# Patient Record
Sex: Female | Born: 1955
Health system: Southern US, Community
[De-identification: ages and names within clinical notes are randomized; demographics above are authoritative.]

## PROBLEM LIST (undated history)

## (undated) ENCOUNTER — Ambulatory Visit (HOSPITAL_COMMUNITY): Source: Home / Self Care

## (undated) DIAGNOSIS — R000 Tachycardia, unspecified: Secondary | ICD-10-CM

## (undated) DIAGNOSIS — Z9481 Bone marrow transplant status: Secondary | ICD-10-CM

## (undated) DIAGNOSIS — C801 Malignant (primary) neoplasm, unspecified: Secondary | ICD-10-CM

## (undated) DIAGNOSIS — E039 Hypothyroidism, unspecified: Secondary | ICD-10-CM

## (undated) DIAGNOSIS — I2510 Atherosclerotic heart disease of native coronary artery without angina pectoris: Secondary | ICD-10-CM

## (undated) DIAGNOSIS — E669 Obesity, unspecified: Secondary | ICD-10-CM

## (undated) DIAGNOSIS — Z8659 Personal history of other mental and behavioral disorders: Secondary | ICD-10-CM

## (undated) DIAGNOSIS — S83207A Unspecified tear of unspecified meniscus, current injury, left knee, initial encounter: Secondary | ICD-10-CM

## (undated) DIAGNOSIS — R0989 Other specified symptoms and signs involving the circulatory and respiratory systems: Secondary | ICD-10-CM

## (undated) DIAGNOSIS — N3010 Interstitial cystitis (chronic) without hematuria: Principal | ICD-10-CM

## (undated) DIAGNOSIS — M25561 Pain in right knee: Secondary | ICD-10-CM

## (undated) DIAGNOSIS — M899 Disorder of bone, unspecified: Secondary | ICD-10-CM

## (undated) DIAGNOSIS — M1990 Unspecified osteoarthritis, unspecified site: Secondary | ICD-10-CM

## (undated) DIAGNOSIS — R1310 Dysphagia, unspecified: Secondary | ICD-10-CM

## (undated) DIAGNOSIS — Z87828 Personal history of other (healed) physical injury and trauma: Secondary | ICD-10-CM

## (undated) DIAGNOSIS — Z853 Personal history of malignant neoplasm of breast: Secondary | ICD-10-CM

## (undated) DIAGNOSIS — T7840XA Allergy, unspecified, initial encounter: Secondary | ICD-10-CM

## (undated) DIAGNOSIS — Z9221 Personal history of antineoplastic chemotherapy: Secondary | ICD-10-CM

## (undated) DIAGNOSIS — Z923 Personal history of irradiation: Secondary | ICD-10-CM

## (undated) DIAGNOSIS — J189 Pneumonia, unspecified organism: Secondary | ICD-10-CM

## (undated) DIAGNOSIS — F444 Conversion disorder with motor symptom or deficit: Secondary | ICD-10-CM

## (undated) DIAGNOSIS — Z8719 Personal history of other diseases of the digestive system: Secondary | ICD-10-CM

## (undated) DIAGNOSIS — Z8739 Personal history of other diseases of the musculoskeletal system and connective tissue: Secondary | ICD-10-CM

## (undated) DIAGNOSIS — K047 Periapical abscess without sinus: Principal | ICD-10-CM

## (undated) DIAGNOSIS — Z860100 Personal history of colon polyps, unspecified: Secondary | ICD-10-CM

## (undated) DIAGNOSIS — M25562 Pain in left knee: Secondary | ICD-10-CM

## (undated) DIAGNOSIS — F418 Other specified anxiety disorders: Secondary | ICD-10-CM

## (undated) DIAGNOSIS — K589 Irritable bowel syndrome without diarrhea: Secondary | ICD-10-CM

## (undated) DIAGNOSIS — J45909 Unspecified asthma, uncomplicated: Secondary | ICD-10-CM

## (undated) DIAGNOSIS — Q269 Congenital malformation of great vein, unspecified: Secondary | ICD-10-CM

## (undated) DIAGNOSIS — I428 Other cardiomyopathies: Secondary | ICD-10-CM

## (undated) HISTORY — DX: Personal history of malignant neoplasm of breast: Z85.3

## (undated) HISTORY — DX: Hypothyroidism, unspecified: E03.9

## (undated) HISTORY — DX: Pain in left knee: M25.562

## (undated) HISTORY — DX: Interstitial cystitis (chronic) without hematuria: N30.10

## (undated) HISTORY — DX: Congenital malformation of great vein, unspecified: Q26.9

## (undated) HISTORY — DX: Tachycardia, unspecified: R00.0

## (undated) HISTORY — PX: BREAST EXCISIONAL BIOPSY: SUR124

## (undated) HISTORY — DX: Other specified symptoms and signs involving the circulatory and respiratory systems: R09.89

## (undated) HISTORY — DX: Other cardiomyopathies: I42.8

## (undated) HISTORY — PX: TRANSTHORACIC ECHOCARDIOGRAM: SHX275

## (undated) HISTORY — PX: ELECTROPHYSIOLOGY STUDY: SHX5802

## (undated) HISTORY — DX: Periapical abscess without sinus: K04.7

## (undated) HISTORY — DX: Obesity, unspecified: E66.9

## (undated) HISTORY — DX: Atherosclerotic heart disease of native coronary artery without angina pectoris: I25.10

## (undated) HISTORY — DX: Unspecified asthma, uncomplicated: J45.909

## (undated) HISTORY — DX: Disorder of bone, unspecified: M89.9

## (undated) HISTORY — DX: Pain in right knee: M25.561

## (undated) HISTORY — DX: Dysphagia, unspecified: R13.10

## (undated) HISTORY — DX: Other specified anxiety disorders: F41.8

## (undated) HISTORY — DX: Allergy, unspecified, initial encounter: T78.40XA

---

## 1987-06-29 HISTORY — PX: CERVICAL CONIZATION W/BX: SHX1330

## 1992-06-28 DIAGNOSIS — C50919 Malignant neoplasm of unspecified site of unspecified female breast: Secondary | ICD-10-CM

## 1992-06-28 HISTORY — PX: BREAST LUMPECTOMY: SHX2

## 1992-06-28 HISTORY — PX: KNEE ARTHROSCOPY: SUR90

## 1992-06-28 HISTORY — PX: OTHER SURGICAL HISTORY: SHX169

## 1992-06-28 HISTORY — DX: Malignant neoplasm of unspecified site of unspecified female breast: C50.919

## 1993-06-28 HISTORY — PX: BONE MARROW TRANSPLANT: SHX200

## 1997-08-21 ENCOUNTER — Ambulatory Visit (HOSPITAL_COMMUNITY): Admission: RE | Admit: 1997-08-21 | Discharge: 1997-08-21 | Payer: Self-pay | Admitting: Obstetrics and Gynecology

## 1998-01-07 ENCOUNTER — Ambulatory Visit (HOSPITAL_COMMUNITY): Admission: RE | Admit: 1998-01-07 | Discharge: 1998-01-07 | Payer: Self-pay | Admitting: Oncology

## 1998-02-10 ENCOUNTER — Ambulatory Visit (HOSPITAL_COMMUNITY): Admission: RE | Admit: 1998-02-10 | Discharge: 1998-02-10 | Payer: Self-pay | Admitting: Gastroenterology

## 1998-02-19 ENCOUNTER — Ambulatory Visit (HOSPITAL_COMMUNITY): Admission: RE | Admit: 1998-02-19 | Discharge: 1998-02-19 | Payer: Self-pay | Admitting: Obstetrics and Gynecology

## 1998-10-31 ENCOUNTER — Ambulatory Visit (HOSPITAL_COMMUNITY): Admission: RE | Admit: 1998-10-31 | Discharge: 1998-10-31 | Payer: Self-pay | Admitting: Psychology

## 1998-12-08 ENCOUNTER — Other Ambulatory Visit: Admission: RE | Admit: 1998-12-08 | Discharge: 1998-12-08 | Payer: Self-pay | Admitting: Obstetrics and Gynecology

## 1999-02-18 ENCOUNTER — Ambulatory Visit (HOSPITAL_BASED_OUTPATIENT_CLINIC_OR_DEPARTMENT_OTHER): Admission: RE | Admit: 1999-02-18 | Discharge: 1999-02-18 | Payer: Self-pay | Admitting: General Surgery

## 1999-02-18 ENCOUNTER — Encounter: Payer: Self-pay | Admitting: Gastroenterology

## 1999-08-27 ENCOUNTER — Other Ambulatory Visit: Admission: RE | Admit: 1999-08-27 | Discharge: 1999-08-27 | Payer: Self-pay | Admitting: Obstetrics and Gynecology

## 2000-01-11 ENCOUNTER — Other Ambulatory Visit: Admission: RE | Admit: 2000-01-11 | Discharge: 2000-01-11 | Payer: Self-pay | Admitting: Obstetrics and Gynecology

## 2000-09-14 ENCOUNTER — Encounter: Admission: RE | Admit: 2000-09-14 | Discharge: 2000-09-14 | Payer: Self-pay | Admitting: Internal Medicine

## 2000-09-14 ENCOUNTER — Encounter: Payer: Self-pay | Admitting: Internal Medicine

## 2000-11-16 ENCOUNTER — Ambulatory Visit (HOSPITAL_COMMUNITY): Admission: RE | Admit: 2000-11-16 | Discharge: 2000-11-16 | Payer: Self-pay | Admitting: Gastroenterology

## 2001-04-05 ENCOUNTER — Encounter: Payer: Self-pay | Admitting: Internal Medicine

## 2001-04-05 ENCOUNTER — Encounter: Admission: RE | Admit: 2001-04-05 | Discharge: 2001-04-05 | Payer: Self-pay | Admitting: Internal Medicine

## 2001-08-11 ENCOUNTER — Encounter: Admission: RE | Admit: 2001-08-11 | Discharge: 2001-08-11 | Payer: Self-pay | Admitting: Oncology

## 2001-08-11 ENCOUNTER — Encounter (HOSPITAL_COMMUNITY): Admission: RE | Admit: 2001-08-11 | Discharge: 2001-09-10 | Payer: Self-pay | Admitting: Oncology

## 2001-08-28 ENCOUNTER — Other Ambulatory Visit: Admission: RE | Admit: 2001-08-28 | Discharge: 2001-08-28 | Payer: Self-pay | Admitting: Obstetrics and Gynecology

## 2001-11-30 ENCOUNTER — Encounter: Payer: Self-pay | Admitting: Internal Medicine

## 2001-11-30 ENCOUNTER — Ambulatory Visit (HOSPITAL_COMMUNITY): Admission: RE | Admit: 2001-11-30 | Discharge: 2001-11-30 | Payer: Self-pay | Admitting: Internal Medicine

## 2002-03-06 ENCOUNTER — Emergency Department (HOSPITAL_COMMUNITY): Admission: EM | Admit: 2002-03-06 | Discharge: 2002-03-06 | Payer: Self-pay | Admitting: Emergency Medicine

## 2002-03-12 ENCOUNTER — Emergency Department (HOSPITAL_COMMUNITY): Admission: EM | Admit: 2002-03-12 | Discharge: 2002-03-12 | Payer: Self-pay | Admitting: Emergency Medicine

## 2002-04-05 ENCOUNTER — Ambulatory Visit (HOSPITAL_COMMUNITY): Admission: RE | Admit: 2002-04-05 | Discharge: 2002-04-05 | Payer: Self-pay | Admitting: Obstetrics and Gynecology

## 2002-04-05 ENCOUNTER — Encounter (INDEPENDENT_AMBULATORY_CARE_PROVIDER_SITE_OTHER): Payer: Self-pay | Admitting: Specialist

## 2002-04-05 HISTORY — PX: OTHER SURGICAL HISTORY: SHX169

## 2002-04-26 ENCOUNTER — Ambulatory Visit (HOSPITAL_COMMUNITY): Admission: RE | Admit: 2002-04-26 | Discharge: 2002-04-26 | Payer: Self-pay | Admitting: Internal Medicine

## 2002-05-07 ENCOUNTER — Ambulatory Visit (HOSPITAL_BASED_OUTPATIENT_CLINIC_OR_DEPARTMENT_OTHER): Admission: RE | Admit: 2002-05-07 | Discharge: 2002-05-07 | Payer: Self-pay | Admitting: General Surgery

## 2002-05-07 ENCOUNTER — Encounter (INDEPENDENT_AMBULATORY_CARE_PROVIDER_SITE_OTHER): Payer: Self-pay | Admitting: Specialist

## 2002-05-07 HISTORY — PX: BREAST BIOPSY: SHX20

## 2002-06-17 ENCOUNTER — Emergency Department (HOSPITAL_COMMUNITY): Admission: EM | Admit: 2002-06-17 | Discharge: 2002-06-17 | Payer: Self-pay | Admitting: Emergency Medicine

## 2002-06-17 ENCOUNTER — Encounter: Payer: Self-pay | Admitting: Emergency Medicine

## 2002-07-19 ENCOUNTER — Ambulatory Visit (HOSPITAL_COMMUNITY): Admission: RE | Admit: 2002-07-19 | Discharge: 2002-07-19 | Payer: Self-pay | Admitting: Obstetrics and Gynecology

## 2002-07-19 ENCOUNTER — Encounter: Payer: Self-pay | Admitting: Obstetrics and Gynecology

## 2002-08-20 ENCOUNTER — Encounter: Admission: RE | Admit: 2002-08-20 | Discharge: 2002-08-20 | Payer: Self-pay | Admitting: Oncology

## 2002-08-20 ENCOUNTER — Encounter (HOSPITAL_COMMUNITY): Admission: RE | Admit: 2002-08-20 | Discharge: 2002-09-19 | Payer: Self-pay | Admitting: Oncology

## 2003-04-24 ENCOUNTER — Encounter: Admission: RE | Admit: 2003-04-24 | Discharge: 2003-04-24 | Payer: Self-pay | Admitting: Oncology

## 2003-04-24 ENCOUNTER — Encounter (HOSPITAL_COMMUNITY): Admission: RE | Admit: 2003-04-24 | Discharge: 2003-05-24 | Payer: Self-pay | Admitting: Oncology

## 2003-06-20 ENCOUNTER — Ambulatory Visit (HOSPITAL_COMMUNITY): Admission: RE | Admit: 2003-06-20 | Discharge: 2003-06-20 | Payer: Self-pay | Admitting: Neurology

## 2003-06-30 ENCOUNTER — Emergency Department (HOSPITAL_COMMUNITY): Admission: EM | Admit: 2003-06-30 | Discharge: 2003-06-30 | Payer: Self-pay | Admitting: Emergency Medicine

## 2003-07-10 ENCOUNTER — Ambulatory Visit (HOSPITAL_COMMUNITY): Admission: RE | Admit: 2003-07-10 | Discharge: 2003-07-10 | Payer: Self-pay | Admitting: Oncology

## 2003-07-22 ENCOUNTER — Other Ambulatory Visit: Admission: RE | Admit: 2003-07-22 | Discharge: 2003-07-22 | Payer: Self-pay | Admitting: Obstetrics and Gynecology

## 2003-08-05 ENCOUNTER — Encounter (INDEPENDENT_AMBULATORY_CARE_PROVIDER_SITE_OTHER): Payer: Self-pay | Admitting: Specialist

## 2003-08-05 ENCOUNTER — Ambulatory Visit (HOSPITAL_COMMUNITY): Admission: RE | Admit: 2003-08-05 | Discharge: 2003-08-05 | Payer: Self-pay | Admitting: Gastroenterology

## 2003-08-13 ENCOUNTER — Other Ambulatory Visit: Admission: RE | Admit: 2003-08-13 | Discharge: 2003-08-13 | Payer: Self-pay | Admitting: *Deleted

## 2003-08-28 ENCOUNTER — Encounter (HOSPITAL_COMMUNITY): Admission: RE | Admit: 2003-08-28 | Discharge: 2003-09-27 | Payer: Self-pay | Admitting: Oncology

## 2003-08-28 ENCOUNTER — Encounter: Admission: RE | Admit: 2003-08-28 | Discharge: 2003-08-28 | Payer: Self-pay | Admitting: Oncology

## 2004-04-06 ENCOUNTER — Encounter: Admission: RE | Admit: 2004-04-06 | Discharge: 2004-04-06 | Payer: Self-pay | Admitting: Oncology

## 2004-04-06 ENCOUNTER — Encounter (HOSPITAL_COMMUNITY): Admission: RE | Admit: 2004-04-06 | Discharge: 2004-05-06 | Payer: Self-pay | Admitting: Oncology

## 2004-04-13 ENCOUNTER — Ambulatory Visit (HOSPITAL_COMMUNITY): Admission: RE | Admit: 2004-04-13 | Discharge: 2004-04-13 | Payer: Self-pay | Admitting: Oncology

## 2004-04-16 ENCOUNTER — Other Ambulatory Visit: Admission: RE | Admit: 2004-04-16 | Discharge: 2004-04-16 | Payer: Self-pay | Admitting: General Surgery

## 2004-11-10 ENCOUNTER — Encounter: Admission: RE | Admit: 2004-11-10 | Discharge: 2004-11-10 | Payer: Self-pay | Admitting: Family Medicine

## 2004-12-01 ENCOUNTER — Ambulatory Visit (HOSPITAL_COMMUNITY): Admission: RE | Admit: 2004-12-01 | Discharge: 2004-12-01 | Payer: Self-pay | Admitting: Neurology

## 2004-12-25 ENCOUNTER — Ambulatory Visit (HOSPITAL_COMMUNITY): Admission: RE | Admit: 2004-12-25 | Discharge: 2004-12-25 | Payer: Self-pay | Admitting: Neurology

## 2005-04-22 ENCOUNTER — Ambulatory Visit (HOSPITAL_COMMUNITY): Admission: RE | Admit: 2005-04-22 | Discharge: 2005-04-22 | Payer: Self-pay | Admitting: Neurology

## 2005-08-26 ENCOUNTER — Encounter: Admission: RE | Admit: 2005-08-26 | Discharge: 2005-08-26 | Payer: Self-pay | Admitting: Family Medicine

## 2005-09-28 ENCOUNTER — Ambulatory Visit (HOSPITAL_COMMUNITY): Payer: Self-pay | Admitting: Oncology

## 2005-09-28 ENCOUNTER — Encounter: Admission: RE | Admit: 2005-09-28 | Discharge: 2005-09-28 | Payer: Self-pay | Admitting: Oncology

## 2005-09-28 ENCOUNTER — Encounter (HOSPITAL_COMMUNITY): Admission: RE | Admit: 2005-09-28 | Discharge: 2005-10-28 | Payer: Self-pay | Admitting: Oncology

## 2005-10-26 ENCOUNTER — Emergency Department: Payer: Self-pay | Admitting: Emergency Medicine

## 2005-10-27 ENCOUNTER — Ambulatory Visit: Payer: Self-pay | Admitting: Oncology

## 2006-01-03 ENCOUNTER — Ambulatory Visit: Payer: Self-pay | Admitting: Hematology and Oncology

## 2006-01-28 ENCOUNTER — Ambulatory Visit: Payer: Self-pay | Admitting: Family Medicine

## 2006-07-12 ENCOUNTER — Ambulatory Visit: Payer: Self-pay | Admitting: Family Medicine

## 2006-07-13 ENCOUNTER — Encounter: Admission: RE | Admit: 2006-07-13 | Discharge: 2006-07-13 | Payer: Self-pay | Admitting: Family Medicine

## 2006-07-24 ENCOUNTER — Encounter: Admission: RE | Admit: 2006-07-24 | Discharge: 2006-07-24 | Payer: Self-pay | Admitting: Neurology

## 2006-10-04 ENCOUNTER — Ambulatory Visit (HOSPITAL_COMMUNITY): Payer: Self-pay | Admitting: Oncology

## 2007-04-06 ENCOUNTER — Ambulatory Visit: Payer: Self-pay | Admitting: Family Medicine

## 2007-04-10 ENCOUNTER — Emergency Department (HOSPITAL_COMMUNITY): Admission: EM | Admit: 2007-04-10 | Discharge: 2007-04-10 | Payer: Self-pay | Admitting: Emergency Medicine

## 2007-04-14 ENCOUNTER — Ambulatory Visit: Payer: Self-pay | Admitting: Family Medicine

## 2007-04-28 ENCOUNTER — Ambulatory Visit: Payer: Self-pay | Admitting: Family Medicine

## 2007-06-26 ENCOUNTER — Ambulatory Visit: Payer: Self-pay | Admitting: Family Medicine

## 2007-12-12 ENCOUNTER — Ambulatory Visit: Payer: Self-pay | Admitting: Family Medicine

## 2007-12-25 ENCOUNTER — Encounter (HOSPITAL_COMMUNITY): Admission: RE | Admit: 2007-12-25 | Discharge: 2008-01-24 | Payer: Self-pay | Admitting: Oncology

## 2007-12-25 ENCOUNTER — Ambulatory Visit (HOSPITAL_COMMUNITY): Payer: Self-pay | Admitting: Oncology

## 2008-01-01 ENCOUNTER — Encounter: Admission: RE | Admit: 2008-01-01 | Discharge: 2008-01-01 | Payer: Self-pay | Admitting: Gastroenterology

## 2008-01-02 ENCOUNTER — Other Ambulatory Visit: Admission: RE | Admit: 2008-01-02 | Discharge: 2008-01-02 | Payer: Self-pay | Admitting: Radiology

## 2008-01-03 ENCOUNTER — Encounter: Admission: RE | Admit: 2008-01-03 | Discharge: 2008-01-03 | Payer: Self-pay | Admitting: Obstetrics and Gynecology

## 2008-01-05 ENCOUNTER — Ambulatory Visit (HOSPITAL_COMMUNITY): Admission: RE | Admit: 2008-01-05 | Discharge: 2008-01-05 | Payer: Self-pay | Admitting: Family Medicine

## 2008-01-15 ENCOUNTER — Ambulatory Visit: Payer: Self-pay | Admitting: Family Medicine

## 2008-01-22 ENCOUNTER — Ambulatory Visit: Payer: Self-pay | Admitting: Family Medicine

## 2008-03-19 ENCOUNTER — Ambulatory Visit: Payer: Self-pay | Admitting: Family Medicine

## 2008-04-01 ENCOUNTER — Ambulatory Visit: Payer: Self-pay | Admitting: Family Medicine

## 2008-04-18 ENCOUNTER — Ambulatory Visit: Payer: Self-pay | Admitting: Family Medicine

## 2008-04-22 ENCOUNTER — Encounter: Payer: Self-pay | Admitting: Emergency Medicine

## 2008-04-22 ENCOUNTER — Ambulatory Visit (HOSPITAL_COMMUNITY): Admission: RE | Admit: 2008-04-22 | Discharge: 2008-04-22 | Payer: Self-pay | Admitting: Cardiology

## 2008-04-22 LAB — PULMONARY FUNCTION TEST

## 2008-05-31 ENCOUNTER — Ambulatory Visit: Payer: Self-pay | Admitting: Emergency Medicine

## 2008-05-31 DIAGNOSIS — J309 Allergic rhinitis, unspecified: Secondary | ICD-10-CM | POA: Insufficient documentation

## 2008-05-31 DIAGNOSIS — Z853 Personal history of malignant neoplasm of breast: Secondary | ICD-10-CM | POA: Insufficient documentation

## 2008-05-31 DIAGNOSIS — F411 Generalized anxiety disorder: Secondary | ICD-10-CM | POA: Insufficient documentation

## 2008-06-24 ENCOUNTER — Ambulatory Visit: Payer: Self-pay | Admitting: Emergency Medicine

## 2008-06-24 DIAGNOSIS — R0602 Shortness of breath: Secondary | ICD-10-CM | POA: Insufficient documentation

## 2008-10-07 ENCOUNTER — Ambulatory Visit: Payer: Self-pay | Admitting: Emergency Medicine

## 2008-11-04 ENCOUNTER — Encounter: Admission: RE | Admit: 2008-11-04 | Discharge: 2008-11-04 | Payer: Self-pay | Admitting: Neurology

## 2008-12-13 ENCOUNTER — Ambulatory Visit: Payer: Self-pay | Admitting: Family Medicine

## 2008-12-22 ENCOUNTER — Emergency Department (HOSPITAL_COMMUNITY): Admission: EM | Admit: 2008-12-22 | Discharge: 2008-12-22 | Payer: Self-pay | Admitting: Emergency Medicine

## 2009-01-02 ENCOUNTER — Encounter (HOSPITAL_COMMUNITY): Admission: RE | Admit: 2009-01-02 | Discharge: 2009-02-01 | Payer: Self-pay | Admitting: Oncology

## 2009-01-02 ENCOUNTER — Ambulatory Visit (HOSPITAL_COMMUNITY): Payer: Self-pay | Admitting: Oncology

## 2009-06-26 ENCOUNTER — Encounter: Admission: RE | Admit: 2009-06-26 | Discharge: 2009-06-26 | Payer: Self-pay | Admitting: Internal Medicine

## 2009-12-22 ENCOUNTER — Encounter: Admission: RE | Admit: 2009-12-22 | Discharge: 2009-12-22 | Payer: Self-pay | Admitting: Radiology

## 2010-01-28 ENCOUNTER — Encounter: Admission: RE | Admit: 2010-01-28 | Discharge: 2010-01-28 | Payer: Self-pay | Admitting: Obstetrics and Gynecology

## 2010-02-03 ENCOUNTER — Ambulatory Visit (HOSPITAL_COMMUNITY): Payer: Self-pay | Admitting: Oncology

## 2010-02-13 ENCOUNTER — Encounter: Admission: RE | Admit: 2010-02-13 | Discharge: 2010-02-13 | Payer: Self-pay | Admitting: Oncology

## 2010-02-27 ENCOUNTER — Encounter: Admission: RE | Admit: 2010-02-27 | Discharge: 2010-02-27 | Payer: Self-pay | Admitting: Oncology

## 2010-04-09 ENCOUNTER — Encounter: Admission: RE | Admit: 2010-04-09 | Discharge: 2010-04-09 | Payer: Self-pay | Admitting: General Surgery

## 2010-07-19 ENCOUNTER — Encounter: Payer: Self-pay | Admitting: Family Medicine

## 2010-07-20 ENCOUNTER — Encounter: Payer: Self-pay | Admitting: Gastroenterology

## 2010-07-20 ENCOUNTER — Encounter: Payer: Self-pay | Admitting: Obstetrics and Gynecology

## 2010-07-28 NOTE — Assessment & Plan Note (Signed)
Summary: dyspnea, abnormal DLCO   Visit Type:  Initial Consult Referred by:  Dr Roger Shelter PCP:  Ivery Quale  Chief Complaint:  Consult abnormal PFT's .  History of Present Illness: 55 yo woman, hx breast CA s/p auto-stem cell tx '95 without recurrance ([facility]). Course was c/b pulmonary toxicity - was told that her CT's and PFT's were abnormal. Has had some dyspnea for the last 15 yrs since the transplant. Began to see Dr Deborah Chalk about 5 yrs ago for tachycardia, referred back to him for CP that could happen at random. Some episodic nausea, emesis as well that didn't always correspond with CP. Underwent stress test that was reassuring. Also some exertional dyspnea. Over the last 6 months has noticed episodes of inability to do housework, difficulty with speaking. She does note that her exercise routine has ben interrupted, hasn't been able to work out for a few months. Now back to the gym. No cough, no wheeze.   PFT's done 04/22/08 as below.   Hilbert Bible     Prior Medications Reviewed Using: Patient Recall  Updated Prior Medication List: SYNTHROID 75 MCG TABS (LEVOTHYROXINE SODIUM) Take 1 tablet by mouth once a day CELEXA 20 MG TABS (CITALOPRAM HYDROBROMIDE) Take 1 1/2 tablets once daily TRAZODONE HCL 50 MG TABS (TRAZODONE HCL) Take 1 tab by mouth at bedtime ALPRAZOLAM 0.25 MG TABS (ALPRAZOLAM) as needed HYDROCODONE-ACETAMINOPHEN 7.5-500 MG TABS (HYDROCODONE-ACETAMINOPHEN) as needed  Current Allergies (reviewed today): No known allergies   Past Medical History:    Breast cancer, hx of    Allergic Rhinitis    Tremor, pseudo MS    Anxiety  Past Surgical History:    Mastectomy, partial 1994    Bone marrow transplant 01/95   Family History:    mother-COPD, heart diseasedeceased    mat grandmother-deceased TB, breast CA    father-allergies, heart disease, CVA    sister-allergies    sister-DVT  Social History:    Patient never smoked. Many years ago would have an  occasional ciagerette after dinner 1-2 times weekly.     Pt is married with children.    Pt is a homemaker.   Risk Factors:  Tobacco use:  never    Vital Signs:  Patient Profile:   55 Years Old Female Weight:      159.38 pounds O2 Sat:      95 % O2 treatment:    Room Air Temp:     98.1 degrees F oral Pulse rate:   67 / minute BP sitting:   110 / 74  (left arm) Cuff size:   regular  Vitals Entered By: Cloyde Reams RN (May 31, 2008 10:56 AM)             Comments Pt is here today for a new pt consult for abnormal PFT's.  Referred to [place] by Dr. Deborah Chalk.  Had PFT's done at [facility] 09/09. Increased SOB x 6 mos.  Pt had high does of chemo for breast CA 15 yrs ago, had pulmonary toxicity r/t. Medications reviewed Cloyde Reams RN  May 31, 2008 10:59 AM      Physical Exam  General:     well developed, well nourished, in no acute distress Head:     normocephalic and atraumatic Eyes:     conjunctiva and sclera clear Nose:     no deformity, discharge, inflammation, or lesions Mouth:     no deformity or lesions Neck:     no masses, thyromegaly, or abnormal  cervical nodes Lungs:     clear bilaterally to auscultation, no crackles Heart:     regular rate and rhythm, S1, S2 without murmurs, rubs, gallops, or clicks Abdomen:     not examined Msk:     no deformity or scoliosis noted with normal posture Extremities:     no clubbing, cyanosis, edema, or deformity noted Neurologic:     non-focal Skin:     intact without lesions or rashes Psych:     alert and cooperative; normal mood and affect; normal attention span and concentration    Pulmonary Function Test Date: 04/22/2008 Gender: Female  Pre-Spirometry FVC    Value: 3.12 L/min   Pred: 3.43 L/min     % Pred: 91 % FEV1    Value: 2.46 L     Pred: 2.60 L     % Pred: 95 % FEV1/FVC  Value: 79 %     Pred: 80 %     % Pred: - % FEF 25-75  Value: 2.27 L/min   Pred: 2.94 L/min     % Pred: 77 %  Lung Volumes RV     Value: 1.07 L   % Pred: 94 % DLCO    Value: 25.94 %   % Pred: 53 % DLCO/VA  Value: 3.38 %   % Pred: 68 %  Comments: Probable mixed disease vs mild AFL. Low DLCO that does not fully correct for alveolar volume. RSB    Impression & Recommendations:  Problem # 1:  DYSPNEA (ICD-786.05) Suspect component of deconditioning off her exercise regimen, but need to r/o any progression  - full PFT's with volumes - compare PFT's and radiology from [facility] - continue the exercise routine - f/u next available to review data Orders: Consultation Level IV (47829)   Problem # 2:  BREAST CANCER, HX OF (ICD-V10.3)  Medications Added to Medication List This Visit: 1)  Synthroid 75 Mcg Tabs (Levothyroxine sodium) .... Take 1 tablet by mouth once a day 2)  Celexa 20 Mg Tabs (Citalopram hydrobromide) .... Take 1 1/2 tablets once daily 3)  Trazodone Hcl 50 Mg Tabs (Trazodone hcl) .... Take 1 tab by mouth at bedtime 4)  Alprazolam 0.25 Mg Tabs (Alprazolam) .... As needed 5)  Hydrocodone-acetaminophen 7.5-500 Mg Tabs (Hydrocodone-acetaminophen) .... As needed   Patient Instructions: 1)  We will obtain copies of your PFT's and films from [facility] to compare with current testing.  2)  Return to see Dr Delton Coombes with Full PFT's at next available appointment.    ]

## 2010-07-28 NOTE — Assessment & Plan Note (Signed)
Summary: dyspnea   Visit Type:  Follow-up Copy to:  Dr Roger Shelter Primary Provider/Referring Provider:  Ivery Quale  CC:  fu visit....SOB has increased....yellow sputum taking z pak starting on Friday...Marland Kitchenheadaches.  History of Present Illness: 55 yo woman, hx breast CA s/p auto-stem cell tx '95 without recurrance ([facility]). Course was c/b pulmonary toxicity - abnormal  CT's and PFT's. Has had some dyspnea for the last 15 yrs since the transplant. Began to see Dr Deborah Chalk about 5 yrs ago for tachycardia, referred back to him for CP that could happen at random. Underwent cardiac stress test that was reassuring.   Has been dealing with some exertional dyspnea 12/09. Over the last 6 months has noticed episodes of inability to do housework, difficulty with speaking. She does note that her exercise routine had been interrupted, now back to the gym. No cough, no wheeze. PFT's with mild restriction, DLCO that corrects to normal for VA.   10/07/08 - went to see Dr Jarold Motto for allergies, yellow nasal discharge. Dry cough.Treated with azithro with improvement, no purulent sputum. CXR done 10/04/08 as below. Has also had more longstanding exertional SOB, also c/o muscle and joint aches - shoulders and R arm, not always with exertion.    Current Medications (verified): 1)  Synthroid 75 Mcg Tabs (Levothyroxine Sodium) .... Take 1 Tablet By Mouth Once A Day 2)  Wellbutrin Sr 100 Mg Xr12h-Tab (Bupropion Hcl) .... Once Daily 3)  Trazodone Hcl 50 Mg Tabs (Trazodone Hcl) .... Take 1 Tab By Mouth At Bedtime 4)  Alprazolam 0.25 Mg Tabs (Alprazolam) .... As Needed 5)  Hydrocodone-Acetaminophen 5-325 Mg Tabs (Hydrocodone-Acetaminophen) .... As Needed  Allergies (verified): No Known Drug Allergies  Vital Signs:  Patient profile:   55 year old female Height:      66 inches Weight:      160 pounds BMI:     25.92 O2 Sat:      96 % Temp:     98.4 degrees F oral Pulse rate:   90 / minute BP sitting:   90  / 60  (left arm) Cuff size:   regular  Vitals Entered By: Clarise Cruz Duncan Dull) (October 07, 2008 10:38 AM)  Physical Exam  General:  well developed, well nourished, in no acute distress Head:  normocephalic and atraumatic Eyes:  conjunctiva and sclera clear Nose:  no deformity, discharge, inflammation, or lesions Mouth:  no deformity or lesions Neck:  no masses, thyromegaly, or abnormal cervical nodes Lungs:  clear bilaterally to auscultation, no crackles Heart:  regular rate and rhythm, S1, S2 without murmurs, rubs, gallops, or clicks Abdomen:  not examined Msk:  no deformity or scoliosis noted with normal posture Extremities:  no clubbing, cyanosis, edema, or deformity noted Neurologic:  non-focal Skin:  intact without lesions or rashes Psych:  alert and cooperative; slightly depressed affect; normal attention span and concentration   Impression & Recommendations:  Problem # 1:  DYSPNEA (ICD-786.05)  Likely due to pulm toxicity from prior chemo + deconditioning. PFT's have shown mild restriction. No desat on walking in 12/09. CXR with stable RLL chronic changes compared with 08/2005.  - no new PFt at this time - follow symptoms and repeat CXR and PFT, walking oximetry if she changes. At this point, she shoulod be out of danger with regard to worsening interstitial disease from chemo done 10 yrs ago.  - f/u 1 yr or as needed.   Orders: Est. Patient Level III (16109)  Problem # 2:  ALLERGIC  RHINITIS (ICD-477.9)  Medications Added to Medication List This Visit: 1)  Wellbutrin Sr 100 Mg Xr12h-tab (Bupropion hcl) .... Once daily 2)  Hydrocodone-acetaminophen 5-325 Mg Tabs (Hydrocodone-acetaminophen) .... As needed  Other Orders: T-2 View CXR, Same Day (71020.5TC)  Patient Instructions: 1)  Your CXR is stable compared with 2007.  2)  We will not repeat PFT's at this time, but may consider this if your breathing changes.  3)  Follow up with Dr Delton Coombes in 1 year or as needed.

## 2010-07-28 NOTE — Miscellaneous (Signed)
Summary: Orders Update pft charges  Clinical Lists Changes  Orders: Added new Service order of Carbon Monoxide diffusing w/capacity (94720) - Signed Added new Service order of Lung Volumes (94240) - Signed Added new Service order of Spirometry (Pre & Post) (94060) - Signed 

## 2010-07-28 NOTE — Assessment & Plan Note (Signed)
Summary: dyspnea   Visit Type:  Follow-up Referred by:  Dr Roger Shelter PCP:  Ivery Quale  Chief Complaint:  Dyspnea.  History of Present Illness: 55 yo woman, hx breast CA s/p auto-stem cell tx '95 without recurrance ([facility]). Course was c/b pulmonary toxicity - was told that her CT's and PFT's were abnormal. Has had some dyspnea for the last 15 yrs since the transplant. Began to see Dr Deborah Chalk about 5 yrs ago for tachycardia, referred back to him for CP that could happen at random. Some episodic nausea, emesis as well that didn't always correspond with CP. Underwent cardiac stress test that was reassuring.   Has been dealing with some exertional dyspnea since last visit. Over the last 6 months has noticed episodes of inability to do housework, difficulty with speaking. She does note that her exercise routine had been interrupted, now back to the gym. No cough, no wheeze.  Full PFT's repeated since last visit as below      Prior Medications Reviewed Using: Patient Recall  Updated Prior Medication List: SYNTHROID 75 MCG TABS (LEVOTHYROXINE SODIUM) Take 1 tablet by mouth once a day CELEXA 20 MG TABS (CITALOPRAM HYDROBROMIDE) Take 1/2 tablet once daily TRAZODONE HCL 50 MG TABS (TRAZODONE HCL) Take 1 tab by mouth at bedtime ALPRAZOLAM 0.25 MG TABS (ALPRAZOLAM) as needed HYDROCODONE-ACETAMINOPHEN 7.5-500 MG TABS (HYDROCODONE-ACETAMINOPHEN) as needed  Current Allergies (reviewed today): No known allergies       Vital Signs:  Patient Profile:   55 Years Old Female Height:     66 inches Weight:      156 pounds O2 Sat:      96 % O2 treatment:    Room Air Temp:     98.0 degrees F oral Pulse rate:   80 / minute BP sitting:   102 / 60  (left arm) Cuff size:   regular  Vitals Entered By: Cloyde Reams RN (June 24, 2008 3:56 PM)             Comments Pt is here today for a follow-up visit with PFT's.  Pt states breathing is about the same. Pt can have no needle sticks  or BP's in R arm.  Medications reviewed Cloyde Reams RN  June 24, 2008 3:56 PM      Serial Vital Signs/Assessments:  Comments: 4:56 PM Ambulatory Pulse Oximetry  Resting; HR_82____    02 Sat__99%RA___  Lap1 (185 feet)   HR_89____   02 Sat__93%RA___ Lap2 (185 feet)   HR__94___   02 Sat__91%RA___    Lap3 (185 feet)   HR___110__   02 Sat__88%RA___  _X__Test Completed without Difficulty ___Test Stopped due to:  By: Cloyde Reams RN    Physical Exam  General:     well developed, well nourished, in no acute distress Head:     normocephalic and atraumatic Eyes:     conjunctiva and sclera clear Nose:     no deformity, discharge, inflammation, or lesions Mouth:     no deformity or lesions Neck:     no masses, thyromegaly, or abnormal cervical nodes Lungs:     clear bilaterally to auscultation, no crackles Heart:     regular rate and rhythm, S1, S2 without murmurs, rubs, gallops, or clicks Abdomen:     not examined Msk:     no deformity or scoliosis noted with normal posture Extremities:     no clubbing, cyanosis, edema, or deformity noted Neurologic:     non-focal Skin:     intact  without lesions or rashes Psych:     alert and cooperative; normal mood and affect; normal attention span and concentration    Pulmonary Function Test Date: 06/24/2008 Height (in.): 66 Gender: Female  Pre-Spirometry FVC    Value: 3.27 L/min   Pred: 3.51 L/min     % Pred: 93 % FEV1    Value: 2.54 L     Pred: 2.67 L     % Pred: 95 % FEV1/FVC  Value: 78 %     Pred: 75 %     % Pred: - % FEF 25-75  Value: 2.29 L/min   Pred: 2.98 L/min     % Pred: 77 %  Post-Spirometry FVC    Value: 3.21 L/min   Pred: 3.51 L/min     % Pred: 91 % FEV1    Value: 2.57 L     Pred: 2.67 L     % Pred: 96 % FEV1/FVC  Value: 80 %     Pred: 75 %     % Pred: - % FEF 25-75  Value: 2.49 L/min   Pred: 2.98 L/min     % Pred: 83 %  Lung Volumes TLC    Value: 4.53 L   % Pred: 84 % RV    Value: 1.26 L   %  Pred: 66 % DLCO    Value: 13.5 %   % Pred: 60 % DLCO/VA  Value: 3.20 %   % Pred: 81 %  Comments: Normal airflows, no response to BD. Low RV suggests mild restriction. Decreased DLCO that corrects to normal range when adjusted for alveolar volume. RSB    Impression & Recommendations:  Problem # 1:  DYSPNEA (ICD-786.05) Suspect some deconditioning superimposed on her pulmonary toxicity from prior chemotherapy. PFT from [facility] not yet available, but PFT's today show mild restriction, decreased DLCO that corrects to normal range. Desat to 88% with exertion today. - continue exercise regimen - repeat PFT and CXR if dyspnea worsens - will call her with PFT's from [facility] when they are available  Problem # 2:  BREAST CANCER, HX OF (ICD-V10.3)  Medications Added to Medication List This Visit: 1)  Celexa 20 Mg Tabs (Citalopram hydrobromide) .... Take 1/2 tablet once daily   Patient Instructions: 1)  Your pulmonary functions testing shows decreased diffusion capacity, that corrects into the normal range when adjusted for inhaled volume. 2)  Walking oximetry today showed that your oxygen level does not drop significantly with exertion. 3)  You may continue your regular exercise regimen. 4)  If you develop progression of shortness of breath I would like to repeat your CXR and PFT's 5)  We will obtain your records from [facility] to compare your PFT's to prior.    ]

## 2010-07-31 ENCOUNTER — Telehealth (INDEPENDENT_AMBULATORY_CARE_PROVIDER_SITE_OTHER): Payer: Self-pay | Admitting: *Deleted

## 2010-08-05 NOTE — Progress Notes (Signed)
  Phone Note Other Incoming   Request: Send information Summary of Call: Request for records received from [facility]. Request forwarded to [facility].  2003

## 2010-09-23 ENCOUNTER — Other Ambulatory Visit: Payer: Self-pay | Admitting: Obstetrics and Gynecology

## 2010-10-04 LAB — COMPREHENSIVE METABOLIC PANEL
ALT: 19 U/L (ref 0–35)
AST: 25 U/L (ref 0–37)
Albumin: 3.9 g/dL (ref 3.5–5.2)
Alkaline Phosphatase: 73 U/L (ref 39–117)
BUN: 19 mg/dL (ref 6–23)
CO2: 29 mEq/L (ref 19–32)
Calcium: 9.2 mg/dL (ref 8.4–10.5)
Chloride: 99 mEq/L (ref 96–112)
Creatinine, Ser: 0.8 mg/dL (ref 0.4–1.2)
GFR calc Af Amer: >60 mL/min (ref >60)
GFR calc non Af Amer: >60 mL/min (ref >60)
Glucose, Bld: 88 mg/dL (ref 70–99)
Potassium: 3.7 mEq/L (ref 3.5–5.1)
Sodium: 135 mEq/L (ref 135–145)
Total Bilirubin: 0.5 mg/dL (ref 0.3–1.2)
Total Protein: 6.5 g/dL (ref 6.0–8.3)

## 2010-10-04 LAB — CBC
HCT: 40.9 % (ref 36.0–46.0)
Hemoglobin: 14.1 g/dL (ref 12.0–15.0)
MCHC: 34.4 g/dL (ref 30.0–36.0)
MCV: 98.2 fL (ref 78.0–100.0)
Platelets: 218 10*3/uL (ref 150–400)
RBC: 4.16 MIL/uL (ref 3.87–5.11)
RDW: 13.1 % (ref 11.5–15.5)
WBC: 6.9 10*3/uL (ref 4.0–10.5)

## 2010-11-11 ENCOUNTER — Other Ambulatory Visit: Payer: Self-pay | Admitting: Obstetrics & Gynecology

## 2010-11-11 DIAGNOSIS — Z9889 Other specified postprocedural states: Secondary | ICD-10-CM

## 2010-11-13 NOTE — Op Note (Signed)
NAMECHRISTABELLE, [name]                         ACCOUNT NO.:  [id number]   MEDICAL RECORD NO.:  [id number]                   PATIENT TYPE:  OIB   LOCATION:  2899                                 FACILITY:  [facility]   PHYSICIAN:  Doylene Canning. Ladona Ridgel, M.D. [facility]           DATE OF BIRTH:  [birth year]   DATE OF PROCEDURE:  04/26/2002  DATE OF DISCHARGE:                                 OPERATIVE REPORT   PROCEDURE:  Invasive electrophysiologic testing.   INDICATIONS:  Recurrent drug-refractory tachycardia.   CLINICAL NOTE:  The patient is a very pleasant 55 year old woman with a  history of tachypalpitations and documented narrow QRS tachycardia with a  long RP interval.  While her rhythm strips look like a high right atrial  tachycardia or sinus tachycardia, her episodes start suddenly and stop  gradually.  She is now referred because of recurrent symptoms for additional  evaluation with electrophysiologic evaluation.   DESCRIPTION OF PROCEDURE:  After informed consent was obtained, the patient  was taken to the diagnostic EP lab in a fasted state.  After the usual  preparation and draping, intravenous fentanyl and midazolam were given for  sedation.  A 6 French hexapolar catheter was inserted percutaneously in the  right jugular vein and advanced to the coronary sinus.  A 5 French  quadripolar catheter was inserted percutaneously in the right femoral vein  and advanced to the RV apex.  A 5 French quadripolar catheter was inserted  percutaneously in the right femoral vein and advanced to the His bundle  region.  After measurement of the basic intervals, rapid ventricular pacing  was carried out from the RV apex at a pacing cycle length of 600 msec and  stepwise decreased down to 535 msec, where VA Wenckebach was observed.  During rapid ventricular pacing, the atrial activation sequence was midline  and decremental.  Next, programmed ventricular stimulation was carried out  from the RV apex at a  basic drive cycle length of 161 msec.  The S1-S2  interval was stepwise decreased from 540 msec down to 340 msec, where the  retrograde AV node ERP was observed.  During programmed ventricular  stimulation, the atrial activation sequence was again midline and  decremental.  Next programmed atrial stimulation was carried out from the  coronary sinus at a basic drive cycle length of 096 msec.  The S1-S2  interval was stepwise decreased from 540 msec down to 260 msec, where the AV  node ERP was observed.  During programmed atrial stimulation there were no  AH jumps and no echo beats and no SVT.  Next rapid atrial pacing was carried  out from the coronary sinus at a pacing cycle length of 590 msec and  stepwise decreased down to 310 msec, where AV Wenckebach was observed.  During rapid atrial pacing the PR interval remained less than the RR  interval, and there was no inducible SVT.  At  this point isoproterenol was  initiated at doses ranging from 0.8 to 2 mcg/min. (10-30 cc/hr.), and  additional rapid atrial and ventricular pacing was carried out.  In  addition, programmed atrial stimulation was carried out.  During rapid  atrial and ventricular pacing as well as programmed atrial stimulation,  there was no evidence of accessory pathway, no evidence of any dual AV nodal  physiology, and no inducible SVT.  The catheters were then removed,  hemostasis was assured, and the patient was returned to her room in  satisfactory condition.   COMPLICATIONS:  There were no immediate procedural complications.   RESULTS:  A.  BASELINE ELECTROCARDIOGRAM:  The baseline ECG demonstrates normal sinus  rhythm with normal axis and intervals.   B.  BASELINE INTERVALS:  The sinus node cycle length was 1040 msec.  The AH  interval was 54 msec.  The HV interval was 36 msec.  The QRS duration was  100 msec.   C.  RAPID VENTRICULAR PACING:  Rapid ventricular pacing from the RV apex  demonstrated a VA Wenckebach  cycle length of 530 msec.  During rapid  ventricular pacing the atrial activation sequence was midline and  decremental.  There was no inducible VT or SVT during rapid ventricular  pacing.   D.  PROGRAMMED VENTRICULAR STIMULATION:  Programmed ventricular stimulation  was carried out from the RV apex at a basic drive cycle length of 161 msec.  The S1-S2 interval was stepwise decreased from 540 msec down to 340 msec,  where the retrograde AV node ERP was observed.  During programmed  ventricular stimulation there was no inducible SVT, and the atrial  activation was midline and decremental.   E.  RAPID ATRIAL PACING:  Rapid atrial pacing was carried out from the  coronary sinus at a pacing cycle length of 590 msec and stepwise decreased  down to 310 msec, where the AV Wenckebach cycle length was observed.  During  rapid atrial pacing the PR interval remained less than the RR interval.  There was no inducible SVT.   F.  PROGRAMMED ATRIAL STIMULATION:  Programmed atrial stimulation was  carried out from the coronary sinus at a basic drive cycle length of 096  msec.  The S1-S2 interval was stepwise decreased down to 260 msec, where the  AV node ERP was observed.  During programmed atrial stimulation there were  no AH jumps and no echo beats and no inducible SVT.   G.  ISOPROTERENOL INFUSION:  Isoproterenol was infused at rates of 0.8 to 2  mcg/min.  During isoproterenol infusion there was no inducible SVT.   CONCLUSION:  This study failed to demonstrate any evidence of dual AV nodal  physiology, accessory pathway conduction, or atrial tachycardia despite an  aggressive programmed stimulation protocol and rapid atrial pacing.  There  were no inducible arrhythmias.                                               Doylene Canning. Ladona Ridgel, M.D. [facility]    GWT/MEDQ  D:  04/26/2002  T:  04/26/2002  Job:  [id number]   cc:   Colleen Can. Deborah Chalk, M.D.  [address], [address]  [place]  [place] [address] Fax:  [phone]   Ladona Horns. Neijstrom, M.D.  [address]  [place]  [place] [address]  Fax: [phone]  Kathrine Cords, [facility]

## 2010-11-13 NOTE — Op Note (Signed)
   [name], [name]                         ACCOUNT NO.:  [id number]   MEDICAL RECORD NO.:  [id number]                   PATIENT TYPE:  AMB   LOCATION:  DSC                                  FACILITY:  [facility]   PHYSICIAN:  Sharlet Salina T. Hoxworth, M.D.          DATE OF BIRTH:  1956/02/13   DATE OF PROCEDURE:  05/07/2002  DATE OF DISCHARGE:                                 OPERATIVE REPORT   PREOPERATIVE DIAGNOSIS:  Left breast mass.   POSTOPERATIVE DIAGNOSIS:  Left breast mass.   PROCEDURE:  Left breast biopsy.   SURGEON:  Lorne Skeens. Hoxworth, M.D.   ANESTHESIA:  Local with IV sedation.   BRIEF HISTORY:  The patient is a 55 year old white female with a personal  history of breast cancer, who recently noted a palpable abnormality in the  lateral aspect of her left breast.  Imaging studies were negative except for  a small cyst, although this is limited due to dense breast tissue.  Exam has  revealed a discrete area of nodularity at the 3 o'clock position in the left  breast 2 cm from the areolar edge.  After discussion of options, including  observation and excisional biopsy, we elected to proceed with excisional  biopsy under local anesthesia with sedation.  The nature of the procedure,  indications, and risks of bleeding and infection, were discussed and  understood.   DESCRIPTION OF PROCEDURE:  The patient was brought into the operating room  and placed in the supine position on the operating table, and IV sedation  was administered.  The left breast was sterilely prepped and draped.  Local  anesthesia was used to infiltrate the periareolar skin and underlying breast  tissue.  A curvilinear incision was made at the areolar border laterally in  the left breast and dissection carried down through the subcutaneous tissue.  A short skin and subcutaneous tissue flap was raised laterally over the area  of the palpable abnormality, which was then completely sharply excised.  Grossly there were several small blue-domed cyst and a lot of fibrotic-  appearing tissue.  Hemostasis was obtained with cautery.  The subcu was  reapproximated with interrupted 4-0 Monocryl and the skin with running  subcuticular 4-0 Monocryl and Steri-Strips.  Sponge, needle, and instrument  counts were correct.  A dry sterile dressing was applied and the patient  taken to recovery in good condition.                                               Lorne Skeens. Hoxworth, M.D.    [name]  D:  05/07/2002  T:  05/07/2002  Job:  [id number]

## 2010-11-13 NOTE — Op Note (Signed)
[name], [name]                         ACCOUNT NO.:  [id number]   MEDICAL RECORD NO.:  [id number]                   PATIENT TYPE:  AMB   LOCATION:  ENDO                                 FACILITY:  [facility]   PHYSICIAN:  Danise Edge, M.D.                DATE OF BIRTH:  05/16/1956   DATE OF PROCEDURE:  08/05/2003  DATE OF DISCHARGE:                                 OPERATIVE REPORT   PROCEDURE:  Colonoscopy with polypectomy.   INDICATIONS FOR PROCEDURE:  The patient is a 55 year old female born on  [birth year].  The patient has been treated for breast cancer.  She  has chronic constipation.  Diagnostic colonoscopy is scheduled.   ENDOSCOPIST:  Danise Edge, M.D.   PREMEDICATION:  Versed 7.5 mg and Demerol 50 mg.   DESCRIPTION OF PROCEDURE:  After obtaining informed consent the patient was  placed in the left lateral decubitus position.  I administered intravenous  Demerol and intravenous Versed to achieve conscious sedation for the  procedure.  The patient's blood pressure, oxygen saturation and cardiac  rhythm were monitored throughout the procedure and documented in the medical  record.   Anal inspection was normal.  Digital rectal examination was normal.  The  Olympus adjustable pediatric colonoscope was introduced into the rectum and  advanced to the cecum. Colonic preparation for the exam today was excellent.   Rectum:  A 2 mm sessile polyp and a 1 mm sessile polyp were removed from the  mid rectum with the electrocautery snare and submitted for pathological  interpretation.   Sigmoid colon and descending colon:  Normal.   Splenic flexure:  Normal.   Transverse colon:  Normal.   Hepatic flexure:  Normal.   Ascending colon:  Normal.   Cecum and ileocecal valve:  Normal.   ASSESSMENT:  Two small polyps were removed from the rectum; otherwise normal  diagnostic proctocolonoscopy to the cecum.   RECOMMENDATIONS:  Repeat colonoscopy in five years if  rectal polyps return  neoplastic.                                               Danise Edge, M.D.    MJ/MEDQ  D:  08/05/2003  T:  08/05/2003  Job:  [id number]   cc:   Ladona Horns. Neijstrom, MD  [address]  [place]  [place] [address]  Fax: [phone]   Georgann Housekeeper, M.D.  [address]  [place]  [place] [address]  Fax: [phone]   Eliberto Ivory. Rosalio Macadamia, M.D.  [address]  [place]  [place] [address]  Fax: [phone]

## 2010-11-13 NOTE — Op Note (Signed)
[place]  Patient:    [name], [name]                        MRN: [id number] Attending:  Verlin Grills, M.D.                           Operative Report  NO DICTATION. DD:  11/16/00 TD:  11/16/00 Job: [id number]

## 2010-11-13 NOTE — Procedures (Signed)
[facility]  Patient:    NATASJA, NIDAY                     MRN: [id number] Proc. Date: 11/16/00 Attending:  Verlin Grills, M.D. CC:         Tyson Dense, M.D.  Ladona Horns. Mariel Sleet, M.D.   Procedure Report  PROCEDURE:  Esophagogastroduodenoscopy.  REFERRING PHYSICIAN:  Tyson Dense, M.D., Ladona Horns. Mariel Sleet, M.D.  INDICATIONS:  Ms. Makiyla Linch. Roseanne Reno is a 55 year old female.  Ms. Roseanne Reno has received therapy for breast cancer in the past.  When she developed nausea and vomiting, she underwent an upper GI x-ray series which was normal except for spasm in the duodenum.  She is referred for diagnostic esophagogastroduodenoscopy.  She is taking Nexium.  ENDOSCOPIST:  Verlin Grills, M.D.  PREMEDICATIONS:  Demerol 50 mg, Versed 10 mg.  ENDOSCOPE:  Olympus gastroscope.  DESCRIPTION OF PROCEDURE:  After obtaining informed consent, the patient was placed in the left lateral decubitus position.  I administered intravenous Demerol and intravenous Versed to achieve conscious sedation for the procedure.  The patients blood pressure, oxygen saturation, and cardiac rhythm were monitored throughout the procedure and documented in the medical record.  The Olympus gastroscope was passed through the posterior hypopharynx into the proximal esophagus without difficulty.  The hypopharynx, larynx and vocal cords appeared normal.  Esophagoscopy:  The proximal, mid and lower segments of the esophagus appeared normal.  Endoscopically there was no evidence for the presence of Barretts esophagus, esophageal mucosal scarring, erosive esophagitis, or esophageal ulcers.  Gastroscopy:  Retroflex view of the gastric, cardia and fundus was normal. The gastric body appeared normal.  There are a few scattered erosions in the gastric antrum.  The pylorus appears normal.  Duodenoscopy:  The duodenal bulb and descending duodenum appeared  completely normal.  ASSESSMENT:  Except for a few scattered erosions in the gastric antrum, Ms. Stewarts esophagogastroduodenoscopy was completely normal.  RECOMMENDATIONS:  Nexium daily for 30 days and then discontinue the Nexium. DD:  11/16/00 TD:  11/16/00 Job: [id number]

## 2010-11-13 NOTE — Op Note (Signed)
NAMECHENEL, WERNLI               ACCOUNT NO.:  [id number]   MEDICAL RECORD NO.:  [id number]          PATIENT TYPE:  OUT   LOCATION:  MDC                          FACILITY:  [facility]   PHYSICIAN:  Genene Churn. Love, M.D.    DATE OF BIRTH:  1956-06-18   DATE OF PROCEDURE:  04/22/2005  DATE OF DISCHARGE:                                 OPERATIVE REPORT   CLINICAL INFORMATION:  This patient is being evaluated for fatigue.   PROCEDURE NOTE:  The patient was prepped and draped in the left lateral  decubitus position using Betadine and 1% Xylocaine.  The L4-L5 interspace  was entered without difficulty.  Opening pressure was 160 mmHg and clear,  colorless CSF was obtained and sent for protein, glucose, cell count, diff,  IGG and oligoclonal IGG in one tube to be held.  The patient tolerated the  procedure well.           ______________________________  Genene Churn. Sandria Manly, M.D.     JML/MEDQ  D:  04/22/2005  T:  04/22/2005  Job:  [id number]

## 2010-11-13 NOTE — Op Note (Signed)
[name], [name]                         ACCOUNT NO.:  [id number]   MEDICAL RECORD NO.:  [id number]                   PATIENT TYPE:  AMB   LOCATION:  SDC                                  FACILITY:  [facility]   PHYSICIAN:  Sherry A. Rosalio Macadamia, M.D.           DATE OF BIRTH:  1956/04/24   DATE OF PROCEDURE:  04/05/2002  DATE OF DISCHARGE:                                 OPERATIVE REPORT   PREOPERATIVE DIAGNOSES:  Desire for sterilization.   POSTOPERATIVE DIAGNOSES:  Desire for sterilization.   PROCEDURE:  Hysteroscopic Essure placement.   SURGEON:  Sherry A. Rosalio Macadamia, M.D.   ANESTHESIA:  MAC.   INDICATIONS:  This is a 55 year old G1, P1-0-0-1 woman who requests  permanent sterilization procedure.  She understands the risks involved as  well as the failure rate and the alternatives to this procedure.  The  patient has had a history of breast cancer having gone through significant  chemotherapy including five years of Tamoxifen therapy.  At this time she is  no longer on Tamoxifen.  The patient has had several D&C/hysteroscopies with  resectoscope for endometrial hyperplasia in the past.   FINDINGS:  Somewhat disordered endometrium, normal cornua, anteflexed uterus  normal size.  No adnexal mass.   PROCEDURE:  The patient was brought into the operating room, given adequate  IV sedation.  She was placed in the dorsal lithotomy position.  Her abdomen  and perineum were washed with Hibiclens.  The patient was draped in a  sterile fashion.  Pelvic examination was performed.  Surgeon's gowns and  gloves were changed.  Speculum was placed within the vagina.  Vagina was  washed with  Hibiclens.  Paracervical block was administered with 1%  Nesacaine.  Anterior lip of the cervix was grasped with a single tooth  tenaculum.  Dilatation was not necessary because of the small size of the  hysteroscope.  The hysteroscope was introduced into the endometrial cavity.  There was some irregular  tissue present but both cornus could be easily  identified.  Therefore, the procedure was attempted.  The right cornua was  identified and using the Essure it was placed in the standard fashion post  placement and removal of the introducing wire there were two to two and a  half coils present.  The left cornua was then identified.  There was some  irregular tissue then present over the cornua and made it a little bit  difficult to see the cornua.  One coil was placed; however, it curled back  on itself.  That one was removed and a second introduced coil was placed  into the cornua without difficulty in a standard fashion once the  introducing wire was removed and the coil was in place.  One coil could be  seen.  Adequate hemostasis was present.  All instruments removed from the  vagina.  The patient was taken out of the dorsal lithotomy  position.  She  was awakened.  She was moved from the operating table to a stretcher in  stable condition.   COMPLICATIONS:  None.    ESTIMATED BLOOD LOSS:  Less than 5 cc.   FLUIDS:  Saline differential was approximately 500 cc, but not measured  exactly.                                               Sherry A. Rosalio Macadamia, M.D.    SAD/MEDQ  D:  04/05/2002  T:  04/05/2002  Job:  [id number]

## 2010-11-18 ENCOUNTER — Emergency Department (HOSPITAL_COMMUNITY)
Admission: EM | Admit: 2010-11-18 | Discharge: 2010-11-18 | Disposition: A | Discharge status: Home / Self Care | Payer: Medicare Other | Attending: Emergency Medicine | Admitting: Emergency Medicine

## 2010-11-18 ENCOUNTER — Emergency Department (HOSPITAL_COMMUNITY): Payer: Medicare Other

## 2010-11-18 DIAGNOSIS — R5381 Other malaise: Secondary | ICD-10-CM | POA: Insufficient documentation

## 2010-11-18 DIAGNOSIS — M25519 Pain in unspecified shoulder: Secondary | ICD-10-CM | POA: Insufficient documentation

## 2010-11-18 DIAGNOSIS — E039 Hypothyroidism, unspecified: Secondary | ICD-10-CM | POA: Insufficient documentation

## 2010-11-18 DIAGNOSIS — F411 Generalized anxiety disorder: Secondary | ICD-10-CM | POA: Insufficient documentation

## 2010-11-18 DIAGNOSIS — Z853 Personal history of malignant neoplasm of breast: Secondary | ICD-10-CM | POA: Insufficient documentation

## 2010-11-18 DIAGNOSIS — R110 Nausea: Secondary | ICD-10-CM | POA: Insufficient documentation

## 2010-11-18 DIAGNOSIS — R5383 Other fatigue: Secondary | ICD-10-CM | POA: Insufficient documentation

## 2010-11-18 LAB — POCT CARDIAC MARKERS
CKMB, poc: <1 ng/mL — ABNORMAL LOW (ref 1.0–8.0)
Myoglobin, poc: 66 ng/mL (ref 12–200)
Troponin i, poc: <0.05 ng/mL (ref 0.00–0.09)

## 2010-11-18 LAB — URINALYSIS, ROUTINE W REFLEX MICROSCOPIC
Bilirubin Urine: NEGATIVE
Glucose, UA: NEGATIVE mg/dL
Hgb urine dipstick: NEGATIVE
Ketones, ur: NEGATIVE mg/dL
Nitrite: NEGATIVE
Protein, ur: NEGATIVE mg/dL
Specific Gravity, Urine: 1.007 (ref 1.005–1.030)
Urobilinogen, UA: 0.2 mg/dL (ref 0.0–1.0)
pH: 6.5 (ref 5.0–8.0)

## 2010-11-18 LAB — T4, FREE: Free T4: 1.44 ng/dL (ref 0.80–1.80)

## 2010-11-18 LAB — COMPREHENSIVE METABOLIC PANEL
ALT: 14 U/L (ref 0–35)
AST: 24 U/L (ref 0–37)
Albumin: 3.9 g/dL (ref 3.5–5.2)
Alkaline Phosphatase: 109 U/L (ref 39–117)
BUN: 19 mg/dL (ref 6–23)
CO2: 25 mEq/L (ref 19–32)
Calcium: 8.7 mg/dL (ref 8.4–10.5)
Chloride: 102 mEq/L (ref 96–112)
Creatinine, Ser: 0.89 mg/dL (ref 0.4–1.2)
GFR calc Af Amer: >60 mL/min (ref >60)
GFR calc non Af Amer: >60 mL/min (ref >60)
Glucose, Bld: 90 mg/dL (ref 70–99)
Potassium: 3.8 mEq/L (ref 3.5–5.1)
Sodium: 137 mEq/L (ref 135–145)
Total Bilirubin: 0.5 mg/dL (ref 0.3–1.2)
Total Protein: 6.5 g/dL (ref 6.0–8.3)

## 2010-11-18 LAB — DIFFERENTIAL
Basophils Absolute: 0 10*3/uL (ref 0.0–0.1)
Basophils Relative: 1 % (ref 0–1)
Eosinophils Absolute: 0.1 10*3/uL (ref 0.0–0.7)
Eosinophils Relative: 1 % (ref 0–5)
Lymphocytes Relative: 31 % (ref 12–46)
Lymphs Abs: 2.3 10*3/uL (ref 0.7–4.0)
Monocytes Absolute: 0.6 10*3/uL (ref 0.1–1.0)
Monocytes Relative: 7 % (ref 3–12)
Neutro Abs: 4.5 10*3/uL (ref 1.7–7.7)
Neutrophils Relative %: 60 % (ref 43–77)

## 2010-11-18 LAB — CBC
HCT: 41.3 % (ref 36.0–46.0)
Hemoglobin: 14 g/dL (ref 12.0–15.0)
MCH: 31.6 pg (ref 26.0–34.0)
MCHC: 33.9 g/dL (ref 30.0–36.0)
MCV: 93.2 fL (ref 78.0–100.0)
Platelets: 199 10*3/uL (ref 150–400)
RBC: 4.43 MIL/uL (ref 3.87–5.11)
RDW: 12.3 % (ref 11.5–15.5)
WBC: 7.4 10*3/uL (ref 4.0–10.5)

## 2010-11-18 LAB — TSH: TSH: 0.895 u[IU]/mL (ref 0.350–4.500)

## 2010-11-18 LAB — D-DIMER, QUANTITATIVE: D-Dimer, Quant: 0.29 ug/mL-FEU (ref 0.00–0.48)

## 2010-11-19 ENCOUNTER — Encounter: Payer: Self-pay | Admitting: Emergency Medicine

## 2010-11-30 ENCOUNTER — Encounter: Payer: Self-pay | Admitting: Cardiology

## 2010-11-30 ENCOUNTER — Other Ambulatory Visit: Payer: Self-pay | Admitting: *Deleted

## 2010-11-30 ENCOUNTER — Ambulatory Visit (INDEPENDENT_AMBULATORY_CARE_PROVIDER_SITE_OTHER): Payer: Medicare Other | Admitting: Cardiology

## 2010-11-30 DIAGNOSIS — R079 Chest pain, unspecified: Secondary | ICD-10-CM

## 2010-11-30 DIAGNOSIS — R5381 Other malaise: Secondary | ICD-10-CM

## 2010-11-30 DIAGNOSIS — R0602 Shortness of breath: Secondary | ICD-10-CM

## 2010-11-30 DIAGNOSIS — R42 Dizziness and giddiness: Secondary | ICD-10-CM

## 2010-11-30 DIAGNOSIS — R531 Weakness: Secondary | ICD-10-CM

## 2010-12-01 ENCOUNTER — Encounter: Payer: Self-pay | Admitting: Emergency Medicine

## 2010-12-01 ENCOUNTER — Ambulatory Visit (INDEPENDENT_AMBULATORY_CARE_PROVIDER_SITE_OTHER): Payer: Medicare Other | Admitting: Emergency Medicine

## 2010-12-01 VITALS — BP 110/64 | HR 82 | Temp 97.9°F | Ht 65.5 in | Wt 153.2 lb

## 2010-12-01 DIAGNOSIS — R0602 Shortness of breath: Secondary | ICD-10-CM

## 2010-12-01 LAB — BRAIN NATRIURETIC PEPTIDE: Pro B Natriuretic peptide (BNP): 41 pg/mL (ref 0.0–100.0)

## 2010-12-01 NOTE — Progress Notes (Signed)
  Subjective:    Patient ID: [name], female    DOB: 1956/05/08, 55 y.o.   MRN: [id number]  HPI 55 yo woman, hx breast CA s/p auto-stem cell tx '95 without recurrance [facility]). Course was c/b pulmonary toxicity - abnormal  CT's and PFT's. Has had some dyspnea for the last 15 yrs since the transplant. Began to see Dr Deborah Chalk about 5 yrs ago for tachycardia, referred back to him for CP that could happen at random. Underwent cardiac stress test that was reassuring.   Has been dealing with some exertional dyspnea 12/09. Over the last 6 months has noticed episodes of inability to do housework, difficulty with speaking. She does note that her exercise routine had been interrupted, now back to the gym. No cough, no wheeze. PFT's with mild restriction, DLCO that corrects to normal for [facility].   10/07/08 - went to see Dr Jarold Motto for allergies, yellow nasal discharge. Dry cough.Treated with azithro with improvement, no purulent sputum. CXR done 10/04/08 as below. Has also had more longstanding exertional SOB, also c/o muscle and joint aches - shoulders and R arm, not always with exertion.   ROV 12/01/10 -- 55 yo woman, hx restriction felt due to chemo, pulm toxicity. Over the last several weeks progressive weakness and SOB, some pain between shoulder blades over the last 3 months. Any exertion is taxing her. Her meds have been adjusted over the time period - added zoloft, stopped due to serotonin syndrome; started and stopped lamictal due to rash; wellbutrin started; TSH OK on synthroid. She has xanax and hydrocodone available but rarely takes. She is on Trazadone. Dr Deborah Chalk saw her and is concerned that this is related to CM from her chemo.    Review of Systems As per HPI    Objective:   Physical Exam Gen: Pleasant, well-nourished, in no distress,  normal affect  ENT: No lesions,  mouth clear,  oropharynx clear, no postnasal drip  Neck: No JVD, no TMG, no carotid bruits  Lungs: No use of accessory  muscles, no dullness to percussion, clear without rales or rhonchi  Cardiovascular: RRR, heart sounds normal, no murmur or gallops, no peripheral edema  Abdomen: soft and NT, no HSM,  BS normal  Musculoskeletal: No deformities, no cyanosis or clubbing  Neuro: alert, non focal  Skin: Warm, no lesions or rashes        Assessment & Plan:

## 2010-12-01 NOTE — Assessment & Plan Note (Signed)
Unexplained at this time. No evidence for new lung process at this time - CXR ok, lung exam ok.  - will consider repeat PFT, but I would prefer to wait until TTE results are back

## 2010-12-01 NOTE — Patient Instructions (Signed)
Please get your echocardiogram as planned by Dr Deborah Chalk We will hold off on PFT's for now, but may consider in the future if your other testing is unrevealing. Follow with Drs Jarold Motto and [facility] as planned Follow with Dr Delton Coombes next available after your echocardiogram.

## 2010-12-02 ENCOUNTER — Other Ambulatory Visit: Payer: Self-pay | Admitting: Neurology

## 2010-12-02 ENCOUNTER — Telehealth: Payer: Self-pay | Admitting: *Deleted

## 2010-12-02 ENCOUNTER — Telehealth: Payer: Self-pay | Admitting: Emergency Medicine

## 2010-12-02 ENCOUNTER — Encounter: Payer: Self-pay | Admitting: Cardiology

## 2010-12-02 DIAGNOSIS — R531 Weakness: Secondary | ICD-10-CM | POA: Insufficient documentation

## 2010-12-02 DIAGNOSIS — Z9481 Bone marrow transplant status: Secondary | ICD-10-CM

## 2010-12-02 DIAGNOSIS — G629 Polyneuropathy, unspecified: Secondary | ICD-10-CM

## 2010-12-02 DIAGNOSIS — R519 Headache, unspecified: Secondary | ICD-10-CM

## 2010-12-02 DIAGNOSIS — R5383 Other fatigue: Secondary | ICD-10-CM | POA: Insufficient documentation

## 2010-12-02 DIAGNOSIS — Z0000 Encounter for general adult medical examination without abnormal findings: Secondary | ICD-10-CM

## 2010-12-02 NOTE — Telephone Encounter (Signed)
Called, spoke with pt.  States when she was seen yesterday RB told her to call to schedule f/u with Dr. Jerelyn Scott asap and this could not wait until August.  Pt states she called and was told his first available is not until July 18.  However, the PA can see her this Friday.  Pt states if there is a chance she has a reaccurance of cancer she would rather see the actual Dr.  However, she would like to do what RB thinks would be best for her.  Pt also would like RB to refer her to a PCP who he feels would be good for her within the [facility].  Will forward message to RB -- pls address this today as pt would like a response today.  Thanks!  Note:  Pt would like a call back today.

## 2010-12-02 NOTE — Telephone Encounter (Signed)
It would probably be reasonable for her to be tested for mononucleosis if she was exposed. She is being referred to Dr Felicity Coyer for general health maintenance, hx bone marrow Tx, fatigue, peripheral neuropathy.

## 2010-12-02 NOTE — Telephone Encounter (Signed)
Called spoke with patient, advised her of RB's recs about being tested for mono.  Pt will request this be done when she sees the PA at her oncology appt on Friday.    Referral placed in epic for PCP.

## 2010-12-02 NOTE — Telephone Encounter (Signed)
Called spoke with patient, advised of RB's recs.  Pt verbalized her understanding and will keep the 6.8.12 appt with the PA w/ [facility].  Pt also prefers to be referred to Dr Felicity Coyer for convenience as PCP referral.  RB, what diagnosis would you like associated with this referral?  Dr Delton Coombes, pt did request that you be questioned about this: last month she spent a lot of time in [place] helping her sister and recently found out that her niece has "the contagious mono."  Pt states with her "myriad symptoms" should she be tested for mono?  Please advise, thanks.

## 2010-12-02 NOTE — Telephone Encounter (Signed)
Notified of lab results; to have Echo on 6/14; will call her results.
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
## 2010-12-02 NOTE — Assessment & Plan Note (Signed)
Her weakness is almost certainly multifactorial. We will repeatr 2-D echocardiogram to make sure we don't have any changes from a year and a half ago. We'll check a BNP to make sure we don't have any underlying congestive heart failure.Dr. Sandria Manly will see her for followup of her MRI of the head. Currently, I don't see any underlying cardiac issue at this time.

## 2010-12-02 NOTE — Telephone Encounter (Signed)
I think she should go ahead and see the NP so that they are aware of her symptoms and her case can be discussed with Dr Jerelyn Scott if necessary.   I would be happy to refer her to one of our PCP's - I would recommend either Dr Artist Pais in [facility] or Dr Felicity Coyer in [place].

## 2010-12-02 NOTE — Telephone Encounter (Signed)
Message copied by Lorayne Bender on Wed Dec 02, 2010 11:02 AM ------      Message from: Roger Shelter      Created: Wed Dec 02, 2010  8:36 AM       Normal result.

## 2010-12-03 ENCOUNTER — Ambulatory Visit
Admission: RE | Admit: 2010-12-03 | Discharge: 2010-12-03 | Disposition: A | Discharge status: Home / Self Care | Payer: Medicare Other | Source: Ambulatory Visit | Attending: Neurology | Admitting: Neurology

## 2010-12-03 DIAGNOSIS — R519 Headache, unspecified: Secondary | ICD-10-CM

## 2010-12-03 MED ORDER — GADOBENATE DIMEGLUMINE 529 MG/ML IV SOLN
15.0000 mL | Freq: Once | INTRAVENOUS | Status: AC | PRN
  Administered 2010-12-03: 15 mL via INTRAVENOUS

## 2010-12-04 ENCOUNTER — Other Ambulatory Visit (HOSPITAL_COMMUNITY): Payer: Self-pay | Admitting: Oncology

## 2010-12-04 ENCOUNTER — Encounter (HOSPITAL_COMMUNITY): Payer: Medicare Other | Attending: Oncology | Admitting: Oncology

## 2010-12-04 DIAGNOSIS — F329 Major depressive disorder, single episode, unspecified: Secondary | ICD-10-CM | POA: Insufficient documentation

## 2010-12-04 DIAGNOSIS — R6889 Other general symptoms and signs: Secondary | ICD-10-CM | POA: Insufficient documentation

## 2010-12-04 DIAGNOSIS — F3289 Other specified depressive episodes: Secondary | ICD-10-CM | POA: Insufficient documentation

## 2010-12-04 DIAGNOSIS — Z9889 Other specified postprocedural states: Secondary | ICD-10-CM | POA: Insufficient documentation

## 2010-12-04 DIAGNOSIS — E039 Hypothyroidism, unspecified: Secondary | ICD-10-CM | POA: Insufficient documentation

## 2010-12-04 DIAGNOSIS — R5381 Other malaise: Secondary | ICD-10-CM | POA: Insufficient documentation

## 2010-12-04 DIAGNOSIS — G609 Hereditary and idiopathic neuropathy, unspecified: Secondary | ICD-10-CM | POA: Insufficient documentation

## 2010-12-04 DIAGNOSIS — Z79899 Other long term (current) drug therapy: Secondary | ICD-10-CM | POA: Insufficient documentation

## 2010-12-04 DIAGNOSIS — Z853 Personal history of malignant neoplasm of breast: Secondary | ICD-10-CM | POA: Insufficient documentation

## 2010-12-04 DIAGNOSIS — R5383 Other fatigue: Secondary | ICD-10-CM | POA: Insufficient documentation

## 2010-12-04 DIAGNOSIS — R42 Dizziness and giddiness: Secondary | ICD-10-CM | POA: Insufficient documentation

## 2010-12-04 DIAGNOSIS — R51 Headache: Secondary | ICD-10-CM | POA: Insufficient documentation

## 2010-12-04 DIAGNOSIS — C50919 Malignant neoplasm of unspecified site of unspecified female breast: Secondary | ICD-10-CM

## 2010-12-04 LAB — COMPREHENSIVE METABOLIC PANEL
ALT: 11 U/L (ref 0–35)
AST: 21 U/L (ref 0–37)
Albumin: 4 g/dL (ref 3.5–5.2)
Alkaline Phosphatase: 113 U/L (ref 39–117)
BUN: 18 mg/dL (ref 6–23)
CO2: 29 mEq/L (ref 19–32)
Calcium: 9.7 mg/dL (ref 8.4–10.5)
Chloride: 100 mEq/L (ref 96–112)
Creatinine, Ser: 0.89 mg/dL (ref 0.4–1.2)
GFR calc Af Amer: >60 mL/min (ref >60)
GFR calc non Af Amer: >60 mL/min (ref >60)
Glucose, Bld: 86 mg/dL (ref 70–99)
Potassium: 3.7 mEq/L (ref 3.5–5.1)
Sodium: 137 mEq/L (ref 135–145)
Total Bilirubin: 0.5 mg/dL (ref 0.3–1.2)
Total Protein: 6.5 g/dL (ref 6.0–8.3)

## 2010-12-04 LAB — DIFFERENTIAL
Basophils Absolute: 0 10*3/uL (ref 0.0–0.1)
Basophils Relative: 1 % (ref 0–1)
Eosinophils Absolute: 0.1 10*3/uL (ref 0.0–0.7)
Eosinophils Relative: 2 % (ref 0–5)
Lymphocytes Relative: 39 % (ref 12–46)
Lymphs Abs: 2.4 10*3/uL (ref 0.7–4.0)
Monocytes Absolute: 0.4 10*3/uL (ref 0.1–1.0)
Monocytes Relative: 7 % (ref 3–12)
Neutro Abs: 3.2 10*3/uL (ref 1.7–7.7)
Neutrophils Relative %: 52 % (ref 43–77)

## 2010-12-04 LAB — VITAMIN B12: Vitamin B-12: 380 pg/mL (ref 211–911)

## 2010-12-04 LAB — CBC
HCT: 40.4 % (ref 36.0–46.0)
Hemoglobin: 13.7 g/dL (ref 12.0–15.0)
MCH: 32.2 pg (ref 26.0–34.0)
MCHC: 33.9 g/dL (ref 30.0–36.0)
MCV: 94.8 fL (ref 78.0–100.0)
Platelets: 188 10*3/uL (ref 150–400)
RBC: 4.26 MIL/uL (ref 3.87–5.11)
RDW: 12.4 % (ref 11.5–15.5)
WBC: 6.1 10*3/uL (ref 4.0–10.5)

## 2010-12-04 LAB — FERRITIN: Ferritin: 193 ng/mL (ref 10–291)

## 2010-12-04 LAB — MONONUCLEOSIS SCREEN: Mono Screen: NEGATIVE

## 2010-12-04 LAB — IRON AND TIBC
Iron: 99 ug/dL (ref 42–135)
Saturation Ratios: 35 % (ref 20–55)
TIBC: 284 ug/dL (ref 250–470)
UIBC: 185 ug/dL

## 2010-12-04 LAB — TSH: TSH: 1.108 u[IU]/mL (ref 0.350–4.500)

## 2010-12-04 LAB — FOLATE: Folate: 12.2 ng/mL

## 2010-12-04 LAB — CANCER ANTIGEN 27.29: CA 27.29: 21 U/mL (ref 0–39)

## 2010-12-04 LAB — RPR: RPR Ser Ql: NONREACTIVE

## 2010-12-10 ENCOUNTER — Ambulatory Visit (HOSPITAL_COMMUNITY): Payer: Medicare Other | Attending: Cardiology | Admitting: Radiology

## 2010-12-10 DIAGNOSIS — R5381 Other malaise: Secondary | ICD-10-CM

## 2010-12-10 DIAGNOSIS — I379 Nonrheumatic pulmonary valve disorder, unspecified: Secondary | ICD-10-CM | POA: Insufficient documentation

## 2010-12-10 DIAGNOSIS — R42 Dizziness and giddiness: Secondary | ICD-10-CM | POA: Insufficient documentation

## 2010-12-10 DIAGNOSIS — R0609 Other forms of dyspnea: Secondary | ICD-10-CM | POA: Insufficient documentation

## 2010-12-10 DIAGNOSIS — R0989 Other specified symptoms and signs involving the circulatory and respiratory systems: Secondary | ICD-10-CM

## 2010-12-10 DIAGNOSIS — R531 Weakness: Secondary | ICD-10-CM

## 2010-12-10 DIAGNOSIS — R5383 Other fatigue: Secondary | ICD-10-CM | POA: Insufficient documentation

## 2010-12-10 DIAGNOSIS — C50919 Malignant neoplasm of unspecified site of unspecified female breast: Secondary | ICD-10-CM | POA: Insufficient documentation

## 2010-12-10 DIAGNOSIS — I079 Rheumatic tricuspid valve disease, unspecified: Secondary | ICD-10-CM | POA: Insufficient documentation

## 2010-12-10 DIAGNOSIS — Z09 Encounter for follow-up examination after completed treatment for conditions other than malignant neoplasm: Secondary | ICD-10-CM

## 2010-12-11 ENCOUNTER — Telehealth: Payer: Self-pay | Admitting: *Deleted

## 2010-12-11 ENCOUNTER — Ambulatory Visit
Admission: RE | Admit: 2010-12-11 | Discharge: 2010-12-11 | Disposition: A | Discharge status: Home / Self Care | Payer: Medicare Other | Source: Ambulatory Visit | Attending: Obstetrics & Gynecology | Admitting: Obstetrics & Gynecology

## 2010-12-11 ENCOUNTER — Other Ambulatory Visit: Payer: Self-pay | Admitting: Obstetrics & Gynecology

## 2010-12-11 DIAGNOSIS — Z9889 Other specified postprocedural states: Secondary | ICD-10-CM

## 2010-12-11 NOTE — Telephone Encounter (Signed)
Message copied by Gwynneth Albright on Fri Dec 11, 2010  3:48 PM ------      Message from: Alfonso Ramus J      Created: Wed Dec 09, 2010  2:16 PM       Order placed for referral to Dr. Felicity Coyer. Dr Felicity Coyer is not accepting any new patients at this time. Dr. Debby Bud and Dr. Jonny Ruiz are the only two who are.      Please advise.       Thanks,      Bjorn Loser

## 2010-12-14 NOTE — Telephone Encounter (Signed)
Per Dr. Delton Coombes, okay to schedule with whomever pt prefers. Rhonda aware of this.

## 2010-12-15 ENCOUNTER — Telehealth: Payer: Self-pay | Admitting: *Deleted

## 2010-12-15 NOTE — Telephone Encounter (Signed)
Pt notified of echo results

## 2010-12-15 NOTE — Telephone Encounter (Signed)
Message copied by Adolphus Birchwood on Tue Dec 15, 2010 10:16 AM ------      Message from: Roger Shelter      Created: Fri Dec 11, 2010  1:00 PM       Mildly reduced EF.

## 2010-12-17 ENCOUNTER — Encounter: Payer: Self-pay | Admitting: Emergency Medicine

## 2010-12-17 ENCOUNTER — Ambulatory Visit (INDEPENDENT_AMBULATORY_CARE_PROVIDER_SITE_OTHER): Payer: Medicare Other | Admitting: Emergency Medicine

## 2010-12-17 VITALS — BP 90/58 | HR 96 | Temp 97.7°F | Ht 65.0 in | Wt 151.8 lb

## 2010-12-17 DIAGNOSIS — R0602 Shortness of breath: Secondary | ICD-10-CM

## 2010-12-17 NOTE — Progress Notes (Signed)
  Subjective:    Patient ID: [name], female    DOB: 03-07-1956, 55 y.o.   MRN: [id number]  HPI 55 yo woman, hx breast CA s/p auto-stem cell tx '95 without recurrance [facility]). Course was c/b pulmonary toxicity - abnormal  CT's and PFT's. Has had some dyspnea for the last 15 yrs since the transplant. Began to see Dr Deborah Chalk about 5 yrs ago for tachycardia, referred back to him for CP that could happen at random. Underwent cardiac stress test that was reassuring.   Has been dealing with some exertional dyspnea 12/09. Over the last 6 months has noticed episodes of inability to do housework, difficulty with speaking. She does note that her exercise routine had been interrupted, now back to the gym. No cough, no wheeze. PFT's with mild restriction, DLCO that corrects to normal for [facility].   10/07/08 - went to see Dr Jarold Motto for allergies, yellow nasal discharge. Dry cough.Treated with azithro with improvement, no purulent sputum. CXR done 10/04/08 as below. Has also had more longstanding exertional SOB, also c/o muscle and joint aches - shoulders and R arm, not always with exertion.   ROV 12/01/10 -- 55 yo woman, hx restriction felt due to chemo, pulm toxicity. Over the last several weeks progressive weakness and SOB, some pain between shoulder blades over the last 3 months. Any exertion is taxing her. Her meds have been adjusted over the time period - added zoloft, stopped due to serotonin syndrome; started and stopped lamictal due to rash; wellbutrin started; TSH OK on synthroid. She has xanax and hydrocodone available but rarely takes. She is on Trazadone. Dr Deborah Chalk saw her and is concerned that this is related to CM from her chemo.   ROV 12/17/10 -- returns for eval progressive dyspnea. TTE performed since last visit: LV fxn 45-50% predicted, diastolic dysfxn, down from her prior TTE, thought to be due to her prior chemotherapy. Remains SOB with exertion, having shoulder, back and neck pain. C/o HA and  blurred vision today as well.   Review of Systems As per HPI    Objective:   Physical Exam Gen: Pleasant, well-nourished, in no distress,  normal affect  ENT: No lesions,  mouth clear,  oropharynx clear, no postnasal drip  Neck: No JVD, no TMG, no carotid bruits  Lungs: No use of accessory muscles, no dullness to percussion, clear without rales or rhonchi  Cardiovascular: RRR, heart sounds normal, no murmur or gallops, no peripheral edema  Musculoskeletal: No deformities, no cyanosis or clubbing  Neuro: alert, non focal  Skin: Warm, no lesions or rashes   TTE: 12/10/10  Left ventricle: The cavity size was normal. Wall thickness was normal. Systolic function was mildly reduced. The estimated ejection fraction was in the range of 45% to 50%. Wall motion was normal; there were no regional wall motion abnormalities. Doppler parameters are consistent with abnormal left ventricular relaxation (grade 1 diastolic dysfunction).    Assessment & Plan:

## 2010-12-17 NOTE — Patient Instructions (Signed)
We will perform full PFT on the date of your next appointment We will confirm an initial visit with Dr Felicity Coyer Follow up with Dr Delton Coombes next available with full PFT.

## 2010-12-17 NOTE — Assessment & Plan Note (Addendum)
We will repeat PFT to compared with priors. No abnormal lung sounds or imaging. TTE shows LV fxn is a bit decreased compared w prior.

## 2010-12-28 ENCOUNTER — Encounter: Payer: Self-pay | Admitting: Internal Medicine

## 2010-12-28 ENCOUNTER — Ambulatory Visit (INDEPENDENT_AMBULATORY_CARE_PROVIDER_SITE_OTHER): Payer: Medicare Other | Admitting: Internal Medicine

## 2010-12-28 DIAGNOSIS — M6281 Muscle weakness (generalized): Secondary | ICD-10-CM

## 2010-12-28 DIAGNOSIS — F32A Depression, unspecified: Secondary | ICD-10-CM

## 2010-12-28 DIAGNOSIS — F329 Major depressive disorder, single episode, unspecified: Secondary | ICD-10-CM

## 2010-12-28 DIAGNOSIS — R29898 Other symptoms and signs involving the musculoskeletal system: Secondary | ICD-10-CM

## 2010-12-28 DIAGNOSIS — E039 Hypothyroidism, unspecified: Secondary | ICD-10-CM

## 2010-12-28 DIAGNOSIS — F418 Other specified anxiety disorders: Secondary | ICD-10-CM

## 2010-12-28 DIAGNOSIS — K5900 Constipation, unspecified: Secondary | ICD-10-CM

## 2010-12-28 DIAGNOSIS — R0789 Other chest pain: Secondary | ICD-10-CM | POA: Insufficient documentation

## 2010-12-28 HISTORY — DX: Other specified anxiety disorders: F41.8

## 2010-12-28 NOTE — Assessment & Plan Note (Signed)
Continue Colace. Attempt MiraLax.Followup if no improvement or worsening.

## 2010-12-28 NOTE — Assessment & Plan Note (Signed)
Proceed with cardiology evaluation currently scheduled in the very near future. Reviewed mildly depressed ejection fraction. No current clinical evidence of volume overload. Recent BNP 41.

## 2010-12-28 NOTE — Assessment & Plan Note (Signed)
Stable. TSH up-to-date June 2012

## 2010-12-28 NOTE — Assessment & Plan Note (Signed)
Stable. Followed by psychiatry and counselor

## 2010-12-28 NOTE — Progress Notes (Signed)
Subjective:    Patient ID: [name], female    DOB: 08-08-1955, 55 y.o.   MRN: [id number]  HPI and the patient presents to clinic to establish primary care and for followup of multiple medical problems. Chart use including specialist notes of pulmonary. Has no history of breast cancer status post bone marrow transplant previously at [facility] Currently followed by local oncology and irregular basis with close surveillance. Recently evaluated by pulmonary because of chronic shortness of breath with some thought that this may be attributed to previous chemotherapy. Scheduled for pulmonary function tests. Complains of shortness of breath with mild exertion. Also notes intermittent chest pain with exertion which is predominantly left-sided and may radiate slightly up the left side of the neck. Describes it as pressure with an overall duration less than one minute followed by spontaneous resolution. Is not positional or reproducible. He is scheduled to see cardiology in the very near future for evaluation. Recent echocardiogram demonstrated mild reduction of ejection fraction 45-50%. Does have history of depression and anxiety currently followed by psychiatry and counseling. Takes Xanax essentially each bedtime and takes low-dose Wellbutrin. Previously had difficulty with SSRIs including? Serotonin syndrome. Presented to the emergency department May 2012 with diffuse weakness scapular pain blurry vision dizziness as well right hand weakness. States her right hand weakness has been progressive over several months. Has not resolved. No exacerbating or alleviating factors. Did see her neurologist after the ED visit and underwent reportedly MRI and MRA of the head. Records not available however appears she was told there were no acute findings. Notes constipation over the past several days unresponsive to stool softener. Takes hydrocodone on a rare when necessary basis as well as Lasix when necessary and  calcium daily. Has mild nausea and decreased appetite but no emesis. Reviewed recent laboratory evaluation December 04, 2003 including CBC, B12, BNP, d-dimer, mononucleosis screen, RPR, iron, TSH, folate, Chem-12. No other complaints.  Review of past history, past surgical history, medications, allergies, social history and family history.     Review of Systems  Constitutional: Positive for appetite change and fatigue. Negative for fever and chills.  Eyes: Positive for visual disturbance.  Respiratory: Positive for chest tightness and shortness of breath. Negative for cough and wheezing.   Cardiovascular: Positive for chest pain. Negative for palpitations.  Gastrointestinal: Positive for nausea and constipation. Negative for vomiting and blood in stool.  Neurological: Positive for tremors, weakness, light-headedness and headaches. Negative for seizures and syncope.  Psychiatric/Behavioral: Positive for sleep disturbance and dysphoric mood. The patient is nervous/anxious.   All other systems reviewed and are negative.       Objective:   Physical Exam  Nursing note and vitals reviewed. Constitutional: She appears well-developed and well-nourished. No distress.  HENT:  Head: Normocephalic and atraumatic.  Right Ear: Tympanic membrane, external ear and ear canal normal.  Left Ear: Tympanic membrane, external ear and ear canal normal.  Nose: Nose normal.  Mouth/Throat: Oropharynx is clear and moist. No oropharyngeal exudate.  Eyes: Conjunctivae and EOM are normal. Pupils are equal, round, and reactive to light. No scleral icterus.  Neck: Neck supple. No JVD present.       No carotid bruits  Cardiovascular: Normal rate, regular rhythm and normal heart sounds.  Exam reveals no gallop and no friction rub.   No murmur heard. Pulmonary/Chest: Effort normal and breath sounds normal. No respiratory distress. She has no wheezes. She has no rales.  Lymphadenopathy:    She has no cervical  adenopathy.   Neurological: She is alert.       Upper extremity strength 5/5. Distal MCP and intertriginous muscle strength five -/ five right hand (nondominant). Left hand 5/5  Skin: Skin is warm and dry. She is not diaphoretic.  Psychiatric: She has a normal mood and affect.          Assessment & Plan:

## 2010-12-28 NOTE — Assessment & Plan Note (Signed)
Reportedly no acute finding cranial MRI and MRA recently. Recommend followup with neurology for further evaluation

## 2011-01-05 ENCOUNTER — Other Ambulatory Visit (HOSPITAL_COMMUNITY): Payer: Self-pay | Admitting: Oncology

## 2011-01-05 DIAGNOSIS — C50919 Malignant neoplasm of unspecified site of unspecified female breast: Secondary | ICD-10-CM

## 2011-01-08 ENCOUNTER — Ambulatory Visit (INDEPENDENT_AMBULATORY_CARE_PROVIDER_SITE_OTHER): Payer: Medicare Other | Admitting: Emergency Medicine

## 2011-01-08 ENCOUNTER — Encounter: Payer: Self-pay | Admitting: Cardiology

## 2011-01-08 ENCOUNTER — Telehealth (HOSPITAL_COMMUNITY): Payer: Self-pay | Admitting: Oncology

## 2011-01-08 ENCOUNTER — Ambulatory Visit (INDEPENDENT_AMBULATORY_CARE_PROVIDER_SITE_OTHER): Payer: Medicare Other | Admitting: Internal Medicine

## 2011-01-08 ENCOUNTER — Encounter: Payer: Self-pay | Admitting: Internal Medicine

## 2011-01-08 ENCOUNTER — Encounter: Payer: Self-pay | Admitting: Emergency Medicine

## 2011-01-08 VITALS — BP 119/71 | HR 101 | Ht 65.5 in | Wt 150.0 lb

## 2011-01-08 DIAGNOSIS — R0602 Shortness of breath: Secondary | ICD-10-CM

## 2011-01-08 LAB — PULMONARY FUNCTION TEST

## 2011-01-08 NOTE — Patient Instructions (Addendum)
Your pulmonary function testing is normal, stable compared with prior testing.  Follow with Dr Tenny Craw as planned  We could consider cardiopulmonary exercise testing in the future. I would defer at this time, pending Dr Charlott Rakes evaluation.

## 2011-01-08 NOTE — Assessment & Plan Note (Addendum)
PFT normal - no significant change based on predicted values. Only revealing data so far has been decrease in LV fxn - plan f/u w DR Tenny Craw after TTE interpretation, possible cardiac stress test. If cardiac testing not indicated, could perform cardiopulmopnary exercise testing - I would defer this fo rnow

## 2011-01-08 NOTE — Progress Notes (Signed)
Subjective:    Patient ID: [name], female    DOB: 03/30/1956, 55 y.o.   MRN: [id number]  HPI 55 yo woman, hx breast CA s/p auto-stem cell tx '95 without recurrance [facility]). Course was c/b pulmonary toxicity - abnormal  CT's and PFT's. Has had some dyspnea for the last 15 yrs since the transplant. Began to see Dr Deborah Chalk about 5 yrs ago for tachycardia, referred back to him for CP that could happen at random. Underwent cardiac stress test that was reassuring.   Has been dealing with some exertional dyspnea 12/09. Over the last 6 months has noticed episodes of inability to do housework, difficulty with speaking. She does note that her exercise routine had been interrupted, now back to the gym. No cough, no wheeze. PFT's with mild restriction, DLCO that corrects to normal for [facility].   10/07/08 - went to see Dr Jarold Motto for allergies, yellow nasal discharge. Dry cough.Treated with azithro with improvement, no purulent sputum. CXR done 10/04/08 as below. Has also had more longstanding exertional SOB, also c/o muscle and joint aches - shoulders and R arm, not always with exertion.   ROV 12/01/10 -- 55 yo woman, hx restriction felt due to chemo, pulm toxicity. Over the last several weeks progressive weakness and SOB, some pain between shoulder blades over the last 3 months. Any exertion is taxing her. Her meds have been adjusted over the time period - added zoloft, stopped due to serotonin syndrome; started and stopped lamictal due to rash; wellbutrin started; TSH OK on synthroid. She has xanax and hydrocodone available but rarely takes. She is on Trazadone. Dr Deborah Chalk saw her and is concerned that this is related to CM from her chemo.   ROV 12/17/10 -- returns for eval progressive dyspnea. TTE performed since last visit: LV fxn 45-50% predicted, diastolic dysfxn, down from her prior TTE, thought to be due to her prior chemotherapy. Remains SOB with exertion, having shoulder, back and neck pain. C/o HA and  blurred vision today as well.   ROV 01/08/11 -- hx chemo for breast CA, impaired LV fxn presumed due to that treatment. Here after PFT's to review. Feeling about the same. Planning to review TTE w Dr Tenny Craw and then check into possible cardiac stress test.    PULMONARY FUNCTON TEST 05/31/2008 06/24/2008 01/08/2011  FVC 3.12 3.27 2.93  FEV1 2.46 2.54 2.22  FEV1/FVC 78.8 77.7 75.8  FVC  % Predicted 91 93 84  FEV % Predicted 95 95 85  FeF 25-75 2.27 2.29 1.86  FeF 25-75 % Predicted 2.94 2.98 2.92    Review of Systems As per HPI    Objective:   Physical Exam Gen: Pleasant, well-nourished, in no distress,  normal affect  ENT: No lesions,  mouth clear,  oropharynx clear, no postnasal drip  Neck: No JVD, no TMG, no carotid bruits  Lungs: No use of accessory muscles, no dullness to percussion, clear without rales or rhonchi  Cardiovascular: RRR, heart sounds normal, no murmur or gallops, no peripheral edema  Musculoskeletal: No deformities, no cyanosis or clubbing  Neuro: alert, non focal  Skin: Warm, no lesions or rashes   TTE: 12/10/10  Left ventricle: The cavity size was normal. Wall thickness was normal. Systolic function was mildly reduced. The estimated ejection fraction was in the range of 45% to 50%. Wall motion was normal; there were no regional wall motion abnormalities. Doppler parameters are consistent with abnormal left ventricular relaxation (grade 1 diastolic dysfunction).    Assessment &  Plan:  DYSPNEA PFT normal - unchanged (actually better) compared w last time. Only revealing data so far has been decrease in LV fxn - plan f/u w DR Tenny Craw after TTE interpretation, possible cardiac stress test. If cardiac testing not indicated, could perform cardiopulmopnary exercise testing - I would defer this fo rnow

## 2011-01-08 NOTE — Progress Notes (Signed)
PFT done today. 

## 2011-01-10 NOTE — Progress Notes (Signed)
HPI  Patient is a 55 year old who was referred fro evaluation of CP and dsypnea with exertion. The patient has been seen by [name] in the past.  She is also followed by r. Byrum. She has undergon Rx for Breast CA with chemo and eventual Bonemarrow Tx. ON talking to the patient she has intermittennt Cp.  Dull  Can occur with and without activty.  Overeatting makes it worse.  Last  les than 1 minute.   Also notes SOB that has gotten worse over the past year. She is due to be seen in pulmonary and is also due for a PET scan.  Allergies  Allergen Reactions  . Codeine   . Cymbalta (Duloxetine Hcl)     Personality changes  . Lamictal (Lamotrigine)   . Morphine And Related   . Zoloft     Current Outpatient Prescriptions  Medication Sig Dispense Refill  . Ascorbic Acid (VITAMIN C PO) Take by mouth daily.        Marland Kitchen b complex vitamins tablet Take 1 tablet by mouth daily.        . calcium carbonate (OS-CAL) 600 MG TABS Take 600 mg by mouth 3 (three) times daily with meals.        . furosemide (LASIX) 40 MG tablet Take 40 mg by mouth daily. As needed       . HYDROcodone-acetaminophen (LORTAB) 7.5-500 MG per tablet Take 1 tablet by mouth every 6 (six) hours as needed.        Marland Kitchen levothyroxine (SYNTHROID, LEVOTHROID) 75 MCG tablet Take 75 mcg by mouth daily.        Marland Kitchen LORazepam (ATIVAN) 0.5 MG tablet Take by mouth. Take one half up to 2 tablets daily as needed.       . Multiple Vitamin (MULTIVITAMIN) tablet Take 1 tablet by mouth daily.        . vitamin E 400 UNIT capsule Take 400 Units by mouth daily.          Past Medical History  Diagnosis Date  . History of breast cancer   . Allergic rhinitis   . Tremor   . Anxiety   . Edema of lower extremity   . Neuropathy   . Dyspnea   . Hypothyroidism   . Anxiety   . Depression   . History of migraine headaches     Past Surgical History  Procedure Date  . Mastectomy, partial 1994  . Bone marrow transplant 01/95    Family History    Problem Relation Age of Onset  . COPD Mother   . Breast cancer Maternal Grandmother   . Tuberculosis Maternal Grandmother   . Stroke Maternal Grandmother   . Allergies Father   . Heart disease Father   . Stroke Father   . Heart attack Father   . Allergies Sister   . Deep vein thrombosis Sister     History   Social History  . Marital Status: Married    Spouse Name: N/A    Number of Children: N/A  . Years of Education: N/A   Occupational History  . homemaker    Social History Main Topics  . Smoking status: Never Smoker   . Smokeless tobacco: Never Used  . Alcohol Use: No  . Drug Use: No  . Sexually Active: Not on file   Other Topics Concern  . Not on file   Social History Narrative  . No narrative on file    Review of Systems:  All systems reviewed.  They are negative to the above problem except as previously stated.  Vital Signs: BP 119/71  Pulse 101  Ht 5' 5.5" (1.664 m)  Wt 150 lb (68.04 kg)  BMI 24.58 kg/m2  Physical Exam  Patient is in NAD  HEENT:  Normocephalic, atraumatic. EOMI, PERRLA.  Neck: JVP is normal. No thyromegaly. No bruits.  Lungs: clear to auscultation. No rales no wheezes.  Heart: Regular rate and rhythm. Normal S1, S2. No S3.   No significant murmurs. PMI not displaced.  Abdomen:  Supple, nontender. Normal bowel sounds. No masses. No hepatomegaly.  Extremities:   Good distal pulses throughout. No lower extremity edema.  Musculoskeletal :moving all extremities.  Neuro:   alert and oriented x3.  CN II-XII grossly intact.  EKG:  NSR.  83 bpm.  Nonspecif ST T wave changes.   Assessment and Plan:

## 2011-01-11 ENCOUNTER — Encounter (HOSPITAL_COMMUNITY): Payer: Self-pay

## 2011-01-11 ENCOUNTER — Encounter: Payer: Self-pay | Admitting: Cardiology

## 2011-01-11 ENCOUNTER — Encounter (HOSPITAL_COMMUNITY)
Admission: RE | Admit: 2011-01-11 | Discharge: 2011-01-11 | Disposition: A | Discharge status: Home / Self Care | Payer: Medicare Other | Source: Ambulatory Visit | Attending: Oncology | Admitting: Oncology

## 2011-01-11 DIAGNOSIS — Z79899 Other long term (current) drug therapy: Secondary | ICD-10-CM | POA: Insufficient documentation

## 2011-01-11 DIAGNOSIS — C50919 Malignant neoplasm of unspecified site of unspecified female breast: Secondary | ICD-10-CM | POA: Insufficient documentation

## 2011-01-11 HISTORY — DX: Malignant (primary) neoplasm, unspecified: C80.1

## 2011-01-11 LAB — GLUCOSE, CAPILLARY: Glucose-Capillary: 103 mg/dL — ABNORMAL HIGH (ref 70–99)

## 2011-01-11 MED ORDER — FLUDEOXYGLUCOSE F - 18 (FDG) INJECTION: 16.6000 | Freq: Once | INTRAVENOUS | Status: AC | PRN

## 2011-01-17 NOTE — Assessment & Plan Note (Addendum)
Patient has f/u testing in pulmonary.  I would like to review these results as well as review previous echo before scheduling further testing. Note that patient is also due to have PET scan to eval for recurrent Ca. Will consider myoview vs cath.   based on echo and other test findings.

## 2011-01-18 ENCOUNTER — Telehealth: Payer: Self-pay | Admitting: Internal Medicine

## 2011-01-18 DIAGNOSIS — Z01810 Encounter for preprocedural cardiovascular examination: Secondary | ICD-10-CM

## 2011-01-18 NOTE — Telephone Encounter (Signed)
Called patient. I have reviewed echoes for June and last year.  LVEF appears mildly down.  Different from previous    I would recomm R and L heart cath to define anatomy Patient is agreeable.  Will need precath labs Do in [facility] lab

## 2011-01-19 ENCOUNTER — Encounter: Payer: Self-pay | Admitting: *Deleted

## 2011-01-19 ENCOUNTER — Other Ambulatory Visit (INDEPENDENT_AMBULATORY_CARE_PROVIDER_SITE_OTHER): Payer: Medicare Other | Admitting: *Deleted

## 2011-01-19 DIAGNOSIS — Z01810 Encounter for preprocedural cardiovascular examination: Secondary | ICD-10-CM

## 2011-01-19 DIAGNOSIS — I428 Other cardiomyopathies: Secondary | ICD-10-CM

## 2011-01-19 DIAGNOSIS — R0602 Shortness of breath: Secondary | ICD-10-CM

## 2011-01-19 LAB — CBC WITH DIFFERENTIAL/PLATELET
Basophils Absolute: 0 10*3/uL (ref 0.0–0.1)
Basophils Relative: 0.8 % (ref 0.0–3.0)
Eosinophils Absolute: 0.1 10*3/uL (ref 0.0–0.7)
Eosinophils Relative: 1.3 % (ref 0.0–5.0)
HCT: 42.4 % (ref 36.0–46.0)
Hemoglobin: 14.3 g/dL (ref 12.0–15.0)
Lymphocytes Relative: 26.1 % (ref 12.0–46.0)
Lymphs Abs: 1.5 10*3/uL (ref 0.7–4.0)
MCHC: 33.7 g/dL (ref 30.0–36.0)
MCV: 96.9 fl (ref 78.0–100.0)
Monocytes Absolute: 0.4 10*3/uL (ref 0.1–1.0)
Monocytes Relative: 6.8 % (ref 3.0–12.0)
Neutro Abs: 3.7 10*3/uL (ref 1.4–7.7)
Neutrophils Relative %: 65 % (ref 43.0–77.0)
Platelets: 230 10*3/uL (ref 150.0–400.0)
RBC: 4.38 Mil/uL (ref 3.87–5.11)
RDW: 13.3 % (ref 11.5–14.6)
WBC: 5.7 10*3/uL (ref 4.5–10.5)

## 2011-01-19 LAB — APTT: aPTT: 26 s (ref 21.7–28.8)

## 2011-01-19 LAB — BASIC METABOLIC PANEL
BUN: 24 mg/dL — ABNORMAL HIGH (ref 6–23)
CO2: 26 mEq/L (ref 19–32)
Calcium: 9.1 mg/dL (ref 8.4–10.5)
Chloride: 107 mEq/L (ref 96–112)
Creatinine, Ser: 0.9 mg/dL (ref 0.4–1.2)
GFR: 74.13 mL/min (ref >60.00)
Glucose, Bld: 145 mg/dL — ABNORMAL HIGH (ref 70–99)
Potassium: 4.3 mEq/L (ref 3.5–5.1)
Sodium: 141 mEq/L (ref 135–145)

## 2011-01-19 LAB — PROTIME-INR
INR: 1 ratio (ref 0.8–1.0)
Prothrombin Time: 10.9 s (ref 10.2–12.4)

## 2011-01-19 NOTE — Telephone Encounter (Signed)
Pt agreed to have  cardiac cath scheduled for  Friday 01/23/11, but she wants to talk with Dr. Tenny Craw , she has many questions to ask prior cath.

## 2011-01-19 NOTE — Telephone Encounter (Signed)
Patient said she has a history of Hemorrhage. This is not in her history list.

## 2011-01-20 NOTE — Telephone Encounter (Signed)
[place] pt. 

## 2011-01-21 NOTE — Telephone Encounter (Signed)
Called patient and answered all questions about the heart cath that is scheduled for tomorrow. Also gave her results of labs from 7/24. She will advise the cath lab staff tomorrow that she has a history of bleeding post op from knee and gyn surgery.

## 2011-01-22 ENCOUNTER — Inpatient Hospital Stay (HOSPITAL_BASED_OUTPATIENT_CLINIC_OR_DEPARTMENT_OTHER)
Admission: RE | Admit: 2011-01-22 | Discharge: 2011-01-22 | Disposition: A | Discharge status: Home / Self Care | Payer: Medicare Other | Source: Ambulatory Visit | Attending: Cardiovascular Disease | Admitting: Cardiovascular Disease

## 2011-01-22 DIAGNOSIS — Z79899 Other long term (current) drug therapy: Secondary | ICD-10-CM | POA: Insufficient documentation

## 2011-01-22 DIAGNOSIS — I2510 Atherosclerotic heart disease of native coronary artery without angina pectoris: Secondary | ICD-10-CM

## 2011-01-22 DIAGNOSIS — I428 Other cardiomyopathies: Secondary | ICD-10-CM | POA: Insufficient documentation

## 2011-01-22 LAB — POCT I-STAT 3, VENOUS BLOOD GAS (G3P V)
Acid-base deficit: 1 mmol/L (ref 0.0–2.0)
Acid-base deficit: 1 mmol/L (ref 0.0–2.0)
Acid-base deficit: 1 mmol/L (ref 0.0–2.0)
Acid-base deficit: 1 mmol/L (ref 0.0–2.0)
Acid-base deficit: 2 mmol/L (ref 0.0–2.0)
Acid-base deficit: 2 mmol/L (ref 0.0–2.0)
Acid-base deficit: 3 mmol/L — ABNORMAL HIGH (ref 0.0–2.0)
Bicarbonate: 19.6 mEq/L — ABNORMAL LOW (ref 20.0–24.0)
Bicarbonate: 23.2 mEq/L (ref 20.0–24.0)
Bicarbonate: 23.7 mEq/L (ref 20.0–24.0)
Bicarbonate: 24 mEq/L (ref 20.0–24.0)
Bicarbonate: 24 mEq/L (ref 20.0–24.0)
Bicarbonate: 24.6 mEq/L — ABNORMAL HIGH (ref 20.0–24.0)
Bicarbonate: 25.2 mEq/L — ABNORMAL HIGH (ref 20.0–24.0)
O2 Saturation: 58 %
O2 Saturation: 66 %
O2 Saturation: 73 %
O2 Saturation: 74 %
O2 Saturation: 74 %
O2 Saturation: 81 %
O2 Saturation: 96 %
TCO2: 20 mmol/L (ref 0–100)
TCO2: 24 mmol/L (ref 0–100)
TCO2: 25 mmol/L (ref 0–100)
TCO2: 25 mmol/L (ref 0–100)
TCO2: 26 mmol/L (ref 0–100)
TCO2: 26 mmol/L (ref 0–100)
TCO2: 27 mmol/L (ref 0–100)
pCO2, Ven: 23.5 mmHg — ABNORMAL LOW (ref 45.0–50.0)
pCO2, Ven: 41.4 mmHg — ABNORMAL LOW (ref 45.0–50.0)
pCO2, Ven: 41.4 mmHg — ABNORMAL LOW (ref 45.0–50.0)
pCO2, Ven: 41.5 mmHg — ABNORMAL LOW (ref 45.0–50.0)
pCO2, Ven: 43.5 mmHg — ABNORMAL LOW (ref 45.0–50.0)
pCO2, Ven: 45.4 mmHg (ref 45.0–50.0)
pCO2, Ven: 50.6 mmHg — ABNORMAL HIGH (ref 45.0–50.0)
pH, Ven: 7.285 (ref 7.250–7.300)
pH, Ven: 7.353 — ABNORMAL HIGH (ref 7.250–7.300)
pH, Ven: 7.356 — ABNORMAL HIGH (ref 7.250–7.300)
pH, Ven: 7.361 — ABNORMAL HIGH (ref 7.250–7.300)
pH, Ven: 7.364 — ABNORMAL HIGH (ref 7.250–7.300)
pH, Ven: 7.372 — ABNORMAL HIGH (ref 7.250–7.300)
pH, Ven: 7.53 — ABNORMAL HIGH (ref 7.250–7.300)
pO2, Ven: 34 mmHg (ref 30.0–45.0)
pO2, Ven: 36 mmHg (ref 30.0–45.0)
pO2, Ven: 40 mmHg (ref 30.0–45.0)
pO2, Ven: 41 mmHg (ref 30.0–45.0)
pO2, Ven: 41 mmHg (ref 30.0–45.0)
pO2, Ven: 47 mmHg — ABNORMAL HIGH (ref 30.0–45.0)
pO2, Ven: 73 mmHg — ABNORMAL HIGH (ref 30.0–45.0)

## 2011-01-27 ENCOUNTER — Encounter: Payer: Self-pay | Admitting: *Deleted

## 2011-01-29 ENCOUNTER — Ambulatory Visit (HOSPITAL_COMMUNITY): Payer: Self-pay

## 2011-02-08 ENCOUNTER — Encounter (HOSPITAL_COMMUNITY): Payer: Self-pay | Admitting: Oncology

## 2011-02-08 ENCOUNTER — Encounter: Payer: Self-pay | Admitting: Internal Medicine

## 2011-02-08 ENCOUNTER — Ambulatory Visit (HOSPITAL_COMMUNITY): Payer: Self-pay

## 2011-02-08 ENCOUNTER — Encounter (HOSPITAL_COMMUNITY): Payer: Medicare Other | Attending: Oncology | Admitting: Oncology

## 2011-02-08 ENCOUNTER — Encounter: Payer: Medicare Other | Admitting: Physician Assistant

## 2011-02-08 VITALS — BP 111/70 | HR 78 | Temp 98.4°F | Wt 154.8 lb

## 2011-02-08 DIAGNOSIS — R6889 Other general symptoms and signs: Secondary | ICD-10-CM | POA: Insufficient documentation

## 2011-02-08 DIAGNOSIS — Z9889 Other specified postprocedural states: Secondary | ICD-10-CM | POA: Insufficient documentation

## 2011-02-08 DIAGNOSIS — R51 Headache: Secondary | ICD-10-CM | POA: Insufficient documentation

## 2011-02-08 DIAGNOSIS — E039 Hypothyroidism, unspecified: Secondary | ICD-10-CM | POA: Insufficient documentation

## 2011-02-08 DIAGNOSIS — F329 Major depressive disorder, single episode, unspecified: Secondary | ICD-10-CM | POA: Insufficient documentation

## 2011-02-08 DIAGNOSIS — R42 Dizziness and giddiness: Secondary | ICD-10-CM | POA: Insufficient documentation

## 2011-02-08 DIAGNOSIS — F3289 Other specified depressive episodes: Secondary | ICD-10-CM | POA: Insufficient documentation

## 2011-02-08 DIAGNOSIS — G609 Hereditary and idiopathic neuropathy, unspecified: Secondary | ICD-10-CM | POA: Insufficient documentation

## 2011-02-08 DIAGNOSIS — R5383 Other fatigue: Secondary | ICD-10-CM | POA: Insufficient documentation

## 2011-02-08 DIAGNOSIS — Z853 Personal history of malignant neoplasm of breast: Secondary | ICD-10-CM

## 2011-02-08 DIAGNOSIS — R5381 Other malaise: Secondary | ICD-10-CM | POA: Insufficient documentation

## 2011-02-08 DIAGNOSIS — Z79899 Other long term (current) drug therapy: Secondary | ICD-10-CM | POA: Insufficient documentation

## 2011-02-08 NOTE — Progress Notes (Signed)
Christina Cotta, MD [address] [place] [address]  1. BREAST CANCER, HX OF     CURRENT THERAPY:S?P bone marrow transplant on CALGB 9082 [facility] protocol.  S/P 4 cycles of CAF followed by high dose Cisplatin, Cytoxan, BCNU.  S/P right breast radiation in September 1995.    INTERVAL HISTORY: [name] 55 y.o. female returns for  regular  visit for followup of history of stage II adenocarcinoma of right breast presenting with multiple lymph nodes but no obvious primary in the right breast.  11 positive nodes.  ER positive, PR negative.  On our last visit, the patient had a number of neurological complaints.  She had an extensive work-up.  She recently had a PET scan to complete her work-up and it was negative.    The patient reports that she feels much improved.  She has stopped her Wellbutrin and Trazodone on her own and since then she has improved.  Her tremors have resolved, her speech has improved, etc.   The patient reports that she is working out at [employer]  She is happy to be off of these medications and the patient is clearly in a good state right now.  She reports that she is newly married and wants to feel "all of the feelings" associated with that and not be "drugged."   Past Medical History  Diagnosis Date  . History of breast cancer   . Allergic rhinitis   . Tremor   . Anxiety   . Edema of lower extremity   . Neuropathy   . Dyspnea   . Hypothyroidism   . Anxiety   . Depression   . History of migraine headaches   . Cancer     has ANXIETY; ALLERGIC RHINITIS; DYSPNEA; BREAST CANCER, HX OF; Weakness; Healthcare maintenance; History of bone marrow transplant; Fatigue; Peripheral neuropathy; Depression; Constipation; Right hand weakness; Chest discomfort; and Hypothyroidism on her problem list.     is allergic to codeine; cymbalta; lamictal; morphine and related; and zoloft.  [name] does not currently have medications on file.  Past Surgical  History  Procedure Date  . Mastectomy, partial 1994  . Bone marrow transplant 01/95  . Cervical conization w/bx   . Arthoscopy     rt. knee  . Axillary node dissection   . Bone marrow harvest   . Port-a-cath removed   . Hickman placed   . Bone marrow transplant   . Biopsy breast     L & R  . Combined hysteroscopy diagnostic / d&c   . Colonoscopy   . Cardiac catheterization     Denies any headaches, dizziness, double vision, fevers, chills, night sweats, nausea, vomiting, diarrhea, constipation, chest pain, heart palpitations, shortness of breath, blood in stool, black tarry stool, urinary pain, urinary burning, urinary frequency, hematuria.   PHYSICAL EXAMINATION  ECOG PERFORMANCE STATUS: 0 - Asymptomatic  Filed Vitals:   02/08/11 1440  BP: 111/70  Pulse: 78  Temp: 98.4 F (36.9 C)    GENERAL:healthy, well nourished, well developed, comfortable, cooperative and smiling SKIN: skin color, texture, turgor are normal, no rashes or significant lesions HEAD: Normocephalic, No masses, lesions, tenderness or abnormalities EYES: normal, PERRLA, EOMI EARS: External ears normal OROPHARYNX:mucous membranes are moist  NECK: supple, trachea midline LYMPH:  no palpable lymphadenopathy BREAST:not examined LUNGS: clear to auscultation and percussion HEART: regular rate & rhythm, no murmurs, no gallops, S1 normal and S2 normal ABDOMEN:abdomen soft, non-tender and normal bowel sounds  BACK: Back symmetric, no curvature., No CVA tenderness EXTREMITIES:less then 2 second capillary refill, no joint deformities, effusion, or inflammation, no edema, no skin discoloration, no clubbing, no cyanosis  NEURO: alert & oriented x 3 with fluent speech, no focal motor/sensory deficits, gait normal    LABORATORY DATA: CBC    Component Value Date/Time   WBC 5.7 01/19/2011 1123   RBC 4.38 01/19/2011 1123   HGB 14.3 01/19/2011 1123   HCT 42.4 01/19/2011 1123   PLT 230.0 01/19/2011 1123   MCV 96.9  01/19/2011 1123   MCH 32.2 12/04/2010 1241   MCHC 33.7 01/19/2011 1123   RDW 13.3 01/19/2011 1123   LYMPHSABS 1.5 01/19/2011 1123   MONOABS 0.4 01/19/2011 1123   EOSABS 0.1 01/19/2011 1123   BASOSABS 0.0 01/19/2011 1123      Chemistry      Component Value Date/Time   NA 141 01/19/2011 1123   K 4.3 01/19/2011 1123   CL 107 01/19/2011 1123   CO2 26 01/19/2011 1123   BUN 24* 01/19/2011 1123   CREATININE 0.9 01/19/2011 1123      Component Value Date/Time   CALCIUM 9.1 01/19/2011 1123   ALKPHOS 113 12/04/2010 1241   AST 21 12/04/2010 1241   ALT 11 12/04/2010 1241   BILITOT 0.5 12/04/2010 1241      RADIOGRAPHIC STUDIES:  01/11/2011  *RADIOLOGY REPORT*  Clinical Data: Initial treatment strategy for breast cancer.  NUCLEAR MEDICINE PET CT INITIAL (PI) SKULL BASE TO THIGH  Technique: 16.6 mCi F-18 FDG was injected intravenously via the  left antecubital fossa. Full-ring PET imaging was performed from  the skull base through the mid-thighs 65 minutes after injection.  CT data was obtained and used for attenuation correction and  anatomic localization only. (This was not acquired as a diagnostic  CT examination.)  Fasting Blood Glucose: 103  Patient Weight: 159 pounds.  Comparison: PET CT dated 04/13/2004  Findings: No hypermetabolic foci in the neck. No suspicious  cervical lymphadenopathy.  No hypermetabolic foci in the chest. No suspicious pulmonary  nodules. No pleural effusion or pneumothorax. The heart is top  normal in size. No pericardial effusion. Postsurgical changes in  the right breast with right axillary lymph node dissection.  Surgical clip in the medial left breast.  No hypermetabolic foci in the abdomen/pelvis. Liver, spleen,  pancreas, adrenal glands, and gallbladder are unremarkable.  Kidneys are unremarkable without hydronephrosis.  No evidence of bowel obstruction. Normal appendix. No  abdominopelvic ascites. No suspicious abdominopelvic  lymphadenopathy. Uterus is notable  for postprocedural changes. No  adnexal masses. Bladder is underdistended.  Degenerative changes of the visualized thoracolumbar spine. No  focal osseous lesions.  IMPRESSION:  No evidence of recurrent or metastatic breast cancer in the neck,  chest, abdomen, or pelvis.  Original Report Authenticated By: Charline Bills, M.D.  12/11/2010  *RADIOLOGY REPORT*  Clinical Data: Palpable lumps at left breast 12 o'clock and 6  o'clock  DIGITAL DIAGNOSTIC BILATERAL MAMMOGRAM WITH CAD AND LEFT BREAST  ULTRASOUND:  Comparison: December 09, 2009, January 08, 2009, January 02, 2008  Findings: CC and MLO views of bilateral breasts are submitted.  The stable asymmetries identified within the left breast unchanged.  The right breast is stable without suspicious findings.  Mammographic images were processed with CAD.  Ultrasound is performed, showing a 2 mm simple cyst at the left  breast 12 o'clock position 4 cm from nipple. No focal discrete  cystic or solid lesion is identified at the left breast  six o'clock  position.  IMPRESSION:  Benign findings, recommend routine screening mammogram in 1 year  BI-RADS CATEGORY 2: Benign finding(s).  Original Report Authenticated By: Sherian Rein, M.D.     ASSESSMENT:  1. Right sided breast cancer.  Stage II adenocarcinoma of right breast presenting with multiple lymph nodes but no obvious primary in the right breast.  11 positive nodes.  ER positive, PR negative. 2. Left breast fibrocystic/glandular nodule 3. Anxiety and Depression, off of Wellbutrin.  Improved 4. Hypothyroidism    PLAN:  1. Return in one year for follow-up   All questions were answered. The patient knows to call the clinic with any problems, questions or concerns. We can certainly see the patient much sooner if necessary.  The patient and plan discussed with Arlan Organ, MD and he is in agreement with the aforementioned.  I spent 25 minutes counseling the patient face to face. The  total time spent in the appointment was 40 minutes.  [name]

## 2011-02-09 ENCOUNTER — Encounter: Payer: Self-pay | Admitting: Physician Assistant

## 2011-02-09 ENCOUNTER — Ambulatory Visit (INDEPENDENT_AMBULATORY_CARE_PROVIDER_SITE_OTHER): Payer: Medicare Other | Admitting: Physician Assistant

## 2011-02-09 VITALS — BP 110/70 | HR 81 | Ht 66.0 in | Wt 154.0 lb

## 2011-02-09 DIAGNOSIS — I427 Cardiomyopathy due to drug and external agent: Secondary | ICD-10-CM | POA: Insufficient documentation

## 2011-02-09 DIAGNOSIS — Q249 Congenital malformation of heart, unspecified: Secondary | ICD-10-CM

## 2011-02-09 DIAGNOSIS — I2510 Atherosclerotic heart disease of native coronary artery without angina pectoris: Secondary | ICD-10-CM

## 2011-02-09 DIAGNOSIS — I428 Other cardiomyopathies: Secondary | ICD-10-CM

## 2011-02-09 DIAGNOSIS — E785 Hyperlipidemia, unspecified: Secondary | ICD-10-CM

## 2011-02-09 LAB — LIPID PANEL
Cholesterol: 223 mg/dL — ABNORMAL HIGH (ref 0–200)
HDL: 60.3 mg/dL (ref >39.00)
Total CHOL/HDL Ratio: 4
Triglycerides: 72 mg/dL (ref 0.0–149.0)
VLDL: 14.4 mg/dL (ref 0.0–40.0)

## 2011-02-09 LAB — LDL CHOLESTEROL, DIRECT: Direct LDL: 156.7 mg/dL

## 2011-02-09 LAB — HEPATIC FUNCTION PANEL
ALT: 24 U/L (ref 0–35)
AST: 31 U/L (ref 0–37)
Albumin: 4.4 g/dL (ref 3.5–5.2)
Alkaline Phosphatase: 112 U/L (ref 39–117)
Bilirubin, Direct: 0.1 mg/dL (ref 0.0–0.3)
Total Bilirubin: 0.8 mg/dL (ref 0.3–1.2)
Total Protein: 7.2 g/dL (ref 6.0–8.3)

## 2011-02-09 MED ORDER — BISOPROLOL FUMARATE 5 MG PO TABS: ORAL_TABLET | ORAL | Status: DC

## 2011-02-09 MED ORDER — PRAVASTATIN SODIUM 20 MG PO TABS: 20.0000 mg | ORAL_TABLET | Freq: Every evening | ORAL | Status: DC

## 2011-02-09 NOTE — Assessment & Plan Note (Signed)
Stable.  She is not having angina.  I asked her to start taking aspirin 81 mg a day.  I also think that she would benefit from statin therapy.  I will place her on pravastatin 20 mg q.h.s.  She will get baseline lipids and LFTs today.  She will need follow up lipids and LFTs in 2 months.

## 2011-02-09 NOTE — Patient Instructions (Signed)
Your physician recommends that you schedule a follow-up appointment in: 3-4 WEEKS WITH DR. ROSS ONLY AS PER SCOTT WEAVER, PA-C  Your physician recommends that you return for lab work in: TODAY FASTING LIVER/LIPID PANEL 272.4  Your physician recommends that you return for lab work in: REPEAT FASTING LIVER/LIPID PANEL IN 2 MONTHS  Your physician has recommended you make the following change in your medication: START BISOPROLOL 5 MG TABLET TAKE ONLY 1/2 (HALF) TABLET DAILY; START PRAVASTATIN 20 MG EVERY NIGHT; START ASPIRIN 81 MG DAILY  A COPY OF YOUR CATH AND ECHO REPORT HAVE BEEN GIVEN TO YOU TODAY AS PER YOUR REQUEST.

## 2011-02-09 NOTE — Assessment & Plan Note (Addendum)
As noted we had a long discussion regarding this diagnosis.  I discussed with her the importance of beta blockers and ACE inhibitors.  I believe that she would do okay with a selective beta blocker like bisoprolol given her lung problems.  Therefore, I will place on a very low dose of bisoprolol 2.5 mg a day.  Hopefully, she will be able to tolerate this.  If she can tolerate this and her blood pressure will allow, we can certainly try to get her on an ACE inhibitor in the near future.  I will bring her back and close followup with Dr. Tenny Craw in the next several weeks.

## 2011-02-09 NOTE — Progress Notes (Signed)
History of Present Illness: Primary Cardiologist:  Dr. Dietrich Pates  [name] is a 55 y.o. female who presents for post hospital follow up.  She was evaluated by Dr. Tenny Craw on 7/23 for chest pain and dyspnea.  She has a history of breast cancer status post chemotherapy and eventual bone marrow transplant at [facility].  She had PFTs in 7/12 that demonstrated small airways obstructive defect and mod decreased DLCO.  An echocardiogram in 1/11 was normal.  An echocardiogram done 6/12 demonstrated reduced LV function with an EF of 45-50% and grade 1 diastolic dysfunction.  Dr. Tenny Craw decided that the patient should undergo right and left heart catheterization.    R heart cath done 7/16.  This demonstrated a 50% proximal LAD lesion.  Otherwise, her coronary arteries were normal.  Her EF was 40-45%.  Her right heart pressures were normal with no evidence of pulmonary hypertension and no elevation in the wedge pressure.  She did appear to have a congenital anomaly with at least a left sided SVC going into the coronary sinus.  Her right-sided saturations were somewhat elevated, although they seem to be elevated uniformly from the PA, RA and RV.  Dr. Eden Emms who performed a cardiac catheterization suggests possibly proceeding with a bubble study from the left arm possibly a cardiac MRI or CT.   I discussed the patient's case with Dr. Tenny Craw yesterday.  She would like to review the studies first.  At this point in time, we will not arrange a bubble study or MRI or CT.  The patient does need advancement of treatment for her nonischemic cardiomyopathy.  I had a long discussion with the patient today regarding her cardiac catheterization.  Dr. Eden Emms spoke with her after her cath and mentioned that she might need a bubble study or MRI or CT.  I tried to answer all of her questions today regarding this.  Unfortunately, I could not answer her questions regarding her anomalous SVC.  I will try to get Dr. Tenny Craw to get in touch with  the patient to discuss this with her further.  I will also get her in for earlier followup.  We had a long discussion regarding treatment for her cardiomyopathy.  I discussed with her the importance of beta blockers and ACE inhibitors.  I also discussed with her the need for cholesterol therapy given her coronary plaquing.  We also discussed the need for aspirin.  She denies chest pain.  She notes dyspnea with exertion it is fairly stable.  She exercises on a daily basis.  She really describes him I took 2 symptoms.  She denies orthopnea or PND.  She denies syncope.  Past Medical History  Diagnosis Date  . Allergic rhinitis   . Tremor   . Edema of lower extremity   . Neuropathy   . Dyspnea   . Hypothyroidism   . Anxiety   . Depression   . History of migraine headaches   . Breast cancer   . Nonischemic cardiomyopathy     echocardiogram done 6/12 demonstrated reduced LV function with an EF of 45-50% and grade 1 diastolic dysfunction  . CAD (coronary artery disease)     Nonobstructive by cath 7/12:  50% proximal LAD  . Congenital anomaly of superior vena cava     R Ht cath 7/12:  NL pressures, no pulmonary hypertension and normal wedge pressure; probable congenital anomaly with at least a left sided SVC going into the coronary sinus.  Right-sided sats were  somewhat elevated, although they seem to be elevated uniformly from the PA, RA and RV.  consider bubble study from left arm or cardiac MRI or CT    Current Outpatient Prescriptions  Medication Sig Dispense Refill  . Ascorbic Acid (VITAMIN C PO) Take by mouth daily.        Marland Kitchen b complex vitamins tablet Take 1 tablet by mouth daily.        . calcium carbonate (OS-CAL) 600 MG TABS Take 600 mg by mouth 3 (three) times daily with meals.        . furosemide (LASIX) 40 MG tablet Take 40 mg by mouth daily. As needed       . HYDROcodone-acetaminophen (LORTAB) 7.5-500 MG per tablet Take 1 tablet by mouth every 6 (six) hours as needed.        Marland Kitchen  levothyroxine (SYNTHROID, LEVOTHROID) 75 MCG tablet Take 75 mcg by mouth daily.        . Multiple Vitamin (MULTIVITAMIN) tablet Take 1 tablet by mouth daily.        . vitamin E 400 UNIT capsule Take 400 Units by mouth daily.        Marland Kitchen aspirin EC 81 MG tablet Take 1 tablet (81 mg total) by mouth daily.      . bisoprolol (ZEBETA) 5 MG tablet TAKE 1/2 TABLET DAILY  30 tablet  11  . pravastatin (PRAVACHOL) 20 MG tablet Take 1 tablet (20 mg total) by mouth every evening.  30 tablet  11    Allergies: Allergies  Allergen Reactions  . Codeine   . Cymbalta (Duloxetine Hcl)     Personality changes  . Lamictal (Lamotrigine)   . Morphine And Related   . Zoloft     Vital Signs: BP 110/70  Pulse 81  Ht 5\' 6"  (1.676 m)  Wt 154 lb (69.854 kg)  BMI 24.86 kg/m2  PHYSICAL EXAM: Well nourished, well developed, in no acute distress HEENT: normal Neck: no JVD Cardiac:  normal S1, S2; RRR; no murmur Lungs:  clear to auscultation bilaterally, no wheezing, rhonchi or rales Abd: soft, nontender, no hepatomegaly Ext: no edema; RFA site without hematoma or bruit Skin: warm and dry Neuro:  CNs 2-12 intact, no focal abnormalities noted  ASSESSMENT AND PLAN:

## 2011-02-09 NOTE — Assessment & Plan Note (Signed)
I tried to answer as many of her questions today regarding this.  She was under the impression she should get set up for a CT or MRI based upon her discussion with Dr. Eden Emms.  I explained to her that I discussed this with Dr. Tenny Craw.  We do not plan to order any further testing at this time.  As noted, I will try to get Dr. Tenny Craw to get in touch with the patient to answer more questions.  As noted, I will bring the patient back in close followup with Dr. Tenny Craw in the next several weeks.

## 2011-02-10 ENCOUNTER — Telehealth: Payer: Self-pay | Admitting: Internal Medicine

## 2011-02-10 ENCOUNTER — Encounter: Payer: Self-pay | Admitting: *Deleted

## 2011-02-10 ENCOUNTER — Telehealth: Payer: Self-pay | Admitting: *Deleted

## 2011-02-10 DIAGNOSIS — E785 Hyperlipidemia, unspecified: Secondary | ICD-10-CM

## 2011-02-10 NOTE — Telephone Encounter (Signed)
LEFT MESSAGE ON AM FOR PT TO HAVE REPEAT LIPID PANEL IN 2-3 MONTHS .[name]

## 2011-02-10 NOTE — Telephone Encounter (Signed)
lmom for ptcb for lab results. Carol Fiato  

## 2011-02-10 NOTE — Telephone Encounter (Addendum)
PT CALLED RE RECENT LABS  REFUSES TO START CHOLESTEROL MED AT THIS TIME  WILL TRY DIET AND EXERCISE  PT  STATED  ALREADY INFORMED SCOTT WEAVER PAC OF THIS AT OFFICE  VISIT PT HAS HISTORY OF BREAST CA AND WAS   TAKING EXPERIMENTAL DRUG WHICH  DID NO DAMAGE  TO LIVER OR KIDNEYS AT THAT TIME AND THEREFORE DOES NOT WANT TO RISK CAUSING ANY TYPE OF LIVER DAMAGE .CY

## 2011-02-10 NOTE — Telephone Encounter (Signed)
Taking a statin would benefit the patient given coronary plaque noted on cardiac catheterization.  If she does not want to take pravastatin, she does not need followup LFTs in 2 months.  If she would like to try diet and exercise, she can have a followup lipid panel in 2-3 months.

## 2011-02-10 NOTE — Telephone Encounter (Signed)
Test results

## 2011-02-12 ENCOUNTER — Telehealth: Payer: Self-pay | Admitting: *Deleted

## 2011-02-12 NOTE — Telephone Encounter (Signed)
Called patient and offered her an earlier appointment with Dr.Ross but she declined. She states that she is exercising and has refused to take any medications that Tereso Newcomer gave her. Dr.Ross is aware of this.

## 2011-02-15 ENCOUNTER — Telehealth: Payer: Self-pay | Admitting: Internal Medicine

## 2011-02-15 NOTE — Telephone Encounter (Signed)
Called patient. She received letter dated 8/15 with her lab results and the total and LDL cholesterol was missing. Advised that I would send her an actual copy of the entire Lipid panel so that she would have it for her records.

## 2011-02-15 NOTE — Telephone Encounter (Signed)
Pt calling re letter with the  results of lab work, the numbers don't match what she was told

## 2011-02-16 NOTE — Cardiovascular Report (Signed)
NAMERICHIE, VADALA                 ACCOUNT NO.:  [id number]  MEDICAL RECORD NO.:  [id number]  LOCATION:  XRAY                         FACILITY:  [facility]  PHYSICIAN:  Noralyn Pick. Eden Emms, MD, FACCDATE OF BIRTH:  August 03, 1955  DATE OF PROCEDURE: DATE OF DISCHARGE:  01/11/2011                           CARDIAC CATHETERIZATION   INDICATIONS:  Right and left heart cath done for cardiomyopathy.  The patient has had previous chemotherapy.  EF by echo estimated to 45% having shortness of breath.  Cine catheterization was done with 5-French catheter from the right femoral artery with a 4-French catheter from the right femoral artery and the 7-French catheter from right femoral vein.  Left main coronary artery was normal.  The left anterior descending artery was somewhat diminutive.  The proximal portion appeared calcified.  There is a 50% tubular lesion in the proximal vessel.  I did not think this area was flow-limiting as the entire LAD was somewhat diminutive.  There was a single septal perforators seen.  The patient had very diminutive diagonal branches two proximally and one distally which were less than 1-mm vessels.  The patient was left coronary artery dominant.  The circumflex coronary artery was large, it was normal.  There was a very large branching obtuse marginal branch which supplied most of the lateral and anterolateral wall of the heart and actually reached the apex.  It was normal.  The right coronary artery was normal and nondominant.  RAO ventriculography showed mild-to-moderate LV cavity dilatation with diffuse hypokinesis.  EF was in the 40-45% range.  The patient's right heart catheterization was somewhat interesting.  The patient appeared to have a left-sided SVC that connected to the coronary sinus sats from this left-sided venous vessel were quite low at 58%.  The patient's normal right heart pressures included the following; mean right atrial pressure was 5,  RV pressure was 25/7, PA pressure was 20/9 with a mean of 13, mean pulmonary capillary wedge pressure was 7.  LV pressure is 133/12 with a post A-wave EDP of 25, aortic pressure was 135/68 with a mean of 94.  Cardiac output was 4.7 L/min with a cardiac index of 2.7 L/min per meter squared.  The sat run included the following; aortic saturation was 98%, pulmonary capillary wedge sat was 96%, left-sided SVC was 58%, right-sided SVC was 73%, right atrial sat was 74%, RV sat was 66%, PA sat was 74% and IVC was 81%.  IMPRESSION:  The patient has moderate calcific disease in the diminutive proximal LAD which I do not think is flow-limiting.  She has diffuse hypokinesis which is likely from previous chemotherapy, cardiomyopathy and continued therapy for cardiomyopathy is in order.  Her right heart pressures were normal with no evidence of pulmonary hypertension and no elevation in the wedge pressure.  Interestingly, she appears to have a congenital anomaly with at least a left-sided SVC going into the coronary sinus.  Her right-sided saturations are somewhat elevated, although they seemed to be elevated uniformly from the PA, RA and RV.  I will talk to Dr. Tenny Craw about this.  It maybe worthwhile to further investigate her large vessel anatomy by doing a bubble study from  the left arm and also possibly a cardiac MRI or CT.  Her sat run is not totally consistent with an atrial septal defect as her IVC sats were also quite high.  Overall, she tolerated the procedure well.     Noralyn Pick. Eden Emms, MD, [facility]     PCN/MEDQ  D:  01/22/2011  T:  01/22/2011  Job:  [id number]  cc:   Pricilla Riffle, MD, [facility]  Electronically Signed by Charlton Haws MD [facility] on 02/16/2011 09:20:20 AM

## 2011-03-12 ENCOUNTER — Encounter: Payer: Self-pay | Admitting: Internal Medicine

## 2011-03-12 ENCOUNTER — Telehealth: Payer: Self-pay | Admitting: Internal Medicine

## 2011-03-12 ENCOUNTER — Ambulatory Visit (INDEPENDENT_AMBULATORY_CARE_PROVIDER_SITE_OTHER): Payer: Medicare Other | Admitting: Internal Medicine

## 2011-03-12 VITALS — BP 90/60 | HR 79 | Temp 98.7°F | Resp 16 | Ht 65.5 in | Wt 159.0 lb

## 2011-03-12 DIAGNOSIS — E785 Hyperlipidemia, unspecified: Secondary | ICD-10-CM

## 2011-03-12 DIAGNOSIS — G4700 Insomnia, unspecified: Secondary | ICD-10-CM

## 2011-03-12 DIAGNOSIS — Z23 Encounter for immunization: Secondary | ICD-10-CM

## 2011-03-12 DIAGNOSIS — I2510 Atherosclerotic heart disease of native coronary artery without angina pectoris: Secondary | ICD-10-CM

## 2011-03-12 MED ORDER — PRAVASTATIN SODIUM 20 MG PO TABS: 20.0000 mg | ORAL_TABLET | Freq: Every evening | ORAL | Status: DC

## 2011-03-12 NOTE — Assessment & Plan Note (Signed)
Will begin pravastatin 20mg  po qd. Schedule f/u lipid/lft.

## 2011-03-12 NOTE — Progress Notes (Signed)
Subjective:    Patient ID: [name], female    DOB: 1956/05/09, 55 y.o.   MRN: [id number]  HPI Pt presents to clinic for followup of multiple medical problems. Reviewed cardiac cath and dx of nonobstructive CAD. Was recommended to take asa daily, zebeta and pravastatin but has not begun. Has insomnia and wonders if related to menopausal sx's. Previously intolerant of ambien. Successfully weaned off anti-depressant and trazodone without difficulty and feels better. No active complaints.  Past Medical History  Diagnosis Date  . Allergic rhinitis   . Tremor   . Edema of lower extremity   . Neuropathy   . Dyspnea   . Hypothyroidism   . Anxiety   . Depression   . History of migraine headaches   . Breast cancer   . Nonischemic cardiomyopathy     echocardiogram done 6/12 demonstrated reduced LV function with an EF of 45-50% and grade 1 diastolic dysfunction  . CAD (coronary artery disease)     Nonobstructive by cath 7/12:  50% proximal LAD  . Congenital anomaly of superior vena cava     R Ht cath 7/12:  NL pressures, no pulmonary hypertension and normal wedge pressure; probable congenital anomaly with at least a left sided SVC going into the coronary sinus.  Right-sided sats were somewhat elevated, although they seem to be elevated uniformly from the PA, RA and RV.  consider bubble study from left arm or cardiac MRI or CT   Past Surgical History  Procedure Date  . Mastectomy, partial 1994  . Bone marrow transplant 01/95  . Cervical conization w/bx   . Arthoscopy     rt. knee  . Axillary node dissection   . Bone marrow harvest   . Port-a-cath removed   . Hickman placed   . Bone marrow transplant   . Biopsy breast     L & R  . Combined hysteroscopy diagnostic / d&c   . Colonoscopy   . Cardiac catheterization     reports that she has never smoked. She has never used smokeless tobacco. She reports that she does not drink alcohol or use illicit drugs. family history includes  Allergies in her father and sister; Breast cancer in her maternal grandmother; COPD in her mother; Deep vein thrombosis in her sister; Heart attack in her father; Heart disease in her father; Stroke in her father and maternal grandmother; and Tuberculosis in her maternal grandmother. Allergies  Allergen Reactions  . Codeine   . Cymbalta (Duloxetine Hcl)     Personality changes  . Lamictal (Lamotrigine)   . Morphine And Related   . Zoloft      Review of Systems see hpi     Objective:   Physical Exam  Physical Exam  Nursing note and vitals reviewed. Constitutional: Appears well-developed and well-nourished. No distress.  HENT:  Head: Normocephalic and atraumatic.  Right Ear: External ear normal.  Left Ear: External ear normal.  Eyes: Conjunctivae are normal. No scleral icterus.  Neck: Neck supple. Carotid bruit is not present.  Cardiovascular: Normal rate, regular rhythm and normal heart sounds.  Exam reveals no gallop and no friction rub.   No murmur heard. Pulmonary/Chest: Effort normal and breath sounds normal. No respiratory distress. He has no wheezes. no rales.  Lymphadenopathy:    He has no cervical adenopathy.  Neurological:Alert.  Skin: Skin is warm and dry. Not diaphoretic.  Psychiatric: Has a normal mood and affect.        Assessment & Plan:

## 2011-03-12 NOTE — Assessment & Plan Note (Signed)
Asx. Agrees to begin daily coated asa.

## 2011-03-12 NOTE — Telephone Encounter (Signed)
Patient needs labs 2 weeks prior to 05/21/11 appt. Patient will be going to the [place] lab.   Lipid/lft- no code was given to me.

## 2011-03-12 NOTE — Telephone Encounter (Signed)
Lab order in place already for [facility].

## 2011-03-12 NOTE — Assessment & Plan Note (Signed)
Given h/o CAD and breast cancer do not recommend HRT for menopausal sx's. Intolerant of ambien. Attempt melatonin 1-3mg  po qhs prn.

## 2011-03-12 NOTE — Patient Instructions (Signed)
Please schedule lipid/lft in approximately 8wks (272.4)

## 2011-03-15 ENCOUNTER — Telehealth: Payer: Self-pay | Admitting: *Deleted

## 2011-03-15 NOTE — Telephone Encounter (Signed)
Call returned to patient at [phone], no answer. A detailed voice message was left informing patient per Dr Rodena Medin instructions.

## 2011-03-15 NOTE — Telephone Encounter (Signed)
Patient states she received a flu shot last week.She stated Friday afternoon, she developed a really bad headache, and just feels achy all over. She stated she has reviewed the VIS for the Flu, and feels that she may be having a reaction the the flu shot. She would like to know if she should treat her symptoms,and if she should be worried about how she is feeling.

## 2011-03-15 NOTE — Telephone Encounter (Signed)
There is the possibility of low grade temp, myalgias, and flu like sx's for some people generally the first 48 hours or so after receiving the vaccination. Is usually the immune system responding to the vaccine. Usually tx the sx's and resolves on its on relatively quickly. Re-eval if no resolution within a few days- there are multiple other infxns in the community currently

## 2011-03-20 ENCOUNTER — Inpatient Hospital Stay (INDEPENDENT_AMBULATORY_CARE_PROVIDER_SITE_OTHER)
Admission: RE | Admit: 2011-03-20 | Discharge: 2011-03-20 | Disposition: A | Discharge status: Home / Self Care | Payer: Medicare Other | Source: Ambulatory Visit | Attending: Family Medicine | Admitting: Family Medicine

## 2011-03-20 DIAGNOSIS — J40 Bronchitis, not specified as acute or chronic: Secondary | ICD-10-CM

## 2011-03-20 DIAGNOSIS — J019 Acute sinusitis, unspecified: Secondary | ICD-10-CM

## 2011-03-20 DIAGNOSIS — B370 Candidal stomatitis: Secondary | ICD-10-CM

## 2011-03-25 ENCOUNTER — Telehealth: Payer: Self-pay | Admitting: Internal Medicine

## 2011-03-25 LAB — DIFFERENTIAL
Basophils Absolute: 0
Basophils Relative: 1
Eosinophils Absolute: 0
Eosinophils Relative: 1
Lymphocytes Relative: 26
Lymphs Abs: 1.7
Monocytes Absolute: 0.5
Monocytes Relative: 8
Neutro Abs: 4.2
Neutrophils Relative %: 66

## 2011-03-25 LAB — COMPREHENSIVE METABOLIC PANEL
ALT: 58 — ABNORMAL HIGH
AST: 57 — ABNORMAL HIGH
Albumin: 4.1
Alkaline Phosphatase: 92
BUN: 19
CO2: 30
Calcium: 8.9
Chloride: 100
Creatinine, Ser: 0.77
GFR calc Af Amer: >60
GFR calc non Af Amer: >60
Glucose, Bld: 92
Potassium: 3.6
Sodium: 137
Total Bilirubin: 0.7
Total Protein: 7.1

## 2011-03-25 LAB — CBC
HCT: 43
Hemoglobin: 14.8
MCHC: 34.3
MCV: 99.8
Platelets: 220
RBC: 4.31
RDW: 13.4
WBC: 6.4

## 2011-03-25 NOTE — Telephone Encounter (Signed)
Pt is sick and is taking antibiotics She wants to know if she needs to come in tomorrow please call

## 2011-03-25 NOTE — Telephone Encounter (Signed)
Would expect at least some improvement. May need cxr and appt

## 2011-03-25 NOTE — Telephone Encounter (Signed)
Call placed to patient at [phone], she was informed per Dr Rodena Medin instructions. She has scheduled for Friday 9/28 @ 2:30p with Dr Rodena Medin.

## 2011-03-25 NOTE — Telephone Encounter (Signed)
Called patient back. She had a flu shot and had a reaction with a severe headache and bronchitis. She is taking an antibiotic and cough medication. Cancelled her appointment with Dr.Ross for tomorrow. She will call back to schedule to see Dr.Ross when she feels better. Will let Dr.Ross know that she called.

## 2011-03-25 NOTE — Telephone Encounter (Signed)
She went to [place] urgent care on Saturday   They said she has bronchitis sinusitis and thrush they gave her 875 mg 2 times a day and tussalon pearls for her cough.  She still feels crummy.  It hurts to take a deep breath.  Should she be feeling better by now or should she be seen again

## 2011-03-26 ENCOUNTER — Ambulatory Visit: Payer: Medicare Other | Admitting: Internal Medicine

## 2011-04-08 ENCOUNTER — Other Ambulatory Visit: Payer: Medicare Other | Admitting: *Deleted

## 2011-05-21 ENCOUNTER — Ambulatory Visit: Payer: Medicare Other | Admitting: Internal Medicine

## 2011-06-07 ENCOUNTER — Ambulatory Visit (HOSPITAL_BASED_OUTPATIENT_CLINIC_OR_DEPARTMENT_OTHER)
Admission: RE | Admit: 2011-06-07 | Discharge: 2011-06-07 | Disposition: A | Discharge status: Home / Self Care | Payer: Medicare Other | Source: Ambulatory Visit | Attending: Internal Medicine | Admitting: Internal Medicine

## 2011-06-07 ENCOUNTER — Encounter: Payer: Self-pay | Admitting: Internal Medicine

## 2011-06-07 ENCOUNTER — Ambulatory Visit (INDEPENDENT_AMBULATORY_CARE_PROVIDER_SITE_OTHER): Payer: Medicare Other | Admitting: Internal Medicine

## 2011-06-07 VITALS — BP 100/60 | HR 85 | Temp 98.5°F | Resp 20 | Wt 163.0 lb

## 2011-06-07 DIAGNOSIS — F32A Depression, unspecified: Secondary | ICD-10-CM

## 2011-06-07 DIAGNOSIS — R109 Unspecified abdominal pain: Secondary | ICD-10-CM

## 2011-06-07 DIAGNOSIS — B373 Candidiasis of vulva and vagina: Secondary | ICD-10-CM

## 2011-06-07 DIAGNOSIS — E039 Hypothyroidism, unspecified: Secondary | ICD-10-CM

## 2011-06-07 DIAGNOSIS — R110 Nausea: Secondary | ICD-10-CM

## 2011-06-07 DIAGNOSIS — F329 Major depressive disorder, single episode, unspecified: Secondary | ICD-10-CM

## 2011-06-07 LAB — CBC WITH DIFFERENTIAL/PLATELET
Basophils Absolute: 0.1 10*3/uL (ref 0.0–0.1)
Basophils Relative: 1 % (ref 0–1)
Eosinophils Absolute: 0.1 10*3/uL (ref 0.0–0.7)
Eosinophils Relative: 1 % (ref 0–5)
HCT: 43.2 % (ref 36.0–46.0)
Hemoglobin: 14.3 g/dL (ref 12.0–15.0)
Lymphocytes Relative: 33 % (ref 12–46)
Lymphs Abs: 2.3 10*3/uL (ref 0.7–4.0)
MCH: 31.7 pg (ref 26.0–34.0)
MCHC: 33.1 g/dL (ref 30.0–36.0)
MCV: 95.8 fL (ref 78.0–100.0)
Monocytes Absolute: 0.5 10*3/uL (ref 0.1–1.0)
Monocytes Relative: 8 % (ref 3–12)
Neutro Abs: 3.9 10*3/uL (ref 1.7–7.7)
Neutrophils Relative %: 57 % (ref 43–77)
Platelets: 240 10*3/uL (ref 150–400)
RBC: 4.51 MIL/uL (ref 3.87–5.11)
RDW: 12.9 % (ref 11.5–15.5)
WBC: 6.9 10*3/uL (ref 4.0–10.5)

## 2011-06-07 LAB — BASIC METABOLIC PANEL
BUN: 21 mg/dL (ref 6–23)
CO2: 28 mEq/L (ref 19–32)
Calcium: 9.1 mg/dL (ref 8.4–10.5)
Chloride: 105 mEq/L (ref 96–112)
Creat: 0.84 mg/dL (ref 0.50–1.10)
Glucose, Bld: 79 mg/dL (ref 70–99)
Potassium: 4.1 mEq/L (ref 3.5–5.3)
Sodium: 141 mEq/L (ref 135–145)

## 2011-06-07 LAB — AMYLASE: Amylase: 57 U/L (ref 0–105)

## 2011-06-07 LAB — HEPATIC FUNCTION PANEL
ALT: 19 U/L (ref 0–35)
AST: 25 U/L (ref 0–37)
Albumin: 4.1 g/dL (ref 3.5–5.2)
Alkaline Phosphatase: 105 U/L (ref 39–117)
Bilirubin, Direct: 0.1 mg/dL (ref 0.0–0.3)
Indirect Bilirubin: 0.3 mg/dL (ref 0.0–0.9)
Total Bilirubin: 0.4 mg/dL (ref 0.3–1.2)
Total Protein: 6.4 g/dL (ref 6.0–8.3)

## 2011-06-07 LAB — TSH: TSH: 0.636 u[IU]/mL (ref 0.350–4.500)

## 2011-06-07 LAB — LIPASE: Lipase: 18 U/L (ref 0–75)

## 2011-06-07 MED ORDER — DEXLANSOPRAZOLE 60 MG PO CPDR: 60.0000 mg | DELAYED_RELEASE_CAPSULE | Freq: Every day | ORAL | Status: AC

## 2011-06-07 MED ORDER — FLUCONAZOLE 150 MG PO TABS: 150.0000 mg | ORAL_TABLET | ORAL | Status: AC

## 2011-06-07 MED ORDER — BUPROPION HCL ER (XL) 150 MG PO TB24: 150.0000 mg | ORAL_TABLET | Freq: Every day | ORAL | Status: DC

## 2011-06-07 NOTE — Progress Notes (Signed)
Subjective:    Patient ID: [name], female    DOB: Mar 25, 1956, 55 y.o.   MRN: [id number]  HPI Pt presents to clinic for evaluation of abdominal pain. Notes 4-6wk h/o abdominal cramping and bloating without radiation. +nausea without emesis fro 4 days.. No diarrhea but has had mild constipation. Location is bilateral lower quandrant. No urinary sx's. Attempted maalox and peptobismol. Notes previous h/o h. Pylori s/p tx. Also notes h/o depressive sx's off wellbutrin. Previously tolerated well and believes may need to resume. Notes recent sx's c/w with yeast vaginitis. Recently tx'ed by gyn with single dose diflucan. No other complaints.  Past Medical History  Diagnosis Date  . Allergic rhinitis   . Tremor   . Edema of lower extremity   . Neuropathy   . Dyspnea   . Hypothyroidism   . Anxiety   . Depression   . History of migraine headaches   . Breast cancer   . Nonischemic cardiomyopathy     echocardiogram done 6/12 demonstrated reduced LV function with an EF of 45-50% and grade 1 diastolic dysfunction  . CAD (coronary artery disease)     Nonobstructive by cath 7/12:  50% proximal LAD  . Congenital anomaly of superior vena cava     R Ht cath 7/12:  NL pressures, no pulmonary hypertension and normal wedge pressure; probable congenital anomaly with at least a left sided SVC going into the coronary sinus.  Right-sided sats were somewhat elevated, although they seem to be elevated uniformly from the PA, RA and RV.  consider bubble study from left arm or cardiac MRI or CT   Past Surgical History  Procedure Date  . Mastectomy, partial 1994  . Bone marrow transplant 01/95  . Cervical conization w/bx   . Arthoscopy     rt. knee  . Axillary node dissection   . Bone marrow harvest   . Port-a-cath removed   . Hickman placed   . Bone marrow transplant   . Biopsy breast     L & R  . Combined hysteroscopy diagnostic / d&c   . Colonoscopy   . Cardiac catheterization     reports that  she has never smoked. She has never used smokeless tobacco. She reports that she does not drink alcohol or use illicit drugs. family history includes Allergies in her father and sister; Breast cancer in her maternal grandmother; COPD in her mother; Deep vein thrombosis in her sister; Heart attack in her father; Heart disease in her father; Stroke in her father and maternal grandmother; and Tuberculosis in her maternal grandmother. Allergies  Allergen Reactions  . Codeine   . Cymbalta (Duloxetine Hcl)     Personality changes  . Lamictal (Lamotrigine)   . Morphine And Related   . Zoloft      Review of Systems see hpi     Objective:   Physical Exam  Nursing note and vitals reviewed. Constitutional: She appears well-developed and well-nourished. No distress.  HENT:  Head: Normocephalic and atraumatic.  Right Ear: External ear normal.  Left Ear: External ear normal.  Eyes: Conjunctivae are normal. No scleral icterus.  Abdominal: Soft. Bowel sounds are normal. She exhibits no distension and no mass. There is no tenderness. There is no rebound and no guarding.  Neurological: She is alert.  Skin: Skin is warm and dry. She is not diaphoretic.  Psychiatric: She has a normal mood and affect. Her behavior is normal. Thought content normal.  Assessment & Plan:

## 2011-06-08 ENCOUNTER — Telehealth: Payer: Self-pay | Admitting: *Deleted

## 2011-06-08 LAB — URINALYSIS, ROUTINE W REFLEX MICROSCOPIC
Bilirubin Urine: NEGATIVE
Glucose, UA: NEGATIVE mg/dL
Hgb urine dipstick: NEGATIVE
Ketones, ur: NEGATIVE mg/dL
Nitrite: NEGATIVE
Protein, ur: NEGATIVE mg/dL
Specific Gravity, Urine: 1.027 (ref 1.005–1.030)
Urobilinogen, UA: 0.2 mg/dL (ref 0.0–1.0)
pH: 5.5 (ref 5.0–8.0)

## 2011-06-08 LAB — URINALYSIS, MICROSCOPIC ONLY
Bacteria, UA: NONE SEEN
Casts: NONE SEEN

## 2011-06-08 MED ORDER — CIPROFLOXACIN HCL 500 MG PO TABS: 500.0000 mg | ORAL_TABLET | Freq: Two times a day (BID) | ORAL | Status: DC

## 2011-06-08 NOTE — Telephone Encounter (Signed)
See lab note 06/07/2011. Rx for Cipro sent to pharmacy.

## 2011-06-12 DIAGNOSIS — B373 Candidiasis of vulva and vagina: Secondary | ICD-10-CM | POA: Insufficient documentation

## 2011-06-12 DIAGNOSIS — R109 Unspecified abdominal pain: Secondary | ICD-10-CM | POA: Insufficient documentation

## 2011-06-12 NOTE — Assessment & Plan Note (Signed)
Attempt ppi qd. Obtain plain xray of abd. Obtain cbc, chem7, amylase, lipase, lft and ua. Followup if no improvement or worsening.

## 2011-06-12 NOTE — Assessment & Plan Note (Signed)
reattempt diflucan. Followup if no improvement or worsening.

## 2011-06-12 NOTE — Assessment & Plan Note (Signed)
Obtain tsh  

## 2011-06-12 NOTE — Assessment & Plan Note (Signed)
Resume wellbutrin 

## 2011-06-14 ENCOUNTER — Ambulatory Visit (INDEPENDENT_AMBULATORY_CARE_PROVIDER_SITE_OTHER): Payer: Medicare Other | Admitting: Internal Medicine

## 2011-06-14 ENCOUNTER — Encounter: Payer: Self-pay | Admitting: Internal Medicine

## 2011-06-14 ENCOUNTER — Ambulatory Visit: Payer: Medicare Other | Admitting: Internal Medicine

## 2011-06-14 VITALS — BP 106/80 | HR 91 | Temp 97.9°F | Resp 18 | Wt 165.0 lb

## 2011-06-14 DIAGNOSIS — N390 Urinary tract infection, site not specified: Secondary | ICD-10-CM

## 2011-06-14 DIAGNOSIS — R109 Unspecified abdominal pain: Secondary | ICD-10-CM

## 2011-06-14 MED ORDER — LEVOTHYROXINE SODIUM 75 MCG PO TABS: 75.0000 ug | ORAL_TABLET | Freq: Every day | ORAL | Status: DC

## 2011-06-14 MED ORDER — TRAZODONE HCL 50 MG PO TABS: 50.0000 mg | ORAL_TABLET | Freq: Every day | ORAL | Status: DC

## 2011-06-14 MED ORDER — NITROFURANTOIN MONOHYD MACRO 100 MG PO CAPS: 100.0000 mg | ORAL_CAPSULE | Freq: Two times a day (BID) | ORAL | Status: AC

## 2011-06-16 ENCOUNTER — Telehealth: Payer: Self-pay | Admitting: *Deleted

## 2011-06-16 NOTE — Telephone Encounter (Signed)
Patient called and left voice message requesting a copy of her lab results from 06/07/2011 be mailed to her. Copy printed and mailed to address on file.

## 2011-06-17 ENCOUNTER — Telehealth: Payer: Self-pay | Admitting: *Deleted

## 2011-06-17 NOTE — Telephone Encounter (Signed)
Patient called with c/o bad tremors in both hands, mild discomfort in upper back, weak and off balance.  Having hard time not falling. Onset 2 days ago.Headaches 5/10, aching in neck on right side, with shooting pain. She seems to think that her symptoms are getting worse. She would like to know what Dr Rodena Medin advises.

## 2011-06-17 NOTE — Telephone Encounter (Signed)
Call placed to patient at [phone], she was informed per Dr Rodena Medin instructions. Patient was offered appointment, which she declines. She states she has too much leg weakness and does not think she would be able to drive herself to office. Patient was advised if she is having extreme weakness to contact EMS for transportation to ER for evaluation. Patient has verbalized understanding and agrees.

## 2011-06-17 NOTE — Telephone Encounter (Signed)
These are not the sx's i saw her for the last two appts. Needs to be seen or ED if develops syncope or stroke like sx's (arm/leg weakness, numbness, difficulty with speech)

## 2011-06-20 DIAGNOSIS — N390 Urinary tract infection, site not specified: Secondary | ICD-10-CM | POA: Insufficient documentation

## 2011-06-20 NOTE — Progress Notes (Signed)
  Subjective:    Patient ID: [name], female    DOB: [birth year], 55 y.o.   MRN: [id number]  HPI Pt presents to clinic for follow up of abdominal pain. UA was + with associated urinary sx's. Finishing cipro with resolution of urinary sx's but wonders if cipro causing a very mild rash.  Continues with abdominal pain previously bilateral lower quandrants now noted to be epigastric. Only minimal improvement with dexilant qd. Now also notes fatigue and light headedness without presyncope or syncope. Not worsened by head position change. Interested in decreasing trazadone dosing.  Past Medical History  Diagnosis Date  . Allergic rhinitis   . Tremor   . Edema of lower extremity   . Neuropathy   . Dyspnea   . Hypothyroidism   . Anxiety   . Depression   . History of migraine headaches   . Breast cancer   . Nonischemic cardiomyopathy     echocardiogram done 6/12 demonstrated reduced LV function with an EF of 45-50% and grade 1 diastolic dysfunction  . CAD (coronary artery disease)     Nonobstructive by cath 7/12:  50% proximal LAD  . Congenital anomaly of superior vena cava     R Ht cath 7/12:  NL pressures, no pulmonary hypertension and normal wedge pressure; probable congenital anomaly with at least a left sided SVC going into the coronary sinus.  Right-sided sats were somewhat elevated, although they seem to be elevated uniformly from the PA, RA and RV.  consider bubble study from left arm or cardiac MRI or CT   Past Surgical History  Procedure Date  . Mastectomy, partial 1994  . Bone marrow transplant 01/95  . Cervical conization w/bx   . Arthoscopy     rt. knee  . Axillary node dissection   . Bone marrow harvest   . Port-a-cath removed   . Hickman placed   . Bone marrow transplant   . Biopsy breast     L & R  . Combined hysteroscopy diagnostic / d&c   . Colonoscopy   . Cardiac catheterization     reports that she has never smoked. She has never used smokeless tobacco. She  reports that she does not drink alcohol or use illicit drugs. family history includes Allergies in her father and sister; Breast cancer in her maternal grandmother; COPD in her mother; Deep vein thrombosis in her sister; Heart attack in her father; Heart disease in her father; Stroke in her father and maternal grandmother; and Tuberculosis in her maternal grandmother. Allergies  Allergen Reactions  . Codeine   . Cymbalta (Duloxetine Hcl)     Personality changes  . Lamictal (Lamotrigine)   . Morphine And Related   . Zoloft      Review of Systems see hpi     Objective:   Physical Exam  Nursing note and vitals reviewed. Constitutional: She appears well-developed and well-nourished. No distress.  HENT:  Head: Normocephalic and atraumatic.  Eyes: Conjunctivae are normal. No scleral icterus.  Abdominal: Soft. Bowel sounds are normal. She exhibits no distension and no mass. There is tenderness in the epigastric area. There is no rebound and no guarding.  Neurological: She is alert.  Skin: Skin is warm and dry. She is not diaphoretic.  Psychiatric: She has a normal mood and affect.          Assessment & Plan:

## 2011-06-20 NOTE — Assessment & Plan Note (Signed)
abd pain persistent x 6+wks. Labs unrevealing. Inadequate improvement with ppi. GI consult

## 2011-06-20 NOTE — Assessment & Plan Note (Signed)
Improving. ?sl rash with cipro. Change to macrobid. Followup if no improvement or worsening.

## 2011-06-30 ENCOUNTER — Other Ambulatory Visit (HOSPITAL_COMMUNITY): Payer: Self-pay | Admitting: Gastroenterology

## 2011-06-30 DIAGNOSIS — R4702 Dysphasia: Secondary | ICD-10-CM

## 2011-06-30 DIAGNOSIS — R102 Pelvic and perineal pain: Secondary | ICD-10-CM

## 2011-07-05 ENCOUNTER — Encounter: Payer: Self-pay | Admitting: Internal Medicine

## 2011-07-05 ENCOUNTER — Ambulatory Visit (INDEPENDENT_AMBULATORY_CARE_PROVIDER_SITE_OTHER): Payer: Medicare Other | Admitting: Internal Medicine

## 2011-07-05 ENCOUNTER — Encounter: Payer: Self-pay | Admitting: Neurology

## 2011-07-05 DIAGNOSIS — N390 Urinary tract infection, site not specified: Secondary | ICD-10-CM

## 2011-07-05 DIAGNOSIS — R5381 Other malaise: Secondary | ICD-10-CM

## 2011-07-05 DIAGNOSIS — G629 Polyneuropathy, unspecified: Secondary | ICD-10-CM

## 2011-07-05 DIAGNOSIS — R531 Weakness: Secondary | ICD-10-CM

## 2011-07-05 DIAGNOSIS — G589 Mononeuropathy, unspecified: Secondary | ICD-10-CM

## 2011-07-05 DIAGNOSIS — R5383 Other fatigue: Secondary | ICD-10-CM

## 2011-07-06 ENCOUNTER — Ambulatory Visit (HOSPITAL_COMMUNITY)
Admission: RE | Admit: 2011-07-06 | Discharge: 2011-07-06 | Disposition: A | Discharge status: Home / Self Care | Payer: Medicare Other | Source: Ambulatory Visit | Attending: Gastroenterology | Admitting: Gastroenterology

## 2011-07-06 DIAGNOSIS — R109 Unspecified abdominal pain: Secondary | ICD-10-CM | POA: Insufficient documentation

## 2011-07-06 DIAGNOSIS — Z853 Personal history of malignant neoplasm of breast: Secondary | ICD-10-CM | POA: Insufficient documentation

## 2011-07-06 DIAGNOSIS — R4702 Dysphasia: Secondary | ICD-10-CM

## 2011-07-06 DIAGNOSIS — R102 Pelvic and perineal pain: Secondary | ICD-10-CM

## 2011-07-06 DIAGNOSIS — N949 Unspecified condition associated with female genital organs and menstrual cycle: Secondary | ICD-10-CM | POA: Insufficient documentation

## 2011-07-06 LAB — URINALYSIS, ROUTINE W REFLEX MICROSCOPIC
Bilirubin Urine: NEGATIVE
Glucose, UA: NEGATIVE mg/dL
Hgb urine dipstick: NEGATIVE
Ketones, ur: NEGATIVE mg/dL
Leukocytes, UA: NEGATIVE
Nitrite: NEGATIVE
Protein, ur: NEGATIVE mg/dL
Specific Gravity, Urine: 1.024 (ref 1.005–1.030)
Urobilinogen, UA: 0.2 mg/dL (ref 0.0–1.0)
pH: 5.5 (ref 5.0–8.0)

## 2011-07-06 MED ORDER — IOHEXOL 300 MG/ML  SOLN
100.0000 mL | Freq: Once | INTRAMUSCULAR | Status: AC | PRN
  Administered 2011-07-06: 100 mL via INTRAVENOUS

## 2011-07-07 ENCOUNTER — Ambulatory Visit: Payer: Medicare Other | Admitting: Gastroenterology

## 2011-07-07 LAB — URINE CULTURE: Colony Count: 3000

## 2011-07-10 NOTE — Assessment & Plan Note (Signed)
Neurology consult

## 2011-07-10 NOTE — Assessment & Plan Note (Signed)
Obtain ua and cx. 

## 2011-07-10 NOTE — Progress Notes (Signed)
  Subjective:    Patient ID: [name], female    DOB: 08-Mar-1956, 56 y.o.   MRN: [id number]  HPI Pt presents to clinic for followup of multiple medical problems. S/p abx course for UTI. Improved. Feels like she has minimal sx's persisting. No f/c. Seeing GI for persistent abdominal pain. Relates longstanding h/o neuropathy dating back to breast cancer treatment. However also notes increasing unsteadiness recently and intermittent episodes of leg dragging. Previously followed by outside neurology but no recent evaluation and finds it difficult to get appt.   Past Medical History  Diagnosis Date  . Allergic rhinitis   . Tremor   . Edema of lower extremity   . Neuropathy   . Dyspnea   . Hypothyroidism   . Anxiety   . Depression   . History of migraine headaches   . Breast cancer   . Nonischemic cardiomyopathy     echocardiogram done 6/12 demonstrated reduced LV function with an EF of 45-50% and grade 1 diastolic dysfunction  . CAD (coronary artery disease)     Nonobstructive by cath 7/12:  50% proximal LAD  . Congenital anomaly of superior vena cava     R Ht cath 7/12:  NL pressures, no pulmonary hypertension and normal wedge pressure; probable congenital anomaly with at least a left sided SVC going into the coronary sinus.  Right-sided sats were somewhat elevated, although they seem to be elevated uniformly from the PA, RA and RV.  consider bubble study from left arm or cardiac MRI or CT   Past Surgical History  Procedure Date  . Mastectomy, partial 1994  . Bone marrow transplant 01/95  . Cervical conization w/bx   . Arthoscopy     rt. knee  . Axillary node dissection   . Bone marrow harvest   . Port-a-cath removed   . Hickman placed   . Bone marrow transplant   . Biopsy breast     L & R  . Combined hysteroscopy diagnostic / d&c   . Colonoscopy   . Cardiac catheterization     reports that she has never smoked. She has never used smokeless tobacco. She reports that she does  not drink alcohol or use illicit drugs. family history includes Allergies in her father and sister; Breast cancer in her maternal grandmother; COPD in her mother; Deep vein thrombosis in her sister; Heart attack in her father; Heart disease in her father; Stroke in her father and maternal grandmother; and Tuberculosis in her maternal grandmother. Allergies  Allergen Reactions  . Codeine   . Cymbalta (Duloxetine Hcl)     Personality changes  . Lamictal (Lamotrigine)   . Morphine And Related   . Zoloft       Review of Systems see hpi     Objective:   Physical Exam  Nursing note and vitals reviewed. Constitutional: She appears well-developed and well-nourished. No distress.  HENT:  Head: Normocephalic and atraumatic.  Right Ear: External ear normal.  Left Ear: External ear normal.  Eyes: Conjunctivae are normal. No scleral icterus.  Abdominal: Soft. Bowel sounds are normal. She exhibits no distension. There is tenderness.    Neurological: She is alert.  Skin: Skin is warm and dry. She is not diaphoretic.  Psychiatric: She has a normal mood and affect.          Assessment & Plan:

## 2011-07-15 ENCOUNTER — Other Ambulatory Visit: Payer: Self-pay | Admitting: Gastroenterology

## 2011-08-12 ENCOUNTER — Ambulatory Visit: Payer: Medicare Other | Admitting: Neurology

## 2011-09-14 ENCOUNTER — Telehealth: Payer: Self-pay | Admitting: *Deleted

## 2011-09-14 ENCOUNTER — Encounter: Payer: Self-pay | Admitting: Internal Medicine

## 2011-09-14 ENCOUNTER — Other Ambulatory Visit: Payer: Self-pay | Admitting: Internal Medicine

## 2011-09-14 DIAGNOSIS — I2510 Atherosclerotic heart disease of native coronary artery without angina pectoris: Secondary | ICD-10-CM

## 2011-09-14 NOTE — Telephone Encounter (Signed)
Try 1/2 of 40mg  qd. Office visit if dyspnea with the swelling. ?cardiologist is she requesting. Has seen [place]

## 2011-09-14 NOTE — Telephone Encounter (Signed)
Error

## 2011-09-14 NOTE — Telephone Encounter (Signed)
Call placed to patient at [phone], she was informed per Dr Rodena Medin instructions,and has verbalized understanding. She states that she had and episode last week of shortness of breath with swelling, but it is not present now. She stated she is seeing Dr Tenny Craw with [facility], and wanted to see Dr Clarise Cruz, but he is not accepting new patients.

## 2011-09-14 NOTE — Telephone Encounter (Signed)
Patient call requesting a referral to another cardiologist for a second opinion on her heart condition. She is also complaining that she is having lower extremity swelling, which she feels is related to her heart conditions. She states she has an old Rx for Lasix 40mg , but that is causing her sever fatigue. She would like to know if she could cut back on dose or try something else.

## 2011-09-27 ENCOUNTER — Encounter: Payer: Self-pay | Admitting: Internal Medicine

## 2011-09-27 ENCOUNTER — Ambulatory Visit (INDEPENDENT_AMBULATORY_CARE_PROVIDER_SITE_OTHER): Payer: Medicare Other | Admitting: Internal Medicine

## 2011-09-27 VITALS — BP 100/72 | HR 90 | Temp 97.7°F | Resp 18 | Wt 160.0 lb

## 2011-09-27 DIAGNOSIS — J329 Chronic sinusitis, unspecified: Secondary | ICD-10-CM

## 2011-09-27 DIAGNOSIS — R5383 Other fatigue: Secondary | ICD-10-CM

## 2011-09-27 DIAGNOSIS — E785 Hyperlipidemia, unspecified: Secondary | ICD-10-CM

## 2011-09-27 DIAGNOSIS — R0989 Other specified symptoms and signs involving the circulatory and respiratory systems: Secondary | ICD-10-CM

## 2011-09-27 DIAGNOSIS — R0600 Dyspnea, unspecified: Secondary | ICD-10-CM

## 2011-09-27 DIAGNOSIS — R5381 Other malaise: Secondary | ICD-10-CM

## 2011-09-27 DIAGNOSIS — R0609 Other forms of dyspnea: Secondary | ICD-10-CM

## 2011-09-27 DIAGNOSIS — R0602 Shortness of breath: Secondary | ICD-10-CM

## 2011-09-27 LAB — CBC WITH DIFFERENTIAL/PLATELET
Basophils Absolute: 0.1 10*3/uL (ref 0.0–0.1)
Basophils Relative: 1 % (ref 0–1)
Eosinophils Absolute: 0.1 10*3/uL (ref 0.0–0.7)
Eosinophils Relative: 2 % (ref 0–5)
HCT: 44.6 % (ref 36.0–46.0)
Hemoglobin: 14.5 g/dL (ref 12.0–15.0)
Lymphocytes Relative: 30 % (ref 12–46)
Lymphs Abs: 1.7 10*3/uL (ref 0.7–4.0)
MCH: 31.9 pg (ref 26.0–34.0)
MCHC: 32.5 g/dL (ref 30.0–36.0)
MCV: 98 fL (ref 78.0–100.0)
Monocytes Absolute: 0.5 10*3/uL (ref 0.1–1.0)
Monocytes Relative: 9 % (ref 3–12)
Neutro Abs: 3.3 10*3/uL (ref 1.7–7.7)
Neutrophils Relative %: 58 % (ref 43–77)
Platelets: 235 10*3/uL (ref 150–400)
RBC: 4.55 MIL/uL (ref 3.87–5.11)
RDW: 13 % (ref 11.5–15.5)
WBC: 5.7 10*3/uL (ref 4.0–10.5)

## 2011-09-27 LAB — LIPID PANEL
Cholesterol: 209 mg/dL — ABNORMAL HIGH (ref 0–200)
HDL: 50 mg/dL (ref >39)
LDL Cholesterol: 143 mg/dL — ABNORMAL HIGH (ref 0–99)
Total CHOL/HDL Ratio: 4.2 Ratio
Triglycerides: 79 mg/dL (ref <150)
VLDL: 16 mg/dL (ref 0–40)

## 2011-09-27 LAB — TSH: TSH: 0.951 u[IU]/mL (ref 0.350–4.500)

## 2011-09-27 LAB — T4, FREE: Free T4: 1.5 ng/dL (ref 0.80–1.80)

## 2011-09-27 MED ORDER — LEVOTHYROXINE SODIUM 75 MCG PO TABS: 75.0000 ug | ORAL_TABLET | Freq: Every day | ORAL | Status: DC

## 2011-09-27 MED ORDER — FLUTICASONE PROPIONATE 50 MCG/ACT NA SUSP: 2.0000 | Freq: Every day | NASAL | Status: DC

## 2011-09-27 MED ORDER — BUPROPION HCL ER (XL) 300 MG PO TB24: 300.0000 mg | ORAL_TABLET | Freq: Every day | ORAL | Status: DC

## 2011-09-27 MED ORDER — ALPRAZOLAM 0.25 MG PO TBDP: 0.2500 mg | ORAL_TABLET | Freq: Three times a day (TID) | ORAL | Status: DC | PRN

## 2011-09-27 MED ORDER — FUROSEMIDE 40 MG PO TABS: 20.0000 mg | ORAL_TABLET | Freq: Every day | ORAL | Status: DC

## 2011-09-27 MED ORDER — LEVOFLOXACIN 500 MG PO TABS: 500.0000 mg | ORAL_TABLET | Freq: Every day | ORAL | Status: AC

## 2011-09-28 LAB — BRAIN NATRIURETIC PEPTIDE: Brain Natriuretic Peptide: 32 pg/mL (ref 0.0–100.0)

## 2011-09-30 ENCOUNTER — Ambulatory Visit (INDEPENDENT_AMBULATORY_CARE_PROVIDER_SITE_OTHER): Payer: Medicare Other | Admitting: Neurology

## 2011-09-30 ENCOUNTER — Encounter: Payer: Self-pay | Admitting: Neurology

## 2011-09-30 VITALS — BP 116/78 | HR 84 | Ht 65.5 in | Wt 161.0 lb

## 2011-09-30 DIAGNOSIS — F329 Major depressive disorder, single episode, unspecified: Secondary | ICD-10-CM

## 2011-09-30 DIAGNOSIS — F32A Depression, unspecified: Secondary | ICD-10-CM

## 2011-09-30 DIAGNOSIS — G609 Hereditary and idiopathic neuropathy, unspecified: Secondary | ICD-10-CM

## 2011-09-30 DIAGNOSIS — F3289 Other specified depressive episodes: Secondary | ICD-10-CM

## 2011-09-30 DIAGNOSIS — R52 Pain, unspecified: Secondary | ICD-10-CM
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
## 2011-09-30 NOTE — Patient Instructions (Signed)
Your appointment for the nerve conduction studies is scheduled for Monday, 05-17-2024at 1:15pm.   [facility] [address] [place], [place] [phone].  Dr. Dawayne Cirri office will call you to schedule that appointment.  We will call you with the appointment to [facility] as soon as we have it.

## 2011-10-03 DIAGNOSIS — J329 Chronic sinusitis, unspecified: Secondary | ICD-10-CM | POA: Insufficient documentation

## 2011-10-03 DIAGNOSIS — R0600 Dyspnea, unspecified: Secondary | ICD-10-CM | POA: Insufficient documentation

## 2011-10-03 NOTE — Assessment & Plan Note (Signed)
Obtain bnp 

## 2011-10-03 NOTE — Assessment & Plan Note (Signed)
Obtain cbc, tsh and ft4.

## 2011-10-03 NOTE — Assessment & Plan Note (Signed)
Attempt levaquin and flonase. Followup if no improvement or worsening.

## 2011-10-03 NOTE — Progress Notes (Signed)
  Subjective:    Patient ID: [name], female    DOB: April 02, 1956, 56 y.o.   MRN: [id number]  HPI Pt presents to clinic for followup of multiple medical problems. Notes improvement of le swelling with lasix 20mg . C/o chronic fatigue and feels like wellbutrin dose may not be effective. Previously took zoloft and is avoiding ssri's. Notes bilateral maxillary pressure, ha's and teeth pain. Notes intermittent dyspnea with her edema. No cp  Past Medical History  Diagnosis Date  . Allergic rhinitis   . Tremor   . Edema of lower extremity   . Neuropathy   . Dyspnea   . Hypothyroidism   . Anxiety   . Depression   . History of migraine headaches   . Breast cancer   . Nonischemic cardiomyopathy     echocardiogram done 6/12 demonstrated reduced LV function with an EF of 45-50% and grade 1 diastolic dysfunction  . CAD (coronary artery disease)     Nonobstructive by cath 7/12:  50% proximal LAD  . Congenital anomaly of superior vena cava     R Ht cath 7/12:  NL pressures, no pulmonary hypertension and normal wedge pressure; probable congenital anomaly with at least a left sided SVC going into the coronary sinus.  Right-sided sats were somewhat elevated, although they seem to be elevated uniformly from the PA, RA and RV.  consider bubble study from left arm or cardiac MRI or CT   Past Surgical History  Procedure Date  . Mastectomy, partial 1994  . Bone marrow transplant 01/95  . Cervical conization w/bx   . Arthoscopy     rt. knee  . Axillary node dissection   . Bone marrow harvest   . Port-a-cath removed   . Hickman placed   . Bone marrow transplant   . Biopsy breast     L & R  . Combined hysteroscopy diagnostic / d&c   . Colonoscopy   . Cardiac catheterization     reports that she has never smoked. She has never used smokeless tobacco. She reports that she does not drink alcohol or use illicit drugs. family history includes Allergies in her father and sister; Breast cancer in her  maternal grandmother; COPD in her mother; Deep vein thrombosis in her sister; Heart attack in her father; Heart disease in her father; Stroke in her father and maternal grandmother; and Tuberculosis in her maternal grandmother. Allergies  Allergen Reactions  . Codeine   . Cymbalta (Duloxetine Hcl)     Personality changes  . Lamictal (Lamotrigine)   . Morphine And Related   . Zoloft       Review of Systems see hpi     Objective:   Physical Exam  Physical Exam  Nursing note and vitals reviewed. Constitutional: Appears well-developed and well-nourished. No distress.  HENT:  Head: Normocephalic and atraumatic.  Right Ear: External ear normal.  Left Ear: External ear normal.  Eyes: Conjunctivae are normal. No scleral icterus.  Neck: Neck supple. Carotid bruit is not present.  Cardiovascular: Normal rate, regular rhythm and normal heart sounds.  Exam reveals no gallop and no friction rub.   No murmur heard. Pulmonary/Chest: Effort normal and breath sounds normal. No respiratory distress. He has no wheezes. no rales.  Lymphadenopathy:    He has no cervical adenopathy.  Neurological:Alert.  Skin: Skin is warm and dry. Not diaphoretic.  Psychiatric: Has a normal mood and affect.        Assessment & Plan:

## 2011-10-03 NOTE — Assessment & Plan Note (Signed)
Obtain lipid 

## 2011-10-05 ENCOUNTER — Other Ambulatory Visit: Payer: Self-pay | Admitting: *Deleted

## 2011-10-05 MED ORDER — TRAZODONE HCL 50 MG PO TABS: 50.0000 mg | ORAL_TABLET | Freq: Every day | ORAL | Status: DC

## 2011-10-05 NOTE — Telephone Encounter (Signed)
Patient called and left voice message requesting a refill on Trazodone.

## 2011-10-05 NOTE — Telephone Encounter (Signed)
[id number]

## 2011-10-05 NOTE — Telephone Encounter (Signed)
Call placed to patient at [phone], she was informed Rx sent to pharmacy.

## 2011-10-25 ENCOUNTER — Ambulatory Visit (INDEPENDENT_AMBULATORY_CARE_PROVIDER_SITE_OTHER): Payer: Medicare Other | Admitting: Cardiology

## 2011-10-25 ENCOUNTER — Encounter: Payer: Self-pay | Admitting: Cardiology

## 2011-10-25 VITALS — BP 125/85 | HR 85 | Ht 65.0 in | Wt 156.8 lb

## 2011-10-25 DIAGNOSIS — R0789 Other chest pain: Secondary | ICD-10-CM

## 2011-10-25 DIAGNOSIS — E785 Hyperlipidemia, unspecified: Secondary | ICD-10-CM

## 2011-10-25 DIAGNOSIS — R5383 Other fatigue: Secondary | ICD-10-CM

## 2011-10-25 DIAGNOSIS — R5381 Other malaise: Secondary | ICD-10-CM

## 2011-10-25 DIAGNOSIS — I428 Other cardiomyopathies: Secondary | ICD-10-CM

## 2011-10-25 DIAGNOSIS — I2510 Atherosclerotic heart disease of native coronary artery without angina pectoris: Secondary | ICD-10-CM

## 2011-10-25 DIAGNOSIS — R531 Weakness: Secondary | ICD-10-CM

## 2011-10-25 NOTE — Assessment & Plan Note (Signed)
She can continue with risk reduction. No further change in therapy is indicated.

## 2011-10-25 NOTE — Assessment & Plan Note (Signed)
Her lipids are not at target. However, she is very sensitive to medications and she and I discussed this and we will not start a statin.

## 2011-10-25 NOTE — Progress Notes (Signed)
HPI The patient presents for her initial relation with me. She has a history of a nonischemic cardiomyopathy. This is thought to be related to chemotherapy she had in the past for breast cancer. She has had a heart catheterization last year. This demonstrated 50% LAD stenosis. Her EF was about 40-45% with some cavity dilatation. 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  It was suggested that she might need an MRI or echo with bubbles to further evaluate. However, this was never arranged. She was seen back last summer in this office. She was to be taking a beta blocker but refused because her blood pressure runs low at times. She presented to me for further evaluation.  She has chronic dyspnea and weakness. These have been slowly progressive. She'll get tired making a bed. She'll get short of breath with activity such as walking a moderate distance on level ground though she is not describing PND or orthopnea. She does get chest discomfort with activity. This is somewhat sharp. It has been relatively unchanged pattern over time. She does not have any resting complaints. She doesn't describe palpitations, presyncope or syncope. She does have some mild ankle and feet swelling. I do note her recent BNP which was normal.  Allergies  Allergen Reactions  . Codeine   . Cymbalta (Duloxetine Hcl)     Personality changes  . Lamictal (Lamotrigine)   . Morphine And Related   . Sertraline Hcl     Current Outpatient Prescriptions  Medication Sig Dispense Refill  . ALPRAZolam (NIRAVAM) 0.25 MG dissolvable tablet Take 1 tablet (0.25 mg total) by mouth 3 (three) times daily as needed.  30 tablet  3  . Ascorbic Acid (VITAMIN C PO) Take by mouth daily.        Marland Kitchen b complex vitamins tablet Take 1 tablet by mouth daily.        Marland Kitchen buPROPion (WELLBUTRIN XL) 300 MG 24 hr tablet Take 1 tablet (300 mg total) by mouth daily.  30 tablet  6  . calcium carbonate (OS-CAL) 600 MG  TABS Take 600 mg by mouth 3 (three) times daily with meals.        . fluticasone (FLONASE) 50 MCG/ACT nasal spray Place 2 sprays into the nose daily.  16 g  6  . furosemide (LASIX) 40 MG tablet Take 0.5 tablets (20 mg total) by mouth daily. As needed  30 tablet  3  . HYDROcodone-acetaminophen (NORCO) 10-325 MG per tablet Take 1 tablet by mouth every 6 (six) hours as needed.      Marland Kitchen levothyroxine (SYNTHROID, LEVOTHROID) 75 MCG tablet Take 1 tablet (75 mcg total) by mouth daily.  90 tablet  1  . magnesium oxide (MAG-OX) 400 MG tablet Take 400 mg by mouth daily.        . methocarbamol (ROBAXIN) 500 MG tablet Take 500 mg by mouth as needed.      . Multiple Vitamin (MULTIVITAMIN) tablet Take 1 tablet by mouth daily.        . traZODone (DESYREL) 50 MG tablet Take 1 tablet (50 mg total) by mouth at bedtime.  90 tablet  4  . vitamin E 400 UNIT capsule Take 400 Units by mouth daily.          Past Medical History  Diagnosis Date  . Allergic rhinitis   . Tremor   . Edema of lower extremity   . Neuropathy   . Dyspnea   . Hypothyroidism   .  Anxiety   . Depression   . History of migraine headaches   . Breast cancer   . Nonischemic cardiomyopathy     echocardiogram done 6/12 demonstrated reduced LV function with an EF of 45-50% and grade 1 diastolic dysfunction  . CAD (coronary artery disease)     Nonobstructive by cath 7/12:  50% proximal LAD  . Congenital anomaly of superior vena cava     R Ht cath 7/12:  NL pressures, no pulmonary hypertension and normal wedge pressure; probable congenital anomaly with at least a left sided SVC going into the coronary sinus.  Right-sided sats were somewhat elevated, although they seem to be elevated uniformly from the PA, RA and RV.  consider bubble study from left arm or cardiac MRI or CT    Past Surgical History  Procedure Date  . Mastectomy, partial 1994  . Bone marrow transplant 01/95  . Cervical conization w/bx   . Arthoscopy     rt. knee  . Axillary  node dissection   . Bone marrow harvest   . Port-a-cath removed   . Hickman placed   . Bone marrow transplant   . Biopsy breast     L & R  . Combined hysteroscopy diagnostic / d&c   . Colonoscopy   . Cardiac catheterization     ROS:  As stated in the HPI and negative for all other systems.  PHYSICAL EXAM BP 125/85  Pulse 85  Ht 5\' 5"  (1.651 m)  Wt 156 lb 12.8 oz (71.124 kg)  BMI 26.09 kg/m2 GENERAL:  Well appearing HEENT:  Pupils equal round and reactive, fundi not visualized, oral mucosa unremarkable NECK:  No jugular venous distention, waveform within normal limits, carotid upstroke brisk and symmetric, no bruits, no thyromegaly LYMPHATICS:  No cervical, inguinal adenopathy LUNGS:  Clear to auscultation bilaterally BACK:  No CVA tenderness CHEST:  Unremarkable HEART:  PMI not displaced or sustained,S1 and S2 within normal limits, no S3, no S4, no clicks, no rubs, no murmurs ABD:  Flat, positive bowel sounds normal in frequency in pitch, no bruits, no rebound, no guarding, no midline pulsatile mass, no hepatomegaly, no splenomegaly EXT:  2 plus pulses throughout, no edema, no cyanosis no clubbing SKIN:  No rashes no nodules NEURO:  Cranial nerves II through XII grossly intact, motor grossly intact throughout PSYCH:  Cognitively intact, oriented to person place and time, flat affect  EKG:  Sinus rhythm, rate 85, axis within normal limits, intervals within normal limits, no acute ST-T wave changes.  ASSESSMENT AND PLAN

## 2011-10-25 NOTE — Assessment & Plan Note (Signed)
This is her predominant complaint as above. She is to see a psychologist as depression is likely playing a role.  I will defer further work up to her primary provider.

## 2011-10-25 NOTE — Patient Instructions (Signed)
The current medical regimen is effective;  continue present plan and medications.  You will be called once the cardiac cath from 12/2010 has been reviewed.

## 2011-10-25 NOTE — Assessment & Plan Note (Signed)
I will repeat an echocardiogram with bubbles to evaluate her apparent congenital anomaly. Further evaluation will be based on this. At this point however I think it's unlikely that her predominant complaint of fatigue is related to her cardiac issues.

## 2011-10-26 ENCOUNTER — Telehealth: Payer: Self-pay | Admitting: *Deleted

## 2011-10-26 DIAGNOSIS — I428 Other cardiomyopathies: Secondary | ICD-10-CM

## 2011-10-26 NOTE — Telephone Encounter (Signed)
Per Dr Antoine Poche after reviewing cardiac cath from 01/11/2011 he would like for the patient to have a Bubble Study. Pt is aware and is agreeable.  She will be called with the appointment.

## 2011-10-29 ENCOUNTER — Other Ambulatory Visit: Payer: Self-pay | Admitting: Otolaryngology

## 2011-10-29 DIAGNOSIS — R51 Headache: Secondary | ICD-10-CM

## 2011-11-01 ENCOUNTER — Ambulatory Visit
Admission: RE | Admit: 2011-11-01 | Discharge: 2011-11-01 | Disposition: A | Discharge status: Home / Self Care | Payer: Medicare Other | Source: Ambulatory Visit | Attending: Otolaryngology | Admitting: Otolaryngology

## 2011-11-01 DIAGNOSIS — R51 Headache: Secondary | ICD-10-CM

## 2011-11-04 ENCOUNTER — Other Ambulatory Visit: Payer: Self-pay

## 2011-11-04 ENCOUNTER — Ambulatory Visit (HOSPITAL_COMMUNITY): Payer: Medicare Other | Attending: Cardiology

## 2011-11-04 ENCOUNTER — Encounter (HOSPITAL_COMMUNITY): Payer: Self-pay | Admitting: *Deleted

## 2011-11-04 DIAGNOSIS — R079 Chest pain, unspecified: Secondary | ICD-10-CM | POA: Insufficient documentation

## 2011-11-04 DIAGNOSIS — C50919 Malignant neoplasm of unspecified site of unspecified female breast: Secondary | ICD-10-CM | POA: Insufficient documentation

## 2011-11-04 DIAGNOSIS — R072 Precordial pain: Secondary | ICD-10-CM

## 2011-11-04 DIAGNOSIS — Q269 Congenital malformation of great vein, unspecified: Secondary | ICD-10-CM | POA: Insufficient documentation

## 2011-11-04 DIAGNOSIS — E785 Hyperlipidemia, unspecified: Secondary | ICD-10-CM | POA: Insufficient documentation

## 2011-11-04 DIAGNOSIS — I2510 Atherosclerotic heart disease of native coronary artery without angina pectoris: Secondary | ICD-10-CM | POA: Insufficient documentation

## 2011-11-04 DIAGNOSIS — I428 Other cardiomyopathies: Secondary | ICD-10-CM

## 2011-11-04 NOTE — Progress Notes (Signed)
Patient ID: DAMARI HILTZ, female   DOB: 18-Jun-1956, 56 y.o.   MRN: [id number] 22 G Angiocath started in Left hand x 1 for bubble study.. Site unremarkable.  Bonnita Levan, RN

## 2011-11-08 ENCOUNTER — Ambulatory Visit (INDEPENDENT_AMBULATORY_CARE_PROVIDER_SITE_OTHER): Payer: Medicare Other | Admitting: Internal Medicine

## 2011-11-08 ENCOUNTER — Encounter: Payer: Self-pay | Admitting: Internal Medicine

## 2011-11-08 ENCOUNTER — Telehealth: Payer: Self-pay | Admitting: Cardiology

## 2011-11-08 VITALS — BP 118/80 | HR 77 | Temp 98.4°F | Resp 18 | Ht 65.5 in | Wt 158.0 lb

## 2011-11-08 DIAGNOSIS — F411 Generalized anxiety disorder: Secondary | ICD-10-CM

## 2011-11-08 DIAGNOSIS — L719 Rosacea, unspecified: Secondary | ICD-10-CM

## 2011-11-08 MED ORDER — ALPRAZOLAM 0.25 MG PO TBDP: 0.2500 mg | ORAL_TABLET | Freq: Three times a day (TID) | ORAL | Status: DC | PRN

## 2011-11-08 MED ORDER — METRONIDAZOLE 1 % EX GEL: Freq: Every day | CUTANEOUS | Status: DC

## 2011-11-08 NOTE — Progress Notes (Signed)
  Subjective:    Patient ID: [name], female    DOB: Aug 12, 1955, 56 y.o.   MRN: [id number]  HPI Pt presents to clinic for followup of multiple medical problems. Notes increased neck pain after helping lift her sister. Seeing orthopedics s/p mri and now referred to pain management. Attempting to avoid surgery and scheduled for epidural injection. States increase in welbutrin last visit has improved mood. Anxiety recently worsened and using xanax without evidence of addiction and/or tolerance. Has bilateral red facial papules and telangiectasias. No known triggers.  Past Medical History  Diagnosis Date  . Allergic rhinitis   . Tremor   . Edema of lower extremity   . Neuropathy   . Dyspnea   . Hypothyroidism   . Anxiety   . Depression   . History of migraine headaches   . Breast cancer   . Nonischemic cardiomyopathy     echocardiogram done 6/12 demonstrated reduced LV function with an EF of 45-50% and grade 1 diastolic dysfunction  . CAD (coronary artery disease)     Nonobstructive by cath 7/12:  50% proximal LAD  . Congenital anomaly of superior vena cava     R Ht cath 7/12:  NL pressures, no pulmonary hypertension and normal wedge pressure; probable congenital anomaly with at least a left sided SVC going into the coronary sinus.  Right-sided sats were somewhat elevated, although they seem to be elevated uniformly from the PA, RA and RV.  consider bubble study from left arm or cardiac MRI or CT   Past Surgical History  Procedure Date  . Mastectomy, partial 1994  . Bone marrow transplant 01/95  . Cervical conization w/bx   . Arthoscopy     rt. knee  . Axillary node dissection   . Bone marrow harvest   . Port-a-cath removed   . Hickman placed   . Bone marrow transplant   . Biopsy breast     L & R  . Combined hysteroscopy diagnostic / d&c   . Colonoscopy   . Cardiac catheterization     reports that she has never smoked. She has never used smokeless tobacco. She reports that  she does not drink alcohol or use illicit drugs. family history includes Allergies in her father and sister; Breast cancer in her maternal grandmother; COPD in her mother; Deep vein thrombosis in her sister; Heart attack in her father; Heart disease in her father; Stroke in her father and maternal grandmother; and Tuberculosis in her maternal grandmother. Allergies  Allergen Reactions  . Codeine   . Cymbalta (Duloxetine Hcl)     Personality changes  . Lamictal (Lamotrigine)   . Morphine And Related   . Sertraline Hcl       Review of Systems see hpi     Objective:   Physical Exam  Nursing note and vitals reviewed. Constitutional: She appears well-developed and well-nourished.  HENT:  Head: Normocephalic and atraumatic.  Neurological: She is alert.  Psychiatric: She has a normal mood and affect.          Assessment & Plan:

## 2011-11-08 NOTE — Telephone Encounter (Signed)
Left message for pt that Dr Antoine Poche has not reviewed echo as of yet however once he does I will call her back with results

## 2011-11-08 NOTE — Telephone Encounter (Signed)
Please return call to patient at [phone] regarding results of 5/9 echo.

## 2011-11-09 ENCOUNTER — Telehealth: Payer: Self-pay | Admitting: *Deleted

## 2011-11-09 MED ORDER — METRONIDAZOLE 0.75 % EX GEL: Freq: Two times a day (BID) | CUTANEOUS | Status: DC

## 2011-11-09 NOTE — Telephone Encounter (Signed)
Patient called and left voice message stating the Rx she received for Metrogel cost $95. She stated the pharmacist informed her if she has the Rx rewritten for .75% the she would be able to get a generic.   Per Dr Rodena Medin okay, Rx for Metrogel changed to .75 %  Call placed to [phone], no answer. A voice message was left informing patient Rx sent to pharmacy.

## 2011-11-10 ENCOUNTER — Telehealth: Payer: Self-pay | Admitting: Cardiology

## 2011-11-10 NOTE — Telephone Encounter (Signed)
Patient request return call regarding 5/9 test, she can be reached at [phone] home or [phone] Cell.

## 2011-11-11 NOTE — Telephone Encounter (Signed)
Per Dr Antoine Poche - he will call pt to discuss results.

## 2011-11-12 ENCOUNTER — Telehealth: Payer: Self-pay | Admitting: Cardiology

## 2011-11-12 NOTE — Telephone Encounter (Signed)
Patient called, stated she wanted results of echo done 11/04/11.Patient stated she was concerned.States tech did 3 bubble studies.Also she is scheduled to have neck injections next Friday 11/19/11 with Dr.Ramos,wants to make sure that is ok.Appointment scheduled with Dr.Hochrein Monday 11/15/11.

## 2011-11-12 NOTE — Telephone Encounter (Signed)
New msg Pt wants to know test results She hasnt heard anything from these since last week. She is upset.

## 2011-11-13 DIAGNOSIS — L719 Rosacea, unspecified: Secondary | ICD-10-CM | POA: Insufficient documentation

## 2011-11-13 NOTE — Assessment & Plan Note (Signed)
rf xanax. Aware of potential for addiction and/or tolerance.

## 2011-11-13 NOTE — Assessment & Plan Note (Signed)
Attempt metrogel. Followup if no improvement or worsening.

## 2011-11-15 ENCOUNTER — Ambulatory Visit (INDEPENDENT_AMBULATORY_CARE_PROVIDER_SITE_OTHER): Payer: Medicare Other | Admitting: Cardiology

## 2011-11-15 ENCOUNTER — Encounter: Payer: Self-pay | Admitting: Cardiology

## 2011-11-15 VITALS — BP 106/69 | HR 91 | Ht 65.0 in | Wt 156.1 lb

## 2011-11-15 DIAGNOSIS — E785 Hyperlipidemia, unspecified: Secondary | ICD-10-CM

## 2011-11-15 DIAGNOSIS — Q249 Congenital malformation of heart, unspecified: Secondary | ICD-10-CM

## 2011-11-15 DIAGNOSIS — I2510 Atherosclerotic heart disease of native coronary artery without angina pectoris: Secondary | ICD-10-CM

## 2011-11-15 DIAGNOSIS — R0789 Other chest pain: Secondary | ICD-10-CM

## 2011-11-15 NOTE — Assessment & Plan Note (Signed)
Her chest discomfort does not appear to have a cardiac etiology. No further evaluation is indicated.

## 2011-11-15 NOTE — Assessment & Plan Note (Signed)
We discussed risk reduction. I will probably perform an exercise treadmill test for followup next year.

## 2011-11-15 NOTE — Assessment & Plan Note (Addendum)
We had a long discussion (greater than 30 minutes with more than 50% time face to face).  At this point this is noncontributing the symptoms. There is no evidence of an ASD. I will follow with an echo in one year.

## 2011-11-15 NOTE — Assessment & Plan Note (Signed)
We discussed her lipid profile with an LDL of 143. She wants to try diet and exercise before adding medications.

## 2011-11-15 NOTE — Patient Instructions (Signed)
The current medical regimen is effective;  continue present plan and medications.  Follow up in 1 year with Dr Hochrein.  You will receive a letter in the mail 2 months before you are due.  Please call us when you receive this letter to schedule your follow up appointment.  

## 2011-11-15 NOTE — Progress Notes (Signed)
HPI The patient presents for her initial relation with me. She has a history of a nonischemic cardiomyopathy. This is thought to be related to chemotherapy she had in the past for breast cancer. She has had a heart catheterization last year. This demonstrated 50% LAD stenosis. Her EF was about 40-45% with some cavity dilatation. 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 After the last appt I ordered a by echocardiogram. This did seem to confirm a left-sided persistent SVC draining into the coronary sinus. However, there did not appear to be an ASD associated with this. She has normal right heart function and pressures.   She continued  chronic dyspnea and weakness.  She does get chest discomfort with activity. This is somewhat sharp. It has been relatively unchanged pattern over time. She does not have any resting complaints. She doesn't describe palpitations, presyncope or syncope. She does have some mild ankle and feet swelling.   Allergies  Allergen Reactions  . Codeine   . Cymbalta (Duloxetine Hcl)     Personality changes  . Lamictal (Lamotrigine)   . Morphine And Related   . Sertraline Hcl     Current Outpatient Prescriptions  Medication Sig Dispense Refill  . ALPRAZolam (NIRAVAM) 0.25 MG dissolvable tablet Take 1 tablet (0.25 mg total) by mouth 3 (three) times daily as needed.  90 tablet  3  . Ascorbic Acid (VITAMIN C PO) Take by mouth daily.        Marland Kitchen b complex vitamins tablet Take 1 tablet by mouth daily.        Marland Kitchen buPROPion (WELLBUTRIN XL) 300 MG 24 hr tablet Take 1 tablet (300 mg total) by mouth daily.  30 tablet  6  . calcium carbonate (OS-CAL) 600 MG TABS Take 600 mg by mouth 3 (three) times daily with meals.        . furosemide (LASIX) 40 MG tablet Take 0.5 tablets (20 mg total) by mouth daily. As needed  30 tablet  3  . levothyroxine (SYNTHROID, LEVOTHROID) 75 MCG tablet Take 1 tablet (75 mcg total) by mouth daily.  90 tablet  1  .  magnesium oxide (MAG-OX) 400 MG tablet Take 400 mg by mouth daily.        . methocarbamol (ROBAXIN) 500 MG tablet Take 500 mg by mouth as needed.      . metroNIDAZOLE (METROGEL) 0.75 % gel Apply topically 2 (two) times daily.  45 g  0  . Multiple Vitamin (MULTIVITAMIN) tablet Take 1 tablet by mouth daily.        Marland Kitchen oxyCODONE-acetaminophen (PERCOCET) 10-325 MG per tablet Take 1 tablet by mouth every 4 (four) hours as needed.      . traZODone (DESYREL) 50 MG tablet Take 1 tablet (50 mg total) by mouth at bedtime.  90 tablet  4  . vitamin E 400 UNIT capsule Take 400 Units by mouth daily.          Past Medical History  Diagnosis Date  . Allergic rhinitis   . Tremor   . Edema of lower extremity   . Neuropathy   . Dyspnea   . Hypothyroidism   . Anxiety   . Depression   . History of migraine headaches   . Breast cancer   . Nonischemic cardiomyopathy     echocardiogram done 6/12 demonstrated reduced LV function with an EF of 45-50% and grade 1 diastolic dysfunction  . CAD (coronary artery disease)  Nonobstructive by cath 7/12:  50% proximal LAD  . Congenital anomaly of superior vena cava     R Ht cath 7/12:  NL pressures, no pulmonary hypertension and normal wedge pressure; probable congenital anomaly with at least a left sided SVC going into the coronary sinus.  Right-sided sats were somewhat elevated, although they seem to be elevated uniformly from the PA, RA and RV.  consider bubble study from left arm or cardiac MRI or CT    Past Surgical History  Procedure Date  . Mastectomy, partial 1994  . Bone marrow transplant 01/95  . Cervical conization w/bx   . Arthoscopy     rt. knee  . Axillary node dissection   . Bone marrow harvest   . Port-a-cath removed   . Hickman placed   . Bone marrow transplant   . Biopsy breast     L & R  . Combined hysteroscopy diagnostic / d&c   . Colonoscopy   . Cardiac catheterization     ROS:  As stated in the HPI and negative for all other  systems.  PHYSICAL EXAM BP 106/69  Pulse 91  Ht 5\' 5"  (1.651 m)  Wt 156 lb 1.9 oz (70.816 kg)  BMI 25.98 kg/m2 GENERAL:  Well appearing HEENT:  Pupils equal round and reactive, fundi not visualized, oral mucosa unremarkable NECK:  No jugular venous distention, waveform within normal limits, carotid upstroke brisk and symmetric, no bruits, no thyromegaly LYMPHATICS:  No cervical, inguinal adenopathy LUNGS:  Clear to auscultation bilaterally BACK:  No CVA tenderness CHEST:  Unremarkable HEART:  PMI not displaced or sustained,S1 and S2 within normal limits, no S3, no S4, no clicks, no rubs, no murmurs ABD:  Flat, positive bowel sounds normal in frequency in pitch, no bruits, no rebound, no guarding, no midline pulsatile mass, no hepatomegaly, no splenomegaly EXT:  2 plus pulses throughout, no edema, no cyanosis no clubbing   ASSESSMENT AND PLAN

## 2011-11-17 ENCOUNTER — Other Ambulatory Visit (HOSPITAL_COMMUNITY): Payer: Self-pay | Admitting: Oncology

## 2011-11-17 DIAGNOSIS — Z1231 Encounter for screening mammogram for malignant neoplasm of breast: Secondary | ICD-10-CM

## 2011-11-25 NOTE — Progress Notes (Signed)
NCS/EMG was normal.  No PN or right sided LS radic.

## 2011-11-29 ENCOUNTER — Telehealth: Payer: Self-pay | Admitting: Cardiology

## 2011-12-01 ENCOUNTER — Ambulatory Visit: Payer: 59 | Admitting: Psychology

## 2011-12-08 ENCOUNTER — Encounter: Payer: Self-pay | Admitting: Internal Medicine

## 2011-12-08 ENCOUNTER — Ambulatory Visit (INDEPENDENT_AMBULATORY_CARE_PROVIDER_SITE_OTHER): Payer: 59 | Admitting: Internal Medicine

## 2011-12-08 VITALS — BP 110/80 | HR 81 | Temp 98.1°F | Resp 18 | Ht 65.5 in | Wt 151.0 lb

## 2011-12-08 DIAGNOSIS — N390 Urinary tract infection, site not specified: Secondary | ICD-10-CM

## 2011-12-08 MED ORDER — CIPROFLOXACIN HCL 500 MG PO TABS: 500.0000 mg | ORAL_TABLET | Freq: Two times a day (BID) | ORAL | Status: DC

## 2011-12-09 ENCOUNTER — Ambulatory Visit (INDEPENDENT_AMBULATORY_CARE_PROVIDER_SITE_OTHER): Payer: Medicare Other | Admitting: Neurology

## 2011-12-09 ENCOUNTER — Encounter: Payer: Self-pay | Admitting: Neurology

## 2011-12-09 VITALS — BP 112/72 | HR 76 | Wt 153.0 lb

## 2011-12-09 DIAGNOSIS — R413 Other amnesia: Secondary | ICD-10-CM
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
## 2011-12-12 NOTE — Assessment & Plan Note (Signed)
UA unrevealing however sx's suggestive of uti. Begin abx tx. Followup if no improvement or worsening.

## 2011-12-12 NOTE — Progress Notes (Signed)
  Subjective:    Patient ID: [name], female    DOB: 22-Sep-1955, 56 y.o.   MRN: [id number]  HPI Pt presents to clinic for evaluation of possible uti. Notes several day h/o pain with urination, urinary frequency and urgency. No fever, chills, n/v, back pain or hematuria. No alleviating or exacerbating factors.   Past Medical History  Diagnosis Date  . Allergic rhinitis   . Tremor   . Edema of lower extremity   . Neuropathy   . Dyspnea   . Hypothyroidism   . Anxiety   . Depression   . History of migraine headaches   . Breast cancer   . Nonischemic cardiomyopathy     echocardiogram done 6/12 demonstrated reduced LV function with an EF of 45-50% and grade 1 diastolic dysfunction  . CAD (coronary artery disease)     Nonobstructive by cath 7/12:  50% proximal LAD  . Congenital anomaly of superior vena cava     R Ht cath 7/12:  NL pressures, no pulmonary hypertension and normal wedge pressure; probable congenital anomaly with at least a left sided SVC going into the coronary sinus.  Right-sided sats were somewhat elevated, although they seem to be elevated uniformly from the PA, RA and RV.  consider bubble study from left arm or cardiac MRI or CT   Past Surgical History  Procedure Date  . Mastectomy, partial 1994  . Bone marrow transplant 01/95  . Cervical conization w/bx   . Arthoscopy     rt. knee  . Axillary node dissection   . Bone marrow harvest   . Port-a-cath removed   . Hickman placed   . Bone marrow transplant   . Biopsy breast     L & R  . Combined hysteroscopy diagnostic / d&c   . Colonoscopy   . Cardiac catheterization     reports that she has never smoked. She has never used smokeless tobacco. She reports that she does not drink alcohol or use illicit drugs. family history includes Allergies in her father and sister; Breast cancer in her maternal grandmother; COPD in her mother; Deep vein thrombosis in her sister; Heart attack in her father; Heart disease in her  father; Stroke in her father and maternal grandmother; and Tuberculosis in her maternal grandmother. Allergies  Allergen Reactions  . Codeine   . Cymbalta (Duloxetine Hcl)     Personality changes  . Lamictal (Lamotrigine)   . Morphine And Related   . Sertraline Hcl      Review of Systems see hpi     Objective:   Physical Exam  Nursing note and vitals reviewed. Constitutional: She appears well-developed and well-nourished. No distress.  HENT:  Head: Normocephalic and atraumatic.  Abdominal: Soft. There is no tenderness.  Neurological: She is alert.  Skin: She is not diaphoretic.  Psychiatric: She has a normal mood and affect.          Assessment & Plan:

## 2011-12-13 ENCOUNTER — Telehealth: Payer: Self-pay | Admitting: Internal Medicine

## 2011-12-13 MED ORDER — SULFAMETHOXAZOLE-TRIMETHOPRIM 800-160 MG PO TABS: 1.0000 | ORAL_TABLET | Freq: Two times a day (BID) | ORAL | Status: AC

## 2011-12-13 NOTE — Telephone Encounter (Signed)
If some improvement then another 5 days of cipro. If no improvement change to septra ds bid x 5 days.

## 2011-12-13 NOTE — Telephone Encounter (Signed)
Patient called and left voice message stating she was seen last week for a UTI. Her message stated she has completed the 5 days of Cipro yesterday, however her symptoms are not resolved.She stated that she is still having back pain, urinary frequency , urgency and pain with urination. She would like to know what Dr Rodena Medin advises she should do.

## 2011-12-13 NOTE — Telephone Encounter (Signed)
Call placed to patient at [phone], she was asked if there has been any improvement, she stated there has not been enough improvement to notice a change. She was informed a Rx for Septra would be sent to pharmacy. She was advised to call back if no relief. Patient verbalized understanding and agrees as instructed.

## 2011-12-16 ENCOUNTER — Ambulatory Visit
Admission: RE | Admit: 2011-12-16 | Discharge: 2011-12-16 | Disposition: A | Discharge status: Home / Self Care | Payer: Medicare Other | Source: Ambulatory Visit | Attending: Oncology | Admitting: Oncology

## 2011-12-16 ENCOUNTER — Other Ambulatory Visit (HOSPITAL_COMMUNITY): Payer: Self-pay | Admitting: Oncology

## 2011-12-16 DIAGNOSIS — N63 Unspecified lump in unspecified breast: Secondary | ICD-10-CM

## 2011-12-16 DIAGNOSIS — Z1231 Encounter for screening mammogram for malignant neoplasm of breast: Secondary | ICD-10-CM

## 2011-12-23 DIAGNOSIS — R413 Other amnesia: Secondary | ICD-10-CM

## 2011-12-24 ENCOUNTER — Ambulatory Visit (INDEPENDENT_AMBULATORY_CARE_PROVIDER_SITE_OTHER): Payer: 59 | Admitting: Psychology

## 2011-12-24 DIAGNOSIS — F331 Major depressive disorder, recurrent, moderate: Secondary | ICD-10-CM

## 2011-12-27 ENCOUNTER — Ambulatory Visit
Admission: RE | Admit: 2011-12-27 | Discharge: 2011-12-27 | Disposition: A | Discharge status: Home / Self Care | Payer: Medicare Other | Source: Ambulatory Visit | Attending: Oncology | Admitting: Oncology

## 2011-12-27 DIAGNOSIS — N63 Unspecified lump in unspecified breast: Secondary | ICD-10-CM

## 2012-01-07 ENCOUNTER — Ambulatory Visit: Payer: 59 | Admitting: Psychology

## 2012-01-10 ENCOUNTER — Ambulatory Visit: Payer: 59 | Admitting: Psychology

## 2012-01-20 ENCOUNTER — Ambulatory Visit: Payer: 59 | Admitting: Emergency Medicine

## 2012-01-24 DIAGNOSIS — R413 Other amnesia: Secondary | ICD-10-CM

## 2012-02-07 ENCOUNTER — Ambulatory Visit (INDEPENDENT_AMBULATORY_CARE_PROVIDER_SITE_OTHER): Payer: Medicare Other | Admitting: Internal Medicine

## 2012-02-07 ENCOUNTER — Telehealth: Payer: Self-pay | Admitting: Internal Medicine

## 2012-02-07 ENCOUNTER — Encounter: Payer: Self-pay | Admitting: Internal Medicine

## 2012-02-07 VITALS — BP 98/62 | HR 81 | Wt 150.0 lb

## 2012-02-07 DIAGNOSIS — M542 Cervicalgia: Secondary | ICD-10-CM

## 2012-02-07 DIAGNOSIS — F329 Major depressive disorder, single episode, unspecified: Secondary | ICD-10-CM

## 2012-02-07 DIAGNOSIS — F3289 Other specified depressive episodes: Secondary | ICD-10-CM

## 2012-02-07 DIAGNOSIS — E785 Hyperlipidemia, unspecified: Secondary | ICD-10-CM

## 2012-02-07 DIAGNOSIS — E039 Hypothyroidism, unspecified: Secondary | ICD-10-CM

## 2012-02-07 DIAGNOSIS — F32A Depression, unspecified: Secondary | ICD-10-CM

## 2012-02-07 NOTE — Progress Notes (Signed)
  Subjective:    Patient ID: [name], female    DOB: [birth year], 56 y.o.   MRN: [id number]  HPI Pt presents to clinic for followup of multiple medical problems. Feels over improved. Has less neck pain and feels like she is decluttering her life. Is using PT and traction for her neck currently. Started pravastatin one week ago and no current side effects. Weight stable. No other complaints.   Past Medical History  Diagnosis Date  . Allergic rhinitis   . Tremor   . Edema of lower extremity   . Neuropathy   . Dyspnea   . Hypothyroidism   . Anxiety   . Depression   . History of migraine headaches   . Breast cancer   . Nonischemic cardiomyopathy     echocardiogram done 6/12 demonstrated reduced LV function with an EF of 45-50% and grade 1 diastolic dysfunction  . CAD (coronary artery disease)     Nonobstructive by cath 7/12:  50% proximal LAD  . Congenital anomaly of superior vena cava     R Ht cath 7/12:  NL pressures, no pulmonary hypertension and normal wedge pressure; probable congenital anomaly with at least a left sided SVC going into the coronary sinus.  Right-sided sats were somewhat elevated, although they seem to be elevated uniformly from the PA, RA and RV.  consider bubble study from left arm or cardiac MRI or CT   Past Surgical History  Procedure Date  . Mastectomy, partial 1994  . Bone marrow transplant 01/95  . Cervical conization w/bx   . Arthoscopy     rt. knee  . Axillary node dissection   . Bone marrow harvest   . Port-a-cath removed   . Hickman placed   . Bone marrow transplant   . Biopsy breast     L & R  . Combined hysteroscopy diagnostic / d&c   . Colonoscopy   . Cardiac catheterization     reports that she has never smoked. She has never used smokeless tobacco. She reports that she does not drink alcohol or use illicit drugs. family history includes Allergies in her father and sister; Breast cancer in her maternal grandmother; COPD in her mother;  Deep vein thrombosis in her sister; Heart attack in her father; Heart disease in her father; Stroke in her father and maternal grandmother; and Tuberculosis in her maternal grandmother. Allergies  Allergen Reactions  . Codeine   . Cymbalta (Duloxetine Hcl)     Personality changes  . Lamictal (Lamotrigine)   . Morphine And Related   . Sertraline Hcl       Review of Systems see hpi     Objective:   Physical Exam  Physical Exam  Nursing note and vitals reviewed. Constitutional: Appears well-developed and well-nourished. No distress.  HENT:  Head: Normocephalic and atraumatic.  Right Ear: External ear normal.  Left Ear: External ear normal.  Eyes: Conjunctivae are normal. No scleral icterus.  Neck: Neck supple. Carotid bruit is not present.  Cardiovascular: Normal rate, regular rhythm and normal heart sounds.  Exam reveals no gallop and no friction rub.   No murmur heard. Pulmonary/Chest: Effort normal and breath sounds normal. No respiratory distress. He has no wheezes. no rales.  Lymphadenopathy:    He has no cervical adenopathy.  Neurological:Alert.  Skin: Skin is warm and dry. Not diaphoretic.  Psychiatric: Has a normal mood and affect.        Assessment & Plan:

## 2012-02-07 NOTE — Telephone Encounter (Signed)
Lab order for week of 9-23 chem 7 lipid lft 272/4 tsh/free t4 hypothyrodism  Lab order week of 07-31-12 tsh lipid lft chem 7

## 2012-02-07 NOTE — Assessment & Plan Note (Signed)
Obtain lipid/lft in ~6wks.

## 2012-02-07 NOTE — Assessment & Plan Note (Signed)
S/p MRI and pain management. Improved control. Continue conservative measures.

## 2012-02-07 NOTE — Telephone Encounter (Signed)
Labs ordered.

## 2012-02-07 NOTE — Assessment & Plan Note (Signed)
Improved mood. Continue wellbutrin.

## 2012-02-07 NOTE — Patient Instructions (Signed)
Please schedule fasting labs in 6 weeks- chem7-v58.69, lipid/lft-272.4 and tsh/free t4-hypothyroidism Also schedule fasting labs prior to next visit- tsh-hypothyroidism, lipid/lft-272.4 and chem7-v58.69

## 2012-02-08 ENCOUNTER — Encounter (HOSPITAL_COMMUNITY): Payer: Self-pay | Admitting: Oncology

## 2012-02-08 ENCOUNTER — Encounter (HOSPITAL_COMMUNITY): Payer: Medicare Other | Attending: Oncology | Admitting: Oncology

## 2012-02-08 VITALS — BP 116/71 | HR 97 | Temp 97.4°F | Resp 16 | Wt 152.3 lb

## 2012-02-08 DIAGNOSIS — Z9481 Bone marrow transplant status: Secondary | ICD-10-CM

## 2012-02-08 DIAGNOSIS — Z853 Personal history of malignant neoplasm of breast: Secondary | ICD-10-CM

## 2012-02-08 NOTE — Patient Instructions (Addendum)
[name]  [id number] 07/17/1955 Dr. Glenford Peers  [facility]  Discharge Instructions  RECOMMENDATIONS MADE BY THE CONSULTANT AND ANY TEST RESULTS WILL BE SENT TO YOUR REFERRING DOCTOR.   EXAM FINDINGS BY MD TODAY AND SIGNS AND SYMPTOMS TO REPORT TO CLINIC OR PRIMARY MD: Exam and discussion per PA.  You are doing well, no evidence of recurrence by exam.  Report any new lumps, bone pain or shortness of breath.  MEDICATIONS PRESCRIBED: none      SPECIAL INSTRUCTIONS/FOLLOW-UP: Return to Clinic in 1 year to see Dr. Mariel Gordon.   I acknowledge that I have been informed and understand all the instructions given to me and received a copy. I do not have any more questions at this time, but understand that I may call the Specialty Clinic at [facility] at [phone] during business hours should I have any further questions or need assistance in obtaining follow-up care.    __________________________________________  _____________  __________ Signature of Patient or Authorized Representative            Date                   Time    __________________________________________ Nurse's Signature
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
## 2012-02-11 ENCOUNTER — Ambulatory Visit (INDEPENDENT_AMBULATORY_CARE_PROVIDER_SITE_OTHER): Payer: Medicare Other | Admitting: Neurology

## 2012-02-11 ENCOUNTER — Encounter: Payer: Self-pay | Admitting: Neurology

## 2012-02-11 VITALS — BP 122/74 | HR 96 | Wt 151.0 lb

## 2012-02-11 DIAGNOSIS — R413 Other amnesia: Secondary | ICD-10-CM

## 2012-02-11 NOTE — Progress Notes (Signed)
Dear Dr. Rodena Medin,  I saw  [name] back in [facility] for her problem with memory.  As you may recall, she is a 56 y.o. year old female who originally saw me for pain on the right side of her neck, accompanied by headaches as well as diffuse leg and body pains.  This resolved with an ESI performed by Dr. Ethelene Hal for a severe right C5-C6 foraminal stenosis.  EMG/NCS was negative for PN or lumbar radiculopathy.  I also sent her to Dr. Caralyn Guile for anxiety and depression as I thought perhaps this might be playing a role in her symptoms.    At her last visit, she complained of memory problems.  Dr. Leonides Cave did an NP evaluation and felt that she had some problems with attention and executive function that could be due to a combination of "chemo brain", chronic pain and perhaps affective issues.  Interestingly MMPI indicated a tendency towards somatic complaints.  Dr. Leonides Cave thought consideration of a medication to help her attention might be warranted.  She returns today feeling about the same.  She still have the diffuse body pains, but the neck pain and headache have continued to be much better.  Memory is about the same.  She does not feel like is depressed as she has had these issues before and does not feel like they are much of an issue now.    Medical history, social history, and family history were reviewed and have not changed since the last clinic visit.  Current Outpatient Prescriptions on File Prior to Visit  Medication Sig Dispense Refill  . ALPRAZolam (NIRAVAM) 0.25 MG dissolvable tablet Take 1 tablet (0.25 mg total) by mouth 3 (three) times daily as needed.  90 tablet  3  . Ascorbic Acid (VITAMIN C PO) Take by mouth daily.        Marland Kitchen b complex vitamins tablet Take 1 tablet by mouth daily.        Marland Kitchen buPROPion (WELLBUTRIN XL) 300 MG 24 hr tablet Take 1 tablet (300 mg total) by mouth daily.  30 tablet  6  . calcium carbonate (OS-CAL) 600 MG TABS Take 600 mg by mouth 3  (three) times daily with meals.        . furosemide (LASIX) 40 MG tablet Take 0.5 tablets (20 mg total) by mouth daily. As needed  30 tablet  3  . levothyroxine (SYNTHROID, LEVOTHROID) 75 MCG tablet Take 1 tablet (75 mcg total) by mouth daily.  90 tablet  1  . magnesium oxide (MAG-OX) 400 MG tablet Take 400 mg by mouth daily.        . methocarbamol (ROBAXIN) 500 MG tablet Take 500 mg by mouth as needed.      . metroNIDAZOLE (METROGEL) 0.75 % gel Apply topically 2 (two) times daily.  45 g  0  . Multiple Vitamin (MULTIVITAMIN) tablet Take 1 tablet by mouth daily.        Marland Kitchen oxyCODONE-acetaminophen (PERCOCET) 10-325 MG per tablet Take 1 tablet by mouth every 4 (four) hours as needed.      . pravastatin (PRAVACHOL) 20 MG tablet Take 20 mg by mouth daily.      . traZODone (DESYREL) 50 MG tablet Take 1 tablet (50 mg total) by mouth at bedtime.  90 tablet  4  . vitamin E 400 UNIT capsule Take 400 Units by mouth daily.          Allergies  Allergen Reactions  . Codeine   .  Cymbalta (Duloxetine Hcl)     Personality changes  . Lamictal (Lamotrigine)   . Morphine And Related   . Sertraline Hcl       Exam: . Filed Vitals:   02/11/12 1139  BP: 122/74  Pulse: 96  Weight: 151 lb (68.493 kg)   Impression/Recommendations:  1.  Memory disorder - We spent a long time talking about her memory problems vis a vis her somatic complaints and her possible issues with depression and anxiety.  While I agree that she does not declare affective symptoms I still think at some level these may play a role in both her diffuse pain as well as memory dysfunction.  In any case, I agree that perhaps attacking her problem by perhaps trying to optimize her attention may be worthwhile.  I suggested she talk to Dr. Dellia Cloud about a referral to a psychiatrist to see if they think she has attentional problems sufficient enough to prescribe a stimulant. 2.  Neck pain and headache - cervicogenic; improved.  The patient will  follow up with yourself.  Lupita Raider Modesto Charon, MD [facility], [place]

## 2012-02-14 ENCOUNTER — Telehealth: Payer: Self-pay | Admitting: *Deleted

## 2012-02-14 NOTE — Telephone Encounter (Signed)
Pt D/C Pravastatin Fri, 08.16.13, d/t muscle pain; request alternative Rx to be sent to [facility]/SLS

## 2012-02-18 NOTE — Telephone Encounter (Signed)
Patient called again requesting an alternative Rx.

## 2012-02-20 NOTE — Telephone Encounter (Signed)
lipitor 20mg  qd

## 2012-02-21 MED ORDER — ATORVASTATIN CALCIUM 20 MG PO TABS: 20.0000 mg | ORAL_TABLET | Freq: Every day | ORAL | Status: DC

## 2012-02-21 NOTE — Telephone Encounter (Signed)
New Rx to pharmacy, [facility] with contact name & number for pt call back to discuss/SLS

## 2012-02-21 NOTE — Telephone Encounter (Signed)
Return call to pt on mobile [requested], patient informed/SLS

## 2012-02-25 ENCOUNTER — Other Ambulatory Visit: Payer: Self-pay | Admitting: Obstetrics & Gynecology

## 2012-02-29 ENCOUNTER — Other Ambulatory Visit: Payer: Self-pay | Admitting: Obstetrics & Gynecology

## 2012-02-29 DIAGNOSIS — M899 Disorder of bone, unspecified: Secondary | ICD-10-CM

## 2012-02-29 DIAGNOSIS — M949 Disorder of cartilage, unspecified: Secondary | ICD-10-CM

## 2012-03-03 ENCOUNTER — Ambulatory Visit (INDEPENDENT_AMBULATORY_CARE_PROVIDER_SITE_OTHER): Payer: Medicare Other | Admitting: Family

## 2012-03-03 ENCOUNTER — Encounter: Payer: Self-pay | Admitting: Family

## 2012-03-03 VITALS — BP 102/70 | HR 94 | Temp 98.7°F | Resp 16 | Wt 150.0 lb

## 2012-03-03 DIAGNOSIS — J0100 Acute maxillary sinusitis, unspecified: Secondary | ICD-10-CM

## 2012-03-03 MED ORDER — AMOXICILLIN-POT CLAVULANATE 875-125 MG PO TABS: 1.0000 | ORAL_TABLET | Freq: Two times a day (BID) | ORAL | Status: DC

## 2012-03-03 NOTE — Progress Notes (Signed)
Subjective:    Patient ID: [name], female    DOB: [birth year], 56 y.o.   MRN: [id number]  HPI  Ms.  [name] is a 56 yr old female who presents today with chief complaint of cough.  She reports associated head congestion with green nasal discharge and left facial pain  and some tightness in her chest with breathing.  Denies associated fever, but has been run down caring for a sick friend.  She has not been using any medication for her sinuses.  Feels that her symptoms are worsening.   Review of Systems See HPI  Past Medical History  Diagnosis Date  . Allergic rhinitis   . Tremor   . Edema of lower extremity   . Neuropathy   . Dyspnea   . Hypothyroidism   . Anxiety   . Depression   . History of migraine headaches   . Breast cancer   . Nonischemic cardiomyopathy     echocardiogram done 6/12 demonstrated reduced LV function with an EF of 45-50% and grade 1 diastolic dysfunction  . CAD (coronary artery disease)     Nonobstructive by cath 7/12:  50% proximal LAD  . Congenital anomaly of superior vena cava     R Ht cath 7/12:  NL pressures, no pulmonary hypertension and normal wedge pressure; probable congenital anomaly with at least a left sided SVC going into the coronary sinus.  Right-sided sats were somewhat elevated, although they seem to be elevated uniformly from the PA, RA and RV.  consider bubble study from left arm or cardiac MRI or CT  . CHF (congestive heart failure)     History   Social History  . Marital Status: Married    Spouse Name: N/A    Number of Children: N/A  . Years of Education: N/A   Occupational History  . homemaker    Social History Main Topics  . Smoking status: Never Smoker   . Smokeless tobacco: Never Used  . Alcohol Use: No  . Drug Use: No  . Sexually Active: Not on file   Other Topics Concern  . Not on file   Social History Narrative  . No narrative on file    Past Surgical History  Procedure Date  . Mastectomy, partial 1994    . Bone marrow transplant 01/95  . Cervical conization w/bx   . Arthoscopy     rt. knee  . Axillary node dissection   . Bone marrow harvest   . Port-a-cath removed   . Hickman placed   . Bone marrow transplant   . Biopsy breast     L & R  . Combined hysteroscopy diagnostic / d&c   . Colonoscopy   . Cardiac catheterization     Family History  Problem Relation Age of Onset  . COPD Mother   . Breast cancer Maternal Grandmother   . Tuberculosis Maternal Grandmother   . Stroke Maternal Grandmother   . Allergies Father   . Heart disease Father   . Stroke Father   . Heart attack Father   . Allergies Sister   . Deep vein thrombosis Sister     Allergies  Allergen Reactions  . Codeine   . Cymbalta (Duloxetine Hcl)     Personality changes  . Lamictal (Lamotrigine)   . Morphine And Related   . Sertraline Hcl     Current Outpatient Prescriptions on File Prior to Visit  Medication Sig Dispense Refill  . ALPRAZolam (NIRAVAM) 0.25 MG dissolvable  tablet Take 1 tablet (0.25 mg total) by mouth 3 (three) times daily as needed.  90 tablet  3  . Ascorbic Acid (VITAMIN C PO) Take by mouth daily.        Marland Kitchen atorvastatin (LIPITOR) 20 MG tablet Take 1 tablet (20 mg total) by mouth daily.  30 tablet  3  . b complex vitamins tablet Take 1 tablet by mouth daily.        Marland Kitchen buPROPion (WELLBUTRIN XL) 300 MG 24 hr tablet Take 1 tablet (300 mg total) by mouth daily.  30 tablet  6  . calcium carbonate (OS-CAL) 600 MG TABS Take 600 mg by mouth 3 (three) times daily with meals.        . furosemide (LASIX) 40 MG tablet Take 0.5 tablets (20 mg total) by mouth daily. As needed  30 tablet  3  . levothyroxine (SYNTHROID, LEVOTHROID) 75 MCG tablet Take 1 tablet (75 mcg total) by mouth daily.  90 tablet  1  . magnesium oxide (MAG-OX) 400 MG tablet Take 400 mg by mouth daily.        . methocarbamol (ROBAXIN) 500 MG tablet Take 500 mg by mouth as needed.      . metroNIDAZOLE (METROGEL) 0.75 % gel Apply topically  2 (two) times daily.  45 g  0  . Multiple Vitamin (MULTIVITAMIN) tablet Take 1 tablet by mouth daily.        . traZODone (DESYREL) 50 MG tablet Take 1 tablet (50 mg total) by mouth at bedtime.  90 tablet  4  . vitamin E 400 UNIT capsule Take 400 Units by mouth daily.        Marland Kitchen oxyCODONE-acetaminophen (PERCOCET) 10-325 MG per tablet Take 1 tablet by mouth every 4 (four) hours as needed.        BP 102/70  Pulse 94  Temp 98.7 F (37.1 C) (Oral)  Resp 16  Wt 150 lb 0.6 oz (68.058 kg)  SpO2 97%       Objective:   Physical Exam  Constitutional: She appears well-developed.       Tired appearing white female.   HENT:  Right Ear: Tympanic membrane and ear canal normal.  Left Ear: Tympanic membrane and ear canal normal.  Mouth/Throat: No oropharyngeal exudate or posterior oropharyngeal edema.       + frontal sinus tenderness to palpation L>R.  ?mild swelling of left cheek.    Cardiovascular: Normal rate and regular rhythm.   Pulmonary/Chest: Effort normal and breath sounds normal.  Lymphadenopathy:    She has no cervical adenopathy.          Assessment & Plan:

## 2012-03-03 NOTE — Patient Instructions (Addendum)

## 2012-03-03 NOTE — Assessment & Plan Note (Signed)
Will rx with augmentin.  Recommended Noro or tylenol prn pain.  We did discuss that if patient develops increased redness/pain swelling or problems with her vision then she is to go directly to the ED.  Patient verbalizes understanding.

## 2012-03-06 ENCOUNTER — Telehealth: Payer: Self-pay | Admitting: Family

## 2012-03-06 ENCOUNTER — Telehealth: Payer: Self-pay | Admitting: Physician Assistant

## 2012-03-06 ENCOUNTER — Telehealth: Payer: Self-pay | Admitting: *Deleted

## 2012-03-06 ENCOUNTER — Emergency Department (HOSPITAL_BASED_OUTPATIENT_CLINIC_OR_DEPARTMENT_OTHER): Payer: Medicare Other

## 2012-03-06 ENCOUNTER — Encounter (HOSPITAL_BASED_OUTPATIENT_CLINIC_OR_DEPARTMENT_OTHER): Payer: Self-pay | Admitting: *Deleted

## 2012-03-06 ENCOUNTER — Ambulatory Visit (INDEPENDENT_AMBULATORY_CARE_PROVIDER_SITE_OTHER): Payer: Medicare Other | Admitting: Family

## 2012-03-06 ENCOUNTER — Observation Stay (HOSPITAL_BASED_OUTPATIENT_CLINIC_OR_DEPARTMENT_OTHER)
Admission: EM | Admit: 2012-03-06 | Discharge: 2012-03-07 | Disposition: A | Discharge status: Home / Self Care | Payer: Medicare Other | Attending: Internal Medicine | Admitting: Internal Medicine

## 2012-03-06 ENCOUNTER — Encounter: Payer: Self-pay | Admitting: Family

## 2012-03-06 VITALS — BP 98/60 | HR 124 | Temp 99.2°F | Resp 16 | Wt 147.0 lb

## 2012-03-06 DIAGNOSIS — F3289 Other specified depressive episodes: Secondary | ICD-10-CM | POA: Insufficient documentation

## 2012-03-06 DIAGNOSIS — I428 Other cardiomyopathies: Secondary | ICD-10-CM | POA: Insufficient documentation

## 2012-03-06 DIAGNOSIS — I2510 Atherosclerotic heart disease of native coronary artery without angina pectoris: Secondary | ICD-10-CM

## 2012-03-06 DIAGNOSIS — I427 Cardiomyopathy due to drug and external agent: Secondary | ICD-10-CM | POA: Diagnosis present

## 2012-03-06 DIAGNOSIS — E039 Hypothyroidism, unspecified: Secondary | ICD-10-CM | POA: Insufficient documentation

## 2012-03-06 DIAGNOSIS — J111 Influenza due to unidentified influenza virus with other respiratory manifestations: Secondary | ICD-10-CM

## 2012-03-06 DIAGNOSIS — J029 Acute pharyngitis, unspecified: Secondary | ICD-10-CM

## 2012-03-06 DIAGNOSIS — R5383 Other fatigue: Secondary | ICD-10-CM | POA: Insufficient documentation

## 2012-03-06 DIAGNOSIS — F329 Major depressive disorder, single episode, unspecified: Secondary | ICD-10-CM | POA: Insufficient documentation

## 2012-03-06 DIAGNOSIS — F411 Generalized anxiety disorder: Secondary | ICD-10-CM

## 2012-03-06 DIAGNOSIS — R0609 Other forms of dyspnea: Secondary | ICD-10-CM | POA: Insufficient documentation

## 2012-03-06 DIAGNOSIS — E869 Volume depletion, unspecified: Secondary | ICD-10-CM

## 2012-03-06 DIAGNOSIS — Z9481 Bone marrow transplant status: Secondary | ICD-10-CM | POA: Insufficient documentation

## 2012-03-06 DIAGNOSIS — J3489 Other specified disorders of nose and nasal sinuses: Secondary | ICD-10-CM | POA: Insufficient documentation

## 2012-03-06 DIAGNOSIS — J309 Allergic rhinitis, unspecified: Secondary | ICD-10-CM

## 2012-03-06 DIAGNOSIS — R0602 Shortness of breath: Secondary | ICD-10-CM | POA: Diagnosis present

## 2012-03-06 DIAGNOSIS — J1100 Influenza due to unidentified influenza virus with unspecified type of pneumonia: Secondary | ICD-10-CM

## 2012-03-06 DIAGNOSIS — F418 Other specified anxiety disorders: Secondary | ICD-10-CM | POA: Diagnosis present

## 2012-03-06 DIAGNOSIS — Q249 Congenital malformation of heart, unspecified: Secondary | ICD-10-CM

## 2012-03-06 DIAGNOSIS — E860 Dehydration: Secondary | ICD-10-CM

## 2012-03-06 DIAGNOSIS — R5381 Other malaise: Secondary | ICD-10-CM | POA: Insufficient documentation

## 2012-03-06 DIAGNOSIS — Z853 Personal history of malignant neoplasm of breast: Secondary | ICD-10-CM | POA: Insufficient documentation

## 2012-03-06 DIAGNOSIS — R509 Fever, unspecified: Principal | ICD-10-CM | POA: Insufficient documentation

## 2012-03-06 DIAGNOSIS — R0989 Other specified symptoms and signs involving the circulatory and respiratory systems: Secondary | ICD-10-CM | POA: Insufficient documentation

## 2012-03-06 DIAGNOSIS — T451X5A Adverse effect of antineoplastic and immunosuppressive drugs, initial encounter: Secondary | ICD-10-CM | POA: Diagnosis present

## 2012-03-06 DIAGNOSIS — G4700 Insomnia, unspecified: Secondary | ICD-10-CM | POA: Diagnosis present

## 2012-03-06 HISTORY — DX: Personal history of other diseases of the digestive system: Z87.19

## 2012-03-06 HISTORY — DX: Pneumonia, unspecified organism: J18.9

## 2012-03-06 HISTORY — DX: Unspecified osteoarthritis, unspecified site: M19.90

## 2012-03-06 LAB — COMPREHENSIVE METABOLIC PANEL
ALT: 37 U/L — ABNORMAL HIGH (ref 0–35)
AST: 43 U/L — ABNORMAL HIGH (ref 0–37)
Albumin: 4 g/dL (ref 3.5–5.2)
Alkaline Phosphatase: 101 U/L (ref 39–117)
BUN: 20 mg/dL (ref 6–23)
CO2: 28 mEq/L (ref 19–32)
Calcium: 9.4 mg/dL (ref 8.4–10.5)
Chloride: 101 mEq/L (ref 96–112)
Creatinine, Ser: 0.9 mg/dL (ref 0.50–1.10)
GFR calc Af Amer: 83 mL/min — ABNORMAL LOW (ref >90)
GFR calc non Af Amer: 71 mL/min — ABNORMAL LOW (ref >90)
Glucose, Bld: 92 mg/dL (ref 70–99)
Potassium: 4.2 mEq/L (ref 3.5–5.1)
Sodium: 139 mEq/L (ref 135–145)
Total Bilirubin: 0.3 mg/dL (ref 0.3–1.2)
Total Protein: 7.1 g/dL (ref 6.0–8.3)

## 2012-03-06 LAB — CBC WITH DIFFERENTIAL/PLATELET
Basophils Absolute: 0 10*3/uL (ref 0.0–0.1)
Basophils Relative: 0 % (ref 0–1)
Eosinophils Absolute: 0 10*3/uL (ref 0.0–0.7)
Eosinophils Relative: 0 % (ref 0–5)
HCT: 43.7 % (ref 36.0–46.0)
Hemoglobin: 14.8 g/dL (ref 12.0–15.0)
Lymphocytes Relative: 23 % (ref 12–46)
Lymphs Abs: 1.4 10*3/uL (ref 0.7–4.0)
MCH: 32.4 pg (ref 26.0–34.0)
MCHC: 33.9 g/dL (ref 30.0–36.0)
MCV: 95.6 fL (ref 78.0–100.0)
Monocytes Absolute: 0.8 10*3/uL (ref 0.1–1.0)
Monocytes Relative: 12 % (ref 3–12)
Neutro Abs: 3.9 10*3/uL (ref 1.7–7.7)
Neutrophils Relative %: 64 % (ref 43–77)
Platelets: 191 10*3/uL (ref 150–400)
RBC: 4.57 MIL/uL (ref 3.87–5.11)
RDW: 12 % (ref 11.5–15.5)
WBC: 6.2 10*3/uL (ref 4.0–10.5)

## 2012-03-06 LAB — URINALYSIS, ROUTINE W REFLEX MICROSCOPIC
Glucose, UA: NEGATIVE mg/dL
Hgb urine dipstick: NEGATIVE
Ketones, ur: 40 mg/dL — AB
Leukocytes, UA: NEGATIVE
Nitrite: NEGATIVE
Protein, ur: NEGATIVE mg/dL
Specific Gravity, Urine: 1.028 (ref 1.005–1.030)
Urobilinogen, UA: 1 mg/dL (ref 0.0–1.0)
pH: 5.5 (ref 5.0–8.0)

## 2012-03-06 LAB — POCT RAPID STREP A (OFFICE): Rapid Strep A Screen: NEGATIVE

## 2012-03-06 MED ORDER — SODIUM CHLORIDE 0.9 % IV SOLN
INTRAVENOUS | Status: DC
  Administered 2012-03-06: 16:00:00 via INTRAVENOUS

## 2012-03-06 MED ORDER — SODIUM CHLORIDE 0.9 % IV BOLUS (SEPSIS)
500.0000 mL | Freq: Once | INTRAVENOUS | Status: AC
  Administered 2012-03-06: 500 mL via INTRAVENOUS

## 2012-03-06 MED ORDER — TRAZODONE HCL 50 MG PO TABS
50.0000 mg | ORAL_TABLET | Freq: Every day | ORAL | Status: DC
  Filled 2012-03-06 (×2): qty 1

## 2012-03-06 MED ORDER — TRAZODONE HCL 50 MG PO TABS
50.0000 mg | ORAL_TABLET | Freq: Every day | ORAL | Status: DC
Start: 2012-03-06 — End: 2012-03-07
  Administered 2012-03-06: 50 mg via ORAL
  Filled 2012-03-06 (×2): qty 1

## 2012-03-06 MED ORDER — LEVOTHYROXINE SODIUM 75 MCG PO TABS
75.0000 ug | ORAL_TABLET | Freq: Every day | ORAL | Status: DC
  Administered 2012-03-07: 75 ug via ORAL
  Filled 2012-03-06: qty 1

## 2012-03-06 MED ORDER — ALPRAZOLAM 0.25 MG PO TABS
0.5000 mg | ORAL_TABLET | Freq: Three times a day (TID) | ORAL | Status: DC | PRN
  Administered 2012-03-06: 0.5 mg via ORAL
  Filled 2012-03-06: qty 2

## 2012-03-06 MED ORDER — PROMETHAZINE HCL 25 MG PO TABS: 12.5000 mg | ORAL_TABLET | Freq: Four times a day (QID) | ORAL | Status: DC | PRN

## 2012-03-06 MED ORDER — SODIUM CHLORIDE 0.9 % IV BOLUS (SEPSIS)
1000.0000 mL | Freq: Once | INTRAVENOUS | Status: AC
  Administered 2012-03-06: 1000 mL via INTRAVENOUS

## 2012-03-06 MED ORDER — ALBUTEROL SULFATE (5 MG/ML) 0.5% IN NEBU
5.0000 mg | INHALATION_SOLUTION | Freq: Once | RESPIRATORY_TRACT | Status: AC
  Administered 2012-03-06: 5 mg via RESPIRATORY_TRACT
  Filled 2012-03-06: qty 1

## 2012-03-06 MED ORDER — ACETAMINOPHEN 325 MG PO TABS
650.0000 mg | ORAL_TABLET | Freq: Once | ORAL | Status: AC
  Administered 2012-03-06: 650 mg via ORAL
  Filled 2012-03-06: qty 2

## 2012-03-06 MED ORDER — BUPROPION HCL ER (XL) 300 MG PO TB24
300.0000 mg | ORAL_TABLET | Freq: Every day | ORAL | Status: DC
  Administered 2012-03-07: 300 mg via ORAL
  Filled 2012-03-06 (×2): qty 1

## 2012-03-06 MED ORDER — HYDROCODONE-ACETAMINOPHEN 10-325 MG PO TABS
1.0000 | ORAL_TABLET | Freq: Two times a day (BID) | ORAL | Status: DC | PRN
  Administered 2012-03-07: 1 via ORAL
  Filled 2012-03-06 (×2): qty 1

## 2012-03-06 MED ORDER — SODIUM CHLORIDE 0.9 % IV SOLN
INTRAVENOUS | Status: DC
  Administered 2012-03-06: 22:00:00 via INTRAVENOUS

## 2012-03-06 MED ORDER — ATORVASTATIN CALCIUM 20 MG PO TABS
20.0000 mg | ORAL_TABLET | Freq: Every day | ORAL | Status: DC
Start: 2012-03-06 — End: 2012-03-07
  Filled 2012-03-06 (×2): qty 1

## 2012-03-06 MED ORDER — BUPROPION HCL ER (XL) 300 MG PO TB24
300.0000 mg | ORAL_TABLET | Freq: Every day | ORAL | Status: DC
  Filled 2012-03-06: qty 1

## 2012-03-06 MED ORDER — ATORVASTATIN CALCIUM 20 MG PO TABS
20.0000 mg | ORAL_TABLET | Freq: Every day | ORAL | Status: DC
  Filled 2012-03-06: qty 1

## 2012-03-06 MED ORDER — AMOXICILLIN-POT CLAVULANATE 875-125 MG PO TABS
1.0000 | ORAL_TABLET | Freq: Two times a day (BID) | ORAL | Status: DC
  Administered 2012-03-06 – 2012-03-07 (×2): 1 via ORAL
  Filled 2012-03-06 (×3): qty 1

## 2012-03-06 MED ORDER — LEVOTHYROXINE SODIUM 75 MCG PO TABS
75.0000 ug | ORAL_TABLET | Freq: Every day | ORAL | Status: DC
  Administered 2012-03-07: 75 ug via ORAL
  Filled 2012-03-06 (×2): qty 1

## 2012-03-06 NOTE — Assessment & Plan Note (Signed)
Acutely ill appearing female presents today with hypotension, tachycardia, sore throat and poor po intake.  She is also wheezing with a low grade temperature.  I feel at this point she should be evaluated in ED for some stat labs, CXR and IV hydration.  I suspect she may have developed pneumonia.   Pt is agreeable to proceed to ED.   Report given to Vickie- charge nurse in ED.  Pt to be brought downstairs by wheelchair.

## 2012-03-06 NOTE — Telephone Encounter (Signed)
[facility] call with Dr. Rosalia Hammers of [facility].  [name], is a 56 y.o. female,   MRN: [id number]  -  DOB - 29-Oct-1955  Outpatient Primary MD for the patient is Letitia Libra, Ala Dach, MD   @V @  Active Problems:  * No active hospital problems. *   56 yo female with history of BRCA and bone marrow transplant which somehow caused a lower EF and diastolic dysfunction. - presents to the [facility] ED with fever of 100.8 and tachycardia  She's had URI symptoms for several days and has been on Augmentin since Friday for possible sinusitis.  She is orthostatic to pulse.  EDP would like her observed primarily for gradual hydration given her decreased EF.  I've requested observation admission to a med surge bed for influenza like illness with fever and vomiting.   Algis Downs, PA-C [facility] Pager: [phone]

## 2012-03-06 NOTE — ED Notes (Signed)
Patient states she has had sinus congestion since last Tuesday 7 days ago.  States on Fri she developed a fever and was seen by her PCP and started on augmentatin.  States she has continued to run a fever, now has productive cough with thick green yellow secretions, sore throat and generalized body aches.

## 2012-03-06 NOTE — ED Notes (Signed)
Pt. Has no lymph nodes on right side due to partial mastectomy and I.V. In left antecubital. Blood pressure taken via left wrist for orthostatic vitals.

## 2012-03-06 NOTE — Patient Instructions (Addendum)
Please go to the ED downstairs for further evaluation. Arrange follow up with Dr. Rodena Medin after you are discharged from the emergency department.

## 2012-03-06 NOTE — Progress Notes (Signed)
Subjective:    Patient ID: [name], female    DOB: 07/24/1955, 56 y.o.   MRN: [id number]  HPI  [name] is a 56 yr old female who presents today for follow up. She was seen on Friday for sinusitis and was started on Augmentin.  Since that time, she reports her sinus symptoms have improved, but she now has a severe sore throat. She reports associated anorexia and admits to probably not drinking enough fluids. She also reports associated fever.    Review of Systems See HPI  Past Medical History  Diagnosis Date  . Allergic rhinitis   . Tremor   . Edema of lower extremity   . Neuropathy   . Dyspnea   . Hypothyroidism   . Anxiety   . Depression   . History of migraine headaches   . Breast cancer   . Nonischemic cardiomyopathy     echocardiogram done 6/12 demonstrated reduced LV function with an EF of 45-50% and grade 1 diastolic dysfunction  . CAD (coronary artery disease)     Nonobstructive by cath 7/12:  50% proximal LAD  . Congenital anomaly of superior vena cava     R Ht cath 7/12:  NL pressures, no pulmonary hypertension and normal wedge pressure; probable congenital anomaly with at least a left sided SVC going into the coronary sinus.  Right-sided sats were somewhat elevated, although they seem to be elevated uniformly from the PA, RA and RV.  consider bubble study from left arm or cardiac MRI or CT  . CHF (congestive heart failure)     History   Social History  . Marital Status: Married    Spouse Name: N/A    Number of Children: N/A  . Years of Education: N/A   Occupational History  . homemaker    Social History Main Topics  . Smoking status: Never Smoker   . Smokeless tobacco: Never Used  . Alcohol Use: No  . Drug Use: No  . Sexually Active: Not on file   Other Topics Concern  . Not on file   Social History Narrative  . No narrative on file    Past Surgical History  Procedure Date  . Mastectomy, partial 1994  . Bone marrow transplant 01/95  .  Cervical conization w/bx   . Arthoscopy     rt. knee  . Axillary node dissection   . Bone marrow harvest   . Port-a-cath removed   . Hickman placed   . Bone marrow transplant   . Biopsy breast     L & R  . Combined hysteroscopy diagnostic / d&c   . Colonoscopy   . Cardiac catheterization     Family History  Problem Relation Age of Onset  . COPD Mother   . Breast cancer Maternal Grandmother   . Tuberculosis Maternal Grandmother   . Stroke Maternal Grandmother   . Allergies Father   . Heart disease Father   . Stroke Father   . Heart attack Father   . Allergies Sister   . Deep vein thrombosis Sister     Allergies  Allergen Reactions  . Codeine   . Cymbalta (Duloxetine Hcl)     Personality changes  . Lamictal (Lamotrigine)   . Morphine And Related   . Sertraline Hcl     Current Outpatient Prescriptions on File Prior to Visit  Medication Sig Dispense Refill  . ALPRAZolam (NIRAVAM) 0.25 MG dissolvable tablet Take 1 tablet (0.25 mg total) by mouth  3 (three) times daily as needed.  90 tablet  3  . amoxicillin-clavulanate (AUGMENTIN) 875-125 MG per tablet Take 1 tablet by mouth 2 (two) times daily.  20 tablet  0  . Ascorbic Acid (VITAMIN C PO) Take by mouth daily.        Marland Kitchen atorvastatin (LIPITOR) 20 MG tablet Take 1 tablet (20 mg total) by mouth daily.  30 tablet  3  . b complex vitamins tablet Take 1 tablet by mouth daily.        Marland Kitchen buPROPion (WELLBUTRIN XL) 300 MG 24 hr tablet Take 1 tablet (300 mg total) by mouth daily.  30 tablet  6  . calcium carbonate (OS-CAL) 600 MG TABS Take 600 mg by mouth 3 (three) times daily with meals.        . furosemide (LASIX) 40 MG tablet Take 0.5 tablets (20 mg total) by mouth daily. As needed  30 tablet  3  . HYDROcodone-acetaminophen (NORCO) 10-325 MG per tablet Take 1 tablet by mouth as needed.      Marland Kitchen levothyroxine (SYNTHROID, LEVOTHROID) 75 MCG tablet Take 1 tablet (75 mcg total) by mouth daily.  90 tablet  1  . magnesium oxide (MAG-OX)  400 MG tablet Take 400 mg by mouth daily.        . methocarbamol (ROBAXIN) 500 MG tablet Take 500 mg by mouth as needed.      . metroNIDAZOLE (METROGEL) 0.75 % gel Apply topically 2 (two) times daily.  45 g  0  . Multiple Vitamin (MULTIVITAMIN) tablet Take 1 tablet by mouth daily.        . traZODone (DESYREL) 50 MG tablet Take 1 tablet (50 mg total) by mouth at bedtime.  90 tablet  4  . vitamin E 400 UNIT capsule Take 400 Units by mouth daily.          BP 98/60  Pulse 124  Temp 99.2 F (37.3 C) (Oral)  Resp 16  Wt 147 lb (66.679 kg)  SpO2 99%       Objective:   Physical Exam  Constitutional: She is oriented to person, place, and time. She appears well-developed and well-nourished. No distress.       Pale, weak and ill appearing white female. NAD.  HENT:  Right Ear: Tympanic membrane and ear canal normal.  Left Ear: Tympanic membrane and ear canal normal.  Mouth/Throat: Posterior oropharyngeal erythema present. No oropharyngeal exudate, posterior oropharyngeal edema or tonsillar abscesses.  Cardiovascular: Regular rhythm.  Tachycardia present.   No murmur heard. Pulmonary/Chest: Effort normal.       R sided expiratory wheeze.  Musculoskeletal: She exhibits no edema.  Lymphadenopathy:    She has cervical adenopathy.  Neurological: She is alert and oriented to person, place, and time.  Skin: Skin is warm and dry.  Psychiatric: She has a normal mood and affect. Her behavior is normal. Judgment and thought content normal.          Assessment & Plan:

## 2012-03-06 NOTE — Telephone Encounter (Signed)
[name] called this AM to report that her sinus symptoms are improved, but she is now bringing up "green stuff" and feels like her throat is swollen.  Wonders if she may also be having some anxiety. She denies shortness of breath.  Has had fever (tmax 100.8). Advised her to come to the office to be seen this AM.  Pt will get dressed and come now.

## 2012-03-06 NOTE — H&P (Signed)
[facility] History and Physical  [name] NWG:[id number] DOB: 1956-01-15 DOA: 03/06/2012  Referring physician: Dr. Rosalia Hammers PCP: Letitia Libra, Ala Dach, MD  Oncologist =[place] Pulmonologist =[place] Cardiologist =[place]  Chief Complaint: Fever  HPI: [name] is a 56 y.o. female   Who was transferred over from that center [facility]-states that this all started after she spent a week with friedn at the [facility] friend is from [place] and was admitted to the Hospital on the 4th floor-She did not have any infection.  Wed of last week started feeling like she was gettig a cold-no sick contact but was in the hopsital most of the time and stayed overnight one-two nights.  Last Friday went into see Daryel Gerald and was placed on Augmentin.  She had a fever over the weekend and it went does.  Awoke this am and had a hard time breathing and had a temp of 100.8,  Had some dizzyness and felt like she had been wheezing and had some chest tightness and some mylagias, Blood pressures is usually 90/70 and has some spotting in her Urine and was seen by Ms Lendell Caprice and was transferred here.  Has a h/o sinusitis and was swollen in the face on Friday and took the Augmentin-has pain in her teeth in both upper and loweer teeth.  The pain is a 5-6/10.  Took tylenol @ [facility] and this seemed to help  Review of Systems: some HA, Mild blurred vision, the painn in the face is better, the Right side of face is better, +sore throat, no neck stiffness, No CP right now, +Cough started maybe yesterday-with no productive sputum, malaise and mylagias,  No stomach ache or diarrhea-some Nausea, Appetite decreaserd.  Odynophagia and not drinking much, no stroke like symptoms but has balance issues.  Has a tremor on L hand-  Occasional swelling of Lower extremities-she takes Lasix by mouth and q. every other day    Chart review   EGD/with scattered erosions and gastric antrum by Dr. Danise Edge  11/16/2000  History of hysteroscopic essure placement 04/05/2002  History of now QRS tachycardia with long RP interval seen by Dr. Sharrell Ku 04/26/2002-noted no dual AV nodal physiology accessory pathway or other issues and there were no inducible arrhythmias at that time-thought to be cardiomyopathy related from chemotherapy for breast cancer-  history of left breast biopsy 05/07/2002   colonoscopy with polypectomy 08/05/2003 = 2 small polyps in the rectum otherwise normal proctocolonoscopy of the cecum  hx breast CA s/p auto-stem cell tx '95 without recurrance [facility]). Course was c/b pulmonary toxicity - was told that her CT's and PFT's were abnormal-Office visit 02/08/2011 note for oncology followup "Stage II adenocarcinoma of right breast presenting with multiple lymph nodes but no obvious primary in the right breast. 11 positive nodes. ER positive, PR negative 2. Left breast fibrocystic/glandular nodule-Sees Dr Mariel Sleet in [facility] currently  History cardiac cath 01/01/2011 = moderate calcific -diffuse hypokinesis likely from previous chemotherapy, guarding or he and continued therapy for cardiomyopathy was thought to be in order at that time-EF at that Was 40-45% disease in the diminutive proximal LAD which was not flow-limiting  Seen by Neurologist Dr. Modesto Charon for memory problems and thought to be probably related to depression and anxiety-has cervicogenic neck and headache pain that is followed by him as well   Seen 03/03/2012 with congestion when she went to see her primary care physician-given a course of Augmentin at that time  Had Urogenic testing at [facility]  Dr. Juliene Pina at [facility] about 2 weeks ago  Dr. Sandria Manly thought she might have some Psuedo MS-not diag.   Past Medical History  Diagnosis Date  . Allergic rhinitis   . Tremor   . Edema of lower extremity   . Neuropathy   . Dyspnea   . Hypothyroidism   . Anxiety   . Depression   . History of migraine headaches   . Breast  cancer   . Nonischemic cardiomyopathy     echocardiogram done 6/12 demonstrated reduced LV function with an EF of 45-50% and grade 1 diastolic dysfunction  . CAD (coronary artery disease)     Nonobstructive by cath 7/12:  50% proximal LAD  . Congenital anomaly of superior vena cava     R Ht cath 7/12:  NL pressures, no pulmonary hypertension and normal wedge pressure; probable congenital anomaly with at least a left sided SVC going into the coronary sinus.  Right-sided sats were somewhat elevated, although they seem to be elevated uniformly from the PA, RA and RV.  consider bubble study from left arm or cardiac MRI or CT  . CHF (congestive heart failure)    Past Surgical History  Procedure Date  . Mastectomy, partial 1994  . Bone marrow transplant 01/95  . Cervical conization w/bx   . Arthoscopy     rt. knee  . Axillary node dissection   . Bone marrow harvest   . Port-a-cath removed   . Hickman placed   . Bone marrow transplant   . Biopsy breast     L & R  . Combined hysteroscopy diagnostic / d&c   . Colonoscopy   . Cardiac catheterization    Social History:  reports that she has never smoked. She has never used smokeless tobacco. She reports that she does not drink alcohol or use illicit drugs.  History   Social History  . Marital Status: Married    Spouse Name: N/A    Number of Children: N/A  . Years of Education: N/A   Occupational History  . homemaker    Social History Main Topics  . Smoking status: Former Smoker    Types: Cigarettes  . Smokeless tobacco: Never Used  . Alcohol Use: Yes  . Drug Use: No  . Sexually Active: None   Other Topics Concern  . None   Social History Narrative   L[place] in [place] been married for 4 yerars.  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 Codeine   . Cymbalta (Duloxetine Hcl)     Personality changes  . Lamictal (Lamotrigine)   . Morphine And  Related   . Sertraline Hcl     Family History  Problem Relation Age of Onset  . COPD Mother   . Breast cancer Maternal Grandmother   . Tuberculosis Maternal Grandmother   . Stroke Maternal Grandmother   . Allergies Father   . Heart disease Father   . Stroke Father   . Heart attack Father   . Allergies Sister   . Deep vein thrombosis Sister      Prior to Admission medications   Medication Sig Start Date End Date Taking? Authorizing Provider  acetaminophen (TYLENOL) 500 MG tablet Take 1,000 mg by mouth every 6 (six) hours as needed. For fever.   Yes Historical Provider, MD  ALPRAZolam (NIRAVAM) 0.25 MG dissolvable tablet Take 0.25 mg by mouth 3 (three) times daily as  needed. For anxiety 11/08/11  Yes Edwyna Perfect, MD  amoxicillin-clavulanate (AUGMENTIN) 875-125 MG per tablet Take 1 tablet by mouth 2 (two) times daily. 03/03/12 03/13/12 Yes Sandford Craze, NP  buPROPion (WELLBUTRIN XL) 300 MG 24 hr tablet Take 300 mg by mouth daily. 09/27/11  Yes Edwyna Perfect, MD  HYDROcodone-acetaminophen (NORCO) 10-325 MG per tablet Take 1 tablet by mouth as needed. For pain.   Yes Historical Provider, MD  levothyroxine (SYNTHROID, LEVOTHROID) 75 MCG tablet Take 75 mcg by mouth daily. 09/27/11  Yes Edwyna Perfect, MD  traZODone (DESYREL) 50 MG tablet Take 50 mg by mouth at bedtime. For anxiety 10/05/11  Yes Edwyna Perfect, MD  atorvastatin (LIPITOR) 20 MG tablet Take 20 mg by mouth daily. 02/21/12 02/20/13  Edwyna Perfect, MD   Physical Exam: Filed Vitals:   03/06/12 1321 03/06/12 1517 03/06/12 1604 03/06/12 1853  BP: 150/93 153/80 122/61 124/69  Pulse: 103 78  90  Temp:  98.4 F (36.9 C)  98.4 F (36.9 C)  TempSrc:  Oral  Oral  Resp:  18  18  SpO2:  96%  98%     General:  Alert but tired-appearing Caucasian female with flat affect in no apparent distress  Eyes: No pallor no icterus, funduscopy done but not able to see the back of the fundus,  ENT: Both both tympanic membranes appear to  have fluid behind them I do not appreciate any air fluid levels. Her maxillary sinus on the right is tender as is her frontal sinus and ethmoid sinuses  Neck: Soft nontender nondistended no JVD no bruit no thyromegaly noted  Cardiovascular: S1-S2 no murmur rub or gallop regular rate rhythm  Respiratory: Clinically clear no added sound no tactile vocal resonance or fremitus  Abdomen: Slightly tender in the right lower quadrant, no rebound no guarding bowel sounds heard  Skin: No Largent edema  Musculoskeletal: Good range of motion to both lower extremities and all 4 limbs with no swelling of any joints  Psychiatric: Flat affect-anxious  Neurologic: Grossly intact motor-sensory grossly intact  Labs on Admission:  Basic Metabolic Panel:  Lab 03/06/12 1610  NA 139  K 4.2  CL 101  CO2 28  GLUCOSE 92  BUN 20  CREATININE 0.90  CALCIUM 9.4  MG --  PHOS --   Liver Function Tests:  Lab 03/06/12 1130  AST 43*  ALT 37*  ALKPHOS 101  BILITOT 0.3  PROT 7.1  ALBUMIN 4.0   No results found for this basename: LIPASE:5,AMYLASE:5 in the last 168 hours No results found for this basename: AMMONIA:5 in the last 168 hours CBC:  Lab 03/06/12 1130  WBC 6.2  NEUTROABS 3.9  HGB 14.8  HCT 43.7  MCV 95.6  PLT 191   Cardiac Enzymes: No results found for this basename: CKTOTAL:5,CKMB:5,CKMBINDEX:5,TROPONINI:5 in the last 168 hours  BNP (last 3 results) No results found for this basename: PROBNP:3 in the last 8760 hours CBG: No results found for this basename: GLUCAP:5 in the last 168 hours  Radiological Exams on Admission: Dg Chest 2 View  03/06/2012  *RADIOLOGY REPORT*  Clinical Data: Cough.  Congestion.  Fever.  Mastectomy. Cardiomyopathy.  CHEST - 2 VIEW  Comparison: 11/18/2010  Findings: There is pectus deformity of the chest.  There is been previous partial mastectomy on the right with multiple clips in the axilla.  There is chronic indistinctness of the right heart border  presumed to relate to scarring and/or of the chest deformity.  No sign of acute infiltrate, mass, collapse or effusion.  IMPRESSION: Pectus deformity at chest.  Chest wall surgery on the right.  No active process evident.   Original Report Authenticated By: Thomasenia Sales, M.D.    Ct Maxillofacial Wo Cm  03/06/2012  *RADIOLOGY REPORT*  Clinical Data:  Sinus congestion and drainage.  History of breast cancer.  CT LIMITED SINUSES WITHOUT CONTRAST  Technique:  Multidetector CT images of the paranasal sinuses were obtained in a single plane without contrast.  Comparison:  11/01/2011 CT maxillofacial.  Findings:  The sphenoid sinus is clear.  The maxillary sinuses show only minimal dependent mucosal thickening.  There is slight mucosal thickening in the left ethmoid air cells.  The frontal sinuses are clear.  There is slight nasal septal deviation of 2 mm right-to-left.  The visualized intracranial compartment is unremarkable.  There is no osseous destructive or sclerotic lesion.  A similar appearance was seen on prior examination.  IMPRESSION: No evidence for acute sinusitis.  Minimal chronic mucosal thickening.   Original Report Authenticated By: Elsie Stain, M.D.     EKG: Independently reviewed. Sinus rhythm, PR interval 0.08 QRS axis 0, no ST-T wave elevations across the precordium, precordial transition occurs at the 4, normal EKG  Assessment/Plan Principal Problem:  *Possible Influenza and pneumonia-more likely simple Sinusitis  Active Problems:  ANXIETY  DYSPNEA  BREAST CANCER, HX OF  Depression  Hypothyroidism  Nonischemic cardiomyopathy  Anomalous SVC  Insomnia   1. Likely simple sinusitis given the fact that her chest x-ray is negative and a CT of the sinuses are negative. She will get still a Legionella culture, influenza virus antigen antibody, respiratory culture done given her relative immunocompromised state and history of breast cancer. We will continue her Augmentin and keep her  on IV fluids at a low rate of 50 cc per hour. We'll get a CBC in the morning as well as a repeat chest x-ray as I suspect although her initial chest x-ray was negative, she may benefit from having this done to rule out early pneumonia. I will not escalate antibiotics at present but if she does spike a fever about 101 we will then determine the best course and place her on broad-spectrum antibiotics such as vancomycin and Zosyn-she's very stable clinically and has no evidences of overt sepsis-please followup blood cultures that were done as well as urinary cultures that were done 2.  History of breast cancer as per above-patient will need followup with Dr. Mariel Sleet. She states she had experimental therapy and indeed on review of the chart she has cardiomyopathy as per above as well as Pulm toxicity from the chemotherapy that was performed. Potentially we will need to get the notes from Dr. Lesle Chris office. 3. History of cardiomyopathy with 50% LAD stenosis and EF 40-45%-careful with IV fluids. She takes when necessary Lasix at home which will be held for now. She says that her blood pressure is always low normal therefore she potentially can be discontinued off of IV fluid in the morning 03/07/2012 4. Euvolemic CHF EF 45-50% with grade 1 diastolic dysfunction per echocardiogram and diffuse hypokinesis-stable at present-reconcile meds in the morning please and when she is discharged ensure that she has followup. Get daily weights 5. History of anomalous SVC-this is followed by cardiology and there is no evidence of ASD. She is supposed to get an echocardiogram in one year per 5/20201note by Dr. Antoine Poche 6. History of hypothyroidism continue Synthroid 75 mcg per day 7. Vague  abdominal pain-she has some mild tenderness to the right lower quadrant without diarrhea. I have informed her that the pain increases, we may need to work this up-it is very unlikely that she has an appendicitis or colitis with a normal  white count and minimal fever however and I have held off on ordering this at this time 8. Minimally elevated LFTs-follows as an outpatient 9. History of significant bipolar-patient on the Bupropion 300 XL, alprazolam 0.25 mg 3 times a day which had increased to 0.5 mg 3 times a day, trazodone 50 mg each bedtime which will all be continued-please do not give her Ambien as this makes her hallucinate and sleep walk  Code Status: Full Family Communication: Spoke with husband in room Disposition Plan: MedSurg OBS   Time spent: One hour  Rhetta Mura [facility] Pager [phone]  If 7PM-7AM, please contact night-coverage [email or web address] Password [email or web address] 03/06/2012, 7:19 PM

## 2012-03-06 NOTE — ED Notes (Signed)
[facility] has been notified of room 

## 2012-03-06 NOTE — ED Provider Notes (Signed)
History     CSN: [id number]  Arrival date & time 03/06/12  0937 In until is a  [name] is in andt MD Initiated Contact with Patient 03/06/12 1047      Chief Complaint  Patient presents with  . Fever    (Consider location/radiation/quality/duration/timing/severity/associated sxs/prior treatment) HPI Patient began having fatigue, uri, nasal congestion began last week Wednesday or Tuesday.  Seen by Sandford Craze , NP last Friday and augmentin started for sinusitis.  Patient continued with fever and productive cough with andyellow secretions and temp today 100.8.  No vomiting, taking augmentin today because throat feels swollen-now sore an and and swollen but not previously  Patient with history of breast cancer and had bone marrow tx in 1995.   Past Medical History  Diagnosis Date  . Allergic rhinitis   . Tremor   . Edema of lower extremity   . Neuropathy   . Dyspnea   . Hypothyroidism   . Anxiety   . Depression   . History of migraine headaches   . Breast cancer   . Nonischemic cardiomyopathy     echocardiogram done 6/12 demonstrated reduced LV function with an EF of 45-50% and grade 1 diastolic dysfunction  . CAD (coronary artery disease)     Nonobstructive by cath 7/12:  50% proximal LAD  . Congenital anomaly of superior vena cava     R Ht cath 7/12:  NL pressures, no pulmonary hypertension and normal wedge pressure; probable congenital anomaly with at least a left sided SVC going into the coronary sinus.  Right-sided sats were somewhat elevated, although they seem to be elevated uniformly from the PA, RA and RV.  consider bubble study from left arm or cardiac MRI or CT  . CHF (congestive heart failure)     Past Surgical History  Procedure Date  . Mastectomy, partial 1994  . Bone marrow transplant 01/95  . Cervical conization w/bx   . Arthoscopy     rt. knee  . Axillary node dissection   . Bone marrow harvest   . Port-a-cath removed   . Hickman placed   . Bone marrow  transplant   . Biopsy breast     L & R  . Combined hysteroscopy diagnostic / d&c   . Colonoscopy   . Cardiac catheterization     Family History  Problem Relation Age of Onset  . COPD Mother   . Breast cancer Maternal Grandmother   . Tuberculosis Maternal Grandmother   . Stroke Maternal Grandmother   . Allergies Father   . Heart disease Father   . Stroke Father   . Heart attack Father   . Allergies Sister   . Deep vein thrombosis Sister     History  Substance Use Topics  . Smoking status: Never Smoker   . Smokeless tobacco: Never Used  . Alcohol Use: No    OB History    Grav Para Term Preterm Abortions TAB SAB Ect Mult Living                  Review of Systems  Constitutional: Negative for fever, chills, activity change, appetite change and unexpected weight change.  HENT: Negative for sore throat, rhinorrhea, neck pain, neck stiffness and sinus pressure.   Eyes: Negative for visual disturbance.  Respiratory: Negative for cough and shortness of breath.   Cardiovascular: Negative for chest pain and leg swelling.  Gastrointestinal: Negative for vomiting, abdominal pain, diarrhea and blood in stool.  Genitourinary: Negative for  dysuria, urgency, frequency, vaginal discharge and difficulty urinating.  Musculoskeletal: Negative for myalgias, arthralgias and gait problem.  Skin: Negative for color change and rash.  Neurological: Negative for weakness, light-headedness and headaches.  Hematological: Does not bruise/bleed easily.  Psychiatric/Behavioral: Negative for dysphoric mood.    Allergies  Codeine; Cymbalta; Lamictal; Morphine and related; and Sertraline hcl  Home Medications   Current Outpatient Rx  Name Route Sig Dispense Refill  . ALPRAZOLAM 0.25 MG PO TBDP Oral Take 1 tablet (0.25 mg total) by mouth 3 (three) times daily as needed. 90 tablet 3  . AMOXICILLIN-POT CLAVULANATE 875-125 MG PO TABS Oral Take 1 tablet by mouth 2 (two) times daily. 20 tablet 0  .  VITAMIN C PO Oral Take by mouth daily.      . ATORVASTATIN CALCIUM 20 MG PO TABS Oral Take 1 tablet (20 mg total) by mouth daily. 30 tablet 3  . B COMPLEX PO TABS Oral Take 1 tablet by mouth daily.      . BUPROPION HCL ER (XL) 300 MG PO TB24 Oral Take 1 tablet (300 mg total) by mouth daily. 30 tablet 6  . CALCIUM CARBONATE 600 MG PO TABS Oral Take 600 mg by mouth 3 (three) times daily with meals.      . FUROSEMIDE 40 MG PO TABS Oral Take 0.5 tablets (20 mg total) by mouth daily. As needed 30 tablet 3  . HYDROCODONE-ACETAMINOPHEN 10-325 MG PO TABS Oral Take 1 tablet by mouth as needed.    Marland Kitchen LEVOTHYROXINE SODIUM 75 MCG PO TABS Oral Take 1 tablet (75 mcg total) by mouth daily. 90 tablet 1  . MAGNESIUM OXIDE 400 MG PO TABS Oral Take 400 mg by mouth daily.      Marland Kitchen METHOCARBAMOL 500 MG PO TABS Oral Take 500 mg by mouth as needed.    Marland Kitchen METRONIDAZOLE 0.75 % EX GEL Topical Apply topically 2 (two) times daily. 45 g 0  . ONE-DAILY MULTI VITAMINS PO TABS Oral Take 1 tablet by mouth daily.      . TRAZODONE HCL 50 MG PO TABS Oral Take 1 tablet (50 mg total) by mouth at bedtime. 90 tablet 4  . VITAMIN E 400 UNITS PO CAPS Oral Take 400 Units by mouth daily.        BP 120/69  Pulse 100  Temp 99.1 F (37.3 C) (Oral)  Resp 16  SpO2 99%  Physical Exam  Nursing note and vitals reviewed. Constitutional: She appears well-developed and well-nourished.  HENT:  Head: Normocephalic and atraumatic.  Eyes: Conjunctivae and EOM are normal. Pupils are equal, round, and reactive to light.  Neck: Normal range of motion. Neck supple.  Cardiovascular: Regular rhythm, normal heart sounds and intact distal pulses.  Tachycardia present.   Pulmonary/Chest: Effort normal. She has wheezes.  Abdominal: Soft. Bowel sounds are normal.  Musculoskeletal: Normal range of motion.  Neurological: She is alert.  Skin: Skin is warm and dry.  Psychiatric: She has a normal mood and affect. Thought content normal.    ED Course    Procedures (including critical care time)  Labs Reviewed - No data to display No results found.   No diagnosis found.   Date: 03/06/2012  Rate: 80  Rhythm: normal sinus rhythm  QRS Axis: normal  Intervals: normal  ST/T Wave abnormalities: normal  Conduction Disutrbances: none  Narrative Interpretation: unremarkable      MDM  Patient given 1500 cc ns and continues orthostatic.   Patient with some decreased left ventricular  dysfunction with last ef 45%.  Patient with low uop here but plan gentle rehydration to avoid fluid overload. Patient has been being treated with Augmentin. She continues to have nasal congestion, sore throat, and cough. She does not have any focal infection noted on workup. Symptoms most consistent with influenza-like illness. Blood cultures were obtained. Would hold antibiotics at this time    Plan transfer to hospitalists for iv fluids and observation.   Hilario Quarry, MD 03/06/12 [phone]

## 2012-03-06 NOTE — Telephone Encounter (Signed)
Pt has scheduled follow up in the office today.   [facility] Triage Call Report Triage Record Num: [id number] Operator: April Finney Patient Name: [name] Call Date & Time: 03/04/2012 7:20:31PM Patient Phone: [phone] PCP: Marguarite Arbour Patient Gender: Female PCP Fax : [phone] Patient DOB: 1955/11/10 Practice Name: [facility] [place] Reason for Call: Caller: Amil Amen; PCP: Marguarite Arbour (Adults only); CB#: [phone]; Call regarding Was diagnosis with severe sinus infection, running fever of 100.8. ; Started on Augmentin 03/03/12. Temp 100.8 oral. Headache and neck pain. Tylenol taken 2.5 hrs ago and no relief. She feels much weaker today and has been unable to sleep. She has CHF and CAD. Husband states the doctor said if she became worse this weekend to take her to the ED due to her having a weak heart and infection could spread to her heart. See in ED care advice given. Protocol(s) Used: Upper Respiratory Infection (URI) Recommended Outcome per Protocol: See Provider within 24 hours Reason for Outcome: Temperature of 101.5 F (38.6 C) or greater that has not responded to 24 hours of home care measures Care Advice: ~ Go to the ED if has new onset of stiff neck, vomiting, drowsiness or confusion. ~ SYMPTOM / CONDITION MANAGEMENT ~ CAUTIONS ~ List, or take, all current prescription(s), nonprescription or alternative medication(s) to provider for evaluation. Most adults need to drink 6-10 eight-ounce glasses (1.2-2.0 liters) of fluids per day unless previously told to limit fluid intake for other medical reasons. Limit fluids that contain caffeine, sugar or alcohol. Urine will be a very light yellow color when you drink enough fluids. ~ Analgesic/Antipyretic Advice - Acetaminophen: Consider acetaminophen as directed on label or by pharmacist/provider for pain or fever. PRECAUTIONS: - Use only if there is no history of liver disease, alcoholism, or intake of three  or more alcohol drinks per day. - If approved by provider when breastfeeding. - Do not exceed recommended dose or frequency.

## 2012-03-06 NOTE — ED Notes (Signed)
Sandford Craze sts pt had negative strep test.

## 2012-03-06 NOTE — ED Notes (Signed)
Attempted to call report

## 2012-03-07 ENCOUNTER — Observation Stay (HOSPITAL_COMMUNITY): Payer: Medicare Other

## 2012-03-07 ENCOUNTER — Encounter (HOSPITAL_COMMUNITY): Payer: Self-pay | Admitting: *Deleted

## 2012-03-07 DIAGNOSIS — F411 Generalized anxiety disorder: Secondary | ICD-10-CM

## 2012-03-07 DIAGNOSIS — J309 Allergic rhinitis, unspecified: Secondary | ICD-10-CM

## 2012-03-07 DIAGNOSIS — I2510 Atherosclerotic heart disease of native coronary artery without angina pectoris: Secondary | ICD-10-CM

## 2012-03-07 LAB — CBC
HCT: 38.6 % (ref 36.0–46.0)
Hemoglobin: 13 g/dL (ref 12.0–15.0)
MCH: 32.3 pg (ref 26.0–34.0)
MCHC: 33.7 g/dL (ref 30.0–36.0)
MCV: 96 fL (ref 78.0–100.0)
Platelets: 168 10*3/uL (ref 150–400)
RBC: 4.02 MIL/uL (ref 3.87–5.11)
RDW: 12.3 % (ref 11.5–15.5)
WBC: 5 10*3/uL (ref 4.0–10.5)

## 2012-03-07 LAB — INFLUENZA PANEL BY PCR (TYPE A & B)
H1N1 flu by pcr: NOT DETECTED
Influenza A By PCR: NEGATIVE
Influenza B By PCR: NEGATIVE

## 2012-03-07 MED ORDER — GUAIFENESIN-DM 100-10 MG/5ML PO SYRP
5.0000 mL | ORAL_SOLUTION | ORAL | Status: DC | PRN
  Filled 2012-03-07: qty 5

## 2012-03-07 MED ORDER — GUAIFENESIN-DM 100-10 MG/5ML PO SYRP: 5.0000 mL | ORAL_SOLUTION | ORAL | Status: AC | PRN

## 2012-03-07 MED ORDER — HYDROCODONE-ACETAMINOPHEN 10-325 MG PO TABS: 2.0000 | ORAL_TABLET | ORAL | Status: AC | PRN

## 2012-03-07 MED ORDER — ACETAMINOPHEN 325 MG PO TABS
650.0000 mg | ORAL_TABLET | Freq: Four times a day (QID) | ORAL | Status: DC | PRN
  Administered 2012-03-07 (×2): 650 mg via ORAL
  Filled 2012-03-07 (×2): qty 2

## 2012-03-07 MED ORDER — BENZONATATE 100 MG PO CAPS: 100.0000 mg | ORAL_CAPSULE | Freq: Three times a day (TID) | ORAL | Status: AC | PRN

## 2012-03-07 MED ORDER — LEVOFLOXACIN 500 MG PO TABS: 500.0000 mg | ORAL_TABLET | Freq: Every day | ORAL | Status: AC

## 2012-03-07 MED ORDER — MENTHOL 3 MG MT LOZG
1.0000 | LOZENGE | OROMUCOSAL | Status: DC | PRN
  Administered 2012-03-07: 3 mg via ORAL
  Filled 2012-03-07: qty 9

## 2012-03-07 MED ORDER — HYDROCODONE-ACETAMINOPHEN 10-325 MG PO TABS
2.0000 | ORAL_TABLET | ORAL | Status: DC | PRN
  Administered 2012-03-07: 1 via ORAL
  Filled 2012-03-07: qty 2

## 2012-03-07 MED ORDER — BENZONATATE 100 MG PO CAPS
100.0000 mg | ORAL_CAPSULE | Freq: Three times a day (TID) | ORAL | Status: DC | PRN
  Filled 2012-03-07: qty 1

## 2012-03-07 NOTE — Progress Notes (Signed)
Utilization review complete 

## 2012-03-07 NOTE — Progress Notes (Signed)
INITIAL ADULT NUTRITION ASSESSMENT Date: 03/07/2012   Time: 12:19 PM Reason for Assessment: MST  INTERVENTION: 1.  Meal/snacks; RD assisted pt in ordering meals.  Will continue to monitor for need of supplements.  Supplements not indicated at this time.   DOCUMENTATION CODES Per approved criteria  -Not Applicable     ASSESSMENT: Female 56 y.o.  Dx: Influenza and pneumonia  Hx:  Past Medical History  Diagnosis Date  . Allergic rhinitis   . Tremor   . Edema of lower extremity   . Neuropathy   . Dyspnea   . Hypothyroidism   . Anxiety   . Depression   . History of migraine headaches     only one in the past  . Breast cancer   . Nonischemic cardiomyopathy     echocardiogram done 6/12 demonstrated reduced LV function with an EF of 45-50% and grade 1 diastolic dysfunction  . CAD (coronary artery disease)     Nonobstructive by cath 7/12:  50% proximal LAD  . Congenital anomaly of superior vena cava     R Ht cath 7/12:  NL pressures, no pulmonary hypertension and normal wedge pressure; probable congenital anomaly with at least a left sided SVC going into the coronary sinus.  Right-sided sats were somewhat elevated, although they seem to be elevated uniformly from the PA, RA and RV.  consider bubble study from left arm or cardiac MRI or CT  . CHF (congestive heart failure)   . Dysrhythmia   . Pneumonia unknown  . H/O hiatal hernia     found during endoscopy in 2012  . Headache   . Arthritis     hip, knees, feet, ankles    Related Meds:  Scheduled Meds:   . amoxicillin-clavulanate  1 tablet Oral BID  . atorvastatin  20 mg Oral Daily  . atorvastatin  20 mg Oral QPC supper  . buPROPion  300 mg Oral Daily  . buPROPion  300 mg Oral Daily  . levothyroxine  75 mcg Oral Daily  . levothyroxine  75 mcg Oral Daily  . sodium chloride  500 mL Intravenous Once  . traZODone  50 mg Oral QHS  . traZODone  50 mg Oral QHS  . DISCONTD: sodium chloride   Intravenous STAT   Continuous  Infusions:   . sodium chloride 50 mL/hr at 03/06/12 2203   PRN Meds:.acetaminophen, ALPRAZolam, HYDROcodone-acetaminophen, menthol-cetylpyridinium, promethazine   Ht: 5\' 5"  (165.1 cm)  Wt: 147 lb (66.679 kg)  Ideal Wt: 56.8 kg % Ideal Wt: 117%  Usual Wt: pt unable to state Wt Readings from Last 10 Encounters:  03/07/12 147 lb (66.679 kg)  03/06/12 147 lb (66.679 kg)  03/03/12 150 lb 0.6 oz (68.058 kg)  02/11/12 151 lb (68.493 kg)  02/08/12 152 lb 4.8 oz (69.083 kg)  02/07/12 150 lb (68.04 kg)  12/09/11 153 lb (69.4 kg)  12/08/11 151 lb (68.493 kg)  11/15/11 156 lb 1.9 oz (70.816 kg)  11/08/11 158 lb (71.668 kg)   Body mass index is 24.46 kg/(m^2).  Food/Nutrition Related Hx: poor appeite/intake PTA due to acute illness  Labs:  CMP     Component Value Date/Time   NA 139 03/06/2012 1130   K 4.2 03/06/2012 1130   CL 101 03/06/2012 1130   CO2 28 03/06/2012 1130   GLUCOSE 92 03/06/2012 1130   BUN 20 03/06/2012 1130   CREATININE 0.90 03/06/2012 1130   CREATININE 0.84 06/07/2011 1614   CALCIUM 9.4 03/06/2012 1130   PROT  7.1 03/06/2012 1130   ALBUMIN 4.0 03/06/2012 1130   AST 43* 03/06/2012 1130   ALT 37* 03/06/2012 1130   ALKPHOS 101 03/06/2012 1130   BILITOT 0.3 03/06/2012 1130   GFRNONAA 71* 03/06/2012 1130   GFRAA 83* 03/06/2012 1130   Intake: 25% per pt Output:   Intake/Output Summary (Last 24 hours) at 03/07/12 1224 Last data filed at 03/07/12 0559  Gross per 24 hour  Intake    500 ml  Output   1200 ml  Net   -700 ml   No stool output since admission.  Diet Order: Cardiac  Supplements/Tube Feeding: none at this time  IVF:    sodium chloride Last Rate: 50 mL/hr at 03/06/12 2203    Estimated Nutritional Needs:   Kcal: 1870-2000 Protein: 66-80g Fluid: ~2.0 L/day  Pt reports her appetite has been poor for ~1 week since she started to feel sick.  Pt reports that she was intentionally losing wt prior to acute onset of illness.  She achieved wt loss by "watching what she was  eating and walking."   Pt has lost approximately 3 lb since last Friday, representing 2% wt change in one week.  This wt change likely includes a component of dehydration.  RD is able to assist pt in ordering a meal she feels she can tolerate.  NUTRITION DIAGNOSIS: -Inadequate oral intake (NI-2.1).  Status: Ongoing  RELATED TO: poor appetite  AS EVIDENCE BY: pt report, 2% wt change in <1 week.  MONITORING/EVALUATION(Goals): 1.  Food/Beverage; pt to consume >50% of meals. 2.  Wt/wt change; monitor trends.  EDUCATION NEEDS: -Education needs addressed r/t hydration needs   Dietitian #: [phone]  Loyce Dys Sue-Ellen 03/07/2012, 12:19 PM

## 2012-03-07 NOTE — Progress Notes (Signed)
Pt discharged to home with husband. Discharge instructions explain pt verbalized understanding. IV'd/c

## 2012-03-07 NOTE — Discharge Summary (Signed)
Physician Discharge Summary  [name]:[id number] DOB: 1956/06/07 DOA: 03/06/2012  PCP: Estill Cotta, MD  Admit date: 03/06/2012 Discharge date: 03/07/2012  Recommendations for Outpatient Follow-up:  Follow up with PCP in one week. Discharge Diagnoses:  Principal Problem:  *Possible Influenza and pneumonia-more likely simple Sinusitis  Active Problems:  ANXIETY  DYSPNEA  BREAST CANCER, HX OF  Depression  Hypothyroidism  Nonischemic cardiomyopathy  Anomalous SVC  Insomnia   Discharge Condition: stable    Filed Weights   03/07/12 0604  Weight: 66.679 kg (147 lb)    History of present illness:   [name] is a 56 y.o. female transferred over from that center [facility]-states that this all started after she spent a week with friedn at the [facility] friend is from [place] and was admitted to the Hospital on the 4th floor-She did not have any infection. Wed of last week started feeling like she was gettig a cold-no sick contact but was in the hopsital most of the time and stayed overnight one-two nights. Last Friday went into see Daryel Gerald and was placed on Augmentin. She had a fever over the weekend and it went does.  Awoke this am and had a hard time breathing and had a temp of 100.8, Had some dizzyness and felt like she had been wheezing and had some chest tightness and some mylagias, Blood pressures is usually 90/70 and has some spotting in her Urine and was seen by Ms Lendell Caprice and was transferred here.  Has a h/o sinusitis and was swollen in the face on Friday and took the Augmentin-has pain in her teeth in both upper and loweer teeth.   Hospital Course:  1. Acute viral Illness/ Viral bronchitis/ Atypical Bronchitis: all the work for pneumonia, UTI, influenza and strepto was negative. She was prescribed augmentin with relief of sinusitis, but she has low grade temperatures and feel week. Her vitals remain stable. She reported that she would like to go home  and get rest . Recommended a course of levaquin to cover atypical upper respiratory tract infections and possibly early pneumonia.   Resume home medications on discharge.      Procedures: none  Consultations:  none  Discharge Exam: Filed Vitals:   03/06/12 2114 03/07/12 0600 03/07/12 0604 03/07/12 1350  BP: 137/67 105/61  117/54  Pulse: 76 75    Temp: 98.1 F (36.7 C) 98.4 F (36.9 C)  98.8 F (37.1 C)  TempSrc: Oral Oral  Oral  Resp: 20 18  18   Height:   5\' 5"  (1.651 m)   Weight:   66.679 kg (147 lb)   SpO2: 99% 96%  96%    General: alert afebrile comfortable Cardiovascular: s1s2 Respiratory: CTAB  Discharge Instructions  Discharge Orders    Future Appointments: Provider: Department: Dept Phone: Center:   03/08/2012 2:30 PM [facility] 1 [facility] [phone] [facility]   08/07/2012 11:00 AM Edwyna Perfect, MD [place] [phone] [facility]   02/07/2013 1:30 PM Randall An, MD [facility] [phone] None     Future Orders Please Complete By Expires   Discharge instructions      Comments:   Follow up with PCP ino ne week.   Activity as tolerated - No restrictions        Medication List  As of 03/07/2012  5:08 PM   STOP taking these medications         amoxicillin-clavulanate 875-125 MG per tablet  TAKE these medications         acetaminophen 500 MG tablet   Commonly known as: TYLENOL   Take 1,000 mg by mouth every 6 (six) hours as needed. For fever.      ALPRAZolam 0.25 MG dissolvable tablet   Commonly known as: NIRAVAM   Take 0.25 mg by mouth 3 (three) times daily as needed. For anxiety      atorvastatin 20 MG tablet   Commonly known as: LIPITOR   Take 20 mg by mouth daily.      benzonatate 100 MG capsule   Commonly known as: TESSALON   Take 1 capsule (100 mg total) by mouth 3 (three) times daily as needed for cough.      buPROPion 300 MG 24 hr tablet   Commonly known as: WELLBUTRIN XL   Take 300 mg  by mouth daily.      guaiFENesin-dextromethorphan 100-10 MG/5ML syrup   Commonly known as: ROBITUSSIN DM   Take 5 mLs by mouth every 4 (four) hours as needed for cough.      HYDROcodone-acetaminophen 10-325 MG per tablet   Commonly known as: NORCO   Take 2 tablets by mouth every 4 (four) hours as needed.      HYDROcodone-acetaminophen 10-325 MG per tablet   Commonly known as: NORCO   Take 1 tablet by mouth as needed. For pain.      levofloxacin 500 MG tablet   Commonly known as: LEVAQUIN   Take 1 tablet (500 mg total) by mouth daily.      levothyroxine 75 MCG tablet   Commonly known as: SYNTHROID, LEVOTHROID   Take 75 mcg by mouth daily.      traZODone 50 MG tablet   Commonly known as: DESYREL   Take 50 mg by mouth at bedtime. For anxiety              The results of significant diagnostics from this hospitalization (including imaging, microbiology, ancillary and laboratory) are listed below for reference.    Significant Diagnostic Studies: X-ray Chest Pa And Lateral   03/07/2012  *RADIOLOGY REPORT*  Clinical Data: Cough, congestion, shortness of breath  CHEST - 2 VIEW  Comparison: 03/06/2012  Findings: Vascular clips in the right axilla.  Pectus deformity. Lungs clear.  Heart size normal.  No effusion.  IMPRESSION:  No acute disease   Original Report Authenticated By: Osa Craver, M.D.    Dg Chest 2 View  03/06/2012  *RADIOLOGY REPORT*  Clinical Data: Cough.  Congestion.  Fever.  Mastectomy. Cardiomyopathy.  CHEST - 2 VIEW  Comparison: 11/18/2010  Findings: There is pectus deformity of the chest.  There is been previous partial mastectomy on the right with multiple clips in the axilla.  There is chronic indistinctness of the right heart border presumed to relate to scarring and/or of the chest deformity.  No sign of acute infiltrate, mass, collapse or effusion.  IMPRESSION: Pectus deformity at chest.  Chest wall surgery on the right.  No active process evident.    Original Report Authenticated By: Thomasenia Sales, M.D.    Ct Maxillofacial Wo Cm  03/06/2012  *RADIOLOGY REPORT*  Clinical Data:  Sinus congestion and drainage.  History of breast cancer.  CT LIMITED SINUSES WITHOUT CONTRAST  Technique:  Multidetector CT images of the paranasal sinuses were obtained in a single plane without contrast.  Comparison:  11/01/2011 CT maxillofacial.  Findings:  The sphenoid sinus is clear.  The maxillary sinuses show only minimal  dependent mucosal thickening.  There is slight mucosal thickening in the left ethmoid air cells.  The frontal sinuses are clear.  There is slight nasal septal deviation of 2 mm right-to-left.  The visualized intracranial compartment is unremarkable.  There is no osseous destructive or sclerotic lesion.  A similar appearance was seen on prior examination.  IMPRESSION: No evidence for acute sinusitis.  Minimal chronic mucosal thickening.   Original Report Authenticated By: Elsie Stain, M.D.     Microbiology: Recent Results (from the past 240 hour(s))  CULTURE, BLOOD (ROUTINE X 2)     Status: Normal (Preliminary result)   Collection Time   03/06/12 11:30 AM      Component Value Range Status Comment   Specimen Description BLOOD LEFT ARM   Final    Special Requests NONE BOTTLES DRAWN AEROBIC AND ANAEROBIC 10CC   Final    Culture  Setup Time 03/06/2012 18:43   Final    Culture     Final    Value:        BLOOD CULTURE RECEIVED NO GROWTH TO DATE CULTURE WILL BE HELD FOR 5 DAYS BEFORE ISSUING A FINAL NEGATIVE REPORT   Report Status PENDING   Incomplete   CULTURE, BLOOD (ROUTINE X 2)     Status: Normal (Preliminary result)   Collection Time   03/06/12 12:10 PM      Component Value Range Status Comment   Specimen Description BLOOD LEFT HAND   Final    Special Requests NONE BOTTLES DRAWN AEROBIC AND ANAEROBIC 5CC   Final    Culture  Setup Time 03/06/2012 18:43   Final    Culture     Final    Value:        BLOOD CULTURE RECEIVED NO GROWTH TO DATE  CULTURE WILL BE HELD FOR 5 DAYS BEFORE ISSUING A FINAL NEGATIVE REPORT   Report Status PENDING   Incomplete      Labs: Basic Metabolic Panel:  Lab 03/06/12 1610  NA 139  K 4.2  CL 101  CO2 28  GLUCOSE 92  BUN 20  CREATININE 0.90  CALCIUM 9.4  MG --  PHOS --   Liver Function Tests:  Lab 03/06/12 1130  AST 43*  ALT 37*  ALKPHOS 101  BILITOT 0.3  PROT 7.1  ALBUMIN 4.0   No results found for this basename: LIPASE:5,AMYLASE:5 in the last 168 hours No results found for this basename: AMMONIA:5 in the last 168 hours CBC:  Lab 03/07/12 0530 03/06/12 1130  WBC 5.0 6.2  NEUTROABS -- 3.9  HGB 13.0 14.8  HCT 38.6 43.7  MCV 96.0 95.6  PLT 168 191   Cardiac Enzymes: No results found for this basename: CKTOTAL:5,CKMB:5,CKMBINDEX:5,TROPONINI:5 in the last 168 hours BNP: BNP (last 3 results) No results found for this basename: PROBNP:3 in the last 8760 hours CBG: No results found for this basename: GLUCAP:5 in the last 168 hours  Time coordinating discharge: 30 minutes  Signed:  Tiffane Sheldon  [facility] 03/07/2012, 5:08 PM

## 2012-03-08 ENCOUNTER — Other Ambulatory Visit: Payer: Medicare Other

## 2012-03-08 ENCOUNTER — Telehealth: Payer: Self-pay | Admitting: *Deleted

## 2012-03-08 NOTE — Telephone Encounter (Signed)
Pt reports that she was admitted to [facility] on Mon, 09.09.13 & d/c today 09.11.13 with information that does not correlate from hospital encounter/paperwork as to what she was told; such as, Dx: Influenza & Pneumonia on paperwork, told that she did not have either, only an "Atypical Viral Illness" when d/c today. Patient instructions: Resume normal activities, no restrictions; pt is very upset as she is still running fever [100-101, informed was not urgent symptom alone] and seems to be somewhat dehydrated, as well; states that she has filed "formal complaint" against [facility] hospital today. Discussed dehydration symptoms and how to avoid [pt asymptomatic of urgent sxs], states she is keeping fluids in system [mostly Ginger Ale, discussed no sugar; dairy; fatty fluids to avoid diarrhea that can accelerate dehydration]. Discussed eating bland food items [white rice, dry toast & clear broths, as tolerated to control nausea w/ABX]. This is 2nd round of ABX Hildred Alamin, NP on 09.09.13]: Dehydration - O'SULLIVAN,MELISSA S., NP 03/06/2012 9:29 AM Signed Acutely ill appearing female presents today with hypotension, tachycardia, sore throat and poor po intake. She is also wheezing with a low grade temperature. I feel at this point she should be evaluated in ED for some stat labs, CXR and IV hydration. I suspect she may have developed pneumonia. Pt is agreeable to proceed to ED. Report given to Vickie- charge nurse in ED. Pt to be brought downstairs by wheelchair.   Patient is to call office tomorrow 09.12.13 with status update, so that we can make A&E for F/U OV and/or the need to be seen earlier [facility] standard 7-10 day] in office or ED/UC facility/SLS

## 2012-03-09 LAB — URINE CULTURE: Colony Count: 35000

## 2012-03-10 NOTE — Telephone Encounter (Signed)
Attempted to reach patient for follow-up call; [facility] with contact name & number for return call RE: pt medical condition post hospital and to discuss follow-up OV/SLS

## 2012-03-12 LAB — CULTURE, BLOOD (ROUTINE X 2)
Culture: NO GROWTH
Culture: NO GROWTH

## 2012-03-13 NOTE — Telephone Encounter (Signed)
LMOM [2nd] with contact name & number for patient call back RE: pt medical condition post hospital and to discuss f/u OV/SLS

## 2012-03-15 ENCOUNTER — Encounter: Payer: Self-pay | Admitting: Family

## 2012-03-15 ENCOUNTER — Ambulatory Visit (INDEPENDENT_AMBULATORY_CARE_PROVIDER_SITE_OTHER): Payer: Medicare Other | Admitting: Family

## 2012-03-15 VITALS — BP 100/76 | HR 109 | Temp 97.6°F | Resp 18 | Wt 146.0 lb

## 2012-03-15 DIAGNOSIS — J0100 Acute maxillary sinusitis, unspecified: Secondary | ICD-10-CM

## 2012-03-15 MED ORDER — CEFDINIR 300 MG PO CAPS: 300.0000 mg | ORAL_CAPSULE | Freq: Two times a day (BID) | ORAL | Status: DC

## 2012-03-15 MED ORDER — ALBUTEROL SULFATE HFA 108 (90 BASE) MCG/ACT IN AERS: 2.0000 | INHALATION_SPRAY | Freq: Four times a day (QID) | RESPIRATORY_TRACT | Status: DC | PRN

## 2012-03-15 NOTE — Patient Instructions (Addendum)
Please call us on Friday morning to give [place] an update on how you are feeling. Call sooner if symptoms worsen.  Follow up in 1 week.

## 2012-03-15 NOTE — Progress Notes (Signed)
Subjective:    Patient ID: [name], female    DOB: [birth year], 56 y.o.   MRN: [id number]  HPI  Ms.  Grout is a 56 yr old female who presents today for hospital follow up.  She was seen in the office on 9/9 with severe sore throat, weakness and dehydration.  She was sent to the ED and transferred to [facility] for admission.  She was admitted 9/9-9/10.  She underwent extensive work up which has been reviewed.  Flu test ng, urine culture grew 35 K e coli sensitive to levaquin, blood culture neg, mild elevation of AST/ALT, neg rapid strep and an unremarkable CBC.  CXR was negative for pneumonia but she was covered empirically with levaquin.    She reports low grade fever 100.2- last was 2 days ago.  Still blowing green out of her nose.  + HA, some right neck stiffness.  Some wheezing.  Energy is not great.  Feels like it is hart to get a deep breath.  Left cheek bone sore.    Review of Systems See HPI  Past Medical History  Diagnosis Date  . Allergic rhinitis   . Tremor   . Edema of lower extremity   . Neuropathy   . Dyspnea   . Hypothyroidism   . Anxiety   . Depression   . History of migraine headaches     only one in the past  . Breast cancer   . Nonischemic cardiomyopathy     echocardiogram done 6/12 demonstrated reduced LV function with an EF of 45-50% and grade 1 diastolic dysfunction  . CAD (coronary artery disease)     Nonobstructive by cath 7/12:  50% proximal LAD  . Congenital anomaly of superior vena cava     R Ht cath 7/12:  NL pressures, no pulmonary hypertension and normal wedge pressure; probable congenital anomaly with at least a left sided SVC going into the coronary sinus.  Right-sided sats were somewhat elevated, although they seem to be elevated uniformly from the PA, RA and RV.  consider bubble study from left arm or cardiac MRI or CT  . CHF (congestive heart failure)   . Dysrhythmia   . Pneumonia unknown  . H/O hiatal hernia     found during endoscopy in 2012  .  Headache   . Arthritis     hip, knees, feet, ankles    History   Social History  . Marital Status: Married    Spouse Name: N/A    Number of Children: N/A  . Years of Education: N/A   Occupational History  . homemaker    Social History Main Topics  . Smoking status: Never Smoker   . Smokeless tobacco: Never Used  . Alcohol Use: 0.6 oz/week    1 Glasses of wine per week     occasionally   . Drug Use: No  . Sexually Active: Yes    Birth Control/ Protection: Post-menopausal   Other Topics Concern  . Not on file   Social History Narrative   Lives in [place] been married for 4 yerars.  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 Bone marrow transplant 01/95  . Cervical conization w/bx   . Arthoscopy     rt. knee  . Axillary node dissection   . Bone marrow harvest   . Port-a-cath removed   . Hickman placed   . Bone marrow  transplant   . Biopsy breast     L & R  . Combined hysteroscopy diagnostic / d&c   . Colonoscopy   . Cardiac catheterization   . Mastectomy, partial 1994    right    Family History  Problem Relation Age of Onset  . COPD Mother   . Breast cancer Maternal Grandmother   . Tuberculosis Maternal Grandmother   . Stroke Maternal Grandmother   . Allergies Father   . Heart disease Father   . Stroke Father   . Heart attack Father   . Allergies Sister   . Deep vein thrombosis Sister     Allergies  Allergen Reactions  . Codeine   . Cymbalta (Duloxetine Hcl)     Personality changes  . Lamictal (Lamotrigine)   . Morphine And Related   . Sertraline Hcl     Current Outpatient Prescriptions on File Prior to Visit  Medication Sig Dispense Refill  . acetaminophen (TYLENOL) 500 MG tablet Take 1,000 mg by mouth every 6 (six) hours as needed. For fever.      . ALPRAZolam (NIRAVAM) 0.25 MG dissolvable tablet Take 0.25 mg by mouth 3 (three) times daily as needed. For  anxiety      . buPROPion (WELLBUTRIN XL) 300 MG 24 hr tablet Take 300 mg by mouth daily.      Marland Kitchen HYDROcodone-acetaminophen (NORCO) 10-325 MG per tablet Take 1 tablet by mouth as needed. For pain.      Marland Kitchen levothyroxine (SYNTHROID, LEVOTHROID) 75 MCG tablet Take 75 mcg by mouth daily.      . traZODone (DESYREL) 50 MG tablet Take 50 mg by mouth at bedtime. For anxiety      . albuterol (PROVENTIL HFA;VENTOLIN HFA) 108 (90 BASE) MCG/ACT inhaler Inhale 2 puffs into the lungs every 6 (six) hours as needed for wheezing.  1 Inhaler  0  . atorvastatin (LIPITOR) 20 MG tablet Take 20 mg by mouth daily.        BP 100/76  Pulse 109  Temp 97.6 F (36.4 C) (Oral)  Resp 18  Wt 146 lb 0.6 oz (66.243 kg)  SpO2 99%       Objective:   Physical Exam  Constitutional: She appears well-developed and well-nourished. No distress.  HENT:  Head: Normocephalic and atraumatic.       Maxillary sinus tenderness to palpation bilaterally L>R  Eyes: Conjunctivae normal are normal.  Cardiovascular: Normal rate and regular rhythm.   No murmur heard. Pulmonary/Chest: Effort normal and breath sounds normal. No respiratory distress. She has no wheezes. She has no rales. She exhibits no tenderness.  Abdominal: Soft.  Musculoskeletal: She exhibits no edema.  Lymphadenopathy:    She has no cervical adenopathy.  Psychiatric: She has a normal mood and affect. Her behavior is normal. Judgment and thought content normal.          Assessment & Plan:

## 2012-03-17 ENCOUNTER — Telehealth: Payer: Self-pay | Admitting: *Deleted

## 2012-03-17 NOTE — Telephone Encounter (Signed)
Received message from pt that she has not run a fever since her last office visit. Reports reduction of wheezing and feels some improved. Just wanted to let us know.

## 2012-03-20 ENCOUNTER — Telehealth: Payer: Self-pay | Admitting: *Deleted

## 2012-03-20 NOTE — Telephone Encounter (Signed)
If she feels up to it and is AF then ok

## 2012-03-20 NOTE — Telephone Encounter (Signed)
Pt left message on voicemail that she is feeling much better but still has some light symptoms. She will take her last antibiotic today. Wants to know when she should be able to get her flu vaccine?  Please advise.

## 2012-03-20 NOTE — Assessment & Plan Note (Signed)
Will rx with cefdinir.  Pt to call and let us know how she is doing on Friday.

## 2012-03-21 ENCOUNTER — Other Ambulatory Visit: Payer: Self-pay | Admitting: Internal Medicine

## 2012-03-21 NOTE — Telephone Encounter (Signed)
Done/SLS 

## 2012-03-21 NOTE — Telephone Encounter (Signed)
Left message on home # to return my call. 

## 2012-03-22 NOTE — Telephone Encounter (Signed)
[name], [facility] 03/21/2012 4:38 PM Signed  Left message on home # to return my call. Estill Cotta, MD 03/20/2012 3:47 PM Signed  If she feels up to it and is AF then ok [name], [facility] 03/20/2012 3:41 PM Signed  Pt left message on voicemail that she is feeling much better but still has some light symptoms. She will take her last antibiotic today. Wants to know when she should be able to get her flu vaccine? Please advise.

## 2012-03-27 ENCOUNTER — Encounter: Payer: Self-pay | Admitting: Internal Medicine

## 2012-03-27 ENCOUNTER — Ambulatory Visit (INDEPENDENT_AMBULATORY_CARE_PROVIDER_SITE_OTHER): Payer: Medicare Other | Admitting: Internal Medicine

## 2012-03-27 VITALS — BP 116/72 | HR 86 | Temp 97.8°F | Resp 14 | Wt 150.5 lb

## 2012-03-27 DIAGNOSIS — J329 Chronic sinusitis, unspecified: Secondary | ICD-10-CM

## 2012-03-27 MED ORDER — FLUTICASONE PROPIONATE 50 MCG/ACT NA SUSP: 2.0000 | Freq: Every day | NASAL | Status: DC

## 2012-03-27 NOTE — Progress Notes (Signed)
Subjective:    Patient ID: [name], female    DOB: 1956/01/05, 56 y.o.   MRN: [id number]  HPI Pt presents to clinic for follow up of possible sinusitis. Has persistent symptoms of left greater than right maxillary sinus pain and tenderness. His nasal discharge described as green. Has associated dizziness and malaise. Now status post at least two courses of antibiotics including hospitalization. Sinus CT without findings suggestive of acute sinusitis.  Past Medical History  Diagnosis Date  . Allergic rhinitis   . Tremor   . Edema of lower extremity   . Neuropathy   . Dyspnea   . Hypothyroidism   . Anxiety   . Depression   . History of migraine headaches     only one in the past  . Breast cancer   . Nonischemic cardiomyopathy     echocardiogram done 6/12 demonstrated reduced LV function with an EF of 45-50% and grade 1 diastolic dysfunction  . CAD (coronary artery disease)     Nonobstructive by cath 7/12:  50% proximal LAD  . Congenital anomaly of superior vena cava     R Ht cath 7/12:  NL pressures, no pulmonary hypertension and normal wedge pressure; probable congenital anomaly with at least a left sided SVC going into the coronary sinus.  Right-sided sats were somewhat elevated, although they seem to be elevated uniformly from the PA, RA and RV.  consider bubble study from left arm or cardiac MRI or CT  . CHF (congestive heart failure)   . Dysrhythmia   . Pneumonia unknown  . H/O hiatal hernia     found during endoscopy in 2012  . Headache   . Arthritis     hip, knees, feet, ankles   Past Surgical History  Procedure Date  . Bone marrow transplant 01/95  . Cervical conization w/bx   . Arthoscopy     rt. knee  . Axillary node dissection   . Bone marrow harvest   . Port-a-cath removed   . Hickman placed   . Bone marrow transplant   . Biopsy breast     L & R  . Combined hysteroscopy diagnostic / d&c   . Colonoscopy   . Cardiac catheterization   . Mastectomy,  partial 1994    right    reports that she has never smoked. She has never used smokeless tobacco. She reports that she drinks about .6 ounces of alcohol per week. She reports that she does not use illicit drugs. family history includes Allergies in her father and sister; Breast cancer in her maternal grandmother; COPD in her mother; Deep vein thrombosis in her sister; Heart attack in her father; Heart disease in her father; Stroke in her father and maternal grandmother; and Tuberculosis in her maternal grandmother. Allergies  Allergen Reactions  . Codeine   . Cymbalta (Duloxetine Hcl)     Personality changes  . Lamictal (Lamotrigine)   . Morphine And Related   . Sertraline Hcl      Review of Systems see hpi     Objective:   Physical Exam  Nursing note and vitals reviewed. Constitutional: She appears well-developed and well-nourished. No distress.  HENT:  Head: Normocephalic and atraumatic.  Right Ear: External ear normal.  Left Ear: External ear normal.  Nose: Right sinus exhibits maxillary sinus tenderness. Right sinus exhibits no frontal sinus tenderness. Left sinus exhibits maxillary sinus tenderness. Left sinus exhibits no frontal sinus tenderness.  Mouth/Throat: Oropharynx is clear and moist. No oropharyngeal  exudate.  Eyes: Conjunctivae normal and EOM are normal. No scleral icterus.  Neck: Neck supple.  Neurological: She is alert.  Skin: She is not diaphoretic.  Psychiatric: She has a normal mood and affect.          Assessment & Plan:

## 2012-03-27 NOTE — Assessment & Plan Note (Signed)
Clinical presentation consistent with sinusitis despite sinus CT and failed multiple courses of antibiotics. Proceed with ENT consult. Attempt Flonase.

## 2012-03-30 NOTE — Telephone Encounter (Signed)
Left message on cell# to return my call. 

## 2012-03-30 NOTE — Telephone Encounter (Signed)
Spoke with pt, she discussed this at last office visit and is aware.

## 2012-04-20 ENCOUNTER — Ambulatory Visit
Admission: RE | Admit: 2012-04-20 | Discharge: 2012-04-20 | Disposition: A | Discharge status: Home / Self Care | Payer: Medicare Other | Source: Ambulatory Visit | Attending: Obstetrics & Gynecology | Admitting: Obstetrics & Gynecology

## 2012-04-20 DIAGNOSIS — M949 Disorder of cartilage, unspecified: Secondary | ICD-10-CM

## 2012-04-20 DIAGNOSIS — M899 Disorder of bone, unspecified: Secondary | ICD-10-CM

## 2012-05-03 ENCOUNTER — Encounter: Payer: Self-pay | Admitting: Internal Medicine

## 2012-05-03 ENCOUNTER — Ambulatory Visit (INDEPENDENT_AMBULATORY_CARE_PROVIDER_SITE_OTHER): Payer: Medicare Other | Admitting: Internal Medicine

## 2012-05-03 VITALS — BP 116/68 | HR 68 | Temp 98.5°F | Resp 14 | Wt 151.0 lb

## 2012-05-03 DIAGNOSIS — R221 Localized swelling, mass and lump, neck: Secondary | ICD-10-CM | POA: Insufficient documentation

## 2012-05-03 DIAGNOSIS — Z23 Encounter for immunization: Secondary | ICD-10-CM

## 2012-05-03 DIAGNOSIS — R220 Localized swelling, mass and lump, head: Secondary | ICD-10-CM

## 2012-05-03 NOTE — Progress Notes (Signed)
Subjective:    Patient ID: [name], female    DOB: 10/13/1955, 56 y.o.   MRN: [id number]  HPI Pt presents to clinic for evaluation of knot at jawline. There is enlargement of possible gland just below the jaw line for the past several months. Believes it may have increased in size. No pain sweats or weight loss. Does have history of breast cancer and was last evaluated by her oncologist in August. Has completed multiple rounds of antibiotics recent sinusitis with no appreciable affect on this area. Saw ENT because of recurrent sinusitis. Records unavailable however patient states encouraged her to followup with her oncologist. Was not informed of any specific abnormality.  Past Medical History  Diagnosis Date  . Allergic rhinitis   . Tremor   . Edema of lower extremity   . Neuropathy   . Dyspnea   . Hypothyroidism   . Anxiety   . Depression   . History of migraine headaches     only one in the past  . Breast cancer   . Nonischemic cardiomyopathy     echocardiogram done 6/12 demonstrated reduced LV function with an EF of 45-50% and grade 1 diastolic dysfunction  . CAD (coronary artery disease)     Nonobstructive by cath 7/12:  50% proximal LAD  . Congenital anomaly of superior vena cava     R Ht cath 7/12:  NL pressures, no pulmonary hypertension and normal wedge pressure; probable congenital anomaly with at least a left sided SVC going into the coronary sinus.  Right-sided sats were somewhat elevated, although they seem to be elevated uniformly from the PA, RA and RV.  consider bubble study from left arm or cardiac MRI or CT  . CHF (congestive heart failure)   . Dysrhythmia   . Pneumonia unknown  . H/O hiatal hernia     found during endoscopy in 2012  . Headache   . Arthritis     hip, knees, feet, ankles   Past Surgical History  Procedure Date  . Bone marrow transplant 01/95  . Cervical conization w/bx   . Arthoscopy     rt. knee  . Axillary node dissection   . Bone  marrow harvest   . Port-a-cath removed   . Hickman placed   . Bone marrow transplant   . Biopsy breast     L & R  . Combined hysteroscopy diagnostic / d&c   . Colonoscopy   . Cardiac catheterization   . Mastectomy, partial 1994    right    reports that she has never smoked. She has never used smokeless tobacco. She reports that she drinks about .6 ounces of alcohol per week. She reports that she does not use illicit drugs. family history includes Allergies in her father and sister; Breast cancer in her maternal grandmother; COPD in her mother; Deep vein thrombosis in her sister; Heart attack in her father; Heart disease in her father; Stroke in her father and maternal grandmother; and Tuberculosis in her maternal grandmother. Allergies  Allergen Reactions  . Codeine   . Cymbalta (Duloxetine Hcl)     Personality changes  . Lamictal (Lamotrigine)   . Morphine And Related   . Sertraline Hcl      Review of Systems see hpi     Objective:   Physical Exam  Nursing note and vitals reviewed. Constitutional: She appears well-developed and well-nourished. No distress.  HENT:  Head: Normocephalic and atraumatic.  Right Ear: External ear normal.  Left  Ear: External ear normal.  Eyes: Conjunctivae normal are normal. No scleral icterus.  Neck: Neck supple.       Small approximately 1 cm soft tissue mass located below the jawline. Smooth and well-circumscribed.  Lymphadenopathy:    She has no cervical adenopathy.  Neurological: She is alert.  Skin: She is not diaphoretic.  Psychiatric: She has a normal mood and affect.          Assessment & Plan:

## 2012-05-03 NOTE — Assessment & Plan Note (Signed)
May represent prominent submandibular gland. Cannot exclude discrete mass. Status post multiple courses of antibiotics already. Discussed possible CT imaging of the neck. Patient wonders about followup with oncology and agree that this would be appropriate. She states she will call and make the appointment.

## 2012-05-08 ENCOUNTER — Encounter (HOSPITAL_COMMUNITY): Payer: Medicare Other | Attending: Oncology | Admitting: Oncology

## 2012-05-08 ENCOUNTER — Encounter (HOSPITAL_COMMUNITY): Payer: Self-pay | Admitting: Oncology

## 2012-05-08 VITALS — BP 104/67 | HR 75 | Temp 98.1°F | Resp 16 | Wt 150.6 lb

## 2012-05-08 DIAGNOSIS — R599 Enlarged lymph nodes, unspecified: Secondary | ICD-10-CM

## 2012-05-08 DIAGNOSIS — Z9481 Bone marrow transplant status: Secondary | ICD-10-CM

## 2012-05-08 DIAGNOSIS — Z853 Personal history of malignant neoplasm of breast: Secondary | ICD-10-CM

## 2012-05-08 NOTE — Addendum Note (Signed)
Addended by: Ellouise Newer on: 05/08/2012 05:50 PM   Modules accepted: Level of Service

## 2012-05-08 NOTE — Patient Instructions (Signed)
[facility]  Discharge Instructions  RECOMMENDATIONS MADE BY THE CONSULTANT AND ANY TEST RESULTS WILL BE SENT TO YOUR REFERRING DOCTOR.   EXAM FINDINGS BY MD TODAY AND SIGNS AND SYMPTOMS TO REPORT TO CLINIC OR PRIMARY MD: Exam findings as discussed by T. Kefalas, PA-C.  SPECIAL INSTRUCTIONS/FOLLOW-UP: 1.  We did lab work on you today, please feel free to contact us if you haven't heard back from the labs by this Wednesday. 2.  Please keep your appointment 6 weeks from now to see Dr. Mariel Sleet.  Contact us sooner if you have concerns we can assist you with.  I acknowledge that I have been informed and understand all the instructions given to me and received a copy. I do not have any more questions at this time, but understand that I may call the Specialty Clinic at [facility] at [phone] during business hours should I have any further questions or need assistance in obtaining follow-up care.    __________________________________________  _____________  __________ Signature of Patient or Authorized Representative            Date                   Time    __________________________________________ Nurse's Signature

## 2012-05-08 NOTE — Progress Notes (Signed)
Christina Cotta, MD [address] [place] [address]  1. BREAST CANCER, HX OF   2. History of bone marrow transplant     CURRENT THERAPY: Yearly observation  INTERVAL HISTORY: [name] 56 y.o. female returns for evaluation of a left submandibular node noted on physical examination after being admitted for acute sinusitis.     [name] was admitted for a left-sided acute sinusitis in September 2013.  She followed up with PCP after discharge and it was appreciated that she had small submandibular lymph node on examination.  Therefore, the patient made an appointment at the [facility] for follow-up on "lymphadenopathy."  [name] is seen and she is doing well since her admission to the hospital for sinusitis.  She reports that her sinusitis was left-sided.  She admits to fevers during her acute infection.  Today she denies any complaints.  She does admit to night sweats that are not drenching.  Her appetite is stable and she is not losing weight .  No B symptoms.   Past Medical History  Diagnosis Date  . Allergic rhinitis   . Tremor   . Edema of lower extremity   . Neuropathy   . Dyspnea   . Hypothyroidism   . Anxiety   . Depression   . History of migraine headaches     only one in the past  . Breast cancer   . Nonischemic cardiomyopathy     echocardiogram done 6/12 demonstrated reduced LV function with an EF of 45-50% and grade 1 diastolic dysfunction  . CAD (coronary artery disease)     Nonobstructive by cath 7/12:  50% proximal LAD  . Congenital anomaly of superior vena cava     R Ht cath 7/12:  NL pressures, no pulmonary hypertension and normal wedge pressure; probable congenital anomaly with at least a left sided SVC going into the coronary sinus.  Right-sided sats were somewhat elevated, although they seem to be elevated uniformly from the PA, RA and RV.  consider bubble study from left arm or cardiac MRI or CT  . CHF (congestive heart failure)   .  Dysrhythmia   . Pneumonia unknown  . H/O hiatal hernia     found during endoscopy in 2012  . Headache   . Arthritis     hip, knees, feet, ankles    has ANXIETY; ALLERGIC RHINITIS; DYSPNEA; BREAST CANCER, HX OF; Healthcare maintenance; History of bone marrow transplant; Peripheral neuropathy; Depression; Hypothyroidism; Nonischemic cardiomyopathy; Nonobstructive CAD; Anomalous SVC; Insomnia; Hyperlipidemia; Rosacea; Neck pain; Sinusitis; and Mass of left side of neck on her problem list.     is allergic to codeine; cymbalta; lamictal; morphine and related; and sertraline hcl.  [name] does not currently have medications on file.  Past Surgical History  Procedure Date  . Bone marrow transplant 01/95  . Cervical conization w/bx   . Arthoscopy     rt. knee  . Axillary node dissection   . Bone marrow harvest   . Port-a-cath removed   . Hickman placed   . Bone marrow transplant   . Biopsy breast     L & R  . Combined hysteroscopy diagnostic / d&c   . Colonoscopy   . Cardiac catheterization   . Mastectomy, partial 1994    right    Denies any headaches, dizziness, double vision, fevers, chills, night sweats, nausea, vomiting, diarrhea, constipation, chest pain, heart palpitations, shortness of breath, blood in stool, black tarry stool, urinary  pain, urinary burning, urinary frequency, hematuria.   PHYSICAL EXAMINATION  ECOG PERFORMANCE STATUS: 0 - Asymptomatic  Filed Vitals:   05/08/12 1439  BP: 104/67  Pulse: 75  Temp: 98.1 F (36.7 C)  Resp: 16    GENERAL:alert, no distress, well nourished, well developed, comfortable, cooperative and smiling SKIN: skin color, texture, turgor are normal, no rashes or significant lesions HEAD: Normocephalic, No masses, lesions, tenderness or abnormalities EYES: normal EARS: External ears normal OROPHARYNX:mucous membranes are moist  NECK: supple, trachea midline, left, angle of the jaw, sub- centimeter thickening. LUNGS: clear to  auscultation and percussion HEART: regular rate & rhythm, no murmurs, no gallops, S1 normal and S2 normal LYMPH: No axillary lymphadenopathy noted.  No supra-infraclavicular lymph nodes noted.  NEURO: alert & oriented x 3 with fluent speech, no focal motor/sensory deficits, gait normal     LABORATORY DATA: CBC    Component Value Date/Time   WBC 5.0 03/07/2012 0530   RBC 4.02 03/07/2012 0530   HGB 13.0 03/07/2012 0530   HCT 38.6 03/07/2012 0530   PLT 168 03/07/2012 0530   MCV 96.0 03/07/2012 0530   MCH 32.3 03/07/2012 0530   MCHC 33.7 03/07/2012 0530   RDW 12.3 03/07/2012 0530   LYMPHSABS 1.4 03/06/2012 1130   MONOABS 0.8 03/06/2012 1130   EOSABS 0.0 03/06/2012 1130   BASOSABS 0.0 03/06/2012 1130      Chemistry      Component Value Date/Time   NA 139 03/06/2012 1130   K 4.2 03/06/2012 1130   CL 101 03/06/2012 1130   CO2 28 03/06/2012 1130   BUN 20 03/06/2012 1130   CREATININE 0.90 03/06/2012 1130   CREATININE 0.84 06/07/2011 1614      Component Value Date/Time   CALCIUM 9.4 03/06/2012 1130   ALKPHOS 101 03/06/2012 1130   AST 43* 03/06/2012 1130   ALT 37* 03/06/2012 1130   BILITOT 0.3 03/06/2012 1130          ASSESSMENT:  1. Right sided breast cancer. Stage II adenocarcinoma of right breast presenting with multiple lymph nodes but no obvious primary in the right breast. 11 positive nodes. ER positive, PR negative. S/P bone marrow transplant on CALGB 9082 [facility] protocol. S/P 4 cycles of CAF followed by high dose Cisplatin, Cytoxan, BCNU. S/P right breast radiation in September 1995.  2. Left breast fibrocystic/glandular nodule  3. Hypothyroidism  4. Neurological issues, followed by neurology 5. Left, angle of the jaw thickening   PLAN:  With the patient's recent left sided sinusitis, and unimpressive left angle of the jaw thickening, we will perform a CA 27.29 today and she will return in 6 weeks for follow-up.  At this time, it is unimpressive and not worrisome or suspicious.   All questions were  answered. The patient knows to call the clinic with any problems, questions or concerns. We can certainly see the patient much sooner if necessary.  The patient and plan discussed with Glenford Peers, MD and he is in agreement with the aforementioned.  Benzion Mesta

## 2012-05-09 LAB — CANCER ANTIGEN 27.29: CA 27.29: 28 U/mL (ref 0–39)

## 2012-05-11 ENCOUNTER — Other Ambulatory Visit: Payer: Self-pay | Admitting: Internal Medicine

## 2012-05-12 ENCOUNTER — Other Ambulatory Visit: Payer: Self-pay | Admitting: Internal Medicine

## 2012-05-12 ENCOUNTER — Telehealth: Payer: Self-pay | Admitting: Internal Medicine

## 2012-05-12 NOTE — Telephone Encounter (Signed)
Rx to pharmacy/[facility] 

## 2012-05-12 NOTE — Telephone Encounter (Signed)
#  [id number] 

## 2012-05-12 NOTE — Telephone Encounter (Signed)
Refill- alprazolam 0.25mg  tablet. Take one tablet by mouth three times a day if needed. Last fill 10.8.13

## 2012-05-15 NOTE — Telephone Encounter (Signed)
Ok to rf same # [phone]

## 2012-05-15 NOTE — Telephone Encounter (Signed)
Rx phoned to pharmacy 11.15.13 #30x2; received & filled per Almira Coaster at pharmacy/[facility]

## 2012-05-16 MED ORDER — ALPRAZOLAM 0.25 MG PO TABS: 0.2500 mg | ORAL_TABLET | Freq: Three times a day (TID) | ORAL | Status: DC | PRN

## 2012-05-16 NOTE — Addendum Note (Signed)
Addended by: Regis Bill on: 05/16/2012 06:21 PM   Modules accepted: Orders

## 2012-05-16 NOTE — Telephone Encounter (Signed)
Per VO [facility], Ok #90; phoned in/[facility]

## 2012-06-20 ENCOUNTER — Ambulatory Visit (HOSPITAL_COMMUNITY): Payer: Medicare Other | Admitting: Oncology

## 2012-06-30 ENCOUNTER — Other Ambulatory Visit: Payer: Self-pay | Admitting: Internal Medicine

## 2012-06-30 NOTE — Telephone Encounter (Signed)
Alprazolam request [Last Rx 11.19.13 #90x0]/[facility] Please advise.

## 2012-06-30 NOTE — Telephone Encounter (Signed)
Rx phoned to pharmacy/[facility] 

## 2012-06-30 NOTE — Telephone Encounter (Signed)
ok 

## 2012-07-03 ENCOUNTER — Encounter (HOSPITAL_COMMUNITY): Payer: Medicare Other | Attending: Oncology | Admitting: Oncology

## 2012-07-03 ENCOUNTER — Encounter (HOSPITAL_COMMUNITY): Payer: Self-pay | Admitting: Oncology

## 2012-07-03 VITALS — BP 103/64 | HR 84 | Temp 97.5°F | Resp 16 | Wt 150.9 lb

## 2012-07-03 DIAGNOSIS — L723 Sebaceous cyst: Secondary | ICD-10-CM

## 2012-07-03 DIAGNOSIS — Z853 Personal history of malignant neoplasm of breast: Secondary | ICD-10-CM

## 2012-07-03 NOTE — Patient Instructions (Addendum)
.  [facility] Discharge Instructions  RECOMMENDATIONS MADE BY THE CONSULTANT AND ANY TEST RESULTS WILL BE SENT TO YOUR REFERRING PHYSICIAN.  EXAM FINDINGS BY THE PHYSICIAN TODAY AND SIGNS OR SYMPTOMS TO REPORT TO CLINIC OR PRIMARY PHYSICIAN: the area on your left neck is probably a cyst. No need to do any thing to it unless you want to show it to your dermatologist.   SPECIAL INSTRUCTIONS/FOLLOW-UP: Return in one year  Thank you for choosing [facility] to provide your oncology and hematology care.  To afford each patient quality time with our providers, please arrive at least 15 minutes before your scheduled appointment time.  With your help, our goal is to use those 15 minutes to complete the necessary work-up to ensure our physicians have the information they need to help with your evaluation and healthcare recommendations.    Effective January 1st, 2014, we ask that you re-schedule your appointment with our physicians should you arrive 10 or more minutes late for your appointment.  We strive to give you quality time with our providers, and arriving late affects you and other patients whose appointments are after yours.    Again, thank you for choosing [facility].  Our hope is that these requests will decrease the amount of time that you wait before being seen by our physicians.       _____________________________________________________________  I acknowledge that I have been informed and understand all the instructions given to me and received a copy. I do not have anymore questions at this time but understand that I may call the Cancer Center at [facility] at [phone] during business hours should I have any further questions or need assistance in obtaining follow-up care.

## 2012-07-03 NOTE — Progress Notes (Signed)
Problem number 1 history of breast cancer 1995 status post chemotherapy including bone marrow transplantation at [facility]. She had multiple positive nodes in the axilla but no primary was found in the right breast. She is here today for followup of a left sided submandibular nodule. She is doing well and is asymptomatic. She can feel this nodule she states just below the jaw line on the left.  She has no B. symptoms. She has no signs of infection anywhere.  Vital signs are stable. She has no lymphadenopathy that is pathological. There is a question of a small angle of the jaw node on either side that is 2 or 3 mm. She has clear lung fields. Both breasts are negative for any masses. Pectus excavatum is still obvious. Heart shows a regular rhythm and rate without murmur rub or gallop. Abdomen is soft and nontender without organomegaly. She has no leg edema no arm edema. What she does have however is a early sebaceous cyst the left high neck near the angle of the jaw which has now become very obvious as to what it is. She can have it seen by a dermatologist if she wants it removed or a surgeon but I do not think she needs to have anything done if she so desires. This cyst is approximately 2-3 mm across and about 2 mm deep. We have reassured her that it is absolutely benign. We will see her in one year.

## 2012-07-27 ENCOUNTER — Other Ambulatory Visit: Payer: Self-pay

## 2012-08-07 ENCOUNTER — Encounter: Payer: Self-pay | Admitting: Family Medicine

## 2012-08-07 ENCOUNTER — Ambulatory Visit (INDEPENDENT_AMBULATORY_CARE_PROVIDER_SITE_OTHER): Payer: Medicare Other | Admitting: Family Medicine

## 2012-08-07 VITALS — BP 120/80 | HR 103 | Temp 98.4°F | Ht 65.0 in | Wt 153.1 lb

## 2012-08-07 DIAGNOSIS — E039 Hypothyroidism, unspecified: Secondary | ICD-10-CM

## 2012-08-07 DIAGNOSIS — E785 Hyperlipidemia, unspecified: Secondary | ICD-10-CM

## 2012-08-07 DIAGNOSIS — F411 Generalized anxiety disorder: Secondary | ICD-10-CM

## 2012-08-07 DIAGNOSIS — F32A Depression, unspecified: Secondary | ICD-10-CM

## 2012-08-07 DIAGNOSIS — R5381 Other malaise: Secondary | ICD-10-CM

## 2012-08-07 DIAGNOSIS — F329 Major depressive disorder, single episode, unspecified: Secondary | ICD-10-CM

## 2012-08-07 DIAGNOSIS — K5900 Constipation, unspecified: Secondary | ICD-10-CM

## 2012-08-07 LAB — HEPATIC FUNCTION PANEL
ALT: 21 U/L (ref 0–35)
AST: 26 U/L (ref 0–37)
Albumin: 4.2 g/dL (ref 3.5–5.2)
Alkaline Phosphatase: 89 U/L (ref 39–117)
Bilirubin, Direct: 0.1 mg/dL (ref 0.0–0.3)
Indirect Bilirubin: 0.6 mg/dL (ref 0.0–0.9)
Total Bilirubin: 0.7 mg/dL (ref 0.3–1.2)
Total Protein: 6.6 g/dL (ref 6.0–8.3)

## 2012-08-07 LAB — T3, FREE: T3, Free: 2.8 pg/mL (ref 2.3–4.2)

## 2012-08-07 LAB — T4, FREE: Free T4: 1.33 ng/dL (ref 0.80–1.80)

## 2012-08-07 LAB — TSH: TSH: 1.649 u[IU]/mL (ref 0.350–4.500)

## 2012-08-07 MED ORDER — ESCITALOPRAM OXALATE 10 MG PO TABS: 10.0000 mg | ORAL_TABLET | Freq: Every day | ORAL | Status: DC

## 2012-08-07 MED ORDER — ALPRAZOLAM 0.25 MG PO TABS: 0.2500 mg | ORAL_TABLET | Freq: Three times a day (TID) | ORAL | Status: DC | PRN

## 2012-08-07 MED ORDER — LEVOTHYROXINE SODIUM 75 MCG PO TABS: 75.0000 ug | ORAL_TABLET | Freq: Every day | ORAL | Status: DC

## 2012-08-07 MED ORDER — BUPROPION HCL ER (XL) 150 MG PO TB24: 150.0000 mg | ORAL_TABLET | Freq: Every day | ORAL | Status: DC

## 2012-08-07 NOTE — Assessment & Plan Note (Addendum)
Will try decreasing Wellbutrin to 150 mg daily and start Lexapro 10 mg daily, given refill on her Alprazolam to use prn. Doing well but notes worsening startle reflex, nightmares, hypervigilance  Denies ever taking Zoloft and she has never had an anaphylactic reaction to anything

## 2012-08-07 NOTE — Assessment & Plan Note (Signed)
Mild, continue probiotics and fiber, consider adding Pericolace 1-2 caps daily

## 2012-08-07 NOTE — Assessment & Plan Note (Signed)
Mild, could consider low dose Crestor and CoQ10 will repeat testing before proceeding

## 2012-08-07 NOTE — Assessment & Plan Note (Signed)
Worsening fatigue and cold intolerance, will repeat thyroid studies to determine if she needs meds adjusted

## 2012-08-07 NOTE — Patient Instructions (Addendum)
Pericolace (Docusate and Senna) 1 or 2 caps daily Consider a switch from fish to [employer] caps (MegaRed) by Schiff  Especially if we restart a statin, such as Crestor will start Co Q10 daily   Post-Traumatic Stress Disorder If you have been diagnosed with post-traumatic stress disorder (PTSD), you have probably experienced a traumatic event in your life. These events are usually outside of the range of normal human experience and would negatively impact any normal person.  CAUSES  A person can get PTSD after living through or seeing a dangerous event such as:  An automobile accident.  War.  Natural disaster.  Rape.  Domestic violence.  Any event where there has been a threat to life. PTSD is a real illness. PTSD Can happen to anyone at any age. Children get PTSD too. A doctor, or mental health professional with experience in treating PTSD can help you. SYMPTOMS  Not all symptoms may be present in any one person.  Distressing dreams.  Flashback: feeling the frightening event is happening again.  Avoiding activities, places, and people that remind you of the event.  Avoiding thoughts and feelings associated with the event.  Having frightening thoughts you cannot control.  Feeling on the edge with increased alertness and vigilance.  Trouble sleeping.  Feeling alone, detached from others.  Angry outbursts.  Feeling worried, guilty, or sad.  Having thoughts of hurting yourself or others. PTSD may start soon after a frightening event or months or years later. Many war veterans have PTSD. Drinking alcohol or using drugs will not help PTSD and may even make it worse.  TREATMENT  PTSD can be treated. Treatment may include "talk" therapy, medicine, or both. Either a doctor or a mental health professional who is experienced in treating PTSD can help you. Early diagnosis and treatment is best and can show more rapid improvement. Get help if you or a loved one are thinking of  hurting yourself. Call your local emergency medical services if you need help immediately. Document Released: 03/09/2001 Document Revised: 09/06/2011 Document Reviewed: 02/21/2008 [facility] Patient Information 2013 [place], [place].

## 2012-08-07 NOTE — Progress Notes (Signed)
Patient ID: [name], female   DOB: 12-03-1955, 57 y.o.   MRN: [id number] [name] [id number] January 18, 1956 08/07/2012      Progress Note-Follow Up  Subjective  Chief Complaint  Chief Complaint  Patient presents with  . Follow-up    6 month    HPI  Patient is a 57 year old Caucasian female who is in today for routine followup. Unfortunately she has a past medical history including stage III breast cancer many years ago. At present she is struggling with postmenopausal bleeding as well as a lesion on her vulva. Her gynecologist is started her on antibiotic hoping  To lesion on her vulva will improved and she is scheduled for an ultrasound next week. She struggles with constipation moving her bowels with some difficulty or if you have days. This is not notably worse but has continued to bother her. No bloody stools noted. She recently had struggle with a lot of her chest and in with her new health stressors is finding herself more easily. Restart reflux an .d bad dreams are worsening as well. Throughout the years she feels she's handling her various diagnoses well technologist she feels overwhelmed at times. No other acute illness. No complaints of palpitations or shortness of breath. No headaches or fevers noted. Does struggle with chronic and daily neck and back pain he uses very infrequent doses of hydrocodone with good results when she needs them  Past Medical History  Diagnosis Date  . Allergic rhinitis   . Tremor   . Edema of lower extremity   . Neuropathy   . Dyspnea   . Hypothyroidism   . Anxiety   . Depression   . History of migraine headaches     only one in the past  . Breast cancer   . Nonischemic cardiomyopathy     echocardiogram done 6/12 demonstrated reduced LV function with an EF of 45-50% and grade 1 diastolic dysfunction  . CAD (coronary artery disease)     Nonobstructive by cath 7/12:  50% proximal LAD  . Congenital anomaly of superior vena cava     R Ht cath  7/12:  NL pressures, no pulmonary hypertension and normal wedge pressure; probable congenital anomaly with at least a left sided SVC going into the coronary sinus.  Right-sided sats were somewhat elevated, although they seem to be elevated uniformly from the PA, RA and RV.  consider bubble study from left arm or cardiac MRI or CT  . CHF (congestive heart failure)   . Dysrhythmia   . Pneumonia unknown  . H/O hiatal hernia     found during endoscopy in 2012  . Headache   . Arthritis     hip, knees, feet, ankles  . Depression with anxiety 12/28/2010    Past Surgical History  Procedure Laterality Date  . Bone marrow transplant  01/95  . Cervical conization w/bx    . Arthoscopy      rt. knee  . Axillary node dissection    . Bone marrow harvest    . Port-a-cath removed    . Hickman placed    . Bone marrow transplant    . Biopsy breast      L & R  . Combined hysteroscopy diagnostic / d&c    . Colonoscopy    . Cardiac catheterization    . Mastectomy, partial  1994    right    Family History  Problem Relation Age of Onset  . COPD Mother   .  Breast cancer Maternal Grandmother   . Tuberculosis Maternal Grandmother   . Stroke Maternal Grandmother   . Allergies Father   . Heart disease Father   . Stroke Father   . Heart attack Father   . Allergies Sister   . Deep vein thrombosis Sister     History   Social History  . Marital Status: Married    Spouse Name: N/A    Number of Children: N/A  . Years of Education: N/A   Occupational History  . homemaker    Social History Main Topics  . Smoking status: Never Smoker   . Smokeless tobacco: Never Used  . Alcohol Use: 0.6 oz/week    1 Glasses of wine per week     Comment: occasionally   . Drug Use: No  . Sexually Active: Yes    Birth Control/ Protection: Post-menopausal   Other Topics Concern  . Not on file   Social History Narrative   Lives in [place]   Has been married for 4 yerars.  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 furosemide (LASIX) 40 MG tablet Take 40 mg by mouth as needed.      Marland Kitchen HYDROcodone-acetaminophen (NORCO) 10-325 MG per tablet Take 1 tablet by mouth as needed. For pain.      . Nutritional Supplements (JUICE PLUS FIBRE PO) Take 2 each by mouth daily.      . traZODone (DESYREL) 50 MG tablet Take 50 mg by mouth at bedtime. Take 1/2 to at bedtime       No current facility-administered medications on file prior to visit.    Allergies  Allergen Reactions  . Codeine   . Cymbalta (Duloxetine Hcl)     Personality changes  . Lamictal (Lamotrigine)   . Morphine And Related   . Sertraline Hcl     Review of Systems  Review of Systems  Constitutional: Positive for malaise/fatigue. Negative for fever.  HENT: Positive for neck pain. Negative for congestion.   Eyes: Negative for discharge.  Respiratory: Negative for shortness of breath.   Cardiovascular: Negative for chest pain, palpitations and leg swelling.  Gastrointestinal: Positive for constipation. Negative for nausea, abdominal pain and diarrhea.  Genitourinary: Negative for dysuria.  Musculoskeletal: Positive for back pain. Negative for falls.  Skin: Negative for rash.  Neurological: Negative for loss of consciousness and headaches.  Endo/Heme/Allergies: Negative for polydipsia.  Psychiatric/Behavioral: Positive for depression. Negative for suicidal ideas. The patient is nervous/anxious. The patient does not have insomnia.     Objective  BP 120/80  Pulse 103  Temp(Src) 98.4 F (36.9 C) (Oral)  Ht 5\' 5"  (1.651 m)  Wt 153 lb 1.3 oz (69.437 kg)  BMI 25.47 kg/m2  SpO2 97%  Physical Exam  Physical Exam  Constitutional: She is oriented to person, place, and time and well-developed, well-nourished, and in no distress. No distress.  HENT:  Head: Normocephalic and atraumatic.  Eyes:  Conjunctivae are normal.  Neck: Neck supple. No thyromegaly present.  Cardiovascular: Normal rate, regular rhythm and normal heart sounds.   No murmur heard. Pulmonary/Chest: Effort normal and breath sounds normal. She has no wheezes.  Abdominal: She exhibits no distension and no mass.  Musculoskeletal: She exhibits no edema.  Lymphadenopathy:    She has no cervical adenopathy.  Neurological: She is alert and oriented to person, place, and  time.  Skin: Skin is warm and dry. No rash noted. She is not diaphoretic.  Psychiatric: Memory, affect and judgment normal.    Lab Results  Component Value Date   TSH 0.951 09/27/2011   Lab Results  Component Value Date   WBC 5.0 03/07/2012   HGB 13.0 03/07/2012   HCT 38.6 03/07/2012   MCV 96.0 03/07/2012   PLT 168 03/07/2012   Lab Results  Component Value Date   CREATININE 0.90 03/06/2012   BUN 20 03/06/2012   NA 139 03/06/2012   K 4.2 03/06/2012   CL 101 03/06/2012   CO2 28 03/06/2012   Lab Results  Component Value Date   ALT 37* 03/06/2012   AST 43* 03/06/2012   ALKPHOS 101 03/06/2012   BILITOT 0.3 03/06/2012   Lab Results  Component Value Date   CHOL 209* 09/27/2011   Lab Results  Component Value Date   HDL 50 09/27/2011   Lab Results  Component Value Date   LDLCALC 143* 09/27/2011   Lab Results  Component Value Date   TRIG 79 09/27/2011   Lab Results  Component Value Date   CHOLHDL 4.2 09/27/2011     Assessment & Plan  Depression with anxiety Will try decreasing Wellbutrin to 150 mg daily and start Lexapro 10 mg daily, given refill on her Alprazolam to use prn. Doing well but notes worsening startle reflex, nightmares, hypervigilance  Denies ever taking Zoloft and she has never had an anaphylactic reaction to anything  Hypothyroidism Worsening fatigue and cold intolerance, will repeat thyroid studies to determine if she needs meds adjusted  Hyperlipidemia Mild, could consider low dose Crestor and CoQ10 will repeat testing before  proceeding  Constipation Mild, continue probiotics and fiber, consider adding Pericolace 1-2 caps daily

## 2012-08-08 NOTE — Progress Notes (Signed)
Quick Note:  Patient Informed and voiced understanding ______ 

## 2012-08-10 ENCOUNTER — Other Ambulatory Visit: Payer: Self-pay | Admitting: Internal Medicine

## 2012-08-14 NOTE — Telephone Encounter (Signed)
Left detailed message on pharmacy voicemail to use alprazolam Rx on file from 08/07/12, #90 x 2 refills.

## 2012-08-17 ENCOUNTER — Encounter (HOSPITAL_COMMUNITY): Payer: Self-pay | Admitting: Pharmacist

## 2012-08-18 ENCOUNTER — Other Ambulatory Visit: Payer: Self-pay | Admitting: Obstetrics & Gynecology

## 2012-08-18 MED ORDER — HYDROMORPHONE HCL PF 1 MG/ML IJ SOLN
INTRAMUSCULAR | Status: AC
  Filled 2012-08-18: qty 1

## 2012-08-22 ENCOUNTER — Encounter (HOSPITAL_COMMUNITY): Payer: Self-pay

## 2012-08-22 ENCOUNTER — Encounter (HOSPITAL_COMMUNITY)
Admission: RE | Admit: 2012-08-22 | Discharge: 2012-08-22 | Disposition: A | Discharge status: Home / Self Care | Payer: Medicare Other | Source: Ambulatory Visit | Attending: Obstetrics & Gynecology | Admitting: Obstetrics & Gynecology

## 2012-08-22 LAB — BASIC METABOLIC PANEL
BUN: 26 mg/dL — ABNORMAL HIGH (ref 6–23)
CO2: 27 mEq/L (ref 19–32)
Calcium: 9.4 mg/dL (ref 8.4–10.5)
Chloride: 103 mEq/L (ref 96–112)
Creatinine, Ser: 0.84 mg/dL (ref 0.50–1.10)
GFR calc Af Amer: 89 mL/min — ABNORMAL LOW (ref >90)
GFR calc non Af Amer: 77 mL/min — ABNORMAL LOW (ref >90)
Glucose, Bld: 109 mg/dL — ABNORMAL HIGH (ref 70–99)
Potassium: 4.3 mEq/L (ref 3.5–5.1)
Sodium: 141 mEq/L (ref 135–145)

## 2012-08-22 LAB — CBC
HCT: 43.5 % (ref 36.0–46.0)
Hemoglobin: 14.6 g/dL (ref 12.0–15.0)
MCH: 32.2 pg (ref 26.0–34.0)
MCHC: 33.6 g/dL (ref 30.0–36.0)
MCV: 96 fL (ref 78.0–100.0)
Platelets: 225 10*3/uL (ref 150–400)
RBC: 4.53 MIL/uL (ref 3.87–5.11)
RDW: 12.7 % (ref 11.5–15.5)
WBC: 7.7 10*3/uL (ref 4.0–10.5)

## 2012-08-22 MED ORDER — CEFAZOLIN SODIUM-DEXTROSE 2-3 GM-% IV SOLR
2.0000 g | INTRAVENOUS | Status: AC
  Administered 2012-08-23: 2 g via INTRAVENOUS

## 2012-08-22 NOTE — H&P (Signed)
[name] is an 57 y.o. female here for hysteroscopy and endometrial curetting for postmenopausal bleeding after office endometrial biopsy failed due to cervical stenosis.   Patient has had an episode of postmenopausal bleeding last year as well, but her endometrial stripe then was thin. Now it measures at 6-7 mm on office sonogram and needs to r/o endometrial hyperplasia or cancer.  She was noted to have endometrial hyperplasia and polyp in 1999 on biopsy, that had resolved.   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  Several other medical problems as listed   No LMP recorded. Patient is postmenopausal.    Past Medical History  Diagnosis Date  . Allergic rhinitis   . Tremor   . Edema of lower extremity   . Neuropathy   . Hypothyroidism   . Anxiety   . Depression   . History of migraine headaches     only one in the past  . Breast cancer   . Nonischemic cardiomyopathy     echocardiogram done 6/12 demonstrated reduced LV function with an EF of 45-50% and grade 1 diastolic dysfunction  . Congenital anomaly of superior vena cava     R Ht cath 7/12:  NL pressures, no pulmonary hypertension and normal wedge pressure; probable congenital anomaly with at least a left sided SVC going into the coronary sinus.  Right-sided sats were somewhat elevated, although they seem to be elevated uniformly from the PA, RA and RV.  consider bubble study from left arm or cardiac MRI or CT  . H/O hiatal hernia     found during endoscopy in 2012  . Arthritis     hip, knees, feet, ankles  . Depression with anxiety 12/28/2010  . CAD (coronary artery disease)     Nonobstructive by cath 7/12:  50% proximal LAD  . Dyspnea     when over exerted  . Pneumonia unknown    Past Surgical History  Procedure Laterality Date  . Bone marrow transplant  01/95  . Cervical conization w/bx    . Arthoscopy      rt. knee  .  Axillary node dissection    . Bone marrow harvest    . Port-a-cath removed    . Hickman placed    . Bone marrow transplant    . Biopsy breast      L & R  . Combined hysteroscopy diagnostic / d&c    . Colonoscopy    . Cardiac catheterization    . Mastectomy, partial  1994    right    Family History  Problem Relation Age of Onset  . COPD Mother   . Breast cancer Maternal Grandmother   . Tuberculosis Maternal Grandmother   . Stroke Maternal Grandmother   . Allergies Father   . Heart disease Father   . Stroke Father   . Heart attack Father   . Allergies Sister   . Deep vein thrombosis Sister     Social History:  reports that she has never smoked. She has never used smokeless tobacco. She reports that she drinks about 0.6 ounces of alcohol per week. She reports that she does not use illicit drugs.  Allergies:  Allergies  Allergen Reactions  . Codeine   . Cymbalta (Duloxetine Hcl)     Personality changes  . Lamictal (Lamotrigine)   . Morphine And Related   . Sertraline Hcl  No prescriptions prior to admission    ROS ---> neg  Physical Exam There were no vitals taken for this visit.  A&O x 3, no acute distress. Pleasant HEENT neg, no thyromegaly Lungs CTA bilat CV RRR, S1S2 normal Abdo soft, non tender, non acute Extr no edema/ tenderness Pelvic -- cervical stenosis, normal uterus, no adnexal masses  Results for orders placed during the hospital encounter of 08/22/12 (from the past 24 hour(s))  CBC     Status: None   Collection Time    08/22/12  9:20 AM      Result Value Range   WBC 7.7  4.0 - 10.5 K/uL   RBC 4.53  3.87 - 5.11 MIL/uL   Hemoglobin 14.6  12.0 - 15.0 g/dL   HCT 16.1  09.6 - 04.5 %   MCV 96.0  78.0 - 100.0 fL   MCH 32.2  26.0 - 34.0 pg   MCHC 33.6  30.0 - 36.0 g/dL   RDW 40.9  81.1 - 91.4 %   Platelets 225  150 - 400 K/uL  BASIC METABOLIC PANEL     Status: Abnormal   Collection Time    08/22/12  9:20 AM      Result Value Range    Sodium 141  135 - 145 mEq/L   Potassium 4.3  3.5 - 5.1 mEq/L   Chloride 103  96 - 112 mEq/L   CO2 27  19 - 32 mEq/L   Glucose, Bld 109 (*) 70 - 99 mg/dL   BUN 26 (*) 6 - 23 mg/dL   Creatinine, Ser 7.82  0.50 - 1.10 mg/dL   Calcium 9.4  8.4 - 95.6 mg/dL   GFR calc non Af Amer 77 (*) >90 mL/min   GFR calc Af Amer 89 (*) >90 mL/min    No results found.  Assessment/Plan: Postmenopausal bleeding with thick endometrial stripe, needs to assess for endometrial cancer or hyperplasia. Here for hysteroscopic evaluation, possible biopsy and curettage.  Risks/complications of surgery reviewed incl infection, bleeding, damage to internal organs including uterus, cervix, internal organs, other risks from anesthesia, VTE and delayed complications of any surgery, complications in future surgery reviewed.   Delson Dulworth R 08/22/2012, 7:59 PM

## 2012-08-22 NOTE — Patient Instructions (Addendum)
Your procedure is scheduled on:08/23/12  Enter through the Main Entrance at :1030 am Pick up desk phone and dial [phone] and inform us of your arrival.  Please call [phone] if you have any problems the morning of surgery.  Remember: Do not eat after midnight:tonight Clear liquids ok until 8am Wed  Take these meds the morning of surgery with a sip of water: usual am meds  DO NOT wear jewelry, eye make-up, lipstick,body lotion, or dark fingernail polish. Do not shave for 48 hours prior to surgery.  Patients discharged on the day of surgery will not be allowed to drive home.

## 2012-08-23 ENCOUNTER — Encounter (HOSPITAL_COMMUNITY)
Admission: RE | Disposition: A | Discharge status: Home / Self Care | Payer: Self-pay | Source: Ambulatory Visit | Attending: Obstetrics & Gynecology

## 2012-08-23 ENCOUNTER — Ambulatory Visit (HOSPITAL_COMMUNITY)
Admission: RE | Admit: 2012-08-23 | Discharge: 2012-08-23 | Disposition: A | Discharge status: Home / Self Care | Payer: Medicare Other | Source: Ambulatory Visit | Attending: Obstetrics & Gynecology | Admitting: Obstetrics & Gynecology

## 2012-08-23 ENCOUNTER — Encounter (HOSPITAL_COMMUNITY): Payer: Self-pay | Admitting: Obstetrics & Gynecology

## 2012-08-23 ENCOUNTER — Ambulatory Visit (HOSPITAL_COMMUNITY): Payer: Medicare Other | Admitting: Anesthesiology

## 2012-08-23 ENCOUNTER — Encounter (HOSPITAL_COMMUNITY): Payer: Self-pay | Admitting: Anesthesiology

## 2012-08-23 DIAGNOSIS — G4700 Insomnia, unspecified: Secondary | ICD-10-CM

## 2012-08-23 DIAGNOSIS — R221 Localized swelling, mass and lump, neck: Secondary | ICD-10-CM

## 2012-08-23 DIAGNOSIS — N950 Postmenopausal bleeding: Secondary | ICD-10-CM | POA: Diagnosis present

## 2012-08-23 DIAGNOSIS — I2510 Atherosclerotic heart disease of native coronary artery without angina pectoris: Secondary | ICD-10-CM

## 2012-08-23 DIAGNOSIS — Z0000 Encounter for general adult medical examination without abnormal findings: Secondary | ICD-10-CM

## 2012-08-23 DIAGNOSIS — E785 Hyperlipidemia, unspecified: Secondary | ICD-10-CM

## 2012-08-23 DIAGNOSIS — Q249 Congenital malformation of heart, unspecified: Secondary | ICD-10-CM

## 2012-08-23 DIAGNOSIS — Z3049 Encounter for surveillance of other contraceptives: Secondary | ICD-10-CM | POA: Insufficient documentation

## 2012-08-23 DIAGNOSIS — L719 Rosacea, unspecified: Secondary | ICD-10-CM

## 2012-08-23 DIAGNOSIS — Z853 Personal history of malignant neoplasm of breast: Secondary | ICD-10-CM

## 2012-08-23 DIAGNOSIS — E039 Hypothyroidism, unspecified: Secondary | ICD-10-CM

## 2012-08-23 DIAGNOSIS — F418 Other specified anxiety disorders: Secondary | ICD-10-CM

## 2012-08-23 DIAGNOSIS — J309 Allergic rhinitis, unspecified: Secondary | ICD-10-CM

## 2012-08-23 DIAGNOSIS — R0602 Shortness of breath: Secondary | ICD-10-CM

## 2012-08-23 DIAGNOSIS — Z9481 Bone marrow transplant status: Secondary | ICD-10-CM

## 2012-08-23 DIAGNOSIS — I428 Other cardiomyopathies: Secondary | ICD-10-CM

## 2012-08-23 DIAGNOSIS — G629 Polyneuropathy, unspecified: Secondary | ICD-10-CM

## 2012-08-23 DIAGNOSIS — F411 Generalized anxiety disorder: Secondary | ICD-10-CM

## 2012-08-23 DIAGNOSIS — M542 Cervicalgia: Secondary | ICD-10-CM

## 2012-08-23 DIAGNOSIS — J329 Chronic sinusitis, unspecified: Secondary | ICD-10-CM

## 2012-08-23 DIAGNOSIS — R9389 Abnormal findings on diagnostic imaging of other specified body structures: Secondary | ICD-10-CM | POA: Insufficient documentation

## 2012-08-23 HISTORY — PX: HYSTEROSCOPY WITH D & C: SHX1775

## 2012-08-23 SURGERY — DILATATION AND CURETTAGE /HYSTEROSCOPY
Anesthesia: Monitor Anesthesia Care | Site: Vagina | Wound class: Clean Contaminated

## 2012-08-23 MED ORDER — CEFAZOLIN SODIUM-DEXTROSE 2-3 GM-% IV SOLR
INTRAVENOUS | Status: AC
  Filled 2012-08-23: qty 50

## 2012-08-23 MED ORDER — MEPERIDINE HCL 25 MG/ML IJ SOLN: 6.2500 mg | INTRAMUSCULAR | Status: DC | PRN

## 2012-08-23 MED ORDER — MIDAZOLAM HCL 5 MG/5ML IJ SOLN
INTRAMUSCULAR | Status: DC | PRN
  Administered 2012-08-23 (×2): 1 mg via INTRAVENOUS

## 2012-08-23 MED ORDER — FENTANYL CITRATE 0.05 MG/ML IJ SOLN
INTRAMUSCULAR | Status: AC
  Administered 2012-08-23: 50 ug via INTRAVENOUS
  Filled 2012-08-23: qty 2

## 2012-08-23 MED ORDER — MIDAZOLAM HCL 2 MG/2ML IJ SOLN
INTRAMUSCULAR | Status: AC
  Filled 2012-08-23: qty 2

## 2012-08-23 MED ORDER — FENTANYL CITRATE 0.05 MG/ML IJ SOLN: 25.0000 ug | INTRAMUSCULAR | Status: DC | PRN

## 2012-08-23 MED ORDER — IBUPROFEN 200 MG PO TABS: 600.0000 mg | ORAL_TABLET | Freq: Three times a day (TID) | ORAL | Status: DC | PRN

## 2012-08-23 MED ORDER — DEXAMETHASONE SODIUM PHOSPHATE 4 MG/ML IJ SOLN: INTRAMUSCULAR | Status: DC | PRN

## 2012-08-23 MED ORDER — MISOPROSTOL 200 MCG PO TABS: 400.0000 ug | ORAL_TABLET | Freq: Once | ORAL | Status: AC

## 2012-08-23 MED ORDER — LIDOCAINE HCL (CARDIAC) 20 MG/ML IV SOLN
INTRAVENOUS | Status: AC
  Filled 2012-08-23: qty 5

## 2012-08-23 MED ORDER — ONDANSETRON HCL 4 MG/2ML IJ SOLN
INTRAMUSCULAR | Status: AC
  Filled 2012-08-23: qty 2

## 2012-08-23 MED ORDER — KETOROLAC TROMETHAMINE 30 MG/ML IJ SOLN
INTRAMUSCULAR | Status: AC
  Filled 2012-08-23: qty 1

## 2012-08-23 MED ORDER — MISOPROSTOL 200 MCG PO TABS
ORAL_TABLET | ORAL | Status: AC
  Administered 2012-08-23: 400 ug via ORAL
  Filled 2012-08-23: qty 2

## 2012-08-23 MED ORDER — FENTANYL CITRATE 0.05 MG/ML IJ SOLN
INTRAMUSCULAR | Status: AC
  Filled 2012-08-23: qty 5

## 2012-08-23 MED ORDER — FENTANYL CITRATE 0.05 MG/ML IJ SOLN
INTRAMUSCULAR | Status: DC | PRN
Start: 2012-08-23 — End: 2012-08-23
  Administered 2012-08-23 (×4): 50 ug via INTRAVENOUS

## 2012-08-23 MED ORDER — ONDANSETRON HCL 4 MG/2ML IJ SOLN
INTRAMUSCULAR | Status: DC | PRN
  Administered 2012-08-23: 4 mg via INTRAVENOUS

## 2012-08-23 MED ORDER — PROPOFOL 10 MG/ML IV EMUL
INTRAVENOUS | Status: AC
  Filled 2012-08-23: qty 20

## 2012-08-23 MED ORDER — PROPOFOL 10 MG/ML IV EMUL
INTRAVENOUS | Status: DC | PRN
  Administered 2012-08-23: 20 mg via INTRAVENOUS
  Administered 2012-08-23 (×2): 30 mg via INTRAVENOUS
  Administered 2012-08-23: 20 mg via INTRAVENOUS
  Administered 2012-08-23: 30 mg via INTRAVENOUS
  Administered 2012-08-23: 50 mg via INTRAVENOUS
  Administered 2012-08-23: 40 mg via INTRAVENOUS

## 2012-08-23 MED ORDER — LIDOCAINE HCL (CARDIAC) 20 MG/ML IV SOLN
INTRAVENOUS | Status: DC | PRN
  Administered 2012-08-23: 40 mg via INTRAVENOUS

## 2012-08-23 MED ORDER — SODIUM CHLORIDE 0.9 % IR SOLN
Status: DC | PRN
  Administered 2012-08-23: 3000 mL

## 2012-08-23 MED ORDER — LACTATED RINGERS IV SOLN
INTRAVENOUS | Status: DC
  Administered 2012-08-23 (×2): via INTRAVENOUS

## 2012-08-23 MED ORDER — LIDOCAINE HCL 1 % IJ SOLN
INTRAMUSCULAR | Status: DC | PRN
  Administered 2012-08-23: 20 mL

## 2012-08-23 MED ORDER — KETOROLAC TROMETHAMINE 30 MG/ML IJ SOLN
INTRAMUSCULAR | Status: DC | PRN
  Administered 2012-08-23: 30 mg via INTRAVENOUS

## 2012-08-23 MED ORDER — ONDANSETRON HCL 4 MG/2ML IJ SOLN: 4.0000 mg | Freq: Once | INTRAMUSCULAR | Status: DC | PRN

## 2012-08-23 MED ORDER — KETOROLAC TROMETHAMINE 30 MG/ML IJ SOLN: 15.0000 mg | Freq: Once | INTRAMUSCULAR | Status: DC | PRN

## 2012-08-23 SURGICAL SUPPLY — 18 items
CANISTER SUCTION 2500CC (MISCELLANEOUS) ×4 IMPLANT
CATH ROBINSON RED A/P 16FR (CATHETERS) ×4 IMPLANT
CLOTH BEACON ORANGE TIMEOUT ST (SAFETY) ×4 IMPLANT
CONTAINER PREFILL 10% NBF 60ML (FORM) ×8 IMPLANT
DRESSING TELFA 8X3 (GAUZE/BANDAGES/DRESSINGS) ×4 IMPLANT
ELECT REM PT RETURN 9FT ADLT (ELECTROSURGICAL) ×4
ELECTRODE REM PT RTRN 9FT ADLT (ELECTROSURGICAL) ×3 IMPLANT
ELECTRODE ROLLER VERSAPOINT (ELECTRODE) IMPLANT
ELECTRODE RT ANGLE VERSAPOINT (CUTTING LOOP) IMPLANT
GLOVE BIO SURGEON STRL SZ7 (GLOVE) ×4 IMPLANT
GLOVE BIOGEL PI IND STRL 7.0 (GLOVE) ×3 IMPLANT
GLOVE BIOGEL PI INDICATOR 7.0 (GLOVE) ×1
GOWN STRL REIN XL XLG (GOWN DISPOSABLE) ×8 IMPLANT
LOOP ANGLED CUTTING 22FR (CUTTING LOOP) IMPLANT
PACK HYSTEROSCOPY LF (CUSTOM PROCEDURE TRAY) ×4 IMPLANT
PAD OB MATERNITY 4.3X12.25 (PERSONAL CARE ITEMS) ×4 IMPLANT
TOWEL OR 17X24 6PK STRL BLUE (TOWEL DISPOSABLE) ×8 IMPLANT
WATER STERILE IRR 1000ML POUR (IV SOLUTION) ×4 IMPLANT

## 2012-08-23 NOTE — Op Note (Signed)
Preoperative diagnosis: Postmenopausal bleeding, thick endometrial stripe  Postop diagnosis: Same and Expelled Essure coil in endometrial cavity Procedure: Diagnostic Hysteroscopy, removal of essure coil and D&C Anesthesia: General, paracervical block Surgeon: Shea Evans, MD  Assistant: none IV fluids: 1400 cc LR Estimated blood loss: minimal  Urine output: straight catheter preop 30 cc Complications: none  Condition: stable  Disposition: PACU  Specimen: Endometrial curettings (minimal tissue)  Procedure  Indication: 57 yo, with postmenopausal bleeding, thick endometrial stripe at 6-7 mm and not on HRT. Remote breast cancer history and Tamoxifen use. Cervical stenosis and failed endometrial biopsy attempt in office.   Patient was counseled on risks/ complications including infection, bleeding, damage to internal organs, she understood and agrees, gave informed written consent. Patient was given pre-op Cytotec to aid cervical dilatation.  Patient was brought to the operating room with IV running. Time out was carried out. She received preop 1 gm Ancef. She underwent general anesthesia without complications. She was given dorsolithotomy position. Parts were prepped and draped in standard fashion. Bladder drained. Bimanual exam noted anteverted small uterus. Speculum was placed and cervix was grasped with single-tooth tenaculum. Cervical block with 13 cc 1% plain Xylocaine given. The uterus was sounded to 7 cm. Cervical os was dilated to 15 [place] dilator. Diagnostic hysteroscope was introduced in the uterine cavity under vision, using saline for irrigation.  Findings: Metal coils noted in the cavity with reactive endometrial tissue. Note patient with Essure sterilization in past.   Hysteroscopic Essure coil removed by grasping with hysteroscopic polyp grasper. Endometrial curettage performed but minimal tissue obtained and sent to path. Hysteroscope reintroduced, no other foreign material noted  and cavity appeared normal. Instruments removed.  Fluid deficit 115 cc.  All counts are correct x2. No complications. Patient brought to the recovery room in stable condition.  Discharged home today. Follow up in 2 weeks in office. Warning signs of infection and excessive bleeding reviewed.   V.Doreena Maulden, MD.

## 2012-08-23 NOTE — Anesthesia Postprocedure Evaluation (Signed)
Anesthesia Post Note  Patient: [name]  Procedure(s) Performed: Procedure(s) (LRB): DILATATION AND CURETTAGE /HYSTEROSCOPY (N/A)  Anesthesia type: MAC  Patient location: PACU  Post pain: Pain level controlled  Post assessment: Post-op Vital signs reviewed  Last Vitals:  Filed Vitals:   08/23/12 1020  BP: 116/74  Pulse: 88  Temp: 37.1 C  Resp: 18    Post vital signs: Reviewed  Level of consciousness: sedated  Complications: No apparent anesthesia complications

## 2012-08-23 NOTE — OR Nursing (Signed)
Dilation and curettage/hysteroscopy with removal of expelled essure coil performed.

## 2012-08-23 NOTE — Discharge Instructions (Signed)

## 2012-08-23 NOTE — Transfer of Care (Signed)
Immediate Anesthesia Transfer of Care Note  Patient: [name]  Procedure(s) Performed: Procedure(s) with comments: DILATATION AND CURETTAGE /HYSTEROSCOPY (N/A) - Removal of expelled essure coil  Patient Location: PACU  Anesthesia Type:MAC  Level of Consciousness: awake, alert , oriented and patient cooperative  Airway & Oxygen Therapy: Patient Spontanous Breathing  Post-op Assessment: Report given to PACU RN and Post -op Vital signs reviewed and stable  Post vital signs: Reviewed and stable  Complications: No apparent anesthesia complications

## 2012-08-23 NOTE — OR Nursing (Signed)
Pt had no urge to void, instructed to call md if unable to void when at home

## 2012-08-23 NOTE — Anesthesia Preprocedure Evaluation (Signed)
Anesthesia Evaluation  Patient identified by MRN, date of birth, ID band Patient awake    Reviewed: Allergy & Precautions, H&P , NPO status , Patient's Chart, lab work & pertinent test results  Airway Mallampati: I TM Distance: >3 FB Neck ROM: full    Dental no notable dental hx. (+) Teeth Intact   Pulmonary    Pulmonary exam normal       Cardiovascular + CAD  Non obstructive. Pt requires no medication. Heart issue due to chemo for breast Ca.   Neuro/Psych    GI/Hepatic Neg liver ROS,   Endo/Other  Hypothyroidism   Renal/GU negative Renal ROS  negative genitourinary   Musculoskeletal negative musculoskeletal ROS (+)   Abdominal Normal abdominal exam  (+)   Peds negative pediatric ROS (+)  Hematology negative hematology ROS (+)   Anesthesia Other Findings   Reproductive/Obstetrics negative OB ROS                           Anesthesia Physical Anesthesia Plan  ASA: II  Anesthesia Plan: MAC   Post-op Pain Management:    Induction: Intravenous  Airway Management Planned:   Additional Equipment:   Intra-op Plan:   Post-operative Plan:   Informed Consent: I have reviewed the patients History and Physical, chart, labs and discussed the procedure including the risks, benefits and alternatives for the proposed anesthesia with the patient or authorized representative who has indicated his/her understanding and acceptance.     Plan Discussed with: CRNA and Surgeon  Anesthesia Plan Comments: (1. Pt is accepting of LMA if necessary.)        Anesthesia Quick Evaluation

## 2012-08-24 ENCOUNTER — Encounter (HOSPITAL_COMMUNITY): Payer: Self-pay | Admitting: Obstetrics & Gynecology

## 2012-09-05 ENCOUNTER — Telehealth: Payer: Self-pay

## 2012-09-05 NOTE — Telephone Encounter (Signed)
Pt informed. Pt is not at home so she will call back to schedule appt

## 2012-09-05 NOTE — Telephone Encounter (Signed)
Patient left a message stating that Lexapro is causing Nausea, constipation, trouble sleeping, tired, increase in BP and pulse, tremors in hands increased, involuntary movement in fingers? Please advise?

## 2012-09-05 NOTE — Telephone Encounter (Signed)
Should go ahead and stop and come in so we can check vitals and pick a new medication in next week

## 2012-09-11 ENCOUNTER — Ambulatory Visit: Payer: Medicare Other | Admitting: Family Medicine

## 2012-10-02 ENCOUNTER — Other Ambulatory Visit: Payer: Self-pay | Admitting: Family Medicine

## 2012-10-02 ENCOUNTER — Ambulatory Visit (INDEPENDENT_AMBULATORY_CARE_PROVIDER_SITE_OTHER): Payer: Medicare Other | Admitting: Family Medicine

## 2012-10-02 ENCOUNTER — Encounter: Payer: Self-pay | Admitting: Family Medicine

## 2012-10-02 VITALS — BP 118/86 | HR 89 | Temp 98.3°F | Ht 65.0 in | Wt 160.0 lb

## 2012-10-02 DIAGNOSIS — E039 Hypothyroidism, unspecified: Secondary | ICD-10-CM

## 2012-10-02 DIAGNOSIS — N950 Postmenopausal bleeding: Secondary | ICD-10-CM

## 2012-10-02 DIAGNOSIS — F341 Dysthymic disorder: Secondary | ICD-10-CM

## 2012-10-02 DIAGNOSIS — F418 Other specified anxiety disorders: Secondary | ICD-10-CM

## 2012-10-02 DIAGNOSIS — K219 Gastro-esophageal reflux disease without esophagitis: Secondary | ICD-10-CM

## 2012-10-02 DIAGNOSIS — R109 Unspecified abdominal pain: Secondary | ICD-10-CM

## 2012-10-02 MED ORDER — ONDANSETRON 8 MG PO TBDP: 8.0000 mg | ORAL_TABLET | Freq: Three times a day (TID) | ORAL | Status: DC | PRN

## 2012-10-02 MED ORDER — RANITIDINE HCL 300 MG PO TABS: 300.0000 mg | ORAL_TABLET | Freq: Every day | ORAL | Status: DC

## 2012-10-02 MED ORDER — ALPRAZOLAM 0.5 MG PO TABS: 0.5000 mg | ORAL_TABLET | Freq: Three times a day (TID) | ORAL | Status: DC | PRN

## 2012-10-02 MED ORDER — BUPROPION HCL ER (XL) 300 MG PO TB24: 300.0000 mg | ORAL_TABLET | Freq: Every day | ORAL | Status: DC

## 2012-10-02 NOTE — Patient Instructions (Addendum)
Grief Reaction  Grief is a normal response to the death of someone close to you. Feelings of fear, anger, and guilt can affect almost everyone who loses someone they love. Symptoms of depression are also common. These include problems with sleep, loss of appetite, and lack of energy. These grief reaction symptoms often last for weeks to months after a loss. They may also return during special times that remind you of the person you lost, such as an anniversary or birthday.  Anxiety, insomnia, irritability, and deep depression may last beyond the period of normal grief. If you experience these feelings for 6 months or longer, you may have clinical depression. Clinical depression requires further medical attention. If you think that you have clinical depression, you should contact your caregiver. If you have a history of depression and or a family history of depression, you are at greater risk of clinical depression. You are also at greater risk of developing clinical depression if the loss was traumatic or the loss was of someone with whom you had unresolved issues.   A grief reaction can become complicated by being blocked. This means being unable to cry or express extreme emotions. This may prolong the grieving period and worsen the emotional effects of the loss. Mourning is a natural event in human life. A healthy grief reaction is one that is not blocked . It requires a time of sadness and readjustment.It is very important to share your sorrow and fear with others, especially close friends and family. Professional counselors and clergy can also help you process your grief.  Document Released: 06/14/2005 Document Revised: 09/06/2011 Document Reviewed: 02/22/2006  [employer] Patient Information 2013 [employer]

## 2012-10-03 LAB — URINALYSIS
Bilirubin Urine: NEGATIVE
Glucose, UA: NEGATIVE mg/dL
Hgb urine dipstick: NEGATIVE
Ketones, ur: NEGATIVE mg/dL
Leukocytes, UA: NEGATIVE
Nitrite: NEGATIVE
Protein, ur: NEGATIVE mg/dL
Specific Gravity, Urine: 1.01 (ref 1.005–1.030)
Urobilinogen, UA: 0.2 mg/dL (ref 0.0–1.0)
pH: 5.5 (ref 5.0–8.0)

## 2012-10-03 LAB — HEPATIC FUNCTION PANEL
ALT: 21 U/L (ref 0–35)
AST: 26 U/L (ref 0–37)
Albumin: 4.1 g/dL (ref 3.5–5.2)
Alkaline Phosphatase: 75 U/L (ref 39–117)
Bilirubin, Direct: 0.1 mg/dL (ref 0.0–0.3)
Indirect Bilirubin: 0.3 mg/dL (ref 0.0–0.9)
Total Bilirubin: 0.4 mg/dL (ref 0.3–1.2)
Total Protein: 6.3 g/dL (ref 6.0–8.3)

## 2012-10-03 LAB — CBC
HCT: 41.8 % (ref 36.0–46.0)
HCT: 41.8 % (ref 36.0–46.0)
Hemoglobin: 14.2 g/dL (ref 12.0–15.0)
Hemoglobin: 14.2 g/dL (ref 12.0–15.0)
MCH: 32.3 pg (ref 26.0–34.0)
MCH: 32.3 pg (ref 26.0–34.0)
MCHC: 34 g/dL (ref 30.0–36.0)
MCHC: 34 g/dL (ref 30.0–36.0)
MCV: 95 fL (ref 78.0–100.0)
MCV: 95.2 fL (ref 78.0–100.0)
Platelets: 235 10*3/uL (ref 150–400)
Platelets: 251 10*3/uL (ref 150–400)
RBC: 4.39 MIL/uL (ref 3.87–5.11)
RBC: 4.4 MIL/uL (ref 3.87–5.11)
RDW: 13.9 % (ref 11.5–15.5)
RDW: 14.1 % (ref 11.5–15.5)
WBC: 7 10*3/uL (ref 4.0–10.5)
WBC: 7.1 10*3/uL (ref 4.0–10.5)

## 2012-10-03 LAB — BASIC METABOLIC PANEL
BUN: 17 mg/dL (ref 6–23)
CO2: 26 mEq/L (ref 19–32)
Calcium: 9 mg/dL (ref 8.4–10.5)
Chloride: 106 mEq/L (ref 96–112)
Creat: 0.86 mg/dL (ref 0.50–1.10)
Glucose, Bld: 92 mg/dL (ref 70–99)
Potassium: 4.1 mEq/L (ref 3.5–5.3)
Sodium: 138 mEq/L (ref 135–145)

## 2012-10-03 LAB — SEDIMENTATION RATE: Sed Rate: 4 mm/hr (ref 0–22)

## 2012-10-03 LAB — PHOSPHORUS: Phosphorus: 2.8 mg/dL (ref 2.3–4.6)

## 2012-10-04 ENCOUNTER — Encounter: Payer: Self-pay | Admitting: Family Medicine

## 2012-10-04 DIAGNOSIS — K219 Gastro-esophageal reflux disease without esophagitis: Secondary | ICD-10-CM | POA: Insufficient documentation

## 2012-10-04 NOTE — Assessment & Plan Note (Signed)
Has just undergone hysteroscopy with d & c, following with GYN

## 2012-10-04 NOTE — Assessment & Plan Note (Signed)
Well treated with Levothyroxine

## 2012-10-04 NOTE — Assessment & Plan Note (Addendum)
Did not tolerate Lexapro. Will increase the Wellbutrin XL 300 mg daily. May use Alprazolam prn

## 2012-10-04 NOTE — Progress Notes (Signed)
Patient ID: [name], female   DOB: [birth year], 57 y.o.   MRN: [id number] [name] [id number] 1956/04/28 10/04/2012      Progress Note-Follow Up  Subjective  Chief Complaint  Chief Complaint  Patient presents with  . Follow-up    8 week    HPI  This is a 57 year old female who is in today for followup. She's recently undergone hysteroscopy with D&C for postmenopausal bleeding soon with GYN. He did find that her coils from her previous tubal had come loose and embedded in her uterus. She continues to have some mild brownish discharge but her pleasure concern is soft his persistent nausea without vomiting and some low back pain. She also has heartburn and poor appetite. No fevers but some mild constipation as noted some epigastric discomfort is also noted. No dysuria or hematuria. She has a persistent pelvic pain which is unchanged with urination or with position changes. She stopped Lexapro as it did not help. No chest pain, palpitations, shortness of, GI or GU complaints.  Past Medical History  Diagnosis Date  . Allergic rhinitis   . Tremor   . Edema of lower extremity   . Neuropathy   . Hypothyroidism   . Anxiety   . Depression   . History of migraine headaches     only one in the past  . Breast cancer   . Nonischemic cardiomyopathy     echocardiogram done 6/12 demonstrated reduced LV function with an EF of 45-50% and grade 1 diastolic dysfunction  . Congenital anomaly of superior vena cava     R Ht cath 7/12:  NL pressures, no pulmonary hypertension and normal wedge pressure; probable congenital anomaly with at least a left sided SVC going into the coronary sinus.  Right-sided sats were somewhat elevated, although they seem to be elevated uniformly from the PA, RA and RV.  consider bubble study from left arm or cardiac MRI or CT  . H/O hiatal hernia     found during endoscopy in 2012  . Arthritis     hip, knees, feet, ankles  . Depression with anxiety 12/28/2010  . CAD  (coronary artery disease)     Nonobstructive by cath 7/12:  50% proximal LAD  . Dyspnea     when over exerted  . Pneumonia unknown  . Postmenopausal bleeding 08/23/2012  . Esophageal reflux 10/04/2012    Past Surgical History  Procedure Laterality Date  . Bone marrow transplant  01/95  . Cervical conization w/bx    . Arthoscopy      rt. knee  . Axillary node dissection    . Bone marrow harvest    . Port-a-cath removed    . Hickman placed    . Bone marrow transplant    . Biopsy breast      L & R  . Combined hysteroscopy diagnostic / d&c    . Colonoscopy    . Cardiac catheterization    . Mastectomy, partial  1994    right  . Hysteroscopy w/d&c N/A 08/23/2012    Procedure: DILATATION AND CURETTAGE /HYSTEROSCOPY;  Surgeon: Robley Fries, MD;  Location: [facility];  Service: Gynecology;  Laterality: N/A;  Removal of expelled essure coil    Family History  Problem Relation Age of Onset  . COPD Mother   . Breast cancer Maternal Grandmother   . Tuberculosis Maternal Grandmother   . Stroke Maternal Grandmother   . Allergies Father   . Heart disease Father   .  Stroke Father   . Heart attack Father   . Allergies Sister   . Deep vein thrombosis Sister     History   Social History  . Marital Status: Married    Spouse Name: N/A    Number of Children: N/A  . Years of Education: N/A   Occupational History  . homemaker    Social History Main Topics  . Smoking status: Never Smoker   . Smokeless tobacco: Never Used  . Alcohol Use: 0.6 oz/week    1 Glasses of wine per week     Comment: occasionally   . Drug Use: No  . Sexually Active: Yes    Birth Control/ Protection: Post-menopausal   Other Topics Concern  . Not on file   Social History Narrative   Lives in [place]   Has been married for 4 yerars.  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 furosemide (LASIX) 40 MG tablet Take 40 mg by mouth daily as needed (fluid retention).       Marland Kitchen HYDROcodone-acetaminophen (NORCO) 10-325 MG per tablet Take 1 tablet by mouth every 6 (six) hours as needed for pain. For pain.      Marland Kitchen ibuprofen (MOTRIN IB) 200 MG tablet Take 3 tablets (600 mg total) by mouth every 8 (eight) hours as needed for pain.  20 tablet  0  . levothyroxine (SYNTHROID, LEVOTHROID) 75 MCG tablet Take 1 tablet (75 mcg total) by mouth daily.  30 tablet  3  . traZODone (DESYREL) 50 MG tablet Take 50 mg by mouth at bedtime.        No current facility-administered medications on file prior to visit.    Allergies  Allergen Reactions  . Codeine   . Cymbalta (Duloxetine Hcl)     Personality changes  . Lamictal (Lamotrigine)   . Morphine And Related   . Sertraline Hcl     Review of Systems  Review of Systems  Constitutional: Negative for fever and malaise/fatigue.  HENT: Negative for congestion.   Eyes: Negative for discharge.  Respiratory: Negative for shortness of breath.   Cardiovascular: Negative for chest pain, palpitations and leg swelling.  Gastrointestinal: Positive for heartburn, nausea, abdominal pain and constipation. Negative for diarrhea, blood in stool and melena.  Genitourinary: Negative for dysuria.  Musculoskeletal: Negative for falls.  Skin: Negative for rash.  Neurological: Negative for loss of consciousness and headaches.  Endo/Heme/Allergies: Negative for polydipsia.  Psychiatric/Behavioral: Negative for depression and suicidal ideas. The patient is not nervous/anxious and does not have insomnia.     Objective  BP 118/86  Pulse 89  Temp(Src) 98.3 F (36.8 C) (Oral)  Ht 5\' 5"  (1.651 m)  Wt 160 lb 0.6 oz (72.594 kg)  BMI 26.63 kg/m2  SpO2 95%  Physical Exam  Physical Exam  Constitutional: She is oriented to person, place, and time and well-developed, well-nourished, and in no distress. No distress.  HENT:  Head: Normocephalic  and atraumatic.  Eyes: Conjunctivae are normal.  Neck: Neck supple. No thyromegaly present.  Cardiovascular: Normal rate, regular rhythm and normal heart sounds.   No murmur heard. Pulmonary/Chest: Effort normal and breath sounds normal. She has no wheezes.  Abdominal: She exhibits no distension and no mass.  Musculoskeletal: She exhibits no edema.  Lymphadenopathy:    She has no cervical adenopathy.  Neurological: She is alert and  oriented to person, place, and time.  Skin: Skin is warm and dry. No rash noted. She is not diaphoretic.  Psychiatric: Memory, affect and judgment normal.    Lab Results  Component Value Date   TSH 1.649 08/07/2012   Lab Results  Component Value Date   WBC 7.0 10/02/2012   HGB 14.2 10/02/2012   HCT 41.8 10/02/2012   MCV 95.0 10/02/2012   PLT 235 10/02/2012   Lab Results  Component Value Date   CREATININE 0.86 10/02/2012   BUN 17 10/02/2012   NA 138 10/02/2012   K 4.1 10/02/2012   CL 106 10/02/2012   CO2 26 10/02/2012   Lab Results  Component Value Date   ALT 21 10/02/2012   AST 26 10/02/2012   ALKPHOS 75 10/02/2012   BILITOT 0.4 10/02/2012   Lab Results  Component Value Date   CHOL 209* 09/27/2011   Lab Results  Component Value Date   HDL 50 09/27/2011   Lab Results  Component Value Date   LDLCALC 143* 09/27/2011   Lab Results  Component Value Date   TRIG 79 09/27/2011   Lab Results  Component Value Date   CHOLHDL 4.2 09/27/2011     Assessment & Plan  Depression with anxiety Did not tolerate Lexapro. Will increase the Wellbutrin XL 300 mg daily. May use Alprazolam prn  Postmenopausal bleeding Has just undergone hysteroscopy with d & c, following with GYN  Esophageal reflux Start Ranitidine 300 mg daily and avoid offending foods, start a probiotic and given zofran to use prn for nausea  Hypothyroidism Well treated with Levothyroxine

## 2012-10-04 NOTE — Assessment & Plan Note (Signed)
Start Ranitidine 300 mg daily and avoid offending foods, start a probiotic and given zofran to use prn for nausea

## 2012-10-05 ENCOUNTER — Telehealth: Payer: Self-pay | Admitting: *Deleted

## 2012-10-05 LAB — URINE CULTURE: Colony Count: 70000

## 2012-10-05 MED ORDER — CIPROFLOXACIN HCL 250 MG PO TABS: 250.0000 mg | ORAL_TABLET | Freq: Two times a day (BID) | ORAL | Status: DC

## 2012-10-05 NOTE — Telephone Encounter (Signed)
Message copied by Regis Bill on Thu Oct 05, 2012  4:40 PM ------      Message from: Danise Edge A      Created: Tue Oct 03, 2012  9:06 AM       Notify labs normal, urine culture still pending ------

## 2012-10-05 NOTE — Telephone Encounter (Signed)
Notes Recorded by Regis Bill, CMA on 10/05/2012 at 12:39 PM [facility] with contact name and number for return call RE: results and further provider instructions/SLS  Notes Recorded by Bradd Canary, MD on 10/05/2012 at 8:08 AM Notify positive uti, needs Ciprofloxacin 250 mg po bid x 5 days  Patient informed, understood & agreed; new Rx to pharmacy. Pt states that she was put on Uribel by GYN and discussed at OV if no change in abdominal & pelvic pain and/or change in bowels [still having 4-8 BM daily] that CT Abdominal would be considered. Pt reports last day of Uribel today w/o relief of symptoms/SLS Please advise.

## 2012-10-05 NOTE — Telephone Encounter (Signed)
Addendum: please return pt call on Mobile number. Thanks/SLS

## 2012-10-06 ENCOUNTER — Telehealth: Payer: Self-pay

## 2012-10-06 NOTE — Telephone Encounter (Signed)
Please advise Sharon's note

## 2012-10-06 NOTE — Telephone Encounter (Signed)
Pt informed and states she is still having loose stools. Per md if no better come in next week to get stool samples. Pt stated understanding

## 2012-10-06 NOTE — Progress Notes (Signed)
Pt was informed by sharon yesterday

## 2012-10-06 NOTE — Telephone Encounter (Signed)
Message copied by Court Joy on Fri Oct 06, 2012  2:46 PM ------      Message from: Luisa Dago      Created: Fri Oct 06, 2012  1:22 PM       I have attempted to contact this patient by phone with the following results: left message to return my call on answering machine (home).  Advised to call [facility] office and ask for [employer]. ------

## 2012-10-06 NOTE — Telephone Encounter (Signed)
I am a little confused, does she have the cipro yet? Also with the loose stool she is going to need to do stool cultures. Needs stool for CDiff, O & P, for WBC and giardia. Then if abdominal pain gets worse may need to be seen in ER for pain especially if fevers develop. If pain persists after UTI treated and if stool cultures are neg then will need CT scan

## 2012-10-17 ENCOUNTER — Encounter: Payer: Self-pay | Admitting: Cardiology

## 2012-10-19 ENCOUNTER — Encounter: Payer: Self-pay | Admitting: Family Medicine

## 2012-10-19 ENCOUNTER — Ambulatory Visit (INDEPENDENT_AMBULATORY_CARE_PROVIDER_SITE_OTHER): Payer: Medicare Other | Admitting: Family Medicine

## 2012-10-19 VITALS — BP 130/80 | HR 83 | Temp 98.5°F | Ht 65.0 in | Wt 156.1 lb

## 2012-10-19 DIAGNOSIS — R109 Unspecified abdominal pain: Secondary | ICD-10-CM

## 2012-10-19 DIAGNOSIS — N760 Acute vaginitis: Secondary | ICD-10-CM

## 2012-10-19 DIAGNOSIS — B002 Herpesviral gingivostomatitis and pharyngotonsillitis: Secondary | ICD-10-CM

## 2012-10-19 DIAGNOSIS — J329 Chronic sinusitis, unspecified: Secondary | ICD-10-CM

## 2012-10-19 DIAGNOSIS — B009 Herpesviral infection, unspecified: Secondary | ICD-10-CM

## 2012-10-19 DIAGNOSIS — R300 Dysuria: Secondary | ICD-10-CM

## 2012-10-19 DIAGNOSIS — B0089 Other herpesviral infection: Secondary | ICD-10-CM

## 2012-10-19 DIAGNOSIS — N390 Urinary tract infection, site not specified: Secondary | ICD-10-CM

## 2012-10-19 MED ORDER — METRONIDAZOLE 500 MG PO TABS: 500.0000 mg | ORAL_TABLET | Freq: Three times a day (TID) | ORAL | Status: DC

## 2012-10-19 MED ORDER — AMOXICILLIN-POT CLAVULANATE 875-125 MG PO TABS: 1.0000 | ORAL_TABLET | Freq: Two times a day (BID) | ORAL | Status: DC

## 2012-10-19 MED ORDER — HYOSCYAMINE SULFATE 0.125 MG SL SUBL: 0.1250 mg | SUBLINGUAL_TABLET | SUBLINGUAL | Status: DC | PRN

## 2012-10-19 MED ORDER — ACYCLOVIR 400 MG PO TABS: 400.0000 mg | ORAL_TABLET | Freq: Three times a day (TID) | ORAL | Status: DC

## 2012-10-19 NOTE — Patient Instructions (Addendum)
Vaginitis  Vaginitis is an infection. It causes soreness, swelling, and redness (inflammation) of the vagina. Many of these infections are sexually transmitted diseases (STDs). Having unprotected sex can cause further problems and complications such as:   Chronic pelvic pain.   Infertility.   Unwanted pregnancy.   Abortion.   Tubal pregnancy.   Infection passed on to the newborn.   Cancer.  CAUSES    Monilia. This is a yeast or fungus infection, not an STD.   Bacterial vaginosis. The normal balance of bacteria in the vagina is disrupted and is replaced by an overgrowth of certain bacteria.   Gonorrhea, chlamydia. These are bacterial infections that are STDs.   Vaginal sponges, diaphragms, and intrauterine devices.   Trichomoniasis. This is a STD infection caused by a parasite.   Viruses like herpes and human papillomavirus. Both are STDs.   Pregnancy.   Immunosuppression. This occurs with certain conditions such as HIV infection or cancer.   Using bubble bath.   Taking certain antibiotic medicines.   Sporadic recurrence can occur if you become sick.   Diabetes.   Steroids.   Allergic reaction. If you have an allergy to:   Douches.   Soaps.   Spermicides.   Condoms.   Scented tampons or vaginal sprays.  SYMPTOMS    Abnormal vaginal discharge.   Itching of the vagina.   Pain in the vagina.   Swelling of the vagina.  In some cases, there are no symptoms.  TREATMENT   Treatment will vary depending on the type of infection.   Bacteria or trichomonas are usually treated with oral antibiotics and sometimes vaginal cream or suppositories.   Monilia vaginitis is usually treated with vaginal creams, suppositories, or oral antifungal pills.   Viral vaginitis has no cure. However, the symptoms of herpes (a viral vaginitis) can be treated to relieve the discomfort. Human papillomavirus has no symptoms. However, there are treatments for the diseases caused by human papillomavirus.   With allergic  vaginitis, you need to stop using the product that is causing the problem. Vaginal creams can be used to treat the symptoms.   When treating an STD, the sex partner should also be treated.  HOME CARE INSTRUCTIONS    Take all the medicines as directed by your caregiver.   Do not use scented tampons, soaps, or vaginal sprays.   Do not douche.   Tell your sex partner if you have a vaginal infection or an STD.   Do not have sexual intercourse until you have treated the vaginitis.   Practice safe sex by using condoms.  SEEK MEDICAL CARE IF:    You have abdominal pain.   Your symptoms get worse during treatment.  Document Released: 04/11/2007 Document Revised: 09/06/2011 Document Reviewed: 12/05/2008  Exit[employer] Patient Information 2013 [employer].

## 2012-10-20 LAB — URINALYSIS
Glucose, UA: NEGATIVE mg/dL
Hgb urine dipstick: NEGATIVE
Leukocytes, UA: NEGATIVE
Nitrite: NEGATIVE
Protein, ur: NEGATIVE mg/dL
Specific Gravity, Urine: 1.028 (ref 1.005–1.030)
Urobilinogen, UA: 0.2 mg/dL (ref 0.0–1.0)
pH: 5 (ref 5.0–8.0)

## 2012-10-21 ENCOUNTER — Encounter: Payer: Self-pay | Admitting: Family Medicine

## 2012-10-21 DIAGNOSIS — N760 Acute vaginitis: Secondary | ICD-10-CM | POA: Insufficient documentation

## 2012-10-21 DIAGNOSIS — N390 Urinary tract infection, site not specified: Secondary | ICD-10-CM | POA: Insufficient documentation

## 2012-10-21 DIAGNOSIS — B002 Herpesviral gingivostomatitis and pharyngotonsillitis: Secondary | ICD-10-CM | POA: Insufficient documentation

## 2012-10-21 LAB — URINE CULTURE
Colony Count: NO GROWTH
Organism ID, Bacteria: NO GROWTH

## 2012-10-21 NOTE — Assessment & Plan Note (Signed)
Recent episode well treated, urine culture negative

## 2012-10-21 NOTE — Assessment & Plan Note (Signed)
Flagyl 500 mg po bid, start a probiotic and return for further exam if symptoms do not resolve

## 2012-10-21 NOTE — Assessment & Plan Note (Signed)
Augmentin, mucinex probiotics

## 2012-10-21 NOTE — Assessment & Plan Note (Signed)
Acyclovir rx given today

## 2012-10-21 NOTE — Progress Notes (Signed)
Patient ID: [name], female   DOB: [birth year], 57 y.o.   MRN: [id number] ICEL CASTLES [id number] 07/06/1955 10/21/2012      Progress Note-Follow Up  Subjective  Chief Complaint  Chief Complaint  Patient presents with  . Follow-up    2 week    HPI  Patient is a 57 year old female who is in today in followup. She continues to be very sad and stressed the death of her pressure as well as multiple other recent losses. She's had trouble with recurrent mouth blisters which are so painful she does not want eat. She's also complaining of, malaise, myalgias and a mild increase in back in abdominal pain. She's recently been treated for a urinary tract infection with urine they'll and Cipro. Does still continue to have some mild dysuria, frequency, urgency and does notice some will reschedule discharge which is dark as well. Has occasional green sputum produced but no high-grade fevers. There's some throat irritation with postnasal to consider using noted as well.  Past Medical History  Diagnosis Date  . Allergic rhinitis   . Tremor   . Edema of lower extremity   . Neuropathy   . Hypothyroidism   . Anxiety   . Depression   . History of migraine headaches     only one in the past  . Breast cancer   . Nonischemic cardiomyopathy     echocardiogram done 6/12 demonstrated reduced LV function with an EF of 45-50% and grade 1 diastolic dysfunction  . Congenital anomaly of superior vena cava     R Ht cath 7/12:  NL pressures, no pulmonary hypertension and normal wedge pressure; probable congenital anomaly with at least a left sided SVC going into the coronary sinus.  Right-sided sats were somewhat elevated, although they seem to be elevated uniformly from the PA, RA and RV.  consider bubble study from left arm or cardiac MRI or CT  . H/O hiatal hernia     found during endoscopy in 2012  . Arthritis     hip, knees, feet, ankles  . Depression with anxiety 12/28/2010  . CAD (coronary artery  disease)     Nonobstructive by cath 7/12:  50% proximal LAD  . Dyspnea     when over exerted  . Pneumonia unknown  . Postmenopausal bleeding 08/23/2012  . Esophageal reflux 10/04/2012  . Vaginitis and vulvovaginitis 10/21/2012  . Recurrent oral herpes simplex 10/21/2012    Past Surgical History  Procedure Laterality Date  . Bone marrow transplant  01/95  . Cervical conization w/bx    . Arthoscopy      rt. knee  . Axillary node dissection    . Bone marrow harvest    . Port-a-cath removed    . Hickman placed    . Bone marrow transplant    . Biopsy breast      L & R  . Combined hysteroscopy diagnostic / d&c    . Colonoscopy    . Cardiac catheterization    . Mastectomy, partial  1994    right  . Hysteroscopy w/d&c N/A 08/23/2012    Procedure: DILATATION AND CURETTAGE /HYSTEROSCOPY;  Surgeon: Robley Fries, MD;  Location: [facility];  Service: Gynecology;  Laterality: N/A;  Removal of expelled essure coil    Family History  Problem Relation Age of Onset  . COPD Mother   . Breast cancer Maternal Grandmother   . Tuberculosis Maternal Grandmother   . Stroke Maternal Grandmother   . Allergies  Father   . Heart disease Father   . Stroke Father   . Heart attack Father   . Allergies Sister   . Deep vein thrombosis Sister     History   Social History  . Marital Status: Married    Spouse Name: N/A    Number of Children: N/A  . Years of Education: N/A   Occupational History  . homemaker    Social History Main Topics  . Smoking status: Never Smoker   . Smokeless tobacco: Never Used  . Alcohol Use: 0.6 oz/week    1 Glasses of wine per week     Comment: occasionally   . Drug Use: No  . Sexually Active: Yes    Birth Control/ Protection: Post-menopausal   Other Topics Concern  . Not on file   Social History Narrative   Lives in [place]   Has been married for 4 yerars.  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 ALPRAZolam (XANAX) 0.5 MG tablet Take 1 tablet (0.5 mg total) by mouth 3 (three) times daily as needed for sleep.  90 tablet  1  . buPROPion (WELLBUTRIN XL) 300 MG 24 hr tablet Take 1 tablet (300 mg total) by mouth daily.  30 tablet  3  . furosemide (LASIX) 40 MG tablet Take 40 mg by mouth daily as needed (fluid retention).       Marland Kitchen HYDROcodone-acetaminophen (NORCO) 10-325 MG per tablet Take 1 tablet by mouth every 6 (six) hours as needed for pain. For pain.      Marland Kitchen ibuprofen (MOTRIN IB) 200 MG tablet Take 3 tablets (600 mg total) by mouth every 8 (eight) hours as needed for pain.  20 tablet  0  . levothyroxine (SYNTHROID, LEVOTHROID) 75 MCG tablet Take 1 tablet (75 mcg total) by mouth daily.  30 tablet  3  . ondansetron (ZOFRAN-ODT) 8 MG disintegrating tablet Take 1 tablet (8 mg total) by mouth every 8 (eight) hours as needed for nausea.  20 tablet  0  . ranitidine (ZANTAC) 300 MG tablet Take 1 tablet (300 mg total) by mouth at bedtime.  30 tablet  2  . traZODone (DESYREL) 50 MG tablet Take 50 mg by mouth at bedtime.        No current facility-administered medications on file prior to visit.    Allergies  Allergen Reactions  . Codeine   . Cymbalta (Duloxetine Hcl)     Personality changes  . Lamictal (Lamotrigine)   . Morphine And Related   . Sertraline Hcl     Review of Systems  Review of Systems  Constitutional: Negative for fever and malaise/fatigue.  HENT: Positive for congestion and sore throat.   Eyes: Negative for discharge.  Respiratory: Positive for cough, sputum production and shortness of breath.   Cardiovascular: Negative for chest pain, palpitations and leg swelling.  Gastrointestinal: Positive for nausea and abdominal pain. Negative for diarrhea.  Genitourinary: Positive for urgency and frequency. Negative for dysuria.  Musculoskeletal: Positive for back pain. Negative for falls.  Skin:  Negative for rash.  Neurological: Negative for loss of consciousness and headaches.  Endo/Heme/Allergies: Negative for polydipsia.  Psychiatric/Behavioral: Negative for depression and suicidal ideas. The patient is not nervous/anxious and does not have insomnia.     Objective  BP 130/80  Pulse 83  Temp(Src) 98.5 F (36.9 C) (Oral)  Ht 5\' 5"  (1.651 m)  Wt 156 lb 1.9 oz (70.816 kg)  BMI 25.98 kg/m2  SpO2 95%  Physical Exam  Physical Exam  Constitutional: She is oriented to person, place, and time and well-developed, well-nourished, and in no distress. No distress.  HENT:  Head: Normocephalic and atraumatic.  Eyes: Conjunctivae are normal.  Neck: Neck supple. No thyromegaly present.  Cardiovascular: Normal rate, regular rhythm and normal heart sounds.   No murmur heard. Pulmonary/Chest: Effort normal and breath sounds normal. She has no wheezes.  Abdominal: She exhibits no distension and no mass.  Musculoskeletal: She exhibits no edema.  Lymphadenopathy:    She has no cervical adenopathy.  Neurological: She is alert and oriented to person, place, and time.  Skin: Skin is warm and dry. No rash noted. She is not diaphoretic.  Psychiatric: Memory, affect and judgment normal.    Lab Results  Component Value Date   TSH 1.649 08/07/2012   Lab Results  Component Value Date   WBC 7.0 10/02/2012   HGB 14.2 10/02/2012   HCT 41.8 10/02/2012   MCV 95.0 10/02/2012   PLT 235 10/02/2012   Lab Results  Component Value Date   CREATININE 0.86 10/02/2012   BUN 17 10/02/2012   NA 138 10/02/2012   K 4.1 10/02/2012   CL 106 10/02/2012   CO2 26 10/02/2012   Lab Results  Component Value Date   ALT 21 10/02/2012   AST 26 10/02/2012   ALKPHOS 75 10/02/2012   BILITOT 0.4 10/02/2012   Lab Results  Component Value Date   CHOL 209* 09/27/2011   Lab Results  Component Value Date   HDL 50 09/27/2011   Lab Results  Component Value Date   LDLCALC 143* 09/27/2011   Lab Results  Component Value Date   TRIG 79  09/27/2011   Lab Results  Component Value Date   CHOLHDL 4.2 09/27/2011     Assessment & Plan  Vaginitis and vulvovaginitis Flagyl 500 mg po bid, start a probiotic and return for further exam if symptoms do not resolve  Sinusitis Augmentin, mucinex probiotics  UTI (urinary tract infection) Recent episode well treated, urine culture negative  Recurrent oral herpes simplex Acyclovir rx given today

## 2012-11-02 ENCOUNTER — Other Ambulatory Visit: Payer: Self-pay | Admitting: Gastroenterology

## 2012-11-02 DIAGNOSIS — R1032 Left lower quadrant pain: Secondary | ICD-10-CM

## 2012-11-03 ENCOUNTER — Ambulatory Visit: Payer: Medicare Other | Admitting: Family Medicine

## 2012-11-06 ENCOUNTER — Other Ambulatory Visit: Payer: Medicare Other

## 2012-11-08 ENCOUNTER — Ambulatory Visit: Payer: Medicare Other | Admitting: Family Medicine

## 2012-11-08 ENCOUNTER — Ambulatory Visit
Admission: RE | Admit: 2012-11-08 | Discharge: 2012-11-08 | Disposition: A | Discharge status: Home / Self Care | Payer: Medicare Other | Source: Ambulatory Visit | Attending: Gastroenterology | Admitting: Gastroenterology

## 2012-11-08 DIAGNOSIS — R1032 Left lower quadrant pain: Secondary | ICD-10-CM

## 2012-11-08 MED ORDER — IOHEXOL 300 MG/ML  SOLN
100.0000 mL | Freq: Once | INTRAMUSCULAR | Status: AC | PRN
  Administered 2012-11-08: 100 mL via INTRAVENOUS

## 2012-11-09 ENCOUNTER — Ambulatory Visit (INDEPENDENT_AMBULATORY_CARE_PROVIDER_SITE_OTHER): Payer: Medicare Other | Admitting: Family Medicine

## 2012-11-09 ENCOUNTER — Encounter: Payer: Self-pay | Admitting: Family Medicine

## 2012-11-09 VITALS — BP 112/80 | HR 89 | Temp 97.8°F | Ht 65.0 in | Wt 152.1 lb

## 2012-11-09 DIAGNOSIS — J329 Chronic sinusitis, unspecified: Secondary | ICD-10-CM

## 2012-11-09 DIAGNOSIS — J309 Allergic rhinitis, unspecified: Secondary | ICD-10-CM

## 2012-11-09 DIAGNOSIS — K5900 Constipation, unspecified: Secondary | ICD-10-CM

## 2012-11-09 DIAGNOSIS — E039 Hypothyroidism, unspecified: Secondary | ICD-10-CM

## 2012-11-09 MED ORDER — METHYLPREDNISOLONE 4 MG PO KIT: PACK | ORAL | Status: DC

## 2012-11-09 MED ORDER — CEFDINIR 300 MG PO CAPS: 300.0000 mg | ORAL_CAPSULE | Freq: Two times a day (BID) | ORAL | Status: AC

## 2012-11-09 MED ORDER — MONTELUKAST SODIUM 10 MG PO TABS: 10.0000 mg | ORAL_TABLET | Freq: Every evening | ORAL | Status: DC | PRN

## 2012-11-09 NOTE — Progress Notes (Signed)
Patient ID: [name], female   DOB: 04-20-1956, 57 y.o.   MRN: [id number] [name] [id number] 12-Mar-1956 11/09/2012      Progress Note-Follow Up  Subjective  Chief Complaint  Chief Complaint  Patient presents with  . Follow-up    2 week    HPI  Patient is a 57 year old Caucasian female with multiple concerns. She's having a great deal of nasal congestion persistently. Has facial headaches, rhinorrhea that is sometimes green. No fevers or chills she does have itchy watery eyes and nose. Did recently have a CT of the sinuses which was negative. He is struggling with some new onset constipation. Struggling to move her bowels mostly daily but having to strain. No bloody or tarry stool. Is following with GYN and so far no cause of her abdominal pain has been found. No chest pain, palpitations, shortness of breath is noted  Past Medical History  Diagnosis Date  . Allergic rhinitis   . Tremor   . Edema of lower extremity   . Neuropathy   . Hypothyroidism   . Anxiety   . Depression   . History of migraine headaches     only one in the past  . Breast cancer   . Nonischemic cardiomyopathy     echocardiogram done 6/12 demonstrated reduced LV function with an EF of 45-50% and grade 1 diastolic dysfunction  . Congenital anomaly of superior vena cava     R Ht cath 7/12:  NL pressures, no pulmonary hypertension and normal wedge pressure; probable congenital anomaly with at least a left sided SVC going into the coronary sinus.  Right-sided sats were somewhat elevated, although they seem to be elevated uniformly from the PA, RA and RV.  consider bubble study from left arm or cardiac MRI or CT  . H/O hiatal hernia     found during endoscopy in 2012  . Arthritis     hip, knees, feet, ankles  . Depression with anxiety 12/28/2010  . CAD (coronary artery disease)     Nonobstructive by cath 7/12:  50% proximal LAD  . Dyspnea     when over exerted  . Pneumonia unknown  . Postmenopausal  bleeding 08/23/2012  . Esophageal reflux 10/04/2012  . Vaginitis and vulvovaginitis 10/21/2012  . Recurrent oral herpes simplex 10/21/2012    Past Surgical History  Procedure Laterality Date  . Bone marrow transplant  01/95  . Cervical conization w/bx    . Arthoscopy      rt. knee  . Axillary node dissection    . Bone marrow harvest    . Port-a-cath removed    . Hickman placed    . Bone marrow transplant    . Biopsy breast      L & R  . Combined hysteroscopy diagnostic / d&c    . Colonoscopy    . Cardiac catheterization    . Mastectomy, partial  1994    right  . Hysteroscopy w/d&c N/A 08/23/2012    Procedure: DILATATION AND CURETTAGE /HYSTEROSCOPY;  Surgeon: Robley Fries, MD;  Location: [facility];  Service: Gynecology;  Laterality: N/A;  Removal of expelled essure coil    Family History  Problem Relation Age of Onset  . COPD Mother   . Breast cancer Maternal Grandmother   . Tuberculosis Maternal Grandmother   . Stroke Maternal Grandmother   . Allergies Father   . Heart disease Father   . Stroke Father   . Heart attack Father   .  Allergies Sister   . Deep vein thrombosis Sister     History   Social History  . Marital Status: Married    Spouse Name: N/A    Number of Children: N/A  . Years of Education: N/A   Occupational History  . homemaker    Social History Main Topics  . Smoking status: Never Smoker   . Smokeless tobacco: Never Used  . Alcohol Use: 0.6 oz/week    1 Glasses of wine per week     Comment: occasionally   . Drug Use: No  . Sexually Active: Yes    Birth Control/ Protection: Post-menopausal   Other Topics Concern  . Not on file   Social History Narrative   Lives in [place]   Has been married for 4 yerars.  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 ALPRAZolam (XANAX) 0.5 MG tablet Take 1 tablet (0.5 mg  total) by mouth 3 (three) times daily as needed for sleep.  90 tablet  1  . buPROPion (WELLBUTRIN XL) 300 MG 24 hr tablet Take 1 tablet (300 mg total) by mouth daily.  30 tablet  3  . furosemide (LASIX) 40 MG tablet Take 40 mg by mouth daily as needed (fluid retention).       Marland Kitchen HYDROcodone-acetaminophen (NORCO) 10-325 MG per tablet Take 1 tablet by mouth every 6 (six) hours as needed for pain. For pain.      . hyoscyamine (LEVSIN SL) 0.125 MG SL tablet Place 1 tablet (0.125 mg total) under the tongue every 4 (four) hours as needed for cramping.  30 tablet  0  . ibuprofen (MOTRIN IB) 200 MG tablet Take 3 tablets (600 mg total) by mouth every 8 (eight) hours as needed for pain.  20 tablet  0  . levothyroxine (SYNTHROID, LEVOTHROID) 75 MCG tablet Take 1 tablet (75 mcg total) by mouth daily.  30 tablet  3  . ondansetron (ZOFRAN-ODT) 8 MG disintegrating tablet Take 1 tablet (8 mg total) by mouth every 8 (eight) hours as needed for nausea.  20 tablet  0  . ranitidine (ZANTAC) 300 MG tablet Take 1 tablet (300 mg total) by mouth at bedtime.  30 tablet  2  . traZODone (DESYREL) 50 MG tablet Take 50 mg by mouth at bedtime.        No current facility-administered medications on file prior to visit.    Allergies  Allergen Reactions  . Codeine   . Cymbalta (Duloxetine Hcl)     Personality changes  . Lamictal (Lamotrigine)   . Morphine And Related   . Sertraline Hcl     Review of Systems  Review of Systems  Constitutional: Positive for malaise/fatigue. Negative for fever.  HENT: Positive for congestion and sore throat.   Eyes: Negative for discharge.  Respiratory: Positive for cough and sputum production. Negative for shortness of breath.   Cardiovascular: Negative for chest pain, palpitations and leg swelling.  Gastrointestinal: Positive for abdominal pain, diarrhea and constipation. Negative for nausea.  Genitourinary: Negative for dysuria.  Musculoskeletal: Negative for falls.  Skin: Negative  for rash.  Neurological: Positive for headaches. Negative for loss of consciousness.  Endo/Heme/Allergies: Negative for polydipsia.  Psychiatric/Behavioral: Positive for depression. Negative for suicidal ideas. The patient is nervous/anxious and has insomnia.     Objective  BP 112/80  Pulse 89  Temp(Src) 97.8  F (36.6 C) (Oral)  Ht 5\' 5"  (1.651 m)  Wt 152 lb 1.3 oz (68.983 kg)  BMI 25.31 kg/m2  SpO2 97%  Physical Exam  Physical Exam  Constitutional: She is oriented to person, place, and time and well-developed, well-nourished, and in no distress. No distress.  HENT:  Head: Normocephalic and atraumatic.  Eyes: Conjunctivae are normal.  Neck: Neck supple. No thyromegaly present.  Cardiovascular: Normal rate, regular rhythm and normal heart sounds.   No murmur heard. Pulmonary/Chest: Effort normal and breath sounds normal. She has no wheezes.  Abdominal: Soft. Bowel sounds are normal. She exhibits no distension and no mass. There is tenderness. There is no rebound and no guarding.  Musculoskeletal: She exhibits no edema.  Lymphadenopathy:    She has no cervical adenopathy.  Neurological: She is alert and oriented to person, place, and time.  Skin: Skin is warm and dry. No rash noted. She is not diaphoretic.  Psychiatric: Memory, affect and judgment normal.    Lab Results  Component Value Date   TSH 1.649 08/07/2012   Lab Results  Component Value Date   WBC 7.0 10/02/2012   HGB 14.2 10/02/2012   HCT 41.8 10/02/2012   MCV 95.0 10/02/2012   PLT 235 10/02/2012   Lab Results  Component Value Date   CREATININE 0.86 10/02/2012   BUN 17 10/02/2012   NA 138 10/02/2012   K 4.1 10/02/2012   CL 106 10/02/2012   CO2 26 10/02/2012   Lab Results  Component Value Date   ALT 21 10/02/2012   AST 26 10/02/2012   ALKPHOS 75 10/02/2012   BILITOT 0.4 10/02/2012   Lab Results  Component Value Date   CHOL 209* 09/27/2011   Lab Results  Component Value Date   HDL 50 09/27/2011   Lab Results  Component  Value Date   LDLCALC 143* 09/27/2011   Lab Results  Component Value Date   TRIG 79 09/27/2011   Lab Results  Component Value Date   CHOLHDL 4.2 09/27/2011     Assessment & Plan  Hypothyroidism Well treated no changes   Unspecified constipation Encouraged probiotics, fiber, increased fluids and consider Miralax daily as needed.  ALLERGIC RHINITIS Struggling is following with ENT, will try adding Singulair

## 2012-11-09 NOTE — Patient Instructions (Addendum)
Zyrtec/Cetirizine or Claritin/Loratadine 10 mg twice a day Nasal saline flushes twice a day Continue probiotic, increase fiber to twice a day  2 tbls of Milk of Magnesium in 4 oz of warm prune juice and a Dulcolax suppository per rectum repeat in 4-6 hours if no result Miralax 1/2 dose daily to start after that  Add Melatonin 3-5 mg 1 hour prior to bedtime

## 2012-11-12 ENCOUNTER — Encounter: Payer: Self-pay | Admitting: Family Medicine

## 2012-11-12 DIAGNOSIS — K5900 Constipation, unspecified: Secondary | ICD-10-CM | POA: Insufficient documentation

## 2012-11-12 NOTE — Assessment & Plan Note (Signed)
Encouraged probiotics, fiber, increased fluids and consider Miralax daily as needed.

## 2012-11-12 NOTE — Assessment & Plan Note (Signed)
Well treated no changes 

## 2012-11-12 NOTE — Assessment & Plan Note (Signed)
Struggling is following with ENT, will try adding Singulair

## 2012-11-17 ENCOUNTER — Other Ambulatory Visit: Payer: Self-pay | Admitting: Internal Medicine

## 2012-11-17 NOTE — Telephone Encounter (Signed)
Refill request for trazodone Last seen-11/09/12 F/U- none

## 2012-11-21 ENCOUNTER — Telehealth: Payer: Self-pay | Admitting: Family Medicine

## 2012-11-21 MED ORDER — TRAZODONE HCL 50 MG PO TABS: 50.0000 mg | ORAL_TABLET | Freq: Every day | ORAL | Status: DC

## 2012-11-21 NOTE — Telephone Encounter (Signed)
Please advise Trazodone refill?  If ok fax to [phone]

## 2012-11-21 NOTE — Telephone Encounter (Signed)
TRAZODONE 50 MG TABLET TAKE 1 TABLET BY MOUTH AT BEDTIME QTY 90

## 2012-11-21 NOTE — Telephone Encounter (Signed)
OK to refill Trazodone, same strength, same # 90, 1 rf

## 2012-12-12 ENCOUNTER — Other Ambulatory Visit: Payer: Self-pay

## 2012-12-12 DIAGNOSIS — Z1231 Encounter for screening mammogram for malignant neoplasm of breast: Secondary | ICD-10-CM

## 2013-01-11 ENCOUNTER — Other Ambulatory Visit: Payer: Self-pay

## 2013-01-11 ENCOUNTER — Ambulatory Visit
Admission: RE | Admit: 2013-01-11 | Discharge: 2013-01-11 | Disposition: A | Discharge status: Home / Self Care | Payer: Medicare Other | Source: Ambulatory Visit

## 2013-01-11 DIAGNOSIS — Z1231 Encounter for screening mammogram for malignant neoplasm of breast: Secondary | ICD-10-CM

## 2013-01-25 ENCOUNTER — Encounter (INDEPENDENT_AMBULATORY_CARE_PROVIDER_SITE_OTHER): Payer: Self-pay

## 2013-01-25 ENCOUNTER — Ambulatory Visit (INDEPENDENT_AMBULATORY_CARE_PROVIDER_SITE_OTHER): Payer: Medicare Other | Admitting: General Surgery

## 2013-01-25 ENCOUNTER — Encounter (INDEPENDENT_AMBULATORY_CARE_PROVIDER_SITE_OTHER): Payer: Self-pay | Admitting: General Surgery

## 2013-01-25 VITALS — BP 114/70 | HR 88 | Resp 16 | Ht 65.5 in | Wt 152.6 lb

## 2013-01-25 DIAGNOSIS — N6452 Nipple discharge: Secondary | ICD-10-CM

## 2013-01-25 DIAGNOSIS — N6459 Other signs and symptoms in breast: Secondary | ICD-10-CM

## 2013-01-25 DIAGNOSIS — Z853 Personal history of malignant neoplasm of breast: Secondary | ICD-10-CM

## 2013-01-25 NOTE — Patient Instructions (Addendum)
We will wait for cardiac clearance from her cardiologist at [facility] and then schedule left breast duct excision.

## 2013-01-25 NOTE — Progress Notes (Signed)
Chief complaint:  Left nipple discharge and personal history of breast cancer  History: Patient returns to the office. She is well known to me from a previous history of stage III cancer of the right breast with no primary identified, status post right axillary dissection and radiation to the right breast and subsequent high dose chemotherapy and bone marrow transplant at [facility]. This was 1995 and she has had no evidence of recurrent cancer on long-term followup. She also has chronic fibrocystic breast disease and has had bilateral benign breast biopsies with findings of fibrocystic disease. She comes to the office today due to persistent left nipple discharge. On review of her records she has actually had intermittent greenish discharge noted from the left breast for a number of years. More recently she feels this has been increased. She has noted what appears to be bloody spots in her bra intermittently. She recently presented for routine imaging at the breast center. I've reviewed her images. Mammogram was unremarkable. Ultrasound showed some dilated ducts immediately behind the left nipple without mass. There was a small cluster of microcysts away from the nipple. She has not noted any lumps. She has occasional breast pain which is chronic.  Past Medical History  Diagnosis Date  . Allergic rhinitis   . Tremor   . Edema of lower extremity   . Neuropathy   . Hypothyroidism   . Anxiety   . Depression   . History of migraine headaches     only one in the past  . Breast cancer   . Nonischemic cardiomyopathy     echocardiogram done 6/12 demonstrated reduced LV function with an EF of 45-50% and grade 1 diastolic dysfunction  . Congenital anomaly of superior vena cava     R Ht cath 7/12:  NL pressures, no pulmonary hypertension and normal wedge pressure; probable congenital anomaly with at least a left sided SVC going into the coronary sinus.  Right-sided sats were somewhat elevated, although they seem  to be elevated uniformly from the PA, RA and RV.  consider bubble study from left arm or cardiac MRI or CT  . H/O hiatal hernia     found during endoscopy in 2012  . Arthritis     hip, knees, feet, ankles  . Depression with anxiety 12/28/2010  . CAD (coronary artery disease)     Nonobstructive by cath 7/12:  50% proximal LAD  . Dyspnea     when over exerted  . Pneumonia unknown  . Postmenopausal bleeding 08/23/2012  . Esophageal reflux 10/04/2012  . Vaginitis and vulvovaginitis 10/21/2012  . Recurrent oral herpes simplex 10/21/2012  . Unspecified constipation 11/12/2012   Past Surgical History  Procedure Laterality Date  . Bone marrow transplant  01/95  . Cervical conization w/bx    . Arthoscopy      rt. knee  . Axillary node dissection    . Bone marrow harvest    . Port-a-cath removed    . Hickman placed    . Bone marrow transplant    . Biopsy breast      L & R  . Combined hysteroscopy diagnostic / d&c    . Colonoscopy    . Cardiac catheterization    . Mastectomy, partial  1994    right  . Hysteroscopy w/d&c N/A 08/23/2012    Procedure: DILATATION AND CURETTAGE /HYSTEROSCOPY;  Surgeon: Vaishali R Mody, MD;  Location: [facility];  Service: Gynecology;  Laterality: N/A;  Removal of expelled essure coil     Current Outpatient Prescriptions  Medication Sig Dispense Refill  . ALPRAZolam (XANAX) 0.5 MG tablet Take 1 tablet (0.5 mg total) by mouth 3 (three) times daily as needed for sleep.  90 tablet  1  . buPROPion (WELLBUTRIN XL) 300 MG 24 hr tablet Take 1 tablet (300 mg total) by mouth daily.  30 tablet  3  . furosemide (LASIX) 40 MG tablet Take 40 mg by mouth daily as needed (fluid retention).       . HYDROcodone-acetaminophen (NORCO) 10-325 MG per tablet Take 1 tablet by mouth every 6 (six) hours as needed for pain. For pain.      . ibuprofen (MOTRIN IB) 200 MG tablet Take 3 tablets (600 mg total) by mouth every 8 (eight) hours as needed for pain.  20 tablet  0  . levothyroxine  (SYNTHROID, LEVOTHROID) 75 MCG tablet Take 1 tablet (75 mcg total) by mouth daily.  30 tablet  3  . traZODone (DESYREL) 50 MG tablet Take 1 tablet (50 mg total) by mouth at bedtime.  90 tablet  1  . hyoscyamine (LEVSIN SL) 0.125 MG SL tablet Place 1 tablet (0.125 mg total) under the tongue every 4 (four) hours as needed for cramping.  30 tablet  0  . methylPREDNISolone (MEDROL, PAK,) 4 MG tablet follow package directions  21 tablet  0  . montelukast (SINGULAIR) 10 MG tablet Take 1 tablet (10 mg total) by mouth at bedtime as needed.  30 tablet  3  . ondansetron (ZOFRAN-ODT) 8 MG disintegrating tablet Take 1 tablet (8 mg total) by mouth every 8 (eight) hours as needed for nausea.  20 tablet  0  . ranitidine (ZANTAC) 300 MG tablet Take 1 tablet (300 mg total) by mouth at bedtime.  30 tablet  2   No current facility-administered medications for this visit.   Allergies  Allergen Reactions  . Codeine   . Cymbalta (Duloxetine Hcl)     Personality changes  . Lamictal (Lamotrigine)   . Morphine And Related   . Sertraline Hcl    Exam: BP 114/70  Pulse 88  Resp 16  Ht 5' 5.5" (1.664 m)  Wt 152 lb 9.6 oz (69.219 kg)  BMI 25 kg/m2 General: Well-appearing in no distress Skin: No rash or infection HEENT: No palpable mass or thyromegaly. Lymph nodes: No cervical, supraclavicular or axillary nodes palpable Lungs: Clear equal breath sounds. Pectus excavatum. Breasts: Some mild tourniquet of the right breast post radiation. Well-healed incisions. No axillary masses on the right. No palpable masses in either breast. I am unable to express any nipple discharge today.  Assessment and plan: Personal history of breast cancer and fibrocystic disease as above. She appears to have somewhat worsening left nipple discharge which is subjectively bloody and ultrasound showing some dilated ducts although no mass. I discussed with the patient and her husband today that this very likely represents benign fibrocystic  disease. I think our options would include continued followup with repeat ultrasound in 6 months versus proceeding with a central duct excision. Understandably the patient is very concerned due to her personal history and she feels that she would like the confirmation of tissue examination. This would also alleviate her symptoms. Therefore after discussion including risks of anesthetic complications, bleeding, infection, we will plan to proceed with left breast central duct excision. She has had some cardiac issues recently with coronary artery disease and some anatomic abnormalities evaluated here and at [facility]. She will be seeing her cardiologist in 2 weeks and   will obtain clearance prior to proceeding with surgery. 

## 2013-01-28 ENCOUNTER — Other Ambulatory Visit: Payer: Self-pay | Admitting: Family Medicine

## 2013-01-29 NOTE — Telephone Encounter (Signed)
Rx request to pharmacy/[facility]  

## 2013-02-07 ENCOUNTER — Other Ambulatory Visit: Payer: Self-pay | Admitting: Family Medicine

## 2013-02-07 ENCOUNTER — Ambulatory Visit (HOSPITAL_COMMUNITY): Payer: Medicare Other | Admitting: Oncology

## 2013-02-07 LAB — PULMONARY FUNCTION TEST

## 2013-02-08 ENCOUNTER — Ambulatory Visit (INDEPENDENT_AMBULATORY_CARE_PROVIDER_SITE_OTHER): Payer: Medicare Other | Admitting: Family Medicine

## 2013-02-08 ENCOUNTER — Encounter (INDEPENDENT_AMBULATORY_CARE_PROVIDER_SITE_OTHER): Payer: Self-pay | Admitting: General Surgery

## 2013-02-08 ENCOUNTER — Encounter: Payer: Self-pay | Admitting: Family Medicine

## 2013-02-08 VITALS — BP 110/80 | HR 64 | Temp 98.1°F | Ht 65.0 in | Wt 150.1 lb

## 2013-02-08 DIAGNOSIS — J329 Chronic sinusitis, unspecified: Secondary | ICD-10-CM

## 2013-02-08 DIAGNOSIS — Z853 Personal history of malignant neoplasm of breast: Secondary | ICD-10-CM

## 2013-02-08 DIAGNOSIS — M25569 Pain in unspecified knee: Secondary | ICD-10-CM

## 2013-02-08 DIAGNOSIS — F411 Generalized anxiety disorder: Secondary | ICD-10-CM

## 2013-02-08 DIAGNOSIS — M25562 Pain in left knee: Secondary | ICD-10-CM

## 2013-02-08 DIAGNOSIS — R609 Edema, unspecified: Secondary | ICD-10-CM

## 2013-02-08 DIAGNOSIS — E039 Hypothyroidism, unspecified: Secondary | ICD-10-CM

## 2013-02-08 MED ORDER — FUROSEMIDE 40 MG PO TABS: 40.0000 mg | ORAL_TABLET | Freq: Every day | ORAL | Status: DC | PRN

## 2013-02-08 NOTE — Patient Instructions (Addendum)

## 2013-02-09 ENCOUNTER — Ambulatory Visit (HOSPITAL_BASED_OUTPATIENT_CLINIC_OR_DEPARTMENT_OTHER): Payer: Medicare Other

## 2013-02-09 ENCOUNTER — Ambulatory Visit (HOSPITAL_BASED_OUTPATIENT_CLINIC_OR_DEPARTMENT_OTHER)
Admission: RE | Admit: 2013-02-09 | Discharge: 2013-02-09 | Disposition: A | Discharge status: Home / Self Care | Payer: Medicare Other | Source: Ambulatory Visit | Attending: Family Medicine | Admitting: Family Medicine

## 2013-02-09 ENCOUNTER — Encounter (INDEPENDENT_AMBULATORY_CARE_PROVIDER_SITE_OTHER): Payer: Self-pay

## 2013-02-09 DIAGNOSIS — R51 Headache: Secondary | ICD-10-CM | POA: Insufficient documentation

## 2013-02-09 DIAGNOSIS — J329 Chronic sinusitis, unspecified: Secondary | ICD-10-CM

## 2013-02-09 DIAGNOSIS — R220 Localized swelling, mass and lump, head: Secondary | ICD-10-CM | POA: Insufficient documentation

## 2013-02-09 DIAGNOSIS — J342 Deviated nasal septum: Secondary | ICD-10-CM | POA: Insufficient documentation

## 2013-02-09 MED ORDER — IOHEXOL 350 MG/ML SOLN
100.0000 mL | Freq: Once | INTRAVENOUS | Status: AC | PRN
  Administered 2013-02-09: 100 mL via INTRAVENOUS

## 2013-02-09 NOTE — Progress Notes (Signed)
Quick Note:  Patient Informed and voiced understanding.   Pt would like to know why she is in so much pain then and what does she do from here? Pt informed that MD is already gone for the day ______

## 2013-02-09 NOTE — Progress Notes (Signed)
Cardiac clearance rec'd from [facility].  Patient okay to proceed with breast surgery with low risk from a cardiovascular standpoint per Dr. Merryl Hacker.  Cardiac Clearance given to medical records to scan in patients chart.  Written surgical orders placed in INBOX for OR schedulers to call patient and proceed with scheduling patient for Left Breast Central Duct Excision per last office note on 01/25/2013 with Dr. Johna Sheriff.

## 2013-02-11 ENCOUNTER — Encounter: Payer: Self-pay | Admitting: Family Medicine

## 2013-02-11 DIAGNOSIS — M25561 Pain in right knee: Secondary | ICD-10-CM

## 2013-02-11 DIAGNOSIS — R609 Edema, unspecified: Secondary | ICD-10-CM | POA: Insufficient documentation

## 2013-02-11 HISTORY — DX: Pain in right knee: M25.561

## 2013-02-11 NOTE — Assessment & Plan Note (Signed)
Given refill on Lasix which she has used in past. Minimize sodium and elevate feet.

## 2013-02-11 NOTE — Assessment & Plan Note (Signed)
Will allow an increase in Alprazolam to help her handle the stress of her uncertain diagnosis

## 2013-02-11 NOTE — Assessment & Plan Note (Addendum)
Pain is tolerable but is considering knee replacement

## 2013-02-11 NOTE — Progress Notes (Signed)
Patient ID: [name], female   DOB: 18-Sep-1955, 57 y.o.   MRN: [id number] [name] [id number] [birth year] 02/11/2013      Progress Note-Follow Up  Subjective  Chief Complaint  Chief Complaint  Patient presents with  . Follow-up    HPI  Patient is a 57 year old Caucasian female in today for followup. She was struggling with recent abnormality in one of her breasts. Is nearly 20 years cancer free after diagnosis of breast cancer. Is awaiting cardiac clearance from her cardiologist at [facility] before she proceeds with surgical excision of the lesion. Dr. Dillard Essex will perform the surgery. She has had some recent edema. Needs a refill on Lasix which is within the past. Chest pain or palpitations. No shortness or breath GI or GU complaints. Continues to struggle with left knee pain is following with orthopedics. Taking medications as prescribed. Acknowledges she is feeling more anxious as she awaits her diagnosis and surgery to  Past Medical History  Diagnosis Date  . Allergic rhinitis   . Tremor   . Edema of lower extremity   . Neuropathy   . Hypothyroidism   . Anxiety   . Depression   . History of migraine headaches     only one in the past  . Breast cancer   . Nonischemic cardiomyopathy     echocardiogram done 6/12 demonstrated reduced LV function with an EF of 45-50% and grade 1 diastolic dysfunction  . Congenital anomaly of superior vena cava     R Ht cath 7/12:  NL pressures, no pulmonary hypertension and normal wedge pressure; probable congenital anomaly with at least a left sided SVC going into the coronary sinus.  Right-sided sats were somewhat elevated, although they seem to be elevated uniformly from the PA, RA and RV.  consider bubble study from left arm or cardiac MRI or CT  . H/O hiatal hernia     found during endoscopy in 2012  . Arthritis     hip, knees, feet, ankles  . Depression with anxiety 12/28/2010  . CAD (coronary artery disease)     Nonobstructive by cath  7/12:  50% proximal LAD  . Dyspnea     when over exerted  . Pneumonia unknown  . Postmenopausal bleeding 08/23/2012  . Esophageal reflux 10/04/2012  . Vaginitis and vulvovaginitis 10/21/2012  . Recurrent oral herpes simplex 10/21/2012  . Unspecified constipation 11/12/2012  . Edema 02/11/2013  . Left knee pain 02/11/2013    Past Surgical History  Procedure Laterality Date  . Bone marrow transplant  01/95  . Cervical conization w/bx    . Arthoscopy      rt. knee  . Axillary node dissection    . Bone marrow harvest    . Port-a-cath removed    . Hickman placed    . Bone marrow transplant    . Biopsy breast      L & R  . Combined hysteroscopy diagnostic / d&c    . Colonoscopy    . Cardiac catheterization    . Mastectomy, partial  1994    right  . Hysteroscopy w/d&c N/A 08/23/2012    Procedure: DILATATION AND CURETTAGE /HYSTEROSCOPY;  Surgeon: Robley Fries, MD;  Location: [facility];  Service: Gynecology;  Laterality: N/A;  Removal of expelled essure coil    Family History  Problem Relation Age of Onset  . COPD Mother   . Breast cancer Maternal Grandmother   . Tuberculosis Maternal Grandmother   . Stroke Maternal Grandmother   .  Allergies Father   . Heart disease Father   . Stroke Father   . Heart attack Father   . Allergies Sister   . Deep vein thrombosis Sister     History   Social History  . Marital Status: Married    Spouse Name: N/A    Number of Children: N/A  . Years of Education: N/A   Occupational History  . homemaker    Social History Main Topics  . Smoking status: Never Smoker   . Smokeless tobacco: Never Used  . Alcohol Use: 0.6 oz/week    1 Glasses of wine per week     Comment: occasionally   . Drug Use: No  . Sexual Activity: Yes    Birth Control/ Protection: Post-menopausal   Other Topics Concern  . Not on file   Social History Narrative   Lives in [place]   Has been married for 4 yerars.  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 ALPRAZolam (XANAX) 0.5 MG tablet Take 1 tablet (0.5 mg total) by mouth 3 (three) times daily as needed for sleep.  90 tablet  1  . buPROPion (WELLBUTRIN XL) 300 MG 24 hr tablet take 1 tablet by mouth once daily  30 tablet  3  . HYDROcodone-acetaminophen (NORCO) 10-325 MG per tablet Take 1 tablet by mouth every 6 (six) hours as needed for pain. For pain.      . hyoscyamine (LEVSIN SL) 0.125 MG SL tablet Place 1 tablet (0.125 mg total) under the tongue every 4 (four) hours as needed for cramping.  30 tablet  0  . ibuprofen (MOTRIN IB) 200 MG tablet Take 3 tablets (600 mg total) by mouth every 8 (eight) hours as needed for pain.  20 tablet  0  . levothyroxine (SYNTHROID, LEVOTHROID) 75 MCG tablet take 1 tablet by mouth once daily  30 tablet  3  . ondansetron (ZOFRAN-ODT) 8 MG disintegrating tablet Take 1 tablet (8 mg total) by mouth every 8 (eight) hours as needed for nausea.  20 tablet  0  . traZODone (DESYREL) 50 MG tablet Take 1 tablet (50 mg total) by mouth at bedtime.  90 tablet  1   No current facility-administered medications on file prior to visit.    Allergies  Allergen Reactions  . Codeine   . Cymbalta [Duloxetine Hcl]     Personality changes  . Lamictal [Lamotrigine]   . Morphine And Related   . Sertraline Hcl     Review of Systems  Review of Systems  Constitutional: Negative for fever and malaise/fatigue.  HENT: Negative for congestion.   Eyes: Negative for discharge.  Respiratory: Negative for shortness of breath.   Cardiovascular: Negative for chest pain, palpitations and leg swelling.  Gastrointestinal: Negative for nausea, abdominal pain and diarrhea.  Genitourinary: Negative for dysuria.  Musculoskeletal: Positive for joint pain. Negative for falls.  Skin: Negative for rash.  Neurological: Negative for loss of consciousness and headaches.   Endo/Heme/Allergies: Negative for polydipsia.  Psychiatric/Behavioral: Negative for depression and suicidal ideas. The patient is not nervous/anxious and does not have insomnia.     Objective  BP 110/80  Pulse 64  Temp(Src) 98.1 F (36.7 C) (Oral)  Ht 5\' 5"  (1.651 m)  Wt 150 lb 1.3 oz (68.076 kg)  BMI 24.97 kg/m2  SpO2 95%  Physical Exam  Physical Exam  Constitutional: She is oriented to person, place, and time and well-developed, well-nourished, and in no distress. No distress.  HENT:  Head: Normocephalic and atraumatic.  Eyes: Conjunctivae are normal.  Neck: Neck supple. No thyromegaly present.  Cardiovascular: Normal rate, regular rhythm and normal heart sounds.   No murmur heard. Pulmonary/Chest: Effort normal and breath sounds normal. She has no wheezes.  Abdominal: She exhibits no distension and no mass.  Musculoskeletal: She exhibits no edema.  Lymphadenopathy:    She has no cervical adenopathy.  Neurological: She is alert and oriented to person, place, and time.  Skin: Skin is warm and dry. No rash noted. She is not diaphoretic.  Psychiatric: Memory, affect and judgment normal.    Lab Results  Component Value Date   TSH 1.649 08/07/2012   Lab Results  Component Value Date   WBC 7.0 10/02/2012   HGB 14.2 10/02/2012   HCT 41.8 10/02/2012   MCV 95.0 10/02/2012   PLT 235 10/02/2012   Lab Results  Component Value Date   CREATININE 0.86 10/02/2012   BUN 17 10/02/2012   NA 138 10/02/2012   K 4.1 10/02/2012   CL 106 10/02/2012   CO2 26 10/02/2012   Lab Results  Component Value Date   ALT 21 10/02/2012   AST 26 10/02/2012   ALKPHOS 75 10/02/2012   BILITOT 0.4 10/02/2012   Lab Results  Component Value Date   CHOL 209* 09/27/2011   Lab Results  Component Value Date   HDL 50 09/27/2011   Lab Results  Component Value Date   LDLCALC 143* 09/27/2011   Lab Results  Component Value Date   TRIG 79 09/27/2011   Lab Results  Component Value Date   CHOLHDL 4.2 09/27/2011      Assessment & Plan  BREAST CANCER, HX OF Was recently given abnormal results on imaging, they are awaiting preop clearance so they can proceed with excision of lesion  ANXIETY Will allow an increase in Alprazolam to help her handle the stress of her uncertain diagnosis  Edema Given refill on Lasix which she has used in past. Minimize sodium and elevate feet.  Hypothyroidism Well treated.  Left knee pain Pain is tolerable but is considering knee replacement

## 2013-02-11 NOTE — Assessment & Plan Note (Signed)
Was recently given abnormal results on imaging, they are awaiting preop clearance so they can proceed with excision of lesion

## 2013-02-11 NOTE — Assessment & Plan Note (Signed)
Well treated 

## 2013-02-13 ENCOUNTER — Telehealth (INDEPENDENT_AMBULATORY_CARE_PROVIDER_SITE_OTHER): Payer: Self-pay

## 2013-02-13 NOTE — Telephone Encounter (Signed)
Message copied by Maryan Puls on Tue Feb 13, 2013  2:39 PM ------      Message from: Glenna Fellows T      Created: Tue Feb 13, 2013 10:13 AM      Regarding: RE: Bayard Hugger, thanks      ----- Message -----         From: Maryan Puls, CMA         Sent: 02/12/2013   5:04 PM           To: Mariella Saa, MD      Subject: FYI                                                      Patient cardiac clearance came in while you were out of the office.  I went ahead and filled out a posting sheet and gave to the OR schedulers.  I have a copy for you to review when you're in clinic and placed a copy in medical records to be scanned into patient's chart.  They have scheduled patient for surgery on 02/20/13.            [name]       ------

## 2013-02-14 ENCOUNTER — Encounter (HOSPITAL_COMMUNITY): Payer: Self-pay | Admitting: Pharmacy Technician

## 2013-02-16 ENCOUNTER — Other Ambulatory Visit (INDEPENDENT_AMBULATORY_CARE_PROVIDER_SITE_OTHER): Payer: Self-pay | Admitting: General Surgery

## 2013-02-16 ENCOUNTER — Ambulatory Visit (HOSPITAL_COMMUNITY)
Admission: RE | Admit: 2013-02-16 | Discharge: 2013-02-16 | Disposition: A | Discharge status: Home / Self Care | Payer: Medicare Other | Source: Ambulatory Visit | Attending: Anesthesiology | Admitting: Anesthesiology

## 2013-02-16 ENCOUNTER — Encounter (HOSPITAL_COMMUNITY)
Admission: RE | Admit: 2013-02-16 | Discharge: 2013-02-16 | Disposition: A | Discharge status: Home / Self Care | Payer: Medicare Other | Source: Ambulatory Visit | Attending: General Surgery | Admitting: General Surgery

## 2013-02-16 ENCOUNTER — Encounter (HOSPITAL_COMMUNITY): Payer: Self-pay

## 2013-02-16 DIAGNOSIS — Z01812 Encounter for preprocedural laboratory examination: Secondary | ICD-10-CM | POA: Insufficient documentation

## 2013-02-16 DIAGNOSIS — Z01818 Encounter for other preprocedural examination: Secondary | ICD-10-CM | POA: Insufficient documentation

## 2013-02-16 LAB — CBC
HCT: 41.1 % (ref 36.0–46.0)
Hemoglobin: 14.5 g/dL (ref 12.0–15.0)
MCH: 33.2 pg (ref 26.0–34.0)
MCHC: 35.3 g/dL (ref 30.0–36.0)
MCV: 94.1 fL (ref 78.0–100.0)
Platelets: 202 10*3/uL (ref 150–400)
RBC: 4.37 MIL/uL (ref 3.87–5.11)
RDW: 12.1 % (ref 11.5–15.5)
WBC: 5.8 10*3/uL (ref 4.0–10.5)

## 2013-02-16 LAB — BASIC METABOLIC PANEL
BUN: 20 mg/dL (ref 6–23)
CO2: 27 mEq/L (ref 19–32)
Calcium: 9 mg/dL (ref 8.4–10.5)
Chloride: 106 mEq/L (ref 96–112)
Creatinine, Ser: 0.87 mg/dL (ref 0.50–1.10)
GFR calc Af Amer: 85 mL/min — ABNORMAL LOW (ref >90)
GFR calc non Af Amer: 74 mL/min — ABNORMAL LOW (ref >90)
Glucose, Bld: 93 mg/dL (ref 70–99)
Potassium: 4.7 mEq/L (ref 3.5–5.1)
Sodium: 140 mEq/L (ref 135–145)

## 2013-02-16 NOTE — Pre-Procedure Instructions (Addendum)
[name]  02/16/2013   Your procedure is scheduled on:  02/20/13  Report to [facility] a 530AM.  Call this number if you have problems the morning of surgery: [phone]   Remember:   Do not eat food or drink liquids after midnight.   Take these medicines the morning of surgery with A SIP OF WATER:xanax, wellbutrin, pain med, levothyroxine               STOP ibuprofen now   Do not wear jewelry, make-up or nail polish.  Do not wear lotions, powders, or perfumes. You may wear deodorant.  Do not shave 48 hours prior to surgery. Men may shave face and neck.  Do not bring valuables to the hospital.  [facility] is not responsible                   for any belongings or valuables.  Contacts, dentures or bridgework may not be worn into surgery.  Leave suitcase in the car. After surgery it may be brought to your room.  For patients admitted to the hospital, checkout time is 11:00 AM the day of  discharge.   Patients discharged the day of surgery will not be allowed to drive  home.  Name and phone number of your driver:  Special Instructions: Shower using CHG 2 nights before surgery and the night before surgery.  If you shower the day of surgery use CHG.  Use special wash - you have one bottle of CHG for all showers.  You should use approximately 1/3 of the bottle for each shower.   Please read over the following fact sheets that you were given: Pain Booklet, Coughing and Deep Breathing and Surgical Site Infection Prevention

## 2013-02-16 NOTE — Progress Notes (Addendum)
No orders , reqd 02/15/13,  Echo 6/12, stress 4/14, cath 7/12, card clearance note 02/09/13 epic in notes

## 2013-02-19 MED ORDER — CEFAZOLIN SODIUM-DEXTROSE 2-3 GM-% IV SOLR
2.0000 g | INTRAVENOUS | Status: AC
  Administered 2013-02-20: 2 g via INTRAVENOUS
  Filled 2013-02-19: qty 50

## 2013-02-20 ENCOUNTER — Ambulatory Visit (HOSPITAL_COMMUNITY): Payer: Medicare Other | Admitting: Certified Registered"

## 2013-02-20 ENCOUNTER — Encounter (HOSPITAL_COMMUNITY): Payer: Self-pay | Admitting: Certified Registered"

## 2013-02-20 ENCOUNTER — Ambulatory Visit (HOSPITAL_COMMUNITY)
Admission: RE | Admit: 2013-02-20 | Discharge: 2013-02-20 | Disposition: A | Discharge status: Home / Self Care | Payer: Medicare Other | Source: Ambulatory Visit | Attending: General Surgery | Admitting: General Surgery

## 2013-02-20 ENCOUNTER — Encounter (HOSPITAL_COMMUNITY)
Admission: RE | Disposition: A | Discharge status: Home / Self Care | Payer: Self-pay | Source: Ambulatory Visit | Attending: General Surgery

## 2013-02-20 ENCOUNTER — Encounter (HOSPITAL_COMMUNITY): Payer: Self-pay | Admitting: *Deleted

## 2013-02-20 DIAGNOSIS — N6452 Nipple discharge: Secondary | ICD-10-CM

## 2013-02-20 DIAGNOSIS — N6459 Other signs and symptoms in breast: Secondary | ICD-10-CM

## 2013-02-20 DIAGNOSIS — N6019 Diffuse cystic mastopathy of unspecified breast: Secondary | ICD-10-CM

## 2013-02-20 DIAGNOSIS — Z853 Personal history of malignant neoplasm of breast: Secondary | ICD-10-CM

## 2013-02-20 DIAGNOSIS — Z79899 Other long term (current) drug therapy: Secondary | ICD-10-CM | POA: Insufficient documentation

## 2013-02-20 HISTORY — PX: BREAST BIOPSY: SHX20

## 2013-02-20 SURGERY — BREAST BIOPSY
Anesthesia: General | Site: Breast | Laterality: Left | Wound class: Clean

## 2013-02-20 MED ORDER — CHLORHEXIDINE GLUCONATE 4 % EX LIQD: 1.0000 "application " | Freq: Once | CUTANEOUS | Status: DC

## 2013-02-20 MED ORDER — ONDANSETRON HCL 4 MG/2ML IJ SOLN
INTRAMUSCULAR | Status: DC | PRN
  Administered 2013-02-20: 4 mg via INTRAVENOUS

## 2013-02-20 MED ORDER — MIDAZOLAM HCL 5 MG/5ML IJ SOLN
INTRAMUSCULAR | Status: DC | PRN
  Administered 2013-02-20: 2 mg via INTRAVENOUS

## 2013-02-20 MED ORDER — BUPIVACAINE HCL (PF) 0.25 % IJ SOLN
INTRAMUSCULAR | Status: AC
  Filled 2013-02-20: qty 30

## 2013-02-20 MED ORDER — BUPIVACAINE-EPINEPHRINE PF 0.25-1:200000 % IJ SOLN
INTRAMUSCULAR | Status: AC
  Filled 2013-02-20: qty 30

## 2013-02-20 MED ORDER — LACTATED RINGERS IV SOLN
INTRAVENOUS | Status: DC | PRN
  Administered 2013-02-20: 07:00:00 via INTRAVENOUS

## 2013-02-20 MED ORDER — HYDROMORPHONE HCL PF 1 MG/ML IJ SOLN
INTRAMUSCULAR | Status: AC
  Filled 2013-02-20: qty 1

## 2013-02-20 MED ORDER — HYDROCODONE-ACETAMINOPHEN 5-325 MG PO TABS
ORAL_TABLET | ORAL | Status: AC
  Administered 2013-02-20: 1
  Filled 2013-02-20: qty 1

## 2013-02-20 MED ORDER — ARTIFICIAL TEARS OP OINT
TOPICAL_OINTMENT | OPHTHALMIC | Status: DC | PRN
  Administered 2013-02-20: 1 via OPHTHALMIC

## 2013-02-20 MED ORDER — 0.9 % SODIUM CHLORIDE (POUR BTL) OPTIME
TOPICAL | Status: DC | PRN
  Administered 2013-02-20: 1000 mL

## 2013-02-20 MED ORDER — FENTANYL CITRATE 0.05 MG/ML IJ SOLN
INTRAMUSCULAR | Status: DC | PRN
  Administered 2013-02-20 (×2): 100 ug via INTRAVENOUS
  Administered 2013-02-20: 50 ug via INTRAVENOUS

## 2013-02-20 MED ORDER — PROPOFOL 10 MG/ML IV BOLUS
INTRAVENOUS | Status: DC | PRN
  Administered 2013-02-20: 170 mg via INTRAVENOUS

## 2013-02-20 MED ORDER — PROMETHAZINE HCL 25 MG/ML IJ SOLN: 6.2500 mg | INTRAMUSCULAR | Status: DC | PRN

## 2013-02-20 MED ORDER — LIDOCAINE HCL (PF) 1 % IJ SOLN
INTRAMUSCULAR | Status: AC
Start: 2013-02-20 — End: 2013-02-20
  Filled 2013-02-20: qty 30

## 2013-02-20 MED ORDER — SODIUM BICARBONATE 4 % IV SOLN
INTRAVENOUS | Status: AC
  Filled 2013-02-20: qty 5

## 2013-02-20 MED ORDER — LIDOCAINE HCL (CARDIAC) 20 MG/ML IV SOLN
INTRAVENOUS | Status: DC | PRN
  Administered 2013-02-20: 100 mg via INTRAVENOUS

## 2013-02-20 MED ORDER — BUPIVACAINE HCL (PF) 0.25 % IJ SOLN
INTRAMUSCULAR | Status: DC | PRN
  Administered 2013-02-20: 30 mL

## 2013-02-20 MED ORDER — HYDROCODONE-ACETAMINOPHEN 5-325 MG PO TABS: 1.0000 | ORAL_TABLET | ORAL | Status: DC | PRN

## 2013-02-20 MED ORDER — HYDROCODONE-ACETAMINOPHEN 5-325 MG PO TABS: 1.0000 | ORAL_TABLET | Freq: Four times a day (QID) | ORAL | Status: DC | PRN

## 2013-02-20 MED ORDER — HYDROMORPHONE HCL PF 1 MG/ML IJ SOLN
0.2500 mg | INTRAMUSCULAR | Status: DC | PRN
  Administered 2013-02-20 (×2): 0.5 mg via INTRAVENOUS

## 2013-02-20 SURGICAL SUPPLY — 54 items
ADH SKN CLS APL DERMABOND .7 (GAUZE/BANDAGES/DRESSINGS) ×1
APL SKNCLS STERI-STRIP NONHPOA (GAUZE/BANDAGES/DRESSINGS)
BENZOIN TINCTURE PRP APPL 2/3 (GAUZE/BANDAGES/DRESSINGS) IMPLANT
BINDER BREAST LRG (GAUZE/BANDAGES/DRESSINGS) ×2 IMPLANT
BINDER BREAST XLRG (GAUZE/BANDAGES/DRESSINGS) IMPLANT
BLADE SURG 15 STRL LF DISP TIS (BLADE) ×1 IMPLANT
BLADE SURG 15 STRL SS (BLADE) ×2
CANISTER SUCTION 2500CC (MISCELLANEOUS) IMPLANT
CHLORAPREP W/TINT 26ML (MISCELLANEOUS) ×2 IMPLANT
CLOTH BEACON ORANGE TIMEOUT ST (SAFETY) ×2 IMPLANT
CONT SPEC 4OZ CLIKSEAL STRL BL (MISCELLANEOUS) ×2 IMPLANT
COVER SURGICAL LIGHT HANDLE (MISCELLANEOUS) ×2 IMPLANT
DECANTER SPIKE VIAL GLASS SM (MISCELLANEOUS) ×2 IMPLANT
DERMABOND ADVANCED (GAUZE/BANDAGES/DRESSINGS) ×1
DERMABOND ADVANCED .7 DNX12 (GAUZE/BANDAGES/DRESSINGS) ×1 IMPLANT
DEVICE DUBIN SPECIMEN MAMMOGRA (MISCELLANEOUS) IMPLANT
DRAPE PED LAPAROTOMY (DRAPES) ×2 IMPLANT
DRAPE UTILITY 15X26 W/TAPE STR (DRAPE) ×4 IMPLANT
ELECT COATED BLADE 2.86 ST (ELECTRODE) ×2 IMPLANT
ELECT REM PT RETURN 9FT ADLT (ELECTROSURGICAL) ×2
ELECTRODE REM PT RTRN 9FT ADLT (ELECTROSURGICAL) ×1 IMPLANT
GAUZE SPONGE 4X4 16PLY XRAY LF (GAUZE/BANDAGES/DRESSINGS) ×2 IMPLANT
GLOVE BIO SURGEON STRL SZ8 (GLOVE) ×2 IMPLANT
GLOVE BIOGEL PI IND STRL 8 (GLOVE) ×2 IMPLANT
GLOVE BIOGEL PI INDICATOR 8 (GLOVE) ×2
GLOVE SS BIOGEL STRL SZ 7.5 (GLOVE) ×1 IMPLANT
GLOVE SUPERSENSE BIOGEL SZ 7.5 (GLOVE) ×1
GOWN STRL NON-REIN LRG LVL3 (GOWN DISPOSABLE) ×2 IMPLANT
GOWN STRL REIN XL XLG (GOWN DISPOSABLE) ×2 IMPLANT
KIT BASIN OR (CUSTOM PROCEDURE TRAY) ×2 IMPLANT
KIT MARKER MARGIN INK (KITS) IMPLANT
KIT ROOM TURNOVER OR (KITS) ×2 IMPLANT
NDL HYPO 25GX1X1/2 BEV (NEEDLE) ×1 IMPLANT
NEEDLE HYPO 25GX1X1/2 BEV (NEEDLE) ×2 IMPLANT
NS IRRIG 1000ML POUR BTL (IV SOLUTION) ×2 IMPLANT
PACK SURGICAL SETUP 50X90 (CUSTOM PROCEDURE TRAY) ×2 IMPLANT
PAD ARMBOARD 7.5X6 YLW CONV (MISCELLANEOUS) ×4 IMPLANT
PENCIL BUTTON HOLSTER BLD 10FT (ELECTRODE) ×2 IMPLANT
SPONGE GAUZE 4X4 12PLY (GAUZE/BANDAGES/DRESSINGS) IMPLANT
STRIP CLOSURE SKIN 1/2X4 (GAUZE/BANDAGES/DRESSINGS) IMPLANT
SUT MON AB 4-0 PC3 18 (SUTURE) ×2 IMPLANT
SUT SILK 2 0 (SUTURE) ×2
SUT SILK 2 0 SH (SUTURE) ×2 IMPLANT
SUT SILK 2-0 18XBRD TIE 12 (SUTURE) ×1 IMPLANT
SUT VIC AB 3-0 SH 27 (SUTURE)
SUT VIC AB 3-0 SH 27XBRD (SUTURE) IMPLANT
SUT VIC AB 3-0 SH 8-18 (SUTURE) ×2 IMPLANT
SYR BULB 3OZ (MISCELLANEOUS) ×2 IMPLANT
SYR CONTROL 10ML LL (SYRINGE) ×2 IMPLANT
TOWEL OR 17X24 6PK STRL BLUE (TOWEL DISPOSABLE) ×2 IMPLANT
TOWEL OR 17X26 10 PK STRL BLUE (TOWEL DISPOSABLE) ×2 IMPLANT
TUBE CONNECTING 12X1/4 (SUCTIONS) IMPLANT
WATER STERILE IRR 1000ML POUR (IV SOLUTION) IMPLANT
YANKAUER SUCT BULB TIP NO VENT (SUCTIONS) IMPLANT

## 2013-02-20 NOTE — Anesthesia Postprocedure Evaluation (Signed)
Anesthesia Post Note  Patient: [name]  Procedure(s) Performed: Procedure(s) (LRB): LEFT BREAST CENTRAL DUCT EXCISION (Left)  Anesthesia type: general  Patient location: PACU  Post pain: Pain level controlled  Post assessment: Patient's Cardiovascular Status Stable  Post vital signs: Reviewed and stable  Level of consciousness: sedated  Complications: No apparent anesthesia complications

## 2013-02-20 NOTE — Preoperative (Signed)
Beta Blockers   Reason not to administer Beta Blockers:Not Applicable 

## 2013-02-20 NOTE — Anesthesia Preprocedure Evaluation (Addendum)
Anesthesia Evaluation  Patient identified by MRN, date of birth, ID band Patient awake    Reviewed: Allergy & Precautions, H&P , NPO status , Patient's Chart, lab work & pertinent test results  Airway Mallampati: II TM Distance: >3 FB     Dental  (+) Teeth Intact and Dental Advisory Given   Pulmonary neg pulmonary ROS,    Pulmonary exam normal       Cardiovascular + CAD Rhythm:Regular Rate:Normal  Chemo induced cardiomyopathy ef 45%   Neuro/Psych  Headaches, Anxiety Depression negative neurological ROS     GI/Hepatic Neg liver ROS, hiatal hernia, GERD-  Medicated,  Endo/Other  Hypothyroidism   Renal/GU negative Renal ROS     Musculoskeletal   Abdominal   Peds  Hematology negative hematology ROS (+)   Anesthesia Other Findings   Reproductive/Obstetrics                          Anesthesia Physical Anesthesia Plan  ASA: III  Anesthesia Plan: General   Post-op Pain Management:    Induction: Intravenous  Airway Management Planned: LMA  Additional Equipment:   Intra-op Plan:   Post-operative Plan: Extubation in OR  Informed Consent: I have reviewed the patients History and Physical, chart, labs and discussed the procedure including the risks, benefits and alternatives for the proposed anesthesia with the patient or authorized representative who has indicated his/her understanding and acceptance.   Dental advisory given  Plan Discussed with: CRNA, Anesthesiologist and Surgeon  Anesthesia Plan Comments:         Anesthesia Quick Evaluation

## 2013-02-20 NOTE — Op Note (Signed)
Preoperative Diagnosis: left bloody nipple discharge   Postoprative Diagnosis: left bloody nipple discharge   Procedure: Procedure(s): LEFT BREAST CENTRAL DUCT EXCISION   Surgeon: Glenna Fellows T   Assistants: none  Anesthesia:  General LMA anesthesia  Indications: patient is a 57 year old female with a personal history of breast cancer who presents with persistent bloody spontaneous left nipple discharge. Imaging has revealed dilated ducts immediately behind the nipple. After discussion of options and nature and risks are detailed extensively elsewhere we elect to proceed with central duct excision of the left breast.    Procedure Detail:  Patient was brought to the operating room, placed in the supine position on the operating table, and laryngeal mask general anesthesia induced. She received preoperative IV antibiotics. PAS ring placed. The left breast was widely sterilely prepped and draped. Patient time out was performed and correct procedure verified. The patient had a previous lateral circumareolar incision from a previous breast biopsy and I used this incision extending it slightly in either direction. Dissection was carried down to the subcutaneous tissue. The nipple/areolar skin was elevated with skin hooks and a carefully bluntly dissected around the central ducts directly behind the nipple and encircled these with a silk tie. The central ducts were then sharply dissected off of the back of the nipple skin. Following this the central ducts were excised down into the breast for about 2-1/2 cm with cautery and removed. There were noted to be several very dilated ducts with cysts and apparent fibrocystic disease. The soft tissue was infiltrated with Marcaine. The wound is irrigated and complete hemostasis obtained. The breast the subcutaneous tissue closed with interrupted 3-0 Vicryl and the skin closed with subcuticular 4-0 Monocryl and Dermabond. Sponge needle and instrument counts  were correct.    Findings: As above  Estimated Blood Loss:  Minimal         Drains: none  Blood Given: none          Specimens: left breast the central ducts        Complications:  * No complications entered in OR log *         Disposition: PACU - hemodynamically stable.         Condition: stable

## 2013-02-20 NOTE — Interval H&P Note (Signed)
History and Physical Interval Note:  02/20/2013 7:31 AM  [name]  has presented today for surgery, with the diagnosis of left bloody nipple discharge   The various methods of treatment have been discussed with the patient and family. After consideration of risks, benefits and other options for treatment, the patient has consented to  Procedure(s): LEFT BREAST CENTRAL DUCT EXCISION (Left) as a surgical intervention .  The patient's history has been reviewed, patient examined, no change in status, stable for surgery.  I have reviewed the patient's chart and labs.  Questions were answered to the patient's satisfaction.     Kelly Eisler T

## 2013-02-20 NOTE — H&P (View-Only) (Signed)
Chief complaint:  Left nipple discharge and personal history of breast cancer  History: Patient returns to the office. She is well known to me from a previous history of stage III cancer of the right breast with no primary identified, status post right axillary dissection and radiation to the right breast and subsequent high dose chemotherapy and bone marrow transplant at [facility]. This was 67 and she has had no evidence of recurrent cancer on long-term followup. She also has chronic fibrocystic breast disease and has had bilateral benign breast biopsies with findings of fibrocystic disease. She comes to the office today due to persistent left nipple discharge. On review of her records she has actually had intermittent greenish discharge noted from the left breast for a number of years. More recently she feels this has been increased. She has noted what appears to be bloody spots in her bra intermittently. She recently presented for routine imaging at the breast center. I've reviewed her images. Mammogram was unremarkable. Ultrasound showed some dilated ducts immediately behind the left nipple without mass. There was a small cluster of microcysts away from the nipple. She has not noted any lumps. She has occasional breast pain which is chronic.  Past Medical History  Diagnosis Date  . Allergic rhinitis   . Tremor   . Edema of lower extremity   . Neuropathy   . Hypothyroidism   . Anxiety   . Depression   . History of migraine headaches     only one in the past  . Breast cancer   . Nonischemic cardiomyopathy     echocardiogram done 6/12 demonstrated reduced LV function with an EF of 45-50% and grade 1 diastolic dysfunction  . Congenital anomaly of superior vena cava     R Ht cath 7/12:  NL pressures, no pulmonary hypertension and normal wedge pressure; probable congenital anomaly with at least a left sided SVC going into the coronary sinus.  Right-sided sats were somewhat elevated, although they seem  to be elevated uniformly from the PA, RA and RV.  consider bubble study from left arm or cardiac MRI or CT  . H/O hiatal hernia     found during endoscopy in 2012  . Arthritis     hip, knees, feet, ankles  . Depression with anxiety 12/28/2010  . CAD (coronary artery disease)     Nonobstructive by cath 7/12:  50% proximal LAD  . Dyspnea     when over exerted  . Pneumonia unknown  . Postmenopausal bleeding 08/23/2012  . Esophageal reflux 10/04/2012  . Vaginitis and vulvovaginitis 10/21/2012  . Recurrent oral herpes simplex 10/21/2012  . Unspecified constipation 11/12/2012   Past Surgical History  Procedure Laterality Date  . Bone marrow transplant  01/95  . Cervical conization w/bx    . Arthoscopy      rt. knee  . Axillary node dissection    . Bone marrow harvest    . Port-a-cath removed    . Hickman placed    . Bone marrow transplant    . Biopsy breast      L & R  . Combined hysteroscopy diagnostic / d&c    . Colonoscopy    . Cardiac catheterization    . Mastectomy, partial  1994    right  . Hysteroscopy w/d&c N/A 08/23/2012    Procedure: DILATATION AND CURETTAGE /HYSTEROSCOPY;  Surgeon: Robley Fries, MD;  Location: [facility];  Service: Gynecology;  Laterality: N/A;  Removal of expelled essure coil  Current Outpatient Prescriptions  Medication Sig Dispense Refill  . ALPRAZolam (XANAX) 0.5 MG tablet Take 1 tablet (0.5 mg total) by mouth 3 (three) times daily as needed for sleep.  90 tablet  1  . buPROPion (WELLBUTRIN XL) 300 MG 24 hr tablet Take 1 tablet (300 mg total) by mouth daily.  30 tablet  3  . furosemide (LASIX) 40 MG tablet Take 40 mg by mouth daily as needed (fluid retention).       Marland Kitchen HYDROcodone-acetaminophen (NORCO) 10-325 MG per tablet Take 1 tablet by mouth every 6 (six) hours as needed for pain. For pain.      Marland Kitchen ibuprofen (MOTRIN IB) 200 MG tablet Take 3 tablets (600 mg total) by mouth every 8 (eight) hours as needed for pain.  20 tablet  0  . levothyroxine  (SYNTHROID, LEVOTHROID) 75 MCG tablet Take 1 tablet (75 mcg total) by mouth daily.  30 tablet  3  . traZODone (DESYREL) 50 MG tablet Take 1 tablet (50 mg total) by mouth at bedtime.  90 tablet  1  . hyoscyamine (LEVSIN SL) 0.125 MG SL tablet Place 1 tablet (0.125 mg total) under the tongue every 4 (four) hours as needed for cramping.  30 tablet  0  . methylPREDNISolone (MEDROL, PAK,) 4 MG tablet follow package directions  21 tablet  0  . montelukast (SINGULAIR) 10 MG tablet Take 1 tablet (10 mg total) by mouth at bedtime as needed.  30 tablet  3  . ondansetron (ZOFRAN-ODT) 8 MG disintegrating tablet Take 1 tablet (8 mg total) by mouth every 8 (eight) hours as needed for nausea.  20 tablet  0  . ranitidine (ZANTAC) 300 MG tablet Take 1 tablet (300 mg total) by mouth at bedtime.  30 tablet  2   No current facility-administered medications for this visit.   Allergies  Allergen Reactions  . Codeine   . Cymbalta (Duloxetine Hcl)     Personality changes  . Lamictal (Lamotrigine)   . Morphine And Related   . Sertraline Hcl    Exam: BP 114/70  Pulse 88  Resp 16  Ht 5' 5.5" (1.664 m)  Wt 152 lb 9.6 oz (69.219 kg)  BMI 25 kg/m2 General: Well-appearing in no distress Skin: No rash or infection HEENT: No palpable mass or thyromegaly. Lymph nodes: No cervical, supraclavicular or axillary nodes palpable Lungs: Clear equal breath sounds. Pectus excavatum. Breasts: Some mild tourniquet of the right breast post radiation. Well-healed incisions. No axillary masses on the right. No palpable masses in either breast. I am unable to express any nipple discharge today.  Assessment and plan: Personal history of breast cancer and fibrocystic disease as above. She appears to have somewhat worsening left nipple discharge which is subjectively bloody and ultrasound showing some dilated ducts although no mass. I discussed with the patient and her husband today that this very likely represents benign fibrocystic  disease. I think our options would include continued followup with repeat ultrasound in 6 months versus proceeding with a central duct excision. Understandably the patient is very concerned due to her personal history and she feels that she would like the confirmation of tissue examination. This would also alleviate her symptoms. Therefore after discussion including risks of anesthetic complications, bleeding, infection, we will plan to proceed with left breast central duct excision. She has had some cardiac issues recently with coronary artery disease and some anatomic abnormalities evaluated here and at [facility]. She will be seeing her cardiologist in 2 weeks and  will obtain clearance prior to proceeding with surgery.

## 2013-02-20 NOTE — Anesthesia Procedure Notes (Signed)
Procedure Name: LMA Insertion Date/Time: 02/20/2013 7:36 AM Performed by: Jefm Miles E Pre-anesthesia Checklist: Patient identified, Timeout performed, Emergency Drugs available, Suction available and Patient being monitored Patient Re-evaluated:Patient Re-evaluated prior to inductionOxygen Delivery Method: Circle system utilized Preoxygenation: Pre-oxygenation with 100% oxygen Intubation Type: IV induction Ventilation: Mask ventilation without difficulty LMA: LMA inserted LMA Size: 4.0 Number of attempts: 1 Placement Confirmation: positive ETCO2 and breath sounds checked- equal and bilateral Tube secured with: Tape Dental Injury: Teeth and Oropharynx as per pre-operative assessment

## 2013-02-20 NOTE — Transfer of Care (Signed)
Immediate Anesthesia Transfer of Care Note  Patient: [name]  Procedure(s) Performed: Procedure(s): LEFT BREAST CENTRAL DUCT EXCISION (Left)  Patient Location: PACU  Anesthesia Type:General  Level of Consciousness: awake, alert  and oriented  Airway & Oxygen Therapy: Patient Spontanous Breathing and Patient connected to nasal cannula oxygen  Post-op Assessment: Report given to PACU RN  Post vital signs: Reviewed and stable  Complications: No apparent anesthesia complications

## 2013-02-21 ENCOUNTER — Encounter (HOSPITAL_COMMUNITY): Payer: Self-pay | Admitting: General Surgery

## 2013-02-21 ENCOUNTER — Telehealth (INDEPENDENT_AMBULATORY_CARE_PROVIDER_SITE_OTHER): Payer: Self-pay | Admitting: General Surgery

## 2013-02-21 NOTE — Telephone Encounter (Signed)
Called the patient and discussed path report  

## 2013-02-27 ENCOUNTER — Telehealth (INDEPENDENT_AMBULATORY_CARE_PROVIDER_SITE_OTHER): Payer: Self-pay

## 2013-02-27 ENCOUNTER — Ambulatory Visit: Payer: Medicare Other | Admitting: Cardiology

## 2013-02-27 NOTE — Telephone Encounter (Signed)
Patient is concerned due to the glue coming off  Breast incision site, Expressed this was normal to keep area clean / dry to call if any temp 100.3 , serous drainage,redness,odor at site. She states she does not have Post op appt I did not see a opening next week please advise

## 2013-02-27 NOTE — Telephone Encounter (Signed)
Called and spoke to patient regarding her follow up appointment scheduled for 9/147/14 @ 4:45 pm w/Dr. Johna Sheriff.  Patient reports that she's feeling weak and fatigued.  Patient offered appointment to come in this week for further assessment but, patient declined and will keep appointment on 03/14/13.  Patient advised to call our office if she develops any redness, swelling, drainage or fevers.  Patient verbalized understanding.

## 2013-03-01 ENCOUNTER — Ambulatory Visit (INDEPENDENT_AMBULATORY_CARE_PROVIDER_SITE_OTHER): Payer: Medicare Other | Admitting: General Surgery

## 2013-03-01 ENCOUNTER — Other Ambulatory Visit (INDEPENDENT_AMBULATORY_CARE_PROVIDER_SITE_OTHER): Payer: Self-pay | Admitting: General Surgery

## 2013-03-01 DIAGNOSIS — Z09 Encounter for follow-up examination after completed treatment for conditions other than malignant neoplasm: Secondary | ICD-10-CM

## 2013-03-01 DIAGNOSIS — R5381 Other malaise: Secondary | ICD-10-CM

## 2013-03-01 LAB — CBC
HCT: 39 % (ref 36.0–46.0)
Hemoglobin: 13.5 g/dL (ref 12.0–15.0)
MCH: 31.2 pg (ref 26.0–34.0)
MCHC: 34.6 g/dL (ref 30.0–36.0)
MCV: 90.1 fL (ref 78.0–100.0)
Platelets: 226 10*3/uL (ref 150–400)
RBC: 4.33 MIL/uL (ref 3.87–5.11)
RDW: 13.1 % (ref 11.5–15.5)
WBC: 5.1 10*3/uL (ref 4.0–10.5)

## 2013-03-01 NOTE — Progress Notes (Signed)
History: Patient returns following left breast central duct excision for persistent nipple discharge or personal history of breast cancer. She comes total early for her appointment due to complaint of severe fatigue since surgery. She states she's had extremely Little energy and has had trouble even walking through her house. She had part of the glue dressing come off near the nipple some bloody discharge a few days ago. She is concerned about her general fatigue.  Exam: Vital signs all within normal limits General: She does appear somewhat fatigued but no acute distress Incision: Left breast incision healing nicely without hematoma or infection or other complications   Pathology: We have previously discussed revealed only fibrocystic changes with dilated ducts  Assessment and plan: Status post central duct excision on the left. Healing well without complication. I told her that some fatigue was typical following surgery and general anesthesia but her as well as somewhat out of the norm. She thinks she is feeling a little better. I told her if this persists or she feels any worse she need to contact her primary physician her cardiologist immediately. She is comfortable with this. She is doing well enough that I did not give her a routine follow up appointment. We will check a CBC and I will call her with the results.

## 2013-03-06 ENCOUNTER — Telehealth (INDEPENDENT_AMBULATORY_CARE_PROVIDER_SITE_OTHER): Payer: Self-pay

## 2013-03-06 ENCOUNTER — Encounter (INDEPENDENT_AMBULATORY_CARE_PROVIDER_SITE_OTHER): Payer: Self-pay

## 2013-03-06 NOTE — Telephone Encounter (Signed)
Patient calling into office today to report that she's still having fatigue.  Patient would like to try to move forward with daily exercises such as recumbent bike but wasn't sure if she was released from recent surgery.  Patient states she's planning to have arthroscopy on her knee in October and wasn't sure if this was too soon for anesthesia.  Patient aware that I will review with Dr. Johna Sheriff and call her back.  Patient having trouble accessing her MyChart for pathology and lab results and requested them to be mailed.  Patient was sent a Test message through MyChart to see if she's able to view the message.

## 2013-03-07 ENCOUNTER — Telehealth (INDEPENDENT_AMBULATORY_CARE_PROVIDER_SITE_OTHER): Payer: Self-pay

## 2013-03-07 NOTE — Telephone Encounter (Signed)
Message copied by Maryan Puls on Wed Mar 07, 2013  5:06 PM ------      Message from: Glenna Fellows T      Created: Tue Mar 06, 2013 10:31 AM       No activity limitations. No contraindications to her knee surgery. ------

## 2013-03-07 NOTE — Telephone Encounter (Signed)
Called patient and made her aware that she can exercise and there's no activity limitations.  Patient was also inquiring about knee surgery and per Dr. Johna Sheriff he felt that she may proceed to surgery and did not see any contraindications to her knee surgery.  Patient also reports that she rec'd my TEST message via MyChart and is now able to use MyChart.  Patient verbalized understanding and agrees with above.

## 2013-03-07 NOTE — Telephone Encounter (Signed)
Per patient request a copy of pathology report, lab results and operative note have been mailed to patient home address listed in EPIC.

## 2013-03-14 ENCOUNTER — Encounter (INDEPENDENT_AMBULATORY_CARE_PROVIDER_SITE_OTHER): Payer: Medicare Other | Admitting: General Surgery

## 2013-03-16 ENCOUNTER — Other Ambulatory Visit: Payer: Self-pay | Admitting: Family Medicine

## 2013-03-19 NOTE — Telephone Encounter (Signed)
RX faxed

## 2013-03-19 NOTE — Telephone Encounter (Signed)
Please advise refill?   If ok fax to [phone]

## 2013-03-20 ENCOUNTER — Encounter: Payer: Self-pay | Admitting: Family Medicine

## 2013-03-20 ENCOUNTER — Ambulatory Visit (HOSPITAL_BASED_OUTPATIENT_CLINIC_OR_DEPARTMENT_OTHER)
Admission: RE | Admit: 2013-03-20 | Discharge: 2013-03-20 | Disposition: A | Discharge status: Home / Self Care | Payer: Medicare Other | Source: Ambulatory Visit | Attending: Family Medicine | Admitting: Family Medicine

## 2013-03-20 ENCOUNTER — Ambulatory Visit (INDEPENDENT_AMBULATORY_CARE_PROVIDER_SITE_OTHER): Payer: Medicare Other | Admitting: Family Medicine

## 2013-03-20 VITALS — BP 104/64 | HR 85 | Temp 98.5°F | Ht 65.0 in | Wt 149.1 lb

## 2013-03-20 DIAGNOSIS — E039 Hypothyroidism, unspecified: Secondary | ICD-10-CM

## 2013-03-20 DIAGNOSIS — E0590 Thyrotoxicosis, unspecified without thyrotoxic crisis or storm: Secondary | ICD-10-CM | POA: Insufficient documentation

## 2013-03-20 DIAGNOSIS — Z853 Personal history of malignant neoplasm of breast: Secondary | ICD-10-CM

## 2013-03-20 DIAGNOSIS — Z23 Encounter for immunization: Secondary | ICD-10-CM

## 2013-03-20 DIAGNOSIS — S0300XA Dislocation of jaw, unspecified side, initial encounter: Secondary | ICD-10-CM | POA: Insufficient documentation

## 2013-03-20 DIAGNOSIS — E010 Iodine-deficiency related diffuse (endemic) goiter: Secondary | ICD-10-CM

## 2013-03-20 DIAGNOSIS — E049 Nontoxic goiter, unspecified: Secondary | ICD-10-CM

## 2013-03-20 DIAGNOSIS — M25551 Pain in right hip: Secondary | ICD-10-CM

## 2013-03-20 DIAGNOSIS — M25559 Pain in unspecified hip: Secondary | ICD-10-CM

## 2013-03-20 DIAGNOSIS — F341 Dysthymic disorder: Secondary | ICD-10-CM

## 2013-03-20 DIAGNOSIS — F418 Other specified anxiety disorders: Secondary | ICD-10-CM

## 2013-03-20 LAB — T4, FREE: Free T4: 1.42 ng/dL (ref 0.80–1.80)

## 2013-03-20 LAB — TSH: TSH: 1.213 u[IU]/mL (ref 0.350–4.500)

## 2013-03-20 MED ORDER — ALPRAZOLAM 0.5 MG PO TABS: 0.5000 mg | ORAL_TABLET | Freq: Three times a day (TID) | ORAL | Status: DC | PRN

## 2013-03-20 MED ORDER — ESCITALOPRAM OXALATE 10 MG PO TABS: 10.0000 mg | ORAL_TABLET | Freq: Every day | ORAL | Status: DC

## 2013-03-20 MED ORDER — BUPROPION HCL ER (XL) 150 MG PO TB24: 150.0000 mg | ORAL_TABLET | Freq: Every day | ORAL | Status: DC

## 2013-03-20 MED ORDER — BUPROPION HCL ER (XL) 300 MG PO TB24: 300.0000 mg | ORAL_TABLET | Freq: Every day | ORAL | Status: DC

## 2013-03-20 NOTE — Progress Notes (Signed)
Patient ID: [name], female   DOB: [birth year], 56 y.o.   MRN: [id number] SIRA ADSIT [id number] 06/23/1956 03/20/2013      Progress Note-Follow Up  Subjective  Chief Complaint  No chief complaint on file.   HPI  Patient is a 57 year old Caucasian female in today to discuss numerous concerns. Should her recent scare with breast cancer again. They found a tumor but slightly and turned out to be benign. Technologist is increased her stress and she's been struggling with worsening anxiety and depression as a result. She's also concerned that her thyroid feels slightly swollen and she has been having excessive fatigue and anxiety. She denies chest pain or palpitations. No shortness of breath or dysphasia. No fevers, GI or GU concerns.  Past Medical History  Diagnosis Date  . Allergic rhinitis   . Tremor   . Edema of lower extremity   . Neuropathy   . Hypothyroidism   . Anxiety   . Depression   . History of migraine headaches     only one in the past  . Breast cancer   . Nonischemic cardiomyopathy     echocardiogram done 6/12 demonstrated reduced LV function with an EF of 45-50% and grade 1 diastolic dysfunction  . Congenital anomaly of superior vena cava     R Ht cath 7/12:  NL pressures, no pulmonary hypertension and normal wedge pressure; probable congenital anomaly with at least a left sided SVC going into the coronary sinus.  Right-sided sats were somewhat elevated, although they seem to be elevated uniformly from the PA, RA and RV.  consider bubble study from left arm or cardiac MRI or CT  . H/O hiatal hernia     found during endoscopy in 2012  . Arthritis     hip, knees, feet, ankles  . Depression with anxiety 12/28/2010  . CAD (coronary artery disease)     Nonobstructive by cath 7/12:  50% proximal LAD  . Dyspnea     when over exerted  . Postmenopausal bleeding 08/23/2012  . Esophageal reflux 10/04/2012  . Vaginitis and vulvovaginitis 10/21/2012  . Recurrent oral herpes  simplex 10/21/2012  . Unspecified constipation 11/12/2012  . Edema 02/11/2013  . Left knee pain 02/11/2013  . Pneumonia unknown    hx  . NWGNFAOZ(308.6)     Past Surgical History  Procedure Laterality Date  . Bone marrow transplant  01/95  . Cervical conization w/bx    . Arthoscopy      rt. knee  . Axillary node dissection Right 94  . Bone marrow harvest    . Port-a-cath removed    . Hickman placed    . Bone marrow transplant    . Biopsy breast      L & R  . Combined hysteroscopy diagnostic / d&c    . Colonoscopy    . Cardiac catheterization    . Hysteroscopy w/d&c N/A 08/23/2012    Procedure: DILATATION AND CURETTAGE /HYSTEROSCOPY;  Surgeon: Robley Fries, MD;  Location: [facility];  Service: Gynecology;  Laterality: N/A;  Removal of expelled essure coil  . Mastectomy, partial  1994    right  . Breast biopsy Left 02/20/2013    Procedure: LEFT BREAST CENTRAL DUCT EXCISION;  Surgeon: Mariella Saa, MD;  Location: [facility];  Service: General;  Laterality: Left;    Family History  Problem Relation Age of Onset  . COPD Mother   . Breast cancer Maternal Grandmother   . Tuberculosis Maternal Grandmother   .  Stroke Maternal Grandmother   . Allergies Father   . Heart disease Father   . Stroke Father   . Heart attack Father   . Allergies Sister   . Deep vein thrombosis Sister     History   Social History  . Marital Status: Married    Spouse Name: N/A    Number of Children: N/A  . Years of Education: N/A   Occupational History  . homemaker    Social History Main Topics  . Smoking status: Never Smoker   . Smokeless tobacco: Never Used  . Alcohol Use: 0.6 oz/week    1 Glasses of wine per week     Comment: occasionally   . Drug Use: No  . Sexual Activity: Yes    Birth Control/ Protection: Post-menopausal   Other Topics Concern  . Not on file   Social History Narrative   Lives in [place]   Has been married for 4 yerars.  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 ALPRAZolam (XANAX) 0.5 MG tablet take 1 tablet by mouth three times a day if needed  90 tablet  1  . buPROPion (WELLBUTRIN XL) 300 MG 24 hr tablet Take 300 mg by mouth daily.      . furosemide (LASIX) 40 MG tablet Take 40 mg by mouth daily as needed for fluid or edema.      Marland Kitchen HYDROcodone-acetaminophen (NORCO) 10-325 MG per tablet Take 1 tablet by mouth every 6 (six) hours as needed for pain.       Marland Kitchen HYDROcodone-acetaminophen (NORCO/VICODIN) 5-325 MG per tablet Take 1-2 tablets by mouth every 4 (four) hours as needed for pain.  30 tablet  1  . hyoscyamine (LEVSIN, ANASPAZ) 0.125 MG tablet Take 0.125 mg by mouth every 4 (four) hours as needed for cramping.      Marland Kitchen ibuprofen (ADVIL,MOTRIN) 200 MG tablet Take 600 mg by mouth every 6 (six) hours as needed for pain.      Marland Kitchen levothyroxine (SYNTHROID, LEVOTHROID) 75 MCG tablet Take 75 mcg by mouth daily before breakfast.      . traZODone (DESYREL) 50 MG tablet Take 1 tablet (50 mg total) by mouth at bedtime.  90 tablet  1   No current facility-administered medications on file prior to visit.    Allergies  Allergen Reactions  . Codeine   . Cymbalta [Duloxetine Hcl]     Personality changes  . Lamictal [Lamotrigine]   . Morphine And Related   . Sertraline Hcl     Review of Systems  Review of Systems  Constitutional: Negative for fever and malaise/fatigue.  HENT: Negative for congestion.   Eyes: Negative for discharge.  Respiratory: Negative for shortness of breath.   Cardiovascular: Negative for chest pain, palpitations and leg swelling.  Gastrointestinal: Negative for nausea, abdominal pain and diarrhea.  Genitourinary: Negative for dysuria.  Musculoskeletal: Negative for falls.  Skin: Negative for rash.  Neurological: Negative for loss of consciousness and headaches.  Endo/Heme/Allergies: Negative  for polydipsia.  Psychiatric/Behavioral: Negative for depression and suicidal ideas. The patient is not nervous/anxious and does not have insomnia.     Objective  BP 104/64  Pulse 85  Temp(Src) 98.5 F (36.9 C) (Oral)  Ht 5\' 5"  (1.651 m)  Wt 149 lb 1.9 oz (67.64 kg)  BMI 24.81 kg/m2  SpO2 98%  Physical Exam  Physical Exam  Constitutional: She is oriented to person, place, and time and well-developed, well-nourished, and in no distress. No distress.  HENT:  Head: Normocephalic and atraumatic.  Eyes: Conjunctivae are normal.  Neck: Neck supple. Thyromegaly present.  Cardiovascular: Normal rate, regular rhythm and normal heart sounds.   No murmur heard. Pulmonary/Chest: Effort normal and breath sounds normal. She has no wheezes.  Abdominal: She exhibits no distension and no mass.  Musculoskeletal: She exhibits no edema.  Lymphadenopathy:    She has no cervical adenopathy.  Neurological: She is alert and oriented to person, place, and time.  Skin: Skin is warm and dry. No rash noted. She is not diaphoretic.  Psychiatric: Memory, affect and judgment normal.    Lab Results  Component Value Date   TSH 1.649 08/07/2012   Lab Results  Component Value Date   WBC 5.1 03/01/2013   HGB 13.5 03/01/2013   HCT 39.0 03/01/2013   MCV 90.1 03/01/2013   PLT 226 03/01/2013   Lab Results  Component Value Date   CREATININE 0.87 02/16/2013   BUN 20 02/16/2013   NA 140 02/16/2013   K 4.7 02/16/2013   CL 106 02/16/2013   CO2 27 02/16/2013   Lab Results  Component Value Date   ALT 21 10/02/2012   AST 26 10/02/2012   ALKPHOS 75 10/02/2012   BILITOT 0.4 10/02/2012   Lab Results  Component Value Date   CHOL 209* 09/27/2011   Lab Results  Component Value Date   HDL 50 09/27/2011   Lab Results  Component Value Date   LDLCALC 143* 09/27/2011   Lab Results  Component Value Date   TRIG 79 09/27/2011   Lab Results  Component Value Date   CHOLHDL 4.2 09/27/2011     Assessment & Plan  Depression with  anxiety Struggling still will try dropping Wellbutrin XL to 150 mg daily. We will add Lexapro 10 mg daily. Is going to start counseling as well.  Right hip pain folllows closely with pain management. Is using hydrocodone and muscle relaxers sparingly.   BREAST CANCER, HX OF Had a recent scare with a lesion they were afraid was cancer but fortunately it was benign, did significantly increase her stress.   Thyromegaly htyroid ultrasound negative for significant enlargement,

## 2013-03-20 NOTE — Patient Instructions (Addendum)

## 2013-03-21 ENCOUNTER — Encounter: Payer: Self-pay | Admitting: Family Medicine

## 2013-03-21 DIAGNOSIS — E010 Iodine-deficiency related diffuse (endemic) goiter: Secondary | ICD-10-CM | POA: Insufficient documentation

## 2013-03-21 DIAGNOSIS — M25551 Pain in right hip: Secondary | ICD-10-CM | POA: Insufficient documentation

## 2013-03-21 NOTE — Assessment & Plan Note (Signed)
folllows closely with pain management. Is using hydrocodone and muscle relaxers sparingly.

## 2013-03-21 NOTE — Assessment & Plan Note (Signed)
Struggling still will try dropping Wellbutrin XL to 150 mg daily. We will add Lexapro 10 mg daily. Is going to start counseling as well.

## 2013-03-21 NOTE — Assessment & Plan Note (Signed)
Had a recent scare with a lesion they were afraid was cancer but fortunately it was benign, did significantly increase her stress.

## 2013-03-21 NOTE — Assessment & Plan Note (Signed)
htyroid ultrasound negative for significant enlargement,

## 2013-03-21 NOTE — Progress Notes (Signed)
Quick Note:  Patient Informed and voiced understanding ______ 

## 2013-04-09 ENCOUNTER — Ambulatory Visit (INDEPENDENT_AMBULATORY_CARE_PROVIDER_SITE_OTHER): Payer: Medicare Other | Admitting: Cardiology

## 2013-04-09 ENCOUNTER — Encounter: Payer: Self-pay | Admitting: Cardiology

## 2013-04-09 VITALS — BP 130/60 | HR 70 | Ht 65.0 in | Wt 151.0 lb

## 2013-04-09 DIAGNOSIS — I428 Other cardiomyopathies: Secondary | ICD-10-CM

## 2013-04-09 DIAGNOSIS — I2510 Atherosclerotic heart disease of native coronary artery without angina pectoris: Secondary | ICD-10-CM

## 2013-04-09 DIAGNOSIS — Q249 Congenital malformation of heart, unspecified: Secondary | ICD-10-CM

## 2013-04-09 NOTE — Patient Instructions (Signed)
The current medical regimen is effective;  continue present plan and medications.  Follow up in 1 year with Dr Hochrein.  You will receive a letter in the mail 2 months before you are due.  Please call us when you receive this letter to schedule your follow up appointment.  

## 2013-04-09 NOTE — Progress Notes (Signed)
HPI The patient presents for evaluaiton of nonischemic cardiomyopathy. This is thought to be related to chemotherapy inthe past for breast cancer. She has had a heart catheterization in 2012a. This demonstrated 50% LAD stenosis. Her EF was about 40-45% with some cavity dilatation. 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 This was confiremd by  Echo  There did not appear to be an ASD associated with this. She has normal right heart function and pressures.     Since I last saw her she was seen preoperatively for cardiology clearance prior to breast surgery. She could not get an appointment here. I was able to review an echocardiogram which actually suggested the EF was slightly better at about 50%.  She is somewhat limited by joint pains but she can still do water aerobics. The patient denies any new symptoms such as chest discomfort, neck or arm discomfort. There has been no new shortness of breath, PND or orthopnea. There have been no reported palpitations, presyncope or syncope.   She does get some dyspnea when she first starts exercising.   Allergies  Allergen Reactions  . Codeine   . Cymbalta [Duloxetine Hcl]     Personality changes  . Lamictal [Lamotrigine]   . Morphine And Related   . Sertraline Hcl     Current Outpatient Prescriptions  Medication Sig Dispense Refill  . ALPRAZolam (XANAX) 0.5 MG tablet Take 1 tablet (0.5 mg total) by mouth 3 (three) times daily as needed for sleep.  90 tablet  1  . buPROPion (WELLBUTRIN XL) 300 MG 24 hr tablet Take 1 tablet (300 mg total) by mouth daily. X 7 more days then stop when Lexapro goes to 10 mg daily      . furosemide (LASIX) 40 MG tablet Take 40 mg by mouth daily as needed for fluid or edema.      Marland Kitchen HYDROcodone-acetaminophen (NORCO) 10-325 MG per tablet Take 1 tablet by mouth every 6 (six) hours as needed for pain.       . hyoscyamine (LEVSIN, ANASPAZ) 0.125 MG tablet Take 0.125 mg by mouth every  4 (four) hours as needed for cramping.      Marland Kitchen ibuprofen (ADVIL,MOTRIN) 200 MG tablet Take 600 mg by mouth every 6 (six) hours as needed for pain.      Marland Kitchen levothyroxine (SYNTHROID, LEVOTHROID) 75 MCG tablet Take 75 mcg by mouth daily before breakfast.      . traZODone (DESYREL) 50 MG tablet Take 1 tablet (50 mg total) by mouth at bedtime.  90 tablet  1   No current facility-administered medications for this visit.    Past Medical History  Diagnosis Date  . Allergic rhinitis   . Tremor   . Neuropathy   . Hypothyroidism   . History of migraine headaches     only one in the past  . Breast cancer   . Nonischemic cardiomyopathy     EF 50% 2014 at [facility]  . Congenital anomaly of superior vena cava     R Ht cath 7/12:  NL pressures, no pulmonary hypertension and normal wedge pressure; probable congenital anomaly with at least a left sided SVC going into the coronary sinus.  Right-sided sats were somewhat elevated, although they seem to be elevated uniformly from the PA, RA and RV.  consider bubble study from left arm or cardiac MRI or CT  . H/O hiatal hernia     found during endoscopy in 2012  .  Arthritis     hip, knees, feet, ankles  . Depression with anxiety 12/28/2010  . CAD (coronary artery disease)     Nonobstructive by cath 7/12:  50% proximal LAD  . Postmenopausal bleeding 08/23/2012  . Vaginitis and vulvovaginitis 10/21/2012  . Left knee pain 02/11/2013  . Pneumonia unknown    hx  . TMJ (dislocation of temporomandibular joint) 03/20/2013  . Thyromegaly 03/21/2013    Past Surgical History  Procedure Laterality Date  . Bone marrow transplant  01/95  . Cervical conization w/bx    . Arthoscopy      rt. knee  . Axillary node dissection Right 94  . Port-a-cath removed    . Hickman placed    . Biopsy breast      L & R  . Combined hysteroscopy diagnostic / d&c    . Hysteroscopy w/d&c N/A 08/23/2012    Procedure: DILATATION AND CURETTAGE /HYSTEROSCOPY;  Surgeon: Robley Fries, MD;   Location: [facility];  Service: Gynecology;  Laterality: N/A;  Removal of expelled essure coil  . Mastectomy, partial  1994    right  . Breast biopsy Left 02/20/2013    Procedure: LEFT BREAST CENTRAL DUCT EXCISION;  Surgeon: Mariella Saa, MD;  Location: [facility];  Service: General;  Laterality: Left;    ROS:  As stated in the HPI and negative for all other systems.  PHYSICAL EXAM BP 130/60  Pulse 70  Ht 5\' 5"  (1.651 m)  Wt 151 lb (68.493 kg)  BMI 25.13 kg/m2 GENERAL:  Well appearing HEENT:  Pupils equal round and reactive, fundi not visualized, oral mucosa unremarkable NECK:  No jugular venous distention, waveform within normal limits, carotid upstroke brisk and symmetric, no bruits, no thyromegaly LYMPHATICS:  No cervical, inguinal adenopathy LUNGS:  Clear to auscultation bilaterally BACK:  No CVA tenderness CHEST:  Unremarkable HEART:  PMI not displaced or sustained,S1 and S2 within normal limits, no S3, no S4, no clicks, no rubs, no murmurs ABD:  Flat, positive bowel sounds normal in frequency in pitch, no bruits, no rebound, no guarding, no midline pulsatile mass, no hepatomegaly, no splenomegaly EXT:  2 plus pulses throughout, no edema, no cyanosis no clubbing   EKG:  Sinus rhythm, rate 70, axis within normal limits, intervals within normal limits, no acute ST-T wave changes.   ASSESSMENT AND PLAN  NONISCHEMIC CARDIOMYOPATHY:  Her EF by echo earlier this year was actually even better than before. No change in therapy is indicated.  ANOMALOUS SVC:  She has no symptoms related to this. No change in therapy is indicated.  CAD: this has been nonobstructive and there are no new symptoms. She will continue with primary risk reduction.

## 2013-04-10 ENCOUNTER — Ambulatory Visit: Payer: Medicare Other | Admitting: Family Medicine

## 2013-04-16 ENCOUNTER — Ambulatory Visit: Payer: Medicare Other | Admitting: Family Medicine

## 2013-05-20 ENCOUNTER — Other Ambulatory Visit: Payer: Self-pay | Admitting: Family Medicine

## 2013-05-21 NOTE — Telephone Encounter (Signed)
Please advise refill? Last RX was wrote on 11-21-12 quantity 90 with 1 refill

## 2013-06-01 ENCOUNTER — Other Ambulatory Visit: Payer: Self-pay | Admitting: Family Medicine

## 2013-07-02 ENCOUNTER — Encounter: Payer: Self-pay | Admitting: *Deleted

## 2013-07-02 NOTE — Progress Notes (Signed)
Received a request from [place] that this pt is wanting to see Dr. [name] since Dr. Tressie Stalker is no longer at [facility].  Took paperwork to Dr. [name] for an appt date and time. Spoke with pt to make her aware.

## 2013-07-03 ENCOUNTER — Other Ambulatory Visit: Payer: Self-pay | Admitting: Oncology

## 2013-07-05 ENCOUNTER — Telehealth: Payer: Self-pay | Admitting: *Deleted

## 2013-07-05 NOTE — Telephone Encounter (Signed)
Left message for pt to return my call so I can get her scheduled w/ Dr. [name].

## 2013-07-06 ENCOUNTER — Telehealth: Payer: Self-pay | Admitting: *Deleted

## 2013-07-06 ENCOUNTER — Other Ambulatory Visit: Payer: Self-pay | Admitting: Family Medicine

## 2013-07-06 NOTE — Telephone Encounter (Signed)
Pt returned my call and I confirmed 01/28/14 appt w/ pt.  Unable to schedule the lab appt - emailed Audie Clear to enter it for me.  Mailed before appt letter, welcome packet & intake form to pt.  Took paperwork to [facility] for chart.

## 2013-07-09 ENCOUNTER — Ambulatory Visit (HOSPITAL_COMMUNITY): Payer: Medicare Other

## 2013-07-10 ENCOUNTER — Telehealth: Payer: Self-pay | Admitting: Family Medicine

## 2013-07-10 NOTE — Telephone Encounter (Signed)
Needs a refill of her wellbutrin  She is out

## 2013-07-10 NOTE — Telephone Encounter (Signed)
She is still on Wellbutrin XL 150 mg po daily, disp #30 with 5 rf

## 2013-07-10 NOTE — Progress Notes (Signed)
This encounter was created in error - please disregard.

## 2013-07-10 NOTE — Telephone Encounter (Signed)
Please advise? It looks like pt only took this until she went up to 10mg  of Lexapro?

## 2013-07-11 MED ORDER — BUPROPION HCL ER (XL) 150 MG PO TB24: 150.0000 mg | ORAL_TABLET | Freq: Every day | ORAL | Status: DC

## 2013-07-11 NOTE — Telephone Encounter (Signed)
Rx sent. Pt aware.  

## 2013-07-11 NOTE — Telephone Encounter (Signed)
Patient called back regarding this. [facility] # [phone] and [facility] at [facility] [address].

## 2013-07-13 ENCOUNTER — Encounter: Payer: Self-pay | Admitting: *Deleted

## 2013-07-13 ENCOUNTER — Other Ambulatory Visit: Payer: Self-pay | Admitting: *Deleted

## 2013-07-13 DIAGNOSIS — Z853 Personal history of malignant neoplasm of breast: Secondary | ICD-10-CM | POA: Insufficient documentation

## 2013-07-13 NOTE — Progress Notes (Signed)
Received chart back from Dawn and placed in Dr. Magrinat's box.  

## 2013-07-13 NOTE — Progress Notes (Signed)
Completed chart and gave to Keisha to enter labs and return to me. 

## 2013-07-16 ENCOUNTER — Encounter: Payer: Self-pay | Admitting: Physician Assistant

## 2013-07-16 ENCOUNTER — Telehealth: Payer: Self-pay | Admitting: Family Medicine

## 2013-07-16 ENCOUNTER — Ambulatory Visit (INDEPENDENT_AMBULATORY_CARE_PROVIDER_SITE_OTHER): Payer: Medicare Other | Admitting: Physician Assistant

## 2013-07-16 VITALS — BP 114/78 | HR 95 | Temp 98.2°F | Resp 16 | Ht 65.0 in | Wt 151.0 lb

## 2013-07-16 DIAGNOSIS — R197 Diarrhea, unspecified: Secondary | ICD-10-CM

## 2013-07-16 LAB — CBC WITH DIFFERENTIAL/PLATELET
Basophils Absolute: 0.1 10*3/uL (ref 0.0–0.1)
Basophils Relative: 1 % (ref 0–1)
Eosinophils Absolute: 0.1 10*3/uL (ref 0.0–0.7)
Eosinophils Relative: 1 % (ref 0–5)
HCT: 42 % (ref 36.0–46.0)
Hemoglobin: 14.1 g/dL (ref 12.0–15.0)
Lymphocytes Relative: 32 % (ref 12–46)
Lymphs Abs: 2.1 10*3/uL (ref 0.7–4.0)
MCH: 31.4 pg (ref 26.0–34.0)
MCHC: 33.6 g/dL (ref 30.0–36.0)
MCV: 93.5 fL (ref 78.0–100.0)
Monocytes Absolute: 0.7 10*3/uL (ref 0.1–1.0)
Monocytes Relative: 11 % (ref 3–12)
Neutro Abs: 3.6 10*3/uL (ref 1.7–7.7)
Neutrophils Relative %: 55 % (ref 43–77)
Platelets: 252 10*3/uL (ref 150–400)
RBC: 4.49 MIL/uL (ref 3.87–5.11)
RDW: 13.3 % (ref 11.5–15.5)
WBC: 6.5 10*3/uL (ref 4.0–10.5)

## 2013-07-16 MED ORDER — ONDANSETRON HCL 4 MG PO TABS: 4.0000 mg | ORAL_TABLET | Freq: Three times a day (TID) | ORAL | Status: DC | PRN

## 2013-07-16 NOTE — Telephone Encounter (Signed)
Patient states that her dog has giardia and she has been exposed to this. Patient would like to know if Dr. Charlett Blake recommends she come here to her primary or call her GI doctor to be seen? [phone] if before 8:45 or [phone] after 8:45

## 2013-07-16 NOTE — Telephone Encounter (Signed)
This was an old message from last week per the pharmacy. The pt already picked up the 150 mg

## 2013-07-16 NOTE — Telephone Encounter (Signed)
Pt is scheduled to see Einar Pheasant

## 2013-07-16 NOTE — Telephone Encounter (Signed)
Clarification  Message from pharmacy:  Pt was taking the 300mg , not 150mg . Please send rx for 300mg .

## 2013-07-16 NOTE — Patient Instructions (Signed)
Please take 300 mg of the Wellbutrin for now.  Increase fluid intake.  Continue a fiber supplement.  Ginger capsules for nausea.  Can take zofran for severe nausea.  Make sure you are staying hydrated.  If you cannot tolerate liquids by mouth, you need to go to the ER for IV fluids.  I will call you with your results as soon as I have them.  We will start an antibiotic if needed.

## 2013-07-16 NOTE — Progress Notes (Signed)
Pre visit review using our clinic review tool, if applicable. No additional management support is needed unless otherwise documented below in the visit note/SLS  

## 2013-07-17 ENCOUNTER — Other Ambulatory Visit: Payer: Self-pay | Admitting: Physician Assistant

## 2013-07-17 LAB — COMPREHENSIVE METABOLIC PANEL
ALT: 20 U/L (ref 0–35)
AST: 28 U/L (ref 0–37)
Albumin: 4.4 g/dL (ref 3.5–5.2)
Alkaline Phosphatase: 89 U/L (ref 39–117)
BUN: 19 mg/dL (ref 6–23)
CO2: 28 mEq/L (ref 19–32)
Calcium: 8.9 mg/dL (ref 8.4–10.5)
Chloride: 102 mEq/L (ref 96–112)
Creat: 0.8 mg/dL (ref 0.50–1.10)
Glucose, Bld: 76 mg/dL (ref 70–99)
Potassium: 3.6 mEq/L (ref 3.5–5.3)
Sodium: 139 mEq/L (ref 135–145)
Total Bilirubin: 0.6 mg/dL (ref 0.3–1.2)
Total Protein: 6.8 g/dL (ref 6.0–8.3)

## 2013-07-18 LAB — FECAL LACTOFERRIN, QUANT: Lactoferrin: NEGATIVE

## 2013-07-18 LAB — OVA AND PARASITE SCREEN: OP: NONE SEEN

## 2013-07-19 DIAGNOSIS — R197 Diarrhea, unspecified: Secondary | ICD-10-CM | POA: Insufficient documentation

## 2013-07-19 NOTE — Assessment & Plan Note (Signed)
Physical exam not impressive. [name] is more than likely a result of her IBS, that is worse due to her anxiety over feeling she may be exposed to Giardia.  Will obtain CBC, BMP, stool culture, ova and parasite, and fecal lactoferrin. No recent antibiotic usage. Do not feel that his C. difficile assay is necessary at present time. Encouraged patient to make sure to stay well-hydrated. Increase fiber intake. Ginger or ginger capsules for nausea.

## 2013-07-19 NOTE — Progress Notes (Signed)
Patient presents to clinic today c/o a couple of days of nausea, loose stool, bloating and fatigue. Patient is concerned about Giardia infection, because her dog was recently treated for Giardia.  Patient denies recent travel or sick contact. Denies fever, chills, sweats. Patient has been tolerating by mouth fluids well. Denies abdominal pain, but does endorse occasional "gas" discomfort. Patient denies foreign water source. Patient states she does pick up after her dog and "could possibly touch her lips and contaminated herself with Giardia."  It should be noted that the patient suffers from irritable bowel syndrome as well as significant anxiety. Patient does state that this could be possibly due to a flareup of her IBS. Patient denies hematochezia, melena or tenesmus. Denies emesis.  Past Medical History  Diagnosis Date  . Allergic rhinitis   . Tremor   . Neuropathy   . Hypothyroidism   . History of migraine headaches     only one in the past  . Breast cancer   . Nonischemic cardiomyopathy     EF 50% 2014 at [facility]  . Congenital anomaly of superior vena cava     R Ht cath 7/12:  NL pressures, no pulmonary hypertension and normal wedge pressure; probable congenital anomaly with at least a left sided SVC going into the coronary sinus.  Right-sided sats were somewhat elevated, although they seem to be elevated uniformly from the PA, RA and RV.  consider bubble study from left arm or cardiac MRI or CT  . H/O hiatal hernia     found during endoscopy in 2012  . Arthritis     hip, knees, feet, ankles  . Depression with anxiety 12/28/2010  . CAD (coronary artery disease)     Nonobstructive by cath 7/12:  50% proximal LAD  . Postmenopausal bleeding 08/23/2012  . Vaginitis and vulvovaginitis 10/21/2012  . Left knee pain 02/11/2013  . Pneumonia unknown    hx  . TMJ (dislocation of temporomandibular joint) 03/20/2013  . Thyromegaly 03/21/2013    Current Outpatient Prescriptions on File Prior to Visit   Medication Sig Dispense Refill  . ALPRAZolam (XANAX) 0.5 MG tablet Take 1 tablet (0.5 mg total) by mouth 3 (three) times daily as needed for sleep.  90 tablet  1  . HYDROcodone-acetaminophen (NORCO) 10-325 MG per tablet Take 1 tablet by mouth every 6 (six) hours as needed for pain.       . hyoscyamine (LEVSIN, ANASPAZ) 0.125 MG tablet Take 0.125 mg by mouth every 4 (four) hours as needed for cramping.      Marland Kitchen ibuprofen (ADVIL,MOTRIN) 200 MG tablet Take 600 mg by mouth every 6 (six) hours as needed for pain.      Marland Kitchen levothyroxine (SYNTHROID, LEVOTHROID) 75 MCG tablet take 1 tablet by mouth once daily  30 tablet  3  . traZODone (DESYREL) 50 MG tablet take 1 tablet by mouth at bedtime  90 tablet  1  . buPROPion (WELLBUTRIN XL) 150 MG 24 hr tablet Take 1 tablet (150 mg total) by mouth daily.  30 tablet  5  . furosemide (LASIX) 40 MG tablet Take 40 mg by mouth daily as needed for fluid or edema.       No current facility-administered medications on file prior to visit.    Allergies  Allergen Reactions  . Codeine   . Cymbalta [Duloxetine Hcl]     Personality changes  . Lamictal [Lamotrigine]   . Morphine And Related   . Sertraline Hcl     Family  History  Problem Relation Age of Onset  . COPD Mother   . Breast cancer Maternal Grandmother   . Tuberculosis Maternal Grandmother   . Stroke Maternal Grandmother   . Allergies Father   . Heart disease Father   . Stroke Father   . Heart attack Father   . Allergies Sister   . Deep vein thrombosis Sister     History   Social History  . Marital Status: Married    Spouse Name: N/A    Number of Children: N/A  . Years of Education: N/A   Occupational History  . homemaker    Social History Main Topics  . Smoking status: Never Smoker   . Smokeless tobacco: Never Used  . Alcohol Use: 0.6 oz/week    1 Glasses of wine per week     Comment: occasionally   . Drug Use: No  . Sexual Activity: Yes    Birth Control/ Protection:  Post-menopausal   Other Topics Concern  . None   Social History Narrative   Lives in [place]   Has been married for 4 yerars.  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  All other ROS are negative.  Filed Vitals:   07/16/13 1602  BP: 114/78  Pulse: 95  Temp: 98.2 F (36.8 C)  Resp: 16   Physical Exam  Constitutional: She is well-developed, well-nourished, and in no distress.  HENT:  Head: Normocephalic and atraumatic.  Eyes: Conjunctivae are normal. Pupils are equal, round, and reactive to light.  Neck: Neck supple.  Cardiovascular: Normal rate, regular rhythm, normal heart sounds and intact distal pulses.   Pulmonary/Chest: Effort normal and breath sounds normal.  Abdominal: Soft. Bowel sounds are normal. She exhibits no distension and no mass. There is no tenderness. There is no rebound and no guarding.  Lymphadenopathy:    She has no cervical adenopathy.  Skin: Skin is warm and dry. No rash noted.  Psychiatric:  Anxious    Recent Results (from the past 2160 hour(s))  STOOL CULTURE     Status: None   Collection Time    07/16/13  2:46 PM      Result Value Range   Preliminary Report No Suspicious Colonies, Continuing to Hold    OVA AND PARASITE SCREEN     Status: None   Collection Time    07/16/13  2:49 PM      Result Value Range   OP No Ova or Parasites Seen     FECAL LACTOFERRIN     Status: None   Collection Time    07/16/13  4:09 PM      Result Value Range   Lactoferrin NEGATIVE    CBC WITH DIFFERENTIAL     Status: None   Collection Time    07/16/13  4:48 PM      Result Value Range   WBC 6.5  4.0 - 10.5 K/uL   RBC 4.49  3.87 - 5.11 MIL/uL   Hemoglobin 14.1  12.0 - 15.0 g/dL   HCT 42.0  36.0 - 46.0 %   MCV 93.5  78.0 - 100.0 fL   MCH 31.4  26.0 - 34.0 pg   MCHC 33.6  30.0 - 36.0 g/dL   RDW 13.3  11.5 - 15.5 %   Platelets 252  150 - 400 K/uL   Neutrophils Relative % 55  43 - 77 %  Neutro Abs 3.6  1.7 - 7.7 K/uL   Lymphocytes Relative 32  12 - 46 %   Lymphs Abs 2.1  0.7 - 4.0 K/uL   Monocytes Relative 11  3 - 12 %   Monocytes Absolute 0.7  0.1 - 1.0 K/uL   Eosinophils Relative 1  0 - 5 %   Eosinophils Absolute 0.1  0.0 - 0.7 K/uL   Basophils Relative 1  0 - 1 %   Basophils Absolute 0.1  0.0 - 0.1 K/uL   Smear Review Criteria for review not met    COMPREHENSIVE METABOLIC PANEL     Status: None   Collection Time    07/16/13  4:48 PM      Result Value Range   Sodium 139  135 - 145 mEq/L   Potassium 3.6  3.5 - 5.3 mEq/L   Chloride 102  96 - 112 mEq/L   CO2 28  19 - 32 mEq/L   Glucose, Bld 76  70 - 99 mg/dL   BUN 19  6 - 23 mg/dL   Creat 0.80  0.50 - 1.10 mg/dL   Total Bilirubin 0.6  0.3 - 1.2 mg/dL   Alkaline Phosphatase 89  39 - 117 U/L   AST 28  0 - 37 U/L   ALT 20  0 - 35 U/L   Total Protein 6.8  6.0 - 8.3 g/dL   Albumin 4.4  3.5 - 5.2 g/dL   Calcium 8.9  8.4 - 10.5 mg/dL    Assessment/Plan: No problem-specific assessment & plan notes found for this encounter.

## 2013-07-21 LAB — STOOL CULTURE

## 2013-07-23 ENCOUNTER — Telehealth: Payer: Self-pay | Admitting: Family Medicine

## 2013-07-23 NOTE — Telephone Encounter (Signed)
Wants a antibiotic called in for a sinus infection. Request to speak with nurse regarding buPROPion (WELLBUTRIN XL) 150 MG 24 hr tablet and ondansetron (ZOFRAN) 4 MG tablet refills, stating wrong dosage was called in.

## 2013-07-23 NOTE — Telephone Encounter (Signed)
Please call pt? Pt possibly needs an appt?

## 2013-07-23 NOTE — Telephone Encounter (Signed)
Wellbutrin dosage needs to be handled by Dr. Charlett Blake, the patient's PCP. There seems to be confusion on what she is supposed to be prescribed. Last note states that she should be on 150 of Wellbutrin and titrating up her other antidepressant.  Patient states she could not tolerate other medications and has been taking her original dose of 300 mg Wellbutrin daily. I will defer that to Dr. Charlett Blake.  I will not call in antibiotic for sinus infection.  Patient was seen last week for diarrhea.  No complaints of sinuses at that time.

## 2013-07-24 NOTE — Telephone Encounter (Signed)
So I am OK to let her keep taking the Wellbutrin XL 300 mg daily if that is what she went back to, can send in 30 d supply with 5  rf or 90 d with 1. As for the abx generally we do not prescribe them over the phone, the vast majority of respiratory infections this time of year are viral and they do not help and with the diarrhea we will just make that worse. Start Mucinex 600 mg bid x 10 days, zinc (coldeeze or 50 mg cap) daily and probiotics to help fight infection, increase rest and hydration and then schedule her an appt for some time in next week so if she is not improving we can treat

## 2013-07-24 NOTE — Telephone Encounter (Signed)
Please advise 

## 2013-07-27 ENCOUNTER — Telehealth: Payer: Self-pay | Admitting: Family Medicine

## 2013-07-27 MED ORDER — BUPROPION HCL ER (XL) 300 MG PO TB24: 300.0000 mg | ORAL_TABLET | Freq: Every day | ORAL | Status: DC

## 2013-07-27 NOTE — Telephone Encounter (Signed)
See phone note from Dr B on 07-23-2013  Stating she can have a refill of Wellbutrin hcl xl  300 mg.  She has been taking this dosage for 4 months.  Please call in 30 day supply with 5 refills as Dr B stated in her note.  Also she will be out in 2 days so please call in today to [facility]

## 2013-07-27 NOTE — Telephone Encounter (Signed)
Done look at other note

## 2013-08-27 ENCOUNTER — Other Ambulatory Visit: Payer: Self-pay | Admitting: Oncology

## 2013-08-31 ENCOUNTER — Telehealth: Payer: Self-pay | Admitting: Family Medicine

## 2013-08-31 MED ORDER — TRAZODONE 25 MG HALF TABLET: 25.0000 mg | ORAL_TABLET | Freq: Every day | ORAL | Status: DC

## 2013-08-31 NOTE — Telephone Encounter (Signed)
Send her in a Trazodone 25 mg 1 tab po qhs #30 with 1 rf, have her let us know how that works

## 2013-08-31 NOTE — Telephone Encounter (Signed)
She is on 50mg  of trazadone and needs to know how to taper down to 25mg .  She requests you leave a message on her home phone

## 2013-09-03 ENCOUNTER — Telehealth: Payer: Self-pay

## 2013-09-03 MED ORDER — TRAZODONE HCL 50 MG PO TABS: 25.0000 mg | ORAL_TABLET | Freq: Every day | ORAL | Status: DC

## 2013-09-03 NOTE — Telephone Encounter (Signed)
Yes switch to Trazodone 50 mg 1/2 tab po qhs, disp 30 day supply with 5 rf or 90 day supply with 1

## 2013-09-03 NOTE — Telephone Encounter (Signed)
[facility] called stating that there is no 25 mg Trazodone? They would like to know if they can switch this to 50 mg 1/2 tab daily?  Please advise?

## 2013-09-04 ENCOUNTER — Telehealth: Payer: Self-pay | Admitting: Family Medicine

## 2013-09-04 NOTE — Telephone Encounter (Signed)
Pt informed and voiced understanding

## 2013-09-04 NOTE — Telephone Encounter (Signed)
She can stop in about a month

## 2013-09-04 NOTE — Telephone Encounter (Signed)
Please advise 

## 2013-09-04 NOTE — Telephone Encounter (Signed)
SHE HAS SWITCHED TO THE 25mg  TRAZADONE.  hOW LONG DOES SHE NEED TO TAKE THAT BEFORE SHE CAN JUST STOP

## 2013-09-11 ENCOUNTER — Encounter: Payer: Self-pay | Admitting: Family

## 2013-09-11 ENCOUNTER — Ambulatory Visit (INDEPENDENT_AMBULATORY_CARE_PROVIDER_SITE_OTHER): Payer: Medicare Other | Admitting: Family

## 2013-09-11 VITALS — BP 100/80 | HR 94 | Temp 98.2°F | Wt 145.1 lb

## 2013-09-11 DIAGNOSIS — J329 Chronic sinusitis, unspecified: Secondary | ICD-10-CM

## 2013-09-11 MED ORDER — AMOXICILLIN 500 MG PO CAPS: 500.0000 mg | ORAL_CAPSULE | Freq: Three times a day (TID) | ORAL | Status: DC

## 2013-09-11 NOTE — Patient Instructions (Signed)
Start claritin 10 mg once daily. Start flonase 2 sprays to each nostril once daily. Start amoxicillin for sinus infection. Call if symptoms worsen or if not improved in 2-3 days.

## 2013-09-11 NOTE — Progress Notes (Signed)
Subjective:    Patient ID: [name], female    DOB: 1955/08/02, 58 y.o.   MRN: [id number]  HPI  [name] is a 58 yr old female who presents today with chief complaint of sinus congestion.  She reports green drainage, headache, chills, sore throat.  Reports sinus infection x 2 months.  + frontal and maxillay tenderness with associated tenderness. Has tried advil with some improvement.  Review of Systems See HPI  Past Medical History  Diagnosis Date  . Allergic rhinitis   . Tremor   . Neuropathy   . Hypothyroidism   . History of migraine headaches     only one in the past  . Breast cancer   . Nonischemic cardiomyopathy     EF 50% 2014 at [facility]  . Congenital anomaly of superior vena cava     R Ht cath 7/12:  NL pressures, no pulmonary hypertension and normal wedge pressure; probable congenital anomaly with at least a left sided SVC going into the coronary sinus.  Right-sided sats were somewhat elevated, although they seem to be elevated uniformly from the PA, RA and RV.  consider bubble study from left arm or cardiac MRI or CT  . H/O hiatal hernia     found during endoscopy in 2012  . Arthritis     hip, knees, feet, ankles  . Depression with anxiety 12/28/2010  . CAD (coronary artery disease)     Nonobstructive by cath 7/12:  50% proximal LAD  . Postmenopausal bleeding 08/23/2012  . Vaginitis and vulvovaginitis 10/21/2012  . Left knee pain 02/11/2013  . Pneumonia unknown    hx  . TMJ (dislocation of temporomandibular joint) 03/20/2013  . Thyromegaly 03/21/2013    History   Social History  . Marital Status: Married    Spouse Name: N/A    Number of Children: N/A  . Years of Education: N/A   Occupational History  . homemaker    Social History Main Topics  . Smoking status: Never Smoker   . Smokeless tobacco: Never Used  . Alcohol Use: 0.6 oz/week    1 Glasses of wine per week     Comment: occasionally   . Drug Use: No  . Sexual Activity: Yes    Birth Control/  Protection: Post-menopausal   Other Topics Concern  . Not on file   Social History Narrative   Lives in [place]   Has been married for 4 yerars.  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 Bone marrow transplant  01/95  . Cervical conization w/bx    . Arthoscopy      rt. knee  . Axillary node dissection Right 94  . Port-a-cath removed    . Hickman placed    . Biopsy breast      L & R  . Combined hysteroscopy diagnostic / d&c    . Hysteroscopy w/d&c N/A 08/23/2012    Procedure: DILATATION AND CURETTAGE /HYSTEROSCOPY;  Surgeon: [name], MD;  Location: [facility];  Service: Gynecology;  Laterality: N/A;  Removal of expelled essure coil  . Mastectomy, partial  1994    right  . Breast biopsy Left 02/20/2013    Procedure: LEFT BREAST CENTRAL DUCT EXCISION;  Surgeon: [name], MD;  Location: [facility];  Service: General;  Laterality: Left;    Family History  Problem Relation Age of Onset  .  COPD Mother   . Breast cancer Maternal Grandmother   . Tuberculosis Maternal Grandmother   . Stroke Maternal Grandmother   . Allergies Father   . Heart disease Father   . Stroke Father   . Heart attack Father   . Allergies Sister   . Deep vein thrombosis Sister     Allergies  Allergen Reactions  . Codeine   . Cymbalta [Duloxetine Hcl]     Personality changes  . Lamictal [Lamotrigine]   . Morphine And Related   . Sertraline Hcl     Current Outpatient Prescriptions on File Prior to Visit  Medication Sig Dispense Refill  . ALPRAZolam (XANAX) 0.5 MG tablet Take 1 tablet (0.5 mg total) by mouth 3 (three) times daily as needed for sleep.  90 tablet  1  . buPROPion (WELLBUTRIN XL) 300 MG 24 hr tablet Take 1 tablet (300 mg total) by mouth daily.  30 tablet  5  . furosemide (LASIX) 40 MG tablet Take 40 mg by mouth daily as needed for fluid or edema.      Marland Kitchen HYDROcodone-acetaminophen  (NORCO) 10-325 MG per tablet Take 1 tablet by mouth every 6 (six) hours as needed for pain.       . hyoscyamine (LEVSIN, ANASPAZ) 0.125 MG tablet Take 0.125 mg by mouth every 4 (four) hours as needed for cramping.      Marland Kitchen ibuprofen (ADVIL,MOTRIN) 200 MG tablet Take 600 mg by mouth every 6 (six) hours as needed for pain.      Marland Kitchen levothyroxine (SYNTHROID, LEVOTHROID) 75 MCG tablet take 1 tablet by mouth once daily  30 tablet  3  . ondansetron (ZOFRAN) 4 MG tablet Take 1 tablet (4 mg total) by mouth every 8 (eight) hours as needed for nausea or vomiting.  20 tablet  0  . traZODone (DESYREL) 50 MG tablet Take 0.5 tablets (25 mg total) by mouth at bedtime.  30 tablet  5   No current facility-administered medications on file prior to visit.    BP 100/80  Pulse 94  Temp(Src) 98.2 F (36.8 C) (Oral)  Wt 145 lb 1.3 oz (65.808 kg)  SpO2 99%       Objective:   Physical Exam  Constitutional: She is oriented to person, place, and time. She appears well-developed and well-nourished. No distress.  HENT:  Head: Normocephalic and atraumatic.  + frontal and maxillary sinus tenderness to palpation  Cardiovascular: Normal rate and regular rhythm.   No murmur heard. Pulmonary/Chest: Effort normal and breath sounds normal. No respiratory distress. She has no wheezes. She has no rales. She exhibits no tenderness.  Neurological: She is alert and oriented to person, place, and time.  Psychiatric: She has a normal mood and affect. Her behavior is normal. Judgment and thought content normal.          Assessment & Plan:

## 2013-09-11 NOTE — Assessment & Plan Note (Signed)
Will rx with amoxicillin. Add claritin and flonase for congestion/allergic rhinitis.Follow up if symptoms worsen or if symptoms are not improved in 2-3 days.

## 2013-09-13 ENCOUNTER — Telehealth: Payer: Self-pay | Admitting: Family Medicine

## 2013-09-13 NOTE — Telephone Encounter (Signed)
Patient states that she is not feeling any better since last visit. She says that she feels like something is "settling" in her chest. She says that she was given antibiotics last Tuesday and wanted to know if she should wait another day before coming back in?

## 2013-09-13 NOTE — Telephone Encounter (Signed)
Would have her wait til next week to come back for reeval unless she gets significantly worse. Remind her probiotics, zinc and plenty of fluids

## 2013-09-14 NOTE — Telephone Encounter (Signed)
Informed patient, she is worried it is settling in her lungs and is very worried.

## 2013-09-14 NOTE — Telephone Encounter (Signed)
fyi

## 2013-09-14 NOTE — Telephone Encounter (Signed)
If she can come in and do an xray today I would be willing to put in an order so we can be reassured that she is not going to get in trouble over the weekend. I will send her a my chart message over weekend if she is set up for that if it is late when she does it.

## 2013-09-14 NOTE — Telephone Encounter (Signed)
FYI:  Pt informed and states she will wait until Monday and just go get some zinc

## 2013-09-18 ENCOUNTER — Ambulatory Visit: Payer: Medicare Other | Admitting: Family

## 2013-09-26 ENCOUNTER — Ambulatory Visit (INDEPENDENT_AMBULATORY_CARE_PROVIDER_SITE_OTHER): Payer: Medicare Other | Admitting: Physician Assistant

## 2013-09-26 ENCOUNTER — Encounter: Payer: Self-pay | Admitting: Physician Assistant

## 2013-09-26 ENCOUNTER — Ambulatory Visit (HOSPITAL_BASED_OUTPATIENT_CLINIC_OR_DEPARTMENT_OTHER)
Admission: RE | Admit: 2013-09-26 | Discharge: 2013-09-26 | Disposition: A | Discharge status: Home / Self Care | Payer: Medicare Other | Source: Ambulatory Visit | Attending: Physician Assistant | Admitting: Physician Assistant

## 2013-09-26 VITALS — BP 98/72 | HR 101 | Temp 98.1°F | Resp 16 | Ht 65.0 in | Wt 149.5 lb

## 2013-09-26 DIAGNOSIS — R0789 Other chest pain: Secondary | ICD-10-CM | POA: Insufficient documentation

## 2013-09-26 DIAGNOSIS — R071 Chest pain on breathing: Secondary | ICD-10-CM

## 2013-09-26 DIAGNOSIS — J329 Chronic sinusitis, unspecified: Secondary | ICD-10-CM

## 2013-09-26 DIAGNOSIS — R0781 Pleurodynia: Secondary | ICD-10-CM

## 2013-09-26 MED ORDER — METHYLPREDNISOLONE ACETATE 40 MG/ML IJ SUSP
40.0000 mg | Freq: Once | INTRAMUSCULAR | Status: AC
  Administered 2013-09-26: 40 mg via INTRAMUSCULAR

## 2013-09-26 MED ORDER — DOXYCYCLINE HYCLATE 100 MG PO TABS: 100.0000 mg | ORAL_TABLET | Freq: Two times a day (BID) | ORAL | Status: DC

## 2013-09-26 MED ORDER — LEVOFLOXACIN 750 MG PO TABS: 750.0000 mg | ORAL_TABLET | Freq: Every day | ORAL | Status: DC

## 2013-09-26 MED ORDER — BENZONATATE 100 MG PO CAPS: 100.0000 mg | ORAL_CAPSULE | Freq: Two times a day (BID) | ORAL | Status: DC | PRN

## 2013-09-26 MED ORDER — DOXYCYCLINE HYCLATE 100 MG PO TABS: 100.0000 mg | ORAL_TABLET | Freq: Two times a day (BID) | ORAL | Status: AC

## 2013-09-26 NOTE — Progress Notes (Signed)
Patient presents to clinic today c/o continued sinus pressure, sinus pain, and cough.  Cough has worsened.  Patient now endorses chest congestion and pain with inspiration.  Denies wheezing.  Endorses mild shortness of breath secondary to cough.  Patient now with ~ 3 months of symptoms. Was seen a few weeks ago and given course of Amoxicillin without improvement.  Patient denies recent travel or sick contact.  Past Medical History  Diagnosis Date  . Allergic rhinitis   . Tremor   . Neuropathy   . Hypothyroidism   . History of migraine headaches     only one in the past  . Breast cancer   . Nonischemic cardiomyopathy     EF 50% 2014 at [facility]  . Congenital anomaly of superior vena cava     R Ht cath 7/12:  NL pressures, no pulmonary hypertension and normal wedge pressure; probable congenital anomaly with at least a left sided SVC going into the coronary sinus.  Right-sided sats were somewhat elevated, although they seem to be elevated uniformly from the PA, RA and RV.  consider bubble study from left arm or cardiac MRI or CT  . H/O hiatal hernia     found during endoscopy in 2012  . Arthritis     hip, knees, feet, ankles  . Depression with anxiety 12/28/2010  . CAD (coronary artery disease)     Nonobstructive by cath 7/12:  50% proximal LAD  . Postmenopausal bleeding 08/23/2012  . Vaginitis and vulvovaginitis 10/21/2012  . Left knee pain 02/11/2013  . Pneumonia unknown    hx  . TMJ (dislocation of temporomandibular joint) 03/20/2013  . Thyromegaly 03/21/2013    Current Outpatient Prescriptions on File Prior to Visit  Medication Sig Dispense Refill  . ALPRAZolam (XANAX) 0.5 MG tablet Take 1 tablet (0.5 mg total) by mouth 3 (three) times daily as needed for sleep.  90 tablet  1  . buPROPion (WELLBUTRIN XL) 300 MG 24 hr tablet Take 1 tablet (300 mg total) by mouth daily.  30 tablet  5  . fluticasone (FLONASE) 50 MCG/ACT nasal spray Place 2 sprays into both nostrils daily.      . furosemide  (LASIX) 40 MG tablet Take 40 mg by mouth daily as needed for fluid or edema.      Marland Kitchen HYDROcodone-acetaminophen (NORCO) 10-325 MG per tablet Take 1 tablet by mouth every 6 (six) hours as needed for pain.       . hyoscyamine (LEVSIN, ANASPAZ) 0.125 MG tablet Take 0.125 mg by mouth every 4 (four) hours as needed for cramping.      Marland Kitchen ibuprofen (ADVIL,MOTRIN) 200 MG tablet Take 600 mg by mouth every 6 (six) hours as needed for pain.      Marland Kitchen levothyroxine (SYNTHROID, LEVOTHROID) 75 MCG tablet take 1 tablet by mouth once daily  30 tablet  3  . ondansetron (ZOFRAN) 4 MG tablet Take 1 tablet (4 mg total) by mouth every 8 (eight) hours as needed for nausea or vomiting.  20 tablet  0  . traZODone (DESYREL) 50 MG tablet Take 0.5 tablets (25 mg total) by mouth at bedtime.  30 tablet  5   No current facility-administered medications on file prior to visit.    Allergies  Allergen Reactions  . Codeine   . Cymbalta [Duloxetine Hcl]     Personality changes  . Lamictal [Lamotrigine]   . Morphine And Related   . Sertraline Hcl     Family History  Problem Relation Age  of Onset  . COPD Mother   . Breast cancer Maternal Grandmother   . Tuberculosis Maternal Grandmother   . Stroke Maternal Grandmother   . Allergies Father   . Heart disease Father   . Stroke Father   . Heart attack Father   . Allergies Sister   . Deep vein thrombosis Sister     History   Social History  . Marital Status: Married    Spouse Name: N/A    Number of Children: N/A  . Years of Education: N/A   Occupational History  . homemaker    Social History Main Topics  . Smoking status: Never Smoker   . Smokeless tobacco: Never Used  . Alcohol Use: 0.6 oz/week    1 Glasses of wine per week     Comment: occasionally   . Drug Use: No  . Sexual Activity: Yes    Birth Control/ Protection: Post-menopausal   Other Topics Concern  . None   Social History Narrative   Lives in [place]   Has been married for 4 yerars.  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  All other ROS are negative.  BP 98/72  Pulse 101  Temp(Src) 98.1 F (36.7 C) (Oral)  Resp 16  Ht 5' 5" (1.651 m)  Wt 149 lb 8 oz (67.813 kg)  BMI 24.88 kg/m2  SpO2 99%  Physical Exam  Vitals reviewed. Constitutional: She is oriented to person, place, and time and well-developed, well-nourished, and in no distress.  HENT:  Head: Normocephalic and atraumatic.  Right Ear: External ear normal.  Left Ear: External ear normal.  Nose: Nose normal.  Mouth/Throat: Oropharynx is clear and moist. No oropharyngeal exudate.  TM within normal limits bilaterally.  + TTP of sinuses noted on exam.  Eyes: Conjunctivae are normal. Pupils are equal, round, and reactive to light.  Neck: Neck supple. No thyromegaly present.  Cardiovascular: Normal rate, regular rhythm, normal heart sounds and intact distal pulses.   Pulmonary/Chest: Effort normal and breath sounds normal. No respiratory distress. She has no wheezes. She has no rales. She exhibits no tenderness.  Lymphadenopathy:    She has no cervical adenopathy.  Neurological: She is alert and oriented to person, place, and time.  Skin: Skin is warm and dry. No rash noted.  Psychiatric: Affect normal.    Recent Results (from the past 2160 hour(s))  STOOL CULTURE     Status: None   Collection Time    07/16/13  2:46 PM      Result Value Ref Range   Organism ID, Bacteria No Salmonella,Shigella,Campylobacter,Yersinia,or     Organism ID, Bacteria No E.coli [id number]:H7 isolated.    OVA AND PARASITE SCREEN     Status: None   Collection Time    07/16/13  2:49 PM      Result Value Ref Range   OP No Ova or Parasites Seen     FECAL LACTOFERRIN     Status: None   Collection Time    07/16/13  4:09 PM      Result Value Ref Range   Lactoferrin NEGATIVE    CBC WITH DIFFERENTIAL     Status: None   Collection Time    07/16/13  4:48 PM      Result  Value Ref Range   WBC 6.5  4.0 - 10.5 K/uL   RBC 4.49  3.87 - 5.11 MIL/uL  Hemoglobin 14.1  12.0 - 15.0 g/dL   HCT 42.0  36.0 - 46.0 %   MCV 93.5  78.0 - 100.0 fL   MCH 31.4  26.0 - 34.0 pg   MCHC 33.6  30.0 - 36.0 g/dL   RDW 13.3  11.5 - 15.5 %   Platelets 252  150 - 400 K/uL   Neutrophils Relative % 55  43 - 77 %   Neutro Abs 3.6  1.7 - 7.7 K/uL   Lymphocytes Relative 32  12 - 46 %   Lymphs Abs 2.1  0.7 - 4.0 K/uL   Monocytes Relative 11  3 - 12 %   Monocytes Absolute 0.7  0.1 - 1.0 K/uL   Eosinophils Relative 1  0 - 5 %   Eosinophils Absolute 0.1  0.0 - 0.7 K/uL   Basophils Relative 1  0 - 1 %   Basophils Absolute 0.1  0.0 - 0.1 K/uL   Smear Review Criteria for review not met    COMPREHENSIVE METABOLIC PANEL     Status: None   Collection Time    07/16/13  4:48 PM      Result Value Ref Range   Sodium 139  135 - 145 mEq/L   Potassium 3.6  3.5 - 5.3 mEq/L   Chloride 102  96 - 112 mEq/L   CO2 28  19 - 32 mEq/L   Glucose, Bld 76  70 - 99 mg/dL   BUN 19  6 - 23 mg/dL   Creat 0.80  0.50 - 1.10 mg/dL   Total Bilirubin 0.6  0.3 - 1.2 mg/dL   Alkaline Phosphatase 89  39 - 117 U/L   AST 28  0 - 37 U/L   ALT 20  0 - 35 U/L   Total Protein 6.8  6.0 - 8.3 g/dL   Albumin 4.4  3.5 - 5.2 g/dL   Calcium 8.9  8.4 - 10.5 mg/dL    Assessment/Plan: Sinusitis Failed course of amoxicillin.  Depo IM given.  Rx Levaquin. Increase fluid intake.  Rest.  Saline nasal spray. Mucinex. Humidifier in bedroom.    Pleuritic chest pain Vitals and PE within normal limits.  Pain likely 2/2 persistent cough. Patient being treated for sinusitis.  Rx tessalon perles for cough.  Will obtain CXR given pain and patient's history of malignancy.

## 2013-09-26 NOTE — Addendum Note (Signed)
Addended by: Raiford Noble on: 09/26/2013 12:55 PM   Modules accepted: Orders

## 2013-09-26 NOTE — Addendum Note (Signed)
Addended by: Rockwell Germany on: 09/26/2013 01:04 PM   Modules accepted: Orders

## 2013-09-26 NOTE — Assessment & Plan Note (Signed)
Failed course of amoxicillin.  Depo IM given.  Rx Levaquin. Increase fluid intake.  Rest.  Saline nasal spray. Mucinex. Humidifier in bedroom.

## 2013-09-26 NOTE — Assessment & Plan Note (Signed)
Vitals and PE within normal limits.  Pain likely 2/2 persistent cough. Patient being treated for sinusitis.  Rx tessalon perles for cough.  Will obtain CXR given pain and patient's history of malignancy.

## 2013-09-26 NOTE — Patient Instructions (Signed)
Please take antibiotic as prescribed.  Increase fluid intake.  Use tessalon perles for cough.  Plain Mucinex.  Continue allergy medications.  Go downstairs for x-ray. I will call you with all of your results.

## 2013-10-08 ENCOUNTER — Other Ambulatory Visit: Payer: Self-pay | Admitting: Family Medicine

## 2013-10-15 ENCOUNTER — Encounter (HOSPITAL_BASED_OUTPATIENT_CLINIC_OR_DEPARTMENT_OTHER): Payer: Self-pay | Admitting: Emergency Medicine

## 2013-10-15 ENCOUNTER — Emergency Department (HOSPITAL_BASED_OUTPATIENT_CLINIC_OR_DEPARTMENT_OTHER): Payer: Medicare Other

## 2013-10-15 ENCOUNTER — Telehealth: Payer: Self-pay | Admitting: *Deleted

## 2013-10-15 ENCOUNTER — Ambulatory Visit (INDEPENDENT_AMBULATORY_CARE_PROVIDER_SITE_OTHER): Payer: Medicare Other | Admitting: Family

## 2013-10-15 ENCOUNTER — Emergency Department (HOSPITAL_BASED_OUTPATIENT_CLINIC_OR_DEPARTMENT_OTHER)
Admission: EM | Admit: 2013-10-15 | Discharge: 2013-10-15 | Disposition: A | Discharge status: Home / Self Care | Payer: Medicare Other | Attending: Emergency Medicine | Admitting: Emergency Medicine

## 2013-10-15 VITALS — BP 107/83 | HR 131 | Temp 98.2°F | Resp 18 | Wt 146.1 lb

## 2013-10-15 DIAGNOSIS — J40 Bronchitis, not specified as acute or chronic: Secondary | ICD-10-CM

## 2013-10-15 DIAGNOSIS — R000 Tachycardia, unspecified: Secondary | ICD-10-CM

## 2013-10-15 DIAGNOSIS — IMO0002 Reserved for concepts with insufficient information to code with codable children: Secondary | ICD-10-CM | POA: Insufficient documentation

## 2013-10-15 DIAGNOSIS — M129 Arthropathy, unspecified: Secondary | ICD-10-CM | POA: Insufficient documentation

## 2013-10-15 DIAGNOSIS — Z79899 Other long term (current) drug therapy: Secondary | ICD-10-CM | POA: Insufficient documentation

## 2013-10-15 DIAGNOSIS — R079 Chest pain, unspecified: Secondary | ICD-10-CM | POA: Insufficient documentation

## 2013-10-15 DIAGNOSIS — Z853 Personal history of malignant neoplasm of breast: Secondary | ICD-10-CM | POA: Insufficient documentation

## 2013-10-15 DIAGNOSIS — I2510 Atherosclerotic heart disease of native coronary artery without angina pectoris: Secondary | ICD-10-CM | POA: Insufficient documentation

## 2013-10-15 DIAGNOSIS — Z9889 Other specified postprocedural states: Secondary | ICD-10-CM | POA: Insufficient documentation

## 2013-10-15 DIAGNOSIS — Z87440 Personal history of urinary (tract) infections: Secondary | ICD-10-CM | POA: Insufficient documentation

## 2013-10-15 DIAGNOSIS — Z8719 Personal history of other diseases of the digestive system: Secondary | ICD-10-CM | POA: Insufficient documentation

## 2013-10-15 DIAGNOSIS — F341 Dysthymic disorder: Secondary | ICD-10-CM | POA: Insufficient documentation

## 2013-10-15 DIAGNOSIS — E039 Hypothyroidism, unspecified: Secondary | ICD-10-CM | POA: Insufficient documentation

## 2013-10-15 DIAGNOSIS — J209 Acute bronchitis, unspecified: Secondary | ICD-10-CM | POA: Insufficient documentation

## 2013-10-15 DIAGNOSIS — Z8701 Personal history of pneumonia (recurrent): Secondary | ICD-10-CM | POA: Insufficient documentation

## 2013-10-15 DIAGNOSIS — G43909 Migraine, unspecified, not intractable, without status migrainosus: Secondary | ICD-10-CM | POA: Insufficient documentation

## 2013-10-15 DIAGNOSIS — Z8742 Personal history of other diseases of the female genital tract: Secondary | ICD-10-CM | POA: Insufficient documentation

## 2013-10-15 DIAGNOSIS — Z791 Long term (current) use of non-steroidal anti-inflammatories (NSAID): Secondary | ICD-10-CM | POA: Insufficient documentation

## 2013-10-15 DIAGNOSIS — Z8774 Personal history of (corrected) congenital malformations of heart and circulatory system: Secondary | ICD-10-CM | POA: Insufficient documentation

## 2013-10-15 LAB — CBC WITH DIFFERENTIAL/PLATELET
Basophils Absolute: 0.1 10*3/uL (ref 0.0–0.1)
Basophils Relative: 1 % (ref 0–1)
Eosinophils Absolute: 0 10*3/uL (ref 0.0–0.7)
Eosinophils Relative: 0 % (ref 0–5)
HCT: 47.5 % — ABNORMAL HIGH (ref 36.0–46.0)
Hemoglobin: 16.2 g/dL — ABNORMAL HIGH (ref 12.0–15.0)
Lymphocytes Relative: 10 % — ABNORMAL LOW (ref 12–46)
Lymphs Abs: 0.7 10*3/uL (ref 0.7–4.0)
MCH: 33.1 pg (ref 26.0–34.0)
MCHC: 34.1 g/dL (ref 30.0–36.0)
MCV: 96.9 fL (ref 78.0–100.0)
Monocytes Absolute: 0.8 10*3/uL (ref 0.1–1.0)
Monocytes Relative: 10 % (ref 3–12)
Neutro Abs: 6.2 10*3/uL (ref 1.7–7.7)
Neutrophils Relative %: 80 % — ABNORMAL HIGH (ref 43–77)
Platelets: 217 10*3/uL (ref 150–400)
RBC: 4.9 MIL/uL (ref 3.87–5.11)
RDW: 13.1 % (ref 11.5–15.5)
WBC: 7.8 10*3/uL (ref 4.0–10.5)

## 2013-10-15 LAB — BASIC METABOLIC PANEL
BUN: 21 mg/dL (ref 6–23)
CO2: 24 mEq/L (ref 19–32)
Calcium: 9.5 mg/dL (ref 8.4–10.5)
Chloride: 99 mEq/L (ref 96–112)
Creatinine, Ser: 0.9 mg/dL (ref 0.50–1.10)
GFR calc Af Amer: 81 mL/min — ABNORMAL LOW (ref >90)
GFR calc non Af Amer: 70 mL/min — ABNORMAL LOW (ref >90)
Glucose, Bld: 113 mg/dL — ABNORMAL HIGH (ref 70–99)
Potassium: 4.1 mEq/L (ref 3.7–5.3)
Sodium: 137 mEq/L (ref 137–147)

## 2013-10-15 LAB — URINALYSIS, ROUTINE W REFLEX MICROSCOPIC
Bilirubin Urine: NEGATIVE
Glucose, UA: NEGATIVE mg/dL
Hgb urine dipstick: NEGATIVE
Ketones, ur: 15 mg/dL — AB
Leukocytes, UA: NEGATIVE
Nitrite: NEGATIVE
Protein, ur: NEGATIVE mg/dL
Specific Gravity, Urine: >1.046 — ABNORMAL HIGH (ref 1.005–1.030)
Urobilinogen, UA: 0.2 mg/dL (ref 0.0–1.0)
pH: 6 (ref 5.0–8.0)

## 2013-10-15 LAB — D-DIMER, QUANTITATIVE: D-Dimer, Quant: 0.55 ug/mL-FEU — ABNORMAL HIGH (ref 0.00–0.48)

## 2013-10-15 MED ORDER — IOHEXOL 350 MG/ML SOLN
100.0000 mL | Freq: Once | INTRAVENOUS | Status: AC | PRN
  Administered 2013-10-15: 100 mL via INTRAVENOUS

## 2013-10-15 MED ORDER — HYDROCOD POLST-CHLORPHEN POLST 10-8 MG/5ML PO LQCR: 5.0000 mL | Freq: Two times a day (BID) | ORAL | Status: DC | PRN

## 2013-10-15 MED ORDER — DOXYCYCLINE HYCLATE 100 MG PO TABS
100.0000 mg | ORAL_TABLET | Freq: Two times a day (BID) | ORAL | Status: DC
Start: 2013-10-15 — End: 2013-10-15

## 2013-10-15 MED ORDER — SODIUM CHLORIDE 0.9 % IV BOLUS (SEPSIS)
1000.0000 mL | Freq: Once | INTRAVENOUS | Status: AC
  Administered 2013-10-15: 1000 mL via INTRAVENOUS

## 2013-10-15 MED ORDER — LEVOFLOXACIN 750 MG PO TABS
750.0000 mg | ORAL_TABLET | Freq: Once | ORAL | Status: AC
  Administered 2013-10-15: 750 mg via ORAL
  Filled 2013-10-15: qty 1

## 2013-10-15 MED ORDER — LEVOFLOXACIN 750 MG PO TABS: 750.0000 mg | ORAL_TABLET | Freq: Every day | ORAL | Status: DC

## 2013-10-15 NOTE — Assessment & Plan Note (Signed)
Pt with sinus tach on EKG today. Ongoing chest pain.  Recent fever, rigors, clinically appears dehydrated.  Advised pt that she should proceed to the ED for further evaluation.  I am concerned about possibility of PE given ongoing chest pain, (family hx of DVT in sister), but also need to rule out other cause for fever/rigors such as PNA or UTI.  She appears dehydrated and would also benefit from IV fluids at this time. I have recommended that she be evaluated in the ED.

## 2013-10-15 NOTE — Telephone Encounter (Signed)
Reviewed note and ED records. Recommended doxycycline but pt declines. She is concerned re: potential drug interaction. Due to her concern recommended that she hold trazadone while taking levaquin. She is currently only taking 25mg  of trazadone which she uses for sleep.

## 2013-10-15 NOTE — Telephone Encounter (Signed)
Received call from pt/husband [name]) stating they were told that there could be a possible drug / drug interaction between levaquin and trazodone. Verified with pharmacist that it could cause prolong QT interval. Pt is concerned due to her "hx of heart problems and elevated heart rate in the office today".  He states they called the ER and was told that it was fine to proceed with abx but pt is concerned.  Please advise.

## 2013-10-15 NOTE — ED Notes (Signed)
Warm Blankets Provided.

## 2013-10-15 NOTE — Patient Instructions (Signed)
Please proceed directly to the ED on the first floor.  Hope you feel better!

## 2013-10-15 NOTE — Discharge Instructions (Signed)
Bronchitis Bronchitis is inflammation of the airways that extend from the windpipe into the lungs (bronchi). The inflammation often causes mucus to develop, which leads to a cough. If the inflammation becomes severe, it may cause shortness of breath. CAUSES  Bronchitis may be caused by:   Viral infections.   Bacteria.   Cigarette smoke.   Allergens, pollutants, and other irritants.  SIGNS AND SYMPTOMS  The most common symptom of bronchitis is a frequent cough that produces mucus. Other symptoms include:  Fever.   Body aches.   Chest congestion.   Chills.   Shortness of breath.   Sore throat.  DIAGNOSIS  Bronchitis is usually diagnosed through a medical history and physical exam. Tests, such as chest X-rays, are sometimes done to rule out other conditions.  TREATMENT  You may need to avoid contact with whatever caused the problem (smoking, for example). Medicines are sometimes needed. These may include:  Antibiotics. These may be prescribed if the condition is caused by bacteria.  Cough suppressants. These may be prescribed for relief of cough symptoms.   Inhaled medicines. These may be prescribed to help open your airways and make it easier for you to breathe.   Steroid medicines. These may be prescribed for those with recurrent (chronic) bronchitis. HOME CARE INSTRUCTIONS  Get plenty of rest.   Drink enough fluids to keep your urine clear or pale yellow (unless you have a medical condition that requires fluid restriction). Increasing fluids may help thin your secretions and will prevent dehydration.   Only take over-the-counter or prescription medicines as directed by your health care provider.  Only take antibiotics as directed. Make sure you finish them even if you start to feel better.  Avoid secondhand smoke, irritating chemicals, and strong fumes. These will make bronchitis worse. If you are a smoker, quit smoking. Consider using nicotine gum or  skin patches to help control withdrawal symptoms. Quitting smoking will help your lungs heal faster.   Put a cool-mist humidifier in your bedroom at night to moisten the air. This may help loosen mucus. Change the water in the humidifier daily. You can also run the hot water in your shower and sit in the bathroom with the door closed for 5 10 minutes.   Follow up with your health care provider as directed.   Wash your hands frequently to avoid catching bronchitis again or spreading an infection to others.  SEEK MEDICAL CARE IF: Your symptoms do not improve after 1 week of treatment.  SEEK IMMEDIATE MEDICAL CARE IF:  Your fever increases.  You have chills.   You have chest pain.   You have worsening shortness of breath.   You have bloody sputum.  You faint.  You have lightheadedness.  You have a severe headache.   You vomit repeatedly. MAKE SURE YOU:   Understand these instructions.  Will watch your condition.  Will get help right away if you are not doing well or get worse. Document Released: 06/14/2005 Document Revised: 04/04/2013 Document Reviewed: 02/06/2013 [facility] Patient Information 2014 [place].  Bronchitis Bronchitis is inflammation of the airways that extend from the windpipe into the lungs (bronchi). The inflammation often causes mucus to develop, which leads to a cough. If the inflammation becomes severe, it may cause shortness of breath. CAUSES  Bronchitis may be caused by:   Viral infections.   Bacteria.   Cigarette smoke.   Allergens, pollutants, and other irritants.  SIGNS AND SYMPTOMS  The most common symptom of bronchitis is  a frequent cough that produces mucus. Other symptoms include:  Fever.   Body aches.   Chest congestion.   Chills.   Shortness of breath.   Sore throat.  DIAGNOSIS  Bronchitis is usually diagnosed through a medical history and physical exam. Tests, such as chest X-rays, are sometimes  done to rule out other conditions.  TREATMENT  You may need to avoid contact with whatever caused the problem (smoking, for example). Medicines are sometimes needed. These may include:  Antibiotics. These may be prescribed if the condition is caused by bacteria.  Cough suppressants. These may be prescribed for relief of cough symptoms.   Inhaled medicines. These may be prescribed to help open your airways and make it easier for you to breathe.   Steroid medicines. These may be prescribed for those with recurrent (chronic) bronchitis. HOME CARE INSTRUCTIONS  Get plenty of rest.   Drink enough fluids to keep your urine clear or pale yellow (unless you have a medical condition that requires fluid restriction). Increasing fluids may help thin your secretions and will prevent dehydration.   Only take over-the-counter or prescription medicines as directed by your health care provider.  Only take antibiotics as directed. Make sure you finish them even if you start to feel better.  Avoid secondhand smoke, irritating chemicals, and strong fumes. These will make bronchitis worse. If you are a smoker, quit smoking. Consider using nicotine gum or skin patches to help control withdrawal symptoms. Quitting smoking will help your lungs heal faster.   Put a cool-mist humidifier in your bedroom at night to moisten the air. This may help loosen mucus. Change the water in the humidifier daily. You can also run the hot water in your shower and sit in the bathroom with the door closed for 5 10 minutes.   Follow up with your health care provider as directed.   Wash your hands frequently to avoid catching bronchitis again or spreading an infection to others.  SEEK MEDICAL CARE IF: Your symptoms do not improve after 1 week of treatment.  SEEK IMMEDIATE MEDICAL CARE IF:  Your fever increases.  You have chills.   You have chest pain.   You have worsening shortness of breath.   You have  bloody sputum.  You faint.  You have lightheadedness.  You have a severe headache.   You vomit repeatedly. MAKE SURE YOU:   Understand these instructions.  Will watch your condition.  Will get help right away if you are not doing well or get worse. Document Released: 06/14/2005 Document Revised: 04/04/2013 Document Reviewed: 02/06/2013 [facility] Patient Information 2014 [place].

## 2013-10-15 NOTE — ED Notes (Signed)
Pt has been being treated for possible pneumonia since mid-March but has not gotten any better. Pt began having mid sternal CP last night and pain in shoulder blades. She describes it as a stabbing pain with some SOB and nausea.

## 2013-10-15 NOTE — ED Provider Notes (Signed)
CSN: [id number]     Arrival date & time 10/15/13  1121 History   First MD Initiated Contact with Patient 10/15/13 1132     Chief Complaint  Patient presents with  . Chest Pain     (Consider location/radiation/quality/duration/timing/severity/associated sxs/prior Treatment) Patient is a 58 y.o. female presenting with chest pain.  Chest Pain  Pt with multiple medical problems has had about 5 weeks of cough and SOB, given 2 separate Abx courses (augmentin and then doxycycline) with some improvement each time, but worsening over the last 2-3 days. Has had fever to 102F at home last night, continues to cough up phlegm, complaining of burning midsternal chest pain, worse with cough, radiating into her R shoulder and intrascapular area. Seen at PCP office this morning and sent to the ED for further evaluation, including concern for PE.   Past Medical History  Diagnosis Date  . Allergic rhinitis   . Tremor   . Neuropathy   . Hypothyroidism   . History of migraine headaches     only one in the past  . Breast cancer   . Nonischemic cardiomyopathy     EF 50% 2014 at [facility]  . Congenital anomaly of superior vena cava     R Ht cath 7/12:  NL pressures, no pulmonary hypertension and normal wedge pressure; probable congenital anomaly with at least a left sided SVC going into the coronary sinus.  Right-sided sats were somewhat elevated, although they seem to be elevated uniformly from the PA, RA and RV.  consider bubble study from left arm or cardiac MRI or CT  . H/O hiatal hernia     found during endoscopy in 2012  . Arthritis     hip, knees, feet, ankles  . Depression with anxiety 12/28/2010  . CAD (coronary artery disease)     Nonobstructive by cath 7/12:  50% proximal LAD  . Postmenopausal bleeding 08/23/2012  . Vaginitis and vulvovaginitis 10/21/2012  . Left knee pain 02/11/2013  . Pneumonia unknown    hx  . TMJ (dislocation of temporomandibular joint) 03/20/2013  . Thyromegaly 03/21/2013    Past Surgical History  Procedure Laterality Date  . Bone marrow transplant  01/95  . Cervical conization w/bx    . Arthoscopy      rt. knee  . Axillary node dissection Right 94  . Port-a-cath removed    . Hickman placed    . Biopsy breast      L & R  . Combined hysteroscopy diagnostic / d&c    . Hysteroscopy w/d&c N/A 08/23/2012    Procedure: DILATATION AND CURETTAGE /HYSTEROSCOPY;  Surgeon: [name], MD;  Location: [facility];  Service: Gynecology;  Laterality: N/A;  Removal of expelled essure coil  . Mastectomy, partial  1994    right  . Breast biopsy Left 02/20/2013    Procedure: LEFT BREAST CENTRAL DUCT EXCISION;  Surgeon: [name], MD;  Location: [facility];  Service: General;  Laterality: Left;   Family History  Problem Relation Age of Onset  . COPD Mother   . Breast cancer Maternal Grandmother   . Tuberculosis Maternal Grandmother   . Stroke Maternal Grandmother   . Allergies Father   . Heart disease Father   . Stroke Father   . Heart attack Father   . Allergies Sister   . Deep vein thrombosis Sister    History  Substance Use Topics  . Smoking status: Never Smoker   . Smokeless tobacco: Never Used  .  Alcohol Use: 0.6 oz/week    1 Glasses of wine per week     Comment: occasionally    OB History   Grav Para Term Preterm Abortions TAB SAB Ect Mult Living                 Review of Systems  Cardiovascular: Positive for chest pain.   All other systems reviewed and are negative except as noted in HPI.     Allergies  Codeine; Cymbalta; Lamictal; Morphine and related; and Sertraline hcl  Home Medications   Prior to Admission medications   Medication Sig Start Date End Date Taking? Authorizing Provider  ALPRAZolam Duanne Moron) 0.5 MG tablet Take 1 tablet (0.5 mg total) by mouth 3 (three) times daily as needed for sleep. 03/20/13   Mosie Lukes, MD  benzonatate (TESSALON) 100 MG capsule Take 1 capsule (100 mg total) by mouth 2 (two) times daily as needed  for cough. 09/26/13   Leeanne Rio, PA-C  buPROPion (WELLBUTRIN XL) 300 MG 24 hr tablet Take 1 tablet (300 mg total) by mouth daily. 07/27/13   Mosie Lukes, MD  fluticasone (FLONASE) 50 MCG/ACT nasal spray Place 2 sprays into both nostrils daily.    Historical Provider, MD  furosemide (LASIX) 40 MG tablet Take 40 mg by mouth daily as needed for fluid or edema.    Historical Provider, MD  HYDROcodone-acetaminophen (NORCO) 10-325 MG per tablet Take 1 tablet by mouth every 6 (six) hours as needed for pain.     Historical Provider, MD  hyoscyamine (LEVSIN, ANASPAZ) 0.125 MG tablet Take 0.125 mg by mouth every 4 (four) hours as needed for cramping.    Historical Provider, MD  ibuprofen (ADVIL,MOTRIN) 200 MG tablet Take 600 mg by mouth every 6 (six) hours as needed for pain.    Historical Provider, MD  levothyroxine (SYNTHROID, LEVOTHROID) 75 MCG tablet take 1 tablet by mouth once daily 10/08/13   Mosie Lukes, MD  ondansetron (ZOFRAN) 4 MG tablet Take 1 tablet (4 mg total) by mouth every 8 (eight) hours as needed for nausea or vomiting. 07/16/13   Leeanne Rio, PA-C  traZODone (DESYREL) 50 MG tablet Take 0.5 tablets (25 mg total) by mouth at bedtime. 09/03/13   Mosie Lukes, MD   BP 132/66  Pulse 94  Temp(Src) 99.5 F (37.5 C) (Oral)  Resp 14  Ht 5' 5.5" (1.664 m)  Wt 146 lb (66.225 kg)  BMI 23.92 kg/m2  SpO2 99% Physical Exam  Nursing note and vitals reviewed. Constitutional: She is oriented to person, place, and time. She appears well-developed and well-nourished.  HENT:  Head: Normocephalic and atraumatic.  Eyes: EOM are normal. Pupils are equal, round, and reactive to light.  Neck: Normal range of motion. Neck supple.  Cardiovascular: Normal heart sounds and intact distal pulses.  Tachycardia present.   Pulmonary/Chest: Effort normal and breath sounds normal. She has no wheezes. She has no rales.  Abdominal: Bowel sounds are normal. She exhibits no distension. There is no  tenderness. There is no rebound.  Musculoskeletal: Normal range of motion. She exhibits no edema and no tenderness.  Neurological: She is alert and oriented to person, place, and time. She has normal strength. No cranial nerve deficit or sensory deficit.  Skin: Skin is warm and dry. No rash noted.  Psychiatric: She has a normal mood and affect.    ED Course  Procedures (including critical care time) Labs Review Labs Reviewed  CBC WITH DIFFERENTIAL -  Abnormal; Notable for the following:    Hemoglobin 16.2 (*)    HCT 47.5 (*)    Neutrophils Relative % 80 (*)    Lymphocytes Relative 10 (*)    All other components within normal limits  BASIC METABOLIC PANEL - Abnormal; Notable for the following:    Glucose, Bld 113 (*)    GFR calc non Af Amer 70 (*)    GFR calc Af Amer 81 (*)    All other components within normal limits  D-DIMER, QUANTITATIVE - Abnormal; Notable for the following:    D-Dimer, Quant 0.55 (*)    All other components within normal limits  URINALYSIS, ROUTINE W REFLEX MICROSCOPIC    Imaging Review Dg Chest 2 View  10/15/2013   CLINICAL DATA:  Cough and shortness of breath  EXAM: CHEST  2 VIEW  COMPARISON:  09/26/2013  FINDINGS: Cardiac shadow is stable. The lungs are well aerated bilaterally. No focal infiltrate or sizable effusion is seen. Pectus excavatum is again identified. Postsurgical changes are again seen in the right axilla.  IMPRESSION: No acute abnormality noted.   Electronically Signed   By: Inez Catalina M.D.   On: 10/15/2013 12:27    EKG not in MUSE  Date: 10/15/2013  Rate: 104  Rhythm: sinus tachycardia  QRS Axis: normal  Intervals: normal  ST/T Wave abnormalities: normal  Conduction Disutrbances:none  Narrative Interpretation:   Old EKG Reviewed: unchanged     MDM   Final diagnoses:  Bronchitis    Pt is low risk for PE, symptoms much more likely to be infectious in etiology, but d-dimer is slightly elevated. Send for angiogram.   CXR and  CT neg for infiltrate, probably a bronchitis but given return of fever will treat with Levaquin. Advised to continue to rest, drink plenty of fluids, tussionex for cough and PCP followup if not improving.     Sarenity Ramaker B. Karle Starch, MD 10/15/13 1428

## 2013-10-15 NOTE — ED Notes (Signed)
MD at bedside. 

## 2013-10-15 NOTE — Progress Notes (Signed)
Subjective:    Patient ID: [name], female    DOB: [birth year], 58 y.o.   MRN: [id number]  HPI  [name] is a 58 yr old female who presents today with chief complaint of cough. Cough has been present x 5 weeks. She was seen by [name] on 09/26/13 and underwent a cxr at that time which was negative. She was treated with doxycycline. She was treated on 3/17 with amoxicillin for sinusitis prior to doxycycline. Reports that initially she seemed to be feeling better on the doxycycline but reports she recently began to feel worse.      Reports Tmax 101.5 last night. Taking claritin,  Mucinex, zinc lozenges, advil without significant improvement in her symptoms.  Initially felt like she was getting better but got tired and weak. Reports head congestion, cough, pain in the upper neck, chills/sweats.  Some left sided chest pain today.  Reports poor PO intake, not drinking enough.  + sore throat.    Review of Systems See HPI  Past Medical History  Diagnosis Date  . Allergic rhinitis   . Tremor   . Neuropathy   . Hypothyroidism   . History of migraine headaches     only one in the past  . Breast cancer   . Nonischemic cardiomyopathy     EF 50% 2014 at [facility]  . Congenital anomaly of superior vena cava     R Ht cath 7/12:  NL pressures, no pulmonary hypertension and normal wedge pressure; probable congenital anomaly with at least a left sided SVC going into the coronary sinus.  Right-sided sats were somewhat elevated, although they seem to be elevated uniformly from the PA, RA and RV.  consider bubble study from left arm or cardiac MRI or CT  . H/O hiatal hernia     found during endoscopy in 2012  . Arthritis     hip, knees, feet, ankles  . Depression with anxiety 12/28/2010  . CAD (coronary artery disease)     Nonobstructive by cath 7/12:  50% proximal LAD  . Postmenopausal bleeding 08/23/2012  . Vaginitis and vulvovaginitis 10/21/2012  . Left knee pain 02/11/2013  . Pneumonia  unknown    hx  . TMJ (dislocation of temporomandibular joint) 03/20/2013  . Thyromegaly 03/21/2013    History   Social History  . Marital Status: Married    Spouse Name: N/A    Number of Children: N/A  . Years of Education: N/A   Occupational History  . homemaker    Social History Main Topics  . Smoking status: Never Smoker   . Smokeless tobacco: Never Used  . Alcohol Use: 0.6 oz/week    1 Glasses of wine per week     Comment: occasionally   . Drug Use: No  . Sexual Activity: Yes    Birth Control/ Protection: Post-menopausal   Other Topics Concern  . Not on file   Social History Narrative   Lives in [place]   Has been married for 4 yerars.  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 Bone marrow transplant  01/95  . Cervical conization w/bx    . Arthoscopy      rt. knee  . Axillary node dissection Right 94  . Port-a-cath removed    . Hickman placed    . Biopsy breast      L & R  .  Combined hysteroscopy diagnostic / d&c    . Hysteroscopy w/d&c N/A 08/23/2012    Procedure: DILATATION AND CURETTAGE /HYSTEROSCOPY;  Surgeon: [name], MD;  Location: [facility];  Service: Gynecology;  Laterality: N/A;  Removal of expelled essure coil  . Mastectomy, partial  1994    right  . Breast biopsy Left 02/20/2013    Procedure: LEFT BREAST CENTRAL DUCT EXCISION;  Surgeon: [name], MD;  Location: [facility];  Service: General;  Laterality: Left;    Family History  Problem Relation Age of Onset  . COPD Mother   . Breast cancer Maternal Grandmother   . Tuberculosis Maternal Grandmother   . Stroke Maternal Grandmother   . Allergies Father   . Heart disease Father   . Stroke Father   . Heart attack Father   . Allergies Sister   . Deep vein thrombosis Sister     Allergies  Allergen Reactions  . Codeine   . Cymbalta [Duloxetine Hcl]     Personality changes  . Lamictal  [Lamotrigine]   . Morphine And Related   . Sertraline Hcl     Current Outpatient Prescriptions on File Prior to Visit  Medication Sig Dispense Refill  . ALPRAZolam (XANAX) 0.5 MG tablet Take 1 tablet (0.5 mg total) by mouth 3 (three) times daily as needed for sleep.  90 tablet  1  . benzonatate (TESSALON) 100 MG capsule Take 1 capsule (100 mg total) by mouth 2 (two) times daily as needed for cough.  20 capsule  0  . buPROPion (WELLBUTRIN XL) 300 MG 24 hr tablet Take 1 tablet (300 mg total) by mouth daily.  30 tablet  5  . fluticasone (FLONASE) 50 MCG/ACT nasal spray Place 2 sprays into both nostrils daily.      . furosemide (LASIX) 40 MG tablet Take 40 mg by mouth daily as needed for fluid or edema.      Marland Kitchen HYDROcodone-acetaminophen (NORCO) 10-325 MG per tablet Take 1 tablet by mouth every 6 (six) hours as needed for pain.       . hyoscyamine (LEVSIN, ANASPAZ) 0.125 MG tablet Take 0.125 mg by mouth every 4 (four) hours as needed for cramping.      Marland Kitchen ibuprofen (ADVIL,MOTRIN) 200 MG tablet Take 600 mg by mouth every 6 (six) hours as needed for pain.      Marland Kitchen levothyroxine (SYNTHROID, LEVOTHROID) 75 MCG tablet take 1 tablet by mouth once daily  30 tablet  3  . ondansetron (ZOFRAN) 4 MG tablet Take 1 tablet (4 mg total) by mouth every 8 (eight) hours as needed for nausea or vomiting.  20 tablet  0  . traZODone (DESYREL) 50 MG tablet Take 0.5 tablets (25 mg total) by mouth at bedtime.  30 tablet  5   No current facility-administered medications on file prior to visit.    BP 107/83  Pulse 131  Temp(Src) 98.2 F (36.8 C) (Oral)  Resp 18  Wt 146 lb 1.3 oz (66.261 kg)  SpO2 98%       Objective:   Physical Exam  Constitutional: She is oriented to person, place, and time.  Ill appearing white female laying flat on bed.  + rigors  HENT:  Right Ear: Tympanic membrane and ear canal normal.  Left Ear: Tympanic membrane and ear canal normal.  Mouth/Throat: No oropharyngeal exudate, posterior  oropharyngeal edema or posterior oropharyngeal erythema.  Cardiovascular: S1 normal and S2 normal.  Tachycardia present.   Pulmonary/Chest: Effort normal and breath  sounds normal. No respiratory distress. She has no wheezes. She has no rales. She exhibits no tenderness.  Musculoskeletal: She exhibits no edema.  Neurological: She is alert and oriented to person, place, and time.  Skin: Skin is warm and dry.  Psychiatric: She has a normal mood and affect. Her behavior is normal. Judgment and thought content normal.          Assessment & Plan:  Report given to [facility], charge nurse in the [facility] ED.  Pt will be brought down by wheelchair.

## 2013-10-15 NOTE — Progress Notes (Signed)
Pre visit review using our clinic review tool, if applicable. No additional management support is needed unless otherwise documented below in the visit note. 

## 2013-10-18 ENCOUNTER — Telehealth: Payer: Self-pay

## 2013-10-18 NOTE — Telephone Encounter (Signed)
So the Levaquin they gave her should be helpful for sinus infection. Finish that and if it gets in her sinus we have to use it longer, can call her in Levaquin 500 mg po daily x 8 days to start when she finishes the 750

## 2013-10-18 NOTE — Telephone Encounter (Signed)
Patient left a message stating that she saw [facility] Monday morning and was sent to the hospital.  Pt states her Ears are "popping" and ringing, still running a fever, feels like she has developed a sinus infection.  Please advise?

## 2013-10-19 ENCOUNTER — Encounter: Payer: Self-pay | Admitting: Family

## 2013-10-19 ENCOUNTER — Ambulatory Visit (INDEPENDENT_AMBULATORY_CARE_PROVIDER_SITE_OTHER): Payer: Medicare Other | Admitting: Family

## 2013-10-19 VITALS — BP 128/80 | HR 93 | Temp 98.0°F | Resp 18 | Ht 65.0 in | Wt 146.0 lb

## 2013-10-19 DIAGNOSIS — J209 Acute bronchitis, unspecified: Secondary | ICD-10-CM

## 2013-10-19 DIAGNOSIS — J329 Chronic sinusitis, unspecified: Secondary | ICD-10-CM

## 2013-10-19 MED ORDER — ALBUTEROL SULFATE (2.5 MG/3ML) 0.083% IN NEBU
2.5000 mg | INHALATION_SOLUTION | Freq: Once | RESPIRATORY_TRACT | Status: AC
  Administered 2013-10-19: 2.5 mg via RESPIRATORY_TRACT

## 2013-10-19 MED ORDER — PREDNISONE 10 MG PO TABS: ORAL_TABLET | ORAL | Status: DC

## 2013-10-19 MED ORDER — ALBUTEROL SULFATE HFA 108 (90 BASE) MCG/ACT IN AERS: 2.0000 | INHALATION_SPRAY | Freq: Four times a day (QID) | RESPIRATORY_TRACT | Status: DC | PRN

## 2013-10-19 NOTE — Addendum Note (Signed)
Addended by: Kelle Darting A on: 10/19/2013 05:11 PM   Modules accepted: Orders

## 2013-10-19 NOTE — Assessment & Plan Note (Signed)
Should be adequately covered by 7 day course of levaquin 750mg .

## 2013-10-19 NOTE — Progress Notes (Signed)
Subjective:    Patient ID: [name], female    DOB: 1955/09/22, 58 y.o.   MRN: [id number]  HPI  [name] is a 58 yr old female who presents today for follow up of her cough. I saw her on 10/15/13 and she was noted to be tachycardic with poor po intake. Cough has now been present x 6 weeks. She was sent to the ED.  Records are reviewed:  CTA chest was negative for PE or pneumonia. Lab work was unremarkable except for + d. Dimer which was evaluated by CTA chest. She was treated with empiric levaquin and tussionex.   Reports that she is coughing up yellow/green/grey and sometimes bloody mucous. Reports that she can hear herself wheezing.  She believes she has developed a sinus infection- blowing a lot of green. Reports some swollen glands in her neck.    Review of Systems See HPI  Past Medical History  Diagnosis Date  . Allergic rhinitis   . Tremor   . Neuropathy   . Hypothyroidism   . History of migraine headaches     only one in the past  . Breast cancer   . Nonischemic cardiomyopathy     EF 50% 2014 at [facility]  . Congenital anomaly of superior vena cava     R Ht cath 7/12:  NL pressures, no pulmonary hypertension and normal wedge pressure; probable congenital anomaly with at least a left sided SVC going into the coronary sinus.  Right-sided sats were somewhat elevated, although they seem to be elevated uniformly from the PA, RA and RV.  consider bubble study from left arm or cardiac MRI or CT  . H/O hiatal hernia     found during endoscopy in 2012  . Arthritis     hip, knees, feet, ankles  . Depression with anxiety 12/28/2010  . CAD (coronary artery disease)     Nonobstructive by cath 7/12:  50% proximal LAD  . Postmenopausal bleeding 08/23/2012  . Vaginitis and vulvovaginitis 10/21/2012  . Left knee pain 02/11/2013  . Pneumonia unknown    hx  . TMJ (dislocation of temporomandibular joint) 03/20/2013  . Thyromegaly 03/21/2013    History   Social History  . Marital Status:  Married    Spouse Name: N/A    Number of Children: N/A  . Years of Education: N/A   Occupational History  . homemaker    Social History Main Topics  . Smoking status: Never Smoker   . Smokeless tobacco: Never Used  . Alcohol Use: 0.6 oz/week    1 Glasses of wine per week     Comment: occasionally   . Drug Use: No  . Sexual Activity: Yes    Birth Control/ Protection: Post-menopausal   Other Topics Concern  . Not on file   Social History Narrative   Lives in [place]   Has been married for 4 yerars.  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 Bone marrow transplant  01/95  . Cervical conization w/bx    . Arthoscopy      rt. knee  . Axillary node dissection Right 94  . Port-a-cath removed    . Hickman placed    . Biopsy breast      L & R  . Combined hysteroscopy diagnostic / d&c    . Hysteroscopy w/d&c N/A 08/23/2012    Procedure: DILATATION  AND CURETTAGE /HYSTEROSCOPY;  Surgeon: [name], MD;  Location: [facility];  Service: Gynecology;  Laterality: N/A;  Removal of expelled essure coil  . Mastectomy, partial  1994    right  . Breast biopsy Left 02/20/2013    Procedure: LEFT BREAST CENTRAL DUCT EXCISION;  Surgeon: [name], MD;  Location: [facility];  Service: General;  Laterality: Left;    Family History  Problem Relation Age of Onset  . COPD Mother   . Breast cancer Maternal Grandmother   . Tuberculosis Maternal Grandmother   . Stroke Maternal Grandmother   . Allergies Father   . Heart disease Father   . Stroke Father   . Heart attack Father   . Allergies Sister   . Deep vein thrombosis Sister     Allergies  Allergen Reactions  . Codeine   . Cymbalta [Duloxetine Hcl]     Personality changes  . Lamictal [Lamotrigine]   . Morphine And Related   . Sertraline Hcl     Current Outpatient Prescriptions on File Prior to Visit  Medication Sig Dispense  Refill  . ALPRAZolam (XANAX) 0.5 MG tablet Take 1 tablet (0.5 mg total) by mouth 3 (three) times daily as needed for sleep.  90 tablet  1  . benzonatate (TESSALON) 100 MG capsule Take 1 capsule (100 mg total) by mouth 2 (two) times daily as needed for cough.  20 capsule  0  . buPROPion (WELLBUTRIN XL) 300 MG 24 hr tablet Take 1 tablet (300 mg total) by mouth daily.  30 tablet  5  . chlorpheniramine-HYDROcodone (TUSSIONEX PENNKINETIC ER) 10-8 MG/5ML LQCR Take 5 mLs by mouth every 12 (twelve) hours as needed for cough.  115 mL  0  . fluticasone (FLONASE) 50 MCG/ACT nasal spray Place 2 sprays into both nostrils daily.      . furosemide (LASIX) 40 MG tablet Take 40 mg by mouth daily as needed for fluid or edema.      Marland Kitchen HYDROcodone-acetaminophen (NORCO) 10-325 MG per tablet Take 1 tablet by mouth every 6 (six) hours as needed for pain.       . hyoscyamine (LEVSIN, ANASPAZ) 0.125 MG tablet Take 0.125 mg by mouth every 4 (four) hours as needed for cramping.      Marland Kitchen ibuprofen (ADVIL,MOTRIN) 200 MG tablet Take 600 mg by mouth every 6 (six) hours as needed for pain.      Marland Kitchen levofloxacin (LEVAQUIN) 750 MG tablet Take 1 tablet (750 mg total) by mouth daily.  6 tablet  0  . levothyroxine (SYNTHROID, LEVOTHROID) 75 MCG tablet take 1 tablet by mouth once daily  30 tablet  3  . ondansetron (ZOFRAN) 4 MG tablet Take 1 tablet (4 mg total) by mouth every 8 (eight) hours as needed for nausea or vomiting.  20 tablet  0  . traZODone (DESYREL) 50 MG tablet Take 0.5 tablets (25 mg total) by mouth at bedtime.  30 tablet  5   No current facility-administered medications on file prior to visit.    BP 128/80  Pulse 93  Temp(Src) 98 F (36.7 C) (Oral)  Resp 18  Ht 5\' 5"  (1.651 m)  Wt 146 lb (66.225 kg)  BMI 24.30 kg/m2  SpO2 99%       Objective:   Physical Exam  Constitutional: She is oriented to person, place, and time. She appears well-developed and well-nourished. No distress.  HENT:  Head: Normocephalic and  atraumatic.  Right Ear: Tympanic membrane and ear canal normal.  Left Ear: Tympanic membrane and ear canal normal.  Mouth/Throat: No oropharyngeal exudate, posterior oropharyngeal edema or posterior oropharyngeal erythema.  + frontal and maxillary sinus tenderness to palpation  Cardiovascular: Normal rate and regular rhythm.   No murmur heard. Pulmonary/Chest: Effort normal. She has wheezes. She has no rales.  Neurological: She is alert and oriented to person, place, and time.  Skin: Skin is warm and dry.  Psychiatric: She has a normal mood and affect. Her behavior is normal. Judgment and thought content normal.          Assessment & Plan:

## 2013-10-19 NOTE — Assessment & Plan Note (Signed)
Complete abx, start albuterol MDI and pred taper. Follow up in 1 week. Call if persistent fever.  I suspect hemoptysis is due to severe coughing, CTA chest was reassuring.

## 2013-10-19 NOTE — Patient Instructions (Signed)
Continue mucinex. Start prednisone taper today. Use albuterol 2 puffs every 6 hours for next few days. Complete levaquin, continue probiotic. Call if recurrent fever after completion of levaquin.  Follow up in 1 week.  Hope you feel better!

## 2013-10-19 NOTE — Progress Notes (Signed)
Pre visit review using our clinic review tool, if applicable. No additional management support is needed unless otherwise documented below in the visit note. 

## 2013-10-22 NOTE — Telephone Encounter (Signed)
Check other notes

## 2013-10-25 ENCOUNTER — Ambulatory Visit (INDEPENDENT_AMBULATORY_CARE_PROVIDER_SITE_OTHER): Payer: Medicare Other | Admitting: Family Medicine

## 2013-10-25 ENCOUNTER — Encounter: Payer: Self-pay | Admitting: Family Medicine

## 2013-10-25 VITALS — BP 118/74 | HR 105 | Temp 98.8°F | Ht 65.0 in | Wt 145.0 lb

## 2013-10-25 DIAGNOSIS — K5900 Constipation, unspecified: Secondary | ICD-10-CM

## 2013-10-25 DIAGNOSIS — E039 Hypothyroidism, unspecified: Secondary | ICD-10-CM

## 2013-10-25 DIAGNOSIS — F411 Generalized anxiety disorder: Secondary | ICD-10-CM

## 2013-10-25 NOTE — Progress Notes (Signed)
Pre visit review using our clinic review tool, if applicable. No additional management support is needed unless otherwise documented below in the visit note. 

## 2013-10-25 NOTE — Progress Notes (Signed)
Patient ID: [name], female   DOB: Mar 14, 1956, 58 y.o.   MRN: [id number] [name] [id number] 1955-12-14 10/25/2013      Progress Note-Follow Up  Subjective  Chief Complaint  Chief Complaint  Patient presents with  . Follow-up    1 week    HPI  Patient is a 58 year old female in today for routine medical care. He has just helped her mother and father in law through hospice and feels she's  been physically ill frequently since then, her cough and shortness of breath is improving. Her sinus infection is improved status post her steroid and antibiotic treatment. Denies CP/palp/SOB/HA/congestion/fevers/GI or GU c/o. Taking meds as prescribed  Past Medical History  Diagnosis Date  . Allergic rhinitis   . Tremor   . Neuropathy   . Hypothyroidism   . History of migraine headaches     only one in the past  . Breast cancer   . Nonischemic cardiomyopathy     EF 50% 2014 at [facility]  . Congenital anomaly of superior vena cava     R Ht cath 7/12:  NL pressures, no pulmonary hypertension and normal wedge pressure; probable congenital anomaly with at least a left sided SVC going into the coronary sinus.  Right-sided sats were somewhat elevated, although they seem to be elevated uniformly from the PA, RA and RV.  consider bubble study from left arm or cardiac MRI or CT  . H/O hiatal hernia     found during endoscopy in 2012  . Arthritis     hip, knees, feet, ankles  . Depression with anxiety 12/28/2010  . CAD (coronary artery disease)     Nonobstructive by cath 7/12:  50% proximal LAD  . Postmenopausal bleeding 08/23/2012  . Vaginitis and vulvovaginitis 10/21/2012  . Left knee pain 02/11/2013  . Pneumonia unknown    hx  . TMJ (dislocation of temporomandibular joint) 03/20/2013  . Thyromegaly 03/21/2013    Past Surgical History  Procedure Laterality Date  . Bone marrow transplant  01/95  . Cervical conization w/bx    . Arthoscopy      rt. knee  . Axillary node dissection Right 94   . Port-a-cath removed    . Hickman placed    . Biopsy breast      L & R  . Combined hysteroscopy diagnostic / d&c    . Hysteroscopy w/d&c N/A 08/23/2012    Procedure: DILATATION AND CURETTAGE /HYSTEROSCOPY;  Surgeon: [name], MD;  Location: [facility];  Service: Gynecology;  Laterality: N/A;  Removal of expelled essure coil  . Mastectomy, partial  1994    right  . Breast biopsy Left 02/20/2013    Procedure: LEFT BREAST CENTRAL DUCT EXCISION;  Surgeon: [name], MD;  Location: [facility];  Service: General;  Laterality: Left;    Family History  Problem Relation Age of Onset  . COPD Mother   . Breast cancer Maternal Grandmother   . Tuberculosis Maternal Grandmother   . Stroke Maternal Grandmother   . Allergies Father   . Heart disease Father   . Stroke Father   . Heart attack Father   . Allergies Sister   . Deep vein thrombosis Sister     History   Social History  . Marital Status: Married    Spouse Name: N/A    Number of Children: N/A  . Years of Education: N/A   Occupational History  . homemaker    Social History Main  Topics  . Smoking status: Never Smoker   . Smokeless tobacco: Never Used  . Alcohol Use: 0.6 oz/week    1 Glasses of wine per week     Comment: occasionally   . Drug Use: No  . Sexual Activity: Yes    Birth Control/ Protection: Post-menopausal   Other Topics Concern  . Not on file   Social History Narrative   Lives in [place]   Has been married for 4 yerars.  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 albuterol (PROVENTIL HFA;VENTOLIN HFA) 108 (90 BASE) MCG/ACT inhaler Inhale 2 puffs into the lungs every 6 (six) hours as needed for wheezing or shortness of breath.  1 Inhaler  0  . ALPRAZolam (XANAX) 0.5 MG tablet Take 1 tablet (0.5 mg total) by mouth 3 (three) times daily as needed for sleep.  90 tablet   1  . buPROPion (WELLBUTRIN XL) 300 MG 24 hr tablet Take 1 tablet (300 mg total) by mouth daily.  30 tablet  5  . fluticasone (FLONASE) 50 MCG/ACT nasal spray Place 2 sprays into both nostrils daily.      . furosemide (LASIX) 40 MG tablet Take 40 mg by mouth daily as needed for fluid or edema.      . hyoscyamine (LEVSIN, ANASPAZ) 0.125 MG tablet Take 0.125 mg by mouth every 4 (four) hours as needed for cramping.      Marland Kitchen ibuprofen (ADVIL,MOTRIN) 200 MG tablet Take 600 mg by mouth every 6 (six) hours as needed for pain.      Marland Kitchen levofloxacin (LEVAQUIN) 750 MG tablet Take 1 tablet (750 mg total) by mouth daily.  6 tablet  0  . levothyroxine (SYNTHROID, LEVOTHROID) 75 MCG tablet take 1 tablet by mouth once daily  30 tablet  3  . predniSONE (DELTASONE) 10 MG tablet 4 tabs by mouth once daily x 2 days, then 3 tabs daily x 2 days, then 2 tabs daily x 2 days,then 1 tab daily x 2 days then stop.  20 tablet  0  . traZODone (DESYREL) 50 MG tablet Take 0.5 tablets (25 mg total) by mouth at bedtime.  30 tablet  5  . HYDROcodone-acetaminophen (NORCO) 10-325 MG per tablet Take 1 tablet by mouth every 6 (six) hours as needed for pain.       Marland Kitchen ondansetron (ZOFRAN) 4 MG tablet Take 1 tablet (4 mg total) by mouth every 8 (eight) hours as needed for nausea or vomiting.  20 tablet  0   No current facility-administered medications on file prior to visit.    Allergies  Allergen Reactions  . Codeine   . Cymbalta [Duloxetine Hcl]     Personality changes  . Lamictal [Lamotrigine]   . Morphine And Related   . Sertraline Hcl     Review of Systems  Review of Systems  Constitutional: Positive for malaise/fatigue. Negative for fever.  HENT: Positive for congestion.   Eyes: Negative for discharge.  Respiratory: Positive for shortness of breath.   Cardiovascular: Negative for chest pain, palpitations and leg swelling.  Gastrointestinal: Negative for nausea, abdominal pain and diarrhea.  Genitourinary: Negative for  dysuria.  Musculoskeletal: Negative for falls.  Skin: Negative for rash.  Neurological: Negative for loss of consciousness and headaches.  Endo/Heme/Allergies: Negative for polydipsia.  Psychiatric/Behavioral: Positive for depression. Negative for suicidal ideas. The patient is  nervous/anxious. The patient does not have insomnia.     Objective  BP 118/74  Pulse 105  Temp(Src) 98.8 F (37.1 C) (Oral)  Ht 5\' 5"  (1.651 m)  Wt 145 lb (65.772 kg)  BMI 24.13 kg/m2  SpO2 95%  Physical Exam  Physical Exam  Constitutional: She is oriented to person, place, and time and well-developed, well-nourished, and in no distress. No distress.  HENT:  Head: Normocephalic and atraumatic.  Eyes: Conjunctivae are normal.  Neck: Neck supple. No thyromegaly present.  Cardiovascular: Normal rate, regular rhythm and normal heart sounds.   No murmur heard. Pulmonary/Chest: Effort normal and breath sounds normal. She has no wheezes.  Abdominal: She exhibits no distension and no mass.  Musculoskeletal: She exhibits no edema.  Lymphadenopathy:    She has no cervical adenopathy.  Neurological: She is alert and oriented to person, place, and time.  Skin: Skin is warm and dry. No rash noted. She is not diaphoretic.  Psychiatric: Memory, affect and judgment normal.    Lab Results  Component Value Date   TSH 1.213 03/20/2013   Lab Results  Component Value Date   WBC 7.8 10/15/2013   HGB 16.2* 10/15/2013   HCT 47.5* 10/15/2013   MCV 96.9 10/15/2013   PLT 217 10/15/2013   Lab Results  Component Value Date   CREATININE 0.90 10/15/2013   BUN 21 10/15/2013   NA 137 10/15/2013   K 4.1 10/15/2013   CL 99 10/15/2013   CO2 24 10/15/2013   Lab Results  Component Value Date   ALT 20 07/16/2013   AST 28 07/16/2013   ALKPHOS 89 07/16/2013   BILITOT 0.6 07/16/2013   Lab Results  Component Value Date   CHOL 209* 09/27/2011   Lab Results  Component Value Date   HDL 50 09/27/2011   Lab Results  Component Value  Date   LDLCALC 143* 09/27/2011   Lab Results  Component Value Date   TRIG 79 09/27/2011   Lab Results  Component Value Date   CHOLHDL 4.2 09/27/2011     Assessment & Plan  Hypothyroidism On Levothyroxine, continue to monitor  Unspecified constipation Encouraged increased hydration and fiber in diet. Daily probiotics. If bowels not moving can use MOM 2 tbls po in 4 oz of warm prune juice by mouth every 2-3 days. If no results then repeat in 4 hours with  Dulcolax suppository pr, may repeat again in 4 more hours as needed. Seek care if symptoms worsen. Consider daily Miralax and/or Dulcolax if symptoms persist.   ANXIETY Has just been the care provider for her father and mother in law as they were dying. She is struggling but doing ok

## 2013-10-25 NOTE — Patient Instructions (Addendum)
Try calcium/magnesium daily, hydrate  Well, 64 oz daily Encouraged increased hydration and fiber in diet. Daily probiotics. If bowels not moving can use MOM 2 tbls po in 4 oz of warm prune juice by mouth every 2-3 days. If no results then repeat in 4 hours with  Dulcolax suppository pr, may repeat again in 4 more hours as needed. Seek care if symptoms worsen. Consider daily Miralax and/or Dulcolax if symptoms persist.  Hyland's Calms Forte for sleep and anxiety Hyland's Night time leg cramp med   Leg Cramps Leg cramps that occur during exercise can be caused by poor circulation or dehydration. However, muscle cramps that occur at rest or during the night are usually not due to any serious medical problem. Heat cramps may cause muscle spasms during hot weather.  CAUSES There is no clear cause for muscle cramps. However, dehydration may be a factor for those who do not drink enough fluids and those who exercise in the heat. Imbalances in the level of sodium, potassium, calcium or magnesium in the muscle tissue may also be a factor. Some medications, such as water pills (diuretics), may cause loss of chemicals that the body needs (like sodium and potassium) and cause muscle cramps. TREATMENT   Make sure your diet has enough fluids and essential minerals for the muscle to work normally.  Avoid strenuous exercise for several days if you have been having frequent leg cramps.  Stretch and massage the cramped muscle for several minutes.  Some medicines may be helpful in some patients with night cramps. Only take over-the-counter or prescription medicines as directed by your caregiver. SEEK IMMEDIATE MEDICAL CARE IF:   Your leg cramps become worse.  Your foot becomes cold, numb, or blue. Document Released: 07/22/2004 Document Revised: 09/06/2011 Document Reviewed: 07/09/2008 [facility] Patient Information 2014 [place].

## 2013-10-26 ENCOUNTER — Ambulatory Visit: Payer: Medicare Other | Admitting: Family

## 2013-10-28 NOTE — Assessment & Plan Note (Signed)
Encouraged increased hydration and fiber in diet. Daily probiotics. If bowels not moving can use MOM 2 tbls po in 4 oz of warm prune juice by mouth every 2-3 days. If no results then repeat in 4 hours with  Dulcolax suppository pr, may repeat again in 4 more hours as needed. Seek care if symptoms worsen. Consider daily Miralax and/or Dulcolax if symptoms persist.  

## 2013-10-28 NOTE — Assessment & Plan Note (Signed)
On Levothyroxine, continue to monitor 

## 2013-10-28 NOTE — Assessment & Plan Note (Signed)
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 She is struggling but doing ok

## 2013-10-29 ENCOUNTER — Telehealth: Payer: Self-pay | Admitting: Family Medicine

## 2013-10-29 MED ORDER — AMOXICILLIN-POT CLAVULANATE 875-125 MG PO TABS: 1.0000 | ORAL_TABLET | Freq: Two times a day (BID) | ORAL | Status: DC

## 2013-10-29 MED ORDER — METHYLPREDNISOLONE 4 MG PO KIT: PACK | ORAL | Status: DC

## 2013-10-29 NOTE — Telephone Encounter (Signed)
RX sent and pt informed 

## 2013-10-29 NOTE — Telephone Encounter (Signed)
Can send in Augmentin 875 mg 1 tab po bid x 10 days for Bronchitis/respiratory illness

## 2013-10-29 NOTE — Telephone Encounter (Signed)
She can have a Medrol dosepak to use as directed with the Augmentin, if no improvement come back in

## 2013-10-29 NOTE — Addendum Note (Signed)
Addended by: Varney Daily on: 10/29/2013 01:05 PM   Modules accepted: Orders

## 2013-10-29 NOTE — Telephone Encounter (Signed)
Patient states that she is not feeling any better since last visit. She states that Dr. Charlett Blake did mention about giving her another round of antiobiotics

## 2013-10-29 NOTE — Telephone Encounter (Signed)
RX sent and pt informed.   Pt also stated that md had said that when she was done with the steroid pack that she might have pt go to another one that is smaller strength?  Please advise?

## 2013-10-29 NOTE — Addendum Note (Signed)
Addended by: Varney Daily on: 10/29/2013 04:44 PM   Modules accepted: Orders, Medications

## 2013-11-19 ENCOUNTER — Other Ambulatory Visit: Payer: Self-pay | Admitting: Family Medicine

## 2013-11-20 NOTE — Telephone Encounter (Signed)
RX printed for md to sign and fax  Last RX was wrote on 03-20-13 quantity 90 with 1 refill

## 2013-11-23 ENCOUNTER — Encounter: Payer: Self-pay | Admitting: Family Medicine

## 2013-11-23 ENCOUNTER — Ambulatory Visit (INDEPENDENT_AMBULATORY_CARE_PROVIDER_SITE_OTHER): Payer: Medicare Other | Admitting: Family Medicine

## 2013-11-23 ENCOUNTER — Telehealth: Payer: Self-pay | Admitting: Family Medicine

## 2013-11-23 VITALS — BP 120/78 | HR 88 | Temp 97.8°F | Ht 65.0 in | Wt 149.0 lb

## 2013-11-23 DIAGNOSIS — E039 Hypothyroidism, unspecified: Secondary | ICD-10-CM

## 2013-11-23 DIAGNOSIS — E785 Hyperlipidemia, unspecified: Secondary | ICD-10-CM

## 2013-11-23 DIAGNOSIS — R110 Nausea: Secondary | ICD-10-CM

## 2013-11-23 DIAGNOSIS — N950 Postmenopausal bleeding: Secondary | ICD-10-CM

## 2013-11-23 DIAGNOSIS — G4700 Insomnia, unspecified: Secondary | ICD-10-CM

## 2013-11-23 DIAGNOSIS — R0789 Other chest pain: Secondary | ICD-10-CM

## 2013-11-23 DIAGNOSIS — K5900 Constipation, unspecified: Secondary | ICD-10-CM

## 2013-11-23 MED ORDER — ONDANSETRON HCL 8 MG PO TABS: 8.0000 mg | ORAL_TABLET | Freq: Three times a day (TID) | ORAL | Status: DC | PRN

## 2013-11-23 NOTE — Patient Instructions (Signed)

## 2013-11-23 NOTE — Telephone Encounter (Signed)
Return in about 10 weeks (around 02/01/2014) for lipid, renal, cbc, tsh, hepatic prior.   [place] [facility]

## 2013-11-23 NOTE — Progress Notes (Signed)
Patient ID: [name], female   DOB: 1955/09/14, 58 y.o.   MRN: [id number] SERAPHINE GUDIEL [id number] 1956/02/27 11/23/2013      Progress Note-Follow Up  Subjective  Chief Complaint  Chief Complaint  Patient presents with  . Follow-up    4 week    HPI  Patient is a 58 year old female in today for routine medical care. Is in today for followup. Continues to struggle with chronic pain and falls with Dr. Rolena Infante and Dr. Dossie Der of orthopedics for that. Has had some recent episodes of postmenopausal bleeding. They have been scant but she is very been told by her GYN Dr. Lisbeth Renshaw to return there if this recurs. She has been struggling with fatigue since her prednisone and that it is lifting. Her respiratory symptoms have improved. No sore throat cough. Has had some occasional palpitations and at other times some chest discomfort. No shortness of breath  Past Medical History  Diagnosis Date  . Allergic rhinitis   . Tremor   . Neuropathy   . Hypothyroidism   . History of migraine headaches     only one in the past  . Breast cancer   . Nonischemic cardiomyopathy     EF 50% 2014 at [facility]  . Congenital anomaly of superior vena cava     R Ht cath 7/12:  NL pressures, no pulmonary hypertension and normal wedge pressure; probable congenital anomaly with at least a left sided SVC going into the coronary sinus.  Right-sided sats were somewhat elevated, although they seem to be elevated uniformly from the PA, RA and RV.  consider bubble study from left arm or cardiac MRI or CT  . H/O hiatal hernia     found during endoscopy in 2012  . Arthritis     hip, knees, feet, ankles  . Depression with anxiety 12/28/2010  . CAD (coronary artery disease)     Nonobstructive by cath 7/12:  50% proximal LAD  . Postmenopausal bleeding 08/23/2012  . Vaginitis and vulvovaginitis 10/21/2012  . Left knee pain 02/11/2013  . Pneumonia unknown    hx  . TMJ (dislocation of temporomandibular joint) 03/20/2013  . Thyromegaly  03/21/2013    Past Surgical History  Procedure Laterality Date  . Bone marrow transplant  01/95  . Cervical conization w/bx    . Arthoscopy      rt. knee  . Axillary node dissection Right 94  . Port-a-cath removed    . Hickman placed    . Biopsy breast      L & R  . Combined hysteroscopy diagnostic / d&c    . Hysteroscopy w/d&c N/A 08/23/2012    Procedure: DILATATION AND CURETTAGE /HYSTEROSCOPY;  Surgeon: [name], MD;  Location: [facility];  Service: Gynecology;  Laterality: N/A;  Removal of expelled essure coil  . Mastectomy, partial  1994    right  . Breast biopsy Left 02/20/2013    Procedure: LEFT BREAST CENTRAL DUCT EXCISION;  Surgeon: [name], MD;  Location: [facility];  Service: General;  Laterality: Left;    Family History  Problem Relation Age of Onset  . COPD Mother   . Breast cancer Maternal Grandmother   . Tuberculosis Maternal Grandmother   . Stroke Maternal Grandmother   . Allergies Father   . Heart disease Father   . Stroke Father   . Heart attack Father   . Allergies Sister   . Deep vein thrombosis Sister     History  Social History  . Marital Status: Married    Spouse Name: N/A    Number of Children: N/A  . Years of Education: N/A   Occupational History  . homemaker    Social History Main Topics  . Smoking status: Never Smoker   . Smokeless tobacco: Never Used  . Alcohol Use: 0.6 oz/week    1 Glasses of wine per week     Comment: occasionally   . Drug Use: No  . Sexual Activity: Yes    Birth Control/ Protection: Post-menopausal   Other Topics Concern  . Not on file   Social History Narrative   Lives in [place]   Has been married for 4 yerars.  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 albuterol (PROVENTIL HFA;VENTOLIN HFA) 108 (90 BASE) MCG/ACT inhaler Inhale 2 puffs into the lungs every 6  (six) hours as needed for wheezing or shortness of breath.  1 Inhaler  0  . ALPRAZolam (XANAX) 0.5 MG tablet take 1 tablet by mouth three times a day if needed for sleep  90 tablet  1  . Ascorbic Acid (VITAMIN C) 1000 MG tablet Take 1,000 mg by mouth daily.      Marland Kitchen b complex vitamins tablet Take 1 tablet by mouth daily.      Marland Kitchen buPROPion (WELLBUTRIN XL) 300 MG 24 hr tablet Take 1 tablet (300 mg total) by mouth daily.  30 tablet  5  . CALCIUM CITRATE PO Take by mouth daily.      . Cholecalciferol (VITAMIN D3 PO) Take 2,500 mg by mouth daily.      . fluticasone (FLONASE) 50 MCG/ACT nasal spray Place 2 sprays into both nostrils daily.      . furosemide (LASIX) 40 MG tablet Take 40 mg by mouth daily as needed for fluid or edema.      Marland Kitchen HYDROcodone-acetaminophen (NORCO) 10-325 MG per tablet Take 1 tablet by mouth every 6 (six) hours as needed for pain.       . hyoscyamine (LEVSIN, ANASPAZ) 0.125 MG tablet Take 0.125 mg by mouth every 4 (four) hours as needed for cramping.      Marland Kitchen ibuprofen (ADVIL,MOTRIN) 200 MG tablet Take 600 mg by mouth every 6 (six) hours as needed for pain.      Marland Kitchen levothyroxine (SYNTHROID, LEVOTHROID) 75 MCG tablet take 1 tablet by mouth once daily  30 tablet  3  . Multiple Vitamin (MULTIVITAMIN) tablet Take 1 tablet by mouth daily.      . ondansetron (ZOFRAN) 4 MG tablet Take 1 tablet (4 mg total) by mouth every 8 (eight) hours as needed for nausea or vomiting.  20 tablet  0  . traZODone (DESYREL) 50 MG tablet Take 0.5 tablets (25 mg total) by mouth at bedtime.  30 tablet  5  . vitamin E 400 UNIT capsule Take 400 Units by mouth daily.       No current facility-administered medications on file prior to visit.    Allergies  Allergen Reactions  . Codeine   . Cymbalta [Duloxetine Hcl]     Personality changes  . Lamictal [Lamotrigine]   . Morphine And Related   . Sertraline Hcl     Review of Systems  Review of Systems  Constitutional: Negative for fever and malaise/fatigue.   HENT: Negative for congestion.   Eyes: Negative for discharge.  Respiratory: Negative for shortness of breath.   Cardiovascular: Negative for chest pain, palpitations and leg swelling.  Gastrointestinal: Negative for nausea, abdominal pain and diarrhea.  Genitourinary: Negative for dysuria.  Musculoskeletal: Negative for falls.  Skin: Negative for rash.  Neurological: Negative for loss of consciousness and headaches.  Endo/Heme/Allergies: Negative for polydipsia.  Psychiatric/Behavioral: Negative for depression and suicidal ideas. The patient is not nervous/anxious and does not have insomnia.     Objective  BP 120/78  Pulse 88  Temp(Src) 97.8 F (36.6 C) (Oral)  Ht 5\' 5"  (1.651 m)  Wt 149 lb (67.586 kg)  BMI 24.79 kg/m2  SpO2 97%  Physical Exam  Physical Exam  Constitutional: She is well-developed, well-nourished, and in no distress. No distress.  HENT:  Left Ear: External ear normal.  Mouth/Throat: No oropharyngeal exudate.  Eyes: EOM are normal. Left eye exhibits no discharge. No scleral icterus.  Neck: No JVD present. No tracheal deviation present.  Cardiovascular: Normal heart sounds and intact distal pulses.   Pulmonary/Chest: No respiratory distress. She has no rales.  Abdominal: She exhibits no distension and no mass. There is tenderness. There is no guarding.  Musculoskeletal: She exhibits no edema and no tenderness.  Lymphadenopathy:    She has no cervical adenopathy.  Skin: No rash noted. No erythema.    Lab Results  Component Value Date   TSH 1.213 03/20/2013   Lab Results  Component Value Date   WBC 7.8 10/15/2013   HGB 16.2* 10/15/2013   HCT 47.5* 10/15/2013   MCV 96.9 10/15/2013   PLT 217 10/15/2013   Lab Results  Component Value Date   CREATININE 0.90 10/15/2013   BUN 21 10/15/2013   NA 137 10/15/2013   K 4.1 10/15/2013   CL 99 10/15/2013   CO2 24 10/15/2013   Lab Results  Component Value Date   ALT 20 07/16/2013   AST 28 07/16/2013   ALKPHOS 89  07/16/2013   BILITOT 0.6 07/16/2013   Lab Results  Component Value Date   CHOL 209* 09/27/2011   Lab Results  Component Value Date   HDL 50 09/27/2011   Lab Results  Component Value Date   LDLCALC 143* 09/27/2011   Lab Results  Component Value Date   TRIG 79 09/27/2011   Lab Results  Component Value Date   CHOLHDL 4.2 09/27/2011     Assessment & Plan  Unspecified constipation Encouraged increased hydration and fiber in diet. Daily probiotics. If bowels not moving can use MOM 2 tbls po in 4 oz of warm prune juice by mouth every 2-3 days. If no results then repeat in 4 hours with  Dulcolax suppository pr, may repeat again in 4 more hours as needed. Seek care if symptoms worsen. Consider daily Miralax and/or Dulcolax if symptoms persist.   Hypothyroidism On Levothyroxine, continue to monitor  Postmenopausal bleeding Is going to follow up with GYN for further work up. She has had scant bleeding recently  Insomnia Responding to Trazodone  Atypical chest pain Agrees to follow up with her cardiologist whom she is due to see. Seek care if worsens or does not resolve

## 2013-11-23 NOTE — Progress Notes (Signed)
Pre visit review using our clinic review tool, if applicable. No additional management support is needed unless otherwise documented below in the visit note. 

## 2013-11-25 ENCOUNTER — Encounter: Payer: Self-pay | Admitting: Family Medicine

## 2013-11-25 NOTE — Assessment & Plan Note (Signed)
Encouraged increased hydration and fiber in diet. Daily probiotics. If bowels not moving can use MOM 2 tbls po in 4 oz of warm prune juice by mouth every 2-3 days. If no results then repeat in 4 hours with  Dulcolax suppository pr, may repeat again in 4 more hours as needed. Seek care if symptoms worsen. Consider daily Miralax and/or Dulcolax if symptoms persist.  

## 2013-11-25 NOTE — Assessment & Plan Note (Signed)
Is going to follow up with GYN for further work up. She has had scant bleeding recently

## 2013-11-25 NOTE — Assessment & Plan Note (Signed)
Responding to Trazodone

## 2013-11-25 NOTE — Assessment & Plan Note (Signed)
On Levothyroxine, continue to monitor 

## 2013-11-25 NOTE — Assessment & Plan Note (Signed)
Agrees to follow up with her cardiologist whom she is due to see. Seek care if worsens or does not resolve

## 2013-11-30 ENCOUNTER — Other Ambulatory Visit: Payer: Self-pay

## 2013-12-03 ENCOUNTER — Other Ambulatory Visit: Payer: Self-pay

## 2013-12-03 DIAGNOSIS — Z1231 Encounter for screening mammogram for malignant neoplasm of breast: Secondary | ICD-10-CM

## 2013-12-14 ENCOUNTER — Encounter: Payer: Self-pay | Admitting: Family Medicine

## 2013-12-22 ENCOUNTER — Emergency Department (HOSPITAL_COMMUNITY)
Admission: EM | Admit: 2013-12-22 | Discharge: 2013-12-22 | Disposition: A | Discharge status: Home / Self Care | Payer: Medicare Other | Source: Home / Self Care | Attending: Emergency Medicine | Admitting: Emergency Medicine

## 2013-12-22 ENCOUNTER — Emergency Department (INDEPENDENT_AMBULATORY_CARE_PROVIDER_SITE_OTHER): Payer: Medicare Other

## 2013-12-22 ENCOUNTER — Encounter (HOSPITAL_COMMUNITY): Payer: Self-pay | Admitting: Emergency Medicine

## 2013-12-22 DIAGNOSIS — Y92009 Unspecified place in unspecified non-institutional (private) residence as the place of occurrence of the external cause: Secondary | ICD-10-CM

## 2013-12-22 DIAGNOSIS — W1809XA Striking against other object with subsequent fall, initial encounter: Secondary | ICD-10-CM

## 2013-12-22 DIAGNOSIS — S20219A Contusion of unspecified front wall of thorax, initial encounter: Secondary | ICD-10-CM

## 2013-12-22 DIAGNOSIS — S20212A Contusion of left front wall of thorax, initial encounter: Secondary | ICD-10-CM

## 2013-12-22 DIAGNOSIS — W19XXXA Unspecified fall, initial encounter: Secondary | ICD-10-CM

## 2013-12-22 MED ORDER — OXYCODONE-ACETAMINOPHEN 5-325 MG PO TABS: ORAL_TABLET | ORAL | Status: DC

## 2013-12-22 NOTE — ED Provider Notes (Signed)
Chief Complaint   Chief Complaint  Patient presents with  . Fall    History of Present Illness   [name] is a 58 year old female who fell this past Friday morning at 1:30 AM which was a day and a half ago. The patient has suddenly gotten up out of bed to go to the bathroom. She thinks she got up too quickly, she felt lightheaded and dizzy, she did not turn the light on the bathroom, and tripped and fell across the lip of the bathtub, striking her left, lateral rib cage on the edge of the bathtub and also she did hit her head. She did not faint or pass out and there was no loss of consciousness. Ever since then she's had pain in her left lateral rib cage area which hurts worse to take a deep breath, cough, disease, or to laugh and she also has a slight headache. She denies any swelling or hematoma formation on the head. She's had no bleeding from the nose or the ears, no diplopia or blurred vision. She has no loose or broken teeth and denies any neck pain. She did have a root canal on tooth #15 and this is painful and the gingiva is swollen. She's had some sores inside her mouth. She denies any anterior chest pain, upper lower back pain, or abdominal pain. She's had no nausea or vomiting. She denies any extremity pain, paresthesias, or weakness.  Review of Systems   Other than as noted above, the patient denies any of the following symptoms: ENT:  No headache, facial pain, or bleeding from the nose or ears.  No loose or broken teeth. Neck:  No neck pain or stiffnes. Cardiac:  No chest pain. No palpitations, dizziness, syncope or fainting. GI:  No abdominal pain. No nausea, vomiting, or diarrhea. M-S:  No extremity pain, swelling, bruising, limited ROM, or back pain. Neuro:  No loss of consciousness, seizure activity, dizziness, vertigo, paresthesias, numbness, or weakness.  No difficulty with speech or ambulation.  [place]   Past medical history, family history, social history, meds, and  allergies were reviewed.  She has a history of breast cancer 1995. She underwent a bone marrow transplant. She also has hypothyroidism. She takes Synthroid and Wellbutrin. She has allergies to morphine, Zoloft, and Cymbalta.  Physical Examination    Vital signs:  BP 119/81  Pulse 102  Temp(Src) 98.9 F (37.2 C) (Oral)  Resp 20  SpO2 98% General:  Alert, oriented and in no distress. Eye:  PERRL, full EOMs. ENT:  No cranial or facial tenderness to palpation. Neck:  No tenderness to palpation.  Full ROM without pain. Heart:  Regular rhythm.  No extrasystoles, gallops, or murmers. Lungs:  No swelling, bruising, or deformity. There is tenderness to palpation over the left, lateral rib cage area. Breath sounds clear and equal bilaterally.  No wheezes, rales or rhonchi. Abdomen:  Non tender. Back:  Non tender to palpation.  Full ROM without pain. Extremities:  No tenderness, swelling, bruising or deformity.  Full ROM of all joints without pain.  Pulses full.  Brisk capillary refill. Neuro:  Alert and oriented times 3.  Cranial nerves intact.  No muscle weakness.  Sensation intact to light touch.  Gait normal. Skin:  No bruising, abrasions, or lacerations.  Radiology   Dg Ribs Unilateral W/chest Left  12/22/2013   CLINICAL DATA:  History of breast cancer. Patient status post fall. Left-sided chest pain.  EXAM: LEFT RIBS AND CHEST - 3+ VIEW  COMPARISON:  Chest radiograph 10/15/2013.  FINDINGS: Stable cardiac and mediastinal contours. Lungs are clear. No pleural effusion or pneumothorax. Surgical clips within the right axillary region. No evidence for displaced left rib fracture.  IMPRESSION: No evidence for displaced left rib fracture.  No acute cardiopulmonary process.   Electronically Signed   By: Lovey Newcomer M.D.   On: 12/22/2013 19:42   Assessment   The primary encounter diagnosis was Chest wall contusion, left, initial encounter. Diagnoses of Fall with injury and Place of occurrence, home  were also pertinent to this visit.  No evidence of significant head injury. No evidence of rib fracture or pneumothorax. No evidence of abdominal or splenic injury.  Plan   1.  Meds:  The following meds were prescribed:   Discharge Medication List as of 12/22/2013  7:52 PM    START taking these medications   Details  oxyCODONE-acetaminophen (PERCOCET) 5-325 MG per tablet 1 to 2 tablets every 6 hours as needed for pain., Print        2.  Patient Education/Counseling:  The patient was given appropriate handouts, self care instructions, and instructed in symptomatic relief.    3.  Follow up:  The patient was told to follow up here if no better in 3 to 4 days, or sooner if becoming worse in any way, and given some red flag symptoms such as increasing pain or new neurological symptoms which would prompt immediate return.  Follow up here if necessary.     Harden Mo, MD 12/22/13 2006

## 2013-12-22 NOTE — ED Notes (Signed)
Reports falling halfway into bathtub 2 nights ago, hitting head on side of tub and left ribcage on tub ledge.  Denies LOC.  Denies n/v or neck pain.  C/O increasing pain to left lower ribcage with painful movements, coughing, breathing, etc.  Also c/o left HA.  Has been using heating pad.

## 2013-12-22 NOTE — Discharge Instructions (Signed)
Fall Prevention and Home Safety Falls cause injuries and can affect all age groups. It is possible to use preventive measures to significantly decrease the likelihood of falls. There are many simple measures which can make your home safer and prevent falls. OUTDOORS  Repair cracks and edges of walkways and driveways.  Remove high doorway thresholds.  Trim shrubbery on the main path into your home.  Have good outside lighting.  Clear walkways of tools, rocks, debris, and clutter.  Check that handrails are not broken and are securely fastened. Both sides of steps should have handrails.  Have leaves, snow, and ice cleared regularly.  Use sand or salt on walkways during winter months.  In the garage, clean up grease or oil spills. BATHROOM  Install night lights.  Install grab bars by the toilet and in the tub and shower.  Use non-skid mats or decals in the tub or shower.  Place a plastic non-slip stool in the shower to sit on, if needed.  Keep floors dry and clean up all water on the floor immediately.  Remove soap buildup in the tub or shower on a regular basis.  Secure bath mats with non-slip, double-sided rug tape.  Remove throw rugs and tripping hazards from the floors. BEDROOMS  Install night lights.  Make sure a bedside light is easy to reach.  Do not use oversized bedding.  Keep a telephone by your bedside.  Have a firm chair with side arms to use for getting dressed.  Remove throw rugs and tripping hazards from the floor. KITCHEN  Keep handles on pots and pans turned toward the center of the stove. Use back burners when possible.  Clean up spills quickly and allow time for drying.  Avoid walking on wet floors.  Avoid hot utensils and knives.  Position shelves so they are not too high or low.  Place commonly used objects within easy reach.  If necessary, use a sturdy step stool with a grab bar when reaching.  Keep electrical cables out of the  way.  Do not use floor polish or wax that makes floors slippery. If you must use wax, use non-skid floor wax.  Remove throw rugs and tripping hazards from the floor. STAIRWAYS  Never leave objects on stairs.  Place handrails on both sides of stairways and use them. Fix any loose handrails. Make sure handrails on both sides of the stairways are as long as the stairs.  Check carpeting to make sure it is firmly attached along stairs. Make repairs to worn or loose carpet promptly.  Avoid placing throw rugs at the top or bottom of stairways, or properly secure the rug with carpet tape to prevent slippage. Get rid of throw rugs, if possible.  Have an electrician put in a light switch at the top and bottom of the stairs. OTHER FALL PREVENTION TIPS  Wear low-heel or rubber-soled shoes that are supportive and fit well. Wear closed toe shoes.  When using a stepladder, make sure it is fully opened and both spreaders are firmly locked. Do not climb a closed stepladder.  Add color or contrast paint or tape to grab bars and handrails in your home. Place contrasting color strips on first and last steps.  Learn and use mobility aids as needed. Install an electrical emergency response system.  Turn on lights to avoid dark areas. Replace light bulbs that burn out immediately. Get light switches that glow.  Arrange furniture to create clear pathways. Keep furniture in the same place.  Firmly attach carpet with non-skid or double-sided tape. °· Eliminate uneven floor surfaces. °· Select a carpet pattern that does not visually hide the edge of steps. °· Be aware of all pets. °OTHER HOME SAFETY TIPS °· Set the water temperature for 120° F (48.8° C). °· Keep emergency numbers on or near the telephone. °· Keep smoke detectors on every level of the home and near sleeping areas. °Document Released: 06/04/2002 Document Revised: 12/14/2011 Document Reviewed: 09/03/2011 °[employer]® Patient Information ©2015  [employer]. This information is not intended to replace advice given to you by your health care provider. Make sure you discuss any questions you have with your health care provider. ° °Rib Fracture °A rib fracture is a break or crack in one of the bones of the ribs. The ribs are a group of long, curved bones that wrap around your chest and attach to your spine. They protect your lungs and other organs in the chest cavity. A broken or cracked rib is often painful, but most do not cause other problems. Most rib fractures heal on their own over time. However, rib fractures can be more serious if multiple ribs are broken or if broken ribs move out of place and push against other structures. °CAUSES  °· A direct blow to the chest. For example, this could happen during contact sports, a car accident, or a fall against a hard object. °· Repetitive movements with high force, such as pitching a baseball or having severe coughing spells. °SYMPTOMS  °· Pain when you breathe in or cough. °· Pain when someone presses on the injured area. °DIAGNOSIS  °Your caregiver will perform a physical exam. Various imaging tests may be ordered to confirm the diagnosis and to look for related injuries. These tests may include a chest X-ray, computed tomography (CT), magnetic resonance imaging (MRI), or a bone scan. °TREATMENT  °Rib fractures usually heal on their own in 1-3 months. The longer healing period is often associated with a continued cough or other aggravating activities. During the healing period, pain control is very important. Medication is usually given to control pain. Hospitalization or surgery may be needed for more severe injuries, such as those in which multiple ribs are broken or the ribs have moved out of place.  °HOME CARE INSTRUCTIONS  °· Avoid strenuous activity and any activities or movements that cause pain. Be careful during activities and avoid bumping the injured rib. °· Gradually increase activity as directed  by your caregiver. °· Only take over-the-counter or prescription medications as directed by your caregiver. Do not take other medications without asking your caregiver first. °· Apply ice to the injured area for the first 1-2 days after you have been treated or as directed by your caregiver. Applying ice helps to reduce inflammation and pain. °¨ Put ice in a plastic bag. °¨ Place a towel between your skin and the bag.   °¨ Leave the ice on for 15-20 minutes at a time, every 2 hours while you are awake. °· Perform deep breathing as directed by your caregiver. This will help prevent pneumonia, which is a common complication of a broken rib. Your caregiver may instruct you to: °¨ Take deep breaths several times a day. °¨ Try to cough several times a day, holding a pillow against the injured area. °¨ Use a device called an incentive spirometer to practice deep breathing several times a day. °· Drink enough fluids to keep your urine clear or pale yellow. This will help you avoid constipation.   °·   Do not wear a rib belt or binder. These restrict breathing, which can lead to pneumonia.   °SEEK IMMEDIATE MEDICAL CARE IF:  °· You have a fever.   °· You have difficulty breathing or shortness of breath.   °· You develop a continual cough, or you cough up thick or bloody sputum. °· You feel sick to your stomach (nausea), throw up (vomit), or have abdominal pain.   °· You have worsening pain not controlled with medications.   °MAKE SURE YOU: °· Understand these instructions. °· Will watch your condition. °· Will get help right away if you are not doing well or get worse. °Document Released: 06/14/2005 Document Revised: 02/14/2013 Document Reviewed: 08/16/2012 °[employer]® Patient Information ©2015 [employer]. This information is not intended to replace advice given to you by your health care provider. Make sure you discuss any questions you have with your health care provider. ° °

## 2013-12-24 ENCOUNTER — Emergency Department (HOSPITAL_COMMUNITY): Payer: Medicare Other

## 2013-12-24 ENCOUNTER — Telehealth: Payer: Self-pay | Admitting: Family Medicine

## 2013-12-24 ENCOUNTER — Encounter (HOSPITAL_COMMUNITY): Payer: Self-pay | Admitting: Emergency Medicine

## 2013-12-24 ENCOUNTER — Emergency Department (HOSPITAL_COMMUNITY)
Admission: EM | Admit: 2013-12-24 | Discharge: 2013-12-24 | Disposition: A | Discharge status: Home / Self Care | Payer: Medicare Other | Attending: Emergency Medicine | Admitting: Emergency Medicine

## 2013-12-24 DIAGNOSIS — T85848A Pain due to other internal prosthetic devices, implants and grafts, initial encounter: Secondary | ICD-10-CM

## 2013-12-24 DIAGNOSIS — R6884 Jaw pain: Secondary | ICD-10-CM | POA: Insufficient documentation

## 2013-12-24 DIAGNOSIS — G43909 Migraine, unspecified, not intractable, without status migrainosus: Secondary | ICD-10-CM | POA: Insufficient documentation

## 2013-12-24 DIAGNOSIS — F411 Generalized anxiety disorder: Secondary | ICD-10-CM | POA: Insufficient documentation

## 2013-12-24 DIAGNOSIS — Q268 Other congenital malformations of great veins: Secondary | ICD-10-CM | POA: Insufficient documentation

## 2013-12-24 DIAGNOSIS — M129 Arthropathy, unspecified: Secondary | ICD-10-CM | POA: Insufficient documentation

## 2013-12-24 DIAGNOSIS — F3289 Other specified depressive episodes: Secondary | ICD-10-CM | POA: Insufficient documentation

## 2013-12-24 DIAGNOSIS — Z8701 Personal history of pneumonia (recurrent): Secondary | ICD-10-CM | POA: Insufficient documentation

## 2013-12-24 DIAGNOSIS — I428 Other cardiomyopathies: Secondary | ICD-10-CM | POA: Insufficient documentation

## 2013-12-24 DIAGNOSIS — R509 Fever, unspecified: Secondary | ICD-10-CM | POA: Insufficient documentation

## 2013-12-24 DIAGNOSIS — Z853 Personal history of malignant neoplasm of breast: Secondary | ICD-10-CM | POA: Insufficient documentation

## 2013-12-24 DIAGNOSIS — K1370 Unspecified lesions of oral mucosa: Secondary | ICD-10-CM | POA: Insufficient documentation

## 2013-12-24 DIAGNOSIS — G608 Other hereditary and idiopathic neuropathies: Secondary | ICD-10-CM | POA: Insufficient documentation

## 2013-12-24 DIAGNOSIS — R51 Headache: Secondary | ICD-10-CM | POA: Insufficient documentation

## 2013-12-24 DIAGNOSIS — E049 Nontoxic goiter, unspecified: Secondary | ICD-10-CM | POA: Insufficient documentation

## 2013-12-24 DIAGNOSIS — I2510 Atherosclerotic heart disease of native coronary artery without angina pectoris: Secondary | ICD-10-CM | POA: Insufficient documentation

## 2013-12-24 DIAGNOSIS — G8918 Other acute postprocedural pain: Secondary | ICD-10-CM

## 2013-12-24 DIAGNOSIS — R221 Localized swelling, mass and lump, neck: Secondary | ICD-10-CM

## 2013-12-24 DIAGNOSIS — H5710 Ocular pain, unspecified eye: Secondary | ICD-10-CM | POA: Insufficient documentation

## 2013-12-24 DIAGNOSIS — Z8742 Personal history of other diseases of the female genital tract: Secondary | ICD-10-CM | POA: Insufficient documentation

## 2013-12-24 DIAGNOSIS — F329 Major depressive disorder, single episode, unspecified: Secondary | ICD-10-CM | POA: Insufficient documentation

## 2013-12-24 DIAGNOSIS — Z79899 Other long term (current) drug therapy: Secondary | ICD-10-CM | POA: Insufficient documentation

## 2013-12-24 DIAGNOSIS — R220 Localized swelling, mass and lump, head: Secondary | ICD-10-CM | POA: Insufficient documentation

## 2013-12-24 LAB — CBC WITH DIFFERENTIAL/PLATELET
Basophils Absolute: 0 10*3/uL (ref 0.0–0.1)
Basophils Relative: 1 % (ref 0–1)
Eosinophils Absolute: 0.2 10*3/uL (ref 0.0–0.7)
Eosinophils Relative: 3 % (ref 0–5)
HCT: 36.5 % (ref 36.0–46.0)
Hemoglobin: 12.3 g/dL (ref 12.0–15.0)
Lymphocytes Relative: 31 % (ref 12–46)
Lymphs Abs: 1.8 10*3/uL (ref 0.7–4.0)
MCH: 31.9 pg (ref 26.0–34.0)
MCHC: 33.7 g/dL (ref 30.0–36.0)
MCV: 94.8 fL (ref 78.0–100.0)
Monocytes Absolute: 0.7 10*3/uL (ref 0.1–1.0)
Monocytes Relative: 11 % (ref 3–12)
Neutro Abs: 3.2 10*3/uL (ref 1.7–7.7)
Neutrophils Relative %: 54 % (ref 43–77)
Platelets: 233 10*3/uL (ref 150–400)
RBC: 3.85 MIL/uL — ABNORMAL LOW (ref 3.87–5.11)
RDW: 12.3 % (ref 11.5–15.5)
WBC: 5.9 10*3/uL (ref 4.0–10.5)

## 2013-12-24 LAB — BASIC METABOLIC PANEL
BUN: 20 mg/dL (ref 6–23)
CO2: 26 mEq/L (ref 19–32)
Calcium: 8.7 mg/dL (ref 8.4–10.5)
Chloride: 98 mEq/L (ref 96–112)
Creatinine, Ser: 0.82 mg/dL (ref 0.50–1.10)
GFR calc Af Amer: >90 mL/min (ref >90)
GFR calc non Af Amer: 79 mL/min — ABNORMAL LOW (ref >90)
Glucose, Bld: 77 mg/dL (ref 70–99)
Potassium: 3.9 mEq/L (ref 3.7–5.3)
Sodium: 136 mEq/L — ABNORMAL LOW (ref 137–147)

## 2013-12-24 MED ORDER — IOHEXOL 300 MG/ML  SOLN
100.0000 mL | Freq: Once | INTRAMUSCULAR | Status: AC | PRN
  Administered 2013-12-24: 100 mL via INTRAVENOUS

## 2013-12-24 MED ORDER — TRAMADOL HCL 50 MG PO TABS: 50.0000 mg | ORAL_TABLET | Freq: Four times a day (QID) | ORAL | Status: DC | PRN

## 2013-12-24 NOTE — Telephone Encounter (Signed)
Verbal per MD pt should go to the ER for a CT of head for the pain on left side and lab work done. Pt informed and voiced understanding

## 2013-12-24 NOTE — Discharge Instructions (Signed)
Root Canal Treatment Root canal treatment is used to save or repair the inside of the tooth. This procedure is also called endodontic treatment. The inside of the tooth is made up of pulp. When the pulp is diseased or injured through trauma, a type of germ (bacteria) can enter the tooth. If the pulp is unable to repair itself, an infection can occur. Root canal is the removal of the infected pulp to save the tooth. Otherwise, the infected tooth may have to be removed. SYMPTOMS  Symptoms that indicate that a root canal may be necessary include:  Severe toothache or pain.  Continual sensitivity to anything hot or cold.  Tooth darkening (discoloration).  Swelling or tenderness of the gum.  Pain when biting down.  Swelling or redness of the face. DIAGNOSIS  A diagnosis is be based on results from:  A physical exam.  An X-ray exam. TREATMENT  Removal of infected tooth pulp includes:  Creating an opening through the portion of the tooth that is above the gums (crown) into the inner portion of the tooth (pulp chamber).  Removing pulp tissue from the pulp chamber and root canals.  Cleaning and shaping the root canals with special files.  Placing medication in the pulp chamber and root canals to remove bacteria.  Filling and sealing the pulp chamber and root canals to prevent bacteria from re-entering the tooth.  Sealing the crown of the tooth with a temporary filling. This temporary filling will be replaced with a permanent filling or crown.  Only take over-the-counter or prescription medicines for pain, discomfort, fever, or infection as directed by your caregiver. SEEK IMMEDIATE DENTAL CARE IF:  Pain cannot be helped by medication.  Any swelling occurs in your face or around the infected tooth.  You have a temperature by mouth above 102 F (38.9 C), not controlled by medicine.  You cannot swallow or open your mouth. Document Released: 12/02/2009 Document Revised: 09/06/2011  Document Reviewed: 12/02/2009 [facility] Patient Information 2015 [place], [place]. This information is not intended to replace advice given to you by your health care provider. Make sure you discuss any questions you have with your health care provider.

## 2013-12-24 NOTE — ED Provider Notes (Signed)
CSN: [id number]     Arrival date & time 12/24/13  1611 History   First MD Initiated Contact with Patient 12/24/13 1837     Chief Complaint  Patient presents with  . Jaw Pain  . Facial Pain  . Eye Pain    pressure on l/side of face one week after root canal     (Consider location/radiation/quality/duration/timing/severity/associated sxs/prior Treatment) Patient is a 58 y.o. female presenting with tooth pain. The history is provided by the patient. No language interpreter was used.  Dental Pain Location:  Upper Upper teeth location:  15/LU 2nd molar Quality:  Aching, burning and constant Severity:  Moderate Duration:  8 days Timing:  Constant Progression:  Worsening Chronicity:  New Context: recent dental surgery (root canal 8 days ago)   Relieved by:  Nothing Worsened by:  Nothing tried Ineffective treatments: hydrocodone. Associated symptoms: facial pain, facial swelling, fever, gum swelling, headaches and oral lesions   Associated symptoms: no congestion, no difficulty swallowing, no drooling, no neck pain, no neck swelling and no trismus   Fever:    Duration:  2 days   Timing:  Intermittent   Max temp PTA (F):  99.5   Temp source:  Oral Headaches:    Severity:  Moderate   Duration:  2 days   Timing:  Constant   Progression:  Unchanged   Chronicity:  New Risk factors: periodontal disease     Past Medical History  Diagnosis Date  . Allergic rhinitis   . Tremor   . Neuropathy   . Hypothyroidism   . History of migraine headaches     only one in the past  . Nonischemic cardiomyopathy     EF 50% 2014 at [facility]  . Congenital anomaly of superior vena cava     R Ht cath 7/12:  NL pressures, no pulmonary hypertension and normal wedge pressure; probable congenital anomaly with at least a left sided SVC going into the coronary sinus.  Right-sided sats were somewhat elevated, although they seem to be elevated uniformly from the PA, RA and RV.  consider bubble study from left  arm or cardiac MRI or CT  . H/O hiatal hernia     found during endoscopy in 2012  . Arthritis     hip, knees, feet, ankles  . Depression with anxiety 12/28/2010  . CAD (coronary artery disease)     Nonobstructive by cath 7/12:  50% proximal LAD  . Postmenopausal bleeding 08/23/2012  . Vaginitis and vulvovaginitis 10/21/2012  . Left knee pain 02/11/2013  . Pneumonia unknown    hx  . TMJ (dislocation of temporomandibular joint) 03/20/2013  . Thyromegaly 03/21/2013  . Atypical chest pain 09/26/2013  . Breast cancer    Past Surgical History  Procedure Laterality Date  . Bone marrow transplant  01/95  . Cervical conization w/bx    . Arthoscopy      rt. knee  . Axillary node dissection Right 94  . Port-a-cath removed    . Hickman placed    . Biopsy breast      L & R  . Combined hysteroscopy diagnostic / d&c    . Hysteroscopy w/d&c N/A 08/23/2012    Procedure: DILATATION AND CURETTAGE /HYSTEROSCOPY;  Surgeon: [name], MD;  Location: [facility];  Service: Gynecology;  Laterality: N/A;  Removal of expelled essure coil  . Breast biopsy Left 02/20/2013    Procedure: LEFT BREAST CENTRAL DUCT EXCISION;  Surgeon: [name], MD;  Location: [facility];  Service: General;  Laterality: Left;  . Bone marrow harvest    . Mastectomy, partial  1994    right   Family History  Problem Relation Age of Onset  . COPD Mother   . Breast cancer Maternal Grandmother   . Tuberculosis Maternal Grandmother   . Stroke Maternal Grandmother   . Allergies Father   . Heart disease Father   . Stroke Father   . Heart attack Father   . Allergies Sister   . Deep vein thrombosis Sister    History  Substance Use Topics  . Smoking status: Never Smoker   . Smokeless tobacco: Never Used  . Alcohol Use: Yes     Comment: occasionally    OB History   Grav Para Term Preterm Abortions TAB SAB Ect Mult Living                 Review of Systems  Constitutional: Positive for fever. Negative for chills,  diaphoresis, activity change, appetite change and fatigue.  HENT: Positive for facial swelling and mouth sores. Negative for congestion, drooling, rhinorrhea and sore throat.   Eyes: Positive for pain. Negative for photophobia and discharge.  Respiratory: Negative for cough, chest tightness and shortness of breath.   Cardiovascular: Negative for chest pain, palpitations and leg swelling.  Gastrointestinal: Negative for nausea, vomiting, abdominal pain and diarrhea.  Endocrine: Negative for polydipsia and polyuria.  Genitourinary: Negative for dysuria, frequency, difficulty urinating and pelvic pain.  Musculoskeletal: Negative for arthralgias, back pain, neck pain and neck stiffness.  Skin: Negative for color change and wound.  Allergic/Immunologic: Negative for immunocompromised state.  Neurological: Positive for headaches. Negative for facial asymmetry, weakness and numbness.  Hematological: Does not bruise/bleed easily.  Psychiatric/Behavioral: Negative for confusion and agitation.      Allergies  Lamictal; Morphine and related; Cymbalta; and Sertraline hcl  Home Medications   Prior to Admission medications   Medication Sig Start Date End Date Taking? Authorizing Provider  albuterol (PROVENTIL HFA;VENTOLIN HFA) 108 (90 BASE) MCG/ACT inhaler Inhale 2 puffs into the lungs every 6 (six) hours as needed for wheezing or shortness of breath.   Yes Historical Provider, MD  ALPRAZolam Duanne Moron) 0.5 MG tablet Take 0.25 mg by mouth at bedtime as needed for anxiety or sleep.   Yes Historical Provider, MD  amoxicillin (AMOXIL) 500 MG capsule Take 500-1,000 mg by mouth every 6 (six) hours. Take 2 tablets for the first dose. Then take one tablet every 6 hours until finished. 12/23/13 12/30/13 Yes Historical Provider, MD  buPROPion (WELLBUTRIN XL) 300 MG 24 hr tablet Take 300 mg by mouth every morning.   Yes Historical Provider, MD  furosemide (LASIX) 40 MG tablet Take 40 mg by mouth daily as needed for  fluid or edema.   Yes Historical Provider, MD  HYDROcodone-acetaminophen (NORCO) 10-325 MG per tablet Take 0.5 tablets by mouth every 6 (six) hours as needed for moderate pain or severe pain.    Yes Historical Provider, MD  hyoscyamine (LEVSIN, ANASPAZ) 0.125 MG tablet Take 0.125 mg by mouth every 4 (four) hours as needed for cramping.   Yes Historical Provider, MD  levothyroxine (SYNTHROID, LEVOTHROID) 75 MCG tablet Take 75 mcg by mouth daily before breakfast.   Yes Historical Provider, MD  traZODone (DESYREL) 50 MG tablet Take 25 mg by mouth at bedtime.   Yes Historical Provider, MD  traMADol (ULTRAM) 50 MG tablet Take 1 tablet (50 mg total) by mouth every 6 (six) hours as needed. 12/24/13  Neta Ehlers, MD   BP 123/60  Pulse 73  Temp(Src) 98.6 F (37 C) (Oral)  Resp 18  SpO2 100% Physical Exam  Constitutional: She is oriented to person, place, and time. She appears well-developed and well-nourished. No distress.  HENT:  Head: Normocephalic and atraumatic.    Mouth/Throat: No oropharyngeal exudate.    Eyes: Pupils are equal, round, and reactive to light.  Neck: Normal range of motion. Neck supple.  Cardiovascular: Normal rate, regular rhythm and normal heart sounds.  Exam reveals no gallop and no friction rub.   No murmur heard. Pulmonary/Chest: Effort normal and breath sounds normal. No respiratory distress. She has no wheezes. She has no rales.  Abdominal: Soft. Bowel sounds are normal. She exhibits no distension and no mass. There is no tenderness. There is no rebound and no guarding.  Musculoskeletal: Normal range of motion. She exhibits no edema and no tenderness.  Neurological: She is alert and oriented to person, place, and time.  Skin: Skin is warm and dry.  Psychiatric: She has a normal mood and affect.    ED Course  Procedures (including critical care time) Labs Review Labs Reviewed  CBC WITH DIFFERENTIAL - Abnormal; Notable for the following:    RBC 3.85 (*)      All other components within normal limits  BASIC METABOLIC PANEL - Abnormal; Notable for the following:    Sodium 136 (*)    GFR calc non Af Amer 79 (*)    All other components within normal limits    Imaging Review Ct Head W Contrast  12/24/2013   CLINICAL DATA:  Left-sided facial pain and swelling. Fever. Recent tooth extraction number 15  EXAM: CT HEAD WITH CONTRAST  CT MAXILLOFACIAL WITH CONTRAST  TECHNIQUE: Multidetector CT imaging of the head and maxillofacial structures were performed using the standard protocol without intravenous contrast. Multiplanar CT image reconstructions of the maxillofacial structures were also generated.  COMPARISON:  None.  CONTRAST:  100 cc Omnipaque 300  FINDINGS: CT HEAD FINDINGS  The brain has a normal appearance without evidence of atrophy, infarction, mass lesion, hemorrhage, hydrocephalus or extra-axial collection. The calvarium is unremarkable. The paranasal sinuses, middle ears and mastoids are clear. After contrast administration, no abnormal enhancement occurs.  CT MAXILLOFACIAL FINDINGS  By history, there has been recent extraction of tooth 15. Lucency is present around the roots of tooth 13. This is probably responsible for multiple retention cysts along the floor of the left maxillary sinus. There is no evidence of soft tissue facial abscess. No deep space collection. No other gross dental disease evident.  IMPRESSION: Head CT:  No abnormality  Face CT: Recent extraction tooth 15. Periapical lucency tooth 13 with associated reactive retention cysts along the floor of left maxillary sinus. No significant facial inflammatory process evident at CT otherwise. No evidence of drainable abscess or deep space inflammation.   Electronically Signed   By: Nelson Chimes M.D.   On: 12/24/2013 20:45   Ct Maxillofacial W/cm  12/24/2013   CLINICAL DATA:  Left-sided facial pain and swelling. Fever. Recent tooth extraction number 15  EXAM: CT HEAD WITH CONTRAST  CT  MAXILLOFACIAL WITH CONTRAST  TECHNIQUE: Multidetector CT imaging of the head and maxillofacial structures were performed using the standard protocol without intravenous contrast. Multiplanar CT image reconstructions of the maxillofacial structures were also generated.  COMPARISON:  None.  CONTRAST:  100 cc Omnipaque 300  FINDINGS: CT HEAD FINDINGS  The brain has a normal appearance without evidence  of atrophy, infarction, mass lesion, hemorrhage, hydrocephalus or extra-axial collection. The calvarium is unremarkable. The paranasal sinuses, middle ears and mastoids are clear. After contrast administration, no abnormal enhancement occurs.  CT MAXILLOFACIAL FINDINGS  By history, there has been recent extraction of tooth 15. Lucency is present around the roots of tooth 13. This is probably responsible for multiple retention cysts along the floor of the left maxillary sinus. There is no evidence of soft tissue facial abscess. No deep space collection. No other gross dental disease evident.  IMPRESSION: Head CT:  No abnormality  Face CT: Recent extraction tooth 15. Periapical lucency tooth 13 with associated reactive retention cysts along the floor of left maxillary sinus. No significant facial inflammatory process evident at CT otherwise. No evidence of drainable abscess or deep space inflammation.   Electronically Signed   By: Nelson Chimes M.D.   On: 12/24/2013 20:45     EKG Interpretation None      MDM   Final diagnoses:  Post-op pain  Dental implant pain, initial encounter    Pt is a 58 y.o. female with Pmhx as above who presents with L sided h/a, facial pain/swelling, low grade temp 8 days after root canal on tooth 15. She was placed on Amox by dental surgeon on call yesterday, but pain has continued. On PE, VSS, pt in NAD. She has minimal edema of L side of face w/o erythema. She has gum edema around tooth 15, not drainage or fluctuance.  Posterior oropharynx clear. WBC nml.  CT head & face with no  significant facial inflammatory process evident. No evidence of drainable abscess or deep space inflammation.  I believe symptoms are post-op pain/swelling. Will give Rx for trial of Tramadol and will rec alternating w/ scheduled tylenol, not to be used w/ percocet or norco.  Return precautions given for new or worsening symptoms including worsening pain, swelling, redness fever.             Neta Ehlers, MD 12/24/13 2222

## 2013-12-24 NOTE — ED Notes (Signed)
Pt is one week post root canal. Pt reports severe pain and pressure in l/side of mouth extending to l/eye. Reports swelling and white spots on roof of mouth. Pt was placed on antibiotic 24 hrs ago for possible strep. Advised to come to ED by PCP for CT

## 2013-12-24 NOTE — Telephone Encounter (Signed)
Had root canal last Monday, patient is on amoxicillin 500mg  every 6 hrs(was just started yesterday), has swelling in gum and roof of mouth. Pain and burning in eye left (same side as root canal now. Endodontist told patient to call us. Request call asap, very concerned.

## 2014-01-10 NOTE — Telephone Encounter (Signed)
Close encounter 

## 2014-01-14 ENCOUNTER — Ambulatory Visit
Admission: RE | Admit: 2014-01-14 | Discharge: 2014-01-14 | Disposition: A | Discharge status: Home / Self Care | Payer: Medicare Other | Source: Ambulatory Visit

## 2014-01-14 DIAGNOSIS — Z1231 Encounter for screening mammogram for malignant neoplasm of breast: Secondary | ICD-10-CM

## 2014-01-25 LAB — RENAL FUNCTION PANEL
Albumin: 4.1 g/dL (ref 3.5–5.2)
BUN: 21 mg/dL (ref 6–23)
CO2: 28 mEq/L (ref 19–32)
Calcium: 8.9 mg/dL (ref 8.4–10.5)
Chloride: 106 mEq/L (ref 96–112)
Creat: 0.87 mg/dL (ref 0.50–1.10)
Glucose, Bld: 81 mg/dL (ref 70–99)
Phosphorus: 3.1 mg/dL (ref 2.3–4.6)
Potassium: 4.5 mEq/L (ref 3.5–5.3)
Sodium: 142 mEq/L (ref 135–145)

## 2014-01-25 LAB — CBC
HCT: 40.3 % (ref 36.0–46.0)
Hemoglobin: 13.9 g/dL (ref 12.0–15.0)
MCH: 31.7 pg (ref 26.0–34.0)
MCHC: 34.5 g/dL (ref 30.0–36.0)
MCV: 91.8 fL (ref 78.0–100.0)
Platelets: 235 10*3/uL (ref 150–400)
RBC: 4.39 MIL/uL (ref 3.87–5.11)
RDW: 13.3 % (ref 11.5–15.5)
WBC: 5.2 10*3/uL (ref 4.0–10.5)

## 2014-01-25 LAB — LIPID PANEL
Cholesterol: 170 mg/dL (ref 0–200)
HDL: 50 mg/dL (ref >39)
LDL Cholesterol: 110 mg/dL — ABNORMAL HIGH (ref 0–99)
Total CHOL/HDL Ratio: 3.4 Ratio
Triglycerides: 48 mg/dL (ref <150)
VLDL: 10 mg/dL (ref 0–40)

## 2014-01-25 LAB — HEPATIC FUNCTION PANEL
ALT: 22 U/L (ref 0–35)
AST: 26 U/L (ref 0–37)
Albumin: 4.1 g/dL (ref 3.5–5.2)
Alkaline Phosphatase: 87 U/L (ref 39–117)
Bilirubin, Direct: 0.1 mg/dL (ref 0.0–0.3)
Indirect Bilirubin: 0.4 mg/dL (ref 0.2–1.2)
Total Bilirubin: 0.5 mg/dL (ref 0.2–1.2)
Total Protein: 6.4 g/dL (ref 6.0–8.3)

## 2014-01-25 LAB — TSH: TSH: 0.654 u[IU]/mL (ref 0.350–4.500)

## 2014-01-26 ENCOUNTER — Other Ambulatory Visit: Payer: Self-pay | Admitting: Family Medicine

## 2014-01-28 ENCOUNTER — Other Ambulatory Visit (HOSPITAL_BASED_OUTPATIENT_CLINIC_OR_DEPARTMENT_OTHER): Payer: Medicare Other

## 2014-01-28 ENCOUNTER — Encounter: Payer: Self-pay | Admitting: Oncology

## 2014-01-28 ENCOUNTER — Ambulatory Visit (HOSPITAL_BASED_OUTPATIENT_CLINIC_OR_DEPARTMENT_OTHER): Payer: Medicare Other

## 2014-01-28 ENCOUNTER — Ambulatory Visit (HOSPITAL_BASED_OUTPATIENT_CLINIC_OR_DEPARTMENT_OTHER): Payer: Medicare Other | Admitting: Oncology

## 2014-01-28 VITALS — BP 122/75 | HR 96 | Temp 98.6°F | Resp 18 | Ht 65.0 in | Wt 148.4 lb

## 2014-01-28 DIAGNOSIS — Z853 Personal history of malignant neoplasm of breast: Secondary | ICD-10-CM

## 2014-01-28 DIAGNOSIS — R5381 Other malaise: Secondary | ICD-10-CM

## 2014-01-28 DIAGNOSIS — F341 Dysthymic disorder: Secondary | ICD-10-CM

## 2014-01-28 DIAGNOSIS — M899 Disorder of bone, unspecified: Secondary | ICD-10-CM

## 2014-01-28 DIAGNOSIS — C50911 Malignant neoplasm of unspecified site of right female breast: Secondary | ICD-10-CM

## 2014-01-28 DIAGNOSIS — R5383 Other fatigue: Secondary | ICD-10-CM

## 2014-01-28 DIAGNOSIS — G4700 Insomnia, unspecified: Secondary | ICD-10-CM

## 2014-01-28 DIAGNOSIS — M949 Disorder of cartilage, unspecified: Secondary | ICD-10-CM

## 2014-01-28 LAB — COMPREHENSIVE METABOLIC PANEL (CC13)
ALT: 24 U/L (ref 0–55)
AST: 33 U/L (ref 5–34)
Albumin: 3.9 g/dL (ref 3.5–5.0)
Alkaline Phosphatase: 98 U/L (ref 40–150)
Anion Gap: 8 mEq/L (ref 3–11)
BUN: 21.3 mg/dL (ref 7.0–26.0)
CO2: 28 mEq/L (ref 22–29)
Calcium: 9.2 mg/dL (ref 8.4–10.4)
Chloride: 106 mEq/L (ref 98–109)
Creatinine: 0.9 mg/dL (ref 0.6–1.1)
Glucose: 92 mg/dl (ref 70–140)
Potassium: 3.9 mEq/L (ref 3.5–5.1)
Sodium: 141 mEq/L (ref 136–145)
Total Bilirubin: 0.31 mg/dL (ref 0.20–1.20)
Total Protein: 7.3 g/dL (ref 6.4–8.3)

## 2014-01-28 LAB — CBC WITH DIFFERENTIAL/PLATELET
BASO%: 1.3 % (ref 0.0–2.0)
Basophils Absolute: 0.1 10*3/uL (ref 0.0–0.1)
EOS%: 2 % (ref 0.0–7.0)
Eosinophils Absolute: 0.1 10*3/uL (ref 0.0–0.5)
HCT: 45.8 % (ref 34.8–46.6)
HGB: 14.7 g/dL (ref 11.6–15.9)
LYMPH%: 33.3 % (ref 14.0–49.7)
MCH: 31.6 pg (ref 25.1–34.0)
MCHC: 32.2 g/dL (ref 31.5–36.0)
MCV: 98.2 fL (ref 79.5–101.0)
MONO#: 0.6 10*3/uL (ref 0.1–0.9)
MONO%: 7.8 % (ref 0.0–14.0)
NEUT#: 4 10*3/uL (ref 1.5–6.5)
NEUT%: 55.6 % (ref 38.4–76.8)
Platelets: 248 10*3/uL (ref 145–400)
RBC: 4.67 10*6/uL (ref 3.70–5.45)
RDW: 13.2 % (ref 11.2–14.5)
WBC: 7.1 10*3/uL (ref 3.9–10.3)
lymph#: 2.4 10*3/uL (ref 0.9–3.3)

## 2014-01-28 NOTE — Progress Notes (Signed)
Checked in new patient with no financial issues prior to seeing the dr. No one on ROI as of today.

## 2014-01-28 NOTE — Progress Notes (Signed)
[name]  [phone] [phone]) [phone]     ID: [name] DOB: 1955/10/26  MR#: [id number]  POE#:[id number]  Patient Care Team: [name], MD as PCP - General (Obstetrics and Gynecology) Chauncey Cruel, MD as Consulting Physician (Oncology) [name], MD as Consulting Physician (General Surgery) Annia Belt, MD as Referring Physician (Internal Medicine) Mackie Pai, MD as Referring Physician (Cardiology)   CHIEF COMPLAINT: stage III breast cancer s/p remote autologous transplant  CURRENT TREATMENT: observation   BREAST CANCER HISTORY: From Dr.Gwyn Long's 03/16/2004 summary:  "[name] was well until 01/27/93. At that time she noted an enlarged right axillary breast mass. On 02/03/93 the patient underwent right axillary node dissection after mammogram revealed no evidence of primary tumor. Six of seventeen lymph nodes were positive with the largest node measuring 3 cm in widest dimension. Histology revealed metastatic poorly differentiated adenocarcinoma of the six nodes. Flow cytometry revealed two populations of cells, one diploid and one aneuploid. Estrogen receptors were positive and progesterone receptors were negative. Staging evaluation at that time was negative. She was referred to the [facility] where on review it was felt she had more than ten involved lymph nodes and therefore she was enrolled on CALGB protocol 9082.  She received four cycles of CAF chemotherapy which she tolerated reasonably well. She was randomized to the high dose arm and received high dose Cisplatin, Cytoxan and BCNU in January 1995. Her immediate transplant course we complicated by diarrhea and external hemorrhoids. She was discharged on Day 14.  At her six week evaluation in February 1995 her DLCO was 46% and she complained of mild dyspnea on exertion. She required a protracted course of  Prednisone due to increasing shortness of breath at lower doses. She was able to discontinue the Prednisone by early June 1995. Chest wall radiotherapy was completed by 9/95, and she was placed on Tamoxifen according to protocol."  Her subsequent history is as detailed below  INTERVAL HISTORY: The most recent breast related complaints dates back to July of 2014 when [name] noted a bloody discharge from her left breast. She underwent bilateral mammography and left breast ultrasonography at the breast Center 01/11/2013. Mammogram was negative as was physical exam. There was some tenderness to palpation in the 3:00 position of the left breast, butno nipple discharge was elicited. Ultrasound showed multiple dilated subareolar ducts in the left breast with no intraductal masses. There was a 7 mm cluster of microcysts in the 3:00 position which was the tender area noted.  On 02/22/2013 the patient underwent left central duct excision. The pathology [id number]) showed some dilated lactiferous ducts but no evidence of malignancy.  [name] is establishing herself in my service today.  REVIEW OF SYSTEMS: She complains of insomnia, and this makes her feel fatigued. She has some sinus problems and some remaining issues with her root canal which was infected. She has some mouth sores at times. She complains of palpitations intermittently. This is not a new symptom. She can be short of breath particularly when walking up stairs. She sleeps on 2 pillows. She has back pain and joint pain but these are not more intense or persistent than before. She has some numbness in her right foot. She admits to anxiety and depression, and there have been some deaths in the family (her in-laws, home she was caring for). A detailed review of systems was otherwise noncontributory  PAST MEDICAL HISTORY: Past Medical History  Diagnosis Date  . Allergic rhinitis   . Tremor   . Neuropathy   . Hypothyroidism   . History of migraine  headaches     only one in the past  . Nonischemic cardiomyopathy     EF 50% 2014 at [facility]  . Congenital anomaly of superior vena cava     R Ht cath 7/12:  NL pressures, no pulmonary hypertension and normal wedge pressure; probable congenital anomaly with at least a left sided SVC going into the coronary sinus.  Right-sided sats were somewhat elevated, although they seem to be elevated uniformly from the PA, RA and RV.  consider bubble study from left arm or cardiac MRI or CT  . H/O hiatal hernia     found during endoscopy in 2012  . Arthritis     hip, knees, feet, ankles  . Depression with anxiety 12/28/2010  . CAD (coronary artery disease)     Nonobstructive by cath 7/12:  50% proximal LAD  . Postmenopausal bleeding 08/23/2012  . Vaginitis and vulvovaginitis 10/21/2012  . Left knee pain 02/11/2013  . Pneumonia unknown    hx  . TMJ (dislocation of temporomandibular joint) 03/20/2013  . Thyromegaly 03/21/2013  . Atypical chest pain 09/26/2013  . Breast cancer     PAST SURGICAL HISTORY: Past Surgical History  Procedure Laterality Date  . Bone marrow transplant  01/95  . Cervical conization w/bx    . Arthoscopy      rt. knee  . Axillary node dissection Right 94  . Port-a-cath removed    . Hickman placed    . Biopsy breast      L & R  . Combined hysteroscopy diagnostic / d&c    . Hysteroscopy w/d&c N/A 08/23/2012    Procedure: DILATATION AND CURETTAGE /HYSTEROSCOPY;  Surgeon: [name], MD;  Location: [facility];  Service: Gynecology;  Laterality: N/A;  Removal of expelled essure coil  . Breast biopsy Left 02/20/2013    Procedure: LEFT BREAST CENTRAL DUCT EXCISION;  Surgeon: [name], MD;  Location: [facility];  Service: General;  Laterality: Left;  . Bone marrow harvest    . Mastectomy, partial  1994    right    FAMILY HISTORY Family History  Problem Relation Age of Onset  . COPD Mother   . Breast cancer Maternal Grandmother   . Tuberculosis Maternal Grandmother   .  Stroke Maternal Grandmother   . Allergies Father   . Heart disease Father   . Stroke Father   . Heart attack Father   . Allergies Sister   . Deep vein thrombosis Sister    the patient's father died from a stroke at the age of 9. The patient's mother died at age 47 from COPD. The patient had one brother, 2 sisters. There is no history of breast or ovarian cancer in the family.  GYNECOLOGIC HISTORY:  No LMP recorded. Patient is postmenopausal. Menarche age 43, first live birth age 73. The patient stopped having periods approximately 2010. She did not take hormone replacement. She did take birth control pills for approximately 20 years at remotely, with no complications.  SOCIAL HISTORY:  The patient is currently a homemaker, previously she was an Control and instrumentation engineer. She is married, and her husband Shanon Brow is a physical therapist. This is the patient's third marriage. HER-2 children are Zebulon Marissa Nestle ("Zeb") lives in [place] and works as a Geophysicist/field seismologist for [employer], and [employer], lives in [place], and is a Architectural technologist  and foreman. The patient has 2 grandchildren.    ADVANCED DIRECTIVES: In place; her husband is healthcare power of attorney   HEALTH MAINTENANCE: History  Substance Use Topics  . Smoking status: Never Smoker   . Smokeless tobacco: Never Used  . Alcohol Use: Yes     Comment: occasionally      Colonoscopy: Followed by Earle Gell  PAP: 2014  Bone density: At her gynecologist; showed osteopenia  Lipid panel:  Allergies  Allergen Reactions  . Lamictal [Lamotrigine] Rash    Steven's Johnson Syndrome  . Morphine And Related Anaphylaxis  . Cymbalta [Duloxetine Hcl] Nausea Only    Personality changes  . Sertraline Hcl Itching    "feel weird"    Current Outpatient Prescriptions  Medication Sig Dispense Refill  . ALPRAZolam (XANAX) 0.5 MG tablet Take 0.25 mg by mouth at bedtime as needed for anxiety or sleep.      . furosemide (LASIX) 40 MG  tablet Take 40 mg by mouth daily as needed for fluid or edema.      Marland Kitchen HYDROcodone-acetaminophen (NORCO) 10-325 MG per tablet Take 0.5 tablets by mouth every 6 (six) hours as needed for moderate pain or severe pain.       . hyoscyamine (LEVSIN, ANASPAZ) 0.125 MG tablet Take 0.125 mg by mouth every 4 (four) hours as needed for cramping.      . ondansetron (ZOFRAN) 8 MG tablet       . buPROPion (WELLBUTRIN XL) 150 MG 24 hr tablet Take 1 tablet (150 mg total) by mouth daily.  30 tablet  2  . escitalopram (LEXAPRO) 10 MG tablet Take 1 tablet (10 mg total) by mouth daily.  30 tablet  2  . levothyroxine (SYNTHROID, LEVOTHROID) 75 MCG tablet take 1 tablet by mouth once daily  30 tablet  3   No current facility-administered medications for this visit.    OBJECTIVE: Middle-aged white woman who appears stated age 37 Vitals:   01/28/14 1630  BP: 122/75  Pulse: 96  Temp: 98.6 F (37 C)  Resp: 18     Body mass index is 24.7 kg/(m^2).    ECOG FS:1 - Symptomatic but completely ambulatory  Ocular: Sclerae unicteric, pupils equal, round and reactive to light Ear-nose-throat: Oropharynx clear, no oral lesions noted Lymphatic: No cervical or supraclavicular adenopathy Lungs no rales or rhonchi, good excursion bilaterally Heart regular rate and rhythm, no murmur appreciated Abd soft, nontender, positive bowel sounds MSK no focal spinal tenderness, no upper extremity lymphedema Neuro: non-focal, well-oriented, moderately depressed affect Breasts: The right breast is status post biopsy and radiation. There is no evidence of local recurrence. The left breast is status post central duct excision. There are no suspicious findings otherwise. Both axillae are benign   LAB RESULTS:  CMP     Component Value Date/Time   NA 141 01/28/2014 1614   NA 142 01/25/2014 1036   K 3.9 01/28/2014 1614   K 4.5 01/25/2014 1036   CL 106 01/25/2014 1036   CO2 28 01/28/2014 1614   CO2 28 01/25/2014 1036   GLUCOSE 92 01/28/2014  1614   GLUCOSE 81 01/25/2014 1036   BUN 21.3 01/28/2014 1614   BUN 21 01/25/2014 1036   CREATININE 0.9 01/28/2014 1614   CREATININE 0.87 01/25/2014 1036   CREATININE 0.82 12/24/2013 1909   CALCIUM 9.2 01/28/2014 1614   CALCIUM 8.9 01/25/2014 1036   PROT 7.3 01/28/2014 1614   PROT 6.4 01/25/2014 1036   ALBUMIN 3.9 01/28/2014 1614  ALBUMIN 4.1 01/25/2014 1036   ALBUMIN 4.1 01/25/2014 1036   AST 33 01/28/2014 1614   AST 26 01/25/2014 1036   ALT 24 01/28/2014 1614   ALT 22 01/25/2014 1036   ALKPHOS 98 01/28/2014 1614   ALKPHOS 87 01/25/2014 1036   BILITOT 0.31 01/28/2014 1614   BILITOT 0.5 01/25/2014 1036   GFRNONAA 79* 12/24/2013 1909   GFRAA >90 12/24/2013 1909    I No results found for this basename: SPEP,  UPEP,   kappa and lambda light chains    Lab Results  Component Value Date   WBC 7.1 01/28/2014   NEUTROABS 4.0 01/28/2014   HGB 14.7 01/28/2014   HCT 45.8 01/28/2014   MCV 98.2 01/28/2014   PLT 248 01/28/2014      Chemistry      Component Value Date/Time   NA 141 01/28/2014 1614   NA 142 01/25/2014 1036   K 3.9 01/28/2014 1614   K 4.5 01/25/2014 1036   CL 106 01/25/2014 1036   CO2 28 01/28/2014 1614   CO2 28 01/25/2014 1036   BUN 21.3 01/28/2014 1614   BUN 21 01/25/2014 1036   CREATININE 0.9 01/28/2014 1614   CREATININE 0.87 01/25/2014 1036   CREATININE 0.82 12/24/2013 1909      Component Value Date/Time   CALCIUM 9.2 01/28/2014 1614   CALCIUM 8.9 01/25/2014 1036   ALKPHOS 98 01/28/2014 1614   ALKPHOS 87 01/25/2014 1036   AST 33 01/28/2014 1614   AST 26 01/25/2014 1036   ALT 24 01/28/2014 1614   ALT 22 01/25/2014 1036   BILITOT 0.31 01/28/2014 1614   BILITOT 0.5 01/25/2014 1036       Lab Results  Component Value Date   LABCA2 28 05/08/2012    No components found with this basename: LABCA125    No results found for this basename: INR,  in the last 168 hours  Urinalysis    Component Value Date/Time   COLORURINE YELLOW 10/15/2013 1350   APPEARANCEUR CLEAR 10/15/2013 1350   LABSPEC >1.046* 10/15/2013 1350    PHURINE 6.0 10/15/2013 1350   GLUCOSEU NEGATIVE 10/15/2013 1350   HGBUR NEGATIVE 10/15/2013 1350   BILIRUBINUR NEGATIVE 10/15/2013 1350   KETONESUR 15* 10/15/2013 1350   PROTEINUR NEGATIVE 10/15/2013 1350   UROBILINOGEN 0.2 10/15/2013 1350   NITRITE NEGATIVE 10/15/2013 1350   LEUKOCYTESUR NEGATIVE 10/15/2013 1350    STUDIES: Mm Screening Breast Tomo Bilateral  01/14/2014   CLINICAL DATA:  Screening.  EXAM: DIGITAL SCREENING BILATERAL MAMMOGRAM WITH 3D TOMO WITH CAD  COMPARISON:  Previous exam(s).  ACR Breast Density Category b: There are scattered areas of fibroglandular density.  FINDINGS: There are no findings suspicious for malignancy. Images were processed with CAD.  IMPRESSION: No mammographic evidence of malignancy. A result letter of this screening mammogram will be mailed directly to the patient.  RECOMMENDATION: Screening mammogram in one year. (Code:SM-B-01Y)  BI-RADS CATEGORY  1: Negative.   Electronically Signed   By: Abelardo Diesel M.D.   On: 01/14/2014 16:02   Multiple other recent studies were reviewed, including chest x-ray from 02/16/2013 and subsequent dates, thyroid ultrasound from 03/20/2013, CT angiogram 10/15/2013 and left rib films from 12/22/2013, none of which showed any evidence of recurrent or metastatic breast cancer  ASSESSMENT: 58 y.o. [place] woman status post high-dose chemotherapy with hematopoietic support January 1995 for a stage III (greater than 10 nodes positive) breast cancer as per CALGB protocol 9082, followed by tamoxifen for 5 years, completed February of 2000  PLAN: We went over her situation in detail, and she understands while one can never say breast cancer is "cured", she has been in remission 20 years in her risk of recurrence is very low.  She received 5 years of tamoxifen, which we now know is inadequate. Today we would have recommended an additional 5 years of tamoxifen or an additional 5 years of an aromatase inhibitor. The latter of course would be  relatively contraindicated because of a history of osteopenia  The benefit of an additional 5 years of tamoxifen, if any, would be small (in the relevant studies there was a further decrease in the risk of recurrence of about 3%, but of course her situation is "out-of-the-box"). On the other hand tamoxifen can be helpful in terms of her osteopenia. In that setting it works about as well as raloxifene, which is frequently used for that indication. Of course the patient is aware of the possible toxicities, side effects and complications of tamoxifen, including increasing risk of blood clots and risk of cancer of the uterus. [place] will consider this option.  From my point of view, I am comfortable releasing her from routine followup. She understands we now have an updated database on her, which will remain accessible for the next 10 years. I will be glad to serve as her "go to oncologist" as needed. As far as breast cancer followup is concerned, all she will need is a yearly physician breast exam and yearly mammography, preferably with tomography.  The patient has a good understanding of the overall plan. She agrees with it. She will schedule further appointments with me on an as-needed basis. I am copying her primary care physician as well as her transplant doctors at [facility] with this update. Chauncey Cruel, MD   02/10/2014 8:41 AM

## 2014-02-01 ENCOUNTER — Ambulatory Visit (INDEPENDENT_AMBULATORY_CARE_PROVIDER_SITE_OTHER): Payer: Medicare Other | Admitting: Family Medicine

## 2014-02-01 ENCOUNTER — Encounter: Payer: Self-pay | Admitting: Family Medicine

## 2014-02-01 VITALS — BP 120/80 | HR 106 | Temp 98.4°F | Ht 65.0 in | Wt 148.1 lb

## 2014-02-01 DIAGNOSIS — K5900 Constipation, unspecified: Secondary | ICD-10-CM

## 2014-02-01 DIAGNOSIS — F32A Depression, unspecified: Secondary | ICD-10-CM

## 2014-02-01 DIAGNOSIS — F418 Other specified anxiety disorders: Secondary | ICD-10-CM

## 2014-02-01 DIAGNOSIS — Z853 Personal history of malignant neoplasm of breast: Secondary | ICD-10-CM

## 2014-02-01 DIAGNOSIS — F411 Generalized anxiety disorder: Secondary | ICD-10-CM

## 2014-02-01 DIAGNOSIS — F341 Dysthymic disorder: Secondary | ICD-10-CM

## 2014-02-01 DIAGNOSIS — K219 Gastro-esophageal reflux disease without esophagitis: Secondary | ICD-10-CM

## 2014-02-01 DIAGNOSIS — E039 Hypothyroidism, unspecified: Secondary | ICD-10-CM

## 2014-02-01 DIAGNOSIS — F419 Anxiety disorder, unspecified: Principal | ICD-10-CM

## 2014-02-01 DIAGNOSIS — F329 Major depressive disorder, single episode, unspecified: Secondary | ICD-10-CM

## 2014-02-01 MED ORDER — BUPROPION HCL ER (XL) 150 MG PO TB24: 150.0000 mg | ORAL_TABLET | Freq: Every day | ORAL | Status: DC

## 2014-02-01 MED ORDER — ESCITALOPRAM OXALATE 10 MG PO TABS: 10.0000 mg | ORAL_TABLET | Freq: Every day | ORAL | Status: DC

## 2014-02-01 NOTE — Assessment & Plan Note (Signed)
Is trying to decide whether to restart her Tamoxifen at the recommendation of her local oncologist, she is going to run this by her [facility] oncology team

## 2014-02-01 NOTE — Patient Instructions (Signed)
Post-traumatic Stress °You have post-traumatic stress disorder (PTSD). This condition causes many different symptoms including: emotional outbursts, anxiety, sleeping problems, social withdrawal, and drug abuse. PTSD often follows a particularly traumatic event such as war, or natural disasters like hurricanes, earthquakes, or floods. It can also be seen after personal traumas such as accidents, rape, or the death of someone you love. Symptoms may be delayed for days or even years. Emotional numbing and the inability to feel your emotions, may be the earliest sign. Periods of agitation, aggression, and inability to perform ordinary tasks are common with PTSD. °Nightmares and daytime memories of the trauma often bring on uncontrolled symptoms. Sufferers typically startle easily and avoid reminders of the trauma. Panic attacks, feelings of extreme guilt, and blackouts are often reported. Treatment is very helpful, especially group therapy. Healing happens when emotional traumas are shared with others who have a sympathetic ear. The [facility] have helped over 185,000 veterans with this problem. Medication is also very effective. The symptoms can become chronic and lifelong, so it is important to get help. Call your caregiver or a counselor who deals with this type of problem for further assistance. °Document Released: 07/22/2004 Document Revised: 09/06/2011 Document Reviewed: 06/14/2005 °ExitCare® Patient Information ©2015 [employer]. This information is not intended to replace advice given to you by your health care provider. Make sure you discuss any questions you have with your health care provider. ° °

## 2014-02-01 NOTE — Assessment & Plan Note (Signed)
On Levothyroxine, continue to monitor 

## 2014-02-01 NOTE — Assessment & Plan Note (Signed)
Avoid offending foods, start probiotics. Do not eat large meals in late evening and consider raising head of bed.  

## 2014-02-01 NOTE — Assessment & Plan Note (Signed)
Struggling with increased stress secondary to a recent family member passing and her marriage struggling. Will try titrating down the Wellbutrin XL 150 mg and add Lexapro 10 mg daily

## 2014-02-01 NOTE — Progress Notes (Signed)
Pre visit review using our clinic review tool, if applicable. No additional management support is needed unless otherwise documented below in the visit note. 

## 2014-02-01 NOTE — Progress Notes (Signed)
Patient ID: [name], female   DOB: 09-14-1955, 58 y.o.   MRN: [id number] CARLEEN RHUE [id number] 1956-03-06 02/01/2014      Progress Note-Follow Up  Subjective  Chief Complaint  Chief Complaint  Patient presents with  . Follow-up    HPI  Patient is a 58 year old female in today for routine medical care. She is noting increasing stressors. She is still grieving the recent death of her former inlaws whom she helped to care fore. Her marriage is struggling and she spends many hours daily alone, is trying to find a new job without success so far. Struggles with anxiety with palpitations and sob at times as well as anhedonia but no suicidal ideation. No recent illness. Denies CP/palp/SOB/HA/congestion/fevers/ GU c/o. Taking meds as prescribed. Does note intermittent constipation, diarrhea and reflux  Past Medical History  Diagnosis Date  . Allergic rhinitis   . Tremor   . Neuropathy   . Hypothyroidism   . History of migraine headaches     only one in the past  . Nonischemic cardiomyopathy     EF 50% 2014 at [facility]  . Congenital anomaly of superior vena cava     R Ht cath 7/12:  NL pressures, no pulmonary hypertension and normal wedge pressure; probable congenital anomaly with at least a left sided SVC going into the coronary sinus.  Right-sided sats were somewhat elevated, although they seem to be elevated uniformly from the PA, RA and RV.  consider bubble study from left arm or cardiac MRI or CT  . H/O hiatal hernia     found during endoscopy in 2012  . Arthritis     hip, knees, feet, ankles  . Depression with anxiety 12/28/2010  . CAD (coronary artery disease)     Nonobstructive by cath 7/12:  50% proximal LAD  . Postmenopausal bleeding 08/23/2012  . Vaginitis and vulvovaginitis 10/21/2012  . Left knee pain 02/11/2013  . Pneumonia unknown    hx  . TMJ (dislocation of temporomandibular joint) 03/20/2013  . Thyromegaly 03/21/2013  . Atypical chest pain 09/26/2013  . Breast cancer      Past Surgical History  Procedure Laterality Date  . Bone marrow transplant  01/95  . Cervical conization w/bx    . Arthoscopy      rt. knee  . Axillary node dissection Right 94  . Port-a-cath removed    . Hickman placed    . Biopsy breast      L & R  . Combined hysteroscopy diagnostic / d&c    . Hysteroscopy w/d&c N/A 08/23/2012    Procedure: DILATATION AND CURETTAGE /HYSTEROSCOPY;  Surgeon: [name], MD;  Location: [facility];  Service: Gynecology;  Laterality: N/A;  Removal of expelled essure coil  . Breast biopsy Left 02/20/2013    Procedure: LEFT BREAST CENTRAL DUCT EXCISION;  Surgeon: [name], MD;  Location: [place];  Service: General;  Laterality: Left;  . Bone marrow harvest    . Mastectomy, partial  1994    right    Family History  Problem Relation Age of Onset  . COPD Mother   . Breast cancer Maternal Grandmother   . Tuberculosis Maternal Grandmother   . Stroke Maternal Grandmother   . Allergies Father   . Heart disease Father   . Stroke Father   . Heart attack Father   . Allergies Sister   . Deep vein thrombosis Sister     History   Social History  .  Marital Status: Married    Spouse Name: N/A    Number of Children: N/A  . Years of Education: N/A   Occupational History  . homemaker    Social History Main Topics  . Smoking status: Never Smoker   . Smokeless tobacco: Never Used  . Alcohol Use: Yes     Comment: occasionally   . Drug Use: No  . Sexual Activity: Not on file   Other Topics Concern  . Not on file   Social History Narrative   Lives in [place]   Has been married for 4 yerars.  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 ALPRAZolam (XANAX) 0.5 MG tablet Take 0.25 mg by mouth at bedtime as needed for anxiety or sleep.      Marland Kitchen buPROPion (WELLBUTRIN XL) 300 MG 24 hr tablet take 1 tablet by  mouth once daily  30 tablet  5  . furosemide (LASIX) 40 MG tablet Take 40 mg by mouth daily as needed for fluid or edema.      Marland Kitchen HYDROcodone-acetaminophen (NORCO) 10-325 MG per tablet Take 0.5 tablets by mouth every 6 (six) hours as needed for moderate pain or severe pain.       . hyoscyamine (LEVSIN, ANASPAZ) 0.125 MG tablet Take 0.125 mg by mouth every 4 (four) hours as needed for cramping.      Marland Kitchen levothyroxine (SYNTHROID, LEVOTHROID) 75 MCG tablet Take 75 mcg by mouth daily before breakfast.      . ondansetron (ZOFRAN) 8 MG tablet        No current facility-administered medications on file prior to visit.    Allergies  Allergen Reactions  . Lamictal [Lamotrigine] Rash    Steven's Johnson Syndrome  . Morphine And Related Anaphylaxis  . Cymbalta [Duloxetine Hcl] Nausea Only    Personality changes  . Sertraline Hcl Itching    "feel weird"    Review of Systems  Review of Systems  Constitutional: Negative for fever and malaise/fatigue.  HENT: Negative for congestion.   Eyes: Negative for discharge.  Respiratory: Negative for shortness of breath.   Cardiovascular: Negative for chest pain, palpitations and leg swelling.  Gastrointestinal: Negative for nausea, abdominal pain and diarrhea.  Genitourinary: Negative for dysuria.  Musculoskeletal: Negative for falls.  Skin: Negative for rash.  Neurological: Negative for loss of consciousness and headaches.  Endo/Heme/Allergies: Negative for polydipsia.  Psychiatric/Behavioral: Negative for depression and suicidal ideas. The patient is not nervous/anxious and does not have insomnia.     Objective  BP 120/80  Pulse 106  Temp(Src) 98.4 F (36.9 C) (Oral)  Ht 5\' 5"  (1.651 m)  Wt 148 lb 1.9 oz (67.187 kg)  BMI 24.65 kg/m2  SpO2 98%  Physical Exam  Physical Exam  Constitutional: She is oriented to person, place, and time and well-developed, well-nourished, and in no distress. No distress.  HENT:  Head: Normocephalic and  atraumatic.  Eyes: Conjunctivae are normal.  Neck: Neck supple. No thyromegaly present.  Cardiovascular: Normal rate, regular rhythm and normal heart sounds.   No murmur heard. Pulmonary/Chest: Effort normal and breath sounds normal. She has no wheezes.  Abdominal: She exhibits no distension and no mass.  Musculoskeletal: She exhibits no edema.  Lymphadenopathy:    She has no cervical adenopathy.  Neurological: She is alert and oriented to person, place, and time.  Skin: Skin is  warm and dry. No rash noted. She is not diaphoretic.  Psychiatric: Memory, affect and judgment normal.    Lab Results  Component Value Date   TSH 0.654 01/25/2014   Lab Results  Component Value Date   WBC 7.1 01/28/2014   HGB 14.7 01/28/2014   HCT 45.8 01/28/2014   MCV 98.2 01/28/2014   PLT 248 01/28/2014   Lab Results  Component Value Date   CREATININE 0.9 01/28/2014   BUN 21.3 01/28/2014   NA 141 01/28/2014   K 3.9 01/28/2014   CL 106 01/25/2014   CO2 28 01/28/2014   Lab Results  Component Value Date   ALT 24 01/28/2014   AST 33 01/28/2014   ALKPHOS 98 01/28/2014   BILITOT 0.31 01/28/2014   Lab Results  Component Value Date   CHOL 170 01/25/2014   Lab Results  Component Value Date   HDL 50 01/25/2014   Lab Results  Component Value Date   LDLCALC 110* 01/25/2014   Lab Results  Component Value Date   TRIG 48 01/25/2014   Lab Results  Component Value Date   CHOLHDL 3.4 01/25/2014     Assessment & Plan   Esophageal reflux Avoid offending foods, start probiotics. Do not eat large meals in late evening and consider raising head of bed.   Hypothyroidism On Levothyroxine, continue to monitor  Depression with anxiety Struggling with increased stress secondary to a recent family member passing and her marriage struggling. Will try titrating down the Wellbutrin XL 150 mg and add Lexapro 10 mg daily  History of breast cancer in female-Right Is trying to decide whether to restart her Tamoxifen at the  recommendation of her local oncologist, she is going to run this by her [facility] oncology team  BREAST CANCER, HX OF Doing well, following with oncology  ANXIETY Agrees to a trial of dropping Wellbutrin to 150 and adding lexapro due to her increasing stressors  Unspecified constipation Encouraged increased hydration and fiber in diet. Daily probiotics.

## 2014-02-03 NOTE — Assessment & Plan Note (Signed)
Agrees to a trial of dropping Wellbutrin to 150 and adding lexapro due to her increasing stressors

## 2014-02-03 NOTE — Assessment & Plan Note (Signed)
Doing well, following with oncology

## 2014-02-03 NOTE — Assessment & Plan Note (Signed)
Encouraged increased hydration and fiber in diet. Daily probiotics.  

## 2014-02-05 ENCOUNTER — Other Ambulatory Visit: Payer: Self-pay | Admitting: Family Medicine

## 2014-02-10 DIAGNOSIS — Z170 Estrogen receptor positive status [ER+]: Secondary | ICD-10-CM

## 2014-02-10 DIAGNOSIS — C50811 Malignant neoplasm of overlapping sites of right female breast: Secondary | ICD-10-CM | POA: Insufficient documentation

## 2014-02-22 ENCOUNTER — Encounter: Payer: Self-pay | Admitting: Physician Assistant

## 2014-02-22 ENCOUNTER — Ambulatory Visit (INDEPENDENT_AMBULATORY_CARE_PROVIDER_SITE_OTHER): Payer: Medicare Other | Admitting: Physician Assistant

## 2014-02-22 VITALS — BP 136/82 | HR 98 | Temp 98.3°F | Resp 18 | Ht 65.0 in | Wt 154.5 lb

## 2014-02-22 DIAGNOSIS — H9209 Otalgia, unspecified ear: Secondary | ICD-10-CM

## 2014-02-22 DIAGNOSIS — H9201 Otalgia, right ear: Secondary | ICD-10-CM

## 2014-02-22 DIAGNOSIS — L989 Disorder of the skin and subcutaneous tissue, unspecified: Secondary | ICD-10-CM

## 2014-02-22 NOTE — Progress Notes (Signed)
Subjective:    Patient ID: [name], female    DOB: 1956-06-13, 58 y.o.   MRN: [id number]  Otalgia  There is pain in the right ear. This is a new problem. The current episode started 1 to 4 weeks ago. The problem occurs constantly. The problem has been gradually worsening. There has been no fever. The pain is at a severity of 5/10. The pain is moderate. Pertinent negatives include no abdominal pain, coughing, diarrhea, drainage, ear discharge, headaches, hearing loss, neck pain, rash, rhinorrhea, sore throat or vomiting. Associated symptoms comments: Ear itching with the pain. Treatments tried: benadryl, claritin. The treatment provided no relief. There is no history of a chronic ear infection, hearing loss or a tympanostomy tube.    The patient also asks about an area on her right upper back that she has been scratching recently. She states this does not really itch, she just felt something there and scratched. She believes she scratched something off from this area.  Review of Systems  Constitutional: Negative for fever and chills.  HENT: Positive for ear pain. Negative for ear discharge, hearing loss, rhinorrhea and sore throat.   Respiratory: Negative for cough and shortness of breath.   Cardiovascular: Negative for chest pain.  Gastrointestinal: Negative for nausea, vomiting, abdominal pain and diarrhea.  Musculoskeletal: Negative for neck pain.  Skin: Negative for rash.  Neurological: Negative for syncope and headaches.  All other systems reviewed and are negative.    Past Medical History  Diagnosis Date  . Allergic rhinitis   . Tremor   . Neuropathy   . Hypothyroidism   . History of migraine headaches     only one in the past  . Nonischemic cardiomyopathy     EF 50% 2014 at [facility]  . Congenital anomaly of superior vena cava     R Ht cath 7/12:  NL pressures, no pulmonary hypertension and normal wedge pressure; probable congenital anomaly with at least a left sided SVC  going into the coronary sinus.  Right-sided sats were somewhat elevated, although they seem to be elevated uniformly from the PA, RA and RV.  consider bubble study from left arm or cardiac MRI or CT  . H/O hiatal hernia     found during endoscopy in 2012  . Arthritis     hip, knees, feet, ankles  . Depression with anxiety 12/28/2010  . CAD (coronary artery disease)     Nonobstructive by cath 7/12:  50% proximal LAD  . Postmenopausal bleeding 08/23/2012  . Vaginitis and vulvovaginitis 10/21/2012  . Left knee pain 02/11/2013  . Pneumonia unknown    hx  . TMJ (dislocation of temporomandibular joint) 03/20/2013  . Thyromegaly 03/21/2013  . Atypical chest pain 09/26/2013  . Breast cancer     History   Social History  . Marital Status: Married    Spouse Name: N/A    Number of Children: N/A  . Years of Education: N/A   Occupational History  . homemaker    Social History Main Topics  . Smoking status: Never Smoker   . Smokeless tobacco: Never Used  . Alcohol Use: Yes     Comment: occasionally   . Drug Use: No  . Sexual Activity: Not on file   Other Topics Concern  . Not on file   Social History Narrative   Lives in [place]   Has been married for 4 yerars.  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 Bone marrow transplant  01/95  . Cervical conization w/bx    . Arthoscopy      rt. knee  . Axillary node dissection Right 94  . Port-a-cath removed    . Hickman placed    . Biopsy breast      L & R  . Combined hysteroscopy diagnostic / d&c    . Hysteroscopy w/d&c N/A 08/23/2012    Procedure: DILATATION AND CURETTAGE /HYSTEROSCOPY;  Surgeon: [name], MD;  Location: [facility];  Service: Gynecology;  Laterality: N/A;  Removal of expelled essure coil  . Breast biopsy Left 02/20/2013    Procedure: LEFT BREAST CENTRAL DUCT EXCISION;  Surgeon: [name], MD;  Location: [place];   Service: General;  Laterality: Left;  . Bone marrow harvest    . Mastectomy, partial  1994    right    Family History  Problem Relation Age of Onset  . COPD Mother   . Breast cancer Maternal Grandmother   . Tuberculosis Maternal Grandmother   . Stroke Maternal Grandmother   . Allergies Father   . Heart disease Father   . Stroke Father   . Heart attack Father   . Allergies Sister   . Deep vein thrombosis Sister     Allergies  Allergen Reactions  . Lamictal [Lamotrigine] Rash    Steven's Johnson Syndrome  . Morphine And Related Anaphylaxis  . Cymbalta [Duloxetine Hcl] Nausea Only    Personality changes  . Sertraline Hcl Itching    "feel weird"    Current Outpatient Prescriptions on File Prior to Visit  Medication Sig Dispense Refill  . ALPRAZolam (XANAX) 0.5 MG tablet Take 0.25 mg by mouth at bedtime as needed for anxiety or sleep.      Marland Kitchen buPROPion (WELLBUTRIN XL) 150 MG 24 hr tablet Take 1 tablet (150 mg total) by mouth daily.  30 tablet  2  . furosemide (LASIX) 40 MG tablet Take 40 mg by mouth daily as needed for fluid or edema.      Marland Kitchen HYDROcodone-acetaminophen (NORCO) 10-325 MG per tablet Take 0.5 tablets by mouth every 6 (six) hours as needed for moderate pain or severe pain.       . hyoscyamine (LEVSIN, ANASPAZ) 0.125 MG tablet Take 0.125 mg by mouth every 4 (four) hours as needed for cramping.      Marland Kitchen levothyroxine (SYNTHROID, LEVOTHROID) 75 MCG tablet take 1 tablet by mouth once daily  30 tablet  3  . ondansetron (ZOFRAN) 8 MG tablet        No current facility-administered medications on file prior to visit.    EXAM: BP 136/82  Pulse 98  Temp(Src) 98.3 F (36.8 C) (Oral)  Resp 18  Ht 5\' 5"  (1.651 m)  Wt 154 lb 8 oz (70.081 kg)  BMI 25.71 kg/m2     Objective:   Physical Exam  Nursing note and vitals reviewed. Constitutional: She is oriented to person, place, and time. She appears well-developed and well-nourished. No distress.  HENT:  Head: Normocephalic  and atraumatic.  Right Ear: External ear normal.  Left Ear: External ear normal.  Nose: Nose normal.  Mouth/Throat: Oropharynx is clear and moist. No oropharyngeal exudate.  There is mild tragal tenderness palpation on the right side. Bilat TMs normal. No TMJ ttp. No visible lumps or swelling. The skin overlying the area of pain does not appear to be erythematous, no excessive  warmth, no swelling, no fluctuance.  Eyes: Conjunctivae and EOM are normal. Pupils are equal, round, and reactive to light.  Neck: Normal range of motion. Neck supple.  Cardiovascular: Normal rate, regular rhythm and intact distal pulses.   Pulmonary/Chest: Effort normal and breath sounds normal. No stridor. No respiratory distress.  Lymphadenopathy:    She has no cervical adenopathy.  Neurological: She is alert and oriented to person, place, and time.  Skin: Skin is warm and dry. She is not diaphoretic. No erythema.  There is a small lesion on the right upper back region overlying shoulder blade which is flaky in nature, not bleeding, mild central scabbing, possibly a scratched off seborrheic keratosis. There is no erythema, warmth, tenderness to palpation, or fluctuance.  Psychiatric: She has a normal mood and affect. Her behavior is normal. Judgment and thought content normal.     Lab Results  Component Value Date   WBC 7.1 01/28/2014   HGB 14.7 01/28/2014   HCT 45.8 01/28/2014   PLT 248 01/28/2014   GLUCOSE 92 01/28/2014   CHOL 170 01/25/2014   TRIG 48 01/25/2014   HDL 50 01/25/2014   LDLDIRECT 156.7 02/09/2011   LDLCALC 110* 01/25/2014   ALT 24 01/28/2014   AST 33 01/28/2014   NA 141 01/28/2014   K 3.9 01/28/2014   CL 106 01/25/2014   CREATININE 0.9 01/28/2014   BUN 21.3 01/28/2014   CO2 28 01/28/2014   TSH 0.654 01/25/2014   INR 1.0 01/19/2011        Assessment & Plan:  Amantha was seen today for otalgia.  Diagnoses and associated orders for this visit:  Skin lesion Comments: Possibly a seborrheic keratosis that was  scratched off. Watchful waiting. Try not to scratch the area.   Ear pain, right Comments: And itching, no other symptoms. Non-specific. Symptomatic care as needed, watchful waiting.    Return precautions provided, and patient handout on otalgia.  Plan to follow up as needed, or for worsening or persistent symptoms despite treatment.  Patient Instructions  Unfortunately, there is nothing specific that we can attribute your ear pain to. Continue to monitor this area. If any more symptoms develop or your current symptoms change, please return to the office.  You can trytaking tylenol to help the pain symptoms. You can also try alternating ice or heat over the area to help symptoms. Be sure not to sleep with a heating pad on the area to prevent getting burned.  For your skin complaint on your back, monitor the area for any return of symptoms or changes. If any growth appears, see your PCP to have it evaluated. Try not to scratch the area.  If emergency symptoms discussed during visit developed, seek medical attention immediately.  Followup as needed, or for worsening or persistent symptoms despite treatment.

## 2014-02-22 NOTE — Progress Notes (Signed)
Pre visit review using our clinic review tool, if applicable. No additional management support is needed unless otherwise documented below in the visit note. 

## 2014-02-22 NOTE — Patient Instructions (Addendum)
Unfortunately, there is nothing specific that we can attribute your ear pain to. Continue to monitor this area. If any more symptoms develop or your current symptoms change, please return to the office.  You can trytaking tylenol to help the pain symptoms. You can also try alternating ice or heat over the area to help symptoms. Be sure not to sleep with a heating pad on the area to prevent getting burned.  For your skin complaint on your back, monitor the area for any return of symptoms or changes. If any growth appears, see your PCP to have it evaluated. Try not to scratch the area.  If emergency symptoms discussed during visit developed, seek medical attention immediately.  Followup as needed, or for worsening or persistent symptoms despite treatment.    Otalgia The most common reason for this in children is an infection of the middle ear. Pain from the middle ear is usually caused by a build-up of fluid and pressure behind the eardrum. Pain from an earache can be sharp, dull, or burning. The pain may be temporary or constant. The middle ear is connected to the nasal passages by a short narrow tube called the Eustachian tube. The Eustachian tube allows fluid to drain out of the middle ear, and helps keep the pressure in your ear equalized. CAUSES  A cold or allergy can block the Eustachian tube with inflammation and the build-up of secretions. This is especially likely in small children, because their Eustachian tube is shorter and more horizontal. When the Eustachian tube closes, the normal flow of fluid from the middle ear is stopped. Fluid can accumulate and cause stuffiness, pain, hearing loss, and an ear infection if germs start growing in this area. SYMPTOMS  The symptoms of an ear infection may include fever, ear pain, fussiness, increased crying, and irritability. Many children will have temporary and minor hearing loss during and right after an ear infection. Permanent hearing loss is  rare, but the risk increases the more infections a child has. Other causes of ear pain include retained water in the outer ear canal from swimming and bathing. Ear pain in adults is less likely to be from an ear infection. Ear pain may be referred from other locations. Referred pain may be from the joint between your jaw and the skull. It may also come from a tooth problem or problems in the neck. Other causes of ear pain include:  A foreign body in the ear.  Outer ear infection.  Sinus infections.  Impacted ear wax.  Ear injury.  Arthritis of the jaw or TMJ problems.  Middle ear infection.  Tooth infections.  Sore throat with pain to the ears. DIAGNOSIS  Your caregiver can usually make the diagnosis by examining you. Sometimes other special studies, including x-rays and lab work may be necessary. TREATMENT   If antibiotics were prescribed, use them as directed and finish them even if you or your child's symptoms seem to be improved.  Sometimes PE tubes are needed in children. These are little plastic tubes which are put into the eardrum during a simple surgical procedure. They allow fluid to drain easier and allow the pressure in the middle ear to equalize. This helps relieve the ear pain caused by pressure changes. HOME CARE INSTRUCTIONS   Only take over-the-counter or prescription medicines for pain, discomfort, or fever as directed by your caregiver. DO NOT GIVE CHILDREN ASPIRIN because of the association of Reye's Syndrome in children taking aspirin.  Use a cold pack  applied to the outer ear for 15-20 minutes, 03-04 times per day or as needed may reduce pain. Do not apply ice directly to the skin. You may cause frost bite.  Over-the-counter ear drops used as directed may be effective. Your caregiver may sometimes prescribe ear drops.  Resting in an upright position may help reduce pressure in the middle ear and relieve pain.  Ear pain caused by rapidly descending from high  altitudes can be relieved by swallowing or chewing gum. Allowing infants to suck on a bottle during airplane travel can help.  Do not smoke in the house or near children. If you are unable to quit smoking, smoke outside.  Control allergies. SEEK IMMEDIATE MEDICAL CARE IF:   You or your child are becoming sicker.  Pain or fever relief is not obtained with medicine.  You or your child's symptoms (pain, fever, or irritability) do not improve within 24 to 48 hours or as instructed.  Severe pain suddenly stops hurting. This may indicate a ruptured eardrum.  You or your children develop new problems such as severe headaches, stiff neck, difficulty swallowing, or swelling of the face or around the ear. Document Released: 01/30/2004 Document Revised: 09/06/2011 Document Reviewed: 06/05/2008 [facility] Patient Information 2015 [place], [place]. This information is not intended to replace advice given to you by your health care provider. Make sure you discuss any questions you have with your health care provider.

## 2014-03-06 ENCOUNTER — Encounter: Payer: Self-pay | Admitting: Physician Assistant

## 2014-03-06 ENCOUNTER — Ambulatory Visit (INDEPENDENT_AMBULATORY_CARE_PROVIDER_SITE_OTHER): Payer: Medicare Other | Admitting: Physician Assistant

## 2014-03-06 VITALS — BP 108/70 | HR 87 | Temp 98.0°F | Resp 16 | Ht 65.0 in | Wt 153.5 lb

## 2014-03-06 DIAGNOSIS — H6240 Otitis externa in other diseases classified elsewhere, unspecified ear: Secondary | ICD-10-CM

## 2014-03-06 DIAGNOSIS — J329 Chronic sinusitis, unspecified: Secondary | ICD-10-CM | POA: Insufficient documentation

## 2014-03-06 DIAGNOSIS — A499 Bacterial infection, unspecified: Secondary | ICD-10-CM

## 2014-03-06 DIAGNOSIS — B369 Superficial mycosis, unspecified: Secondary | ICD-10-CM | POA: Insufficient documentation

## 2014-03-06 DIAGNOSIS — B49 Unspecified mycosis: Secondary | ICD-10-CM

## 2014-03-06 DIAGNOSIS — B9689 Other specified bacterial agents as the cause of diseases classified elsewhere: Secondary | ICD-10-CM

## 2014-03-06 MED ORDER — AMOXICILLIN 875 MG PO TABS: 875.0000 mg | ORAL_TABLET | Freq: Two times a day (BID) | ORAL | Status: DC

## 2014-03-06 MED ORDER — HYDROCORTISONE-ACETIC ACID 1-2 % OT SOLN: 3.0000 [drp] | Freq: Two times a day (BID) | OTIC | Status: DC

## 2014-03-06 NOTE — Assessment & Plan Note (Signed)
Rx Vosol-HC drops to apply to right ear x 1 week.

## 2014-03-06 NOTE — Progress Notes (Signed)
Pre visit review using our clinic review tool, if applicable. No additional management support is needed unless otherwise documented below in the visit note/SLS  

## 2014-03-06 NOTE — Progress Notes (Signed)
Patient presents to clinic today c/o sinus pressure, sinus pain, ear pain and fatigue associated with some chills over the past several weeks.  Denies fever or SOB, but endorses mild cough.  Endorses pruritus of ears bilaterally.  Was evaluated by provider at another office but no cause of symptoms was found.  Past Medical History  Diagnosis Date  . Allergic rhinitis   . Tremor   . Neuropathy   . Hypothyroidism   . History of migraine headaches     only one in the past  . Nonischemic cardiomyopathy     EF 50% 2014 at [facility]  . Congenital anomaly of superior vena cava     R Ht cath 7/12:  NL pressures, no pulmonary hypertension and normal wedge pressure; probable congenital anomaly with at least a left sided SVC going into the coronary sinus.  Right-sided sats were somewhat elevated, although they seem to be elevated uniformly from the PA, RA and RV.  consider bubble study from left arm or cardiac MRI or CT  . H/O hiatal hernia     found during endoscopy in 2012  . Arthritis     hip, knees, feet, ankles  . Depression with anxiety 12/28/2010  . CAD (coronary artery disease)     Nonobstructive by cath 7/12:  50% proximal LAD  . Postmenopausal bleeding 08/23/2012  . Vaginitis and vulvovaginitis 10/21/2012  . Left knee pain 02/11/2013  . Pneumonia unknown    hx  . TMJ (dislocation of temporomandibular joint) 03/20/2013  . Thyromegaly 03/21/2013  . Atypical chest pain 09/26/2013  . Breast cancer     Current Outpatient Prescriptions on File Prior to Visit  Medication Sig Dispense Refill  . ALPRAZolam (XANAX) 0.5 MG tablet Take 0.25 mg by mouth at bedtime as needed for anxiety or sleep.      Marland Kitchen buPROPion (WELLBUTRIN XL) 150 MG 24 hr tablet Take 1 tablet (150 mg total) by mouth daily.  30 tablet  2  . furosemide (LASIX) 40 MG tablet Take 40 mg by mouth daily as needed for fluid or edema.      Marland Kitchen HYDROcodone-acetaminophen (NORCO) 10-325 MG per tablet Take 0.5 tablets by mouth every 6 (six) hours as  needed for moderate pain or severe pain.       . hyoscyamine (LEVSIN, ANASPAZ) 0.125 MG tablet Take 0.125 mg by mouth every 4 (four) hours as needed for cramping.      Marland Kitchen levothyroxine (SYNTHROID, LEVOTHROID) 75 MCG tablet take 1 tablet by mouth once daily  30 tablet  3  . ondansetron (ZOFRAN) 8 MG tablet        No current facility-administered medications on file prior to visit.    Allergies  Allergen Reactions  . Lamictal [Lamotrigine] Rash    Steven's Johnson Syndrome  . Morphine And Related Anaphylaxis  . Cymbalta [Duloxetine Hcl] Nausea Only    Personality changes  . Sertraline Hcl Itching    "feel weird"    Family History  Problem Relation Age of Onset  . COPD Mother   . Breast cancer Maternal Grandmother   . Tuberculosis Maternal Grandmother   . Stroke Maternal Grandmother   . Allergies Father   . Heart disease Father   . Stroke Father   . Heart attack Father   . Allergies Sister   . Deep vein thrombosis Sister     History   Social History  . Marital Status: Married    Spouse Name: N/A    Number of Children:  N/A  . Years of Education: N/A   Occupational History  . homemaker    Social History Main Topics  . Smoking status: Never Smoker   . Smokeless tobacco: Never Used  . Alcohol Use: Yes     Comment: occasionally   . Drug Use: No  . Sexual Activity: None   Other Topics Concern  . None   Social History Narrative   Lives in [place]   Has been married for 4 yerars.  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  All other ROS are negative.  BP 108/70  Pulse 87  Temp(Src) 98 F (36.7 C) (Oral)  Resp 16  Ht _0  (1.651 m)  Wt 153 lb 8 oz (69.627 kg)  BMI 25.54 kg/m2  SpO2 99%  Physical Exam  Vitals reviewed. Constitutional: She is oriented to person, place, and time and well-developed, well-nourished, and in no distress.  HENT:  Head: Normocephalic and atraumatic.  Right  Ear: Tympanic membrane normal. There is swelling and tenderness.  Left Ear: Tympanic membrane, external ear and ear canal normal.  Nose: Right sinus exhibits frontal sinus tenderness. Right sinus exhibits no maxillary sinus tenderness. Left sinus exhibits frontal sinus tenderness. Left sinus exhibits no maxillary sinus tenderness.  Mouth/Throat: Uvula is midline, oropharynx is clear and moist and mucous membranes are normal.  Presence of whitish scaly regions of right ear canal noted with swelling of canal.  Eyes: Conjunctivae are normal. Pupils are equal, round, and reactive to light.  Neck: Neck supple.  Cardiovascular: Normal rate, regular rhythm, normal heart sounds and intact distal pulses.   Pulmonary/Chest: Effort normal and breath sounds normal. No respiratory distress. She has no wheezes. She has no rales. She exhibits no tenderness.  Lymphadenopathy:    She has no cervical adenopathy.  Neurological: She is alert and oriented to person, place, and time.  Skin: Skin is warm and dry. No rash noted.  Psychiatric: Affect normal.    Recent Results (from the past 2160 hour(s))  CBC WITH DIFFERENTIAL     Status: Abnormal   Collection Time    12/24/13  7:09 PM      Result Value Ref Range   WBC 5.9  4.0 - 10.5 K/uL   RBC 3.85 (*) 3.87 - 5.11 MIL/uL   Hemoglobin 12.3  12.0 - 15.0 g/dL   HCT 36.5  36.0 - 46.0 %   MCV 94.8  78.0 - 100.0 fL   MCH 31.9  26.0 - 34.0 pg   MCHC 33.7  30.0 - 36.0 g/dL   RDW 12.3  11.5 - 15.5 %   Platelets 233  150 - 400 K/uL   Neutrophils Relative % 54  43 - 77 %   Neutro Abs 3.2  1.7 - 7.7 K/uL   Lymphocytes Relative 31  12 - 46 %   Lymphs Abs 1.8  0.7 - 4.0 K/uL   Monocytes Relative 11  3 - 12 %   Monocytes Absolute 0.7  0.1 - 1.0 K/uL   Eosinophils Relative 3  0 - 5 %   Eosinophils Absolute 0.2  0.0 - 0.7 K/uL   Basophils Relative 1  0 - 1 %   Basophils Absolute 0.0  0.0 - 0.1 K/uL  BASIC METABOLIC PANEL     Status: Abnormal   Collection Time     12/24/13  7:09 PM      Result Value  Ref Range   Sodium 136 (*) 137 - 147 mEq/L   Potassium 3.9  3.7 - 5.3 mEq/L   Chloride 98  96 - 112 mEq/L   CO2 26  19 - 32 mEq/L   Glucose, Bld 77  70 - 99 mg/dL   BUN 20  6 - 23 mg/dL   Creatinine, Ser 0.82  0.50 - 1.10 mg/dL   Calcium 8.7  8.4 - 10.5 mg/dL   GFR calc non Af Amer 79 (*) >90 mL/min   GFR calc Af Amer >90  >90 mL/min   Comment: (NOTE)     The eGFR has been calculated using the CKD EPI equation.     This calculation has not been validated in all clinical situations.     eGFR's persistently <90 mL/min signify possible Chronic Kidney     Disease.  LIPID PANEL     Status: Abnormal   Collection Time    01/25/14 10:36 AM      Result Value Ref Range   Cholesterol 170  0 - 200 mg/dL   Comment: ATP III Classification:           < 200        mg/dL        Desirable          200 - 239     mg/dL        Borderline High          >= 240        mg/dL        High         Triglycerides 48  <150 mg/dL   HDL 50  >39 mg/dL   Total CHOL/HDL Ratio 3.4     VLDL 10  0 - 40 mg/dL   LDL Cholesterol 110 (*) 0 - 99 mg/dL   Comment:       Total Cholesterol/HDL Ratio:CHD Risk                            Coronary Heart Disease Risk Table                                            Men       Women              1/2 Average Risk              3.4        3.3                  Average Risk              5.0        4.4               2X Average Risk              9.6        7.1               3X Average Risk             23.4       11.0     Use the calculated Patient Ratio above and the CHD Risk table      to determine the patient's CHD Risk.     ATP III  Classification (LDL):           < 100        mg/dL         Optimal          100 - 129     mg/dL         Near or Above Optimal          130 - 159     mg/dL         Borderline High          160 - 189     mg/dL         High           > 190        mg/dL         Very High        RENAL FUNCTION PANEL     Status: None    Collection Time    01/25/14 10:36 AM      Result Value Ref Range   Sodium 142  135 - 145 mEq/L   Potassium 4.5  3.5 - 5.3 mEq/L   Chloride 106  96 - 112 mEq/L   CO2 28  19 - 32 mEq/L   Glucose, Bld 81  70 - 99 mg/dL   BUN 21  6 - 23 mg/dL   Creat 0.87  0.50 - 1.10 mg/dL   Albumin 4.1  3.5 - 5.2 g/dL   Calcium 8.9  8.4 - 10.5 mg/dL   Phosphorus 3.1  2.3 - 4.6 mg/dL  CBC     Status: None   Collection Time    01/25/14 10:36 AM      Result Value Ref Range   WBC 5.2  4.0 - 10.5 K/uL   RBC 4.39  3.87 - 5.11 MIL/uL   Hemoglobin 13.9  12.0 - 15.0 g/dL   HCT 40.3  36.0 - 46.0 %   MCV 91.8  78.0 - 100.0 fL   MCH 31.7  26.0 - 34.0 pg   MCHC 34.5  30.0 - 36.0 g/dL   RDW 13.3  11.5 - 15.5 %   Platelets 235  150 - 400 K/uL  TSH     Status: None   Collection Time    01/25/14 10:36 AM      Result Value Ref Range   TSH 0.654  0.350 - 4.500 uIU/mL  HEPATIC FUNCTION PANEL     Status: None   Collection Time    01/25/14 10:36 AM      Result Value Ref Range   Total Bilirubin 0.5  0.2 - 1.2 mg/dL   Bilirubin, Direct 0.1  0.0 - 0.3 mg/dL   Indirect Bilirubin 0.4  0.2 - 1.2 mg/dL   Alkaline Phosphatase 87  39 - 117 U/L   AST 26  0 - 37 U/L   ALT 22  0 - 35 U/L   Total Protein 6.4  6.0 - 8.3 g/dL   Albumin 4.1  3.5 - 5.2 g/dL  CBC WITH DIFFERENTIAL     Status: None   Collection Time    01/28/14  4:14 PM      Result Value Ref Range   WBC 7.1  3.9 - 10.3 10e3/uL   NEUT# 4.0  1.5 - 6.5 10e3/uL   HGB 14.7  11.6 - 15.9 g/dL   HCT 45.8  34.8 - 46.6 %   Platelets 248  145 - 400 10e3/uL   MCV 98.2  79.5 - 101.0 fL   MCH 31.6  25.1 - 34.0 pg   MCHC 32.2  31.5 - 36.0 g/dL   RBC 4.67  3.70 - 5.45 10e6/uL   RDW 13.2  11.2 - 14.5 %   lymph# 2.4  0.9 - 3.3 10e3/uL   MONO# 0.6  0.1 - 0.9 10e3/uL   Eosinophils Absolute 0.1  0.0 - 0.5 10e3/uL   Basophils Absolute 0.1  0.0 - 0.1 10e3/uL   NEUT% 55.6  38.4 - 76.8 %   LYMPH% 33.3  14.0 - 49.7 %   MONO% 7.8  0.0 - 14.0 %   EOS% 2.0  0.0 - 7.0 %    BASO% 1.3  0.0 - 2.0 %  COMPREHENSIVE METABOLIC PANEL (QI16)     Status: None   Collection Time    01/28/14  4:14 PM      Result Value Ref Range   Sodium 141  136 - 145 mEq/L   Potassium 3.9  3.5 - 5.1 mEq/L   Chloride 106  98 - 109 mEq/L   CO2 28  22 - 29 mEq/L   Glucose 92  70 - 140 mg/dl   BUN 21.3  7.0 - 26.0 mg/dL   Creatinine 0.9  0.6 - 1.1 mg/dL   Total Bilirubin 0.31  0.20 - 1.20 mg/dL   Alkaline Phosphatase 98  40 - 150 U/L   AST 33  5 - 34 U/L   ALT 24  0 - 55 U/L   Total Protein 7.3  6.4 - 8.3 g/dL   Albumin 3.9  3.5 - 5.0 g/dL   Calcium 9.2  8.4 - 10.4 mg/dL   Anion Gap 8  3 - 11 mEq/L    Assessment/Plan: Bacterial sinusitis Rx Amoxicillin.  Increase fluid intake. Rest.  Saline nasal spray.  Probiotic.  Mucinex.  Humidifier in bedroom.  Flonase and Claritin. Return precautions discussed with patient.    Fungal otitis externa Rx Vosol-HC drops to apply to right ear x 1 week.

## 2014-03-06 NOTE — Patient Instructions (Signed)
Please take antibiotic as directed.  Increase fluid intake.  Use Saline nasal spray.  Take a daily multivitamin. Daily claritin and flonase.  Place a humidifier in the bedroom.  Please call or return clinic if symptoms are not improving.  For the ear itching, it seems there is a slight fungal/yeast infection of the ear cana.  Use the Vosol drops as directed for 1 week.  Sinusitis Sinusitis is redness, soreness, and swelling (inflammation) of the paranasal sinuses. Paranasal sinuses are air pockets within the bones of your face (beneath the eyes, the middle of the forehead, or above the eyes). In healthy paranasal sinuses, mucus is able to drain out, and air is able to circulate through them by way of your nose. However, when your paranasal sinuses are inflamed, mucus and air can become trapped. This can allow bacteria and other germs to grow and cause infection. Sinusitis can develop quickly and last only a short time (acute) or continue over a long period (chronic). Sinusitis that lasts for more than 12 weeks is considered chronic.  CAUSES  Causes of sinusitis include:  Allergies.  Structural abnormalities, such as displacement of the cartilage that separates your nostrils (deviated septum), which can decrease the air flow through your nose and sinuses and affect sinus drainage.  Functional abnormalities, such as when the small hairs (cilia) that line your sinuses and help remove mucus do not work properly or are not present. SYMPTOMS  Symptoms of acute and chronic sinusitis are the same. The primary symptoms are pain and pressure around the affected sinuses. Other symptoms include:  Upper toothache.  Earache.  Headache.  Bad breath.  Decreased sense of smell and taste.  A cough, which worsens when you are lying flat.  Fatigue.  Fever.  Thick drainage from your nose, which often is green and may contain pus (purulent).  Swelling and warmth over the affected sinuses. DIAGNOSIS    Your caregiver will perform a physical exam. During the exam, your caregiver may:  Look in your nose for signs of abnormal growths in your nostrils (nasal polyps).  Tap over the affected sinus to check for signs of infection.  View the inside of your sinuses (endoscopy) with a special imaging device with a light attached (endoscope), which is inserted into your sinuses. If your caregiver suspects that you have chronic sinusitis, one or more of the following tests may be recommended:  Allergy tests.  Nasal culture A sample of mucus is taken from your nose and sent to a lab and screened for bacteria.  Nasal cytology A sample of mucus is taken from your nose and examined by your caregiver to determine if your sinusitis is related to an allergy. TREATMENT  Most cases of acute sinusitis are related to a viral infection and will resolve on their own within 10 days. Sometimes medicines are prescribed to help relieve symptoms (pain medicine, decongestants, nasal steroid sprays, or saline sprays).  However, for sinusitis related to a bacterial infection, your caregiver will prescribe antibiotic medicines. These are medicines that will help kill the bacteria causing the infection.  Rarely, sinusitis is caused by a fungal infection. In theses cases, your caregiver will prescribe antifungal medicine. For some cases of chronic sinusitis, surgery is needed. Generally, these are cases in which sinusitis recurs more than 3 times per year, despite other treatments. HOME CARE INSTRUCTIONS   Drink plenty of water. Water helps thin the mucus so your sinuses can drain more easily.  Use a humidifier.  Inhale  steam 3 to 4 times a day (for example, sit in the bathroom with the shower running).  Apply a warm, moist washcloth to your face 3 to 4 times a day, or as directed by your caregiver.  Use saline nasal sprays to help moisten and clean your sinuses.  Take over-the-counter or prescription medicines for  pain, discomfort, or fever only as directed by your caregiver. SEEK IMMEDIATE MEDICAL CARE IF:  You have increasing pain or severe headaches.  You have nausea, vomiting, or drowsiness.  You have swelling around your face.  You have vision problems.  You have a stiff neck.  You have difficulty breathing. MAKE SURE YOU:   Understand these instructions.  Will watch your condition.  Will get help right away if you are not doing well or get worse. Document Released: 06/14/2005 Document Revised: 09/06/2011 Document Reviewed: 06/29/2011 [facility] Patient Information 2014 [place], [place].

## 2014-03-06 NOTE — Assessment & Plan Note (Signed)
Rx Amoxicillin.  Increase fluid intake. Rest.  Saline nasal spray.  Probiotic.  Mucinex.  Humidifier in bedroom.  Flonase and Claritin. Return precautions discussed with patient.

## 2014-03-13 ENCOUNTER — Ambulatory Visit: Payer: Medicare Other | Admitting: Physician Assistant

## 2014-03-14 ENCOUNTER — Encounter: Payer: Self-pay | Admitting: Family Medicine

## 2014-03-14 ENCOUNTER — Ambulatory Visit (INDEPENDENT_AMBULATORY_CARE_PROVIDER_SITE_OTHER): Payer: Medicare Other | Admitting: Family Medicine

## 2014-03-14 ENCOUNTER — Other Ambulatory Visit: Payer: Self-pay | Admitting: Family Medicine

## 2014-03-14 VITALS — BP 138/72 | HR 98 | Temp 98.4°F | Ht 65.0 in | Wt 154.6 lb

## 2014-03-14 DIAGNOSIS — T7840XD Allergy, unspecified, subsequent encounter: Secondary | ICD-10-CM

## 2014-03-14 DIAGNOSIS — H60391 Other infective otitis externa, right ear: Secondary | ICD-10-CM

## 2014-03-14 DIAGNOSIS — B9689 Other specified bacterial agents as the cause of diseases classified elsewhere: Secondary | ICD-10-CM

## 2014-03-14 DIAGNOSIS — B49 Unspecified mycosis: Secondary | ICD-10-CM

## 2014-03-14 DIAGNOSIS — H6240 Otitis externa in other diseases classified elsewhere, unspecified ear: Secondary | ICD-10-CM

## 2014-03-14 DIAGNOSIS — H60399 Other infective otitis externa, unspecified ear: Secondary | ICD-10-CM

## 2014-03-14 DIAGNOSIS — Z5189 Encounter for other specified aftercare: Secondary | ICD-10-CM

## 2014-03-14 DIAGNOSIS — F341 Dysthymic disorder: Secondary | ICD-10-CM

## 2014-03-14 DIAGNOSIS — J329 Chronic sinusitis, unspecified: Secondary | ICD-10-CM

## 2014-03-14 DIAGNOSIS — A499 Bacterial infection, unspecified: Secondary | ICD-10-CM

## 2014-03-14 DIAGNOSIS — B369 Superficial mycosis, unspecified: Secondary | ICD-10-CM

## 2014-03-14 DIAGNOSIS — F418 Other specified anxiety disorders: Secondary | ICD-10-CM

## 2014-03-14 MED ORDER — BUPROPION HCL ER (XL) 300 MG PO TB24: 300.0000 mg | ORAL_TABLET | Freq: Every day | ORAL | Status: DC

## 2014-03-14 MED ORDER — FLUCONAZOLE 150 MG PO TABS: 150.0000 mg | ORAL_TABLET | ORAL | Status: DC

## 2014-03-14 MED ORDER — LORATADINE 10 MG PO TABS: 10.0000 mg | ORAL_TABLET | Freq: Every day | ORAL | Status: DC | PRN

## 2014-03-14 NOTE — Telephone Encounter (Signed)
Faxed to pharmacy

## 2014-03-14 NOTE — Progress Notes (Signed)
Pre visit review using our clinic review tool, if applicable. No additional management support is needed unless otherwise documented below in the visit note. 

## 2014-03-14 NOTE — Telephone Encounter (Signed)
Please advise refill? Looks like RX was given by another provider?

## 2014-03-14 NOTE — Patient Instructions (Addendum)
Encouraged increased hydration and fiber in diet. Daily probiotics. If bowels not moving can use MOM 2 tbls po in 4 oz of warm prune juice by mouth every 2-3 days. If no results then repeat in 4 hours with  Dulcolax suppository pr, may repeat again in 4 more hours as needed. Seek care if symptoms worsen. Consider daily Miralax and/or Dulcolax if symptoms persist.   hydrogen peroxide in ears x 2 nights then restart ear drops  Otitis Media Otitis media is redness, soreness, and inflammation of the middle ear. Otitis media may be caused by allergies or, most commonly, by infection. Often it occurs as a complication of the common cold. SIGNS AND SYMPTOMS Symptoms of otitis media may include:  Earache.  Fever.  Ringing in your ear.  Headache.  Leakage of fluid from the ear. DIAGNOSIS To diagnose otitis media, your health care provider will examine your ear with an otoscope. This is an instrument that allows your health care provider to see into your ear in order to examine your eardrum. Your health care provider also will ask you questions about your symptoms. TREATMENT  Typically, otitis media resolves on its own within 3-5 days. Your health care provider may prescribe medicine to ease your symptoms of pain. If otitis media does not resolve within 5 days or is recurrent, your health care provider may prescribe antibiotic medicines if he or she suspects that a bacterial infection is the cause. HOME CARE INSTRUCTIONS   If you were prescribed an antibiotic medicine, finish it all even if you start to feel better.  Take medicines only as directed by your health care provider.  Keep all follow-up visits as directed by your health care provider. SEEK MEDICAL CARE IF:  You have otitis media only in one ear, or bleeding from your nose, or both.  You notice a lump on your neck.  You are not getting better in 3-5 days.  You feel worse instead of better. SEEK IMMEDIATE MEDICAL CARE IF:   You  have pain that is not controlled with medicine.  You have swelling, redness, or pain around your ear or stiffness in your neck.  You notice that part of your face is paralyzed.  You notice that the bone behind your ear (mastoid) is tender when you touch it. MAKE SURE YOU:   Understand these instructions.  Will watch your condition.  Will get help right away if you are not doing well or get worse. Document Released: 03/19/2004 Document Revised: 10/29/2013 Document Reviewed: 01/09/2013 [facility] Patient Information 2015 [place], [place]. This information is not intended to replace advice given to you by your health care provider. Make sure you discuss any questions you have with your health care provider.

## 2014-03-17 ENCOUNTER — Encounter: Payer: Self-pay | Admitting: Family Medicine

## 2014-03-17 NOTE — Assessment & Plan Note (Signed)
Given rx for Diflucan and continue ear drops.

## 2014-03-17 NOTE — Progress Notes (Signed)
Patient ID: [name], female   DOB: 1955-08-27, 58 y.o.   MRN: [id number] CHEYNE BUNGERT [id number] [birth year] 03/17/2014      Progress Note-Follow Up  Subjective  Chief Complaint  Chief Complaint  Patient presents with  . Follow-up    10 week follow up    HPI  Patient is a 58 year old female in today for routine medical care. Patient is in today for followup on a year and sinus infection. She changed in headache and congestion. Has a slight sense of nausea dizziness and some low-grade fevers and chills. Her ears continued a PTT and only mildly uncomfortable. Denies CP/palp/SOB/HA/congestion/fevers/GI or GU c/o. Taking meds as prescribed  Past Medical History  Diagnosis Date  . Allergic rhinitis   . Tremor   . Neuropathy   . Hypothyroidism   . History of migraine headaches     only one in the past  . Nonischemic cardiomyopathy     EF 50% 2014 at [facility]  . Congenital anomaly of superior vena cava     R Ht cath 7/12:  NL pressures, no pulmonary hypertension and normal wedge pressure; probable congenital anomaly with at least a left sided SVC going into the coronary sinus.  Right-sided sats were somewhat elevated, although they seem to be elevated uniformly from the PA, RA and RV.  consider bubble study from left arm or cardiac MRI or CT  . H/O hiatal hernia     found during endoscopy in 2012  . Arthritis     hip, knees, feet, ankles  . Depression with anxiety 12/28/2010  . CAD (coronary artery disease)     Nonobstructive by cath 7/12:  50% proximal LAD  . Postmenopausal bleeding 08/23/2012  . Vaginitis and vulvovaginitis 10/21/2012  . Left knee pain 02/11/2013  . Pneumonia unknown    hx  . TMJ (dislocation of temporomandibular joint) 03/20/2013  . Thyromegaly 03/21/2013  . Atypical chest pain 09/26/2013  . Breast cancer     Past Surgical History  Procedure Laterality Date  . Bone marrow transplant  01/95  . Cervical conization w/bx    . Arthoscopy      rt. knee  . Axillary  node dissection Right 94  . Port-a-cath removed    . Hickman placed    . Biopsy breast      L & R  . Combined hysteroscopy diagnostic / d&c    . Hysteroscopy w/d&c N/A 08/23/2012    Procedure: DILATATION AND CURETTAGE /HYSTEROSCOPY;  Surgeon: [name], MD;  Location: [facility];  Service: Gynecology;  Laterality: N/A;  Removal of expelled essure coil  . Breast biopsy Left 02/20/2013    Procedure: LEFT BREAST CENTRAL DUCT EXCISION;  Surgeon: [name], MD;  Location: [place];  Service: General;  Laterality: Left;  . Bone marrow harvest    . Mastectomy, partial  1994    right    Family History  Problem Relation Age of Onset  . COPD Mother   . Breast cancer Maternal Grandmother   . Tuberculosis Maternal Grandmother   . Stroke Maternal Grandmother   . Allergies Father   . Heart disease Father   . Stroke Father   . Heart attack Father   . Allergies Sister   . Deep vein thrombosis Sister     History   Social History  . Marital Status: Married    Spouse Name: N/A    Number of Children: N/A  . Years of Education: N/A  Occupational History  . homemaker    Social History Main Topics  . Smoking status: Never Smoker   . Smokeless tobacco: Never Used  . Alcohol Use: Yes     Comment: occasionally   . Drug Use: No  . Sexual Activity: Not on file   Other Topics Concern  . Not on file   Social History Narrative   Lives in [place]   Has been married for 4 yerars.  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 acetic acid-hydrocortisone (VOSOL-HC) otic solution Place 3 drops into the right ear 2 (two) times daily.  10 mL  0  . amoxicillin (AMOXIL) 875 MG tablet Take 1 tablet (875 mg total) by mouth 2 (two) times daily.  20 tablet  0  . furosemide (LASIX) 40 MG tablet Take 40 mg by mouth daily as needed for fluid or edema.      Marland Kitchen  HYDROcodone-acetaminophen (NORCO) 10-325 MG per tablet Take 0.5 tablets by mouth every 6 (six) hours as needed for moderate pain or severe pain.       . hyoscyamine (LEVSIN, ANASPAZ) 0.125 MG tablet Take 0.125 mg by mouth every 4 (four) hours as needed for cramping.      Marland Kitchen levothyroxine (SYNTHROID, LEVOTHROID) 75 MCG tablet take 1 tablet by mouth once daily  30 tablet  3  . ondansetron (ZOFRAN) 8 MG tablet        No current facility-administered medications on file prior to visit.    Allergies  Allergen Reactions  . Lamictal [Lamotrigine] Rash    Steven's Johnson Syndrome  . Morphine And Related Anaphylaxis  . Cymbalta [Duloxetine Hcl] Nausea Only    Personality changes  . Sertraline Hcl Itching    "feel weird"    Review of Systems  Review of Systems  Constitutional: Positive for fever and malaise/fatigue.  HENT: Positive for congestion, ear discharge and ear pain.   Eyes: Negative for discharge.  Respiratory: Negative for shortness of breath.   Cardiovascular: Negative for chest pain, palpitations and leg swelling.  Gastrointestinal: Negative for nausea, vomiting, abdominal pain and diarrhea.  Genitourinary: Negative for dysuria.  Musculoskeletal: Positive for myalgias. Negative for falls.  Skin: Negative for rash.  Neurological: Positive for headaches. Negative for loss of consciousness.  Endo/Heme/Allergies: Negative for polydipsia.  Psychiatric/Behavioral: Negative for depression and suicidal ideas. The patient is not nervous/anxious and does not have insomnia.     Objective  BP 138/72  Pulse 98  Temp(Src) 98.4 F (36.9 C) (Oral)  Ht 5\' 5"  (1.651 m)  Wt 154 lb 9.6 oz (70.126 kg)  BMI 25.73 kg/m2  SpO2 100%  Physical Exam  Physical Exam  Constitutional: She is oriented to person, place, and time and well-developed, well-nourished, and in no distress. No distress.  HENT:  Head: Normocephalic and atraumatic.  B/l ear canals mildly erythematous  Eyes:  Conjunctivae are normal.  Neck: Neck supple. No thyromegaly present.  Cardiovascular: Normal rate, regular rhythm and normal heart sounds.   No murmur heard. Pulmonary/Chest: Effort normal and breath sounds normal. She has no wheezes.  Abdominal: She exhibits no distension and no mass.  Musculoskeletal: She exhibits no edema.  Lymphadenopathy:    She has no cervical adenopathy.  Neurological: She is alert and oriented to person, place, and time.  Skin: Skin is warm and dry. No rash noted. She  is not diaphoretic.  Psychiatric: Memory, affect and judgment normal.    Lab Results  Component Value Date   TSH 0.654 01/25/2014   Lab Results  Component Value Date   WBC 7.1 01/28/2014   HGB 14.7 01/28/2014   HCT 45.8 01/28/2014   MCV 98.2 01/28/2014   PLT 248 01/28/2014   Lab Results  Component Value Date   CREATININE 0.9 01/28/2014   BUN 21.3 01/28/2014   NA 141 01/28/2014   K 3.9 01/28/2014   CL 106 01/25/2014   CO2 28 01/28/2014   Lab Results  Component Value Date   ALT 24 01/28/2014   AST 33 01/28/2014   ALKPHOS 98 01/28/2014   BILITOT 0.31 01/28/2014   Lab Results  Component Value Date   CHOL 170 01/25/2014   Lab Results  Component Value Date   HDL 50 01/25/2014   Lab Results  Component Value Date   LDLCALC 110* 01/25/2014   Lab Results  Component Value Date   TRIG 48 01/25/2014   Lab Results  Component Value Date   CHOLHDL 3.4 01/25/2014     Assessment & Plan  Fungal otitis externa Given rx for Diflucan and continue ear drops.   Bacterial sinusitis Start antibiotics, mucinex and probiotics

## 2014-03-17 NOTE — Assessment & Plan Note (Signed)
Start antibiotics, mucinex and probiotics

## 2014-03-18 ENCOUNTER — Other Ambulatory Visit: Payer: Self-pay | Admitting: Family Medicine

## 2014-03-18 ENCOUNTER — Telehealth: Payer: Self-pay | Admitting: Family Medicine

## 2014-03-18 DIAGNOSIS — H6690 Otitis media, unspecified, unspecified ear: Secondary | ICD-10-CM

## 2014-03-18 NOTE — Telephone Encounter (Signed)
I will have to refer her to ENT then I can say this often can take even months to clear up. There is not a single pill that will address each case. I can pick an ENT or she can let me know whom she would like to see. Does she have any new symptoms? Or how have the symptoms changed? So I can think about if there is anything else I can try while we wait

## 2014-03-18 NOTE — Telephone Encounter (Signed)
Caller name: Laurynn Relation to pt: self Call back number: [phone] Pharmacy:   Reason for call:   Patient states that she has finished the round of antiobiotics last Friday and still has an earache and having a hard time hearing. She would like to know what Dr. Charlett Blake recommends she do? Also, patient is hoping to have this cleared up by 03/26/14 for her knee surgery.

## 2014-03-18 NOTE — Telephone Encounter (Signed)
Caller name:Haydan  Relation to pt: self  Call back number: [phone]   Reason for call:   pt can not get an appointment with her ENT Dr. Redmond Baseman for another week until the 28th (pt states her ear feels like it would rupture if she had to wait until the 28th).  Pt is requesting a referral please advise.

## 2014-03-18 NOTE — Telephone Encounter (Signed)
Have her start a Medrol dosepak and Levaquin 500 mg po daily x 10 days at the same time while we wait on the appt with Dr Redmond Baseman. I will try and put in a referral and get her in sooner but this might help

## 2014-03-18 NOTE — Telephone Encounter (Signed)
Ear is aching worse, feels like she is in a tunnel.  Dr Redmond Baseman is who she saw in the past.  Pt is going to call and schedule an appt and if they need a new referral she will let us know

## 2014-03-19 ENCOUNTER — Telehealth: Payer: Self-pay

## 2014-03-19 MED ORDER — LEVOFLOXACIN 500 MG PO TABS: 500.0000 mg | ORAL_TABLET | Freq: Every day | ORAL | Status: DC

## 2014-03-19 MED ORDER — METHYLPREDNISOLONE 4 MG PO KIT: PACK | ORAL | Status: DC

## 2014-03-19 NOTE — Telephone Encounter (Signed)
RX's sent. Lm for patient to return my call

## 2014-03-19 NOTE — Telephone Encounter (Signed)
FYI- Dr. Redmond Baseman ENT will squeeze pt in today at 1pm. Pt is also aware RX was sent to pharmacy.

## 2014-03-19 NOTE — Telephone Encounter (Signed)
Pharmacy called to advise that there is a drug interaction between levaquin and diflucan that leads to elevated QT waves and arrhythmias. Consulted with Dr Birdie Riddle who advises to finish levaquin before starting diflucan. Advised pharmacy.

## 2014-03-20 ENCOUNTER — Encounter (HOSPITAL_BASED_OUTPATIENT_CLINIC_OR_DEPARTMENT_OTHER): Payer: Self-pay | Admitting: *Deleted

## 2014-03-21 ENCOUNTER — Encounter (HOSPITAL_BASED_OUTPATIENT_CLINIC_OR_DEPARTMENT_OTHER): Payer: Self-pay | Admitting: *Deleted

## 2014-03-21 NOTE — Progress Notes (Signed)
NPO AFTER MN WITH EXCEPTION CLEAR LIQUIDS UNTIL 0830 (NO CREAM/ MILK PRODUCTS).  ARRIVE AT 6468. NEEDS HG.  CURRENT EKG IN CHART. WILL TAKE SYNTHROID AND WELLBUTRIN AM DOS W/ SIPS OF WATER AND IF NEEDED MAY TAKE PAIN RX.

## 2014-03-25 NOTE — Anesthesia Preprocedure Evaluation (Addendum)
Anesthesia Evaluation  Patient identified by MRN, date of birth, ID band Patient awake    Reviewed: Allergy & Precautions, H&P , NPO status , Patient's Chart, lab work & pertinent test results  History of Anesthesia Complications Negative for: history of anesthetic complications  Airway Mallampati: II TM Distance: >3 FB Neck ROM: Full    Dental no notable dental hx.    Pulmonary neg pulmonary ROS,  breath sounds clear to auscultation  Pulmonary exam normal       Cardiovascular Exercise Tolerance: Good + CAD and +CHF Rhythm:Regular Rate:Normal  Evaluated by [facility] and last echo demonstrated EF 50% in 2014. Cath report demonstrated 50% LAD nonobstructive plaque and no stents in place. Patient reports that she has excellent exercise tolerance and goes to the gym daily   Neuro/Psych PSYCHIATRIC DISORDERS Anxiety Depression negative neurological ROS     GI/Hepatic Neg liver ROS, GERD-  Medicated and Controlled,  Endo/Other  Hypothyroidism   Renal/GU negative Renal ROS  negative genitourinary   Musculoskeletal  (+) Arthritis -, Osteoarthritis,    Abdominal   Peds negative pediatric ROS (+)  Hematology negative hematology ROS (+)   Anesthesia Other Findings   Reproductive/Obstetrics negative OB ROS                          Anesthesia Physical Anesthesia Plan  ASA: II  Anesthesia Plan: General   Post-op Pain Management:    Induction: Intravenous  Airway Management Planned: LMA  Additional Equipment:   Intra-op Plan:   Post-operative Plan: Extubation in OR  Informed Consent: I have reviewed the patients History and Physical, chart, labs and discussed the procedure including the risks, benefits and alternatives for the proposed anesthesia with the patient or authorized representative who has indicated his/her understanding and acceptance.   Dental advisory given  Plan Discussed  with: CRNA  Anesthesia Plan Comments:         Anesthesia Quick Evaluation

## 2014-03-26 ENCOUNTER — Ambulatory Visit (HOSPITAL_BASED_OUTPATIENT_CLINIC_OR_DEPARTMENT_OTHER): Payer: Medicare Other | Admitting: Anesthesiology

## 2014-03-26 ENCOUNTER — Encounter (HOSPITAL_BASED_OUTPATIENT_CLINIC_OR_DEPARTMENT_OTHER): Payer: Self-pay | Admitting: *Deleted

## 2014-03-26 ENCOUNTER — Ambulatory Visit (HOSPITAL_BASED_OUTPATIENT_CLINIC_OR_DEPARTMENT_OTHER)
Admission: RE | Admit: 2014-03-26 | Discharge: 2014-03-26 | Disposition: A | Discharge status: Home / Self Care | Payer: Medicare Other | Source: Ambulatory Visit | Attending: Specialist | Admitting: Specialist

## 2014-03-26 ENCOUNTER — Encounter (HOSPITAL_BASED_OUTPATIENT_CLINIC_OR_DEPARTMENT_OTHER): Payer: Medicare Other | Admitting: Anesthesiology

## 2014-03-26 ENCOUNTER — Other Ambulatory Visit: Payer: Self-pay | Admitting: Orthopedic Surgery

## 2014-03-26 ENCOUNTER — Encounter (HOSPITAL_BASED_OUTPATIENT_CLINIC_OR_DEPARTMENT_OTHER)
Admission: RE | Disposition: A | Discharge status: Home / Self Care | Payer: Self-pay | Source: Ambulatory Visit | Attending: Specialist

## 2014-03-26 DIAGNOSIS — F329 Major depressive disorder, single episode, unspecified: Secondary | ICD-10-CM | POA: Diagnosis not present

## 2014-03-26 DIAGNOSIS — K589 Irritable bowel syndrome without diarrhea: Secondary | ICD-10-CM | POA: Insufficient documentation

## 2014-03-26 DIAGNOSIS — M959 Acquired deformity of musculoskeletal system, unspecified: Secondary | ICD-10-CM | POA: Diagnosis not present

## 2014-03-26 DIAGNOSIS — Z9481 Bone marrow transplant status: Secondary | ICD-10-CM | POA: Diagnosis not present

## 2014-03-26 DIAGNOSIS — Z79899 Other long term (current) drug therapy: Secondary | ICD-10-CM | POA: Insufficient documentation

## 2014-03-26 DIAGNOSIS — Z860100 Personal history of colon polyps, unspecified: Secondary | ICD-10-CM | POA: Insufficient documentation

## 2014-03-26 DIAGNOSIS — S83289A Other tear of lateral meniscus, current injury, unspecified knee, initial encounter: Secondary | ICD-10-CM | POA: Insufficient documentation

## 2014-03-26 DIAGNOSIS — F411 Generalized anxiety disorder: Secondary | ICD-10-CM | POA: Insufficient documentation

## 2014-03-26 DIAGNOSIS — E039 Hypothyroidism, unspecified: Secondary | ICD-10-CM | POA: Diagnosis not present

## 2014-03-26 DIAGNOSIS — X58XXXA Exposure to other specified factors, initial encounter: Secondary | ICD-10-CM | POA: Insufficient documentation

## 2014-03-26 DIAGNOSIS — F3289 Other specified depressive episodes: Secondary | ICD-10-CM | POA: Insufficient documentation

## 2014-03-26 DIAGNOSIS — M9420 Chondromalacia, unspecified site: Secondary | ICD-10-CM | POA: Diagnosis not present

## 2014-03-26 DIAGNOSIS — I2510 Atherosclerotic heart disease of native coronary artery without angina pectoris: Secondary | ICD-10-CM | POA: Insufficient documentation

## 2014-03-26 DIAGNOSIS — I428 Other cardiomyopathies: Secondary | ICD-10-CM | POA: Diagnosis not present

## 2014-03-26 DIAGNOSIS — M1289 Other specific arthropathies, not elsewhere classified, multiple sites: Secondary | ICD-10-CM | POA: Diagnosis not present

## 2014-03-26 DIAGNOSIS — J309 Allergic rhinitis, unspecified: Secondary | ICD-10-CM | POA: Insufficient documentation

## 2014-03-26 DIAGNOSIS — Z888 Allergy status to other drugs, medicaments and biological substances status: Secondary | ICD-10-CM | POA: Insufficient documentation

## 2014-03-26 DIAGNOSIS — M1710 Unilateral primary osteoarthritis, unspecified knee: Secondary | ICD-10-CM | POA: Insufficient documentation

## 2014-03-26 DIAGNOSIS — Z885 Allergy status to narcotic agent status: Secondary | ICD-10-CM | POA: Diagnosis not present

## 2014-03-26 DIAGNOSIS — Z9889 Other specified postprocedural states: Secondary | ICD-10-CM

## 2014-03-26 HISTORY — DX: Personal history of other diseases of the musculoskeletal system and connective tissue: Z87.39

## 2014-03-26 HISTORY — DX: Conversion disorder with motor symptom or deficit: F44.4

## 2014-03-26 HISTORY — DX: Personal history of other mental and behavioral disorders: Z86.59

## 2014-03-26 HISTORY — PX: KNEE ARTHROSCOPY WITH LATERAL MENISECTOMY: SHX6193

## 2014-03-26 HISTORY — DX: Personal history of colonic polyps: Z86.010

## 2014-03-26 HISTORY — PX: CHONDROPLASTY: SHX5177

## 2014-03-26 HISTORY — DX: Personal history of other (healed) physical injury and trauma: Z87.828

## 2014-03-26 HISTORY — DX: Irritable bowel syndrome, unspecified: K58.9

## 2014-03-26 HISTORY — DX: Personal history of colon polyps, unspecified: Z86.0100

## 2014-03-26 HISTORY — DX: Unspecified osteoarthritis, unspecified site: M19.90

## 2014-03-26 HISTORY — PX: KNEE ARTHROSCOPY: SHX127

## 2014-03-26 HISTORY — DX: Bone marrow transplant status: Z94.81

## 2014-03-26 HISTORY — DX: Unspecified tear of unspecified meniscus, current injury, left knee, initial encounter: S83.207A

## 2014-03-26 LAB — POCT HEMOGLOBIN-HEMACUE: Hemoglobin: 13.8 g/dL (ref 12.0–15.0)

## 2014-03-26 SURGERY — CHONDROPLASTY
Anesthesia: General | Site: Knee | Laterality: Left

## 2014-03-26 MED ORDER — MIDAZOLAM HCL 2 MG/2ML IJ SOLN
INTRAMUSCULAR | Status: AC
  Filled 2014-03-26: qty 2

## 2014-03-26 MED ORDER — CEPHALEXIN 500 MG PO CAPS: 500.0000 mg | ORAL_CAPSULE | Freq: Three times a day (TID) | ORAL | Status: DC

## 2014-03-26 MED ORDER — FENTANYL CITRATE 0.05 MG/ML IJ SOLN
INTRAMUSCULAR | Status: DC | PRN
  Administered 2014-03-26: 50 ug via INTRAVENOUS
  Administered 2014-03-26 (×2): 25 ug via INTRAVENOUS
  Administered 2014-03-26: 50 ug via INTRAVENOUS

## 2014-03-26 MED ORDER — FENTANYL CITRATE 0.05 MG/ML IJ SOLN
INTRAMUSCULAR | Status: AC
  Filled 2014-03-26: qty 6

## 2014-03-26 MED ORDER — FENTANYL CITRATE 0.05 MG/ML IJ SOLN
25.0000 ug | INTRAMUSCULAR | Status: DC | PRN
  Administered 2014-03-26 (×2): 25 ug via INTRAVENOUS
  Filled 2014-03-26: qty 1

## 2014-03-26 MED ORDER — CEFAZOLIN SODIUM-DEXTROSE 2-3 GM-% IV SOLR
INTRAVENOUS | Status: AC
  Filled 2014-03-26: qty 50

## 2014-03-26 MED ORDER — DEXAMETHASONE SODIUM PHOSPHATE 4 MG/ML IJ SOLN
INTRAMUSCULAR | Status: DC | PRN
  Administered 2014-03-26: 8 mg via INTRAVENOUS

## 2014-03-26 MED ORDER — ONDANSETRON HCL 4 MG/2ML IJ SOLN
INTRAMUSCULAR | Status: DC | PRN
  Administered 2014-03-26: 4 mg via INTRAVENOUS

## 2014-03-26 MED ORDER — ASPIRIN EC 325 MG PO TBEC: 325.0000 mg | DELAYED_RELEASE_TABLET | Freq: Every day | ORAL | Status: DC

## 2014-03-26 MED ORDER — HYDROCODONE-ACETAMINOPHEN 5-325 MG PO TABS
ORAL_TABLET | ORAL | Status: AC
  Filled 2014-03-26: qty 1

## 2014-03-26 MED ORDER — CEFAZOLIN SODIUM-DEXTROSE 2-3 GM-% IV SOLR
2.0000 g | INTRAVENOUS | Status: AC
  Administered 2014-03-26: 2 g via INTRAVENOUS
  Filled 2014-03-26: qty 50

## 2014-03-26 MED ORDER — MIDAZOLAM HCL 5 MG/5ML IJ SOLN
INTRAMUSCULAR | Status: DC | PRN
  Administered 2014-03-26: 2 mg via INTRAVENOUS

## 2014-03-26 MED ORDER — BUPIVACAINE HCL 0.25 % IJ SOLN
INTRAMUSCULAR | Status: DC | PRN
  Administered 2014-03-26: 30 mL

## 2014-03-26 MED ORDER — ONDANSETRON HCL 4 MG/2ML IJ SOLN
4.0000 mg | Freq: Once | INTRAMUSCULAR | Status: DC | PRN
  Filled 2014-03-26: qty 2

## 2014-03-26 MED ORDER — SODIUM CHLORIDE 0.9 % IR SOLN
Status: DC | PRN
  Administered 2014-03-26: 9000 mL

## 2014-03-26 MED ORDER — LIDOCAINE HCL (CARDIAC) 20 MG/ML IV SOLN
INTRAVENOUS | Status: DC | PRN
  Administered 2014-03-26: 60 mg via INTRAVENOUS

## 2014-03-26 MED ORDER — POVIDONE-IODINE 7.5 % EX SOLN
Freq: Once | CUTANEOUS | Status: DC
  Filled 2014-03-26: qty 118

## 2014-03-26 MED ORDER — PROPOFOL 10 MG/ML IV BOLUS
INTRAVENOUS | Status: DC | PRN
  Administered 2014-03-26: 200 mg via INTRAVENOUS

## 2014-03-26 MED ORDER — FENTANYL CITRATE 0.05 MG/ML IJ SOLN
INTRAMUSCULAR | Status: AC
  Filled 2014-03-26: qty 2

## 2014-03-26 MED ORDER — HYDROCODONE-ACETAMINOPHEN 5-325 MG PO TABS: 1.0000 | ORAL_TABLET | Freq: Four times a day (QID) | ORAL | Status: DC | PRN

## 2014-03-26 MED ORDER — HYDROCODONE-ACETAMINOPHEN 5-325 MG PO TABS
1.0000 | ORAL_TABLET | Freq: Four times a day (QID) | ORAL | Status: DC | PRN
  Administered 2014-03-26: 1 via ORAL
  Filled 2014-03-26: qty 2

## 2014-03-26 MED ORDER — LACTATED RINGERS IV SOLN
INTRAVENOUS | Status: DC
  Administered 2014-03-26: 13:00:00 via INTRAVENOUS
  Filled 2014-03-26: qty 1000

## 2014-03-26 SURGICAL SUPPLY — 60 items
BANDAGE ESMARK 6X9 LF (GAUZE/BANDAGES/DRESSINGS) ×1 IMPLANT
BLADE 4.2CUDA (BLADE) IMPLANT
BLADE CUDA 4.2 (BLADE) IMPLANT
BLADE CUDA GRT WHITE 3.5 (BLADE) ×2 IMPLANT
BLADE CUDA SHAVER 3.5 (BLADE) IMPLANT
BNDG CMPR 9X6 STRL LF SNTH (GAUZE/BANDAGES/DRESSINGS) ×1
BNDG ESMARK 6X9 LF (GAUZE/BANDAGES/DRESSINGS) ×2
BNDG GAUZE ELAST 4 BULKY (GAUZE/BANDAGES/DRESSINGS) ×3 IMPLANT
CANISTER SUCT LVC 12 LTR MEDI- (MISCELLANEOUS) ×8 IMPLANT
CANISTER SUCTION 1200CC (MISCELLANEOUS) ×2 IMPLANT
CLOTH BEACON ORANGE TIMEOUT ST (SAFETY) ×2 IMPLANT
CUFF TOURNIQUET SINGLE 34IN LL (TOURNIQUET CUFF) ×2 IMPLANT
DRAPE ARTHROSCOPY W/POUCH 114 (DRAPES) ×2 IMPLANT
DRAPE INCISE 23X17 IOBAN STRL (DRAPES) ×1
DRAPE INCISE 23X17 STRL (DRAPES) ×1 IMPLANT
DRAPE INCISE IOBAN 23X17 STRL (DRAPES) ×1 IMPLANT
DRAPE INCISE IOBAN 66X45 STRL (DRAPES) ×2 IMPLANT
DRSG PAD ABDOMINAL 8X10 ST (GAUZE/BANDAGES/DRESSINGS) ×1 IMPLANT
DURAPREP 26ML APPLICATOR (WOUND CARE) ×2 IMPLANT
ELECT MENISCUS 165MM 90D (ELECTRODE) IMPLANT
ELECT REM PT RETURN 9FT ADLT (ELECTROSURGICAL)
ELECTRODE REM PT RTRN 9FT ADLT (ELECTROSURGICAL) IMPLANT
GAUZE XEROFORM 1X8 LF (GAUZE/BANDAGES/DRESSINGS) ×2 IMPLANT
GLOVE BIO SURGEON STRL SZ 6.5 (GLOVE) ×2 IMPLANT
GLOVE BIO SURGEON STRL SZ7.5 (GLOVE) ×2 IMPLANT
GLOVE BIOGEL PI IND STRL 6.5 (GLOVE) IMPLANT
GLOVE BIOGEL PI IND STRL 7.5 (GLOVE) IMPLANT
GLOVE BIOGEL PI INDICATOR 6.5 (GLOVE) ×1
GLOVE BIOGEL PI INDICATOR 7.5 (GLOVE) ×1
GLOVE INDICATOR 8.0 STRL GRN (GLOVE) ×4 IMPLANT
GLOVE SURG ORTHO 8.0 STRL STRW (GLOVE) ×2 IMPLANT
GLOVE SURG SS PI 7.5 STRL IVOR (GLOVE) ×2 IMPLANT
GOWN PREVENTION PLUS LG XLONG (DISPOSABLE) IMPLANT
GOWN STRL REIN XL XLG (GOWN DISPOSABLE) ×1 IMPLANT
GOWN STRL REUS W/TWL LRG LVL3 (GOWN DISPOSABLE) ×2 IMPLANT
GOWN STRL REUS W/TWL XL LVL3 (GOWN DISPOSABLE) ×6 IMPLANT
IMMOBILIZER KNEE 22 UNIV (SOFTGOODS) IMPLANT
IMMOBILIZER KNEE 24 THIGH 36 (MISCELLANEOUS) IMPLANT
IMMOBILIZER KNEE 24 UNIV (MISCELLANEOUS)
IV NS IRRIG 3000ML ARTHROMATIC (IV SOLUTION) ×6 IMPLANT
KNEE WRAP E Z 3 GEL PACK (MISCELLANEOUS) ×2 IMPLANT
MINI VAC (SURGICAL WAND) ×2 IMPLANT
NEEDLE HYPO 22GX1.5 SAFETY (NEEDLE) ×2 IMPLANT
PACK ARTHROSCOPY DSU (CUSTOM PROCEDURE TRAY) ×2 IMPLANT
PACK BASIN DAY SURGERY FS (CUSTOM PROCEDURE TRAY) ×2 IMPLANT
PAD ABD 8X10 STRL (GAUZE/BANDAGES/DRESSINGS) ×4 IMPLANT
PADDING CAST ABS 4INX4YD NS (CAST SUPPLIES) ×1
PADDING CAST ABS COTTON 4X4 ST (CAST SUPPLIES) ×1 IMPLANT
PENCIL BUTTON HOLSTER BLD 10FT (ELECTRODE) IMPLANT
SET ARTHROSCOPY TUBING (MISCELLANEOUS) ×2
SET ARTHROSCOPY TUBING LN (MISCELLANEOUS) ×1 IMPLANT
SPONGE GAUZE 4X4 12PLY (GAUZE/BANDAGES/DRESSINGS) ×2 IMPLANT
SPONGE GAUZE 4X4 12PLY STER LF (GAUZE/BANDAGES/DRESSINGS) ×2 IMPLANT
SUT ETHILON 4 0 PS 2 18 (SUTURE) ×2 IMPLANT
SYR CONTROL 10ML LL (SYRINGE) ×2 IMPLANT
TOWEL OR 17X24 6PK STRL BLUE (TOWEL DISPOSABLE) ×2 IMPLANT
TUBE CONNECTING 12X1/4 (SUCTIONS) ×2 IMPLANT
WAND 30 DEG SABER W/CORD (SURGICAL WAND) IMPLANT
WAND 90 DEG TURBOVAC W/CORD (SURGICAL WAND) IMPLANT
WATER STERILE IRR 500ML POUR (IV SOLUTION) ×2 IMPLANT

## 2014-03-26 NOTE — Op Note (Signed)
Dictated  [phone] [phone]

## 2014-03-26 NOTE — Discharge Instructions (Signed)
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
## 2014-03-26 NOTE — Anesthesia Postprocedure Evaluation (Signed)
Anesthesia Post Note  Patient: [name]  Procedure(s) Performed: Procedure(s) (LRB): CHONDROPLASTY (Left) ARTHROSCOPY KNEE (Left) KNEE ARTHROSCOPY WITH LATERAL MENISECTOMY (Left)  Anesthesia type: General  Patient location: PACU  Post pain: Pain level controlled  Post assessment: Post-op Vital signs reviewed  Last Vitals: BP 130/65  Pulse 66  Temp(Src) 36.5 C (Oral)  Resp 18  Wt 157 lb 8 oz (71.442 kg)  SpO2 99%  Post vital signs: Reviewed  Level of consciousness: sedated  Complications: No apparent anesthesia complications

## 2014-03-26 NOTE — Transfer of Care (Signed)
Immediate Anesthesia Transfer of Care Note  Patient: [name]  Procedure(s) Performed: Procedure(s) (LRB): CHONDROPLASTY (Left) ARTHROSCOPY KNEE (Left) KNEE ARTHROSCOPY WITH LATERAL MENISECTOMY (Left)  Patient Location: PACU  Anesthesia Type: General  Level of Consciousness: awake, oriented, sedated and patient cooperative  Airway & Oxygen Therapy: Patient Spontanous Breathing and Patient connected to face mask oxygen  Post-op Assessment: Report given to PACU RN and Post -op Vital signs reviewed and stable  Post vital signs: Reviewed and stable  Complications: No apparent anesthesia complications

## 2014-03-26 NOTE — Anesthesia Procedure Notes (Signed)
Procedure Name: LMA Insertion Date/Time: 03/26/2014 3:57 PM Performed by: Denna Haggard D Pre-anesthesia Checklist: Patient identified, Emergency Drugs available, Suction available and Patient being monitored Patient Re-evaluated:Patient Re-evaluated prior to inductionOxygen Delivery Method: Circle System Utilized Preoxygenation: Pre-oxygenation with 100% oxygen Intubation Type: IV induction Ventilation: Mask ventilation without difficulty LMA: LMA inserted LMA Size: 3.0 Number of attempts: 1 Airway Equipment and Method: bite block Placement Confirmation: positive ETCO2 Tube secured with: Tape Dental Injury: Teeth and Oropharynx as per pre-operative assessment

## 2014-03-27 ENCOUNTER — Encounter (HOSPITAL_BASED_OUTPATIENT_CLINIC_OR_DEPARTMENT_OTHER): Payer: Self-pay | Admitting: Specialist

## 2014-03-28 NOTE — Op Note (Signed)
[name], [name]                 ACCOUNT NO.:  [id number]  MEDICAL RECORD NO.:  [id number]  LOCATION:                                 FACILITY:  PHYSICIAN:  Cynda Familia, M.D.DATE OF BIRTH:  01-19-1956  DATE OF PROCEDURE:  03/26/2014 DATE OF DISCHARGE:  03/26/2014                              OPERATIVE REPORT   PREOPERATIVE DIAGNOSIS:  Left knee, torn lateral meniscus, osteoarthritis.  POSTOPERATIVE DIAGNOSIS:  Left knee, radial tear lateral meniscus, osteoarthritis grade 4 extensive patellofemoral joint and grade 4 chondral defect, medial and lateral femoral condyles.  PROCEDURE: 1. Left knee arthroscopic partial and lateral meniscectomy. 2. Chondroplasty of medial femoral condyle and chondroplasty of     lateral femoral condyle.  SURGEON:  Cynda Familia, MD  ASSISTANT:  Wyatt Portela, PA-C  ANESTHESIA:  General with intraoperative knee block.  ESTIMATED BLOOD LOSS:  Minimal.  DRAINS:  None.  COMPLICATIONS:  None.  TOURNIQUET TIME:  Twenty minutes at 250 mmHg.  DISPOSITION:  PACU, stable.  OPERATIVE DETAILS:  The patient and family were counseled in holding area.  Correct site was identified, marked and signed appropriately.  IV antibiotics were given on the way to the operating room, placed in supine position, general anesthesia.  Left thigh was placed in the thigh holder, prepped with DuraPrep and draped in sterile fashion.  Time-out was done, confirmed the left side.  Exsanguinated with an Esmarch. Tourniquet was inflated to 250 mmHg.  An arthroscopic portal was established, proximal medial, inferomedial inferolateral.  Diagnostic arthroscopy revealed extensive chondral loss of entire patellofemoral joint __________ including the patella and femoral trochlea.  __________ medial and lateral gutters were unremarkable.  ACL, PCL were intact. Lateral side inspected.  Small radial tears of lateral meniscus was gently smoothed down __________  system.  There was also chondral defect, grade 4 __________ femoral condyle, unstable flaps __________ plateau. __________ inspected, grade 4 chondromalacia defect in the medial femoral condyle, __________ base.  Extensive __________ grade 3. __________ meniscus was intact.  Median tibial plateau was unremarkable. The medial and lateral gutters__________ unremarkable.  Knee was irrigated __________ removed __________ 20 mL of __________ placed in skin, 10 mL of 0.25 Sensorcaine and __________ morphine sulfate was injected in the joint for knee block.  __________ TED hose, ice pack, no complications.  She was awakened, taken from the operating room to PACU in stable condition.  She will be stabilized in PACU __________.          ______________________________ Cynda Familia, M.D.     RAC/MEDQ  D:  03/26/2014  T:  03/27/2014  Job:  [id number]

## 2014-04-12 ENCOUNTER — Ambulatory Visit: Payer: Medicare Other | Admitting: Family Medicine

## 2014-04-17 ENCOUNTER — Encounter: Payer: Self-pay | Admitting: Physician Assistant

## 2014-04-17 ENCOUNTER — Ambulatory Visit (INDEPENDENT_AMBULATORY_CARE_PROVIDER_SITE_OTHER): Payer: Medicare Other | Admitting: Physician Assistant

## 2014-04-17 VITALS — BP 126/68 | HR 79 | Temp 98.6°F | Resp 16 | Ht 65.0 in | Wt 159.4 lb

## 2014-04-17 DIAGNOSIS — H6983 Other specified disorders of Eustachian tube, bilateral: Secondary | ICD-10-CM

## 2014-04-17 DIAGNOSIS — J069 Acute upper respiratory infection, unspecified: Secondary | ICD-10-CM

## 2014-04-17 MED ORDER — METHYLPREDNISOLONE (PAK) 4 MG PO TABS: ORAL_TABLET | ORAL | Status: DC

## 2014-04-17 NOTE — Progress Notes (Signed)
Pre visit review using our clinic review tool, if applicable. No additional management support is needed unless otherwise documented below in the visit note/SLS  

## 2014-04-17 NOTE — Patient Instructions (Signed)
Take steroid dose pack as directed.  Take a daily claritin.  Increase fluid intake.  Rest.  Saline nasal spray.  Take a daily probiotic.  Continue other medications as directed.  Follow-up if no symptom improvement.
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
## 2014-04-18 DIAGNOSIS — J069 Acute upper respiratory infection, unspecified: Secondary | ICD-10-CM | POA: Insufficient documentation

## 2014-04-18 DIAGNOSIS — H6980 Other specified disorders of Eustachian tube, unspecified ear: Secondary | ICD-10-CM | POA: Insufficient documentation

## 2014-04-18 NOTE — Assessment & Plan Note (Signed)
Increase fluids.  Rest.  Saline nasal spray. Mucinex.  Humidifier in bedroom.

## 2014-04-18 NOTE — Assessment & Plan Note (Signed)
Rx medrol dose pack.  Daily claritin.

## 2014-04-19 ENCOUNTER — Ambulatory Visit: Payer: Medicare Other | Admitting: Physician Assistant

## 2014-04-22 ENCOUNTER — Encounter: Payer: Self-pay | Admitting: Cardiology

## 2014-04-22 ENCOUNTER — Ambulatory Visit (INDEPENDENT_AMBULATORY_CARE_PROVIDER_SITE_OTHER): Payer: Medicare Other | Admitting: Cardiology

## 2014-04-22 VITALS — BP 110/70 | HR 81 | Ht 65.5 in | Wt 159.0 lb

## 2014-04-22 DIAGNOSIS — T451X5A Adverse effect of antineoplastic and immunosuppressive drugs, initial encounter: Secondary | ICD-10-CM

## 2014-04-22 DIAGNOSIS — I427 Cardiomyopathy due to drug and external agent: Secondary | ICD-10-CM

## 2014-04-22 DIAGNOSIS — R0602 Shortness of breath: Secondary | ICD-10-CM

## 2014-04-22 DIAGNOSIS — Q249 Congenital malformation of heart, unspecified: Secondary | ICD-10-CM

## 2014-04-22 DIAGNOSIS — I2510 Atherosclerotic heart disease of native coronary artery without angina pectoris: Secondary | ICD-10-CM

## 2014-04-22 NOTE — Patient Instructions (Signed)
Your physician recommends that you schedule a follow-up appointment in: one year with Dr. Hochrein  We are ordering labs for you to get done  
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
## 2014-04-23 LAB — BRAIN NATRIURETIC PEPTIDE: Brain Natriuretic Peptide: 10.5 pg/mL (ref 0.0–100.0)

## 2014-04-25 ENCOUNTER — Encounter: Payer: Self-pay | Admitting: Medical

## 2014-04-25 ENCOUNTER — Ambulatory Visit (INDEPENDENT_AMBULATORY_CARE_PROVIDER_SITE_OTHER): Payer: Medicare Other | Admitting: Medical

## 2014-04-25 ENCOUNTER — Ambulatory Visit: Payer: Medicare Other | Admitting: Medical

## 2014-04-25 VITALS — BP 139/77 | HR 84 | Temp 98.3°F | Ht 65.5 in | Wt 162.6 lb

## 2014-04-25 DIAGNOSIS — H6981 Other specified disorders of Eustachian tube, right ear: Secondary | ICD-10-CM

## 2014-04-25 DIAGNOSIS — H6091 Unspecified otitis externa, right ear: Secondary | ICD-10-CM

## 2014-04-25 DIAGNOSIS — H9203 Otalgia, bilateral: Secondary | ICD-10-CM

## 2014-04-25 MED ORDER — METHYLPREDNISOLONE ACETATE 80 MG/ML IJ SUSP
40.0000 mg | Freq: Once | INTRAMUSCULAR | Status: AC
  Administered 2014-04-25: 40 mg via INTRAMUSCULAR

## 2014-04-25 MED ORDER — NEOMYCIN-POLYMYXIN-HC 3.5-10000-1 OT SOLN: 3.0000 [drp] | Freq: Four times a day (QID) | OTIC | Status: DC

## 2014-04-25 MED ORDER — AZITHROMYCIN 250 MG PO TABS: ORAL_TABLET | ORAL | Status: DC

## 2014-04-25 NOTE — Assessment & Plan Note (Signed)
Pt does have tragal tenderness. Pt states that ENT mentioned that in near future if she did not get better he would consider giving steroid drop. So did rx cortisporin otic. Pt will call us on Monday to give update.

## 2014-04-25 NOTE — Progress Notes (Signed)
Pre visit review using our clinic review tool, if applicable. No additional management support is needed unless otherwise documented below in the visit note. 

## 2014-04-25 NOTE — Patient Instructions (Addendum)
For your ear pressure, you may have some otitis externa based on your tragal tenderness. I am prescribing cortisporin otic drops.  Also I am considering dx of  eustachian tube dysfunction as prior provider thought(as well as possible underlying allergic rhinitis). We discussed brief course of prednisone vs depo-medrol im injection. We decided to use the depo-medrol injection. Also continue your steroid nasal spray.  Call me on Monday am with update. If your symptoms persist we will refer you back to your ENT.   Regarding pt brief mention of mild st. I did not work up this up as this was mild and not suspicious for strep. And concentrated on making sure her ear drops were affordable.(called her pharmacy and talked with them about drops, got distracted from mild st.) I will send antibiotic to her pharmacy and she can take if her throat worsens. I tried to call her at home and mobile but unable to reach her.
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
## 2014-04-25 NOTE — Assessment & Plan Note (Signed)
After discussion with pt, decided to give her depo-medrol im. Pt will update me on Monday how she feels. Note sneezing past 2 wks. May have allergy component.

## 2014-04-26 ENCOUNTER — Telehealth: Payer: Self-pay | Admitting: Family Medicine

## 2014-04-26 NOTE — Telephone Encounter (Signed)
Caller name: Pieper Relation to pt: Call back number:[phone] Reason for call:  Pt received call at 8:30 pm last night; have Percell Miller call pt if he called.

## 2014-04-29 ENCOUNTER — Ambulatory Visit (HOSPITAL_COMMUNITY)
Admission: RE | Admit: 2014-04-29 | Discharge: 2014-04-29 | Disposition: A | Discharge status: Home / Self Care | Payer: Medicare Other | Source: Ambulatory Visit | Attending: Cardiology | Admitting: Cardiology

## 2014-04-29 ENCOUNTER — Emergency Department (HOSPITAL_COMMUNITY): Payer: Medicare Other

## 2014-04-29 ENCOUNTER — Telehealth: Payer: Self-pay | Admitting: Cardiology

## 2014-04-29 ENCOUNTER — Other Ambulatory Visit: Payer: Self-pay

## 2014-04-29 ENCOUNTER — Emergency Department (HOSPITAL_COMMUNITY)
Admission: EM | Admit: 2014-04-29 | Discharge: 2014-04-29 | Disposition: A | Discharge status: Home / Self Care | Payer: Medicare Other | Attending: Emergency Medicine | Admitting: Emergency Medicine

## 2014-04-29 ENCOUNTER — Encounter (HOSPITAL_COMMUNITY): Payer: Self-pay | Admitting: Adult Health

## 2014-04-29 DIAGNOSIS — R0602 Shortness of breath: Secondary | ICD-10-CM

## 2014-04-29 DIAGNOSIS — I519 Heart disease, unspecified: Secondary | ICD-10-CM

## 2014-04-29 DIAGNOSIS — R0609 Other forms of dyspnea: Secondary | ICD-10-CM | POA: Diagnosis not present

## 2014-04-29 DIAGNOSIS — R0600 Dyspnea, unspecified: Secondary | ICD-10-CM | POA: Insufficient documentation

## 2014-04-29 DIAGNOSIS — I428 Other cardiomyopathies: Secondary | ICD-10-CM | POA: Diagnosis not present

## 2014-04-29 DIAGNOSIS — Z8709 Personal history of other diseases of the respiratory system: Secondary | ICD-10-CM | POA: Insufficient documentation

## 2014-04-29 DIAGNOSIS — Z79899 Other long term (current) drug therapy: Secondary | ICD-10-CM | POA: Insufficient documentation

## 2014-04-29 DIAGNOSIS — M1990 Unspecified osteoarthritis, unspecified site: Secondary | ICD-10-CM | POA: Diagnosis not present

## 2014-04-29 DIAGNOSIS — F419 Anxiety disorder, unspecified: Secondary | ICD-10-CM | POA: Diagnosis not present

## 2014-04-29 DIAGNOSIS — F329 Major depressive disorder, single episode, unspecified: Secondary | ICD-10-CM | POA: Diagnosis not present

## 2014-04-29 DIAGNOSIS — Z87828 Personal history of other (healed) physical injury and trauma: Secondary | ICD-10-CM | POA: Insufficient documentation

## 2014-04-29 DIAGNOSIS — Z853 Personal history of malignant neoplasm of breast: Secondary | ICD-10-CM | POA: Diagnosis not present

## 2014-04-29 DIAGNOSIS — I2510 Atherosclerotic heart disease of native coronary artery without angina pectoris: Secondary | ICD-10-CM | POA: Diagnosis not present

## 2014-04-29 DIAGNOSIS — Z86010 Personal history of colonic polyps: Secondary | ICD-10-CM | POA: Insufficient documentation

## 2014-04-29 DIAGNOSIS — Z9889 Other specified postprocedural states: Secondary | ICD-10-CM | POA: Insufficient documentation

## 2014-04-29 DIAGNOSIS — E039 Hypothyroidism, unspecified: Secondary | ICD-10-CM | POA: Insufficient documentation

## 2014-04-29 DIAGNOSIS — Z8673 Personal history of transient ischemic attack (TIA), and cerebral infarction without residual deficits: Secondary | ICD-10-CM | POA: Diagnosis not present

## 2014-04-29 DIAGNOSIS — K589 Irritable bowel syndrome without diarrhea: Secondary | ICD-10-CM | POA: Insufficient documentation

## 2014-04-29 DIAGNOSIS — R609 Edema, unspecified: Secondary | ICD-10-CM | POA: Insufficient documentation

## 2014-04-29 LAB — I-STAT TROPONIN, ED: Troponin i, poc: 0.04 ng/mL (ref 0.00–0.08)

## 2014-04-29 LAB — BASIC METABOLIC PANEL
Anion gap: 14 (ref 5–15)
BUN: 20 mg/dL (ref 6–23)
CO2: 25 mEq/L (ref 19–32)
Calcium: 8.7 mg/dL (ref 8.4–10.5)
Chloride: 103 mEq/L (ref 96–112)
Creatinine, Ser: 1.01 mg/dL (ref 0.50–1.10)
GFR calc Af Amer: 70 mL/min — ABNORMAL LOW (ref >90)
GFR calc non Af Amer: 61 mL/min — ABNORMAL LOW (ref >90)
Glucose, Bld: 94 mg/dL (ref 70–99)
Potassium: 4 mEq/L (ref 3.7–5.3)
Sodium: 142 mEq/L (ref 137–147)

## 2014-04-29 LAB — CBC
HCT: 40.9 % (ref 36.0–46.0)
Hemoglobin: 13.4 g/dL (ref 12.0–15.0)
MCH: 31.3 pg (ref 26.0–34.0)
MCHC: 32.8 g/dL (ref 30.0–36.0)
MCV: 95.6 fL (ref 78.0–100.0)
Platelets: 226 10*3/uL (ref 150–400)
RBC: 4.28 MIL/uL (ref 3.87–5.11)
RDW: 12.7 % (ref 11.5–15.5)
WBC: 8.2 10*3/uL (ref 4.0–10.5)

## 2014-04-29 LAB — PRO B NATRIURETIC PEPTIDE: Pro B Natriuretic peptide (BNP): 151 pg/mL — ABNORMAL HIGH (ref 0–125)

## 2014-04-29 MED ORDER — IOHEXOL 350 MG/ML SOLN
80.0000 mL | Freq: Once | INTRAVENOUS | Status: AC | PRN
  Administered 2014-04-29: 73 mL via INTRAVENOUS

## 2014-04-29 NOTE — Progress Notes (Addendum)
2D Echocardiogram Complete.  04/29/2014   Deliah Boston, RDCS   Preliminary Technician Findings:  The Right Ventricle and outflow tract displays hyperdynamic function and mild Tricuspid/Pulmonic Regurgitation.  The IVC is not dilated, but does not collapse with the sniff maneuver, consistant with a pressure overloaded state.  These findings were reviewed with Dr. Debara Pickett before the patient we released.

## 2014-04-29 NOTE — ED Notes (Signed)
EKG completed in triage.

## 2014-04-29 NOTE — ED Notes (Signed)
Presents with 4 weeks of SOB, worse with mininmal exertion associated with chest tightness and "feeling like I am going to pass out" had knee surgery 4 weeks ago and went to [facility] today had an Echo there and was told to come here to rule out PE. Not taking blood thinners.

## 2014-04-29 NOTE — Discharge Instructions (Signed)
Not every illness or injury can be identified during an emergency department visit, thus follow-up with your primary healthcare provider is important. Medical conditions can also worsen, so it is also important to return immediately as directed below, or if you have other serious concerns develop. °RETURN IMMEDIATELY IF you develop new shortness of breath, chest pain, fever, have difficulty moving parts of your body (new weakness, numbness, or incoordination), sudden change in speech, vision, swallowing, or understanding, faint or develop new dizziness, severe headache, become poorly responsive or have an altered mental status compared to baseline for you, new rash, abdominal pain, or bloody stools,  °Return sooner also if you develop new problems for which you have not talked to your caregiver but you feel may be emergency medical conditions, or are unable to be cared for safely at home. °

## 2014-04-29 NOTE — Telephone Encounter (Signed)
Patient called Dr.Hochrein spoke to [facility] today he wants you to have a echo today if possible and to see him in office tomorrow 04/30/14.Schedulers will call back to schedule.
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
## 2014-04-30 ENCOUNTER — Ambulatory Visit (INDEPENDENT_AMBULATORY_CARE_PROVIDER_SITE_OTHER): Payer: Medicare Other | Admitting: Cardiology

## 2014-04-30 ENCOUNTER — Encounter: Payer: Self-pay | Admitting: Cardiology

## 2014-04-30 ENCOUNTER — Encounter: Payer: Self-pay | Admitting: *Deleted

## 2014-04-30 VITALS — BP 122/78 | HR 86 | Ht 65.0 in | Wt 160.0 lb

## 2014-04-30 DIAGNOSIS — I427 Cardiomyopathy due to drug and external agent: Secondary | ICD-10-CM

## 2014-04-30 DIAGNOSIS — T451X5A Adverse effect of antineoplastic and immunosuppressive drugs, initial encounter: Secondary | ICD-10-CM

## 2014-04-30 DIAGNOSIS — R0600 Dyspnea, unspecified: Secondary | ICD-10-CM

## 2014-04-30 NOTE — Progress Notes (Signed)
Leave a message for patient letting her know that her blood work was normal.
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
## 2014-04-30 NOTE — Patient Instructions (Signed)
Your physician recommends that you schedule a follow-up appointment in: [facility] physician has requested that you have a lexiscan myoview. For further information please visit [email or web address]. Please follow instruction sheet, as given.   TAKE FUROSEMIDE DAILY FOR THE NEXT 3 DAYS

## 2014-05-01 ENCOUNTER — Telehealth (HOSPITAL_COMMUNITY): Payer: Self-pay

## 2014-05-01 NOTE — Telephone Encounter (Signed)
Encounter complete. 

## 2014-05-02 ENCOUNTER — Ambulatory Visit (HOSPITAL_COMMUNITY)
Admission: RE | Admit: 2014-05-02 | Discharge: 2014-05-02 | Disposition: A | Discharge status: Home / Self Care | Payer: Medicare Other | Source: Ambulatory Visit | Attending: Cardiology | Admitting: Cardiology

## 2014-05-02 ENCOUNTER — Telehealth: Payer: Self-pay | Admitting: Physician Assistant

## 2014-05-02 DIAGNOSIS — R0609 Other forms of dyspnea: Secondary | ICD-10-CM | POA: Diagnosis not present

## 2014-05-02 DIAGNOSIS — R079 Chest pain, unspecified: Secondary | ICD-10-CM | POA: Insufficient documentation

## 2014-05-02 DIAGNOSIS — I998 Other disorder of circulatory system: Secondary | ICD-10-CM | POA: Diagnosis present

## 2014-05-02 DIAGNOSIS — Z8249 Family history of ischemic heart disease and other diseases of the circulatory system: Secondary | ICD-10-CM | POA: Insufficient documentation

## 2014-05-02 DIAGNOSIS — R42 Dizziness and giddiness: Secondary | ICD-10-CM | POA: Insufficient documentation

## 2014-05-02 DIAGNOSIS — R0602 Shortness of breath: Secondary | ICD-10-CM | POA: Insufficient documentation

## 2014-05-02 DIAGNOSIS — R0600 Dyspnea, unspecified: Secondary | ICD-10-CM

## 2014-05-02 DIAGNOSIS — Z9011 Acquired absence of right breast and nipple: Secondary | ICD-10-CM | POA: Insufficient documentation

## 2014-05-02 DIAGNOSIS — R5383 Other fatigue: Secondary | ICD-10-CM | POA: Diagnosis not present

## 2014-05-02 DIAGNOSIS — I427 Cardiomyopathy due to drug and external agent: Secondary | ICD-10-CM

## 2014-05-02 DIAGNOSIS — T451X5A Adverse effect of antineoplastic and immunosuppressive drugs, initial encounter: Secondary | ICD-10-CM

## 2014-05-02 MED ORDER — AMINOPHYLLINE 25 MG/ML IV SOLN
75.0000 mg | Freq: Once | INTRAVENOUS | Status: AC
  Administered 2014-05-02: 75 mg via INTRAVENOUS

## 2014-05-02 MED ORDER — TECHNETIUM TC 99M SESTAMIBI GENERIC - CARDIOLITE
30.0000 | Freq: Once | INTRAVENOUS | Status: AC | PRN
  Administered 2014-05-02: 30 via INTRAVENOUS

## 2014-05-02 MED ORDER — TECHNETIUM TC 99M SESTAMIBI GENERIC - CARDIOLITE
10.0000 | Freq: Once | INTRAVENOUS | Status: AC | PRN
  Administered 2014-05-02: 10 via INTRAVENOUS

## 2014-05-02 MED ORDER — REGADENOSON 0.4 MG/5ML IV SOLN
0.4000 mg | Freq: Once | INTRAVENOUS | Status: AC
  Administered 2014-05-02: 0.4 mg via INTRAVENOUS

## 2014-05-02 NOTE — Telephone Encounter (Signed)
The patient called concerned that her HR was 118 and was a little SOB.  She said she does have a history of anxiety and takes xanax QHS PRN.  i recommended she take a dose.  I gave her the stress test results which made her happy.  Amberlyn Martinezgarcia, PAC

## 2014-05-02 NOTE — Procedures (Addendum)
[place] [address] [place] [address] [phone]  Cardiology Nuclear Med Study  [name] is a 58 y.o. female     MRN : [id number]     DOB: April 20, 1956  Procedure Date: 05/02/2014  Nuclear Med Background Indication for Stress Test:  Evaluation for Ischemia and [facility] History:  CAD;NICM;Congenital anomoly of superior vena cava-coronary sinus w/no evidence of ASD;Last NUC MPI on 04/09/2008-nonischemic;EF=62%ECHO on 04/29/2014-EF=50-55% Cardiac Risk Factors: Family History - CAD  Symptoms:  Chest Pain, Dizziness, DOE, Fatigue, Light-Headedness and SOB   Nuclear Pre-Procedure Caffeine/Decaff Intake:  7:00pm NPO After: 5:00am   IV Site: L Forearm  IV 0.9% NS with Angio Cath:  22g  Chest Size (in):  n/a IV Started by: Rolene Course, RN  Height: 5\' 5"  (1.651 m)  Cup Size: C;Pt is s/p Right Mastectomy/lymphectomy;NO BP OR STICKS IN RIGHT ARM  BMI:  Body mass index is 26.63 kg/(m^2). Weight:  160 lb (72.576 kg)   Tech Comments:  n/a    Nuclear Med Study 1 or 2 day study: 1 day  Stress Test Type:  [place] Provider:  Minus Breeding, MD   Resting Radionuclide: Technetium 64m Sestamibi  Resting Radionuclide Dose: 10.3 mCi   Stress Radionuclide:  Technetium 40m Sestamibi  Stress Radionuclide Dose: 31.0 mCi           Stress Protocol Rest HR: 90 Stress HR: 125  Rest BP:135/89 Stress BP: 149/95  Exercise Time (min): n/a METS: n/a          Dose of Adenosine (mg):  n/a Dose of Lexiscan: 0.4 mg  Dose of Atropine (mg): n/a Dose of Dobutamine: n/a mcg/kg/min (at max HR)  Stress Test Technologist: Mellody Memos, CCT Nuclear Technologist: Otho Perl, CNMT   Rest Procedure:  Myocardial perfusion imaging was performed at rest 45 minutes following the intravenous administration of Technetium 71m Sestamibi. Stress Procedure:  The patient received IV Lexiscan 0.4 mg over 15-seconds.  Technetium  38m Sestamibi injected IV at 30-seconds.  Patient experienced shortness of breath, dizziness and was administered 75 mg of Aminophylline IV. There were no significant changes with Lexiscan.  Quantitative spect images were obtained after a 45 minute delay.  Transient Ischemic Dilatation (Normal <1.22):  1.08 QGS EDV:  47 ml QGS ESV:  22 ml LV Ejection Fraction: 53%        Rest ECG: NSR - Normal EKG  Stress ECG: No significant ST segment change suggestive of ischemia.  QPS Raw Data Images:  Acquisition technically good; normal left ventricular size. Stress Images:  Normal homogeneous uptake in all areas of the myocardium. Rest Images:  Normal homogeneous uptake in all areas of the myocardium. Subtraction (SDS):  No evidence of ischemia.  Impression Exercise Capacity:  Lexiscan with no exercise. BP Response:  Normal blood pressure response. Clinical Symptoms:  There is dyspnea. ECG Impression:  No significant ST segment change suggestive of ischemia. Comparison with Prior Nuclear Study: Compared to 04/09/08, no change.  Overall Impression:  Normal stress nuclear study.  LV Wall Motion:  NL LV Function; NL Wall Motion   Kirk Ruths, MD  05/02/2014 1:20 PM

## 2014-05-03 ENCOUNTER — Ambulatory Visit: Payer: Medicare Other | Admitting: Cardiology

## 2014-05-07 ENCOUNTER — Ambulatory Visit (INDEPENDENT_AMBULATORY_CARE_PROVIDER_SITE_OTHER): Payer: Medicare Other | Admitting: Family Medicine

## 2014-05-07 ENCOUNTER — Ambulatory Visit (HOSPITAL_BASED_OUTPATIENT_CLINIC_OR_DEPARTMENT_OTHER)
Admission: RE | Admit: 2014-05-07 | Discharge: 2014-05-07 | Disposition: A | Discharge status: Home / Self Care | Payer: Medicare Other | Source: Ambulatory Visit | Attending: Diagnostic Radiology | Admitting: Diagnostic Radiology

## 2014-05-07 ENCOUNTER — Ambulatory Visit: Payer: Medicare Other | Admitting: Medical

## 2014-05-07 ENCOUNTER — Encounter: Payer: Self-pay | Admitting: Family Medicine

## 2014-05-07 VITALS — BP 110/71 | HR 92 | Temp 97.5°F | Ht 65.0 in | Wt 161.4 lb

## 2014-05-07 DIAGNOSIS — J810 Acute pulmonary edema: Secondary | ICD-10-CM | POA: Diagnosis present

## 2014-05-07 DIAGNOSIS — F418 Other specified anxiety disorders: Secondary | ICD-10-CM

## 2014-05-07 DIAGNOSIS — B369 Superficial mycosis, unspecified: Secondary | ICD-10-CM

## 2014-05-07 DIAGNOSIS — I429 Cardiomyopathy, unspecified: Secondary | ICD-10-CM | POA: Insufficient documentation

## 2014-05-07 DIAGNOSIS — R600 Localized edema: Secondary | ICD-10-CM | POA: Diagnosis not present

## 2014-05-07 DIAGNOSIS — H6240 Otitis externa in other diseases classified elsewhere, unspecified ear: Secondary | ICD-10-CM

## 2014-05-07 DIAGNOSIS — N179 Acute kidney failure, unspecified: Secondary | ICD-10-CM

## 2014-05-07 DIAGNOSIS — Z9481 Bone marrow transplant status: Secondary | ICD-10-CM | POA: Diagnosis not present

## 2014-05-07 DIAGNOSIS — I2510 Atherosclerotic heart disease of native coronary artery without angina pectoris: Secondary | ICD-10-CM | POA: Diagnosis not present

## 2014-05-07 DIAGNOSIS — R0602 Shortness of breath: Secondary | ICD-10-CM

## 2014-05-07 DIAGNOSIS — K219 Gastro-esophageal reflux disease without esophagitis: Secondary | ICD-10-CM

## 2014-05-07 DIAGNOSIS — Z853 Personal history of malignant neoplasm of breast: Secondary | ICD-10-CM | POA: Diagnosis not present

## 2014-05-07 DIAGNOSIS — B49 Unspecified mycosis: Secondary | ICD-10-CM

## 2014-05-07 MED ORDER — FUROSEMIDE 40 MG PO TABS: 20.0000 mg | ORAL_TABLET | Freq: Every day | ORAL | Status: DC | PRN

## 2014-05-07 MED ORDER — METOLAZONE 2.5 MG PO TABS: 2.5000 mg | ORAL_TABLET | Freq: Every day | ORAL | Status: DC | PRN
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
## 2014-05-07 NOTE — Patient Instructions (Signed)
Pulmonary Edema ° °Pulmonary edema (PE) is a condition in which fluid collects in the lungs. This makes it hard to breathe. PE may be a result of the heart not pumping very well or a result of injury.  °CAUSES  °· Coronary artery disease causes blockages in the arteries of the heart. This deprives the heart muscle of oxygen and weakens the muscle. A heart attack is a form of coronary artery disease. °· High blood pressure causes the heart muscle to work harder than usual. Over time, the heart muscle may get stiff, and it starts to work less efficiently. It may also fatigue and weaken. °· Viral infection of the heart (myocarditis) may weaken the heart muscle. °· Metabolic conditions such as thyroid disease, excessive alcohol use, certain vitamin deficiencies, or diabetes may also weaken the heart muscle. °· Leaky or stiff heart valves may impair normal heart function. °· Lung disease may strain the heart muscle. °· Excessive demands on the heart such as too much salt or fluid intake. °· Failure to take prescribed medicines. °· Lung injury from heat or toxins, such as poisonous gas. °· Infection in the lungs or other parts of the body. °· Fluid overload caused by kidney failure or medicines. °SYMPTOMS  °· Shortness of breath at rest or with exertion. °· Grunting, wheezing, or gurgling while breathing. °· Feeling like you cannot get enough air. °· Breaths are shallow and fast. °· A lot of coughing with frothy or bloody mucus. °· Skin may become cool, damp, and turn a pale or bluish color. °DIAGNOSIS  °Initial diagnosis may be based on your history, symptoms, and a physical examination. Additional tests for PE may include: °· Electrocardiography. °· Chest X-ray. °· Blood tests. °· Stress test. °· Ultrasound evaluation of the heart (echocardiography). °· Evaluation by a heart doctor (cardiologist). °· Test of the heart arteries to look for blockages (angiography). °· Check of blood oxygen. °TREATMENT  °Treatment of PE  will depend on the underlying cause and will focus first on relieving the symptoms.  °· Extra oxygen to make breathing easier and assist with removing mucus. This may include breathing treatments or a tube into the lungs and a breathing machine. °· Medicine to help the body get rid of extra water, usually through an IV tube. °· Medicine to help the heart pump better. °· If poor heart function is the cause, treatment may include: °¨ Procedures to open blocked arteries, repair damaged heart valves, or remove some of the damaged heart muscle. °¨ A pacemaker to help the heart pump with less effort. °HOME CARE INSTRUCTIONS  °· Your health care provider will help you determine what type of exercise program may be helpful. It is important to maintain strength and increase it if possible. Pace your activities to avoid shortness of breath or chest pain. Rest for at least 1 hour before and after meals. Cardiac rehabilitation programs are available in some locations. °· Eat a heart-healthy diet low in salt, saturated fat, and cholesterol. Ask for help with choices. °· Make a list of every medicine, vitamin, or herbal supplement you are taking. Keep the list with you at all times. Show it to your health care provider at every visit and before starting a new medicine. Keep the list up to date. °· Ask your health care provider or pharmacist to help you write a plan or schedule so that you know things about each medicine such as: °¨ Why you are taking it. °¨ The possible side effects. °¨   The best time of day to take it. °¨ Foods to take with it or avoid. °¨ When to stop taking it. °· Record your hospital or clinic weight. When you get home, compare it to your scale and record your weight. Then, weigh yourself first thing in the morning daily, and record the weights. You should weigh yourself every morning after you urinate and before you eat breakfast. Wear the same amount of clothing each time you weigh yourself. Provide your  health care provider with your weight record. Daily weights are important in the early recognition of excess fluid. Tell your health care provider right away if you have gained 03 lb/1.4 kg in 1 day, 05 lb/2.3 kg in a week, or as directed by your health care provider. Your medicines may need to be adjusted. °· Blood pressure monitoring should be done as often as directed. You can get a home blood pressure cuff at your drugstore. Record these values and bring them with you for your clinic visits. Notify your health care provider if you become dizzy or light-headed when standing up. °· If you are currently a smoker, it is time to quit. Nicotine makes your heart work harder and is one of the leading causes of cardiac deaths. Do not use nicotine gum or patches before talking to your doctor. °· Make a follow-up appointment with your health care provider as directed. °· Ask your health care provider for a copy of your latest heart tracing (ECG) and keep a copy with you at all times. °SEEK IMMEDIATE MEDICAL CARE IF:  °· You have severe chest pain, especially if the pain is crushing or pressure-like and spreads to the arms, back, neck, or jaw. THIS IS AN EMERGENCY. Do not wait to see if the pain will go away. Call for local emergency medical help. Do not drive yourself to the hospital. °· You have sweating, feel sick to your stomach (nauseous), or are experiencing shortness of breath. °· Your weight increases by 03 lb/1.4 kg in 1 day or 05 lb/2.3 kg in a week. °· You notice increasing shortness of breath that is unusual for you. This may happen during rest, sleep, or with activity. °· You develop chest pain (angina) or pain that is unusual for you. °· You notice more swelling in your hands, feet, ankles, or abdomen. °· You notice lasting (persistent) dizziness, blurred vision, headache, or unsteadiness. °· You begin to cough up bloody mucus (sputum). °· You are unable to sleep because it is hard to breathe. °· You begin to  feel a "jumping" or "fluttering" sensation (palpitations) in the chest that is unusual for you. °MAKE SURE YOU: °· Understand these instructions. °· Will watch your condition. °· Will get help right away if you are not doing well or get worse. °Document Released: 09/04/2002 Document Revised: 06/19/2013 Document Reviewed: 02/19/2013 °Exit[employer]® Patient Information ©2015 [employer]. This information is not intended to replace advice given to you by your health care provider. Make sure you discuss any questions you have with your health care provider. ° °

## 2014-05-08 LAB — RENAL FUNCTION PANEL
Albumin: 3.4 g/dL — ABNORMAL LOW (ref 3.5–5.2)
BUN: 22 mg/dL (ref 6–23)
CO2: 30 mEq/L (ref 19–32)
Calcium: 8.8 mg/dL (ref 8.4–10.5)
Chloride: 106 mEq/L (ref 96–112)
Creatinine, Ser: 0.9 mg/dL (ref 0.4–1.2)
GFR: 66.84 mL/min (ref >60.00)
Glucose, Bld: 59 mg/dL — ABNORMAL LOW (ref 70–99)
Phosphorus: 2.6 mg/dL (ref 2.3–4.6)
Potassium: 3.5 mEq/L (ref 3.5–5.1)
Sodium: 141 mEq/L (ref 135–145)

## 2014-05-08 LAB — CBC
HCT: 39.5 % (ref 36.0–46.0)
Hemoglobin: 13.1 g/dL (ref 12.0–15.0)
MCHC: 33.1 g/dL (ref 30.0–36.0)
MCV: 95.3 fl (ref 78.0–100.0)
Platelets: 249 10*3/uL (ref 150.0–400.0)
RBC: 4.15 Mil/uL (ref 3.87–5.11)
RDW: 13.1 % (ref 11.5–15.5)
WBC: 5.9 10*3/uL (ref 4.0–10.5)

## 2014-05-08 LAB — BRAIN NATRIURETIC PEPTIDE: Pro B Natriuretic peptide (BNP): 57 pg/mL (ref 0.0–100.0)

## 2014-05-09 ENCOUNTER — Telehealth: Payer: Self-pay | Admitting: Family Medicine

## 2014-05-09 DIAGNOSIS — E860 Dehydration: Secondary | ICD-10-CM

## 2014-05-09 NOTE — Telephone Encounter (Signed)
Inform patient that lab and xray results were sent to her mychart  Schedule a lab appt for pt to get her potassium rechecked. Inform her that we mailed a list of foods with potassium to here and if her potassium is low we will send her in an RX

## 2014-05-09 NOTE — Telephone Encounter (Signed)
So she can come in to have her potassium rechecked and mail her a copy of the list of foods with potassium in them. If her K is low I will send her in a prescription.

## 2014-05-09 NOTE — Telephone Encounter (Signed)
Caller name: Ninnie Relation to pt: self Call back number: [phone] Pharmacy:  Reason for call:   1. Patient calling in requesting lab and chest Xray results  2. Patient states that she cannot drink gatorade and feels depleted from this fluid pill and wanted to know what she should do?

## 2014-05-09 NOTE — Telephone Encounter (Signed)
Dr Charlett Blake please advise the 2nd question?  Please inform patient that results were sent to [facility]

## 2014-05-09 NOTE — Telephone Encounter (Signed)
Informed patient of this. Please order labs

## 2014-05-09 NOTE — Telephone Encounter (Signed)
Lab has already been ordered

## 2014-05-10 ENCOUNTER — Encounter (HOSPITAL_COMMUNITY): Payer: Self-pay | Admitting: Family Medicine

## 2014-05-10 ENCOUNTER — Ambulatory Visit: Payer: Medicare Other | Admitting: Family Medicine

## 2014-05-10 ENCOUNTER — Telehealth: Payer: Self-pay | Admitting: Family Medicine

## 2014-05-10 ENCOUNTER — Other Ambulatory Visit: Payer: Medicare Other

## 2014-05-10 ENCOUNTER — Emergency Department (HOSPITAL_COMMUNITY): Payer: Medicare Other

## 2014-05-10 ENCOUNTER — Observation Stay (HOSPITAL_COMMUNITY)
Admission: EM | Admit: 2014-05-10 | Discharge: 2014-05-11 | Disposition: A | Discharge status: Home / Self Care | Payer: Medicare Other | Attending: Internal Medicine | Admitting: Internal Medicine

## 2014-05-10 DIAGNOSIS — R0600 Dyspnea, unspecified: Principal | ICD-10-CM | POA: Diagnosis present

## 2014-05-10 DIAGNOSIS — Z79899 Other long term (current) drug therapy: Secondary | ICD-10-CM | POA: Diagnosis not present

## 2014-05-10 DIAGNOSIS — E039 Hypothyroidism, unspecified: Secondary | ICD-10-CM | POA: Diagnosis present

## 2014-05-10 DIAGNOSIS — I429 Cardiomyopathy, unspecified: Secondary | ICD-10-CM | POA: Insufficient documentation

## 2014-05-10 DIAGNOSIS — F444 Conversion disorder with motor symptom or deficit: Secondary | ICD-10-CM | POA: Insufficient documentation

## 2014-05-10 DIAGNOSIS — N179 Acute kidney failure, unspecified: Secondary | ICD-10-CM | POA: Diagnosis present

## 2014-05-10 DIAGNOSIS — R0789 Other chest pain: Secondary | ICD-10-CM | POA: Diagnosis present

## 2014-05-10 DIAGNOSIS — I2510 Atherosclerotic heart disease of native coronary artery without angina pectoris: Secondary | ICD-10-CM | POA: Diagnosis present

## 2014-05-10 DIAGNOSIS — Z853 Personal history of malignant neoplasm of breast: Secondary | ICD-10-CM | POA: Diagnosis not present

## 2014-05-10 DIAGNOSIS — K589 Irritable bowel syndrome without diarrhea: Secondary | ICD-10-CM | POA: Insufficient documentation

## 2014-05-10 DIAGNOSIS — R0602 Shortness of breath: Secondary | ICD-10-CM | POA: Diagnosis present

## 2014-05-10 DIAGNOSIS — Q269 Congenital malformation of great vein, unspecified: Secondary | ICD-10-CM | POA: Diagnosis not present

## 2014-05-10 DIAGNOSIS — Z87828 Personal history of other (healed) physical injury and trauma: Secondary | ICD-10-CM | POA: Diagnosis not present

## 2014-05-10 DIAGNOSIS — F418 Other specified anxiety disorders: Secondary | ICD-10-CM | POA: Diagnosis present

## 2014-05-10 DIAGNOSIS — R9431 Abnormal electrocardiogram [ECG] [EKG]: Secondary | ICD-10-CM | POA: Diagnosis present

## 2014-05-10 DIAGNOSIS — Z973 Presence of spectacles and contact lenses: Secondary | ICD-10-CM | POA: Insufficient documentation

## 2014-05-10 DIAGNOSIS — Z86010 Personal history of colonic polyps: Secondary | ICD-10-CM | POA: Insufficient documentation

## 2014-05-10 DIAGNOSIS — M1990 Unspecified osteoarthritis, unspecified site: Secondary | ICD-10-CM | POA: Insufficient documentation

## 2014-05-10 LAB — BASIC METABOLIC PANEL
Anion gap: 21 — ABNORMAL HIGH (ref 5–15)
BUN: 35 mg/dL — ABNORMAL HIGH (ref 6–23)
CO2: 22 mEq/L (ref 19–32)
Calcium: 9.9 mg/dL (ref 8.4–10.5)
Chloride: 94 mEq/L — ABNORMAL LOW (ref 96–112)
Creatinine, Ser: 1.25 mg/dL — ABNORMAL HIGH (ref 0.50–1.10)
GFR calc Af Amer: 54 mL/min — ABNORMAL LOW (ref >90)
GFR calc non Af Amer: 47 mL/min — ABNORMAL LOW (ref >90)
Glucose, Bld: 181 mg/dL — ABNORMAL HIGH (ref 70–99)
Potassium: 3.8 mEq/L (ref 3.7–5.3)
Sodium: 137 mEq/L (ref 137–147)

## 2014-05-10 LAB — PRO B NATRIURETIC PEPTIDE: Pro B Natriuretic peptide (BNP): 74.4 pg/mL (ref 0–125)

## 2014-05-10 LAB — TSH: TSH: 1.89 u[IU]/mL (ref 0.350–4.500)

## 2014-05-10 LAB — I-STAT TROPONIN, ED: Troponin i, poc: 0 ng/mL (ref 0.00–0.08)

## 2014-05-10 LAB — CBC
HCT: 46.7 % — ABNORMAL HIGH (ref 36.0–46.0)
Hemoglobin: 16 g/dL — ABNORMAL HIGH (ref 12.0–15.0)
MCH: 31.6 pg (ref 26.0–34.0)
MCHC: 34.3 g/dL (ref 30.0–36.0)
MCV: 92.1 fL (ref 78.0–100.0)
Platelets: 289 10*3/uL (ref 150–400)
RBC: 5.07 MIL/uL (ref 3.87–5.11)
RDW: 12.2 % (ref 11.5–15.5)
WBC: 8.6 10*3/uL (ref 4.0–10.5)

## 2014-05-10 LAB — TROPONIN I: Troponin I: <0.3 ng/mL (ref <0.30)

## 2014-05-10 MED ORDER — ACETAMINOPHEN 650 MG RE SUPP: 650.0000 mg | Freq: Four times a day (QID) | RECTAL | Status: DC | PRN

## 2014-05-10 MED ORDER — ACETAMINOPHEN 325 MG PO TABS
650.0000 mg | ORAL_TABLET | Freq: Four times a day (QID) | ORAL | Status: DC | PRN
  Administered 2014-05-10: 650 mg via ORAL
  Filled 2014-05-10: qty 2

## 2014-05-10 MED ORDER — HYDROCODONE-ACETAMINOPHEN 5-325 MG PO TABS
1.0000 | ORAL_TABLET | ORAL | Status: DC | PRN
  Administered 2014-05-10 – 2014-05-11 (×3): 1 via ORAL
  Filled 2014-05-10 (×4): qty 1

## 2014-05-10 MED ORDER — ONDANSETRON HCL 4 MG/2ML IJ SOLN: 4.0000 mg | Freq: Four times a day (QID) | INTRAMUSCULAR | Status: DC | PRN

## 2014-05-10 MED ORDER — SODIUM CHLORIDE 0.9 % IV BOLUS (SEPSIS)
2000.0000 mL | Freq: Once | INTRAVENOUS | Status: AC
  Administered 2014-05-10: 2000 mL via INTRAVENOUS

## 2014-05-10 MED ORDER — ENOXAPARIN SODIUM 40 MG/0.4ML ~~LOC~~ SOLN
40.0000 mg | SUBCUTANEOUS | Status: DC
  Administered 2014-05-10: 40 mg via SUBCUTANEOUS
  Filled 2014-05-10: qty 0.4

## 2014-05-10 MED ORDER — SODIUM CHLORIDE 0.9 % IV SOLN
INTRAVENOUS | Status: AC
  Administered 2014-05-10 – 2014-05-11 (×2): via INTRAVENOUS

## 2014-05-10 MED ORDER — DOCUSATE SODIUM 100 MG PO CAPS
100.0000 mg | ORAL_CAPSULE | Freq: Every day | ORAL | Status: DC | PRN
  Administered 2014-05-10: 100 mg via ORAL
  Filled 2014-05-10: qty 1

## 2014-05-10 MED ORDER — BUPROPION HCL ER (XL) 150 MG PO TB24
300.0000 mg | ORAL_TABLET | Freq: Every morning | ORAL | Status: DC
  Administered 2014-05-11: 300 mg via ORAL
  Filled 2014-05-10: qty 2

## 2014-05-10 MED ORDER — ONDANSETRON HCL 4 MG PO TABS: 4.0000 mg | ORAL_TABLET | Freq: Four times a day (QID) | ORAL | Status: DC | PRN

## 2014-05-10 MED ORDER — LEVOTHYROXINE SODIUM 75 MCG PO TABS
75.0000 ug | ORAL_TABLET | Freq: Every day | ORAL | Status: DC
  Administered 2014-05-11: 75 ug via ORAL
  Filled 2014-05-10: qty 1

## 2014-05-10 MED ORDER — POLYETHYLENE GLYCOL 3350 17 G PO PACK: 17.0000 g | PACK | Freq: Every day | ORAL | Status: DC | PRN

## 2014-05-10 MED ORDER — SODIUM CHLORIDE 0.9 % IJ SOLN
3.0000 mL | Freq: Two times a day (BID) | INTRAMUSCULAR | Status: DC
  Administered 2014-05-10 – 2014-05-11 (×2): 3 mL via INTRAVENOUS

## 2014-05-10 MED ORDER — ASPIRIN EC 325 MG PO TBEC
325.0000 mg | DELAYED_RELEASE_TABLET | Freq: Every day | ORAL | Status: DC
  Administered 2014-05-10 – 2014-05-11 (×2): 325 mg via ORAL
  Filled 2014-05-10 (×2): qty 1

## 2014-05-10 MED ORDER — ALPRAZOLAM 0.5 MG PO TABS
0.5000 mg | ORAL_TABLET | Freq: Three times a day (TID) | ORAL | Status: DC | PRN
  Administered 2014-05-10 – 2014-05-11 (×2): 0.5 mg via ORAL
  Filled 2014-05-10 (×2): qty 1

## 2014-05-10 NOTE — Telephone Encounter (Signed)
Spoke with Dr. Charlett Blake regarding pt's symptoms, she  has agreed to see patient today at 3:30 pm.  Pt was advised to go to ER if symptoms worsen or new symptoms develop.  Pt stated understanding and agreed.
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
## 2014-05-10 NOTE — Telephone Encounter (Signed)
Patient Information:  Caller Name: [place]  Phone: [phone]  Patient: [name]  Gender: Female  DOB: 1955/08/06  Age: 58 Years  PCP: [name])  Office Follow Up:  Does the office need to follow up with this patient?: Yes  Instructions For The Office: Could office please advise if pt can be seen at office or really needs ED/symptoms have been ongoing.   Symptoms  Reason For Call & Symptoms: Pt has been SOB x 6 weeks since her knee surgery. Pt has SOB anyways d/t pulmonary toxicity from chemotherapy in the past. She has had CXR during this time and seen her cardiologist for an ECHO. Pt was prescribed extra Lasix since Tuesday 05/07/14 for pumonary edema by Dr. Charlett Blake following an ED visit. She was also started on Zaroxolyn as an extra fluid pill. Pt is coming today for blood work at 2:30. Dr. Charlett Blake is not here today. Pt is telling RN that she also feels very weak/which is a generalized weakness which migh be related to low potassium or electrolytes which she is getting checked today. She wants to see MD at office/but disposition is ED.  Reviewed Health History In EMR: Yes  Reviewed Medications In EMR: Yes  Reviewed Allergies In EMR: Yes  Reviewed Surgeries / Procedures: Yes  Date of Onset of Symptoms: 03/26/2014  Guideline(s) Used:  Breathing Difficulty  Weakness (Generalized) and Fatigue  Disposition Per Guideline:   Go to ED Now  Reason For Disposition Reached:   Difficulty breathing  Advice Given:  N/A  Patient Refused Recommendation:  Patient Will Follow Up With Office Later  Pt wants to see a doctor at the office if possible.
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
## 2014-05-10 NOTE — ED Notes (Signed)
The pt is c/o shortness of breath for 6 weeks since she had a scope of her rt knee.  She has had a full work-up cardiac in the past week.  Hx of brerast cancer 20 years ago.  She has had chest pain jaw pain numbness and tingling all over her body and she reportsd that she cannot breathe.  Speaking in sentences without stopping for air and her room air sats are  98

## 2014-05-10 NOTE — ED Notes (Signed)
Per pt having SOB, arm pain, jaw pain, calf pain and sts recently dx with pulmonary edema.

## 2014-05-10 NOTE — ED Notes (Signed)
Admitting doctor in to see.  2000cc nss  To infuse.  Given po fluids

## 2014-05-10 NOTE — ED Notes (Signed)
Attempted to give  Report unable to take

## 2014-05-10 NOTE — Telephone Encounter (Signed)
fyi

## 2014-05-10 NOTE — ED Notes (Signed)
Report called to ray on 3w  Going to room 21

## 2014-05-10 NOTE — ED Notes (Signed)
1000cc nss first liter infused 2nd liter added to iv wo

## 2014-05-10 NOTE — Telephone Encounter (Signed)
Patient called in and cancelled appointment that was scheduled for today. Patient states that she is on her way to the ED.

## 2014-05-10 NOTE — ED Notes (Signed)
She was here last Tuesday for the same

## 2014-05-11 DIAGNOSIS — R609 Edema, unspecified: Secondary | ICD-10-CM

## 2014-05-11 DIAGNOSIS — M1990 Unspecified osteoarthritis, unspecified site: Secondary | ICD-10-CM | POA: Diagnosis not present

## 2014-05-11 DIAGNOSIS — R0600 Dyspnea, unspecified: Secondary | ICD-10-CM | POA: Diagnosis not present

## 2014-05-11 DIAGNOSIS — F418 Other specified anxiety disorders: Secondary | ICD-10-CM

## 2014-05-11 DIAGNOSIS — K589 Irritable bowel syndrome without diarrhea: Secondary | ICD-10-CM | POA: Diagnosis not present

## 2014-05-11 DIAGNOSIS — E039 Hypothyroidism, unspecified: Secondary | ICD-10-CM

## 2014-05-11 DIAGNOSIS — F444 Conversion disorder with motor symptom or deficit: Secondary | ICD-10-CM | POA: Diagnosis not present

## 2014-05-11 LAB — COMPREHENSIVE METABOLIC PANEL
ALT: 14 U/L (ref 0–35)
AST: 18 U/L (ref 0–37)
Albumin: 2.8 g/dL — ABNORMAL LOW (ref 3.5–5.2)
Alkaline Phosphatase: 81 U/L (ref 39–117)
Anion gap: 13 (ref 5–15)
BUN: 27 mg/dL — ABNORMAL HIGH (ref 6–23)
CO2: 23 mEq/L (ref 19–32)
Calcium: 7.4 mg/dL — ABNORMAL LOW (ref 8.4–10.5)
Chloride: 101 mEq/L (ref 96–112)
Creatinine, Ser: 1.16 mg/dL — ABNORMAL HIGH (ref 0.50–1.10)
GFR calc Af Amer: 59 mL/min — ABNORMAL LOW (ref >90)
GFR calc non Af Amer: 51 mL/min — ABNORMAL LOW (ref >90)
Glucose, Bld: 97 mg/dL (ref 70–99)
Potassium: 3.2 mEq/L — ABNORMAL LOW (ref 3.7–5.3)
Sodium: 137 mEq/L (ref 137–147)
Total Bilirubin: 0.4 mg/dL (ref 0.3–1.2)
Total Protein: 5.2 g/dL — ABNORMAL LOW (ref 6.0–8.3)

## 2014-05-11 LAB — TROPONIN I
Troponin I: <0.3 ng/mL (ref <0.30)
Troponin I: <0.3 ng/mL (ref <0.30)

## 2014-05-11 LAB — URINALYSIS, ROUTINE W REFLEX MICROSCOPIC
Bilirubin Urine: NEGATIVE
Glucose, UA: NEGATIVE mg/dL
Hgb urine dipstick: NEGATIVE
Ketones, ur: NEGATIVE mg/dL
Leukocytes, UA: NEGATIVE
Nitrite: NEGATIVE
Protein, ur: NEGATIVE mg/dL
Specific Gravity, Urine: 1.008 (ref 1.005–1.030)
Urobilinogen, UA: 0.2 mg/dL (ref 0.0–1.0)
pH: 6 (ref 5.0–8.0)

## 2014-05-11 LAB — HIV ANTIBODY (ROUTINE TESTING W REFLEX): HIV 1&2 Ab, 4th Generation: NONREACTIVE

## 2014-05-11 LAB — CBC
HCT: 36.6 % (ref 36.0–46.0)
Hemoglobin: 12.2 g/dL (ref 12.0–15.0)
MCH: 32.2 pg (ref 26.0–34.0)
MCHC: 33.3 g/dL (ref 30.0–36.0)
MCV: 96.6 fL (ref 78.0–100.0)
Platelets: 211 10*3/uL (ref 150–400)
RBC: 3.79 MIL/uL — ABNORMAL LOW (ref 3.87–5.11)
RDW: 12.4 % (ref 11.5–15.5)
WBC: 6.7 10*3/uL (ref 4.0–10.5)

## 2014-05-11 LAB — HEMOGLOBIN A1C
Hgb A1c MFr Bld: 6 % — ABNORMAL HIGH (ref <5.7)
Mean Plasma Glucose: 126 mg/dL — ABNORMAL HIGH (ref <117)

## 2014-05-11 LAB — MAGNESIUM: Magnesium: 1.9 mg/dL (ref 1.5–2.5)

## 2014-05-11 MED ORDER — POTASSIUM CHLORIDE CRYS ER 20 MEQ PO TBCR
60.0000 meq | EXTENDED_RELEASE_TABLET | Freq: Once | ORAL | Status: AC
  Administered 2014-05-11: 60 meq via ORAL
  Filled 2014-05-11: qty 3

## 2014-05-11 MED ORDER — INFLUENZA VAC SPLIT QUAD 0.5 ML IM SUSY: 0.5000 mL | PREFILLED_SYRINGE | INTRAMUSCULAR | Status: DC

## 2014-05-11 MED ORDER — INFLUENZA VAC SPLIT QUAD 0.5 ML IM SUSY
0.5000 mL | PREFILLED_SYRINGE | Freq: Once | INTRAMUSCULAR | Status: AC
  Administered 2014-05-11: 0.5 mL via INTRAMUSCULAR
  Filled 2014-05-11: qty 0.5

## 2014-05-11 MED ORDER — POTASSIUM CHLORIDE 10 MEQ/100ML IV SOLN
10.0000 meq | INTRAVENOUS | Status: DC
  Administered 2014-05-11: 10 meq via INTRAVENOUS
  Filled 2014-05-11 (×2): qty 100

## 2014-05-11 NOTE — Progress Notes (Signed)
[facility] PROGRESS NOTE   [name] JOA:[id number] DOB: 1956-03-19 DOA: 05/10/2014 PCP: [name], MD  HPI/Subjective: Feels much better, denies any significant complains, chest pain resolved and generalized weakness improved.  Assessment/Plan: Principal Problem:   AKI (acute kidney injury) Active Problems:   Depression with anxiety   Hypothyroidism   Nonobstructive CAD   Dyspnea   Abnormal finding on EKG   Chest pain, atypical    Acute kidney injury Likely secondary to dehydration from over diuresis. Patient had recent lower extremity edema which treated with Lasix and Zaroxolyn added 3 days ago. Presented with creatinine of 1.25, after hydration with IV fluids creatinine improved to 1.16. Check BMP in a.m.  Atypical chest pain Discussed by EDP and cardiology, appears to be very atypical for cardiac chest pain. 3 sets of cardiac enzymes and EKG negative for cardiac ischemia. Patient continued on her home dose of aspirin.  Dyspnea on exertion Unclear etiology but likely secondary to generalized weakness from dehydration. Patient reported that she has pulmonary toxicity from prior chemotherapy for breast cancer. She feels much better today.  Lower extremity edema Patient reported that she had left knee surgery about 5-6 weeks ago and had swelling since then. Swelling improved since yesterday, carotid duplex ordered.  Hyperglycemia Likely this is done after the meal, blood glucose once 181, hemoglobin A1c is 6.0.  Hypokalemia Replete orally, likely secondary to total diuresis from recovery from AKI.  Code Status: Full code Family Communication: Plan discussed with the patient. Disposition Plan: Remains inpatient   Consultants:  None  Procedures:  None  Antibiotics:  None   Objective: Filed Vitals:   05/11/14 0500  BP: 106/50  Pulse: 65  Temp: 98 F (36.7 C)  Resp: 16    Intake/Output Summary (Last 24 hours) at 05/11/14  1304 Last data filed at 05/11/14 1214  Gross per 24 hour  Intake 2233.75 ml  Output   1600 ml  Net 633.75 ml   Filed Weights   05/11/14 0500  Weight: 75.297 kg (166 lb)    Exam: General: Alert and awake, oriented x3, not in any acute distress. HEENT: anicteric sclera, pupils reactive to light and accommodation, EOMI CVS: S1-S2 clear, no murmur rubs or gallops Chest: clear to auscultation bilaterally, no wheezing, rales or rhonchi Abdomen: soft nontender, nondistended, normal bowel sounds, no organomegaly Extremities: no cyanosis, clubbing or edema noted bilaterally Neuro: Cranial nerves II-XII intact, no focal neurological deficits  Data Reviewed: Basic Metabolic Panel:  Recent Labs Lab 05/07/14 1542 05/10/14 1516 05/11/14 0125 05/11/14 0810  NA 141 137 137  --   K 3.5 3.8 3.2*  --   CL 106 94* 101  --   CO2 30 22 23   --   GLUCOSE 59* 181* 97  --   BUN 22 35* 27*  --   CREATININE 0.9 1.25* 1.16*  --   CALCIUM 8.8 9.9 7.4*  --   MG  --   --   --  1.9  PHOS 2.6  --   --   --    Liver Function Tests:  Recent Labs Lab 05/07/14 1542 05/11/14 0125  AST  --  18  ALT  --  14  ALKPHOS  --  81  BILITOT  --  0.4  PROT  --  5.2*  ALBUMIN 3.4* 2.8*   No results for input(s): LIPASE, AMYLASE in the last 168 hours. No results for input(s): AMMONIA in the last 168 hours. CBC:  Recent Labs Lab  05/07/14 1542 05/10/14 1516 05/11/14 0125  WBC 5.9 8.6 6.7  HGB 13.1 16.0* 12.2  HCT 39.5 46.7* 36.6  MCV 95.3 92.1 96.6  PLT 249.0 289 211   Cardiac Enzymes:  Recent Labs Lab 05/10/14 1957 05/11/14 0125 05/11/14 0810  TROPONINI <0.30 <0.30 <0.30   BNP (last 3 results)  Recent Labs  04/29/14 1725 05/07/14 1542 05/10/14 1516  PROBNP 151.0* 57.0 74.4   CBG: No results for input(s): GLUCAP in the last 168 hours.  Micro No results found for this or any previous visit (from the past 240 hour(s)).   Studies: Dg Chest Port 1 View  05/10/2014   CLINICAL  DATA:  Six week history of difficulty breathing  EXAM: PORTABLE CHEST - 1 VIEW  COMPARISON:  May 07, 2014  FINDINGS: There is no edema or consolidation. The heart size and pulmonary vascularity are normal. No adenopathy. No pneumothorax. There are surgical clips in the right axillary region, stable. There is a small presumed bone island in the proximal right humeral metaphysis.  IMPRESSION: No edema or consolidation.   Electronically Signed   By: Lowella Grip M.D.   On: 05/10/2014 16:20    Scheduled Meds: . aspirin EC  325 mg Oral Daily  . buPROPion  300 mg Oral q morning - 10a  . enoxaparin (LOVENOX) injection  40 mg Subcutaneous Q24H  . levothyroxine  75 mcg Oral QAC breakfast  . sodium chloride  3 mL Intravenous Q12H   Continuous Infusions:      Time spent: 35 minutes    [facility] A  [facility] Pager [phone] If 7PM-7AM, please contact night-coverage at [email or web address], password [facility] 05/11/2014, 1:04 PM  LOS: 1 day

## 2014-05-11 NOTE — Progress Notes (Signed)
UR completed 

## 2014-05-11 NOTE — Plan of Care (Signed)
Problem: Phase I Progression Outcomes Goal: Pain controlled with appropriate interventions Outcome: Completed/Met Date Met:  05/11/14 Goal: OOB as tolerated unless otherwise ordered Outcome: Completed/Met Date Met:  05/11/14 Goal: Initial discharge plan identified Outcome: Progressing Goal: Voiding-avoid urinary catheter unless indicated Outcome: Completed/Met Date Met:  05/11/14 Goal: Hemodynamically stable Outcome: Completed/Met Date Met:  05/11/14  Problem: Phase II Progression Outcomes Goal: Progress activity as tolerated unless otherwise ordered Outcome: Completed/Met Date Met:  05/11/14 Goal: Vital signs remain stable Outcome: Completed/Met Date Met:  05/11/14  Problem: Phase III Progression Outcomes Goal: Pain controlled on oral analgesia Outcome: Completed/Met Date Met:  05/11/14 Goal: Activity at appropriate level-compared to baseline (UP IN CHAIR FOR HEMODIALYSIS) Outcome: Completed/Met Date Met:  05/11/14 Goal: Voiding independently Outcome: Completed/Met Date Met:  05/11/14 Goal: Foley discontinued Outcome: Not Applicable Date Met:  20/72/18  Problem: Discharge Progression Outcomes Goal: Pain controlled with appropriate interventions Outcome: Completed/Met Date Met:  05/11/14 Goal: Hemodynamically stable Outcome: Completed/Met Date Met:  28/83/37 Goal: Complications resolved/controlled Outcome: Progressing Goal: Tolerating diet Outcome: Completed/Met Date Met:  05/11/14 Goal: Activity appropriate for discharge plan Outcome: Completed/Met Date Met:  05/11/14

## 2014-05-11 NOTE — Progress Notes (Signed)
VASCULAR LAB PRELIMINARY  PRELIMINARY  PRELIMINARY  PRELIMINARY  Bilateral lower extremity venous Dopplers completed.    Preliminary report:  There is no DVT or SVT noted in the bilateral lower extremities.   Mandie Crabbe, RVT 05/11/2014, 2:39 PM

## 2014-05-11 NOTE — Plan of Care (Signed)
Problem: Discharge Progression Outcomes Goal: Discharge plan in place and appropriate Outcome: Completed/Met Date Met:  22/63/33 Goal: Complications resolved/controlled Outcome: Completed/Met Date Met:  05/11/14

## 2014-05-11 NOTE — Discharge Summary (Signed)
Physician Discharge Summary  [name] JKD:[id number] DOB: 30-Jul-1955 DOA: 05/10/2014  PCP: Christina Homans, MD  Admit date: 05/10/2014 Discharge date: 05/11/2014  Time spent: 40 minutes  Recommendations for Outpatient Follow-up:  1. Follow-up with primary care physician within one week  Discharge Diagnoses:  Principal Problem:   AKI (acute kidney injury) Active Problems:   Depression with anxiety   Hypothyroidism   Nonobstructive CAD   Dyspnea   Abnormal finding on EKG   Chest pain, atypical   Discharge Condition: stable  Diet recommendation: heart healthy  Filed Weights   05/11/14 0500  Weight: 75.297 kg (166 lb)    History of present illness:  58 year old female with a history of NICM, hypothyroidism, anxiety,and nonobstructive CAD presented to the emergency department with three-day history of shortness of breath and one day of increasing generalized weakness. The patient had a left knee meniscectomy on 03/26/2014. Since the surgery, the patient has been complaining of dyspnea on exertion and lower extremity edema. The patient went to see her cardiologist, Dr. Percival Gordon, 04/22/2014. She was instructed to take furosemide 40 mg daily with which she has been compliant. The patient continued to have shortness of breath with intermittent chest discomfort at rest as well as on exertion. She had a CT angiogram of the chest performed on 04/29/2014 which was negative for pulmonary embolus, but suggested some mild interstitial edema. Basilar showed that she had some changes of COPD. She went to see her PCP after this, and she was started on Zaroxolyn in addition to the Lasix. She has been on the Zaroxolyn for approximately 3 days. Since that time, the patient has had increasing weakness. She contacted her PCP, and the triage nurse to come to the emergency department. For the past month, the patient has been complaining of some orthopnea type symptoms, heterogeneous lipomatous and  occasionally sleeping on a recliner. She had a Myoview performed on 05/02/2014 which was normal, and had normal EF. The patient has had subjective fevers and chills, but denies any coughing, hemoptysis, nausea, vomiting, diarrhea, abdominal pain, dysuria, hematuria. She has had some dizziness today with intermittent chest discomfort with rest and on exertion.  Hospital Course:   Dehydration/AKI -this likely explains her generalized weakness-->will also check UA -Likely due to the patient's recent diuretics with the recent addition of Zaroxolyn 3 days Prior to this admission -the patient is hemoconcentrated with a hemoglobin of 16.0 -Baseline hemoglobin is around 13 -Baseline creatinine 0.8-0.9 -The patient was given 2 L normal saline emergency department--I will hold off any additional IV fluids at this time pending repeat labs in the morning -Morning labs showing creatinine improved to 1.1 from 1.25 on admission. Patient felt much better. -Patient requested to be discharge to follow-up with primary care physician.  Atypical chest pain/abnormal EKG -Dr. Winfred Gordon spoke with cards, Dr. Aundra Gordon, who did not feel pt needed cards consult at this time and to reconsult if clinical situation changes -3 sets of troponin and repeat EKG showed no evidence of ACS, patient is on aspirin. -Chest pain is likely musculoskeletal in nature.  Dyspnea on exertion -Etiology is unclear -Ox saturation 99% on room air -Patient may have a degree of deconditioning as well as some anxiety with are likely contributing to her dyspnea -Certainly, the patient may have had some pulmonary toxicity from her prior chemotherapy for her breast cancer, but this was 20 years ago -the patient does not appear clinically volume overloaded  Lower extremity edema and pain -lower extremity duplex is negative  for DVT -Urinalysis did not show proteinuria to suggest nephrotic syndrome.  Hyperglycemia -Hemoglobin A1c is 6.0. No  evidence of diabetes.  Procedures:  none  Consultations:  None  Discharge Exam: Filed Vitals:   05/11/14 1313  BP: 117/57  Pulse: 67  Temp: 97.5 F (36.4 C)  Resp: 19   General: Alert and awake, oriented x3, not in any acute distress. HEENT: anicteric sclera, pupils reactive to light and accommodation, EOMI CVS: S1-S2 clear, no murmur rubs or gallops Chest: clear to auscultation bilaterally, no wheezing, rales or rhonchi Abdomen: soft nontender, nondistended, normal bowel sounds, no organomegaly Extremities: no cyanosis, clubbing or edema noted bilaterally Neuro: Cranial nerves II-XII intact, no focal neurological deficits  Discharge Instructions You were cared for by a hospitalist during your hospital stay. If you have any questions about your discharge medications or the care you received while you were in the hospital after you are discharged, you can call the unit and asked to speak with the hospitalist on call if the hospitalist that took care of you is not available. Once you are discharged, your primary care physician will handle any further medical issues. Please note that NO REFILLS for any discharge medications will be authorized once you are discharged, as it is imperative that you return to your primary care physician (or establish a relationship with a primary care physician if you do not have one) for your aftercare needs so that they can reassess your need for medications and monitor your lab values.  Discharge Instructions    Diet - low sodium heart healthy    Complete by:  As directed      Increase activity slowly    Complete by:  As directed           Current Discharge Medication List    CONTINUE these medications which have NOT CHANGED   Details  ALPRAZolam (XANAX) 0.5 MG tablet take 1 tablet by mouth three times a day if needed for sleep Qty: 90 tablet, Refills: 1    buPROPion (WELLBUTRIN XL) 300 MG 24 hr tablet Take 300 mg by mouth every morning.     docusate sodium (COLACE) 100 MG capsule Take 100 mg by mouth daily as needed for mild constipation.    furosemide (LASIX) 40 MG tablet Take 0.5 tablets (20 mg total) by mouth daily as needed for fluid or edema. Qty: 30 tablet, Refills: 1   Associated Diagnoses: Acute pulmonary edema; Pedal edema; SOB (shortness of breath)    HYDROcodone-acetaminophen (NORCO) 5-325 MG per tablet Take 1-2 tablets by mouth every 6 (six) hours as needed for moderate pain. Qty: 60 tablet, Refills: 0    levothyroxine (SYNTHROID, LEVOTHROID) 75 MCG tablet take 1 tablet by mouth once daily--  takes in am    loratadine (CLARITIN) 10 MG tablet Take 1 tablet (10 mg total) by mouth daily as needed for allergies. Qty: 30 tablet, Refills: 11   Associated Diagnoses: Allergic state, subsequent encounter    neomycin-polymyxin-hydrocortisone (CORTISPORIN) otic solution Place 3 drops into the right ear 4 (four) times daily. Qty: 10 mL, Refills: 0    ondansetron (ZOFRAN) 8 MG tablet Take 8 mg by mouth every 8 (eight) hours as needed.     polyethylene glycol (MIRALAX / GLYCOLAX) packet Take 17 g by mouth daily as needed for mild constipation.     promethazine (PHENERGAN) 25 MG tablet Take 25 mg by mouth every 6 (six) hours as needed for nausea or vomiting.  STOP taking these medications     metolazone (ZAROXOLYN) 2.5 MG tablet        Allergies  Allergen Reactions  . Lamictal [Lamotrigine] Rash    Steven's Johnson Syndrome  . Morphine And Related Anaphylaxis    Tolerates hydrocodone  . Cymbalta [Duloxetine Hcl] Nausea Only    Personality changes  . Sertraline Hcl Itching    "feel weird"   Follow-up Information    Follow up with Christina Homans, MD In 1 week.   Specialty:  Family Medicine   Contact information:   [address] [place] [address] [phone]        The results of significant diagnostics from this hospitalization (including imaging, microbiology, ancillary and  laboratory) are listed below for reference.    Significant Diagnostic Studies: Dg Chest 2 View  05/08/2014   CLINICAL DATA:  Knee surgery 6 weeks ago, shortness of breath since, acute pulmonary edema, pedal edema, personal history of coronary artery disease and breast cancer post bone marrow transplant, non ischemic cardiomyopathy  EXAM: CHEST  2 VIEW  COMPARISON:  04/29/2014 chest radiograph and CT exams  FINDINGS: Normal heart size and pulmonary vascularity.  Prominent markings and opacity at the RIGHT middle lobe are likely related to mild pectus deformity as seen on recent CT chest and are unchanged.  Lungs clear.  No pleural effusion or pneumothorax.  Post RIGHT axillary lymph node dissection.  Bones demineralized.  IMPRESSION: No acute abnormalities.   Electronically Signed   By: Lavonia Dana M.D.   On: 05/08/2014 08:28   Ct Angio Chest Pe W/cm &/or Wo Cm  04/29/2014   CLINICAL DATA:  Shortness of breath for the past 4 weeks. Worse with minimal exertion. Associated chest tightness. Presyncope sensation. Knee surgery 4 weeks ago.  EXAM: CT ANGIOGRAPHY CHEST WITH CONTRAST  TECHNIQUE: Multidetector CT imaging of the chest was performed using the standard protocol during bolus administration of intravenous contrast. Multiplanar CT image reconstructions and MIPs were obtained to evaluate the vascular anatomy.  CONTRAST:  38mL OMNIPAQUE IOHEXOL 350 MG/ML SOLN  COMPARISON:  Portable chest obtained earlier today. The chest CTA dated 10/15/2013.  FINDINGS: Normally opacified pulmonary arteries with no pulmonary arterial filling defects. Interval linear density in the medial aspect of the right middle lobe. Interval mild increase in prominence of the interstitial markings with resultant visualization of some bullous changes in the left lower lobe. No lung nodules or enlarged lymph nodes. Unremarkable upper abdomen. Mild thoracic spine degenerative changes.  Review of the MIP images confirms the above findings.   IMPRESSION: 1. No pulmonary emboli. 2. Interval mild interstitial pulmonary edema. Interstitial pneumonitis is less likely. 3. Interval linear atelectasis in the right middle lobe. 4. Mild changes of COPD.   Electronically Signed   By: Enrique Sack M.D.   On: 04/29/2014 20:20   Dg Chest Port 1 View  05/10/2014   CLINICAL DATA:  Six week history of difficulty breathing  EXAM: PORTABLE CHEST - 1 VIEW  COMPARISON:  May 07, 2014  FINDINGS: There is no edema or consolidation. The heart size and pulmonary vascularity are normal. No adenopathy. No pneumothorax. There are surgical clips in the right axillary region, stable. There is a small presumed bone island in the proximal right humeral metaphysis.  IMPRESSION: No edema or consolidation.   Electronically Signed   By: Lowella Grip M.D.   On: 05/10/2014 16:20   Dg Chest Port 1 View  04/29/2014   CLINICAL DATA:  Shortness of breath for 4 weeks. Worse with minimal exertion and associated chest tightness.  EXAM: PORTABLE CHEST - 1 VIEW  COMPARISON:  12/22/2013  FINDINGS: Surgical clips in the right axilla. Stable appearance of the heart and mediastinum. The trachea is midline. Negative for airspace disease or pulmonary edema.  IMPRESSION: No active disease.   Electronically Signed   By: Markus Daft M.D.   On: 04/29/2014 18:46    Microbiology: No results found for this or any previous visit (from the past 240 hour(s)).   Labs: Basic Metabolic Panel:  Recent Labs Lab 05/07/14 1542 05/10/14 1516 05/11/14 0125 05/11/14 0810  NA 141 137 137  --   K 3.5 3.8 3.2*  --   CL 106 94* 101  --   CO2 30 22 23   --   GLUCOSE 59* 181* 97  --   BUN 22 35* 27*  --   CREATININE 0.9 1.25* 1.16*  --   CALCIUM 8.8 9.9 7.4*  --   MG  --   --   --  1.9  PHOS 2.6  --   --   --    Liver Function Tests:  Recent Labs Lab 05/07/14 1542 05/11/14 0125  AST  --  18  ALT  --  14  ALKPHOS  --  81  BILITOT  --  0.4  PROT  --  5.2*  ALBUMIN 3.4* 2.8*   No  results for input(s): LIPASE, AMYLASE in the last 168 hours. No results for input(s): AMMONIA in the last 168 hours. CBC:  Recent Labs Lab 05/07/14 1542 05/10/14 1516 05/11/14 0125  WBC 5.9 8.6 6.7  HGB 13.1 16.0* 12.2  HCT 39.5 46.7* 36.6  MCV 95.3 92.1 96.6  PLT 249.0 289 211   Cardiac Enzymes:  Recent Labs Lab 05/10/14 1957 05/11/14 0125 05/11/14 0810  TROPONINI <0.30 <0.30 <0.30   BNP: BNP (last 3 results)  Recent Labs  04/29/14 1725 05/07/14 1542 05/10/14 1516  PROBNP 151.0* 57.0 74.4   CBG: No results for input(s): GLUCAP in the last 168 hours.     Signed:  Aliea Bobe A  [facility] 05/11/2014, 4:03 PM

## 2014-05-12 NOTE — Assessment & Plan Note (Signed)
Resolved, no sign of infection

## 2014-05-12 NOTE — Assessment & Plan Note (Signed)
Continues to struggle with hi anxiety state but does not want to change meds at this time.

## 2014-05-12 NOTE — Assessment & Plan Note (Signed)
Avoid offending foods, start probiotics. Do not eat large meals in late evening and consider raising head of bed.  

## 2014-05-12 NOTE — Assessment & Plan Note (Signed)
Improved

## 2014-05-12 NOTE — Assessment & Plan Note (Addendum)
Minimal effusion, resolved. BNP improving but dyspnea is still present. Encouraged increased rest and hydration, add probiotics, zinc such as Coldeze or Xicam. Treat fevers as needed

## 2014-05-14 ENCOUNTER — Encounter: Payer: Self-pay | Admitting: Family Medicine

## 2014-05-17 ENCOUNTER — Ambulatory Visit (INDEPENDENT_AMBULATORY_CARE_PROVIDER_SITE_OTHER): Payer: Medicare Other | Admitting: Physician Assistant

## 2014-05-17 ENCOUNTER — Encounter: Payer: Self-pay | Admitting: Physician Assistant

## 2014-05-17 ENCOUNTER — Telehealth: Payer: Self-pay

## 2014-05-17 VITALS — BP 126/86 | HR 84 | Temp 98.2°F | Resp 18 | Wt 160.1 lb

## 2014-05-17 DIAGNOSIS — R0789 Other chest pain: Secondary | ICD-10-CM

## 2014-05-17 DIAGNOSIS — R0600 Dyspnea, unspecified: Secondary | ICD-10-CM

## 2014-05-17 DIAGNOSIS — R899 Unspecified abnormal finding in specimens from other organs, systems and tissues: Secondary | ICD-10-CM | POA: Diagnosis not present

## 2014-05-17 DIAGNOSIS — N179 Acute kidney failure, unspecified: Secondary | ICD-10-CM

## 2014-05-17 LAB — BASIC METABOLIC PANEL
BUN: 20 mg/dL (ref 6–23)
CO2: 29 mEq/L (ref 19–32)
Calcium: 9.2 mg/dL (ref 8.4–10.5)
Chloride: 103 mEq/L (ref 96–112)
Creatinine, Ser: 0.9 mg/dL (ref 0.4–1.2)
GFR: 67.69 mL/min (ref >60.00)
Glucose, Bld: 92 mg/dL (ref 70–99)
Potassium: 4 mEq/L (ref 3.5–5.1)
Sodium: 138 mEq/L (ref 135–145)

## 2014-05-17 LAB — CBC
HCT: 41.3 % (ref 36.0–46.0)
Hemoglobin: 13.6 g/dL (ref 12.0–15.0)
MCHC: 32.9 g/dL (ref 30.0–36.0)
MCV: 97.2 fl (ref 78.0–100.0)
Platelets: 254 10*3/uL (ref 150.0–400.0)
RBC: 4.25 Mil/uL (ref 3.87–5.11)
RDW: 13.2 % (ref 11.5–15.5)
WBC: 7 10*3/uL (ref 4.0–10.5)

## 2014-05-17 NOTE — Telephone Encounter (Signed)
Call completed on 05/14/14  Admission Date:  05/10/14 Discharge Date:  05/11/14  Reason for admission:  Acute Kidney Injury/Dehydration  Recommendations for PCP to Follow: Follow up with PCP-1 week  Transition Care Management Follow-up Telephone Call  How have you been since you were released from the hospital?  Pt has been experiencing weakness.  Headaches. Felt like she was going to collapse yesterday (05/13/14)--heart palpitations--Currently self-treated with rest and hydration.  States she feels stronger today (05/14/14).     Do you understand why you were in the hospital? yes   Do you understand the discharge instructions? yes  Items Reviewed:  Medications reviewed: yes; no change in meds.   Allergies reviewed: yes  Dietary changes reviewed: yes, low sodium, heart healthy.  States she never uses salt.    Referrals reviewed: yes, Ortho-November 30th; Pulmonary   Functional Questionnaire:   Activities of Daily Living (ADLs):   She states they are independent in the following: ambulation, bathing and hygiene, feeding, continence, grooming, toileting and dressing States they require assistance with the following: none   Any transportation issues/concerns?: no   Any patient concerns? no   Confirmed importance and date/time of follow-up visits scheduled: yes   Confirmed with patient if condition begins to worsen call PCP or go to the ER.  Patient was given the Call-a-Nurse line [phone]: yes   Hospital follow up appointment scheduled with Elyn Aquas, PA-C 05/17/14 @ 11:30 am.

## 2014-05-17 NOTE — Progress Notes (Signed)
Pre visit review using our clinic review tool, if applicable. No additional management support is needed unless otherwise documented below in the visit note. 

## 2014-05-17 NOTE — Patient Instructions (Signed)
Please resume your Lasix at original dosing.  Continue all other medications as directed.  I will call you with your lab results.  It is very important to follow-up with Cardiology and Pulmonology as scheduled.  I do feel you need updated Pulmonary Function Testing.  Dr. Lamonte Sakai will assess the need for this.  If symptoms recur and are persistent, please call office or proceed to ER.
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
## 2014-05-20 ENCOUNTER — Other Ambulatory Visit: Payer: Self-pay | Admitting: Physician Assistant

## 2014-05-21 NOTE — Assessment & Plan Note (Signed)
Mostly resolved.  O2 sats are great on RA, even with ambulation. Patient with scheduled follow-up with Pulmonology for assessment and repeat PFTs giving her history. Continue Lasix as directed by PCP.

## 2014-05-21 NOTE — Assessment & Plan Note (Signed)
Resolving.  Will obtain repeat BMP at today's visit to assess Cr level.  Patient with good urinary output.

## 2014-05-21 NOTE — Assessment & Plan Note (Signed)
Unclear etiology although giving patient's affect and comments by Hospitalist, suspect anxiety is playing a key role in symptoms. Cardiac workup inpatient was unremarkable. Asymptomatic at present.  Patient seems very upset at the mention that her anxiety may be contributing. Follow-up with Cardiology and Pulmonology as scheduled.

## 2014-05-30 ENCOUNTER — Ambulatory Visit: Payer: Medicare Other | Admitting: Physician Assistant

## 2014-05-31 ENCOUNTER — Encounter: Payer: Self-pay | Admitting: Emergency Medicine

## 2014-05-31 ENCOUNTER — Ambulatory Visit (INDEPENDENT_AMBULATORY_CARE_PROVIDER_SITE_OTHER): Payer: Medicare Other | Admitting: Emergency Medicine

## 2014-05-31 VITALS — BP 136/86 | HR 91 | Ht 65.5 in | Wt 163.4 lb

## 2014-05-31 DIAGNOSIS — R0600 Dyspnea, unspecified: Secondary | ICD-10-CM
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
## 2014-05-31 NOTE — Patient Instructions (Addendum)
We will perform repeat pulmonary function testing  Your CT chest is for the most part stable, but there is a very subtle increase in interstitial prominence that we can correlate with your pulmonary function testing Your cardiac testing thus far has been reassuring.  Follow with Dr Lamonte Sakai in 1 month

## 2014-06-04 ENCOUNTER — Ambulatory Visit: Payer: Medicare Other | Admitting: Cardiology

## 2014-06-04 ENCOUNTER — Encounter: Payer: Self-pay | Admitting: Adult Health

## 2014-06-04 ENCOUNTER — Ambulatory Visit (INDEPENDENT_AMBULATORY_CARE_PROVIDER_SITE_OTHER): Payer: Medicare Other | Admitting: Adult Health

## 2014-06-04 ENCOUNTER — Ambulatory Visit: Payer: Medicare Other | Admitting: Family Medicine

## 2014-06-04 VITALS — BP 138/82 | HR 89 | Temp 98.6°F | Ht 65.5 in | Wt 162.6 lb

## 2014-06-04 DIAGNOSIS — J209 Acute bronchitis, unspecified: Secondary | ICD-10-CM | POA: Insufficient documentation

## 2014-06-04 MED ORDER — AZITHROMYCIN 250 MG PO TABS: ORAL_TABLET | ORAL | Status: DC

## 2014-06-04 MED ORDER — HYDROCODONE-HOMATROPINE 5-1.5 MG/5ML PO SYRP: 5.0000 mL | ORAL_SOLUTION | Freq: Four times a day (QID) | ORAL | Status: DC | PRN

## 2014-06-04 NOTE — Patient Instructions (Signed)
Zpack take as directed.  Mucinex DM Twice daily As needed  Cough/congestioin  Fluids and rest  Saline nasal rinses As needed   Tylenol As needed   Claritin 10mg  daily As needed  Drainage  Hydromet 1 tsp every 6hrs as needed for cough, may make you sleepy Please contact office for sooner follow up if symptoms do not improve or worsen or seek emergency care  Follow up  as planned with Dr. Lamonte Sakai  For PFT

## 2014-06-04 NOTE — Progress Notes (Signed)
Subjective:    Patient ID: [name], female    DOB: 10-02-1955, 58 y.o.   MRN: [id number]  HPI 58 yo woman, never smoker, hx of breast CA (90's) with auto-stem tx, non-ischemic CM from chemo, CAD, allergies, IBS, HH. She was noted to have pulm toxicity from her chemo, followed by CT scan and PFT.   She returns today reporting that she has been feeling worse since March '15. She had to push herself very hard to take care of dependent family up until that time. Then subsequently she had some increase in dyspnea, had a few URI's. She underwent L knee sgy 02/23/14, has felt worse, more dyspneic since that procedure. She has been evaluated with TTE 11/2 that showed normal LV, RV fxn and normal PASP. Same day she had a CT chest that showed no PE, some very subtle diffuse GGI that was thought to be mild edema.  Then on 11/5 she had a reassuring perfusion scan   PULMONARY FUNCTON TEST 05/31/2008 06/24/2008 01/08/2011  FVC 3.12 3.27 2.93  FEV1 2.46 2.54 2.22  FEV1/FVC 78.8 77.7 75.8  FVC % Predicted 91 93 84  FEV % Predicted 95 95 85  FeF 25-75 2.27 2.29 1.86  FeF 25-75 % Predicted 2.94 2.98 2.92   FEV1 2.30L at [facility] 02/07/13  Myocardial perfusion Imaging 05/01/14 > negative  06/04/2014 Acute OV  Complains of  dry cough, increased SOB, some wheezing, low grade temp w/ chills/sweats, sinus pressure/congestion w/ yellow/green drainage, some PND, bilateral ear congestion for 2 weeks , worse for last 3 days .  Has chills and sore throat.  Taking tylenol  Had flu shot.  Was seen last week for pulmonary consult for dyspnea. Has upcoming PFT next month  She is a never smoker  Last abx has been months ago.  Cough is keeping her up at night.        Review of Systems  Constitutional:   No  weight loss, night sweats,  Fevers, chills, + fatigue, or  lassitude.  HEENT:   No headaches,  Difficulty swallowing,  Tooth/dental problems, or  Sore throat,   No sneezing, itching,  +ear congestion , nasal congestion, post nasal drip,   CV:  No chest pain,  Orthopnea, PND, swelling in lower extremities, anasarca, dizziness, palpitations, syncope.   GI  No heartburn, indigestion, abdominal pain, nausea, vomiting, diarrhea, change in bowel habits, loss of appetite, bloody stools.   Resp:    No chest wall deformity  Skin: no rash or lesions.  GU: no dysuria, change in color of urine, no urgency or frequency.  No flank pain, no hematuria   MS:  No joint pain or swelling.  No decreased range of motion.  No back pain.  Psych:  No change in mood or affect. No depression or anxiety.  No memory loss.           Objective:   Physical Exam  Gen: Pleasant, well-nourished, in no distress,  normal affect  ENT: No lesions,  mouth clear,  oropharynx clear, no postnasal drip  Neck: No JVD, no TMG, no carotid bruits  Lungs: No use of accessory muscles, clear without rales or rhonchi  Cardiovascular: RRR, heart sounds normal, no murmur or gallops, no peripheral edema  Musculoskeletal: No deformities, no cyanosis or clubbing  Neuro: alert, non focal  Skin: Warm, no lesions or rashes    04/29/14 --  COMPARISON: Portable chest obtained earlier today. The chest CTA dated 10/15/2013. FINDINGS: Normally  opacified pulmonary arteries with no pulmonary arterial filling defects. Interval linear density in the medial aspect of the right middle lobe. Interval mild increase in prominence of the interstitial markings with resultant visualization of some bullous changes in the left lower lobe. No lung nodules or enlarged lymph nodes. Unremarkable upper abdomen. Mild thoracic spine degenerative changes. Review of the MIP images confirms the above findings.  IMPRESSION: 1. No pulmonary emboli. 2. Interval mild interstitial pulmonary edema. Interstitial pneumonitis is less likely. 3. Interval linear atelectasis in the right middle lobe. 4. Mild  changes of COPD      Assessment & Plan:

## 2014-06-04 NOTE — Assessment & Plan Note (Signed)
AB/AR flare   Plan  Zpack take as directed.  Mucinex DM Twice daily As needed  Cough/congestioin  Fluids and rest  Saline nasal rinses As needed   Tylenol As needed   Claritin 10mg  daily As needed  Drainage  Hydromet 1 tsp every 6hrs as needed for cough, may make you sleepy Please contact office for sooner follow up if symptoms do not improve or worsen or seek emergency care  Follow up  as planned with Dr. Lamonte Sakai  For PFT

## 2014-06-06 ENCOUNTER — Encounter (HOSPITAL_COMMUNITY): Payer: Medicare Other

## 2014-06-06 ENCOUNTER — Ambulatory Visit (INDEPENDENT_AMBULATORY_CARE_PROVIDER_SITE_OTHER): Payer: Medicare Other | Admitting: Family Medicine

## 2014-06-06 ENCOUNTER — Encounter: Payer: Self-pay | Admitting: Family Medicine

## 2014-06-06 VITALS — BP 113/86 | HR 96 | Temp 98.0°F | Ht 65.0 in | Wt 160.4 lb

## 2014-06-06 DIAGNOSIS — J209 Acute bronchitis, unspecified: Secondary | ICD-10-CM

## 2014-06-06 DIAGNOSIS — K219 Gastro-esophageal reflux disease without esophagitis: Secondary | ICD-10-CM

## 2014-06-06 DIAGNOSIS — F418 Other specified anxiety disorders: Secondary | ICD-10-CM

## 2014-06-06 DIAGNOSIS — E039 Hypothyroidism, unspecified: Secondary | ICD-10-CM

## 2014-06-06 MED ORDER — DOXYCYCLINE HYCLATE 100 MG PO TABS: 100.0000 mg | ORAL_TABLET | Freq: Two times a day (BID) | ORAL | Status: DC

## 2014-06-06 NOTE — Progress Notes (Signed)
Pre visit review using our clinic review tool, if applicable. No additional management support is needed unless otherwise documented below in the visit note. 

## 2014-06-06 NOTE — Patient Instructions (Signed)

## 2014-06-09 DIAGNOSIS — N179 Acute kidney failure, unspecified: Secondary | ICD-10-CM

## 2014-06-09 DIAGNOSIS — R0789 Other chest pain: Secondary | ICD-10-CM

## 2014-06-09 DIAGNOSIS — R0600 Dyspnea, unspecified: Secondary | ICD-10-CM

## 2014-06-10 ENCOUNTER — Other Ambulatory Visit: Payer: Self-pay | Admitting: Family Medicine

## 2014-06-10 ENCOUNTER — Telehealth: Payer: Self-pay | Admitting: *Deleted

## 2014-06-10 NOTE — Telephone Encounter (Signed)
OK to refill Alprazolam, same strength, same sign #90 with 1 rf

## 2014-06-10 NOTE — Assessment & Plan Note (Signed)
Continues to struggle is tolerating Bupropion but struggles with lability and anxiety, may continue Alprazolam prn. Encouraged counseling as an adjunct
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
## 2014-06-10 NOTE — Assessment & Plan Note (Signed)
Avoid offending foods, start probiotics. Do not eat large meals in late evening and consider raising head of bed.  

## 2014-06-10 NOTE — Assessment & Plan Note (Signed)
On Levothyroxine, continue to monitor 

## 2014-06-10 NOTE — Assessment & Plan Note (Signed)
Was improving some on antibiotics, now worsening. So start Doxycycline. Encouraged increased rest and hydration, add probiotics, zinc such as Coldeze or Xicam. Treat fevers as needed

## 2014-06-10 NOTE — Telephone Encounter (Signed)
rx refill -xanax 0.5mg   Last OV_ 06/06/14 Last refilled- 03/14/14 # 90 / 1 rf  UDS- none

## 2014-06-11 NOTE — Telephone Encounter (Signed)
Done . rx faxed to pts pharmancy

## 2014-07-02 HISTORY — PX: DENTAL SURGERY: SHX609

## 2014-07-05 ENCOUNTER — Ambulatory Visit: Payer: Medicare Other | Admitting: Adult Health

## 2014-07-09 ENCOUNTER — Encounter: Payer: Self-pay | Admitting: Family Medicine

## 2014-07-09 ENCOUNTER — Ambulatory Visit (INDEPENDENT_AMBULATORY_CARE_PROVIDER_SITE_OTHER): Payer: Medicare Other | Admitting: Family Medicine

## 2014-07-09 VITALS — BP 101/69 | HR 82 | Temp 97.7°F | Resp 16 | Wt 163.2 lb

## 2014-07-09 DIAGNOSIS — J329 Chronic sinusitis, unspecified: Secondary | ICD-10-CM | POA: Diagnosis not present

## 2014-07-09 DIAGNOSIS — A499 Bacterial infection, unspecified: Secondary | ICD-10-CM | POA: Diagnosis not present

## 2014-07-09 DIAGNOSIS — E039 Hypothyroidism, unspecified: Secondary | ICD-10-CM

## 2014-07-09 DIAGNOSIS — K219 Gastro-esophageal reflux disease without esophagitis: Secondary | ICD-10-CM

## 2014-07-09 DIAGNOSIS — J209 Acute bronchitis, unspecified: Secondary | ICD-10-CM

## 2014-07-09 DIAGNOSIS — B9689 Other specified bacterial agents as the cause of diseases classified elsewhere: Secondary | ICD-10-CM

## 2014-07-09 MED ORDER — CEFDINIR 300 MG PO CAPS: 300.0000 mg | ORAL_CAPSULE | Freq: Two times a day (BID) | ORAL | Status: AC

## 2014-07-09 MED ORDER — BUPROPION HCL ER (XL) 150 MG PO TB24: 150.0000 mg | ORAL_TABLET | Freq: Every day | ORAL | Status: DC

## 2014-07-09 MED ORDER — FLUOXETINE HCL 20 MG PO CAPS: 20.0000 mg | ORAL_CAPSULE | Freq: Every day | ORAL | Status: DC
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
## 2014-07-09 NOTE — Progress Notes (Signed)
Pre visit review using our clinic review tool, if applicable. No additional management support is needed unless otherwise documented below in the visit note. 

## 2014-07-09 NOTE — Patient Instructions (Signed)
Plain Mucinex twice daily, probiotics and nasal saline daily  Sinusitis Sinusitis is redness, soreness, and inflammation of the paranasal sinuses. Paranasal sinuses are air pockets within the bones of your face (beneath the eyes, the middle of the forehead, or above the eyes). In healthy paranasal sinuses, mucus is able to drain out, and air is able to circulate through them by way of your nose. However, when your paranasal sinuses are inflamed, mucus and air can become trapped. This can allow bacteria and other germs to grow and cause infection. Sinusitis can develop quickly and last only a short time (acute) or continue over a long period (chronic). Sinusitis that lasts for more than 12 weeks is considered chronic.  CAUSES  Causes of sinusitis include:  Allergies.  Structural abnormalities, such as displacement of the cartilage that separates your nostrils (deviated septum), which can decrease the air flow through your nose and sinuses and affect sinus drainage.  Functional abnormalities, such as when the small hairs (cilia) that line your sinuses and help remove mucus do not work properly or are not present. SIGNS AND SYMPTOMS  Symptoms of acute and chronic sinusitis are the same. The primary symptoms are pain and pressure around the affected sinuses. Other symptoms include:  Upper toothache.  Earache.  Headache.  Bad breath.  Decreased sense of smell and taste.  A cough, which worsens when you are lying flat.  Fatigue.  Fever.  Thick drainage from your nose, which often is green and may contain pus (purulent).  Swelling and warmth over the affected sinuses. DIAGNOSIS  Your health care provider will perform a physical exam. During the exam, your health care provider may:  Look in your nose for signs of abnormal growths in your nostrils (nasal polyps).  Tap over the affected sinus to check for signs of infection.  View the inside of your sinuses (endoscopy) using an  imaging device that has a light attached (endoscope). If your health care provider suspects that you have chronic sinusitis, one or more of the following tests may be recommended:  Allergy tests.  Nasal culture. A sample of mucus is taken from your nose, sent to a lab, and screened for bacteria.  Nasal cytology. A sample of mucus is taken from your nose and examined by your health care provider to determine if your sinusitis is related to an allergy. TREATMENT  Most cases of acute sinusitis are related to a viral infection and will resolve on their own within 10 days. Sometimes medicines are prescribed to help relieve symptoms (pain medicine, decongestants, nasal steroid sprays, or saline sprays).  However, for sinusitis related to a bacterial infection, your health care provider will prescribe antibiotic medicines. These are medicines that will help kill the bacteria causing the infection.  Rarely, sinusitis is caused by a fungal infection. In theses cases, your health care provider will prescribe antifungal medicine. For some cases of chronic sinusitis, surgery is needed. Generally, these are cases in which sinusitis recurs more than 3 times per year, despite other treatments. HOME CARE INSTRUCTIONS   Drink plenty of water. Water helps thin the mucus so your sinuses can drain more easily.  Use a humidifier.  Inhale steam 3 to 4 times a day (for example, sit in the bathroom with the shower running).  Apply a warm, moist washcloth to your face 3 to 4 times a day, or as directed by your health care provider.  Use saline nasal sprays to help moisten and clean your sinuses.  Take  medicines only as directed by your health care provider.  If you were prescribed either an antibiotic or antifungal medicine, finish it all even if you start to feel better. SEEK IMMEDIATE MEDICAL CARE IF:  You have increasing pain or severe headaches.  You have nausea, vomiting, or drowsiness.  You have  swelling around your face.  You have vision problems.  You have a stiff neck.  You have difficulty breathing. MAKE SURE YOU:   Understand these instructions.  Will watch your condition.  Will get help right away if you are not doing well or get worse. Document Released: 06/14/2005 Document Revised: 10/29/2013 Document Reviewed: 06/29/2011 [facility] Patient Information 2015 [place], [place]. This information is not intended to replace advice given to you by your health care provider. Make sure you discuss any questions you have with your health care provider.

## 2014-07-11 DIAGNOSIS — H04123 Dry eye syndrome of bilateral lacrimal glands: Secondary | ICD-10-CM | POA: Diagnosis not present

## 2014-07-11 DIAGNOSIS — H01001 Unspecified blepharitis right upper eyelid: Secondary | ICD-10-CM | POA: Diagnosis not present

## 2014-07-11 DIAGNOSIS — H01002 Unspecified blepharitis right lower eyelid: Secondary | ICD-10-CM | POA: Diagnosis not present

## 2014-07-11 DIAGNOSIS — H01004 Unspecified blepharitis left upper eyelid: Secondary | ICD-10-CM | POA: Diagnosis not present

## 2014-07-14 NOTE — Assessment & Plan Note (Signed)
On Levothyroxine, continue to monitor 

## 2014-07-14 NOTE — Assessment & Plan Note (Signed)
Encouraged increased rest and hydration, add probiotics, zinc such as Coldeze or Xicam. Treat fevers as needed. Started on antibiotics.

## 2014-07-14 NOTE — Assessment & Plan Note (Signed)
Avoid offending foods, start probiotics. Do not eat large meals in late evening and consider raising head of bed.  

## 2014-07-14 NOTE — Assessment & Plan Note (Signed)
improved

## 2014-08-26 ENCOUNTER — Ambulatory Visit (INDEPENDENT_AMBULATORY_CARE_PROVIDER_SITE_OTHER): Payer: Medicare Other | Admitting: Emergency Medicine

## 2014-08-26 ENCOUNTER — Encounter: Payer: Self-pay | Admitting: Emergency Medicine

## 2014-08-26 VITALS — BP 118/82 | HR 98 | Ht 66.0 in | Wt 157.0 lb

## 2014-08-26 DIAGNOSIS — R0600 Dyspnea, unspecified: Secondary | ICD-10-CM | POA: Diagnosis not present

## 2014-08-26 DIAGNOSIS — R0602 Shortness of breath: Secondary | ICD-10-CM | POA: Diagnosis not present

## 2014-08-26 LAB — PULMONARY FUNCTION TEST
DL/VA % pred: 75 %
DL/VA: 3.79 ml/min/mmHg/L
DLCO unc % pred: 54 %
DLCO unc: 14.74 ml/min/mmHg
FEF 25-75 Post: 2.13 L/sec
FEF 25-75 Pre: 2.23 L/sec
FEF2575-%Change-Post: -4 %
FEF2575-%Pred-Post: 82 %
FEF2575-%Pred-Pre: 86 %
FEV1-%Change-Post: 0 %
FEV1-%Pred-Post: 84 %
FEV1-%Pred-Pre: 84 %
FEV1-Post: 2.37 L
FEV1-Pre: 2.38 L
FEV1FVC-%Change-Post: 5 %
FEV1FVC-%Pred-Pre: 99 %
FEV6-%Change-Post: -5 %
FEV6-%Pred-Post: 81 %
FEV6-%Pred-Pre: 85 %
FEV6-Post: 2.84 L
FEV6-Pre: 3 L
FEV6FVC-%Change-Post: 0 %
FEV6FVC-%Pred-Post: 103 %
FEV6FVC-%Pred-Pre: 102 %
FVC-%Change-Post: -5 %
FVC-%Pred-Post: 78 %
FVC-%Pred-Pre: 83 %
FVC-Post: 2.84 L
FVC-Pre: 3.01 L
Post FEV1/FVC ratio: 83 %
Post FEV6/FVC ratio: 100 %
Pre FEV1/FVC ratio: 79 %
Pre FEV6/FVC Ratio: 100 %
RV % pred: 62 %
RV: 1.27 L
TLC % pred: 75 %
TLC: 4.04 L

## 2014-08-26 NOTE — Patient Instructions (Signed)
We will not change your medications at this time.  Continue to work hard on your exercise routine Follow with Dr Lamonte Sakai in 12 months or sooner if you have any problems We will discuss your symptoms and decide about any further testing you need.

## 2014-08-26 NOTE — Progress Notes (Signed)
Subjective:    Patient ID: [name], female    DOB: January 06, 1956, 59 y.o.   MRN: [id number]  HPI 59 yo woman, never smoker, hx of breast CA (90's) with auto-stem tx, non-ischemic CM from chemo, CAD, allergies, IBS, HH. She was noted to have pulm toxicity from her chemo, followed by CT scan and PFT.   She returns today reporting that she has been feeling worse since March '15. She had to push herself very hard to take care of dependent family up until that time. Then subsequently she had some increase in dyspnea, had a few URI's. She underwent L knee sgy 02/23/14, has felt worse, more dyspneic since that procedure. She has been evaluated with TTE 11/2 that showed normal LV, RV fxn and normal PASP. Same day she had a CT chest that showed no PE, some very subtle diffuse GGI that was thought to be mild edema.  Then on 11/5 she had a reassuring perfusion scan   PULMONARY FUNCTON TEST 05/31/2008 06/24/2008 01/08/2011  FVC 3.12 3.27 2.93  FEV1 2.46 2.54 2.22  FEV1/FVC 78.8 77.7 75.8  FVC % Predicted 91 93 84  FEV % Predicted 95 95 85  FeF 25-75 2.27 2.29 1.86  FeF 25-75 % Predicted 2.94 2.98 2.92   FEV1 2.30L at [facility] 02/07/13  Myocardial perfusion Imaging 05/01/14 > negative  Acute OV 06/04/14 Complains of  dry cough, increased SOB, some wheezing, low grade temp w/ chills/sweats, sinus pressure/congestion w/ yellow/green drainage, some PND, bilateral ear congestion for 2 weeks , worse for last 3 days .  Has chills and sore throat.  Taking tylenol  Had flu shot.  Was seen last week for pulmonary consult for dyspnea. Has upcoming PFT next month  She is a never smoker  Last abx has been months ago.  Cough is keeping her up at night.   ROV 08/26/14 -- follow-up visit for exertional dyspnea, possibly related to some degree of interstitial disease from prior chemotherapy for breast cancer. She has CT scan of her chest in 04/2014 that suggested a possible progression  of pneumonitis compared with 09/2013. Also noted was some mild interstitial atelectasis in the right middle lobe as well as some emphysematous change. She underwent repeat peripheral function testing on 08/26/14 that show an improvement in her FVC and FEV1 compared with July 2012. She is feeling better, wonders if some of her sx were due to a drop off in her exercise routine. Her spirometry and flow - volume curve suggest some very mild obstruction.      Objective:   Physical Exam Filed Vitals:   08/26/14 1613  BP: 118/82  Pulse: 98  Height: 5\' 6"  (1.676 m)  Weight: 157 lb (71.215 kg)  SpO2: 97%    Gen: Pleasant,well-nourished, in no distress,  normal affect  ENT: No lesions,  mouth clear,  oropharynx clear, no postnasal drip  Neck: No JVD, no TMG, no carotid bruits  Lungs: No use of accessory muscles, clear without rales or rhonchi  Cardiovascular: RRR, heart sounds normal, no murmur or gallops, no peripheral edema  Musculoskeletal: No deformities, no cyanosis or clubbing  Neuro: alert, non focal  Skin: Warm, no lesions or rashes    04/29/14 --  COMPARISON: Portable chest obtained earlier today. The chest CTA dated 10/15/2013. FINDINGS: Normally opacified pulmonary arteries with no pulmonary arterial filling defects. Interval linear density in the medial aspect of the right middle lobe. Interval mild increase in prominence of the interstitial markings with  resultant visualization of some bullous changes in the left lower lobe. No lung nodules or enlarged lymph nodes. Unremarkable upper abdomen. Mild thoracic spine degenerative changes. Review of the MIP images confirms the above findings.  IMPRESSION: 1. No pulmonary emboli. 2. Interval mild interstitial pulmonary edema. Interstitial pneumonitis is less likely. 3. Interval linear atelectasis in the right middle lobe. 4. Mild changes of COPD      Assessment & Plan:  DYSPNEA Dyspnea is improved with her exercise  routine. Her spirometry is improved, her total lung capacity is down slightly from 2012, her residual volume is stable and her diffusion capacity is stable. At this point I don't believe we need to do any further testing. We need follow-up in one year and decide at that time based on her symptoms whether she merits further spirometry, lung volumes, or a CT scan of the chest.

## 2014-08-26 NOTE — Progress Notes (Signed)
PFT done today. 

## 2014-08-26 NOTE — Assessment & Plan Note (Signed)
Dyspnea is improved with her exercise routine. Her spirometry is improved, her total lung capacity is down slightly from 2012, her residual volume is stable and her diffusion capacity is stable. At this point I don't believe we need to do any further testing. We need follow-up in one year and decide at that time based on her symptoms whether she merits further spirometry, lung volumes, or a CT scan of the chest.

## 2014-09-06 ENCOUNTER — Telehealth: Payer: Self-pay | Admitting: Family Medicine

## 2014-09-06 NOTE — Telephone Encounter (Signed)
Please notify pt that Dr.  Charlett Blake is out of the office, but I would recommend that she arrange follow up for re-evaluation and further discussion of her meds. Continue current meds for now. Advise pt to go to the ED if she develop thoughts of harming herself or others.

## 2014-09-06 NOTE — Telephone Encounter (Signed)
Caller name: Laree, Garron Relation to pt: self  Call back number: [phone] Pharmacy: [place], [place]. [phone] (Phone) [phone] (Fax)        Reason for call:  Pt would like to discuss buPROPion (WELLBUTRIN XL) 150 MG 24 hr tablet

## 2014-09-06 NOTE — Telephone Encounter (Signed)
Notified the patient and she stated that at her last OV (07/09/14) PCP changed her to Wellbutrin 150 mg and Prozac 20 mg.  She has been taking both since January.  She states she feels worse, has no feelings, no motivation, heart palpitations, not sleeping and feeling weak. The patient did state she has had problems in the past with SSRI's and PCP is aware of.  Please advise is she should continue and give it a little longer or what? Home number to call back [phone] or cell at [phone]

## 2014-09-08 NOTE — Telephone Encounter (Signed)
See if she is willing to increase her Wellbutrin XL back up to 300 mg daily and drop the Prozac to 20 mg tab but 1/2 tab and then have her come in in 2-4 weeks to discuss changes or sooner if symptoms worsen.

## 2014-09-09 MED ORDER — FLUOXETINE HCL 10 MG PO CAPS: 10.0000 mg | ORAL_CAPSULE | Freq: Every day | ORAL | Status: DC

## 2014-09-09 NOTE — Telephone Encounter (Signed)
Called the patient back informed of PCP instructions.  She is ok to increase wellbutrin to 300 (she still has her old bottle of the 300's and can just take from that bottle). Her prozac 20 mg are capsules, advise please

## 2014-09-09 NOTE — Telephone Encounter (Signed)
OK then send her in Prozac 10 mg caps 1 cap po daily disp #30 with 1 rf and then have her come in for evaluation in next month

## 2014-09-09 NOTE — Telephone Encounter (Signed)
Patient returned phone call. Best # [phone]

## 2014-09-09 NOTE — Telephone Encounter (Signed)
Sent in to the pharmacy as instructed.  The patient is scheduled next week 09/17/14 and will keep that appt.

## 2014-09-09 NOTE — Telephone Encounter (Signed)
Called left message to call back 

## 2014-09-17 ENCOUNTER — Ambulatory Visit (INDEPENDENT_AMBULATORY_CARE_PROVIDER_SITE_OTHER): Payer: Medicare Other | Admitting: Family Medicine

## 2014-09-17 ENCOUNTER — Encounter: Payer: Self-pay | Admitting: Family Medicine

## 2014-09-17 VITALS — BP 116/78 | HR 92 | Temp 98.0°F | Ht 65.5 in | Wt 153.0 lb

## 2014-09-17 DIAGNOSIS — K219 Gastro-esophageal reflux disease without esophagitis: Secondary | ICD-10-CM | POA: Diagnosis not present

## 2014-09-17 DIAGNOSIS — E039 Hypothyroidism, unspecified: Secondary | ICD-10-CM

## 2014-09-17 DIAGNOSIS — L309 Dermatitis, unspecified: Secondary | ICD-10-CM | POA: Diagnosis not present

## 2014-09-17 DIAGNOSIS — K5900 Constipation, unspecified: Secondary | ICD-10-CM

## 2014-09-17 DIAGNOSIS — F418 Other specified anxiety disorders: Secondary | ICD-10-CM | POA: Diagnosis not present

## 2014-09-17 DIAGNOSIS — J209 Acute bronchitis, unspecified: Secondary | ICD-10-CM

## 2014-09-17 MED ORDER — PAROXETINE HCL 10 MG PO TABS: ORAL_TABLET | ORAL | Status: DC

## 2014-09-17 MED ORDER — TRIAMCINOLONE ACETONIDE 0.1 % EX CREA: 1.0000 "application " | TOPICAL_CREAM | Freq: Two times a day (BID) | CUTANEOUS | Status: DC | PRN

## 2014-09-17 NOTE — Patient Instructions (Addendum)
Generalized Anxiety Disorder Generalized anxiety disorder (GAD) is a mental disorder. It interferes with life functions, including relationships, work, and school. GAD is different from normal anxiety, which everyone experiences at some point in their lives in response to specific life events and activities. Normal anxiety actually helps [place] prepare for and get through these life events and activities. Normal anxiety goes away after the event or activity is over.  GAD causes anxiety that is not necessarily related to specific events or activities. It also causes excess anxiety in proportion to specific events or activities. The anxiety associated with GAD is also difficult to control. GAD can vary from mild to severe. People with severe GAD can have intense waves of anxiety with physical symptoms (panic attacks).  SYMPTOMS The anxiety and worry associated with GAD are difficult to control. This anxiety and worry are related to many life events and activities and also occur more days than not for 6 months or longer. People with GAD also have three or more of the following symptoms (one or more in children):  Restlessness.   Fatigue.  Difficulty concentrating.   Irritability.  Muscle tension.  Difficulty sleeping or unsatisfying sleep. DIAGNOSIS GAD is diagnosed through an assessment by your health care provider. Your health care provider will ask you questions aboutyour mood,physical symptoms, and events in your life. Your health care provider may ask you about your medical history and use of alcohol or drugs, including prescription medicines. Your health care provider may also do a physical exam and blood tests. Certain medical conditions and the use of certain substances can cause symptoms similar to those associated with GAD. Your health care provider may refer you to a mental health specialist for further evaluation. TREATMENT If the lesion on your right thigh does not clear up in 2 weeks  then return to dermatology for further evaluation   The following therapies are usually used to treat GAD:       Medication. Antidepressant medication usually is prescribed for long-term daily control. Antianxiety medicines may be added in severe cases, especially when panic attacks occur.   Talk therapy (psychotherapy). Certain types of talk therapy can be helpful in treating GAD by providing support, education, and guidance. A form of talk therapy called cognitive behavioral therapy can teach you healthy ways to think about and react to daily life events and activities.  Stress managementtechniques. These include yoga, meditation, and exercise and can be very helpful when they are practiced regularly. A mental health specialist can help determine which treatment is best for you. Some people see improvement with one therapy. However, other people require a combination of therapies. Document Released: 10/09/2012 Document Revised: 10/29/2013 Document Reviewed: 10/09/2012 [facility] Patient Information 2015 [place], [place]. This information is not intended to replace advice given to you by your health care provider. Make sure you discuss any questions you have with your health care provider.

## 2014-09-17 NOTE — Progress Notes (Signed)
Pre visit review using our clinic review tool, if applicable. No additional management support is needed unless otherwise documented below in the visit note. 

## 2014-09-26 ENCOUNTER — Encounter: Payer: Self-pay | Admitting: Family Medicine

## 2014-09-26 DIAGNOSIS — H60541 Acute eczematoid otitis externa, right ear: Secondary | ICD-10-CM | POA: Insufficient documentation

## 2014-09-26 NOTE — Assessment & Plan Note (Signed)
On Levothyroxine, continue to monitor 

## 2014-09-26 NOTE — Assessment & Plan Note (Signed)
Encouraged increased hydration and fiber in diet. Daily probiotics. If bowels not moving can use MOM 2 tbls po in 4 oz of warm prune juice by mouth every 2-3 days. If no results then repeat in 4 hours with  Dulcolax suppository pr, may repeat again in 4 more hours as needed. Seek care if symptoms worsen. Consider daily Miralax and/or Dulcolax if symptoms persist.  

## 2014-09-26 NOTE — Assessment & Plan Note (Addendum)
Will try Paroxetine at 10 mg daily

## 2014-09-26 NOTE — Assessment & Plan Note (Signed)
Improving symptoms, dyspnea is improved somewhat.
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
## 2014-09-26 NOTE — Assessment & Plan Note (Signed)
Avoid offending foods, start probiotics. Do not eat large meals in late evening and consider raising head of bed.  

## 2014-10-05 ENCOUNTER — Other Ambulatory Visit: Payer: Self-pay | Admitting: Family Medicine

## 2014-10-07 NOTE — Telephone Encounter (Signed)
Faxed hardcopy for Alprazolam to [facility]

## 2014-11-07 ENCOUNTER — Ambulatory Visit: Payer: Medicare Other | Admitting: Family Medicine

## 2014-11-20 ENCOUNTER — Other Ambulatory Visit: Payer: Self-pay | Admitting: Family Medicine

## 2014-11-20 MED ORDER — BUPROPION HCL ER (XL) 300 MG PO TB24: 300.0000 mg | ORAL_TABLET | Freq: Every day | ORAL | Status: DC

## 2014-11-26 ENCOUNTER — Ambulatory Visit: Payer: Medicare Other | Admitting: Family Medicine

## 2014-11-28 ENCOUNTER — Telehealth: Payer: Self-pay | Admitting: Family Medicine

## 2014-11-28 ENCOUNTER — Encounter: Payer: Self-pay | Admitting: Family Medicine

## 2014-11-28 NOTE — Telephone Encounter (Signed)
Pt was no show for appt on 5/12 and then again on 5/31- letter sent to patient charge ?

## 2014-11-28 NOTE — Telephone Encounter (Signed)
Yes send note and charge.

## 2014-12-03 ENCOUNTER — Telehealth: Payer: Self-pay | Admitting: Family Medicine

## 2014-12-03 ENCOUNTER — Encounter: Payer: Self-pay | Admitting: Family Medicine

## 2014-12-03 NOTE — Telephone Encounter (Signed)
OK to charge if she has a 3rd no show let me know

## 2014-12-03 NOTE — Telephone Encounter (Signed)
Pt was no show for appointment on 5/31- letter sent. 2nd no show in May.

## 2014-12-19 DIAGNOSIS — Z4789 Encounter for other orthopedic aftercare: Secondary | ICD-10-CM | POA: Diagnosis not present

## 2014-12-19 DIAGNOSIS — M1712 Unilateral primary osteoarthritis, left knee: Secondary | ICD-10-CM | POA: Diagnosis not present

## 2014-12-27 DIAGNOSIS — M1712 Unilateral primary osteoarthritis, left knee: Secondary | ICD-10-CM | POA: Diagnosis not present

## 2015-01-07 ENCOUNTER — Encounter: Payer: Self-pay | Admitting: Genetic Counselor

## 2015-01-07 ENCOUNTER — Telehealth: Payer: Self-pay | Admitting: Family Medicine

## 2015-01-07 NOTE — Telephone Encounter (Signed)
Requesting: alprazolam Contract  none UDS  none Last OV  09/17/14 Last Refill   #90 with 1 refill on 10/07/14  Please Advise'

## 2015-01-08 DIAGNOSIS — M1712 Unilateral primary osteoarthritis, left knee: Secondary | ICD-10-CM | POA: Diagnosis not present

## 2015-01-09 MED ORDER — ALPRAZOLAM 0.5 MG PO TABS: ORAL_TABLET | ORAL | Status: DC

## 2015-01-09 NOTE — Telephone Encounter (Signed)
If this was approved on Tuesday I have no record where it was printed/faxed in. Ok to reprint and fax?? Pharmacy fax states they do not have.

## 2015-01-09 NOTE — Telephone Encounter (Signed)
OK to reprint and fax

## 2015-01-09 NOTE — Telephone Encounter (Signed)
Printed and on counter for signature. 

## 2015-01-09 NOTE — Telephone Encounter (Signed)
Once signed faxed hardcopy for [facility]

## 2015-01-09 NOTE — Addendum Note (Signed)
Addended by: Sharon Seller B on: 01/09/2015 01:41 PM   Modules accepted: Orders

## 2015-01-10 ENCOUNTER — Ambulatory Visit (INDEPENDENT_AMBULATORY_CARE_PROVIDER_SITE_OTHER): Payer: Medicare Other | Admitting: Family Medicine

## 2015-01-10 ENCOUNTER — Encounter: Payer: Self-pay | Admitting: Family Medicine

## 2015-01-10 VITALS — BP 142/86 | HR 99 | Temp 98.6°F | Ht 65.5 in | Wt 163.5 lb

## 2015-01-10 DIAGNOSIS — E039 Hypothyroidism, unspecified: Secondary | ICD-10-CM

## 2015-01-10 DIAGNOSIS — I10 Essential (primary) hypertension: Secondary | ICD-10-CM | POA: Diagnosis not present

## 2015-01-10 DIAGNOSIS — R197 Diarrhea, unspecified: Secondary | ICD-10-CM

## 2015-01-10 DIAGNOSIS — K219 Gastro-esophageal reflux disease without esophagitis: Secondary | ICD-10-CM

## 2015-01-10 DIAGNOSIS — E785 Hyperlipidemia, unspecified: Secondary | ICD-10-CM

## 2015-01-10 DIAGNOSIS — R1032 Left lower quadrant pain: Secondary | ICD-10-CM | POA: Diagnosis not present

## 2015-01-10 LAB — CBC
HCT: 41.4 % (ref 36.0–46.0)
Hemoglobin: 14 g/dL (ref 12.0–15.0)
MCH: 31 pg (ref 26.0–34.0)
MCHC: 33.8 g/dL (ref 30.0–36.0)
MCV: 91.6 fL (ref 78.0–100.0)
MPV: 9.6 fL (ref 8.6–12.4)
Platelets: 281 10*3/uL (ref 150–400)
RBC: 4.52 MIL/uL (ref 3.87–5.11)
RDW: 13.8 % (ref 11.5–15.5)
WBC: 10.8 10*3/uL — ABNORMAL HIGH (ref 4.0–10.5)

## 2015-01-10 NOTE — Progress Notes (Signed)
Pre visit review using our clinic review tool, if applicable. No additional management support is needed unless otherwise documented below in the visit note. 

## 2015-01-10 NOTE — Patient Instructions (Signed)

## 2015-01-11 LAB — COMPREHENSIVE METABOLIC PANEL
ALT: 20 U/L (ref 0–35)
AST: 30 U/L (ref 0–37)
Albumin: 4.2 g/dL (ref 3.5–5.2)
Alkaline Phosphatase: 94 U/L (ref 39–117)
BUN: 27 mg/dL — ABNORMAL HIGH (ref 6–23)
CO2: 21 mEq/L (ref 19–32)
Calcium: 9.2 mg/dL (ref 8.4–10.5)
Chloride: 104 mEq/L (ref 96–112)
Creat: 0.87 mg/dL (ref 0.50–1.10)
Glucose, Bld: 96 mg/dL (ref 70–99)
Potassium: 3.8 mEq/L (ref 3.5–5.3)
Sodium: 138 mEq/L (ref 135–145)
Total Bilirubin: 0.4 mg/dL (ref 0.2–1.2)
Total Protein: 6.7 g/dL (ref 6.0–8.3)

## 2015-01-11 LAB — SEDIMENTATION RATE: Sed Rate: 4 mm/hr (ref 0–30)

## 2015-01-11 LAB — TSH: TSH: 1.181 u[IU]/mL (ref 0.350–4.500)

## 2015-01-15 ENCOUNTER — Ambulatory Visit (HOSPITAL_BASED_OUTPATIENT_CLINIC_OR_DEPARTMENT_OTHER)
Admission: RE | Admit: 2015-01-15 | Discharge: 2015-01-15 | Disposition: A | Discharge status: Home / Self Care | Payer: Medicare Other | Source: Ambulatory Visit | Attending: Family Medicine | Admitting: Family Medicine

## 2015-01-15 ENCOUNTER — Encounter (HOSPITAL_BASED_OUTPATIENT_CLINIC_OR_DEPARTMENT_OTHER): Payer: Self-pay

## 2015-01-15 DIAGNOSIS — R10814 Left lower quadrant abdominal tenderness: Secondary | ICD-10-CM | POA: Diagnosis not present

## 2015-01-15 DIAGNOSIS — R1032 Left lower quadrant pain: Secondary | ICD-10-CM | POA: Diagnosis not present

## 2015-01-15 DIAGNOSIS — Z853 Personal history of malignant neoplasm of breast: Secondary | ICD-10-CM | POA: Diagnosis not present

## 2015-01-15 DIAGNOSIS — R197 Diarrhea, unspecified: Secondary | ICD-10-CM

## 2015-01-15 DIAGNOSIS — N8189 Other female genital prolapse: Secondary | ICD-10-CM | POA: Insufficient documentation

## 2015-01-15 DIAGNOSIS — I10 Essential (primary) hypertension: Secondary | ICD-10-CM

## 2015-01-15 MED ORDER — IOHEXOL 300 MG/ML  SOLN
100.0000 mL | Freq: Once | INTRAMUSCULAR | Status: AC | PRN
  Administered 2015-01-15: 100 mL via INTRAVENOUS

## 2015-01-22 ENCOUNTER — Telehealth: Payer: Self-pay | Admitting: Family Medicine

## 2015-01-22 NOTE — Telephone Encounter (Signed)
°  Relation to VX:UCJA  Call back [facility]  Reason for call:  Patient still feels constipated last seen 01/10/2015, patient had ct-scan 01/15/15. Pt in need of clinical advice

## 2015-01-22 NOTE — Telephone Encounter (Signed)
Patient states she has had a small BM today - is doing Miralax and prune juice since last visit with Dr. Charlett Blake (01/10/15) for abdominal pain.  She states she is still having the abdominal pain which is somewhat relieved with BM.  Her stool is thin and normally a small amount.  She is not currently having any nausea.  She would like to know if she could try a fleet enema.  Please advise.

## 2015-01-22 NOTE — Telephone Encounter (Signed)
Ok to do once but I do not recommend repeating. If still no bowel movement she needs to be seen.

## 2015-01-23 NOTE — Telephone Encounter (Signed)
Notified patient and she stated understanding.  She states if she does not get relief she will call and schedule another office visit.

## 2015-01-27 ENCOUNTER — Ambulatory Visit (INDEPENDENT_AMBULATORY_CARE_PROVIDER_SITE_OTHER): Payer: Medicare Other | Admitting: Family

## 2015-01-27 ENCOUNTER — Encounter: Payer: Self-pay | Admitting: Family

## 2015-01-27 ENCOUNTER — Telehealth: Payer: Self-pay | Admitting: Family

## 2015-01-27 ENCOUNTER — Ambulatory Visit (HOSPITAL_BASED_OUTPATIENT_CLINIC_OR_DEPARTMENT_OTHER)
Admission: RE | Admit: 2015-01-27 | Discharge: 2015-01-27 | Disposition: A | Discharge status: Home / Self Care | Payer: Medicare Other | Source: Ambulatory Visit | Attending: Family | Admitting: Family

## 2015-01-27 VITALS — BP 108/70 | HR 72 | Temp 97.9°F | Resp 16 | Ht 65.5 in | Wt 166.6 lb

## 2015-01-27 DIAGNOSIS — D72829 Elevated white blood cell count, unspecified: Secondary | ICD-10-CM

## 2015-01-27 DIAGNOSIS — R1032 Left lower quadrant pain: Secondary | ICD-10-CM | POA: Insufficient documentation

## 2015-01-27 DIAGNOSIS — K5900 Constipation, unspecified: Secondary | ICD-10-CM | POA: Insufficient documentation

## 2015-01-27 NOTE — Telephone Encounter (Signed)
X ray notes prominent stool throughout colon. WBC is still pending.  I would advise the following - no enema.  But I would recommend magnesium citrate- 1/2 bottle now, 1/2 bottle in 6 hours if no BM.  Call if no improvement in symptoms after completing mag citrate.

## 2015-01-27 NOTE — Assessment & Plan Note (Signed)
Unchanged. KUB is obtained and again notes retained stool throughout the colon. Advise pt on trial of mag citrate.  Await follow up WBC.  If rising consider addition of abx.
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
## 2015-01-27 NOTE — Assessment & Plan Note (Signed)
Encouraged heart healthy diet, increase exercise, avoid trans fats, consider a krill oil cap daily 

## 2015-01-27 NOTE — Patient Instructions (Addendum)
Please complete lab work prior to leaving. Complete x ray on the first floor.  Continue fresh fruits, veggies and water intake. Continue daily miralax. Call if symptoms worsen or if symptoms do not improve.

## 2015-01-27 NOTE — Assessment & Plan Note (Signed)
Very mild elevation of WBC, CT of abdomen only significant for constipation. Encouraged increased hydration and fiber in diet. Daily probiotics. If bowels not moving can use MOM 2 tbls po in 4 oz of warm prune juice by mouth every 2-3 days. If no results then repeat in 4 hours with  Dulcolax suppository pr, may repeat again in 4 more hours as needed. Seek care if symptoms worsen. Consider daily Miralax and/or Dulcolax if symptoms persist.
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
## 2015-01-27 NOTE — Progress Notes (Signed)
Pre visit review using our clinic review tool, if applicable. No additional management support is needed unless otherwise documented below in the visit note. 

## 2015-01-27 NOTE — Telephone Encounter (Signed)
Notified pt and she voices understanding. 

## 2015-01-27 NOTE — Assessment & Plan Note (Addendum)
Avoid offending foods, take probiotics. Do not eat large meals in late evening and consider raising head of bed.  

## 2015-01-27 NOTE — Assessment & Plan Note (Signed)
On Levothyroxine, continue to monitor 

## 2015-01-28 LAB — CBC WITH DIFFERENTIAL/PLATELET
Basophils Absolute: 0 10*3/uL (ref 0.0–0.1)
Basophils Relative: 0.3 % (ref 0.0–3.0)
Eosinophils Absolute: 0.1 10*3/uL (ref 0.0–0.7)
Eosinophils Relative: 2.3 % (ref 0.0–5.0)
HCT: 41.3 % (ref 36.0–46.0)
Hemoglobin: 13.7 g/dL (ref 12.0–15.0)
Lymphocytes Relative: 27.9 % (ref 12.0–46.0)
Lymphs Abs: 1.7 10*3/uL (ref 0.7–4.0)
MCHC: 33.3 g/dL (ref 30.0–36.0)
MCV: 95.6 fl (ref 78.0–100.0)
Monocytes Absolute: 0.5 10*3/uL (ref 0.1–1.0)
Monocytes Relative: 8 % (ref 3.0–12.0)
Neutro Abs: 3.7 10*3/uL (ref 1.4–7.7)
Neutrophils Relative %: 61.5 % (ref 43.0–77.0)
Platelets: 228 10*3/uL (ref 150.0–400.0)
RBC: 4.32 Mil/uL (ref 3.87–5.11)
RDW: 13.9 % (ref 11.5–15.5)
WBC: 6 10*3/uL (ref 4.0–10.5)

## 2015-01-29 NOTE — Telephone Encounter (Signed)
Notified pt and she voices understanding. 

## 2015-01-29 NOTE — Telephone Encounter (Signed)
Pt called back and states she only had 3 small bowel movements after drinking the whole bottle of magnesium citrate. Still doesn't feel like she has had much relief and wants to know what else she should do?

## 2015-01-29 NOTE — Telephone Encounter (Signed)
Continue miralax, can try fleets enema x 1.

## 2015-01-30 ENCOUNTER — Encounter: Payer: Self-pay | Admitting: Family

## 2015-01-30 DIAGNOSIS — K5909 Other constipation: Secondary | ICD-10-CM

## 2015-01-30 MED ORDER — LUBIPROSTONE 8 MCG PO CAPS: 8.0000 ug | ORAL_CAPSULE | Freq: Two times a day (BID) | ORAL | Status: DC

## 2015-01-31 ENCOUNTER — Encounter: Payer: Self-pay | Admitting: Family

## 2015-01-31 ENCOUNTER — Other Ambulatory Visit: Payer: Self-pay

## 2015-01-31 DIAGNOSIS — Z1231 Encounter for screening mammogram for malignant neoplasm of breast: Secondary | ICD-10-CM

## 2015-01-31 NOTE — Telephone Encounter (Signed)
Marj,  She is already established with Dr. Hassell Done at [facility].  Could you please try to get her in with him? thanks

## 2015-02-10 ENCOUNTER — Telehealth: Payer: Self-pay | Admitting: Behavioral Health

## 2015-02-10 NOTE — Telephone Encounter (Signed)
Unable to reach patient at time of Pre-Visit Call.  Left message for patient to return call when available.    

## 2015-02-11 ENCOUNTER — Encounter: Payer: Self-pay | Admitting: Family Medicine

## 2015-02-11 ENCOUNTER — Ambulatory Visit (INDEPENDENT_AMBULATORY_CARE_PROVIDER_SITE_OTHER): Payer: Medicare Other | Admitting: Family Medicine

## 2015-02-11 VITALS — BP 110/76 | HR 81 | Temp 98.5°F | Ht 65.5 in | Wt 166.4 lb

## 2015-02-11 DIAGNOSIS — R109 Unspecified abdominal pain: Secondary | ICD-10-CM | POA: Diagnosis not present

## 2015-02-11 DIAGNOSIS — E039 Hypothyroidism, unspecified: Secondary | ICD-10-CM

## 2015-02-11 DIAGNOSIS — F418 Other specified anxiety disorders: Secondary | ICD-10-CM | POA: Diagnosis not present

## 2015-02-11 DIAGNOSIS — E782 Mixed hyperlipidemia: Secondary | ICD-10-CM | POA: Diagnosis not present

## 2015-02-11 DIAGNOSIS — Z0000 Encounter for general adult medical examination without abnormal findings: Secondary | ICD-10-CM | POA: Diagnosis not present

## 2015-02-11 DIAGNOSIS — K5900 Constipation, unspecified: Secondary | ICD-10-CM | POA: Diagnosis not present

## 2015-02-11 DIAGNOSIS — E785 Hyperlipidemia, unspecified: Secondary | ICD-10-CM

## 2015-02-11 DIAGNOSIS — G4700 Insomnia, unspecified: Secondary | ICD-10-CM

## 2015-02-11 NOTE — Assessment & Plan Note (Signed)
Encouraged increased hydration and fiber in diet. Daily probiotics. If bowels not moving can use MOM 2 tbls po in 4 oz of warm prune juice by mouth every 2-3 days. If no results then repeat in 4 hours with  Dulcolax suppository pr, may repeat again in 4 more hours as needed. Seek care if symptoms worsen. Consider daily Miralax and/or Dulcolax if symptoms persist.  

## 2015-02-11 NOTE — Assessment & Plan Note (Signed)
Doing well on current meds, no changes

## 2015-02-11 NOTE — Progress Notes (Signed)
Pre visit review using our clinic review tool, if applicable. No additional management support is needed unless otherwise documented below in the visit note. 

## 2015-02-11 NOTE — Patient Instructions (Signed)
Preventive Care for Adults A healthy lifestyle and preventive care can promote health and wellness. Preventive health guidelines for women include the following key practices.  A routine yearly physical is a good way to check with your health care provider about your health and preventive screening. It is a chance to share any concerns and updates on your health and to receive a thorough exam.  Visit your dentist for a routine exam and preventive care every 6 months. Brush your teeth twice a day and floss once a day. Good oral hygiene prevents tooth decay and gum disease.  The frequency of eye exams is based on your age, health, family medical history, use of contact lenses, and other factors. Follow your health care provider's recommendations for frequency of eye exams.  Eat a healthy diet. Foods like vegetables, fruits, whole grains, low-fat dairy products, and lean protein foods contain the nutrients you need without too many calories. Decrease your intake of foods high in solid fats, added sugars, and salt. Eat the right amount of calories for you.Get information about a proper diet from your health care provider, if necessary.  Regular physical exercise is one of the most important things you can do for your health. Most adults should get at least 150 minutes of moderate-intensity exercise (any activity that increases your heart rate and causes you to sweat) each week. In addition, most adults need muscle-strengthening exercises on 2 or more days a week.  Maintain a healthy weight. The body mass index (BMI) is a screening tool to identify possible weight problems. It provides an estimate of body fat based on height and weight. Your health care provider can find your BMI and can help you achieve or maintain a healthy weight.For adults 20 years and older:  A BMI below 18.5 is considered underweight.  A BMI of 18.5 to 24.9 is normal.  A BMI of 25 to 29.9 is considered overweight.  A BMI of  30 and above is considered obese.  Maintain normal blood lipids and cholesterol levels by exercising and minimizing your intake of saturated fat. Eat a balanced diet with plenty of fruit and vegetables. Blood tests for lipids and cholesterol should begin at age 76 and be repeated every 5 years. If your lipid or cholesterol levels are high, you are over 50, or you are at high risk for heart disease, you may need your cholesterol levels checked more frequently.Ongoing high lipid and cholesterol levels should be treated with medicines if diet and exercise are not working.  If you smoke, find out from your health care provider how to quit. If you do not use tobacco, do not start.  Lung cancer screening is recommended for adults aged 22-80 years who are at high risk for developing lung cancer because of a history of smoking. A yearly low-dose CT scan of the lungs is recommended for people who have at least a 30-pack-year history of smoking and are a current smoker or have quit within the past 15 years. A pack year of smoking is smoking an average of 1 pack of cigarettes a day for 1 year (for example: 1 pack a day for 30 years or 2 packs a day for 15 years). Yearly screening should continue until the smoker has stopped smoking for at least 15 years. Yearly screening should be stopped for people who develop a health problem that would prevent them from having lung cancer treatment.  If you are pregnant, do not drink alcohol. If you are breastfeeding,  be very cautious about drinking alcohol. If you are not pregnant and choose to drink alcohol, do not have more than 1 drink per day. One drink is considered to be 12 ounces (355 mL) of beer, 5 ounces (148 mL) of wine, or 1.5 ounces (44 mL) of liquor.  Avoid use of street drugs. Do not share needles with anyone. Ask for help if you need support or instructions about stopping the use of drugs.  High blood pressure causes heart disease and increases the risk of  stroke. Your blood pressure should be checked at least every 1 to 2 years. Ongoing high blood pressure should be treated with medicines if weight loss and exercise do not work.  If you are 75-52 years old, ask your health care provider if you should take aspirin to prevent strokes.  Diabetes screening involves taking a blood sample to check your fasting blood sugar level. This should be done once every 3 years, after age 15, if you are within normal weight and without risk factors for diabetes. Testing should be considered at a younger age or be carried out more frequently if you are overweight and have at least 1 risk factor for diabetes.  Breast cancer screening is essential preventive care for women. You should practice "breast self-awareness." This means understanding the normal appearance and feel of your breasts and may include breast self-examination. Any changes detected, no matter how small, should be reported to a health care provider. Women in their 58s and 30s should have a clinical breast exam (CBE) by a health care provider as part of a regular health exam every 1 to 3 years. After age 16, women should have a CBE every year. Starting at age 53, women should consider having a mammogram (breast X-ray test) every year. Women who have a family history of breast cancer should talk to their health care provider about genetic screening. Women at a high risk of breast cancer should talk to their health care providers about having an MRI and a mammogram every year.  Breast cancer gene (BRCA)-related cancer risk assessment is recommended for women who have family members with BRCA-related cancers. BRCA-related cancers include breast, ovarian, tubal, and peritoneal cancers. Having family members with these cancers may be associated with an increased risk for harmful changes (mutations) in the breast cancer genes BRCA1 and BRCA2. Results of the assessment will determine the need for genetic counseling and  BRCA1 and BRCA2 testing.  Routine pelvic exams to screen for cancer are no longer recommended for nonpregnant women who are considered low risk for cancer of the pelvic organs (ovaries, uterus, and vagina) and who do not have symptoms. Ask your health care provider if a screening pelvic exam is right for you.  If you have had past treatment for cervical cancer or a condition that could lead to cancer, you need Pap tests and screening for cancer for at least 20 years after your treatment. If Pap tests have been discontinued, your risk factors (such as having a new sexual partner) need to be reassessed to determine if screening should be resumed. Some women have medical problems that increase the chance of getting cervical cancer. In these cases, your health care provider may recommend more frequent screening and Pap tests.  The HPV test is an additional test that may be used for cervical cancer screening. The HPV test looks for the virus that can cause the cell changes on the cervix. The cells collected during the Pap test can be  tested for HPV. The HPV test could be used to screen women aged 30 years and older, and should be used in women of any age who have unclear Pap test results. After the age of 30, women should have HPV testing at the same frequency as a Pap test.  Colorectal cancer can be detected and often prevented. Most routine colorectal cancer screening begins at the age of 50 years and continues through age 75 years. However, your health care provider may recommend screening at an earlier age if you have risk factors for colon cancer. On a yearly basis, your health care provider may provide home test kits to check for hidden blood in the stool. Use of a small camera at the end of a tube, to directly examine the colon (sigmoidoscopy or colonoscopy), can detect the earliest forms of colorectal cancer. Talk to your health care provider about this at age 50, when routine screening begins. Direct  exam of the colon should be repeated every 5-10 years through age 75 years, unless early forms of pre-cancerous polyps or small growths are found.  People who are at an increased risk for hepatitis B should be screened for this virus. You are considered at high risk for hepatitis B if:  You were born in a country where hepatitis B occurs often. Talk with your health care provider about which countries are considered high risk.  Your parents were born in a high-risk country and you have not received a shot to protect against hepatitis B (hepatitis B vaccine).  You have HIV or AIDS.  You use needles to inject street drugs.  You live with, or have sex with, someone who has hepatitis B.  You get hemodialysis treatment.  You take certain medicines for conditions like cancer, organ transplantation, and autoimmune conditions.  Hepatitis C blood testing is recommended for all people born from 1945 through 1965 and any individual with known risks for hepatitis C.  Practice safe sex. Use condoms and avoid high-risk sexual practices to reduce the spread of sexually transmitted infections (STIs). STIs include gonorrhea, chlamydia, syphilis, trichomonas, herpes, HPV, and human immunodeficiency virus (HIV). Herpes, HIV, and HPV are viral illnesses that have no cure. They can result in disability, cancer, and death.  You should be screened for sexually transmitted illnesses (STIs) including gonorrhea and chlamydia if:  You are sexually active and are younger than 24 years.  You are older than 24 years and your health care provider tells you that you are at risk for this type of infection.  Your sexual activity has changed since you were last screened and you are at an increased risk for chlamydia or gonorrhea. Ask your health care provider if you are at risk.  If you are at risk of being infected with HIV, it is recommended that you take a prescription medicine daily to prevent HIV infection. This is  called preexposure prophylaxis (PrEP). You are considered at risk if:  You are a heterosexual woman, are sexually active, and are at increased risk for HIV infection.  You take drugs by injection.  You are sexually active with a partner who has HIV.  Talk with your health care provider about whether you are at high risk of being infected with HIV. If you choose to begin PrEP, you should first be tested for HIV. You should then be tested every 3 months for as long as you are taking PrEP.  Osteoporosis is a disease in which the bones lose minerals and strength   with aging. This can result in serious bone fractures or breaks. The risk of osteoporosis can be identified using a bone density scan. Women ages 65 years and over and women at risk for fractures or osteoporosis should discuss screening with their health care providers. Ask your health care provider whether you should take a calcium supplement or vitamin D to reduce the rate of osteoporosis.  Menopause can be associated with physical symptoms and risks. Hormone replacement therapy is available to decrease symptoms and risks. You should talk to your health care provider about whether hormone replacement therapy is right for you.  Use sunscreen. Apply sunscreen liberally and repeatedly throughout the day. You should seek shade when your shadow is shorter than you. Protect yourself by wearing long sleeves, pants, a wide-brimmed hat, and sunglasses year round, whenever you are outdoors.  Once a month, do a whole body skin exam, using a mirror to look at the skin on your back. Tell your health care provider of new moles, moles that have irregular borders, moles that are larger than a pencil eraser, or moles that have changed in shape or color.  Stay current with required vaccines (immunizations).  Influenza vaccine. All adults should be immunized every year.  Tetanus, diphtheria, and acellular pertussis (Td, Tdap) vaccine. Pregnant women should  receive 1 dose of Tdap vaccine during each pregnancy. The dose should be obtained regardless of the length of time since the last dose. Immunization is preferred during the 27th-36th week of gestation. An adult who has not previously received Tdap or who does not know her vaccine status should receive 1 dose of Tdap. This initial dose should be followed by tetanus and diphtheria toxoids (Td) booster doses every 10 years. Adults with an unknown or incomplete history of completing a 3-dose immunization series with Td-containing vaccines should begin or complete a primary immunization series including a Tdap dose. Adults should receive a Td booster every 10 years.  Varicella vaccine. An adult without evidence of immunity to varicella should receive 2 doses or a second dose if she has previously received 1 dose. Pregnant females who do not have evidence of immunity should receive the first dose after pregnancy. This first dose should be obtained before leaving the health care facility. The second dose should be obtained 4-8 weeks after the first dose.  Human papillomavirus (HPV) vaccine. Females aged 13-26 years who have not received the vaccine previously should obtain the 3-dose series. The vaccine is not recommended for use in pregnant females. However, pregnancy testing is not needed before receiving a dose. If a female is found to be pregnant after receiving a dose, no treatment is needed. In that case, the remaining doses should be delayed until after the pregnancy. Immunization is recommended for any person with an immunocompromised condition through the age of 26 years if she did not get any or all doses earlier. During the 3-dose series, the second dose should be obtained 4-8 weeks after the first dose. The third dose should be obtained 24 weeks after the first dose and 16 weeks after the second dose.  Zoster vaccine. One dose is recommended for adults aged 60 years or older unless certain conditions are  present.  Measles, mumps, and rubella (MMR) vaccine. Adults born before 1957 generally are considered immune to measles and mumps. Adults born in 1957 or later should have 1 or more doses of MMR vaccine unless there is a contraindication to the vaccine or there is laboratory evidence of immunity to   each of the three diseases. A routine second dose of MMR vaccine should be obtained at least 28 days after the first dose for students attending postsecondary schools, health care workers, or international travelers. People who received inactivated measles vaccine or an unknown type of measles vaccine during 1963-1967 should receive 2 doses of MMR vaccine. People who received inactivated mumps vaccine or an unknown type of mumps vaccine before 1979 and are at high risk for mumps infection should consider immunization with 2 doses of MMR vaccine. For females of childbearing age, rubella immunity should be determined. If there is no evidence of immunity, females who are not pregnant should be vaccinated. If there is no evidence of immunity, females who are pregnant should delay immunization until after pregnancy. Unvaccinated health care workers born before 1957 who lack laboratory evidence of measles, mumps, or rubella immunity or laboratory confirmation of disease should consider measles and mumps immunization with 2 doses of MMR vaccine or rubella immunization with 1 dose of MMR vaccine.  Pneumococcal 13-valent conjugate (PCV13) vaccine. When indicated, a person who is uncertain of her immunization history and has no record of immunization should receive the PCV13 vaccine. An adult aged 19 years or older who has certain medical conditions and has not been previously immunized should receive 1 dose of PCV13 vaccine. This PCV13 should be followed with a dose of pneumococcal polysaccharide (PPSV23) vaccine. The PPSV23 vaccine dose should be obtained at least 8 weeks after the dose of PCV13 vaccine. An adult aged 19  years or older who has certain medical conditions and previously received 1 or more doses of PPSV23 vaccine should receive 1 dose of PCV13. The PCV13 vaccine dose should be obtained 1 or more years after the last PPSV23 vaccine dose.  Pneumococcal polysaccharide (PPSV23) vaccine. When PCV13 is also indicated, PCV13 should be obtained first. All adults aged 65 years and older should be immunized. An adult younger than age 65 years who has certain medical conditions should be immunized. Any person who resides in a nursing home or long-term care facility should be immunized. An adult smoker should be immunized. People with an immunocompromised condition and certain other conditions should receive both PCV13 and PPSV23 vaccines. People with human immunodeficiency virus (HIV) infection should be immunized as soon as possible after diagnosis. Immunization during chemotherapy or radiation therapy should be avoided. Routine use of PPSV23 vaccine is not recommended for American Indians, [place] Natives, or people younger than 65 years unless there are medical conditions that require PPSV23 vaccine. When indicated, people who have unknown immunization and have no record of immunization should receive PPSV23 vaccine. One-time revaccination 5 years after the first dose of PPSV23 is recommended for people aged 19-64 years who have chronic kidney failure, nephrotic syndrome, asplenia, or immunocompromised conditions. People who received 1-2 doses of PPSV23 before age 65 years should receive another dose of PPSV23 vaccine at age 65 years or later if at least 5 years have passed since the previous dose. Doses of PPSV23 are not needed for people immunized with PPSV23 at or after age 65 years.  Meningococcal vaccine. Adults with asplenia or persistent complement component deficiencies should receive 2 doses of quadrivalent meningococcal conjugate (MenACWY-D) vaccine. The doses should be obtained at least 2 months apart.  Microbiologists working with certain meningococcal bacteria, military recruits, people at risk during an outbreak, and people who travel to or live in countries with a high rate of meningitis should be immunized. A first-year college student up through age   21 years who is living in a residence hall should receive a dose if she did not receive a dose on or after her 16th birthday. Adults who have certain high-risk conditions should receive one or more doses of vaccine.  Hepatitis A vaccine. Adults who wish to be protected from this disease, have certain high-risk conditions, work with hepatitis A-infected animals, work in hepatitis A research labs, or travel to or work in countries with a high rate of hepatitis A should be immunized. Adults who were previously unvaccinated and who anticipate close contact with an international adoptee during the first 60 days after arrival in the [place] from a country with a high rate of hepatitis A should be immunized.  Hepatitis B vaccine. Adults who wish to be protected from this disease, have certain high-risk conditions, may be exposed to blood or other infectious body fluids, are household contacts or sex partners of hepatitis B positive people, are clients or workers in certain care facilities, or travel to or work in countries with a high rate of hepatitis B should be immunized.  Haemophilus influenzae type b (Hib) vaccine. A previously unvaccinated person with asplenia or sickle cell disease or having a scheduled splenectomy should receive 1 dose of Hib vaccine. Regardless of previous immunization, a recipient of a hematopoietic stem cell transplant should receive a 3-dose series 6-12 months after her successful transplant. Hib vaccine is not recommended for adults with HIV infection. Preventive Services / Frequency Ages 64 to 68 years  Blood pressure check.** / Every 1 to 2 years.  Lipid and cholesterol check.** / Every 5 years beginning at age  22.  Clinical breast exam.** / Every 3 years for women in their 88s and 53s.  BRCA-related cancer risk assessment.** / For women who have family members with a BRCA-related cancer (breast, ovarian, tubal, or peritoneal cancers).  Pap test.** / Every 2 years from ages 90 through 51. Every 3 years starting at age 21 through age 56 or 3 with a history of 3 consecutive normal Pap tests.  HPV screening.** / Every 3 years from ages 24 through ages 1 to 46 with a history of 3 consecutive normal Pap tests.  Hepatitis C blood test.** / For any individual with known risks for hepatitis C.  Skin self-exam. / Monthly.  Influenza vaccine. / Every year.  Tetanus, diphtheria, and acellular pertussis (Tdap, Td) vaccine.** / Consult your health care provider. Pregnant women should receive 1 dose of Tdap vaccine during each pregnancy. 1 dose of Td every 10 years.  Varicella vaccine.** / Consult your health care provider. Pregnant females who do not have evidence of immunity should receive the first dose after pregnancy.  HPV vaccine. / 3 doses over 6 months, if 72 and younger. The vaccine is not recommended for use in pregnant females. However, pregnancy testing is not needed before receiving a dose.  Measles, mumps, rubella (MMR) vaccine.** / You need at least 1 dose of MMR if you were born in 1957 or later. You may also need a 2nd dose. For females of childbearing age, rubella immunity should be determined. If there is no evidence of immunity, females who are not pregnant should be vaccinated. If there is no evidence of immunity, females who are pregnant should delay immunization until after pregnancy.  Pneumococcal 13-valent conjugate (PCV13) vaccine.** / Consult your health care provider.  Pneumococcal polysaccharide (PPSV23) vaccine.** / 1 to 2 doses if you smoke cigarettes or if you have certain conditions.  Meningococcal vaccine.** /  1 dose if you are age 19 to 21 years and a first-year college  student living in a residence hall, or have one of several medical conditions, you need to get vaccinated against meningococcal disease. You may also need additional booster doses.  Hepatitis A vaccine.** / Consult your health care provider.  Hepatitis B vaccine.** / Consult your health care provider.  Haemophilus influenzae type b (Hib) vaccine.** / Consult your health care provider. Ages 40 to 64 years  Blood pressure check.** / Every 1 to 2 years.  Lipid and cholesterol check.** / Every 5 years beginning at age 20 years.  Lung cancer screening. / Every year if you are aged 55-80 years and have a 30-pack-year history of smoking and currently smoke or have quit within the past 15 years. Yearly screening is stopped once you have quit smoking for at least 15 years or develop a health problem that would prevent you from having lung cancer treatment.  Clinical breast exam.** / Every year after age 40 years.  BRCA-related cancer risk assessment.** / For women who have family members with a BRCA-related cancer (breast, ovarian, tubal, or peritoneal cancers).  Mammogram.** / Every year beginning at age 40 years and continuing for as long as you are in good health. Consult with your health care provider.  Pap test.** / Every 3 years starting at age 30 years through age 65 or 70 years with a history of 3 consecutive normal Pap tests.  HPV screening.** / Every 3 years from ages 30 years through ages 65 to 70 years with a history of 3 consecutive normal Pap tests.  Fecal occult blood test (FOBT) of stool. / Every year beginning at age 50 years and continuing until age 75 years. You may not need to do this test if you get a colonoscopy every 10 years.  Flexible sigmoidoscopy or colonoscopy.** / Every 5 years for a flexible sigmoidoscopy or every 10 years for a colonoscopy beginning at age 50 years and continuing until age 75 years.  Hepatitis C blood test.** / For all people born from 1945 through  1965 and any individual with known risks for hepatitis C.  Skin self-exam. / Monthly.  Influenza vaccine. / Every year.  Tetanus, diphtheria, and acellular pertussis (Tdap/Td) vaccine.** / Consult your health care provider. Pregnant women should receive 1 dose of Tdap vaccine during each pregnancy. 1 dose of Td every 10 years.  Varicella vaccine.** / Consult your health care provider. Pregnant females who do not have evidence of immunity should receive the first dose after pregnancy.  Zoster vaccine.** / 1 dose for adults aged 60 years or older.  Measles, mumps, rubella (MMR) vaccine.** / You need at least 1 dose of MMR if you were born in 1957 or later. You may also need a 2nd dose. For females of childbearing age, rubella immunity should be determined. If there is no evidence of immunity, females who are not pregnant should be vaccinated. If there is no evidence of immunity, females who are pregnant should delay immunization until after pregnancy.  Pneumococcal 13-valent conjugate (PCV13) vaccine.** / Consult your health care provider.  Pneumococcal polysaccharide (PPSV23) vaccine.** / 1 to 2 doses if you smoke cigarettes or if you have certain conditions.  Meningococcal vaccine.** / Consult your health care provider.  Hepatitis A vaccine.** / Consult your health care provider.  Hepatitis B vaccine.** / Consult your health care provider.  Haemophilus influenzae type b (Hib) vaccine.** / Consult your health care provider. Ages 65   years and over  Blood pressure check.** / Every 1 to 2 years.  Lipid and cholesterol check.** / Every 5 years beginning at age 22 years.  Lung cancer screening. / Every year if you are aged 73-80 years and have a 30-pack-year history of smoking and currently smoke or have quit within the past 15 years. Yearly screening is stopped once you have quit smoking for at least 15 years or develop a health problem that would prevent you from having lung cancer  treatment.  Clinical breast exam.** / Every year after age 4 years.  BRCA-related cancer risk assessment.** / For women who have family members with a BRCA-related cancer (breast, ovarian, tubal, or peritoneal cancers).  Mammogram.** / Every year beginning at age 40 years and continuing for as long as you are in good health. Consult with your health care provider.  Pap test.** / Every 3 years starting at age 9 years through age 34 or 91 years with 3 consecutive normal Pap tests. Testing can be stopped between 65 and 70 years with 3 consecutive normal Pap tests and no abnormal Pap or HPV tests in the past 10 years.  HPV screening.** / Every 3 years from ages 57 years through ages 64 or 45 years with a history of 3 consecutive normal Pap tests. Testing can be stopped between 65 and 70 years with 3 consecutive normal Pap tests and no abnormal Pap or HPV tests in the past 10 years.  Fecal occult blood test (FOBT) of stool. / Every year beginning at age 15 years and continuing until age 17 years. You may not need to do this test if you get a colonoscopy every 10 years.  Flexible sigmoidoscopy or colonoscopy.** / Every 5 years for a flexible sigmoidoscopy or every 10 years for a colonoscopy beginning at age 86 years and continuing until age 71 years.  Hepatitis C blood test.** / For all people born from [birth year] through 1964 and any individual with known risks for hepatitis C.  Osteoporosis screening.** / A one-time screening for women ages 83 years and over and women at risk for fractures or osteoporosis.  Skin self-exam. / Monthly.  Influenza vaccine. / Every year.  Tetanus, diphtheria, and acellular pertussis (Tdap/Td) vaccine.** / 1 dose of Td every 10 years.  Varicella vaccine.** / Consult your health care provider.  Zoster vaccine.** / 1 dose for adults aged 61 years or older.  Pneumococcal 13-valent conjugate (PCV13) vaccine.** / Consult your health care provider.  Pneumococcal  polysaccharide (PPSV23) vaccine.** / 1 dose for all adults aged 28 years and older.  Meningococcal vaccine.** / Consult your health care provider.  Hepatitis A vaccine.** / Consult your health care provider.  Hepatitis B vaccine.** / Consult your health care provider.  Haemophilus influenzae type b (Hib) vaccine.** / Consult your health care provider. ** Family history and personal history of risk and conditions may change your health care provider's recommendations. Document Released: 08/10/2001 Document Revised: 10/29/2013 Document Reviewed: 11/09/2010 [facility] Patient Information 2015 [place], [place]. This information is not intended to replace advice given to you by your health care provider. Make sure you discuss any questions you have with your health care provider.
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
## 2015-02-11 NOTE — Assessment & Plan Note (Signed)
Encouraged heart healthy diet, increase exercise, avoid trans fats, consider a krill oil cap daily 

## 2015-02-12 LAB — LIPID PANEL
Cholesterol: 177 mg/dL (ref 0–200)
HDL: 52.1 mg/dL (ref >39.00)
LDL Cholesterol: 110 mg/dL — ABNORMAL HIGH (ref 0–99)
NonHDL: 124.92
Total CHOL/HDL Ratio: 3
Triglycerides: 77 mg/dL (ref 0.0–149.0)
VLDL: 15.4 mg/dL (ref 0.0–40.0)

## 2015-02-12 LAB — HEPATITIS C ANTIBODY: HCV Ab: NEGATIVE

## 2015-02-17 ENCOUNTER — Encounter: Payer: Self-pay | Admitting: Gastroenterology

## 2015-02-20 ENCOUNTER — Other Ambulatory Visit: Payer: Self-pay | Admitting: Family Medicine

## 2015-02-21 DIAGNOSIS — K5900 Constipation, unspecified: Secondary | ICD-10-CM | POA: Diagnosis not present

## 2015-02-21 DIAGNOSIS — Z86010 Personal history of colonic polyps: Secondary | ICD-10-CM | POA: Diagnosis not present

## 2015-02-21 DIAGNOSIS — K920 Hematemesis: Secondary | ICD-10-CM | POA: Diagnosis not present

## 2015-02-21 DIAGNOSIS — R933 Abnormal findings on diagnostic imaging of other parts of digestive tract: Secondary | ICD-10-CM | POA: Diagnosis not present

## 2015-02-21 DIAGNOSIS — R109 Unspecified abdominal pain: Secondary | ICD-10-CM | POA: Diagnosis not present

## 2015-02-23 ENCOUNTER — Encounter: Payer: Self-pay | Admitting: Family Medicine

## 2015-02-23 NOTE — Assessment & Plan Note (Signed)
On Levothyroxine, continue to monitor 

## 2015-02-23 NOTE — Assessment & Plan Note (Addendum)
Patient encouraged to maintain heart healthy diet, regular exercise, adequate sleep. Consider daily probiotics. Take medications as prescribed. Labs reviewed. Given and reviewed copy of ACP documents from [place] Secretary of State and encouraged to complete and return 

## 2015-02-23 NOTE — Assessment & Plan Note (Signed)
Encouraged good sleep hygiene such as dark, quiet room. No blue/green glowing lights such as computer screens in bedroom. No alcohol or stimulants in evening. Cut down on caffeine as able. Regular exercise is helpful but not just prior to bed time.  

## 2015-02-25 ENCOUNTER — Telehealth: Payer: Self-pay | Admitting: *Deleted

## 2015-02-25 ENCOUNTER — Telehealth: Payer: Self-pay | Admitting: Emergency Medicine

## 2015-02-25 ENCOUNTER — Telehealth: Payer: Self-pay | Admitting: Cardiology

## 2015-02-25 NOTE — Telephone Encounter (Signed)
She can have this procedure without further cardiovascular studies.  Call Ms. Heffler with the results and send results to [name], MD

## 2015-02-25 NOTE — Telephone Encounter (Signed)
Requesting surgical clearance:   1. Type of surgery: Colonoscopy/Endoscopy   2. Surgeon: Dr Watt Climes  3. Surgical date: Wednesday, 31st August  4. Medications that need to be help: NONE  Pt saw you last 04/30/14 and she had a stress test on 05/02/14, Is patient cleared for surgery or do she need to be seen, no medication to be held?

## 2015-02-25 NOTE — Telephone Encounter (Signed)
Pt is having this tomorrow needs to know if it's ok to have the procedure [phone] Melanee Spry

## 2015-02-25 NOTE — Telephone Encounter (Signed)
Patient calling to get update on Surgical Clearance.   Patient concerned about the damage that was done to lung and heart during the research that was done at [facility] for breast cancer Patient wants to know if Dr. Lamonte Sakai thinks she will be okay to have the procedures done.  I advised her that we received surgery clearance forms from Endo office and that they were in Dr. Agustina Caroli box to look at when he gets here this afternoon. Patient requesting call back once Dr. Lamonte Sakai has signed the forms.

## 2015-02-25 NOTE — Telephone Encounter (Signed)
She is OK for procedure from my perspective UNLESS she is currently having any problems. We will call to ask her

## 2015-02-25 NOTE — Telephone Encounter (Signed)
Spoke with pt. States she is doing well. Fax has been sent to [facility]. Nothing further was needed.

## 2015-02-25 NOTE — Telephone Encounter (Signed)
Received call from Gastroenterologist office confirming receipt of Surgical Clearance form.  Patient is having Endoscopy and Colonoscopy done tomorrow morning and needs the clearance form faxed back today.  Form is in Dr. Agustina Caroli box awaiting signature.

## 2015-02-25 NOTE — Telephone Encounter (Signed)
Did we receive the clearance request from Dr. Watt Climes for this patient to have a colonoscopy and endoscopy tomorrow 02/26/15?   Patient does not want to do prep if clearance not given.  Please call ASAP.

## 2015-02-25 NOTE — Telephone Encounter (Signed)
Patient had talked to [facility] and she said that this was taken care of with a letter sent

## 2015-02-25 NOTE — Telephone Encounter (Signed)
Clearance was send via epic to [facility] and Dr Watt Climes

## 2015-02-26 DIAGNOSIS — K635 Polyp of colon: Secondary | ICD-10-CM | POA: Diagnosis not present

## 2015-02-26 DIAGNOSIS — D126 Benign neoplasm of colon, unspecified: Secondary | ICD-10-CM | POA: Diagnosis not present

## 2015-02-26 DIAGNOSIS — K449 Diaphragmatic hernia without obstruction or gangrene: Secondary | ICD-10-CM | POA: Diagnosis not present

## 2015-02-26 DIAGNOSIS — Z86010 Personal history of colonic polyps: Secondary | ICD-10-CM | POA: Diagnosis not present

## 2015-02-26 DIAGNOSIS — D120 Benign neoplasm of cecum: Secondary | ICD-10-CM | POA: Diagnosis not present

## 2015-02-26 DIAGNOSIS — K920 Hematemesis: Secondary | ICD-10-CM | POA: Diagnosis not present

## 2015-02-26 DIAGNOSIS — Z09 Encounter for follow-up examination after completed treatment for conditions other than malignant neoplasm: Secondary | ICD-10-CM | POA: Diagnosis not present

## 2015-02-26 DIAGNOSIS — D123 Benign neoplasm of transverse colon: Secondary | ICD-10-CM | POA: Diagnosis not present

## 2015-02-26 LAB — HM COLONOSCOPY

## 2015-03-10 ENCOUNTER — Ambulatory Visit
Admission: RE | Admit: 2015-03-10 | Discharge: 2015-03-10 | Disposition: A | Discharge status: Home / Self Care | Payer: Medicare Other | Source: Ambulatory Visit

## 2015-03-10 DIAGNOSIS — Z1231 Encounter for screening mammogram for malignant neoplasm of breast: Secondary | ICD-10-CM | POA: Diagnosis not present

## 2015-03-12 ENCOUNTER — Other Ambulatory Visit: Payer: Self-pay | Admitting: Obstetrics & Gynecology

## 2015-03-12 DIAGNOSIS — R928 Other abnormal and inconclusive findings on diagnostic imaging of breast: Secondary | ICD-10-CM

## 2015-03-14 ENCOUNTER — Ambulatory Visit
Admission: RE | Admit: 2015-03-14 | Discharge: 2015-03-14 | Disposition: A | Discharge status: Home / Self Care | Payer: Medicare Other | Source: Ambulatory Visit | Attending: Obstetrics & Gynecology | Admitting: Obstetrics & Gynecology

## 2015-03-14 DIAGNOSIS — N63 Unspecified lump in breast: Secondary | ICD-10-CM | POA: Diagnosis not present

## 2015-03-14 DIAGNOSIS — R928 Other abnormal and inconclusive findings on diagnostic imaging of breast: Secondary | ICD-10-CM

## 2015-03-14 DIAGNOSIS — N6012 Diffuse cystic mastopathy of left breast: Secondary | ICD-10-CM | POA: Diagnosis not present

## 2015-03-19 ENCOUNTER — Other Ambulatory Visit: Payer: Medicare Other

## 2015-03-24 ENCOUNTER — Ambulatory Visit (HOSPITAL_BASED_OUTPATIENT_CLINIC_OR_DEPARTMENT_OTHER)
Admission: RE | Admit: 2015-03-24 | Discharge: 2015-03-24 | Disposition: A | Discharge status: Home / Self Care | Payer: Medicare Other | Source: Ambulatory Visit | Attending: Family | Admitting: Family

## 2015-03-24 ENCOUNTER — Other Ambulatory Visit: Payer: Self-pay | Admitting: Oncology

## 2015-03-24 ENCOUNTER — Ambulatory Visit (INDEPENDENT_AMBULATORY_CARE_PROVIDER_SITE_OTHER): Payer: Medicare Other | Admitting: Family

## 2015-03-24 ENCOUNTER — Encounter: Payer: Self-pay | Admitting: Family

## 2015-03-24 ENCOUNTER — Telehealth: Payer: Self-pay | Admitting: Family

## 2015-03-24 ENCOUNTER — Encounter: Payer: Self-pay | Admitting: Family Medicine

## 2015-03-24 VITALS — BP 120/74 | HR 107 | Temp 98.2°F | Resp 16 | Ht 65.5 in | Wt 161.2 lb

## 2015-03-24 DIAGNOSIS — R0989 Other specified symptoms and signs involving the circulatory and respiratory systems: Secondary | ICD-10-CM | POA: Insufficient documentation

## 2015-03-24 DIAGNOSIS — J189 Pneumonia, unspecified organism: Secondary | ICD-10-CM

## 2015-03-24 DIAGNOSIS — R05 Cough: Secondary | ICD-10-CM | POA: Diagnosis not present

## 2015-03-24 DIAGNOSIS — J029 Acute pharyngitis, unspecified: Secondary | ICD-10-CM

## 2015-03-24 DIAGNOSIS — R918 Other nonspecific abnormal finding of lung field: Secondary | ICD-10-CM | POA: Diagnosis not present

## 2015-03-24 DIAGNOSIS — Z9481 Bone marrow transplant status: Secondary | ICD-10-CM | POA: Insufficient documentation

## 2015-03-24 DIAGNOSIS — J069 Acute upper respiratory infection, unspecified: Secondary | ICD-10-CM

## 2015-03-24 DIAGNOSIS — Z853 Personal history of malignant neoplasm of breast: Secondary | ICD-10-CM | POA: Diagnosis not present

## 2015-03-24 DIAGNOSIS — R509 Fever, unspecified: Secondary | ICD-10-CM | POA: Diagnosis not present

## 2015-03-24 LAB — URINALYSIS, ROUTINE W REFLEX MICROSCOPIC
Hgb urine dipstick: NEGATIVE
Ketones, ur: 15 — AB
Leukocytes, UA: NEGATIVE
Nitrite: NEGATIVE
RBC / HPF: NONE SEEN (ref 0–?)
Specific Gravity, Urine: >=1.03 — AB (ref 1.000–1.030)
Urine Glucose: NEGATIVE
Urobilinogen, UA: 0.2 (ref 0.0–1.0)
pH: 5.5 (ref 5.0–8.0)

## 2015-03-24 LAB — COMPREHENSIVE METABOLIC PANEL
ALT: 18 U/L (ref 0–35)
AST: 23 U/L (ref 0–37)
Albumin: 4.2 g/dL (ref 3.5–5.2)
Alkaline Phosphatase: 111 U/L (ref 39–117)
BUN: 26 mg/dL — ABNORMAL HIGH (ref 6–23)
CO2: 27 mEq/L (ref 19–32)
Calcium: 9.4 mg/dL (ref 8.4–10.5)
Chloride: 103 mEq/L (ref 96–112)
Creatinine, Ser: 0.86 mg/dL (ref 0.40–1.20)
GFR: 72.03 mL/min (ref >60.00)
Glucose, Bld: 101 mg/dL — ABNORMAL HIGH (ref 70–99)
Potassium: 4.4 mEq/L (ref 3.5–5.1)
Sodium: 139 mEq/L (ref 135–145)
Total Bilirubin: 0.6 mg/dL (ref 0.2–1.2)
Total Protein: 7.1 g/dL (ref 6.0–8.3)

## 2015-03-24 LAB — CBC WITH DIFFERENTIAL/PLATELET
Basophils Absolute: 0 10*3/uL (ref 0.0–0.1)
Basophils Relative: 0.5 % (ref 0.0–3.0)
Eosinophils Absolute: 0.1 10*3/uL (ref 0.0–0.7)
Eosinophils Relative: 1.1 % (ref 0.0–5.0)
HCT: 44.6 % (ref 36.0–46.0)
Hemoglobin: 14.8 g/dL (ref 12.0–15.0)
Lymphocytes Relative: 18.2 % (ref 12.0–46.0)
Lymphs Abs: 1.5 10*3/uL (ref 0.7–4.0)
MCHC: 33.1 g/dL (ref 30.0–36.0)
MCV: 95.9 fl (ref 78.0–100.0)
Monocytes Absolute: 0.7 10*3/uL (ref 0.1–1.0)
Monocytes Relative: 8.6 % (ref 3.0–12.0)
Neutro Abs: 6 10*3/uL (ref 1.4–7.7)
Neutrophils Relative %: 71.6 % (ref 43.0–77.0)
Platelets: 273 10*3/uL (ref 150.0–400.0)
RBC: 4.65 Mil/uL (ref 3.87–5.11)
RDW: 13.9 % (ref 11.5–15.5)
WBC: 8.3 10*3/uL (ref 4.0–10.5)

## 2015-03-24 LAB — POCT RAPID STREP A (OFFICE): Rapid Strep A Screen: NEGATIVE

## 2015-03-24 MED ORDER — MOXIFLOXACIN HCL 400 MG PO TABS: 400.0000 mg | ORAL_TABLET | Freq: Every day | ORAL | Status: DC

## 2015-03-24 NOTE — Progress Notes (Signed)
Pre visit review using our clinic review tool, if applicable. No additional management support is needed unless otherwise documented below in the visit note. 
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
## 2015-03-24 NOTE — Telephone Encounter (Signed)
Notified pt and she voices understanding.  F/u scheduled for 04/03/15 at 4:15pm.  Ok per Dr Charlett Blake.

## 2015-03-24 NOTE — Telephone Encounter (Signed)
Lab work is still pending, however, CXR shows possible right sided pneumonia.  I would recommend that she start avelox once daily.  Follow up in 1 week with Dr. Charlett Blake. Call sooner if persistent fever, symptoms worsen or if symptoms do not improve.

## 2015-03-24 NOTE — Patient Instructions (Addendum)
Please complete lab work prior to leaving. Complete chest x ray on the first floor. Continue tylenol as needed for fever add an antihistamine such as allegra as needed for ear pressure/nasla drainage. You may use delsym as needed for cough.  Push fluids today- you need to drink at least 64 oz of water today. I think you are dehydrated.  Call if fever >101, if symptoms worsen or if not improved in 2-3 days.

## 2015-03-25 ENCOUNTER — Encounter (HOSPITAL_COMMUNITY): Payer: Self-pay

## 2015-03-25 ENCOUNTER — Emergency Department (HOSPITAL_COMMUNITY)
Admission: EM | Admit: 2015-03-25 | Discharge: 2015-03-25 | Disposition: A | Discharge status: Home / Self Care | Payer: Medicare Other | Attending: Emergency Medicine | Admitting: Emergency Medicine

## 2015-03-25 ENCOUNTER — Encounter: Payer: Self-pay | Admitting: Family

## 2015-03-25 DIAGNOSIS — Z8719 Personal history of other diseases of the digestive system: Secondary | ICD-10-CM | POA: Diagnosis not present

## 2015-03-25 DIAGNOSIS — I2510 Atherosclerotic heart disease of native coronary artery without angina pectoris: Secondary | ICD-10-CM | POA: Insufficient documentation

## 2015-03-25 DIAGNOSIS — T3695XA Adverse effect of unspecified systemic antibiotic, initial encounter: Secondary | ICD-10-CM | POA: Diagnosis not present

## 2015-03-25 DIAGNOSIS — E039 Hypothyroidism, unspecified: Secondary | ICD-10-CM | POA: Insufficient documentation

## 2015-03-25 DIAGNOSIS — Z79899 Other long term (current) drug therapy: Secondary | ICD-10-CM | POA: Diagnosis not present

## 2015-03-25 DIAGNOSIS — F418 Other specified anxiety disorders: Secondary | ICD-10-CM | POA: Diagnosis not present

## 2015-03-25 DIAGNOSIS — Z853 Personal history of malignant neoplasm of breast: Secondary | ICD-10-CM | POA: Insufficient documentation

## 2015-03-25 DIAGNOSIS — T368X5A Adverse effect of other systemic antibiotics, initial encounter: Secondary | ICD-10-CM | POA: Diagnosis present

## 2015-03-25 DIAGNOSIS — Z8739 Personal history of other diseases of the musculoskeletal system and connective tissue: Secondary | ICD-10-CM | POA: Diagnosis not present

## 2015-03-25 DIAGNOSIS — T50905A Adverse effect of unspecified drugs, medicaments and biological substances, initial encounter: Secondary | ICD-10-CM

## 2015-03-25 DIAGNOSIS — Z87820 Personal history of traumatic brain injury: Secondary | ICD-10-CM | POA: Diagnosis not present

## 2015-03-25 DIAGNOSIS — Z86010 Personal history of colonic polyps: Secondary | ICD-10-CM | POA: Insufficient documentation

## 2015-03-25 DIAGNOSIS — R1310 Dysphagia, unspecified: Secondary | ICD-10-CM | POA: Insufficient documentation

## 2015-03-25 DIAGNOSIS — Q269 Congenital malformation of great vein, unspecified: Secondary | ICD-10-CM | POA: Diagnosis not present

## 2015-03-25 DIAGNOSIS — R42 Dizziness and giddiness: Secondary | ICD-10-CM | POA: Diagnosis not present

## 2015-03-25 LAB — URINE CULTURE
Colony Count: NO GROWTH
Organism ID, Bacteria: NO GROWTH

## 2015-03-25 MED ORDER — AZITHROMYCIN 250 MG PO TABS: 250.0000 mg | ORAL_TABLET | Freq: Every day | ORAL | Status: AC

## 2015-03-25 MED ORDER — SODIUM CHLORIDE 0.9 % IV SOLN
Freq: Once | INTRAVENOUS | Status: AC
  Administered 2015-03-25: 02:00:00 via INTRAVENOUS

## 2015-03-25 MED ORDER — DEXTROSE 5 % IV SOLN
1.0000 g | Freq: Once | INTRAVENOUS | Status: AC
  Administered 2015-03-25: 1 g via INTRAVENOUS
  Filled 2015-03-25: qty 10

## 2015-03-25 MED ORDER — FAMOTIDINE IN NACL 20-0.9 MG/50ML-% IV SOLN
20.0000 mg | Freq: Once | INTRAVENOUS | Status: AC
  Administered 2015-03-25: 20 mg via INTRAVENOUS
  Filled 2015-03-25: qty 50

## 2015-03-25 MED ORDER — METHYLPREDNISOLONE SODIUM SUCC 125 MG IJ SOLR
125.0000 mg | Freq: Once | INTRAMUSCULAR | Status: AC
  Administered 2015-03-25: 125 mg via INTRAVENOUS
  Filled 2015-03-25: qty 2
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
## 2015-03-25 NOTE — ED Notes (Signed)
Pt took one dose of Avelox tonight at [place], about 30 minutes after that she felt like her heart started to race and she feels like her tongue is swelling and she 's complains of random pains that are in different places on her body

## 2015-03-25 NOTE — ED Notes (Signed)
Pt stated she felt itchy after Rocephin was given. Provider aware.

## 2015-03-25 NOTE — Discharge Instructions (Signed)
Drug Allergy °Allergic reactions to medicines are common. Some allergic reactions are mild. A delayed type of drug allergy that occurs 1 week or more after exposure to a medicine or vaccine is called serum sickness. A life-threatening, sudden (acute) allergic reaction that involves the whole body is called anaphylaxis. °CAUSES  °"True" drug allergies occur when there is an allergic reaction to a medicine. This is caused by overactivity of the immune system. First, the body becomes sensitized. The immune system is triggered by your first exposure to the medicine. Following this first exposure, future exposure to the same medicine may be life-threatening. °Almost any medicine can cause an allergic reaction. Common ones are: °· Penicillin. °· Sulfonamides (sulfa drugs). °· Local anesthetics. °· X-ray dyes that contain iodine. °SYMPTOMS  °Common symptoms of a minor allergic reaction are: °· Swelling around the mouth. °· An itchy red rash or hives. °· Vomiting or diarrhea. °Anaphylaxis can cause swelling of the mouth and throat. This makes it difficult to breathe and swallow. Severe reactions can be fatal within seconds, even after exposure to only a trace amount of the drug that causes the reaction. °HOME CARE INSTRUCTIONS  °· If you are unsure of what caused your reaction, keep a diary of foods and medicines used. Include the symptoms that followed. Avoid anything that causes reactions. °· You may want to follow up with an allergy specialist after the reaction has cleared in order to be tested to confirm the allergy. It is important to confirm that your reaction is an allergy, not just a side effect to the medicine. If you have a true allergy to a medicine, this may prevent that medicine and related medicines from being given to you when you are very ill. °· If you have hives or a rash: °¨ Take medicines as directed by your caregiver. °¨ You may use an over-the-counter antihistamine (diphenhydramine) as  needed. °¨ Apply cold compresses to the skin or take baths in cool water. Avoid hot baths or showers. °· If you are severely allergic: °¨ Continuous observation after a severe reaction may be needed. Hospitalization is often required. °¨ Wear a medical alert bracelet or necklace stating your allergy. °¨ You and your family must learn how to use an anaphylaxis kit or give an epinephrine injection to temporarily treat an emergency allergic reaction. If you have had a severe reaction, always carry your epinephrine injection or anaphylaxis kit with you. This can be lifesaving if you have a severe reaction. °· Do not drive or perform tasks after treatment until the medicines used to treat your reaction have worn off, or until your caregiver says it is okay. °SEEK MEDICAL CARE IF:  °· You think you had an allergic reaction. Symptoms usually start within 30 minutes after exposure. °· Symptoms are getting worse rather than better. °· You develop new symptoms. °· The symptoms that brought you to your caregiver return. °SEEK IMMEDIATE MEDICAL CARE IF:  °· You have swelling of the mouth, difficulty breathing, or wheezing. °· You have a tight feeling in your chest or throat. °· You develop hives, swelling, or itching all over your body. °· You develop severe vomiting or diarrhea. °· You feel faint or pass out. °This is an emergency. Use your epinephrine injection or anaphylaxis kit as you have been instructed. Call for emergency medical help. Even if you improve after the injection, you need to be examined at a hospital emergency department. °MAKE SURE YOU:  °· Understand these instructions. °· Will watch   your condition.  Will get help right away if you are not doing well or get worse. Document Released: 06/14/2005 Document Revised: 09/06/2011 Document Reviewed: 11/18/2010 [facility] Patient Information 2015 [place], [place]. This information is not intended to replace advice given to you by your health care provider. Make  sure you discuss any questions you have with your health care provider. I have listed Avelox among you allergies

## 2015-03-27 ENCOUNTER — Telehealth: Payer: Self-pay

## 2015-03-27 MED ORDER — CEFDINIR 300 MG PO CAPS: 300.0000 mg | ORAL_CAPSULE | Freq: Two times a day (BID) | ORAL | Status: DC

## 2015-03-27 MED ORDER — ALBUTEROL SULFATE HFA 108 (90 BASE) MCG/ACT IN AERS: 2.0000 | INHALATION_SPRAY | RESPIRATORY_TRACT | Status: DC | PRN

## 2015-03-27 MED ORDER — PREDNISONE 20 MG PO TABS: 20.0000 mg | ORAL_TABLET | Freq: Two times a day (BID) | ORAL | Status: DC

## 2015-03-27 NOTE — Telephone Encounter (Signed)
Spoke with patient who states that she is feeling very weak and is having chest tightness. States that she does not feel as if she is getting better. Recommended to go to ED. By listening to her talk she is not wheezing, gasping, or out of breath. She states that the tightness started this morning. Inquired if patient had an inhaler on hand. She had a ProAir. Advised to sit and rest. Take 1 puff of inhaler, wait one minute and take an additional. Gave patient my direct phone number to call me if any changes. Will discuss with Dr. Charlett Blake, patient's PCP.

## 2015-03-27 NOTE — Telephone Encounter (Signed)
Consulted with Dr Charlett Blake concerning patients condition.  Per Dr Charlett Blake relayed the following: Take Cefdinir 300mg  BID x 10 days Prednisone 20mg  BID x 5 days Albuterol(ProAir) inhaler PRN q 4 hours Zyrtec or Claritin 2 x daily x 7 days Zantac or Pepcid 2 x daily x 7 days Probiotics daily to protect GI system  Relayed the above information to the patient. Very concerned about her condition. Advised of urgent symptoms to go to ED or call 911. Patient very appreciative of care.

## 2015-03-28 NOTE — Telephone Encounter (Signed)
Spoke with patient who is feeling some better. Is resting as instructed. Speaks in normal tones with no wheezing evident, SOB, or gasp. Patient has a follow up appt next Thurs 04/03/2015

## 2015-03-31 ENCOUNTER — Telehealth: Payer: Self-pay | Admitting: Family Medicine

## 2015-03-31 ENCOUNTER — Encounter: Payer: Self-pay | Admitting: Family Medicine

## 2015-03-31 NOTE — Telephone Encounter (Signed)
gaye called the patient on Friday to see how she was feeling  She is not feeling any better and now her husband has pneumonia and shingles  Please call her on her cell

## 2015-04-01 NOTE — Telephone Encounter (Signed)
Called the patient and she had already contacted Dr. Charlett Blake on [facility].  She is starting to feel a little better and has an appointment with Dr. Charlett Blake this Thursday.

## 2015-04-03 ENCOUNTER — Encounter: Payer: Self-pay | Admitting: Family Medicine

## 2015-04-03 ENCOUNTER — Ambulatory Visit (INDEPENDENT_AMBULATORY_CARE_PROVIDER_SITE_OTHER): Payer: Medicare Other | Admitting: Family Medicine

## 2015-04-03 VITALS — BP 140/72 | HR 99 | Temp 98.6°F | Ht 65.5 in | Wt 162.2 lb

## 2015-04-03 DIAGNOSIS — E039 Hypothyroidism, unspecified: Secondary | ICD-10-CM

## 2015-04-03 DIAGNOSIS — J189 Pneumonia, unspecified organism: Secondary | ICD-10-CM

## 2015-04-03 DIAGNOSIS — K219 Gastro-esophageal reflux disease without esophagitis: Secondary | ICD-10-CM

## 2015-04-03 MED ORDER — ALPRAZOLAM 0.5 MG PO TABS: ORAL_TABLET | ORAL | Status: DC

## 2015-04-03 MED ORDER — ONDANSETRON 8 MG PO TBDP: 8.0000 mg | ORAL_TABLET | Freq: Three times a day (TID) | ORAL | Status: DC | PRN

## 2015-04-03 MED ORDER — PREDNISONE 20 MG PO TABS: 20.0000 mg | ORAL_TABLET | Freq: Two times a day (BID) | ORAL | Status: DC

## 2015-04-03 NOTE — Progress Notes (Signed)
Pre visit review using our clinic review tool, if applicable. No additional management support is needed unless otherwise documented below in the visit note. 

## 2015-04-03 NOTE — Patient Instructions (Signed)
Prevnar (PCV 13) is recommended  Call insurance to see if they will pay for Zostavax/Shingles  Community-Acquired Pneumonia, Adult Pneumonia is an infection of the lungs. There are different types of pneumonia. One type can develop while a person is in a hospital. A different type, called community-acquired pneumonia, develops in people who are not, or have not recently been, in the hospital or other health care facility.  CAUSES Pneumonia may be caused by bacteria, viruses, or funguses. Community-acquired pneumonia is often caused by Streptococcus pneumonia bacteria. These bacteria are often passed from one person to another by breathing in droplets from the cough or sneeze of an infected person. RISK FACTORS The condition is more likely to develop in:  People who havechronic diseases, such as chronic obstructive pulmonary disease (COPD), asthma, congestive heart failure, cystic fibrosis, diabetes, or kidney disease.  People who haveearly-stage or late-stage HIV.  People who havesickle cell disease.  People who havehad their spleen removed (splenectomy).  People who havepoor Human resources officer.  People who havemedical conditions that increase the risk of breathing in (aspirating) secretions their own mouth and nose.   People who havea weakened immune system (immunocompromised).  People who smoke.  People whotravel to areas where pneumonia-causing germs commonly exist.  People whoare around animal habitats or animals that have pneumonia-causing germs, including birds, bats, rabbits, cats, and farm animals. SYMPTOMS Symptoms of this condition include:  Adry cough.  A wet (productive) cough.  Fever.  Sweating.  Chest pain, especially when breathing deeply or coughing.  Rapid breathing or difficulty breathing.  Shortness of breath.  Shaking chills.  Fatigue.  Muscle aches. DIAGNOSIS Your health care provider will take a medical history and perform a physical  exam. You may also have other tests, including:  Imaging studies of your chest, including X-rays.  Tests to check your blood oxygen level and other blood gases.  Other tests on blood, mucus (sputum), fluid around your lungs (pleural fluid), and urine. If your pneumonia is severe, other tests may be done to identify the specific cause of your illness. TREATMENT The type of treatment that you receive depends on many factors, such as the cause of your pneumonia, the medicines you take, and other medical conditions that you have. For most adults, treatment and recovery from pneumonia may occur at home. In some cases, treatment must happen in a hospital. Treatment may include:  Antibiotic medicines, if the pneumonia was caused by bacteria.  Antiviral medicines, if the pneumonia was caused by a virus.  Medicines that are given by mouth or through an IV tube.  Oxygen.  Respiratory therapy. Although rare, treating severe pneumonia may include:  Mechanical ventilation. This is done if you are not breathing well on your own and you cannot maintain a safe blood oxygen level.  Thoracentesis. This procedureremoves fluid around one lung or both lungs to help you breathe better. HOME CARE INSTRUCTIONS  Take over-the-counter and prescription medicines only as told by your health care provider.  Only takecough medicine if you are losing sleep. Understand that cough medicine can prevent your body's natural ability to remove mucus from your lungs.  If you were prescribed an antibiotic medicine, take it as told by your health care provider. Do not stop taking the antibiotic even if you start to feel better.  Sleep in a semi-upright position at night. Try sleeping in a reclining chair, or place a few pillows under your head.  Do not use tobacco products, including cigarettes, chewing tobacco, and e-cigarettes. If  you need help quitting, ask your health care provider.  Drink enough water to keep  your urine clear or pale yellow. This will help to thin out mucus secretions in your lungs. PREVENTION There are ways that you can decrease your risk of developing community-acquired pneumonia. Consider getting a pneumococcal vaccine if:  You are older than 58 years of age.  You are older than 58 years of age and are undergoing cancer treatment, have chronic lung disease, or have other medical conditions that affect your immune system. Ask your health care provider if this applies to you. There are different types and schedules of pneumococcal vaccines. Ask your health care provider which vaccination option is best for you. You may also prevent community-acquired pneumonia if you take these actions:  Get an influenza vaccine every year. Ask your health care provider which type of influenza vaccine is best for you.  Go to the dentist on a regular basis.  Wash your hands often. Use hand sanitizer if soap and water are not available. SEEK MEDICAL CARE IF:  You have a fever.  You are losing sleep because you cannot control your cough with cough medicine. SEEK IMMEDIATE MEDICAL CARE IF:  You have worsening shortness of breath.  You have increased chest pain.  Your sickness becomes worse, especially if you are an older adult or have a weakened immune system.  You cough up blood.   This information is not intended to replace advice given to you by your health care provider. Make sure you discuss any questions you have with your health care provider.   Document Released: 06/14/2005 Document Revised: 03/05/2015 Document Reviewed: 10/09/2014 Elsevier Interactive Patient Education [employer].

## 2015-04-07 ENCOUNTER — Encounter: Payer: Self-pay | Admitting: Family Medicine

## 2015-04-07 DIAGNOSIS — J189 Pneumonia, unspecified organism: Secondary | ICD-10-CM | POA: Insufficient documentation

## 2015-04-07 HISTORY — DX: Pneumonia, unspecified organism: J18.9

## 2015-04-07 NOTE — Assessment & Plan Note (Signed)
Avoid offending foods, start probiotics. Do not eat large meals in late evening and consider raising head of bed.  

## 2015-04-07 NOTE — Assessment & Plan Note (Deleted)
Avoid offending foods, start probiotics. Do not eat large meals in late evening and consider raising head of bed.  

## 2015-04-07 NOTE — Assessment & Plan Note (Signed)
Encouraged increased rest and hydration, add probiotics, zinc such as Coldeze or Xicam. Treat fevers as needed. May need retreatment. Will need repeat CXR in next 4-8 weeks
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
## 2015-04-07 NOTE — Assessment & Plan Note (Signed)
On Levothyroxine, continue to monitor 

## 2015-04-11 ENCOUNTER — Encounter: Payer: Self-pay | Admitting: Physician Assistant

## 2015-04-11 ENCOUNTER — Ambulatory Visit (INDEPENDENT_AMBULATORY_CARE_PROVIDER_SITE_OTHER): Payer: Medicare Other | Admitting: Physician Assistant

## 2015-04-11 ENCOUNTER — Telehealth: Payer: Self-pay | Admitting: Family Medicine

## 2015-04-11 ENCOUNTER — Ambulatory Visit (HOSPITAL_BASED_OUTPATIENT_CLINIC_OR_DEPARTMENT_OTHER)
Admission: RE | Admit: 2015-04-11 | Discharge: 2015-04-11 | Disposition: A | Discharge status: Home / Self Care | Payer: Medicare Other | Source: Ambulatory Visit | Attending: Physician Assistant | Admitting: Physician Assistant

## 2015-04-11 VITALS — BP 139/82 | HR 95 | Temp 97.9°F | Resp 16 | Ht 65.5 in | Wt 166.1 lb

## 2015-04-11 DIAGNOSIS — R0602 Shortness of breath: Secondary | ICD-10-CM | POA: Diagnosis not present

## 2015-04-11 DIAGNOSIS — R42 Dizziness and giddiness: Secondary | ICD-10-CM | POA: Diagnosis not present

## 2015-04-11 DIAGNOSIS — R0609 Other forms of dyspnea: Secondary | ICD-10-CM | POA: Insufficient documentation

## 2015-04-11 DIAGNOSIS — J189 Pneumonia, unspecified organism: Secondary | ICD-10-CM | POA: Insufficient documentation

## 2015-04-11 LAB — CBC WITH DIFFERENTIAL/PLATELET
Basophils Absolute: 0.1 10*3/uL (ref 0.0–0.1)
Basophils Relative: 0.7 % (ref 0.0–3.0)
Eosinophils Absolute: 0.1 10*3/uL (ref 0.0–0.7)
Eosinophils Relative: 1 % (ref 0.0–5.0)
HCT: 43.5 % (ref 36.0–46.0)
Hemoglobin: 14.4 g/dL (ref 12.0–15.0)
Lymphocytes Relative: 21.2 % (ref 12.0–46.0)
Lymphs Abs: 1.6 10*3/uL (ref 0.7–4.0)
MCHC: 33.2 g/dL (ref 30.0–36.0)
MCV: 95.8 fl (ref 78.0–100.0)
Monocytes Absolute: 0.6 10*3/uL (ref 0.1–1.0)
Monocytes Relative: 7.4 % (ref 3.0–12.0)
Neutro Abs: 5.3 10*3/uL (ref 1.4–7.7)
Neutrophils Relative %: 69.7 % (ref 43.0–77.0)
Platelets: 266 10*3/uL (ref 150.0–400.0)
RBC: 4.53 Mil/uL (ref 3.87–5.11)
RDW: 13.8 % (ref 11.5–15.5)
WBC: 7.6 10*3/uL (ref 4.0–10.5)

## 2015-04-11 LAB — BRAIN NATRIURETIC PEPTIDE: Pro B Natriuretic peptide (BNP): 17 pg/mL (ref 0.0–100.0)

## 2015-04-11 NOTE — Patient Instructions (Signed)
Your examination is completely normal. I do not hear any worrisome sounds in your lung nor is there any leg swelling present.  Please stay hydrated and continue your breathing medications.   Please go to the lab for STAT blood work. Then go downstairs for STAT chest x-ray. I will call with all results. Again continue chronic medications. We will alter regimen based on results.   Please call to set up follow-up with your Cardiologist for ongoing management of cardiac issues.   If symptoms acutely worsen, please call 911 or go to the ER.

## 2015-04-11 NOTE — Telephone Encounter (Signed)
Caller name: Noemy Hallmon   Relationship to patient: Self   Can be reached: [phone]   Reason for call: Pt called in requesting a call back from provider or CMA because she says that she was Dx with pneumonia and she is still experiencing trouble beathing, and now having fluid around on her ankles. Pt want to know what she should do. Pt want to hold off on making an appt until she speaks with someone. I suggested [facility] nurse. Pt accepted. Transferred call to team health for triaging.    Please call back to discuss with pt as she wish.    Thanks.

## 2015-04-11 NOTE — Telephone Encounter (Signed)
Pt seen by Einar Pheasant earlier today.

## 2015-04-11 NOTE — Assessment & Plan Note (Signed)
EKG without acute dining. Does not short PR interval. Patient already with cardiology follow-up. Encouraged to schedule follow-up appointment. Vitals stable with normal respirations and 02 at 100% after ambulating greater than 300 ft. Lung/Cardiac exam without murmur, wheezes, rales or rhonchi. Examination of peripheral pulses normal. No peripheral edema noted.STAT CXR ordered. STAT CBC, BNP in process. Wells Cirteria Score for PE at 0 and Wells Score for DVT at -2, so do not feel CTA or D-dimer indicated presently. Discussed with supervising MD Charlett Blake who agrees. Patient's endorses symptoms are highly inconsistent with examination. Patient not struggling for air when moving or when answering questions. She does seem to have increasing work of breathing while I am questioning her but when she is distracted,using her cell phone or when she is observed during exam this disappears. I feel that anxiety is significant component of her presentation but she denies. Encouraged rest, increased fluids. Xanax as directed. SABA as directed. Will await STAT labs and CXR before making further decisions. ER if anything worsens.  Addendum -- STAT CXR negative for acute abnormality. Labs pending.
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
## 2015-04-11 NOTE — Telephone Encounter (Signed)
Name: [name]  DOB: July 03, 1955    Initial Comment Caller states been diagnosed with pna, hard time breathing, ankles very swollen   Nurse Assessment  Nurse: Mallie Mussel, RN, Alveta Heimlich Date/Time (Eastern Time): 04/11/2015 8:49:10 AM  Confirm and document reason for call. If symptomatic, describe symptoms. ---Caller states that she was diagnosed with pneumonia a week ago. She had a follow up with her doctor. She is still having difficulty breathing. She is speaking in complete sentences. Her ankles are swelling even with taking her Lasix. The swelling goes into the calf of both legs. She has CHF. She has SOB even at rest. Sometimes her heart beats rapidly.  Has the patient traveled out of the country within the last 30 days? ---No  Does the patient have any new or worsening symptoms? ---Yes  Will a triage be completed? ---Yes  Related visit to physician within the last 2 weeks? ---Yes  Does the PT have any chronic conditions? (i.e. diabetes, asthma, etc.) ---Yes  List chronic conditions. ---Heart problems, CHF     Guidelines    Guideline Title Affirmed Question Affirmed Notes  Breathing Difficulty [1] MILD difficulty breathing (e.g., minimal/no SOB at rest, SOB with walking, pulse <100) AND [2] NEW-onset or WORSE than normal    Final Disposition User   See Physician within 4 Hours (or PCP triage) Mallie Mussel, RN, Alveta Heimlich    Comments  Appointment scheduled for today at 11:00 wtih Leeanne Rio PA   Referrals  REFERRED TO PCP OFFICE   Disagree/Comply: Comply

## 2015-04-11 NOTE — Progress Notes (Signed)
Pre visit review using our clinic review tool, if applicable. No additional management support is needed unless otherwise documented below in the visit note/SLS  

## 2015-04-15 ENCOUNTER — Ambulatory Visit: Payer: Medicare Other | Admitting: Gastroenterology

## 2015-04-15 ENCOUNTER — Encounter: Payer: Self-pay | Admitting: Physician Assistant

## 2015-04-16 ENCOUNTER — Encounter (HOSPITAL_COMMUNITY): Payer: Self-pay

## 2015-04-29 ENCOUNTER — Encounter: Payer: Self-pay | Admitting: Family Medicine

## 2015-04-29 ENCOUNTER — Encounter: Payer: Self-pay | Admitting: Interventional Cardiology

## 2015-04-29 ENCOUNTER — Encounter: Payer: Self-pay | Admitting: Cardiology

## 2015-04-29 ENCOUNTER — Ambulatory Visit (INDEPENDENT_AMBULATORY_CARE_PROVIDER_SITE_OTHER): Payer: Medicare Other | Admitting: Cardiology

## 2015-04-29 VITALS — BP 128/82 | HR 86 | Ht 65.5 in | Wt 170.6 lb

## 2015-04-29 DIAGNOSIS — R0602 Shortness of breath: Secondary | ICD-10-CM | POA: Diagnosis not present

## 2015-04-29 DIAGNOSIS — Z01818 Encounter for other preprocedural examination: Secondary | ICD-10-CM | POA: Diagnosis not present

## 2015-04-29 DIAGNOSIS — D689 Coagulation defect, unspecified: Secondary | ICD-10-CM | POA: Diagnosis not present

## 2015-04-29 DIAGNOSIS — I427 Cardiomyopathy due to drug and external agent: Secondary | ICD-10-CM | POA: Diagnosis not present

## 2015-04-29 DIAGNOSIS — T451X5A Adverse effect of antineoplastic and immunosuppressive drugs, initial encounter: Secondary | ICD-10-CM

## 2015-04-29 NOTE — Patient Instructions (Addendum)
Your physician has requested that you have a cardiac catheterization. Cardiac catheterization is used to diagnose and/or treat various heart conditions. Doctors may recommend this procedure for a number of different reasons. The most common reason is to evaluate chest pain. Chest pain can be a symptom of coronary artery disease (CAD), and cardiac catheterization can show whether plaque is narrowing or blocking your heart's arteries. This procedure is also used to evaluate the valves, as well as measure the blood flow and oxygen levels in different parts of your heart. For further information please visit [email or web address]. Please follow instruction sheet, as given.  Your physician recommends that you return for lab work   Your physician has requested that you have an echocardiogram. Echocardiography is a painless test that uses sound waves to create images of your heart. It provides your doctor with information about the size and shape of your heart and how well your heart's chambers and valves are working. This procedure takes approximately one hour. There are no restrictions for this procedure.
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
## 2015-04-30 ENCOUNTER — Other Ambulatory Visit: Payer: Self-pay | Admitting: *Deleted

## 2015-04-30 ENCOUNTER — Telehealth: Payer: Self-pay | Admitting: Cardiology

## 2015-04-30 DIAGNOSIS — H52203 Unspecified astigmatism, bilateral: Secondary | ICD-10-CM | POA: Diagnosis not present

## 2015-04-30 DIAGNOSIS — Z4789 Encounter for other orthopedic aftercare: Secondary | ICD-10-CM | POA: Diagnosis not present

## 2015-04-30 DIAGNOSIS — Z01818 Encounter for other preprocedural examination: Secondary | ICD-10-CM

## 2015-04-30 DIAGNOSIS — H2513 Age-related nuclear cataract, bilateral: Secondary | ICD-10-CM | POA: Diagnosis not present

## 2015-04-30 NOTE — Telephone Encounter (Signed)
She can still have the procedure.  This will not interfere.

## 2015-04-30 NOTE — Telephone Encounter (Signed)
Patient would like to speak with nurse regarding procedures scheduled for next week. /tg

## 2015-04-30 NOTE — Telephone Encounter (Signed)
Spoke with patient  Patient states she saw her orthopedic doctor today- She went to see him about her knee  He diagnosis her with infectious bursitis and cellulitis, per patient. Patient states she was started on Bactrim.  Patient was concerned whether she should continue or postpone  Cardiac cath  That is schedule for 05/08/15 with Dr Tamala Julian. She also wanted to know what  Dr Percival Spanish - wanted to do about her knee . She states he wanted to take a look at it yesterday at office appointment,but they got to talking and he did not look at the area.  RN informed patient will defer to Dr Percival Spanish, will contact patient. RN informed patient not have labs until return call She verbalized understanding.

## 2015-05-01 NOTE — Telephone Encounter (Signed)
Pt given recommendations.  She explains further that she wanted to make sure no risk of blood clot in the leg. She notes the symptoms of pain, swelling, is currently being treated for infectious bursitis/cellulitis, But notes she was sedentary for extended period following PNA and wanted to r/o possibility of thrombus. Notes she has "a lot of problems with her heart and lungs" - referring to her cardiomyopathy r/t chemotherapy med trial; pt didn't want to risk any further potential complications.  She had spoken to orthopedist about this and he had raised concerns. Did not get chance to show Dr. Percival Spanish her leg when in visit the other day.  Will route for advice on this.

## 2015-05-01 NOTE — Telephone Encounter (Signed)
[name] is returning your call [name].. Please call

## 2015-05-01 NOTE — Telephone Encounter (Signed)
Called, went to VM. LMTCB.

## 2015-05-04 ENCOUNTER — Other Ambulatory Visit: Payer: Self-pay | Admitting: Family Medicine

## 2015-05-05 DIAGNOSIS — D689 Coagulation defect, unspecified: Secondary | ICD-10-CM | POA: Diagnosis not present

## 2015-05-05 DIAGNOSIS — R5383 Other fatigue: Secondary | ICD-10-CM | POA: Diagnosis not present

## 2015-05-05 DIAGNOSIS — Z01818 Encounter for other preprocedural examination: Secondary | ICD-10-CM | POA: Diagnosis not present

## 2015-05-05 DIAGNOSIS — R0602 Shortness of breath: Secondary | ICD-10-CM | POA: Diagnosis not present

## 2015-05-06 ENCOUNTER — Ambulatory Visit (HOSPITAL_COMMUNITY): Payer: Medicare Other | Attending: Cardiovascular Disease

## 2015-05-06 ENCOUNTER — Other Ambulatory Visit: Payer: Self-pay

## 2015-05-06 DIAGNOSIS — Z8249 Family history of ischemic heart disease and other diseases of the circulatory system: Secondary | ICD-10-CM | POA: Insufficient documentation

## 2015-05-06 DIAGNOSIS — I427 Cardiomyopathy due to drug and external agent: Secondary | ICD-10-CM | POA: Diagnosis not present

## 2015-05-06 DIAGNOSIS — I071 Rheumatic tricuspid insufficiency: Secondary | ICD-10-CM | POA: Diagnosis not present

## 2015-05-06 DIAGNOSIS — I429 Cardiomyopathy, unspecified: Secondary | ICD-10-CM | POA: Diagnosis present

## 2015-05-06 DIAGNOSIS — T451X5A Adverse effect of antineoplastic and immunosuppressive drugs, initial encounter: Secondary | ICD-10-CM | POA: Diagnosis not present

## 2015-05-06 LAB — CBC
HCT: 42.4 % (ref 36.0–46.0)
Hemoglobin: 13.9 g/dL (ref 12.0–15.0)
MCH: 31.2 pg (ref 26.0–34.0)
MCHC: 32.8 g/dL (ref 30.0–36.0)
MCV: 95.1 fL (ref 78.0–100.0)
MPV: 9.6 fL (ref 8.6–12.4)
Platelets: 257 10*3/uL (ref 150–400)
RBC: 4.46 MIL/uL (ref 3.87–5.11)
RDW: 13.5 % (ref 11.5–15.5)
WBC: 4.5 10*3/uL (ref 4.0–10.5)

## 2015-05-06 LAB — COMPLETE METABOLIC PANEL WITH GFR
ALT: 24 U/L (ref 6–29)
AST: 31 U/L (ref 10–35)
Albumin: 3.9 g/dL (ref 3.6–5.1)
Alkaline Phosphatase: 108 U/L (ref 33–130)
BUN: 19 mg/dL (ref 7–25)
CO2: 27 mmol/L (ref 20–31)
Calcium: 8.9 mg/dL (ref 8.6–10.4)
Chloride: 103 mmol/L (ref 98–110)
Creat: 0.92 mg/dL (ref 0.50–1.05)
GFR, Est African American: 79 mL/min (ref >=60)
GFR, Est Non African American: 69 mL/min (ref >=60)
Glucose, Bld: 102 mg/dL — ABNORMAL HIGH (ref 65–99)
Potassium: 4.7 mmol/L (ref 3.5–5.3)
Sodium: 138 mmol/L (ref 135–146)
Total Bilirubin: 0.3 mg/dL (ref 0.2–1.2)
Total Protein: 6.1 g/dL (ref 6.1–8.1)

## 2015-05-06 LAB — PROTIME-INR
INR: 0.95 (ref <1.50)
Prothrombin Time: 12.8 seconds (ref 11.6–15.2)

## 2015-05-06 LAB — APTT: aPTT: 29 seconds (ref 24–37)

## 2015-05-06 LAB — TSH: TSH: 0.888 u[IU]/mL (ref 0.350–4.500)

## 2015-05-08 ENCOUNTER — Ambulatory Visit (HOSPITAL_COMMUNITY)
Admission: RE | Admit: 2015-05-08 | Discharge: 2015-05-08 | Disposition: A | Discharge status: Home / Self Care | Payer: Medicare Other | Source: Ambulatory Visit | Attending: Interventional Cardiology | Admitting: Interventional Cardiology

## 2015-05-08 ENCOUNTER — Encounter (HOSPITAL_COMMUNITY): Payer: Self-pay | Admitting: Interventional Cardiology

## 2015-05-08 ENCOUNTER — Other Ambulatory Visit: Payer: Self-pay | Admitting: Oncology

## 2015-05-08 ENCOUNTER — Encounter (HOSPITAL_COMMUNITY)
Admission: RE | Disposition: A | Discharge status: Home / Self Care | Payer: Self-pay | Source: Ambulatory Visit | Attending: Interventional Cardiology

## 2015-05-08 DIAGNOSIS — Z86010 Personal history of colonic polyps: Secondary | ICD-10-CM | POA: Insufficient documentation

## 2015-05-08 DIAGNOSIS — K589 Irritable bowel syndrome without diarrhea: Secondary | ICD-10-CM | POA: Insufficient documentation

## 2015-05-08 DIAGNOSIS — I2510 Atherosclerotic heart disease of native coronary artery without angina pectoris: Secondary | ICD-10-CM | POA: Diagnosis not present

## 2015-05-08 DIAGNOSIS — F4310 Post-traumatic stress disorder, unspecified: Secondary | ICD-10-CM | POA: Diagnosis not present

## 2015-05-08 DIAGNOSIS — R0789 Other chest pain: Secondary | ICD-10-CM | POA: Insufficient documentation

## 2015-05-08 DIAGNOSIS — Z9221 Personal history of antineoplastic chemotherapy: Secondary | ICD-10-CM | POA: Insufficient documentation

## 2015-05-08 DIAGNOSIS — F418 Other specified anxiety disorders: Secondary | ICD-10-CM | POA: Insufficient documentation

## 2015-05-08 DIAGNOSIS — Z01818 Encounter for other preprocedural examination: Secondary | ICD-10-CM

## 2015-05-08 DIAGNOSIS — Z853 Personal history of malignant neoplasm of breast: Secondary | ICD-10-CM | POA: Insufficient documentation

## 2015-05-08 DIAGNOSIS — I429 Cardiomyopathy, unspecified: Secondary | ICD-10-CM | POA: Diagnosis not present

## 2015-05-08 DIAGNOSIS — Z9481 Bone marrow transplant status: Secondary | ICD-10-CM | POA: Diagnosis not present

## 2015-05-08 DIAGNOSIS — R0600 Dyspnea, unspecified: Secondary | ICD-10-CM | POA: Diagnosis not present

## 2015-05-08 DIAGNOSIS — E039 Hypothyroidism, unspecified: Secondary | ICD-10-CM | POA: Insufficient documentation

## 2015-05-08 HISTORY — PX: CARDIAC CATHETERIZATION: SHX172

## 2015-05-08 LAB — POCT I-STAT 3, VENOUS BLOOD GAS (G3P V)
Acid-base deficit: 2 mmol/L (ref 0.0–2.0)
Acid-base deficit: 2 mmol/L (ref 0.0–2.0)
Acid-base deficit: 3 mmol/L — ABNORMAL HIGH (ref 0.0–2.0)
Acid-base deficit: 3 mmol/L — ABNORMAL HIGH (ref 0.0–2.0)
Acid-base deficit: 3 mmol/L — ABNORMAL HIGH (ref 0.0–2.0)
Acid-base deficit: 4 mmol/L — ABNORMAL HIGH (ref 0.0–2.0)
Acid-base deficit: 4 mmol/L — ABNORMAL HIGH (ref 0.0–2.0)
Bicarbonate: 21.7 mEq/L (ref 20.0–24.0)
Bicarbonate: 21.9 mEq/L (ref 20.0–24.0)
Bicarbonate: 21.9 mEq/L (ref 20.0–24.0)
Bicarbonate: 22.8 mEq/L (ref 20.0–24.0)
Bicarbonate: 23 mEq/L (ref 20.0–24.0)
Bicarbonate: 23 mEq/L (ref 20.0–24.0)
Bicarbonate: 23.1 mEq/L (ref 20.0–24.0)
O2 Saturation: 65 %
O2 Saturation: 70 %
O2 Saturation: 74 %
O2 Saturation: 76 %
O2 Saturation: 76 %
O2 Saturation: 77 %
O2 Saturation: 79 %
TCO2: 23 mmol/L (ref 0–100)
TCO2: 23 mmol/L (ref 0–100)
TCO2: 23 mmol/L (ref 0–100)
TCO2: 24 mmol/L (ref 0–100)
TCO2: 24 mmol/L (ref 0–100)
TCO2: 24 mmol/L (ref 0–100)
TCO2: 24 mmol/L (ref 0–100)
pCO2, Ven: 39.5 mmHg — ABNORMAL LOW (ref 45.0–50.0)
pCO2, Ven: 39.9 mmHg — ABNORMAL LOW (ref 45.0–50.0)
pCO2, Ven: 41.6 mmHg — ABNORMAL LOW (ref 45.0–50.0)
pCO2, Ven: 41.9 mmHg — ABNORMAL LOW (ref 45.0–50.0)
pCO2, Ven: 42.2 mmHg — ABNORMAL LOW (ref 45.0–50.0)
pCO2, Ven: 42.8 mmHg — ABNORMAL LOW (ref 45.0–50.0)
pCO2, Ven: 43.2 mmHg — ABNORMAL LOW (ref 45.0–50.0)
pH, Ven: 7.323 — ABNORMAL HIGH (ref 7.250–7.300)
pH, Ven: 7.325 — ABNORMAL HIGH (ref 7.250–7.300)
pH, Ven: 7.334 — ABNORMAL HIGH (ref 7.250–7.300)
pH, Ven: 7.339 — ABNORMAL HIGH (ref 7.250–7.300)
pH, Ven: 7.351 — ABNORMAL HIGH (ref 7.250–7.300)
pH, Ven: 7.351 — ABNORMAL HIGH (ref 7.250–7.300)
pH, Ven: 7.366 — ABNORMAL HIGH (ref 7.250–7.300)
pO2, Ven: 37 mmHg (ref 30.0–45.0)
pO2, Ven: 39 mmHg (ref 30.0–45.0)
pO2, Ven: 42 mmHg (ref 30.0–45.0)
pO2, Ven: 42 mmHg (ref 30.0–45.0)
pO2, Ven: 43 mmHg (ref 30.0–45.0)
pO2, Ven: 44 mmHg (ref 30.0–45.0)
pO2, Ven: 46 mmHg — ABNORMAL HIGH (ref 30.0–45.0)

## 2015-05-08 SURGERY — RIGHT HEART CATH
Anesthesia: LOCAL

## 2015-05-08 MED ORDER — SODIUM CHLORIDE 0.9 % WEIGHT BASED INFUSION: 3.0000 mL/kg/h | INTRAVENOUS | Status: AC

## 2015-05-08 MED ORDER — SODIUM CHLORIDE 0.9 % IJ SOLN: 3.0000 mL | INTRAMUSCULAR | Status: DC | PRN

## 2015-05-08 MED ORDER — HEPARIN (PORCINE) IN NACL 2-0.9 UNIT/ML-% IJ SOLN
INTRAMUSCULAR | Status: AC
  Filled 2015-05-08: qty 1000

## 2015-05-08 MED ORDER — FENTANYL CITRATE (PF) 100 MCG/2ML IJ SOLN
INTRAMUSCULAR | Status: DC | PRN
  Administered 2015-05-08 (×2): 50 ug via INTRAVENOUS

## 2015-05-08 MED ORDER — SODIUM CHLORIDE 0.9 % IV SOLN: 250.0000 mL | INTRAVENOUS | Status: DC | PRN

## 2015-05-08 MED ORDER — ACETAMINOPHEN 325 MG PO TABS: 650.0000 mg | ORAL_TABLET | ORAL | Status: DC | PRN

## 2015-05-08 MED ORDER — MIDAZOLAM HCL 2 MG/2ML IJ SOLN
INTRAMUSCULAR | Status: AC
  Filled 2015-05-08: qty 4

## 2015-05-08 MED ORDER — SODIUM CHLORIDE 0.9 % WEIGHT BASED INFUSION: 3.0000 mL/kg/h | INTRAVENOUS | Status: DC

## 2015-05-08 MED ORDER — MIDAZOLAM HCL 2 MG/2ML IJ SOLN
INTRAMUSCULAR | Status: DC | PRN
  Administered 2015-05-08 (×2): 1 mg via INTRAVENOUS

## 2015-05-08 MED ORDER — SODIUM CHLORIDE 0.9 % WEIGHT BASED INFUSION: 1.0000 mL/kg/h | INTRAVENOUS | Status: DC

## 2015-05-08 MED ORDER — FENTANYL CITRATE (PF) 100 MCG/2ML IJ SOLN
INTRAMUSCULAR | Status: AC
  Filled 2015-05-08: qty 4

## 2015-05-08 MED ORDER — LIDOCAINE HCL (PF) 1 % IJ SOLN
INTRAMUSCULAR | Status: DC | PRN
  Administered 2015-05-08: 09:00:00

## 2015-05-08 MED ORDER — ONDANSETRON HCL 4 MG/2ML IJ SOLN: 4.0000 mg | Freq: Four times a day (QID) | INTRAMUSCULAR | Status: DC | PRN

## 2015-05-08 MED ORDER — SODIUM CHLORIDE 0.9 % IJ SOLN: 3.0000 mL | Freq: Two times a day (BID) | INTRAMUSCULAR | Status: DC

## 2015-05-08 MED ORDER — LIDOCAINE HCL (PF) 1 % IJ SOLN
INTRAMUSCULAR | Status: AC
  Filled 2015-05-08: qty 30

## 2015-05-08 SURGICAL SUPPLY — 8 items
CATH SITESEER 5F MULTI A 2 (CATHETERS) ×2 IMPLANT
CATH SWAN GANZ 7F STRAIGHT (CATHETERS) ×1 IMPLANT
GUIDEWIRE .025 260CM (WIRE) ×2 IMPLANT
KIT HEART RIGHT NAMIC (KITS) ×2 IMPLANT
PACK CARDIAC CATHETERIZATION (CUSTOM PROCEDURE TRAY) ×1 IMPLANT
PROTECTION STATION PRESSURIZED (MISCELLANEOUS) ×2
SHEATH PINNACLE 7F 10CM (SHEATH) ×2 IMPLANT
STATION PROTECTION PRESSURIZED (MISCELLANEOUS) ×1 IMPLANT

## 2015-05-08 NOTE — Progress Notes (Signed)
Site area: Right groin a 7 french venous sheath was removed  Site Prior to Removal:  Level 0  Pressure Applied For 15 MINUTES    Minutes Beginning at 0920a  Manual:   Yes.    Patient Status During Pull:  stable  Post Pull Groin Site:  Level 0  Post Pull Instructions Given:  Yes.    Post Pull Pulses Present:  Yes.    Dressing Applied:  Yes.    Comments:  VS remain stable during sheath pull.

## 2015-05-08 NOTE — Discharge Instructions (Signed)
Angiogram, Care After °Refer to this sheet in the next few weeks. These instructions provide you with information about caring for yourself after your procedure. Your health care provider may also give you more specific instructions. Your treatment has been planned according to current medical practices, but problems sometimes occur. Call your health care provider if you have any problems or questions after your procedure. °WHAT TO EXPECT AFTER THE PROCEDURE °After your procedure, it is typical to have the following: °· Bruising at the catheter insertion site that usually fades within 1-2 weeks. °· Blood collecting in the tissue (hematoma) that may be painful to the touch. It should usually decrease in size and tenderness within 1-2 weeks. °HOME CARE INSTRUCTIONS °· Take medicines only as directed by your health care provider. °· You may shower 24-48 hours after the procedure or as directed by your health care provider. Remove the bandage (dressing) and gently wash the site with plain soap and water. Pat the area dry with a clean towel. Do not rub the site, because this may cause bleeding. °· Do not take baths, swim, or use a hot tub until your health care provider approves. °· Check your insertion site every day for redness, swelling, or drainage. °· Do not apply powder or lotion to the site. °· Do not lift over 10 lb (4.5 kg) for 5 days after your procedure or as directed by your health care provider. °· Ask your health care provider when it is okay to: °¨ Return to work or school. °¨ Resume usual physical activities or sports. °¨ Resume sexual activity. °· Do not drive home if you are discharged the same day as the procedure. Have someone else drive you. °· You may drive 24 hours after the procedure unless otherwise instructed by your health care provider. °· Do not operate machinery or power tools for 24 hours after the procedure or as directed by your health care provider. °· If your procedure was done as an  outpatient procedure, which means that you went home the same day as your procedure, a responsible adult should be with you for the first 24 hours after you arrive home. °· Keep all follow-up visits as directed by your health care provider. This is important. °SEEK MEDICAL CARE IF: °· You have a fever. °· You have chills. °· You have increased bleeding from the catheter insertion site. Hold pressure on the site and call 911. °SEEK IMMEDIATE MEDICAL CARE IF: °· You have unusual pain at the catheter insertion site. °· You have redness, warmth, or swelling at the catheter insertion site. °· You have drainage (other than a small amount of blood on the dressing) from the catheter insertion site. °· The catheter insertion site is bleeding, and the bleeding does not stop after 30 minutes of holding steady pressure on the site. °· The area near or just beyond the catheter insertion site becomes pale, cool, tingly, or numb. °  °This information is not intended to replace advice given to you by your health care provider. Make sure you discuss any questions you have with your health care provider. °  °Document Released: 12/31/2004 Document Revised: 07/05/2014 Document Reviewed: 11/15/2012 °Elsevier Interactive Patient Education ©2016 [employer] ° °
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
## 2015-05-08 NOTE — Interval H&P Note (Signed)
History and Physical Interval Note:  05/08/2015 8:10 AM  [name]  has presented today for surgery, with the diagnosis of sob  The various methods of treatment have been discussed with the patient and family. After consideration of risks, benefits and other options for treatment, the patient has consented to  Procedure(s): Right Heart Cath (N/A) as a surgical intervention .  The patient's history has been reviewed, patient examined, no change in status, stable for surgery.  I have reviewed the patient's chart and labs.  Questions were answered to the patient's satisfaction.     Sinclair Grooms

## 2015-05-12 ENCOUNTER — Telehealth: Payer: Self-pay | Admitting: Cardiology

## 2015-05-12 NOTE — Telephone Encounter (Signed)
Pt had echo last Tuesday and a Cath on Thursday.She wants to know if she needs a follow up appointment?

## 2015-05-12 NOTE — Telephone Encounter (Signed)
Returned call to patient.She stated she had a cardiac cath last week.Stated she does not have a return appointment and she is still sob.Appointment scheduled with Tarri Fuller PA 05/14/15 at 2:30 pm at [facility] office.

## 2015-05-14 ENCOUNTER — Telehealth: Payer: Self-pay | Admitting: *Deleted

## 2015-05-14 ENCOUNTER — Ambulatory Visit: Payer: Medicare Other | Admitting: Physician Assistant

## 2015-05-14 ENCOUNTER — Encounter: Payer: Self-pay | Admitting: Physician Assistant

## 2015-05-14 ENCOUNTER — Ambulatory Visit (INDEPENDENT_AMBULATORY_CARE_PROVIDER_SITE_OTHER): Payer: Medicare Other | Admitting: Physician Assistant

## 2015-05-14 ENCOUNTER — Ambulatory Visit (INDEPENDENT_AMBULATORY_CARE_PROVIDER_SITE_OTHER): Payer: Medicare Other | Admitting: Family

## 2015-05-14 ENCOUNTER — Encounter: Payer: Self-pay | Admitting: Family

## 2015-05-14 VITALS — BP 129/77 | HR 95 | Temp 98.1°F | Resp 16 | Ht 65.5 in | Wt 166.2 lb

## 2015-05-14 VITALS — BP 118/86 | HR 95 | Ht 65.5 in | Wt 168.2 lb

## 2015-05-14 DIAGNOSIS — N3000 Acute cystitis without hematuria: Secondary | ICD-10-CM | POA: Diagnosis not present

## 2015-05-14 DIAGNOSIS — R8290 Unspecified abnormal findings in urine: Secondary | ICD-10-CM

## 2015-05-14 DIAGNOSIS — R3915 Urgency of urination: Secondary | ICD-10-CM

## 2015-05-14 DIAGNOSIS — R0602 Shortness of breath: Secondary | ICD-10-CM

## 2015-05-14 DIAGNOSIS — Z23 Encounter for immunization: Secondary | ICD-10-CM

## 2015-05-14 LAB — POCT URINALYSIS DIPSTICK
Bilirubin, UA: NEGATIVE
Blood, UA: NEGATIVE
Glucose, UA: NEGATIVE
Ketones, UA: NEGATIVE
Nitrite, UA: NEGATIVE
Protein, UA: NEGATIVE
Spec Grav, UA: >=1.03
Urobilinogen, UA: NEGATIVE
pH, UA: 5.5

## 2015-05-14 MED ORDER — NITROFURANTOIN MONOHYD MACRO 100 MG PO CAPS: 100.0000 mg | ORAL_CAPSULE | Freq: Two times a day (BID) | ORAL | Status: DC

## 2015-05-14 NOTE — Telephone Encounter (Signed)
Noted! Thank you

## 2015-05-14 NOTE — Patient Instructions (Addendum)
Please start macrodantin.   Call if symptoms worsen or if you are not feeling better in 1 week.

## 2015-05-14 NOTE — Progress Notes (Signed)
Pre visit review using our clinic review tool, if applicable. No additional management support is needed unless otherwise documented below in the visit note. 
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
## 2015-05-14 NOTE — Addendum Note (Signed)
Addended by: Caffie Pinto on: 05/14/2015 02:51 PM   Modules accepted: Orders

## 2015-05-14 NOTE — Addendum Note (Signed)
Addended by: Kelle Darting A on: 05/14/2015 04:38 PM   Modules accepted: Orders

## 2015-05-14 NOTE — Assessment & Plan Note (Addendum)
PNA is resolved.  SOB is ongoing. Cardiac work up negative.  I advised pt to arrange follow up with her pulmonologist.  She denies current anxiety symptoms, however I think that anxiety may be a contributing factor to her symptoms.
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
## 2015-05-14 NOTE — Telephone Encounter (Signed)
[name]-- pt has cancelled appt with PCP tomorrow and wanted to let you know as she was supposed to see you after that.  Pt seen today and wanted to know if she could cancel her f/u of pneumonia with PCP tomorrow since she was told her lungs sounded fine on exam today. Per verbal from NP, [name], ok to  cancel. Appt cancelled and mychart message sent to pt.

## 2015-05-14 NOTE — Patient Instructions (Signed)
Your physician recommends that you schedule a follow-up appointment in: 3  Months with Dr. Hochrein.    

## 2015-05-15 ENCOUNTER — Ambulatory Visit: Payer: Medicare Other | Admitting: Family Medicine

## 2015-05-17 LAB — URINE CULTURE: Colony Count: 15000

## 2015-05-18 ENCOUNTER — Encounter: Payer: Self-pay | Admitting: Family

## 2015-05-19 ENCOUNTER — Other Ambulatory Visit: Payer: Self-pay | Admitting: Family Medicine

## 2015-05-21 ENCOUNTER — Telehealth: Payer: Self-pay | Admitting: Family Medicine

## 2015-05-21 MED ORDER — CIPROFLOXACIN HCL 500 MG PO TABS: 500.0000 mg | ORAL_TABLET | Freq: Two times a day (BID) | ORAL | Status: DC

## 2015-05-21 MED ORDER — CEPHALEXIN 500 MG PO CAPS: 500.0000 mg | ORAL_CAPSULE | Freq: Two times a day (BID) | ORAL | Status: DC

## 2015-05-21 NOTE — Telephone Encounter (Signed)
Cipro Rx potential interaction due to moxifloxacin allergy. Per verbal from PA, [name] has tolerated cephalexin in the past. Cancelled Cipro Rx per Barnett Applebaum at [facility] and sent Cephalexin 500mg  twice a day for 5 days.

## 2015-05-21 NOTE — Telephone Encounter (Signed)
Caller name: Self   Can be reached: [phone]  Pharmacy: [id number], [place]. [phone] (Phone) [phone] (Fax)         Reason for call: Patient states that she has completed her antibiotics and still having pain and pressure.

## 2015-05-21 NOTE — Telephone Encounter (Signed)
Culture reviewed. Ok to send in Cipro 500 mg BID x 3 days to complete course. Follow-up if symptoms still present.

## 2015-05-21 NOTE — Telephone Encounter (Signed)
Spoke with pt. States she still has pain in lower back and left rib as well as pelvic pressure and pain. Has been having chills. Still having urinary frequency. Completed macrodantin yesterday. Ok to leave detailed message on pt's cell#. Please advise?

## 2015-05-21 NOTE — Telephone Encounter (Signed)
Left message for pt to return my call.

## 2015-05-30 ENCOUNTER — Encounter: Payer: Self-pay | Admitting: Physician Assistant

## 2015-05-30 ENCOUNTER — Ambulatory Visit (HOSPITAL_BASED_OUTPATIENT_CLINIC_OR_DEPARTMENT_OTHER)
Admission: RE | Admit: 2015-05-30 | Discharge: 2015-05-30 | Disposition: A | Discharge status: Home / Self Care | Payer: Medicare Other | Source: Ambulatory Visit | Attending: Physician Assistant | Admitting: Physician Assistant

## 2015-05-30 ENCOUNTER — Ambulatory Visit (INDEPENDENT_AMBULATORY_CARE_PROVIDER_SITE_OTHER): Payer: Medicare Other | Admitting: Physician Assistant

## 2015-05-30 ENCOUNTER — Other Ambulatory Visit (HOSPITAL_COMMUNITY)
Admission: RE | Admit: 2015-05-30 | Discharge: 2015-05-30 | Disposition: A | Discharge status: Home / Self Care | Payer: Medicare Other | Source: Ambulatory Visit | Attending: Physician Assistant | Admitting: Physician Assistant

## 2015-05-30 VITALS — BP 141/62 | HR 99 | Temp 97.4°F | Resp 16 | Ht 65.5 in | Wt 168.5 lb

## 2015-05-30 DIAGNOSIS — M545 Low back pain, unspecified: Secondary | ICD-10-CM

## 2015-05-30 DIAGNOSIS — Z01419 Encounter for gynecological examination (general) (routine) without abnormal findings: Secondary | ICD-10-CM | POA: Diagnosis not present

## 2015-05-30 DIAGNOSIS — R102 Pelvic and perineal pain: Secondary | ICD-10-CM | POA: Insufficient documentation

## 2015-05-30 DIAGNOSIS — Z780 Asymptomatic menopausal state: Secondary | ICD-10-CM | POA: Diagnosis not present

## 2015-05-30 DIAGNOSIS — R399 Unspecified symptoms and signs involving the genitourinary system: Secondary | ICD-10-CM

## 2015-05-30 DIAGNOSIS — N760 Acute vaginitis: Secondary | ICD-10-CM | POA: Insufficient documentation

## 2015-05-30 DIAGNOSIS — Z853 Personal history of malignant neoplasm of breast: Secondary | ICD-10-CM | POA: Insufficient documentation

## 2015-05-30 LAB — POCT URINALYSIS DIPSTICK
Bilirubin, UA: NEGATIVE
Blood, UA: NEGATIVE
Glucose, UA: NEGATIVE
Ketones, UA: NEGATIVE
Leukocytes, UA: NEGATIVE
Nitrite, UA: NEGATIVE
Protein, UA: NEGATIVE
Spec Grav, UA: 1.025
Urobilinogen, UA: 0.2
pH, UA: 6

## 2015-05-30 NOTE — Progress Notes (Signed)
Pre visit review using our clinic review tool, if applicable. No additional management support is needed unless otherwise documented below in the visit note/SLS  
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
## 2015-05-30 NOTE — Patient Instructions (Signed)
Please take pain medications as directed. Go to your Ultrasound appointment as scheduled. I will MyChart you with your results this weekend. Stay hydrated and rest.

## 2015-05-31 ENCOUNTER — Encounter: Payer: Self-pay | Admitting: Physician Assistant

## 2015-05-31 LAB — CULTURE, URINE COMPREHENSIVE
Colony Count: NO GROWTH
Organism ID, Bacteria: NO GROWTH

## 2015-06-01 DIAGNOSIS — R102 Pelvic and perineal pain: Secondary | ICD-10-CM | POA: Insufficient documentation

## 2015-06-01 NOTE — Assessment & Plan Note (Signed)
Urine dip negative. Culture sent and negative for growth. Examination only positive for suprapubic pressure and pain. No noted cystocele, rectocele or prolapse noted. Will obtain US pelvis to further assess. Potentially an interstitial cystitis or pelvic floor dysfunction. Continue Hydrocodone but take as directed. Supportive measures reviewed. Patient to schedule follow-up with Urology. Will alter regimen/plan based on [place] results

## 2015-06-03 DIAGNOSIS — R102 Pelvic and perineal pain: Secondary | ICD-10-CM | POA: Diagnosis not present

## 2015-06-03 DIAGNOSIS — R3989 Other symptoms and signs involving the genitourinary system: Secondary | ICD-10-CM | POA: Diagnosis not present

## 2015-06-04 ENCOUNTER — Encounter: Payer: Self-pay | Admitting: Physician Assistant

## 2015-06-04 LAB — CYTOLOGY - PAP

## 2015-06-05 LAB — URINE CYTOLOGY ANCILLARY ONLY
Bacterial vaginitis: NEGATIVE
Candida vaginitis: NEGATIVE

## 2015-06-09 DIAGNOSIS — M62838 Other muscle spasm: Secondary | ICD-10-CM | POA: Diagnosis not present

## 2015-06-09 DIAGNOSIS — R102 Pelvic and perineal pain: Secondary | ICD-10-CM | POA: Diagnosis not present

## 2015-06-09 DIAGNOSIS — N3946 Mixed incontinence: Secondary | ICD-10-CM | POA: Diagnosis not present

## 2015-06-09 DIAGNOSIS — R278 Other lack of coordination: Secondary | ICD-10-CM | POA: Diagnosis not present

## 2015-06-16 DIAGNOSIS — R3989 Other symptoms and signs involving the genitourinary system: Secondary | ICD-10-CM | POA: Diagnosis not present

## 2015-06-16 DIAGNOSIS — N3946 Mixed incontinence: Secondary | ICD-10-CM | POA: Diagnosis not present

## 2015-06-16 DIAGNOSIS — R278 Other lack of coordination: Secondary | ICD-10-CM | POA: Diagnosis not present

## 2015-06-16 DIAGNOSIS — M62838 Other muscle spasm: Secondary | ICD-10-CM | POA: Diagnosis not present

## 2015-06-26 DIAGNOSIS — M62838 Other muscle spasm: Secondary | ICD-10-CM | POA: Diagnosis not present

## 2015-06-26 DIAGNOSIS — R278 Other lack of coordination: Secondary | ICD-10-CM | POA: Diagnosis not present

## 2015-06-26 DIAGNOSIS — R350 Frequency of micturition: Secondary | ICD-10-CM | POA: Diagnosis not present

## 2015-06-26 DIAGNOSIS — R102 Pelvic and perineal pain: Secondary | ICD-10-CM | POA: Diagnosis not present

## 2015-07-04 DIAGNOSIS — Z124 Encounter for screening for malignant neoplasm of cervix: Secondary | ICD-10-CM | POA: Diagnosis not present

## 2015-07-09 ENCOUNTER — Telehealth: Payer: Self-pay | Admitting: Family Medicine

## 2015-07-09 ENCOUNTER — Encounter: Payer: Self-pay | Admitting: Family Medicine

## 2015-07-09 NOTE — Telephone Encounter (Signed)
I could do 7:30 next Monday or Friday

## 2015-07-09 NOTE — Telephone Encounter (Signed)
Relation to [id number] Call back [phone]   Reason for call:  Patient would like to discuss antidepressant medication patient scheduled an appointment for 07/29/2015 and stated she does not want to see another provider, and would like to be seen much sooner then 1/31. Please advise

## 2015-07-10 DIAGNOSIS — M50322 Other cervical disc degeneration at C5-C6 level: Secondary | ICD-10-CM | POA: Diagnosis not present

## 2015-07-10 DIAGNOSIS — M5136 Other intervertebral disc degeneration, lumbar region: Secondary | ICD-10-CM | POA: Diagnosis not present

## 2015-07-10 DIAGNOSIS — Z79891 Long term (current) use of opiate analgesic: Secondary | ICD-10-CM | POA: Diagnosis not present

## 2015-07-10 NOTE — Telephone Encounter (Signed)
lvm advising patient to call back and schedule appointment.  °

## 2015-07-10 NOTE — Telephone Encounter (Signed)
Patient would like to come in Monday 07/14/15 at 7:30am. Thank You

## 2015-07-11 DIAGNOSIS — N762 Acute vulvitis: Secondary | ICD-10-CM | POA: Diagnosis not present

## 2015-07-14 ENCOUNTER — Ambulatory Visit (INDEPENDENT_AMBULATORY_CARE_PROVIDER_SITE_OTHER): Payer: Medicare Other | Admitting: Family Medicine

## 2015-07-14 ENCOUNTER — Encounter: Payer: Self-pay | Admitting: Family Medicine

## 2015-07-14 ENCOUNTER — Telehealth: Payer: Self-pay | Admitting: *Deleted

## 2015-07-14 VITALS — BP 108/64 | HR 90 | Temp 98.2°F | Ht 66.0 in | Wt 172.2 lb

## 2015-07-14 DIAGNOSIS — K219 Gastro-esophageal reflux disease without esophagitis: Secondary | ICD-10-CM | POA: Diagnosis not present

## 2015-07-14 DIAGNOSIS — Z853 Personal history of malignant neoplasm of breast: Secondary | ICD-10-CM

## 2015-07-14 DIAGNOSIS — F418 Other specified anxiety disorders: Secondary | ICD-10-CM

## 2015-07-14 DIAGNOSIS — N3010 Interstitial cystitis (chronic) without hematuria: Secondary | ICD-10-CM | POA: Diagnosis not present

## 2015-07-14 DIAGNOSIS — G4700 Insomnia, unspecified: Secondary | ICD-10-CM

## 2015-07-14 HISTORY — DX: Interstitial cystitis (chronic) without hematuria: N30.10

## 2015-07-14 MED ORDER — FLUOXETINE HCL 20 MG PO TABS: 10.0000 mg | ORAL_TABLET | Freq: Every day | ORAL | Status: DC

## 2015-07-14 MED ORDER — METHYLPHENIDATE HCL 10 MG PO TABS: 10.0000 mg | ORAL_TABLET | Freq: Every day | ORAL | Status: DC | PRN

## 2015-07-14 NOTE — Telephone Encounter (Signed)
Received fax from pharmacy requesting PA on Fluoxetine; Initiated via Cover My Meds; awaiting response/SLS 01/16
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
## 2015-07-14 NOTE — Assessment & Plan Note (Signed)
Patient very tearful today. Was recently seen by her OB/GYN and diagnosed with new palpable breast lesions. Agrees to contact breast center and request follow up prior to her 6 month follow up that is scheduled

## 2015-07-14 NOTE — Assessment & Plan Note (Signed)
Has been diagnosed by urology Dr Dorina Hoyer but has been hesitant to start Urobil after reading potential side effects. Encouraged to maintain probiotics, good hydration and add a low dose zinc supplement. May consider adding meds or new referral if symptoms continue to worsen

## 2015-07-14 NOTE — Progress Notes (Signed)
Pre visit review using our clinic review tool, if applicable. No additional management support is needed unless otherwise documented below in the visit note. 

## 2015-07-14 NOTE — Patient Instructions (Signed)
Major Depressive Disorder Major depressive disorder is a mental illness. It also may be called clinical depression or unipolar depression. Major depressive disorder usually causes feelings of sadness, hopelessness, or helplessness. Some people with this disorder do not feel particularly sad but lose interest in doing things they used to enjoy (anhedonia). Major depressive disorder also can cause physical symptoms. It can interfere with work, school, relationships, and other normal everyday activities. The disorder varies in severity but is longer lasting and more serious than the sadness we all feel from time to time in our lives. Major depressive disorder often is triggered by stressful life events or major life changes. Examples of these triggers include divorce, loss of your job or home, a move, and the death of a family member or close friend. Sometimes this disorder occurs for no obvious reason at all. People who have family members with major depressive disorder or bipolar disorder are at higher risk for developing this disorder, with or without life stressors. Major depressive disorder can occur at any age. It may occur just once in your life (single episode major depressive disorder). It may occur multiple times (recurrent major depressive disorder). SYMPTOMS People with major depressive disorder have either anhedonia or depressed mood on nearly a daily basis for at least 2 weeks or longer. Symptoms of depressed mood include:  Feelings of sadness (blue or down in the dumps) or emptiness.  Feelings of hopelessness or helplessness.  Tearfulness or episodes of crying (may be observed by others).  Irritability (children and adolescents). In addition to depressed mood or anhedonia or both, people with this disorder have at least four of the following symptoms:  Difficulty sleeping or sleeping too much.   Significant change (increase or decrease) in appetite or weight.   Lack of energy or  motivation.  Feelings of guilt and worthlessness.   Difficulty concentrating, remembering, or making decisions.  Unusually slow movement (psychomotor retardation) or restlessness (as observed by others).   Recurrent wishes for death, recurrent thoughts of self-harm (suicide), or a suicide attempt. People with major depressive disorder commonly have persistent negative thoughts about themselves, other people, and the world. People with severe major depressive disorder may experiencedistorted beliefs or perceptions about the world (psychotic delusions). They also may see or hear things that are not real (psychotic hallucinations). DIAGNOSIS Major depressive disorder is diagnosed through an assessment by your health care provider. Your health care provider will ask aboutaspects of your daily life, such as mood,sleep, and appetite, to see if you have the diagnostic symptoms of major depressive disorder. Your health care provider may ask about your medical history and use of alcohol or drugs, including prescription medicines. Your health care provider also may do a physical exam and blood work. This is because certain medical conditions and the use of certain substances can cause major depressive disorder-like symptoms (secondary depression). Your health care provider also may refer you to a mental health specialist for further evaluation and treatment. TREATMENT It is important to recognize the symptoms of major depressive disorder and seek treatment. The following treatments can be prescribed for this disorder:   Medicine. Antidepressant medicines usually are prescribed. Antidepressant medicines are thought to correct chemical imbalances in the brain that are commonly associated with major depressive disorder. Other types of medicine may be added if the symptoms do not respond to antidepressant medicines alone or if psychotic delusions or hallucinations occur.  Talk therapy. Talk therapy can be  helpful in treating major depressive disorder by providing   support, education, and guidance. Certain types of talk therapy also can help with negative thinking (cognitive behavioral therapy) and with relationship issues that trigger this disorder (interpersonal therapy). A mental health specialist can help determine which treatment is best for you. Most people with major depressive disorder do well with a combination of medicine and talk therapy. Treatments involving electrical stimulation of the brain can be used in situations with extremely severe symptoms or when medicine and talk therapy do not work over time. These treatments include electroconvulsive therapy, transcranial magnetic stimulation, and vagal nerve stimulation.   This information is not intended to replace advice given to you by your health care provider. Make sure you discuss any questions you have with your health care provider.   Document Released: 10/09/2012 Document Revised: 07/05/2014 Document Reviewed: 10/09/2012 Elsevier Interactive Patient Education 2016 Elsevier Inc.  

## 2015-07-15 ENCOUNTER — Telehealth: Payer: Self-pay | Admitting: Family Medicine

## 2015-07-15 MED ORDER — FLUOXETINE HCL 20 MG PO CAPS
20.0000 mg | ORAL_CAPSULE | Freq: Every day | ORAL | Status: DC
Start: 2015-07-15 — End: 2015-10-28

## 2015-07-15 NOTE — Telephone Encounter (Signed)
You and/or Dr. Charlett Blake can resend in capsule form, but I do have an Appeal in process [for the Depression diagnosis]/SLS

## 2015-07-15 NOTE — Telephone Encounter (Addendum)
Spoke with Oris Drone about PA Denial and ICD code, she informed me that there can only be one Medicare Denial and that I have to call for an Appeal/SLS

## 2015-07-15 NOTE — Telephone Encounter (Signed)
Sent in prozac capsules to her local pharmacy as PCP instructed.  Patient notified of change.

## 2015-07-15 NOTE — Telephone Encounter (Signed)
Caller name: [employer]  Can be reached: [phone]   Reason for call: [facility] called stating that Prozac tablets are not cover by patients insurance. Prozac capsules are covered and that is what the patient had gotten before. Plse send new rx for capsules and notify patient if there is a reason for tablets being ordered.

## 2015-07-15 NOTE — Telephone Encounter (Signed)
Called [employer] via [facility] and spoke with representative Enid Derry who took down all of my Appeal information; Reason: invalid diagnosis processed electronically with ICD-10 code F41.8, correct diagnosis in EMR id Depression with Anxiety, only the Anxiety Disorder processed, not the depression, which is what caused the Denial. I asked for an expedited appeal process and she assured me that my request was being forwarded to [place] for a 72-Hr turn around time for response; Confirmation # [id number]

## 2015-07-15 NOTE — Telephone Encounter (Signed)
Received Denial d/t Depression not showing in ICD-10 Code given in patient's EMR; will call [facility] when they open/SLS

## 2015-07-15 NOTE — Telephone Encounter (Signed)
OK to switch her to Prozac capsule 20 mg, 1 20 mg capsule daily disp #30 with 3 rf and d/c tabs

## 2015-07-16 ENCOUNTER — Telehealth: Payer: Self-pay

## 2015-07-16 NOTE — Telephone Encounter (Signed)
Received more paperwork to be completed for Appeals process; provider changed Rx from tablet to capsule, per pharmacy, Insurance covered the change in form for patient/SLS

## 2015-07-16 NOTE — Telephone Encounter (Signed)
Opened in error

## 2015-07-16 NOTE — Telephone Encounter (Signed)
[facility] called regarding below.  It was explained that other medications have been tried and failed.  No further needs voiced.  PA still in process.

## 2015-07-17 ENCOUNTER — Telehealth: Payer: Self-pay | Admitting: Family Medicine

## 2015-07-17 ENCOUNTER — Other Ambulatory Visit: Payer: Self-pay | Admitting: Obstetrics & Gynecology

## 2015-07-17 DIAGNOSIS — N6320 Unspecified lump in the left breast, unspecified quadrant: Secondary | ICD-10-CM

## 2015-07-17 DIAGNOSIS — N6310 Unspecified lump in the right breast, unspecified quadrant: Secondary | ICD-10-CM

## 2015-07-17 NOTE — Telephone Encounter (Signed)
FYI: [facility] called to inform Dr. Charlett Blake that Prozac has been approved for this patient.

## 2015-07-20 NOTE — Assessment & Plan Note (Signed)
Encouraged good sleep hygiene such as dark, quiet room. No blue/green glowing lights such as computer screens in bedroom. No alcohol or stimulants in evening. Cut down on caffeine as able. Regular exercise is helpful but not just prior to bed time.  

## 2015-07-20 NOTE — Assessment & Plan Note (Addendum)
Patient tearful due to recent illness and difficult social situation. She agrees to referral to behavioral health. Will switch to Fluoxetine and given Adderal for her extreme fatigue from her depression.

## 2015-07-20 NOTE — Assessment & Plan Note (Signed)
Avoid offending foods, start probiotics. Do not eat large meals in late evening and consider raising head of bed.  

## 2015-07-21 ENCOUNTER — Ambulatory Visit (INDEPENDENT_AMBULATORY_CARE_PROVIDER_SITE_OTHER): Payer: 59 | Admitting: Psychology

## 2015-07-21 DIAGNOSIS — F331 Major depressive disorder, recurrent, moderate: Secondary | ICD-10-CM | POA: Diagnosis not present

## 2015-07-21 NOTE — Telephone Encounter (Signed)
The following note should have been referred to: Call Documentation      Rudene Anda, RN at 07/16/2015 11:13 AM     Status: Signed         [facility] called regarding below. It was explained that other medications have been tried and failed. No further needs voiced. PA still in process.           Rockwell Germany, CMA at 07/16/2015 9:16 AM     Status: Signed        Received more paperwork to be completed for Appeals process; provider changed Rx from tablet to capsule, per pharmacy, Insurance covered the change in form for patient/SLS

## 2015-07-22 ENCOUNTER — Ambulatory Visit
Admission: RE | Admit: 2015-07-22 | Discharge: 2015-07-22 | Disposition: A | Discharge status: Home / Self Care | Payer: Medicare Other | Source: Ambulatory Visit | Attending: Obstetrics & Gynecology | Admitting: Obstetrics & Gynecology

## 2015-07-22 ENCOUNTER — Other Ambulatory Visit: Payer: Self-pay | Admitting: Obstetrics & Gynecology

## 2015-07-22 DIAGNOSIS — N6310 Unspecified lump in the right breast, unspecified quadrant: Secondary | ICD-10-CM

## 2015-07-22 DIAGNOSIS — N6320 Unspecified lump in the left breast, unspecified quadrant: Secondary | ICD-10-CM

## 2015-07-22 DIAGNOSIS — N6489 Other specified disorders of breast: Secondary | ICD-10-CM | POA: Diagnosis not present

## 2015-07-22 DIAGNOSIS — N63 Unspecified lump in breast: Secondary | ICD-10-CM | POA: Diagnosis not present

## 2015-07-22 DIAGNOSIS — N6012 Diffuse cystic mastopathy of left breast: Secondary | ICD-10-CM | POA: Diagnosis not present

## 2015-07-23 ENCOUNTER — Telehealth: Payer: Self-pay | Admitting: *Deleted

## 2015-07-23 NOTE — Telephone Encounter (Signed)
Message left by pt stating need to schedule an appointment with MD due to new concerns.  Pt stated she is having a biopsy tomorrow.  Per review of chart noted pt has hx of breast cancer dx 1994 and was released from follow up in 2015.  Noted breast images and scheduled biopsy 1/26.  This note will be given to MD for review and appropriate appointment date.

## 2015-07-24 ENCOUNTER — Ambulatory Visit
Admission: RE | Admit: 2015-07-24 | Discharge: 2015-07-24 | Disposition: A | Discharge status: Home / Self Care | Payer: Medicare Other | Source: Ambulatory Visit | Attending: Obstetrics & Gynecology | Admitting: Obstetrics & Gynecology

## 2015-07-24 ENCOUNTER — Other Ambulatory Visit: Payer: Self-pay | Admitting: Obstetrics & Gynecology

## 2015-07-24 DIAGNOSIS — N6002 Solitary cyst of left breast: Secondary | ICD-10-CM

## 2015-07-24 DIAGNOSIS — N6320 Unspecified lump in the left breast, unspecified quadrant: Secondary | ICD-10-CM

## 2015-07-25 ENCOUNTER — Telehealth: Payer: Self-pay | Admitting: *Deleted

## 2015-07-25 NOTE — Telephone Encounter (Signed)
This RN obtained reading per biopsy per abnormal reading on mammogram.  Per reading- biospy not required due to area of concern aspirated completely and noted as a " cyst ".  This RN spoke with pt today per above and her request for visit.  Per discussion including pt " I am so happy that it was only a cyst "-plan is to schedule appointment with MD for next available to discuss concerns she has and how to be followed best.  No other concerns at present.  POF sent to scheduling.

## 2015-07-28 ENCOUNTER — Telehealth: Payer: Self-pay | Admitting: Oncology

## 2015-07-28 NOTE — Telephone Encounter (Signed)
Spoke to patient to confirm appointment date/time for 2/27 per 1/27 pof.

## 2015-07-28 NOTE — Telephone Encounter (Signed)
Received Approval for Fluoxetine valid 07/14/2015 through 06/27/2016, authorization [id number]; faxed to pharmacy, sent to scan/SLS 01/30

## 2015-07-29 ENCOUNTER — Ambulatory Visit: Payer: Medicare Other | Admitting: Family Medicine

## 2015-07-30 ENCOUNTER — Ambulatory Visit (INDEPENDENT_AMBULATORY_CARE_PROVIDER_SITE_OTHER): Payer: 59 | Admitting: Psychology

## 2015-07-30 DIAGNOSIS — F331 Major depressive disorder, recurrent, moderate: Secondary | ICD-10-CM

## 2015-08-02 ENCOUNTER — Encounter: Payer: Self-pay | Admitting: Family Medicine

## 2015-08-04 ENCOUNTER — Other Ambulatory Visit: Payer: Self-pay | Admitting: Family Medicine

## 2015-08-04 MED ORDER — ALPRAZOLAM 0.5 MG PO TABS: 0.5000 mg | ORAL_TABLET | Freq: Three times a day (TID) | ORAL | Status: DC | PRN

## 2015-08-04 NOTE — Telephone Encounter (Signed)
c 

## 2015-08-04 NOTE — Telephone Encounter (Signed)
[place]

## 2015-08-04 NOTE — Telephone Encounter (Signed)
Faxed hardcopy for Alprazolam to  

## 2015-08-06 ENCOUNTER — Ambulatory Visit: Payer: Medicare Other | Admitting: Oncology

## 2015-08-07 ENCOUNTER — Encounter (HOSPITAL_COMMUNITY): Payer: Self-pay

## 2015-08-07 ENCOUNTER — Emergency Department (HOSPITAL_COMMUNITY): Payer: Medicare Other

## 2015-08-07 ENCOUNTER — Emergency Department (HOSPITAL_COMMUNITY)
Admission: EM | Admit: 2015-08-07 | Discharge: 2015-08-08 | Disposition: A | Discharge status: Home / Self Care | Payer: Medicare Other | Attending: Emergency Medicine | Admitting: Emergency Medicine

## 2015-08-07 ENCOUNTER — Telehealth: Payer: Self-pay | Admitting: Family Medicine

## 2015-08-07 DIAGNOSIS — F419 Anxiety disorder, unspecified: Secondary | ICD-10-CM | POA: Insufficient documentation

## 2015-08-07 DIAGNOSIS — F329 Major depressive disorder, single episode, unspecified: Secondary | ICD-10-CM | POA: Diagnosis not present

## 2015-08-07 DIAGNOSIS — E039 Hypothyroidism, unspecified: Secondary | ICD-10-CM | POA: Diagnosis not present

## 2015-08-07 DIAGNOSIS — Z9889 Other specified postprocedural states: Secondary | ICD-10-CM | POA: Insufficient documentation

## 2015-08-07 DIAGNOSIS — R110 Nausea: Secondary | ICD-10-CM | POA: Diagnosis not present

## 2015-08-07 DIAGNOSIS — Z8701 Personal history of pneumonia (recurrent): Secondary | ICD-10-CM | POA: Insufficient documentation

## 2015-08-07 DIAGNOSIS — Q269 Congenital malformation of great vein, unspecified: Secondary | ICD-10-CM | POA: Insufficient documentation

## 2015-08-07 DIAGNOSIS — Z79899 Other long term (current) drug therapy: Secondary | ICD-10-CM | POA: Diagnosis not present

## 2015-08-07 DIAGNOSIS — R197 Diarrhea, unspecified: Secondary | ICD-10-CM | POA: Diagnosis not present

## 2015-08-07 DIAGNOSIS — I2510 Atherosclerotic heart disease of native coronary artery without angina pectoris: Secondary | ICD-10-CM | POA: Insufficient documentation

## 2015-08-07 DIAGNOSIS — M1990 Unspecified osteoarthritis, unspecified site: Secondary | ICD-10-CM | POA: Insufficient documentation

## 2015-08-07 DIAGNOSIS — Z973 Presence of spectacles and contact lenses: Secondary | ICD-10-CM | POA: Insufficient documentation

## 2015-08-07 DIAGNOSIS — Z87828 Personal history of other (healed) physical injury and trauma: Secondary | ICD-10-CM | POA: Diagnosis not present

## 2015-08-07 DIAGNOSIS — R1012 Left upper quadrant pain: Secondary | ICD-10-CM | POA: Diagnosis not present

## 2015-08-07 DIAGNOSIS — Z86010 Personal history of colonic polyps: Secondary | ICD-10-CM | POA: Diagnosis not present

## 2015-08-07 DIAGNOSIS — Z853 Personal history of malignant neoplasm of breast: Secondary | ICD-10-CM | POA: Diagnosis not present

## 2015-08-07 DIAGNOSIS — Z87448 Personal history of other diseases of urinary system: Secondary | ICD-10-CM | POA: Diagnosis not present

## 2015-08-07 DIAGNOSIS — R1084 Generalized abdominal pain: Secondary | ICD-10-CM | POA: Diagnosis not present

## 2015-08-07 DIAGNOSIS — R109 Unspecified abdominal pain: Secondary | ICD-10-CM

## 2015-08-07 DIAGNOSIS — R1032 Left lower quadrant pain: Secondary | ICD-10-CM | POA: Insufficient documentation

## 2015-08-07 DIAGNOSIS — Z8719 Personal history of other diseases of the digestive system: Secondary | ICD-10-CM | POA: Diagnosis not present

## 2015-08-07 LAB — COMPREHENSIVE METABOLIC PANEL
ALT: 20 U/L (ref 14–54)
AST: 34 U/L (ref 15–41)
Albumin: 4.7 g/dL (ref 3.5–5.0)
Alkaline Phosphatase: 106 U/L (ref 38–126)
Anion gap: 13 (ref 5–15)
BUN: 14 mg/dL (ref 6–20)
CO2: 22 mmol/L (ref 22–32)
Calcium: 9.5 mg/dL (ref 8.9–10.3)
Chloride: 105 mmol/L (ref 101–111)
Creatinine, Ser: 0.98 mg/dL (ref 0.44–1.00)
GFR calc Af Amer: >60 mL/min (ref >60)
GFR calc non Af Amer: >60 mL/min (ref >60)
Glucose, Bld: 126 mg/dL — ABNORMAL HIGH (ref 65–99)
Potassium: 3.6 mmol/L (ref 3.5–5.1)
Sodium: 140 mmol/L (ref 135–145)
Total Bilirubin: 0.9 mg/dL (ref 0.3–1.2)
Total Protein: 7.6 g/dL (ref 6.5–8.1)

## 2015-08-07 LAB — CBC
HCT: 44.6 % (ref 36.0–46.0)
Hemoglobin: 15.3 g/dL — ABNORMAL HIGH (ref 12.0–15.0)
MCH: 31.5 pg (ref 26.0–34.0)
MCHC: 34.3 g/dL (ref 30.0–36.0)
MCV: 92 fL (ref 78.0–100.0)
Platelets: 267 10*3/uL (ref 150–400)
RBC: 4.85 MIL/uL (ref 3.87–5.11)
RDW: 12.6 % (ref 11.5–15.5)
WBC: 9 10*3/uL (ref 4.0–10.5)

## 2015-08-07 LAB — URINALYSIS, ROUTINE W REFLEX MICROSCOPIC
Bilirubin Urine: NEGATIVE
Glucose, UA: NEGATIVE mg/dL
Ketones, ur: NEGATIVE mg/dL
Nitrite: NEGATIVE
Protein, ur: NEGATIVE mg/dL
Specific Gravity, Urine: 1.014 (ref 1.005–1.030)
pH: 5.5 (ref 5.0–8.0)

## 2015-08-07 LAB — URINE MICROSCOPIC-ADD ON: RBC / HPF: NONE SEEN RBC/hpf (ref 0–5)

## 2015-08-07 LAB — LIPASE, BLOOD: Lipase: 25 U/L (ref 11–51)

## 2015-08-07 MED ORDER — METOCLOPRAMIDE HCL 5 MG/ML IJ SOLN
10.0000 mg | Freq: Once | INTRAMUSCULAR | Status: AC
  Administered 2015-08-07: 10 mg via INTRAVENOUS
  Filled 2015-08-07: qty 2

## 2015-08-07 MED ORDER — IOHEXOL 300 MG/ML  SOLN
100.0000 mL | Freq: Once | INTRAMUSCULAR | Status: AC | PRN
  Administered 2015-08-07: 100 mL via INTRAVENOUS

## 2015-08-07 MED ORDER — FUROSEMIDE 40 MG PO TABS
40.0000 mg | ORAL_TABLET | Freq: Once | ORAL | Status: AC
Start: 2015-08-07 — End: 2015-08-07
  Administered 2015-08-07: 40 mg via ORAL
  Filled 2015-08-07: qty 1

## 2015-08-07 MED ORDER — SODIUM CHLORIDE 0.9 % IV BOLUS (SEPSIS)
1000.0000 mL | Freq: Once | INTRAVENOUS | Status: AC
  Administered 2015-08-07: 1000 mL via INTRAVENOUS

## 2015-08-07 NOTE — ED Notes (Signed)
Pt states flank pain and diarrhea since last Friday.  Nausea with no fever.  Taking electrolytes at home but not working.  No change in urination.  Slight left flank pain.

## 2015-08-07 NOTE — Telephone Encounter (Signed)
For swelling have her minimize sodium, elevate feet above heart for 15 minutes twice daily, try compression hose such as Jobst light weight knee highs, on in am off in pm. Can order on line or go to a medical supply store. For the diarrhea. Need to know how many a day, any blood, how many day, pain? Should probably have her come in to have stool cultures run. Order stool for CDiff, o and p, culture and lactoferrin. Make sure she is taking probiotics, fiber such as benefiber bid and plenty of fluids especially some gatorade or pedialyte every day

## 2015-08-07 NOTE — Telephone Encounter (Signed)
The patient has had diarrhea since last Friday.  She has been taking lasix due to ankle swelling.  She thinks she may be dehydrated, has increased water and gatorade.  BP today 135/87. 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  Uncertain if it is an illness or just stress/nerves.
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
## 2015-08-08 ENCOUNTER — Other Ambulatory Visit: Payer: Self-pay

## 2015-08-08 ENCOUNTER — Other Ambulatory Visit: Payer: Self-pay | Admitting: Family Medicine

## 2015-08-08 DIAGNOSIS — R197 Diarrhea, unspecified: Secondary | ICD-10-CM | POA: Diagnosis not present

## 2015-08-08 DIAGNOSIS — R1084 Generalized abdominal pain: Secondary | ICD-10-CM | POA: Diagnosis not present

## 2015-08-08 MED ORDER — DICYCLOMINE HCL 20 MG PO TABS: 20.0000 mg | ORAL_TABLET | Freq: Two times a day (BID) | ORAL | Status: DC

## 2015-08-08 MED ORDER — ONDANSETRON 4 MG PO TBDP: 4.0000 mg | ORAL_TABLET | Freq: Three times a day (TID) | ORAL | Status: DC | PRN

## 2015-08-08 MED ORDER — DICYCLOMINE HCL 10 MG PO CAPS
10.0000 mg | ORAL_CAPSULE | Freq: Once | ORAL | Status: AC
Start: 2015-08-08 — End: 2015-08-08
  Administered 2015-08-08: 10 mg via ORAL
  Filled 2015-08-08: qty 1

## 2015-08-08 NOTE — Telephone Encounter (Signed)
So she does not need blood work anymore just the stool cultures I mentioned

## 2015-08-08 NOTE — ED Notes (Signed)
Patient d/c'd self care.  F/U and medications discussed.  Patient verbalized understanding. 

## 2015-08-08 NOTE — Telephone Encounter (Signed)
Spoke to the patient as it turns out she went to the ER last night.  I have informed her of your instructions, but have not ordered any lab work yet until you are able to review what was done at the ER. The patient did state she was going every hour, but doing better now.

## 2015-08-08 NOTE — Telephone Encounter (Signed)
Patient notified and orders in the computer

## 2015-08-08 NOTE — Telephone Encounter (Signed)
Called left msg. To call back 

## 2015-08-08 NOTE — Discharge Instructions (Signed)
Your labs today were completely normal. Your CT scan was also normal. Your symptoms are most likely due to a virus. However, if your symptoms persist please return to the ER or follow up with your primary care provider. Either way, please follow up with your primary care provider within one week. In the meantime I will give you prescriptions for Bentyl (for abdominal pain) and Zofran (for nausea). Drink plenty of fluids. Stick to a simple diet until your symptoms improve.    Diarrhea Diarrhea is frequent loose and watery bowel movements. It can cause you to feel weak and dehydrated. Dehydration can cause you to become tired and thirsty, have a dry mouth, and have decreased urination that often is dark yellow. Diarrhea is a sign of another problem, most often an infection that will not last long. In most cases, diarrhea typically lasts 2-3 days. However, it can last longer if it is a sign of something more serious. It is important to treat your diarrhea as directed by your caregiver to lessen or prevent future episodes of diarrhea. CAUSES  Some common causes include:  Gastrointestinal infections caused by viruses, bacteria, or parasites.  Food poisoning or food allergies.  Certain medicines, such as antibiotics, chemotherapy, and laxatives.  Artificial sweeteners and fructose.  Digestive disorders. HOME CARE INSTRUCTIONS  Ensure adequate fluid intake (hydration): Have 1 cup (8 oz) of fluid for each diarrhea episode. Avoid fluids that contain simple sugars or sports drinks, fruit juices, whole milk products, and sodas. Your urine should be clear or pale yellow if you are drinking enough fluids. Hydrate with an oral rehydration solution that you can purchase at pharmacies, retail stores, and online. You can prepare an oral rehydration solution at home by mixing the following ingredients together:   - tsp table salt.   tsp baking soda.   tsp salt substitute containing potassium chloride.  1   tablespoons sugar.  1 L (34 oz) of water.  Certain foods and beverages may increase the speed at which food moves through the gastrointestinal (GI) tract. These foods and beverages should be avoided and include:  Caffeinated and alcoholic beverages.  High-fiber foods, such as raw fruits and vegetables, nuts, seeds, and whole grain breads and cereals.  Foods and beverages sweetened with sugar alcohols, such as xylitol, sorbitol, and mannitol.  Some foods may be well tolerated and may help thicken stool including:  Starchy foods, such as rice, toast, pasta, low-sugar cereal, oatmeal, grits, baked potatoes, crackers, and bagels.  Bananas.  Applesauce.  Add probiotic-rich foods to help increase healthy bacteria in the GI tract, such as yogurt and fermented milk products.  Wash your hands well after each diarrhea episode.  Only take over-the-counter or prescription medicines as directed by your caregiver.  Take a warm bath to relieve any burning or pain from frequent diarrhea episodes. SEEK IMMEDIATE MEDICAL CARE IF:   You are unable to keep fluids down.  You have persistent vomiting.  You have blood in your stool, or your stools are black and tarry.  You do not urinate in 6-8 hours, or there is only a small amount of very dark urine.  You have abdominal pain that increases or localizes.  You have weakness, dizziness, confusion, or light-headedness.  You have a severe headache.  Your diarrhea gets worse or does not get better.  You have a fever or persistent symptoms for more than 2-3 days.  You have a fever and your symptoms suddenly get worse. MAKE SURE YOU:  Understand these instructions.  Will watch your condition.  Will get help right away if you are not doing well or get worse.   This information is not intended to replace advice given to you by your health care provider. Make sure you discuss any questions you have with your health care provider.     Document Released: 06/04/2002 Document Revised: 07/05/2014 Document Reviewed: 02/20/2012 Elsevier Interactive Patient Education [employer].

## 2015-08-09 LAB — URINE CULTURE

## 2015-08-09 LAB — CLOSTRIDIUM DIFFICILE BY PCR: Toxigenic C. Difficile by PCR: NOT DETECTED

## 2015-08-10 LAB — FECAL LACTOFERRIN, QUANT: Lactoferrin: NEGATIVE

## 2015-08-11 ENCOUNTER — Ambulatory Visit: Payer: 59 | Admitting: Psychology

## 2015-08-12 LAB — OVA AND PARASITE EXAMINATION: OP: NONE SEEN

## 2015-08-13 LAB — STOOL CULTURE

## 2015-08-13 NOTE — Progress Notes (Signed)
No show

## 2015-08-14 ENCOUNTER — Encounter: Payer: Medicare Other | Admitting: Cardiology

## 2015-08-20 DIAGNOSIS — M5136 Other intervertebral disc degeneration, lumbar region: Secondary | ICD-10-CM | POA: Diagnosis not present

## 2015-08-25 ENCOUNTER — Ambulatory Visit (INDEPENDENT_AMBULATORY_CARE_PROVIDER_SITE_OTHER): Payer: Medicare Other | Admitting: Family Medicine

## 2015-08-25 ENCOUNTER — Telehealth: Payer: Self-pay | Admitting: Oncology

## 2015-08-25 ENCOUNTER — Encounter: Payer: Self-pay | Admitting: Family Medicine

## 2015-08-25 ENCOUNTER — Ambulatory Visit (HOSPITAL_BASED_OUTPATIENT_CLINIC_OR_DEPARTMENT_OTHER): Payer: Medicare Other | Admitting: Oncology

## 2015-08-25 VITALS — BP 125/71 | HR 92 | Temp 97.4°F | Resp 18 | Ht 66.0 in | Wt 169.5 lb

## 2015-08-25 VITALS — BP 112/64 | HR 99 | Temp 98.6°F | Ht 66.0 in | Wt 170.0 lb

## 2015-08-25 DIAGNOSIS — E785 Hyperlipidemia, unspecified: Secondary | ICD-10-CM | POA: Diagnosis not present

## 2015-08-25 DIAGNOSIS — E039 Hypothyroidism, unspecified: Secondary | ICD-10-CM

## 2015-08-25 DIAGNOSIS — K219 Gastro-esophageal reflux disease without esophagitis: Secondary | ICD-10-CM | POA: Diagnosis not present

## 2015-08-25 DIAGNOSIS — R928 Other abnormal and inconclusive findings on diagnostic imaging of breast: Secondary | ICD-10-CM | POA: Diagnosis not present

## 2015-08-25 DIAGNOSIS — F418 Other specified anxiety disorders: Secondary | ICD-10-CM | POA: Diagnosis not present

## 2015-08-25 DIAGNOSIS — R197 Diarrhea, unspecified: Secondary | ICD-10-CM

## 2015-08-25 DIAGNOSIS — Z853 Personal history of malignant neoplasm of breast: Secondary | ICD-10-CM | POA: Diagnosis not present

## 2015-08-25 DIAGNOSIS — C50811 Malignant neoplasm of overlapping sites of right female breast: Secondary | ICD-10-CM
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
## 2015-08-25 NOTE — Patient Instructions (Signed)
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
## 2015-08-25 NOTE — Telephone Encounter (Signed)
Gave patient avs report and appointments March. Patient to call [facility] to schedule breast mri - information provided.

## 2015-08-25 NOTE — Assessment & Plan Note (Signed)
On Wellbutrin and Fluoxetine. Did not get any increased energy or lift in mood and did have some palpitations on Ritalin so she stopped it after 3 doses.

## 2015-08-25 NOTE — Progress Notes (Signed)
Pre visit review using our clinic review tool, if applicable. No additional management support is needed unless otherwise documented below in the visit note. 

## 2015-08-26 LAB — TSH: TSH: 2.69 u[IU]/mL (ref 0.35–4.50)

## 2015-08-27 ENCOUNTER — Other Ambulatory Visit: Payer: Self-pay

## 2015-08-27 ENCOUNTER — Telehealth: Payer: Self-pay

## 2015-08-27 DIAGNOSIS — C50811 Malignant neoplasm of overlapping sites of right female breast: Secondary | ICD-10-CM

## 2015-08-27 NOTE — Telephone Encounter (Signed)
Patient called regarding order in EPIC being incorrect for [facility] to schedule patient for MRI of the breast.  Writer spoke with [facility] who states order must read w and w/o contrast.  Writer placed new order, faxed to [facility] and LVM with patient that she should be hearing from them to schedule patient's appt.

## 2015-08-31 NOTE — Assessment & Plan Note (Signed)
On Levothyroxine, continue to monitor 

## 2015-08-31 NOTE — Assessment & Plan Note (Signed)
Encouraged heart healthy diet, increase exercise, avoid trans fats, consider a krill oil cap daily 

## 2015-08-31 NOTE — Assessment & Plan Note (Signed)
Avoid offending foods, start probiotics. Do not eat large meals in late evening and consider raising head of bed.  

## 2015-09-01 ENCOUNTER — Ambulatory Visit: Payer: Medicare Other | Admitting: Emergency Medicine

## 2015-09-03 ENCOUNTER — Ambulatory Visit
Admission: RE | Admit: 2015-09-03 | Discharge: 2015-09-03 | Disposition: A | Discharge status: Home / Self Care | Payer: Medicare Other | Source: Ambulatory Visit | Attending: Oncology | Admitting: Oncology

## 2015-09-03 DIAGNOSIS — N6002 Solitary cyst of left breast: Secondary | ICD-10-CM | POA: Diagnosis not present

## 2015-09-03 DIAGNOSIS — N6001 Solitary cyst of right breast: Secondary | ICD-10-CM | POA: Diagnosis not present

## 2015-09-03 DIAGNOSIS — C50811 Malignant neoplasm of overlapping sites of right female breast: Secondary | ICD-10-CM

## 2015-09-03 MED ORDER — GADOBENATE DIMEGLUMINE 529 MG/ML IV SOLN
15.0000 mL | Freq: Once | INTRAVENOUS | Status: AC | PRN
  Administered 2015-09-03: 15 mL via INTRAVENOUS

## 2015-09-04 ENCOUNTER — Telehealth: Payer: Self-pay

## 2015-09-04 NOTE — Telephone Encounter (Signed)
Pt questioning AVS - advised her the diagnosis on the AVS was from her history.  Pt voiced understanding.

## 2015-09-05 ENCOUNTER — Encounter (HOSPITAL_BASED_OUTPATIENT_CLINIC_OR_DEPARTMENT_OTHER): Payer: Self-pay | Admitting: Emergency Medicine

## 2015-09-05 ENCOUNTER — Emergency Department (HOSPITAL_BASED_OUTPATIENT_CLINIC_OR_DEPARTMENT_OTHER)
Admission: EM | Admit: 2015-09-05 | Discharge: 2015-09-05 | Disposition: A | Discharge status: Home / Self Care | Payer: Medicare Other | Attending: Emergency Medicine | Admitting: Emergency Medicine

## 2015-09-05 DIAGNOSIS — Z973 Presence of spectacles and contact lenses: Secondary | ICD-10-CM | POA: Insufficient documentation

## 2015-09-05 DIAGNOSIS — R1110 Vomiting, unspecified: Secondary | ICD-10-CM | POA: Diagnosis not present

## 2015-09-05 DIAGNOSIS — R42 Dizziness and giddiness: Secondary | ICD-10-CM | POA: Diagnosis not present

## 2015-09-05 DIAGNOSIS — Z8781 Personal history of (healed) traumatic fracture: Secondary | ICD-10-CM | POA: Insufficient documentation

## 2015-09-05 DIAGNOSIS — F419 Anxiety disorder, unspecified: Secondary | ICD-10-CM | POA: Diagnosis not present

## 2015-09-05 DIAGNOSIS — F329 Major depressive disorder, single episode, unspecified: Secondary | ICD-10-CM | POA: Insufficient documentation

## 2015-09-05 DIAGNOSIS — Z8709 Personal history of other diseases of the respiratory system: Secondary | ICD-10-CM | POA: Diagnosis not present

## 2015-09-05 DIAGNOSIS — I2510 Atherosclerotic heart disease of native coronary artery without angina pectoris: Secondary | ICD-10-CM | POA: Insufficient documentation

## 2015-09-05 DIAGNOSIS — R197 Diarrhea, unspecified: Secondary | ICD-10-CM | POA: Diagnosis not present

## 2015-09-05 DIAGNOSIS — K589 Irritable bowel syndrome without diarrhea: Secondary | ICD-10-CM | POA: Diagnosis not present

## 2015-09-05 DIAGNOSIS — R52 Pain, unspecified: Secondary | ICD-10-CM | POA: Diagnosis not present

## 2015-09-05 DIAGNOSIS — Z853 Personal history of malignant neoplasm of breast: Secondary | ICD-10-CM | POA: Diagnosis not present

## 2015-09-05 DIAGNOSIS — Z8701 Personal history of pneumonia (recurrent): Secondary | ICD-10-CM | POA: Insufficient documentation

## 2015-09-05 DIAGNOSIS — Z87448 Personal history of other diseases of urinary system: Secondary | ICD-10-CM | POA: Diagnosis not present

## 2015-09-05 DIAGNOSIS — Z79899 Other long term (current) drug therapy: Secondary | ICD-10-CM | POA: Insufficient documentation

## 2015-09-05 DIAGNOSIS — Z8739 Personal history of other diseases of the musculoskeletal system and connective tissue: Secondary | ICD-10-CM | POA: Insufficient documentation

## 2015-09-05 DIAGNOSIS — R112 Nausea with vomiting, unspecified: Secondary | ICD-10-CM | POA: Diagnosis not present

## 2015-09-05 DIAGNOSIS — Z86010 Personal history of colonic polyps: Secondary | ICD-10-CM | POA: Insufficient documentation

## 2015-09-05 DIAGNOSIS — Q269 Congenital malformation of great vein, unspecified: Secondary | ICD-10-CM | POA: Insufficient documentation

## 2015-09-05 DIAGNOSIS — Z9889 Other specified postprocedural states: Secondary | ICD-10-CM | POA: Diagnosis not present

## 2015-09-05 DIAGNOSIS — E039 Hypothyroidism, unspecified: Secondary | ICD-10-CM | POA: Diagnosis not present

## 2015-09-05 LAB — URINALYSIS, ROUTINE W REFLEX MICROSCOPIC
Bilirubin Urine: NEGATIVE
Glucose, UA: NEGATIVE mg/dL
Hgb urine dipstick: NEGATIVE
Ketones, ur: 40 mg/dL — AB
Leukocytes, UA: NEGATIVE
Nitrite: NEGATIVE
Protein, ur: NEGATIVE mg/dL
Specific Gravity, Urine: 1.005 (ref 1.005–1.030)
pH: 5.5 (ref 5.0–8.0)

## 2015-09-05 LAB — COMPREHENSIVE METABOLIC PANEL
ALT: 22 U/L (ref 14–54)
AST: 34 U/L (ref 15–41)
Albumin: 4.7 g/dL (ref 3.5–5.0)
Alkaline Phosphatase: 120 U/L (ref 38–126)
Anion gap: 14 (ref 5–15)
BUN: 19 mg/dL (ref 6–20)
CO2: 22 mmol/L (ref 22–32)
Calcium: 9.2 mg/dL (ref 8.9–10.3)
Chloride: 101 mmol/L (ref 101–111)
Creatinine, Ser: 1 mg/dL (ref 0.44–1.00)
GFR calc Af Amer: >60 mL/min (ref >60)
GFR calc non Af Amer: >60 mL/min (ref >60)
Glucose, Bld: 83 mg/dL (ref 65–99)
Potassium: 3.5 mmol/L (ref 3.5–5.1)
Sodium: 137 mmol/L (ref 135–145)
Total Bilirubin: 1.3 mg/dL — ABNORMAL HIGH (ref 0.3–1.2)
Total Protein: 7.8 g/dL (ref 6.5–8.1)

## 2015-09-05 LAB — CBC WITH DIFFERENTIAL/PLATELET
Basophils Absolute: 0 10*3/uL (ref 0.0–0.1)
Basophils Relative: 1 %
Eosinophils Absolute: 0.1 10*3/uL (ref 0.0–0.7)
Eosinophils Relative: 1 %
HCT: 46.3 % — ABNORMAL HIGH (ref 36.0–46.0)
Hemoglobin: 15.8 g/dL — ABNORMAL HIGH (ref 12.0–15.0)
Lymphocytes Relative: 23 %
Lymphs Abs: 2 10*3/uL (ref 0.7–4.0)
MCH: 31.4 pg (ref 26.0–34.0)
MCHC: 34.1 g/dL (ref 30.0–36.0)
MCV: 92 fL (ref 78.0–100.0)
Monocytes Absolute: 0.7 10*3/uL (ref 0.1–1.0)
Monocytes Relative: 8 %
Neutro Abs: 5.9 10*3/uL (ref 1.7–7.7)
Neutrophils Relative %: 67 %
Platelets: 248 10*3/uL (ref 150–400)
RBC: 5.03 MIL/uL (ref 3.87–5.11)
RDW: 12.6 % (ref 11.5–15.5)
WBC: 8.7 10*3/uL (ref 4.0–10.5)

## 2015-09-05 LAB — LIPASE, BLOOD: Lipase: 25 U/L (ref 11–51)

## 2015-09-05 LAB — TROPONIN I: Troponin I: <0.03 ng/mL (ref <0.031)

## 2015-09-05 MED ORDER — SODIUM CHLORIDE 0.9 % IV BOLUS (SEPSIS)
1000.0000 mL | Freq: Once | INTRAVENOUS | Status: AC
  Administered 2015-09-05: 1000 mL via INTRAVENOUS

## 2015-09-05 MED ORDER — ONDANSETRON HCL 4 MG/2ML IJ SOLN
4.0000 mg | Freq: Once | INTRAMUSCULAR | Status: AC
  Administered 2015-09-05: 4 mg via INTRAVENOUS
  Filled 2015-09-05: qty 2

## 2015-09-05 MED ORDER — SODIUM CHLORIDE 0.9 % IV SOLN
INTRAVENOUS | Status: DC
Start: 2015-09-05 — End: 2015-09-05
  Administered 2015-09-05: 17:00:00 via INTRAVENOUS

## 2015-09-05 NOTE — ED Notes (Signed)
Assisted to BR. Ambulated without difficulty.

## 2015-09-05 NOTE — Discharge Instructions (Signed)
Diarrhea  Diarrhea is watery poop (stool). It can make you feel weak, tired, thirsty, or give you a dry mouth (signs of dehydration). Watery poop is a sign of another problem, most often an infection. It often lasts 2-3 days. It can last longer if it is a sign of something serious. Take care of yourself as told by your doctor.  HOME CARE   · Drink 1 cup (8 ounces) of fluid each time you have watery poop.  · Do not drink the following fluids:    Those that contain simple sugars (fructose, glucose, galactose, lactose, sucrose, maltose).    Sports drinks.    Fruit juices.    Whole milk products.    Sodas.    Drinks with caffeine (coffee, tea, soda) or alcohol.  · Oral rehydration solution may be used if the doctor says it is okay. You may make your own solution. Follow this recipe:    ?-? teaspoon table salt.    ¾ teaspoon baking soda.    ? teaspoon salt substitute containing potassium chloride.    1 ? tablespoons sugar.    1 liter (34 ounces) of water.  · Avoid the following foods:    High fiber foods, such as raw fruits and vegetables.    Nuts, seeds, and whole grain breads and cereals.     Those that are sweetened with sugar alcohols (xylitol, sorbitol, mannitol).  · Try eating the following foods:    Starchy foods, such as rice, toast, pasta, low-sugar cereal, oatmeal, baked potatoes, crackers, and bagels.    Bananas.    Applesauce.  · Eat probiotic-rich foods, such as yogurt and milk products that are fermented.  · Wash your hands well after each time you have watery poop.  · Only take medicine as told by your doctor.  · Take a warm bath to help lessen burning or pain from having watery poop.  GET HELP RIGHT AWAY IF:   · You cannot drink fluids without throwing up (vomiting).  · You keep throwing up.  · You have blood in your poop, or your poop looks black and tarry.  · You do not pee (urinate) in 6-8 hours, or there is only a small amount of very dark pee.  · You have belly (abdominal) pain that gets worse or  stays in the same spot (localizes).  · You are weak, dizzy, confused, or light-headed.  · You have a very bad headache.  · Your watery poop gets worse or does not get better.  · You have a fever or lasting symptoms for more than 2-3 days.  · You have a fever and your symptoms suddenly get worse.  MAKE SURE YOU:   · Understand these instructions.  · Will watch your condition.  · Will get help right away if you are not doing well or get worse.     This information is not intended to replace advice given to you by your health care provider. Make sure you discuss any questions you have with your health care provider.     Document Released: 12/01/2007 Document Revised: 07/05/2014 Document Reviewed: 02/20/2012  Elsevier Interactive Patient Education ©2016 [employer]

## 2015-09-05 NOTE — ED Notes (Addendum)
Patient states that she is having dizziness, and lightheaded. Patient reports that she is dehydrated. The patient also having numbness to her left arm starting since she arrived here. Patient is very anxious. The patient also reports that she feels like her HR is beating fast. Patient reports that this is the second episode of diarrhea in 2 months

## 2015-09-05 NOTE — ED Notes (Signed)
Pt states she had an MRI on Wed and thought she was dehydrated then. Near syncopal episode at that time. Seen at [facility] ED about a month ago for diarrhea lasting 10 days. Neg for C-diff. Monday onset diarrhea. Left arm started tingling on her way here. Mild left rib pain. Had CAT scan of abd/pelvis.
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
## 2015-09-05 NOTE — ED Notes (Signed)
Patient placed on cardiac monitor. Pt placed on auto vitals Q30.  

## 2015-09-05 NOTE — ED Notes (Signed)
Pt given d/c instructions as per chart. Verbalizes understanding. No questions. 

## 2015-09-09 ENCOUNTER — Encounter: Payer: Self-pay | Admitting: Cardiology

## 2015-09-09 ENCOUNTER — Ambulatory Visit (INDEPENDENT_AMBULATORY_CARE_PROVIDER_SITE_OTHER): Payer: Medicare Other | Admitting: Cardiology

## 2015-09-09 VITALS — BP 140/80 | HR 88 | Ht 65.0 in | Wt 171.0 lb

## 2015-09-09 DIAGNOSIS — R000 Tachycardia, unspecified: Secondary | ICD-10-CM | POA: Diagnosis not present
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
## 2015-09-09 NOTE — Patient Instructions (Signed)
Your physician wants you to follow-up in: 1 Year. You will receive a reminder letter in the mail two months in advance. If you don't receive a letter, please call our office to schedule the follow-up appointment.  

## 2015-09-16 DIAGNOSIS — L821 Other seborrheic keratosis: Secondary | ICD-10-CM | POA: Diagnosis not present

## 2015-09-16 DIAGNOSIS — L814 Other melanin hyperpigmentation: Secondary | ICD-10-CM | POA: Diagnosis not present

## 2015-09-16 DIAGNOSIS — L820 Inflamed seborrheic keratosis: Secondary | ICD-10-CM | POA: Diagnosis not present

## 2015-09-17 ENCOUNTER — Other Ambulatory Visit (HOSPITAL_BASED_OUTPATIENT_CLINIC_OR_DEPARTMENT_OTHER): Payer: Medicare Other

## 2015-09-17 ENCOUNTER — Other Ambulatory Visit: Payer: Self-pay | Admitting: Oncology

## 2015-09-17 DIAGNOSIS — C50811 Malignant neoplasm of overlapping sites of right female breast: Secondary | ICD-10-CM

## 2015-09-17 DIAGNOSIS — R928 Other abnormal and inconclusive findings on diagnostic imaging of breast: Secondary | ICD-10-CM | POA: Diagnosis not present

## 2015-09-17 DIAGNOSIS — Z853 Personal history of malignant neoplasm of breast: Secondary | ICD-10-CM

## 2015-09-17 LAB — COMPREHENSIVE METABOLIC PANEL
ALT: 20 U/L (ref 0–55)
AST: 24 U/L (ref 5–34)
Albumin: 3.8 g/dL (ref 3.5–5.0)
Alkaline Phosphatase: 127 U/L (ref 40–150)
Anion Gap: 9 mEq/L (ref 3–11)
BUN: 16.1 mg/dL (ref 7.0–26.0)
CO2: 25 mEq/L (ref 22–29)
Calcium: 8.7 mg/dL (ref 8.4–10.4)
Chloride: 106 mEq/L (ref 98–109)
Creatinine: 0.9 mg/dL (ref 0.6–1.1)
EGFR: 71 mL/min/{1.73_m2} — ABNORMAL LOW (ref >90)
Glucose: 99 mg/dl (ref 70–140)
Potassium: 4.2 mEq/L (ref 3.5–5.1)
Sodium: 139 mEq/L (ref 136–145)
Total Bilirubin: 0.59 mg/dL (ref 0.20–1.20)
Total Protein: 6.9 g/dL (ref 6.4–8.3)

## 2015-09-17 LAB — CBC WITH DIFFERENTIAL/PLATELET
BASO%: 1.3 % (ref 0.0–2.0)
Basophils Absolute: 0.1 10*3/uL (ref 0.0–0.1)
EOS%: 1.8 % (ref 0.0–7.0)
Eosinophils Absolute: 0.1 10*3/uL (ref 0.0–0.5)
HCT: 43.9 % (ref 34.8–46.6)
HGB: 14.4 g/dL (ref 11.6–15.9)
LYMPH%: 30.9 % (ref 14.0–49.7)
MCH: 31.1 pg (ref 25.1–34.0)
MCHC: 32.9 g/dL (ref 31.5–36.0)
MCV: 94.5 fL (ref 79.5–101.0)
MONO#: 0.4 10*3/uL (ref 0.1–0.9)
MONO%: 9.1 % (ref 0.0–14.0)
NEUT#: 2.6 10*3/uL (ref 1.5–6.5)
NEUT%: 56.9 % (ref 38.4–76.8)
Platelets: 251 10*3/uL (ref 145–400)
RBC: 4.64 10*6/uL (ref 3.70–5.45)
RDW: 13.9 % (ref 11.2–14.5)
WBC: 4.6 10*3/uL (ref 3.9–10.3)
lymph#: 1.4 10*3/uL (ref 0.9–3.3)

## 2015-09-17 NOTE — Progress Notes (Signed)
Writer called [facility] for patient's last bone density scan, requested that it be faxed over for review with Dr. [name] for patient's appt on Friday.

## 2015-09-18 LAB — VITAMIN D 25 HYDROXY (VIT D DEFICIENCY, FRACTURES): Vitamin D, 25-Hydroxy: 15.7 ng/mL — ABNORMAL LOW (ref 30.0–100.0)

## 2015-09-19 ENCOUNTER — Telehealth: Payer: Self-pay | Admitting: Oncology

## 2015-09-19 ENCOUNTER — Ambulatory Visit (HOSPITAL_BASED_OUTPATIENT_CLINIC_OR_DEPARTMENT_OTHER): Payer: Medicare Other | Admitting: Oncology

## 2015-09-19 VITALS — BP 130/88 | HR 110 | Temp 97.5°F | Resp 18 | Ht 65.0 in | Wt 165.8 lb

## 2015-09-19 DIAGNOSIS — Z853 Personal history of malignant neoplasm of breast: Secondary | ICD-10-CM

## 2015-09-19 DIAGNOSIS — M8580 Other specified disorders of bone density and structure, unspecified site: Secondary | ICD-10-CM | POA: Diagnosis not present

## 2015-09-19 DIAGNOSIS — R197 Diarrhea, unspecified: Secondary | ICD-10-CM | POA: Diagnosis not present

## 2015-09-19 DIAGNOSIS — C50811 Malignant neoplasm of overlapping sites of right female breast: Secondary | ICD-10-CM
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
## 2015-09-19 NOTE — Telephone Encounter (Signed)
appt made and avs printed °

## 2015-09-22 ENCOUNTER — Other Ambulatory Visit: Payer: Self-pay | Admitting: Obstetrics & Gynecology

## 2015-09-22 DIAGNOSIS — M8580 Other specified disorders of bone density and structure, unspecified site: Secondary | ICD-10-CM

## 2015-10-02 DIAGNOSIS — R109 Unspecified abdominal pain: Secondary | ICD-10-CM | POA: Diagnosis not present

## 2015-10-02 DIAGNOSIS — A09 Infectious gastroenteritis and colitis, unspecified: Secondary | ICD-10-CM | POA: Diagnosis not present

## 2015-10-04 ENCOUNTER — Encounter: Payer: Self-pay | Admitting: Family Medicine

## 2015-10-06 ENCOUNTER — Ambulatory Visit
Admission: RE | Admit: 2015-10-06 | Discharge: 2015-10-06 | Disposition: A | Discharge status: Home / Self Care | Payer: Medicare Other | Source: Ambulatory Visit | Attending: Obstetrics & Gynecology | Admitting: Obstetrics & Gynecology

## 2015-10-06 DIAGNOSIS — Z780 Asymptomatic menopausal state: Secondary | ICD-10-CM | POA: Diagnosis not present

## 2015-10-06 DIAGNOSIS — M8589 Other specified disorders of bone density and structure, multiple sites: Secondary | ICD-10-CM | POA: Diagnosis not present

## 2015-10-06 DIAGNOSIS — M8580 Other specified disorders of bone density and structure, unspecified site: Secondary | ICD-10-CM

## 2015-10-07 ENCOUNTER — Telehealth: Payer: Self-pay | Admitting: Physician Assistant

## 2015-10-07 ENCOUNTER — Encounter: Payer: Self-pay | Admitting: Physician Assistant

## 2015-10-07 ENCOUNTER — Ambulatory Visit (INDEPENDENT_AMBULATORY_CARE_PROVIDER_SITE_OTHER): Payer: Medicare Other | Admitting: Physician Assistant

## 2015-10-07 ENCOUNTER — Ambulatory Visit (HOSPITAL_BASED_OUTPATIENT_CLINIC_OR_DEPARTMENT_OTHER)
Admission: RE | Admit: 2015-10-07 | Discharge: 2015-10-07 | Disposition: A | Discharge status: Home / Self Care | Payer: Medicare Other | Source: Ambulatory Visit | Attending: Physician Assistant | Admitting: Physician Assistant

## 2015-10-07 VITALS — BP 124/70 | HR 77 | Temp 98.3°F | Ht 65.0 in | Wt 167.2 lb

## 2015-10-07 DIAGNOSIS — F419 Anxiety disorder, unspecified: Principal | ICD-10-CM

## 2015-10-07 DIAGNOSIS — R0781 Pleurodynia: Secondary | ICD-10-CM

## 2015-10-07 DIAGNOSIS — R937 Abnormal findings on diagnostic imaging of other parts of musculoskeletal system: Secondary | ICD-10-CM | POA: Diagnosis not present

## 2015-10-07 DIAGNOSIS — E559 Vitamin D deficiency, unspecified: Secondary | ICD-10-CM

## 2015-10-07 DIAGNOSIS — F32A Depression, unspecified: Secondary | ICD-10-CM

## 2015-10-07 DIAGNOSIS — F418 Other specified anxiety disorders: Secondary | ICD-10-CM

## 2015-10-07 DIAGNOSIS — Z853 Personal history of malignant neoplasm of breast: Secondary | ICD-10-CM | POA: Insufficient documentation

## 2015-10-07 DIAGNOSIS — R5383 Other fatigue: Secondary | ICD-10-CM | POA: Diagnosis not present

## 2015-10-07 DIAGNOSIS — F329 Major depressive disorder, single episode, unspecified: Secondary | ICD-10-CM

## 2015-10-07 LAB — CBC
HCT: 41.8 % (ref 36.0–46.0)
Hemoglobin: 13.9 g/dL (ref 12.0–15.0)
MCHC: 33.3 g/dL (ref 30.0–36.0)
MCV: 93.9 fl (ref 78.0–100.0)
Platelets: 236 10*3/uL (ref 150.0–400.0)
RBC: 4.45 Mil/uL (ref 3.87–5.11)
RDW: 14.2 % (ref 11.5–15.5)
WBC: 6 10*3/uL (ref 4.0–10.5)

## 2015-10-07 LAB — COMPREHENSIVE METABOLIC PANEL
ALT: 17 U/L (ref 0–35)
AST: 24 U/L (ref 0–37)
Albumin: 4 g/dL (ref 3.5–5.2)
Alkaline Phosphatase: 101 U/L (ref 39–117)
BUN: 12 mg/dL (ref 6–23)
CO2: 30 mEq/L (ref 19–32)
Calcium: 9.1 mg/dL (ref 8.4–10.5)
Chloride: 102 mEq/L (ref 96–112)
Creatinine, Ser: 0.89 mg/dL (ref 0.40–1.20)
GFR: 69.11 mL/min (ref >60.00)
Glucose, Bld: 90 mg/dL (ref 70–99)
Potassium: 4.1 mEq/L (ref 3.5–5.1)
Sodium: 137 mEq/L (ref 135–145)
Total Bilirubin: 0.5 mg/dL (ref 0.2–1.2)
Total Protein: 6.6 g/dL (ref 6.0–8.3)

## 2015-10-07 MED ORDER — MIRTAZAPINE 15 MG PO TBDP: 15.0000 mg | ORAL_TABLET | Freq: Every day | ORAL | Status: DC

## 2015-10-07 MED ORDER — VITAMIN D (ERGOCALCIFEROL) 1.25 MG (50000 UNIT) PO CAPS: 50000.0000 [IU] | ORAL_CAPSULE | ORAL | Status: DC

## 2015-10-07 NOTE — Patient Instructions (Addendum)
Please fluoxetine every other day for 1 week before stopping. Start the Remeron nightly taking 1/2 tablet nightly for 1 week before increasing to 1 tablet nightly.  Please go to the lab for blood work. Then go downstairs for imaging. I will call you with your results.  Please continue chronic medications as directed.  Follow-up with me of Dr. Charlett Blake in 2 weeks.

## 2015-10-07 NOTE — Progress Notes (Signed)
Pre visit review using our clinic review tool, if applicable. No additional management support is needed unless otherwise documented below in the visit note. 

## 2015-10-07 NOTE — Telephone Encounter (Signed)
My apologies. I have sent in Rx

## 2015-10-07 NOTE — Telephone Encounter (Signed)
Caller name: Self  Can be reached: [phone] Pharmacy:  [id number], [place]. [phone] (Phone) [phone] (Fax)         Reason for call: Patient states she was informed at her visit today that she needed Vit D but it was not called in to the pharmacy. Plse adv

## 2015-10-09 ENCOUNTER — Encounter: Payer: Self-pay | Admitting: Physician Assistant

## 2015-10-16 ENCOUNTER — Other Ambulatory Visit: Payer: Self-pay | Admitting: Oncology

## 2015-10-16 ENCOUNTER — Telehealth: Payer: Self-pay | Admitting: Family Medicine

## 2015-10-16 DIAGNOSIS — C50811 Malignant neoplasm of overlapping sites of right female breast: Secondary | ICD-10-CM

## 2015-10-16 NOTE — Progress Notes (Unsigned)
Received the following note from Dr. Randel Pigg physician's assistant:   Hi Dr. [name],   I saw one of your patient's yesterday for multiple issues. She mentioned that she had been having pain in left lower ribs. Denies any recent trauma or injury. Exam was good but giving her history of malignancy, her symptoms and her history of significant anxiety, I decided to get an x-ray. I am sending you the x-ray impression to gather your thoughts on whether or not you would recommend further imaging. I appreciate your time.   "IMPRESSION:  There is subtle cortical irregularity of the extreme anterior aspect  of the left eighth rib. This is likely posttraumatic. The underlying  bone does not exhibit abnormal mineralization. Further evaluation  with nuclear bone scanning could be considered given the persistent  symptoms and the patient's history breast malignancy."   Respectfully,  [name], PA-C    I called [name] today. She has no new symptoms. She tells me she is "not biting my nails" but would like this resolved. We are going to start with a bone scan as suggested. I went ahead and placed the order.

## 2015-10-16 NOTE — Telephone Encounter (Signed)
Dr. [name] has sent me a response. He agrees to proceed with scan to be safe. He has ordered the test and spoke with patient.  I did call her to see how she was to confirm she had been scheduled for the test.

## 2015-10-16 NOTE — Telephone Encounter (Signed)
Pt notified of below and verbalized understanding. She questions how they can be certain that the irregularity described is post-traumatic vs something else. She would like to go ahead and proceed w/ imaging w/o waiting for response from Dr. [name] and appreciates call back from [facility] tomorrow. Please call pt on her cell.   CB #: [phone]

## 2015-10-16 NOTE — Telephone Encounter (Signed)
Pt called in because she says that during her appt the provider mentioned her having some internal bruising. Pt didn't really want to go to in detail. She would like to know if Einar Pheasant has spoken with her Oncologist ?    Please assist further.  CB: [phone]

## 2015-10-16 NOTE — Telephone Encounter (Signed)
Patient does not have internal bruising. There was an irregularity noted at the tip of her eighth rib that was from an old trauma but giving her history of breast cancer, they felt a nuclear scan may be needed. Had spoken with patient to let her know I wanted to consult Dr. [name] on this. I have messaged him but still have not heard a response. I have discussed this with her PCP (Dr. Charlett Blake) who recommends giving her history we proceed with further imaging just to be on the safe side since we have not heard from Oncology. I will call her tomorrow when I am back in the office -- I am happy to proceed with imaging unless she wants to call and speak with Dr. [name] personally before doing so.

## 2015-10-20 ENCOUNTER — Other Ambulatory Visit: Payer: Self-pay | Admitting: Family Medicine

## 2015-10-20 NOTE — Telephone Encounter (Signed)
Last refill was on 08/04/2015  #90 with 1 refill Last OV 08/25/2015

## 2015-10-21 NOTE — Telephone Encounter (Signed)
Faxed hardcopy for alprazolam to [facility]

## 2015-10-27 DIAGNOSIS — F4323 Adjustment disorder with mixed anxiety and depressed mood: Secondary | ICD-10-CM | POA: Insufficient documentation

## 2015-10-27 DIAGNOSIS — R5383 Other fatigue: Secondary | ICD-10-CM | POA: Insufficient documentation

## 2015-10-27 DIAGNOSIS — R0781 Pleurodynia: Secondary | ICD-10-CM | POA: Insufficient documentation

## 2015-10-27 DIAGNOSIS — R0789 Other chest pain: Secondary | ICD-10-CM | POA: Insufficient documentation

## 2015-10-27 DIAGNOSIS — E559 Vitamin D deficiency, unspecified: Secondary | ICD-10-CM | POA: Insufficient documentation
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
## 2015-10-27 NOTE — Assessment & Plan Note (Signed)
Atraumatic. History of breast cancer. Will check x-ray to further assess.

## 2015-10-27 NOTE — Assessment & Plan Note (Signed)
Side effect of Fluoxetine. Will wean off of SSRI and titrate up on Remeron. Continue Wellbutrin as directed. FU with PCP as scheduled.

## 2015-10-27 NOTE — Assessment & Plan Note (Signed)
Chronic. Multifactorial. We are making changes to her depression regimen. Will check lab panel today.

## 2015-10-28 ENCOUNTER — Ambulatory Visit (INDEPENDENT_AMBULATORY_CARE_PROVIDER_SITE_OTHER): Payer: Medicare Other | Admitting: Family Medicine

## 2015-10-28 ENCOUNTER — Encounter (HOSPITAL_COMMUNITY): Payer: Medicare Other

## 2015-10-28 ENCOUNTER — Encounter: Payer: Self-pay | Admitting: Family Medicine

## 2015-10-28 ENCOUNTER — Encounter (HOSPITAL_COMMUNITY): Payer: Self-pay

## 2015-10-28 VITALS — BP 140/80 | HR 82 | Temp 98.1°F | Ht 65.0 in | Wt 172.0 lb

## 2015-10-28 DIAGNOSIS — M899 Disorder of bone, unspecified: Secondary | ICD-10-CM | POA: Diagnosis not present

## 2015-10-28 DIAGNOSIS — E039 Hypothyroidism, unspecified: Secondary | ICD-10-CM

## 2015-10-28 DIAGNOSIS — F4323 Adjustment disorder with mixed anxiety and depressed mood: Secondary | ICD-10-CM

## 2015-10-28 DIAGNOSIS — F32A Depression, unspecified: Secondary | ICD-10-CM

## 2015-10-28 DIAGNOSIS — Z853 Personal history of malignant neoplasm of breast: Secondary | ICD-10-CM | POA: Diagnosis not present

## 2015-10-28 DIAGNOSIS — K219 Gastro-esophageal reflux disease without esophagitis: Secondary | ICD-10-CM

## 2015-10-28 DIAGNOSIS — R197 Diarrhea, unspecified: Secondary | ICD-10-CM

## 2015-10-28 DIAGNOSIS — E559 Vitamin D deficiency, unspecified: Secondary | ICD-10-CM

## 2015-10-28 DIAGNOSIS — F418 Other specified anxiety disorders: Secondary | ICD-10-CM

## 2015-10-28 DIAGNOSIS — E785 Hyperlipidemia, unspecified: Secondary | ICD-10-CM

## 2015-10-28 DIAGNOSIS — F329 Major depressive disorder, single episode, unspecified: Secondary | ICD-10-CM

## 2015-10-28 DIAGNOSIS — F419 Anxiety disorder, unspecified: Secondary | ICD-10-CM

## 2015-10-28 HISTORY — DX: Disorder of bone, unspecified: M89.9

## 2015-10-28 MED ORDER — FLUOXETINE HCL 10 MG PO CAPS: 10.0000 mg | ORAL_CAPSULE | Freq: Every day | ORAL | Status: DC

## 2015-10-28 MED ORDER — MIRTAZAPINE 15 MG PO TBDP: 15.0000 mg | ORAL_TABLET | Freq: Every day | ORAL | Status: DC

## 2015-10-28 NOTE — Assessment & Plan Note (Signed)
On Levothyroxine, continue to monitor 

## 2015-10-28 NOTE — Assessment & Plan Note (Signed)
Continue supplementation  ?

## 2015-10-28 NOTE — Progress Notes (Signed)
Pre visit review using our clinic review tool, if applicable. No additional management support is needed unless otherwise documented below in the visit note. 

## 2015-10-28 NOTE — Assessment & Plan Note (Signed)
Avoid offending foods, start probiotics. Do not eat large meals in late evening and consider raising head of bed.  

## 2015-10-28 NOTE — Assessment & Plan Note (Signed)
Encouraged heart healthy diet, increase exercise, avoid trans fats, consider a krill oil cap daily 

## 2015-10-28 NOTE — Assessment & Plan Note (Addendum)
Has been doing a [facility] [employer] course with her church and it has been very helpful. Has stopped Prozac, she felt it made her feel badly with left flank pain and significant diarrhea with bloating. Is not better since stopping the Prozac. Remeron caused over sedation.

## 2015-10-28 NOTE — Assessment & Plan Note (Signed)
Patient has come off the prozac but diarrhea is no better. Restart Prozac at 10 mg daily, cut remeron in 1/2 and take qhs due to excessive sedation.

## 2015-10-28 NOTE — Assessment & Plan Note (Signed)
Patient is scheduled for a total body bone scan tomorrow. Await results

## 2015-10-28 NOTE — Assessment & Plan Note (Addendum)
Significant on Prozac, has seen her gastroenterology, Dr Tami Lin and he suggested she stop the Prozac, she did so but unfortunately it has not helped her diarrhea

## 2015-10-28 NOTE — Assessment & Plan Note (Addendum)
Is following with Dr [name] at oncology center at [facility], has decided to see her oncologist at [facility] again just for reassurance. She has not recently been following with them
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
## 2015-10-28 NOTE — Patient Instructions (Addendum)
Take Wellbutrin in morning ans take Prozac 10 mg during lunch and 1/2 tablet of the Remer Major Depressive Disorder Major depressive disorder is a mental illness. It also may be called clinical depression or unipolar depression. Major depressive disorder usually causes feelings of sadness, hopelessness, or helplessness. Some people with this disorder do not feel particularly sad but lose interest in doing things they used to enjoy (anhedonia). Major depressive disorder also can cause physical symptoms. It can interfere with work, school, relationships, and other normal everyday activities. The disorder varies in severity but is longer lasting and more serious than the sadness we all feel from time to time in our lives. Major depressive disorder often is triggered by stressful life events or major life changes. Examples of these triggers include divorce, loss of your job or home, a move, and the death of a family member or close friend. Sometimes this disorder occurs for no obvious reason at all. People who have family members with major depressive disorder or bipolar disorder are at higher risk for developing this disorder, with or without life stressors. Major depressive disorder can occur at any age. It may occur just once in your life (single episode major depressive disorder). It may occur multiple times (recurrent major depressive disorder). SYMPTOMS People with major depressive disorder have either anhedonia or depressed mood on nearly a daily basis for at least 2 weeks or longer. Symptoms of depressed mood include:  Feelings of sadness (blue or down in the dumps) or emptiness.  Feelings of hopelessness or helplessness.  Tearfulness or episodes of crying (may be observed by others).  Irritability (children and adolescents). In addition to depressed mood or anhedonia or both, people with this disorder have at least four of the following symptoms:  Difficulty sleeping or sleeping too much.    Significant change (increase or decrease) in appetite or weight.   Lack of energy or motivation.  Feelings of guilt and worthlessness.   Difficulty concentrating, remembering, or making decisions.  Unusually slow movement (psychomotor retardation) or restlessness (as observed by others).   Recurrent wishes for death, recurrent thoughts of self-harm (suicide), or a suicide attempt. People with major depressive disorder commonly have persistent negative thoughts about themselves, other people, and the world. People with severe major depressive disorder may experiencedistorted beliefs or perceptions about the world (psychotic delusions). They also may see or hear things that are not real (psychotic hallucinations). DIAGNOSIS Major depressive disorder is diagnosed through an assessment by your health care provider. Your health care provider will ask aboutaspects of your daily life, such as mood,sleep, and appetite, to see if you have the diagnostic symptoms of major depressive disorder. Your health care provider may ask about your medical history and use of alcohol or drugs, including prescription medicines. Your health care provider also may do a physical exam and blood work. This is because certain medical conditions and the use of certain substances can cause major depressive disorder-like symptoms (secondary depression). Your health care provider also may refer you to a mental health specialist for further evaluation and treatment. TREATMENT It is important to recognize the symptoms of major depressive disorder and seek treatment. The following treatments can be prescribed for this disorder:   Medicine. Antidepressant medicines usually are prescribed. Antidepressant medicines are thought to correct chemical imbalances in the brain that are commonly associated with major depressive disorder. Other types of medicine may be added if the symptoms do not respond to antidepressant medicines  alone or if psychotic delusions or  hallucinations occur.  Talk therapy. Talk therapy can be helpful in treating major depressive disorder by providing support, education, and guidance. Certain types of talk therapy also can help with negative thinking (cognitive behavioral therapy) and with relationship issues that trigger this disorder (interpersonal therapy). A mental health specialist can help determine which treatment is best for you. Most people with major depressive disorder do well with a combination of medicine and talk therapy. Treatments involving electrical stimulation of the brain can be used in situations with extremely severe symptoms or when medicine and talk therapy do not work over time. These treatments include electroconvulsive therapy, transcranial magnetic stimulation, and vagal nerve stimulation.   This information is not intended to replace advice given to you by your health care provider. Make sure you discuss any questions you have with your health care provider.   Document Released: 10/09/2012 Document Revised: 07/05/2014 Document Reviewed: 10/09/2012 Elsevier Interactive Patient Education [employer]. on at bedtime.

## 2015-10-29 ENCOUNTER — Encounter (HOSPITAL_COMMUNITY)
Admission: RE | Admit: 2015-10-29 | Discharge: 2015-10-29 | Disposition: A | Discharge status: Home / Self Care | Payer: Medicare Other | Source: Ambulatory Visit | Attending: Oncology | Admitting: Oncology

## 2015-10-29 DIAGNOSIS — C50911 Malignant neoplasm of unspecified site of right female breast: Secondary | ICD-10-CM | POA: Diagnosis not present

## 2015-10-29 DIAGNOSIS — C50811 Malignant neoplasm of overlapping sites of right female breast: Secondary | ICD-10-CM | POA: Insufficient documentation

## 2015-10-29 MED ORDER — TECHNETIUM TC 99M MEDRONATE IV KIT
24.1000 | PACK | Freq: Once | INTRAVENOUS | Status: AC | PRN
  Administered 2015-10-29: 24.1 via INTRAVENOUS

## 2015-11-09 ENCOUNTER — Other Ambulatory Visit: Payer: Self-pay | Admitting: Family Medicine

## 2015-11-20 ENCOUNTER — Ambulatory Visit: Payer: Self-pay | Admitting: Family Medicine

## 2015-11-20 ENCOUNTER — Telehealth: Payer: Self-pay | Admitting: Family Medicine

## 2015-11-20 NOTE — Telephone Encounter (Signed)
[facility] - Client [facility] Patient Name: [name] DOB: 09-28-1955 Initial Comment Caller states she is weak and dizzy. Also has sore throat, headache, blowing out green thick mucus. Nurse Assessment Nurse: Dimas Chyle, RN, Dellis Filbert Date/Time Eilene Ghazi Time): 11/20/2015 10:55:54 AM Confirm and document reason for call. If symptomatic, describe symptoms. You must click the next button to save text entered. ---Caller states she is weak and dizzy. Also has sore throat, headache, blowing out green thick mucus. Also running low grade fever. Has the patient traveled out of the country within the last 30 days? ---No Does the patient have any new or worsening symptoms? ---Yes Will a triage be completed? ---Yes Related visit to physician within the last 2 weeks? ---No Does the PT have any chronic conditions? (i.e. diabetes, asthma, etc.) ---Yes List chronic conditions. ---Hx of breast cancer Is this a behavioral health or substance abuse call? ---No Guidelines Guideline Title Affirmed Question Affirmed Notes Sinus Pain or Congestion [1] Sinus pain (not just congestion) AND [2] fever Final Disposition User See Physician within 24 Hours Dimas Chyle, RN, [employer] Referrals REFERRED TO PCP OFFICE Disagree/Comply: Leta Baptist

## 2015-11-20 NOTE — Telephone Encounter (Signed)
Can be reached :[phone]  Reason for call: pt called in stating she cannot keep appt today at 1:45pm with Dr. Lorelei Pont. States she is too weak and dizzy. Transferred to [facility] with [facility]. Put appt on hold not to double book in case pt is recommended to still come in. Charge or no charge if pt no show?

## 2015-11-20 NOTE — Telephone Encounter (Signed)
No charge. 

## 2015-11-21 ENCOUNTER — Encounter: Payer: Self-pay | Admitting: Family Medicine

## 2015-11-21 ENCOUNTER — Ambulatory Visit (INDEPENDENT_AMBULATORY_CARE_PROVIDER_SITE_OTHER): Payer: Medicare Other | Admitting: Family Medicine

## 2015-11-21 VITALS — BP 110/64 | HR 96 | Temp 98.2°F | Ht 65.0 in | Wt 172.2 lb

## 2015-11-21 DIAGNOSIS — J029 Acute pharyngitis, unspecified: Secondary | ICD-10-CM | POA: Diagnosis not present

## 2015-11-21 DIAGNOSIS — E039 Hypothyroidism, unspecified: Secondary | ICD-10-CM | POA: Diagnosis not present

## 2015-11-21 DIAGNOSIS — H6981 Other specified disorders of Eustachian tube, right ear: Secondary | ICD-10-CM

## 2015-11-21 LAB — COMPREHENSIVE METABOLIC PANEL
ALT: 17 U/L (ref 6–29)
AST: 24 U/L (ref 10–35)
Albumin: 3.9 g/dL (ref 3.6–5.1)
Alkaline Phosphatase: 93 U/L (ref 33–130)
BUN: 26 mg/dL — ABNORMAL HIGH (ref 7–25)
CO2: 24 mmol/L (ref 20–31)
Calcium: 8.7 mg/dL (ref 8.6–10.4)
Chloride: 107 mmol/L (ref 98–110)
Creat: 0.95 mg/dL (ref 0.50–1.05)
Glucose, Bld: 76 mg/dL (ref 65–99)
Potassium: 4.2 mmol/L (ref 3.5–5.3)
Sodium: 142 mmol/L (ref 135–146)
Total Bilirubin: 0.4 mg/dL (ref 0.2–1.2)
Total Protein: 6.1 g/dL (ref 6.1–8.1)

## 2015-11-21 LAB — CBC WITH DIFFERENTIAL/PLATELET
Basophils Absolute: 75 cells/uL (ref 0–200)
Basophils Relative: 1 %
Eosinophils Absolute: 75 cells/uL (ref 15–500)
Eosinophils Relative: 1 %
HCT: 40.9 % (ref 35.0–45.0)
Hemoglobin: 13.9 g/dL (ref 11.7–15.5)
Lymphocytes Relative: 28 %
Lymphs Abs: 2100 cells/uL (ref 850–3900)
MCH: 31.5 pg (ref 27.0–33.0)
MCHC: 34 g/dL (ref 32.0–36.0)
MCV: 92.7 fL (ref 80.0–100.0)
MPV: 9.7 fL (ref 7.5–12.5)
Monocytes Absolute: 675 cells/uL (ref 200–950)
Monocytes Relative: 9 %
Neutro Abs: 4575 cells/uL (ref 1500–7800)
Neutrophils Relative %: 61 %
Platelets: 282 10*3/uL (ref 140–400)
RBC: 4.41 MIL/uL (ref 3.80–5.10)
RDW: 13.6 % (ref 11.0–15.0)
WBC: 7.5 10*3/uL (ref 3.8–10.8)

## 2015-11-21 MED ORDER — CLINDAMYCIN HCL 300 MG PO CAPS: 300.0000 mg | ORAL_CAPSULE | Freq: Three times a day (TID) | ORAL | Status: DC

## 2015-11-21 MED ORDER — MECLIZINE HCL 25 MG PO TABS: 25.0000 mg | ORAL_TABLET | Freq: Three times a day (TID) | ORAL | Status: DC | PRN

## 2015-11-21 NOTE — Patient Instructions (Signed)

## 2015-11-21 NOTE — Progress Notes (Signed)
Pre visit review using our clinic review tool, if applicable. No additional management support is needed unless otherwise documented below in the visit note. 

## 2015-11-23 ENCOUNTER — Other Ambulatory Visit: Payer: Self-pay | Admitting: Family Medicine

## 2015-11-25 ENCOUNTER — Ambulatory Visit: Payer: Medicare Other | Admitting: Family Medicine

## 2015-11-30 DIAGNOSIS — J029 Acute pharyngitis, unspecified: Secondary | ICD-10-CM | POA: Insufficient documentation
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
## 2015-11-30 NOTE — Assessment & Plan Note (Signed)
Started on probiotics and antibiotics. Increase rest and hydration

## 2015-11-30 NOTE — Assessment & Plan Note (Signed)
Still struggling with pressure in b/l ears. Has been having allergic symptoms encouraged flonase and nasal saline, antihistamines bid.

## 2015-11-30 NOTE — Assessment & Plan Note (Signed)
On Levothyroxine, continue to monitor 

## 2015-12-01 ENCOUNTER — Other Ambulatory Visit: Payer: Self-pay | Admitting: Family Medicine

## 2015-12-04 ENCOUNTER — Telehealth: Payer: Self-pay | Admitting: *Deleted

## 2015-12-04 ENCOUNTER — Other Ambulatory Visit: Payer: Self-pay | Admitting: *Deleted

## 2015-12-04 DIAGNOSIS — C50811 Malignant neoplasm of overlapping sites of right female breast: Secondary | ICD-10-CM

## 2015-12-04 MED ORDER — UNABLE TO FIND: Status: DC

## 2015-12-04 NOTE — Telephone Encounter (Signed)
Patient requesting prescription for compression sleeve so that she can participate in the [employer] program. Prescription faxed to [facility] per patient's request.

## 2015-12-15 ENCOUNTER — Other Ambulatory Visit: Payer: Self-pay

## 2015-12-17 ENCOUNTER — Encounter: Payer: Self-pay | Admitting: Oncology

## 2015-12-17 ENCOUNTER — Telehealth: Payer: Self-pay | Admitting: Oncology

## 2015-12-17 ENCOUNTER — Ambulatory Visit (HOSPITAL_BASED_OUTPATIENT_CLINIC_OR_DEPARTMENT_OTHER): Payer: Medicare Other | Admitting: Oncology

## 2015-12-17 ENCOUNTER — Other Ambulatory Visit (HOSPITAL_BASED_OUTPATIENT_CLINIC_OR_DEPARTMENT_OTHER): Payer: Medicare Other

## 2015-12-17 VITALS — BP 121/65 | HR 90 | Temp 98.1°F | Resp 18 | Ht 65.0 in | Wt 174.6 lb

## 2015-12-17 DIAGNOSIS — C50811 Malignant neoplasm of overlapping sites of right female breast: Secondary | ICD-10-CM

## 2015-12-17 DIAGNOSIS — Z853 Personal history of malignant neoplasm of breast: Secondary | ICD-10-CM | POA: Diagnosis not present

## 2015-12-17 LAB — COMPREHENSIVE METABOLIC PANEL
ALT: 24 U/L (ref 0–55)
AST: 30 U/L (ref 5–34)
Albumin: 4 g/dL (ref 3.5–5.0)
Alkaline Phosphatase: 110 U/L (ref 40–150)
Anion Gap: 9 mEq/L (ref 3–11)
BUN: 17.1 mg/dL (ref 7.0–26.0)
CO2: 26 mEq/L (ref 22–29)
Calcium: 9.2 mg/dL (ref 8.4–10.4)
Chloride: 105 mEq/L (ref 98–109)
Creatinine: 1 mg/dL (ref 0.6–1.1)
EGFR: 63 mL/min/{1.73_m2} — ABNORMAL LOW (ref >90)
Glucose: 96 mg/dl (ref 70–140)
Potassium: 4.3 mEq/L (ref 3.5–5.1)
Sodium: 140 mEq/L (ref 136–145)
Total Bilirubin: 0.62 mg/dL (ref 0.20–1.20)
Total Protein: 7.3 g/dL (ref 6.4–8.3)

## 2015-12-17 LAB — CBC WITH DIFFERENTIAL/PLATELET
BASO%: 1.3 % (ref 0.0–2.0)
Basophils Absolute: 0.1 10*3/uL (ref 0.0–0.1)
EOS%: 3.5 % (ref 0.0–7.0)
Eosinophils Absolute: 0.2 10*3/uL (ref 0.0–0.5)
HCT: 45.5 % (ref 34.8–46.6)
HGB: 15 g/dL (ref 11.6–15.9)
LYMPH%: 33.6 % (ref 14.0–49.7)
MCH: 31.1 pg (ref 25.1–34.0)
MCHC: 32.9 g/dL (ref 31.5–36.0)
MCV: 94.6 fL (ref 79.5–101.0)
MONO#: 0.5 10*3/uL (ref 0.1–0.9)
MONO%: 8.5 % (ref 0.0–14.0)
NEUT#: 3 10*3/uL (ref 1.5–6.5)
NEUT%: 53.1 % (ref 38.4–76.8)
Platelets: 280 10*3/uL (ref 145–400)
RBC: 4.81 10*6/uL (ref 3.70–5.45)
RDW: 13.2 % (ref 11.2–14.5)
WBC: 5.6 10*3/uL (ref 3.9–10.3)
lymph#: 1.9 10*3/uL (ref 0.9–3.3)

## 2015-12-17 NOTE — Telephone Encounter (Signed)
appt made and avs printed °
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
## 2015-12-18 ENCOUNTER — Telehealth: Payer: Self-pay | Admitting: Oncology

## 2015-12-18 ENCOUNTER — Ambulatory Visit: Payer: Self-pay | Admitting: Family Medicine

## 2015-12-18 NOTE — Telephone Encounter (Signed)
Faxed records to Dr. Collene Mares

## 2015-12-19 ENCOUNTER — Other Ambulatory Visit (INDEPENDENT_AMBULATORY_CARE_PROVIDER_SITE_OTHER): Payer: Medicare Other

## 2015-12-19 DIAGNOSIS — F418 Other specified anxiety disorders: Secondary | ICD-10-CM

## 2015-12-19 DIAGNOSIS — M899 Disorder of bone, unspecified: Secondary | ICD-10-CM

## 2015-12-19 DIAGNOSIS — E039 Hypothyroidism, unspecified: Secondary | ICD-10-CM | POA: Diagnosis not present

## 2015-12-19 DIAGNOSIS — R197 Diarrhea, unspecified: Secondary | ICD-10-CM | POA: Diagnosis not present

## 2015-12-19 DIAGNOSIS — F419 Anxiety disorder, unspecified: Secondary | ICD-10-CM

## 2015-12-19 DIAGNOSIS — F329 Major depressive disorder, single episode, unspecified: Secondary | ICD-10-CM

## 2015-12-19 DIAGNOSIS — E785 Hyperlipidemia, unspecified: Secondary | ICD-10-CM | POA: Diagnosis not present

## 2015-12-19 DIAGNOSIS — Z853 Personal history of malignant neoplasm of breast: Secondary | ICD-10-CM | POA: Diagnosis not present

## 2015-12-19 DIAGNOSIS — K219 Gastro-esophageal reflux disease without esophagitis: Secondary | ICD-10-CM

## 2015-12-19 DIAGNOSIS — F32A Depression, unspecified: Secondary | ICD-10-CM

## 2015-12-19 LAB — LIPID PANEL
Cholesterol: 180 mg/dL (ref 0–200)
HDL: 52.4 mg/dL (ref >39.00)
LDL Cholesterol: 111 mg/dL — ABNORMAL HIGH (ref 0–99)
NonHDL: 127.78
Total CHOL/HDL Ratio: 3
Triglycerides: 84 mg/dL (ref 0.0–149.0)
VLDL: 16.8 mg/dL (ref 0.0–40.0)

## 2015-12-19 LAB — TSH: TSH: 1.51 u[IU]/mL (ref 0.35–4.50)

## 2015-12-22 DIAGNOSIS — R109 Unspecified abdominal pain: Secondary | ICD-10-CM | POA: Diagnosis not present

## 2015-12-22 DIAGNOSIS — N760 Acute vaginitis: Secondary | ICD-10-CM | POA: Diagnosis not present

## 2015-12-22 DIAGNOSIS — M1712 Unilateral primary osteoarthritis, left knee: Secondary | ICD-10-CM | POA: Diagnosis not present

## 2015-12-22 DIAGNOSIS — R1032 Left lower quadrant pain: Secondary | ICD-10-CM | POA: Diagnosis not present

## 2015-12-22 LAB — VITAMIN D 1,25 DIHYDROXY
Vitamin D 1, 25 (OH)2 Total: 53 pg/mL (ref 18–72)
Vitamin D2 1, 25 (OH)2: 40 pg/mL
Vitamin D3 1, 25 (OH)2: 13 pg/mL

## 2015-12-22 NOTE — Addendum Note (Signed)
Addended by: Caffie Pinto on: 12/22/2015 08:08 AM   Modules accepted: Orders

## 2015-12-26 ENCOUNTER — Ambulatory Visit: Payer: Self-pay | Admitting: Family Medicine

## 2015-12-29 ENCOUNTER — Ambulatory Visit (INDEPENDENT_AMBULATORY_CARE_PROVIDER_SITE_OTHER): Payer: Medicare Other | Admitting: Family Medicine

## 2015-12-29 ENCOUNTER — Encounter: Payer: Self-pay | Admitting: Family Medicine

## 2015-12-29 VITALS — BP 120/80 | HR 105 | Temp 98.6°F | Ht 65.0 in | Wt 175.2 lb

## 2015-12-29 DIAGNOSIS — R197 Diarrhea, unspecified: Secondary | ICD-10-CM

## 2015-12-29 DIAGNOSIS — R102 Pelvic and perineal pain unspecified side: Secondary | ICD-10-CM

## 2015-12-29 DIAGNOSIS — E039 Hypothyroidism, unspecified: Secondary | ICD-10-CM

## 2015-12-29 DIAGNOSIS — K219 Gastro-esophageal reflux disease without esophagitis: Secondary | ICD-10-CM

## 2015-12-29 DIAGNOSIS — F418 Other specified anxiety disorders: Secondary | ICD-10-CM

## 2015-12-29 DIAGNOSIS — R109 Unspecified abdominal pain: Secondary | ICD-10-CM

## 2015-12-29 MED ORDER — NYSTATIN 100000 UNIT/ML MT SUSP: 5.0000 mL | Freq: Four times a day (QID) | OROMUCOSAL | Status: DC

## 2015-12-29 NOTE — Patient Instructions (Addendum)
Lidocaine patches Aspercreme or Salon Pas  Heartburn Heartburn is a type of pain or discomfort that can happen in the throat or chest. It is often described as a burning pain. It may also cause a bad taste in the mouth. Heartburn may feel worse when you lie down or bend over, and it is often worse at night. Heartburn may be caused by stomach contents that move back up into the esophagus (reflux). HOME CARE INSTRUCTIONS Take these actions to decrease your discomfort and to help avoid complications. Diet  Follow a diet as recommended by your health care provider. This may involve avoiding foods and drinks such as:  Coffee and tea (with or without caffeine).  Drinks that contain alcohol.  Energy drinks and sports drinks.  Carbonated drinks or sodas.  Chocolate and cocoa.  Peppermint and mint flavorings.  Garlic and onions.  Horseradish.  Spicy and acidic foods, including peppers, chili powder, curry powder, vinegar, hot sauces, and barbecue sauce.  Citrus fruit juices and citrus fruits, such as oranges, lemons, and limes.  Tomato-based foods, such as red sauce, chili, salsa, and pizza with red sauce.  Fried and fatty foods, such as donuts, french fries, potato chips, and high-fat dressings.  High-fat meats, such as hot dogs and fatty cuts of red and white meats, such as rib eye steak, sausage, ham, and bacon.  High-fat dairy items, such as whole milk, butter, and cream cheese.  Eat small, frequent meals instead of large meals.  Avoid drinking large amounts of liquid with your meals.  Avoid eating meals during the 2-3 hours before bedtime.  Avoid lying down right after you eat.  Do not exercise right after you eat. General Instructions  Pay attention to any changes in your symptoms.  Take over-the-counter and prescription medicines only as told by your health care provider. Do not take aspirin, ibuprofen, or other NSAIDs unless your health care provider told you to do  so.  Do not use any tobacco products, including cigarettes, chewing tobacco, and e-cigarettes. If you need help quitting, ask your health care provider.  Wear loose-fitting clothing. Do not wear anything tight around your waist that causes pressure on your abdomen.  Raise (elevate) the head of your bed about 6 inches (15 cm).  Try to reduce your stress, such as with yoga or meditation. If you need help reducing stress, ask your health care provider.  If you are overweight, reduce your weight to an amount that is healthy for you. Ask your health care provider for guidance about a safe weight loss goal.  Keep all follow-up visits as told by your health care provider. This is important. SEEK MEDICAL CARE IF:  You have new symptoms.  You have unexplained weight loss.  You have difficulty swallowing, or it hurts to swallow.  You have wheezing or a persistent cough.  Your symptoms do not improve with treatment.  You have frequent heartburn for more than two weeks. SEEK IMMEDIATE MEDICAL CARE IF:  You have pain in your arms, neck, jaw, teeth, or back.  You feel sweaty, dizzy, or light-headed.  You have chest pain or shortness of breath.  You vomit and your vomit looks like blood or coffee grounds.  Your stool is bloody or black.   This information is not intended to replace advice given to you by your health care provider. Make sure you discuss any questions you have with your health care provider.   Document Released: 10/31/2008 Document Revised: 03/05/2015 Document Reviewed: 10/09/2014  Chartered certified accountant Patient Education [employer].

## 2016-01-04 NOTE — Assessment & Plan Note (Signed)
To date testing has been unremarkable, encouraged bid fiber supplement and multistrain probiotics.
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
## 2016-01-04 NOTE — Assessment & Plan Note (Signed)
On Levothyroxine, continue to monitor 
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
## 2016-01-04 NOTE — Assessment & Plan Note (Signed)
Has an appt with [facility] on 01/07/2016.

## 2016-01-04 NOTE — Assessment & Plan Note (Signed)
Persistent reflux symptoms with frequent abdominal pain and diarrhea. Patient is requesting GI referral and it is placed.

## 2016-01-05 ENCOUNTER — Telehealth: Payer: Self-pay | Admitting: Family Medicine

## 2016-01-05 MED ORDER — FLUCONAZOLE 150 MG PO TABS: ORAL_TABLET | ORAL | Status: DC

## 2016-01-05 NOTE — Telephone Encounter (Signed)
Pt called in because she says that she was seen for a yeast infection in her mouth. Pt says that she believe that the infection has gone to her ear. Pt would like to know if provider could call her something else in to pharmacy.    Please advise if an appt is needed.      Pharmacy: [id number], [place].

## 2016-01-05 NOTE — Telephone Encounter (Signed)
Patient informed. 

## 2016-01-05 NOTE — Telephone Encounter (Signed)
Diflucan 150 mg tab 1 tab po q week x 2 weeks

## 2016-01-07 DIAGNOSIS — R140 Abdominal distension (gaseous): Secondary | ICD-10-CM | POA: Diagnosis not present

## 2016-01-13 ENCOUNTER — Other Ambulatory Visit: Payer: Self-pay | Admitting: Family Medicine

## 2016-01-13 NOTE — Telephone Encounter (Signed)
Requesting:  Alprazolam Contract   None UDS   None Last OV   12/29/2015 Last Refill   #90 with 1 refill on 10/20/2015  Please Advise

## 2016-01-13 NOTE — Telephone Encounter (Signed)
Faxed hardcopy to [place]

## 2016-01-18 ENCOUNTER — Other Ambulatory Visit: Payer: Self-pay | Admitting: Family Medicine

## 2016-01-18 DIAGNOSIS — F419 Anxiety disorder, unspecified: Principal | ICD-10-CM

## 2016-01-18 DIAGNOSIS — F329 Major depressive disorder, single episode, unspecified: Secondary | ICD-10-CM

## 2016-01-18 DIAGNOSIS — F32A Depression, unspecified: Secondary | ICD-10-CM

## 2016-01-21 ENCOUNTER — Ambulatory Visit: Payer: Self-pay | Admitting: Family

## 2016-01-26 ENCOUNTER — Ambulatory Visit: Payer: Self-pay | Admitting: Family

## 2016-02-04 DIAGNOSIS — M1712 Unilateral primary osteoarthritis, left knee: Secondary | ICD-10-CM | POA: Diagnosis not present

## 2016-02-11 ENCOUNTER — Encounter (HOSPITAL_BASED_OUTPATIENT_CLINIC_OR_DEPARTMENT_OTHER): Payer: Self-pay | Admitting: *Deleted

## 2016-02-11 ENCOUNTER — Emergency Department (HOSPITAL_BASED_OUTPATIENT_CLINIC_OR_DEPARTMENT_OTHER)
Admission: EM | Admit: 2016-02-11 | Discharge: 2016-02-11 | Disposition: A | Discharge status: Home / Self Care | Payer: Medicare Other | Attending: Emergency Medicine | Admitting: Emergency Medicine

## 2016-02-11 ENCOUNTER — Emergency Department (HOSPITAL_BASED_OUTPATIENT_CLINIC_OR_DEPARTMENT_OTHER): Payer: Medicare Other

## 2016-02-11 DIAGNOSIS — E039 Hypothyroidism, unspecified: Secondary | ICD-10-CM | POA: Diagnosis not present

## 2016-02-11 DIAGNOSIS — R0602 Shortness of breath: Secondary | ICD-10-CM | POA: Diagnosis not present

## 2016-02-11 DIAGNOSIS — Z853 Personal history of malignant neoplasm of breast: Secondary | ICD-10-CM | POA: Insufficient documentation

## 2016-02-11 DIAGNOSIS — E876 Hypokalemia: Secondary | ICD-10-CM | POA: Diagnosis not present

## 2016-02-11 DIAGNOSIS — R110 Nausea: Secondary | ICD-10-CM | POA: Insufficient documentation

## 2016-02-11 DIAGNOSIS — M1712 Unilateral primary osteoarthritis, left knee: Secondary | ICD-10-CM | POA: Diagnosis not present

## 2016-02-11 DIAGNOSIS — I2510 Atherosclerotic heart disease of native coronary artery without angina pectoris: Secondary | ICD-10-CM | POA: Insufficient documentation

## 2016-02-11 DIAGNOSIS — Z79899 Other long term (current) drug therapy: Secondary | ICD-10-CM | POA: Insufficient documentation

## 2016-02-11 DIAGNOSIS — E860 Dehydration: Secondary | ICD-10-CM | POA: Diagnosis not present

## 2016-02-11 DIAGNOSIS — R51 Headache: Secondary | ICD-10-CM | POA: Diagnosis present

## 2016-02-11 LAB — BASIC METABOLIC PANEL
Anion gap: 10 (ref 5–15)
BUN: 24 mg/dL — ABNORMAL HIGH (ref 6–20)
CO2: 29 mmol/L (ref 22–32)
Calcium: 8.6 mg/dL — ABNORMAL LOW (ref 8.9–10.3)
Chloride: 98 mmol/L — ABNORMAL LOW (ref 101–111)
Creatinine, Ser: 1.08 mg/dL — ABNORMAL HIGH (ref 0.44–1.00)
GFR calc Af Amer: >60 mL/min (ref >60)
GFR calc non Af Amer: 55 mL/min — ABNORMAL LOW (ref >60)
Glucose, Bld: 95 mg/dL (ref 65–99)
Potassium: 3.1 mmol/L — ABNORMAL LOW (ref 3.5–5.1)
Sodium: 137 mmol/L (ref 135–145)

## 2016-02-11 LAB — CBC WITH DIFFERENTIAL/PLATELET
Basophils Absolute: 0.1 10*3/uL (ref 0.0–0.1)
Basophils Relative: 1 %
Eosinophils Absolute: 0.1 10*3/uL (ref 0.0–0.7)
Eosinophils Relative: 2 %
HCT: 41.9 % (ref 36.0–46.0)
Hemoglobin: 14.3 g/dL (ref 12.0–15.0)
Lymphocytes Relative: 33 %
Lymphs Abs: 2.6 10*3/uL (ref 0.7–4.0)
MCH: 31.8 pg (ref 26.0–34.0)
MCHC: 34.1 g/dL (ref 30.0–36.0)
MCV: 93.1 fL (ref 78.0–100.0)
Monocytes Absolute: 0.8 10*3/uL (ref 0.1–1.0)
Monocytes Relative: 10 %
Neutro Abs: 4.3 10*3/uL (ref 1.7–7.7)
Neutrophils Relative %: 54 %
Platelets: 253 10*3/uL (ref 150–400)
RBC: 4.5 MIL/uL (ref 3.87–5.11)
RDW: 13 % (ref 11.5–15.5)
WBC: 7.8 10*3/uL (ref 4.0–10.5)

## 2016-02-11 LAB — URINALYSIS, ROUTINE W REFLEX MICROSCOPIC
Bilirubin Urine: NEGATIVE
Glucose, UA: NEGATIVE mg/dL
Ketones, ur: NEGATIVE mg/dL
Nitrite: NEGATIVE
Protein, ur: NEGATIVE mg/dL
Specific Gravity, Urine: 1.006 (ref 1.005–1.030)
pH: 5 (ref 5.0–8.0)

## 2016-02-11 LAB — URINE MICROSCOPIC-ADD ON

## 2016-02-11 LAB — TROPONIN I: Troponin I: <0.03 ng/mL (ref <0.03)

## 2016-02-11 MED ORDER — SODIUM CHLORIDE 0.9 % IV BOLUS (SEPSIS)
1000.0000 mL | Freq: Once | INTRAVENOUS | Status: AC
  Administered 2016-02-11: 1000 mL via INTRAVENOUS

## 2016-02-11 MED ORDER — POTASSIUM CHLORIDE CRYS ER 20 MEQ PO TBCR
40.0000 meq | EXTENDED_RELEASE_TABLET | Freq: Once | ORAL | Status: AC
  Administered 2016-02-11: 40 meq via ORAL
  Filled 2016-02-11: qty 2

## 2016-02-11 NOTE — Discharge Instructions (Signed)
You have been seen today for symptoms of dehydration. Your imaging showed no abnormalities. Your lab results showed evidence of dehydration. Your potassium was lower than normal and this should be rechecked within a week by your PCP. Return to ED should symptoms worsen.  Recommend increasing your fluid intake to avoid further dehydration.
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
## 2016-02-11 NOTE — ED Triage Notes (Signed)
Pt c/o " dehydration" and over heated   x 5 days

## 2016-02-12 ENCOUNTER — Telehealth: Payer: Self-pay | Admitting: Family Medicine
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
## 2016-02-16 ENCOUNTER — Encounter: Payer: Self-pay | Admitting: Medical

## 2016-02-16 ENCOUNTER — Ambulatory Visit (INDEPENDENT_AMBULATORY_CARE_PROVIDER_SITE_OTHER): Payer: Medicare Other | Admitting: Medical

## 2016-02-16 VITALS — BP 124/58 | HR 103 | Temp 97.9°F | Ht 65.0 in | Wt 179.2 lb

## 2016-02-16 DIAGNOSIS — R5383 Other fatigue: Secondary | ICD-10-CM | POA: Diagnosis not present

## 2016-02-16 DIAGNOSIS — E876 Hypokalemia: Secondary | ICD-10-CM

## 2016-02-16 DIAGNOSIS — N390 Urinary tract infection, site not specified: Secondary | ICD-10-CM | POA: Diagnosis not present

## 2016-02-16 DIAGNOSIS — E860 Dehydration: Secondary | ICD-10-CM

## 2016-02-16 DIAGNOSIS — R82998 Other abnormal findings in urine: Secondary | ICD-10-CM

## 2016-02-16 LAB — COMPREHENSIVE METABOLIC PANEL
ALT: 22 U/L (ref 0–35)
AST: 27 U/L (ref 0–37)
Albumin: 4.1 g/dL (ref 3.5–5.2)
Alkaline Phosphatase: 96 U/L (ref 39–117)
BUN: 19 mg/dL (ref 6–23)
CO2: 28 mEq/L (ref 19–32)
Calcium: 9 mg/dL (ref 8.4–10.5)
Chloride: 103 mEq/L (ref 96–112)
Creatinine, Ser: 0.96 mg/dL (ref 0.40–1.20)
GFR: 63.25 mL/min (ref >60.00)
Glucose, Bld: 102 mg/dL — ABNORMAL HIGH (ref 70–99)
Potassium: 4.8 mEq/L (ref 3.5–5.1)
Sodium: 138 mEq/L (ref 135–145)
Total Bilirubin: 0.6 mg/dL (ref 0.2–1.2)
Total Protein: 7 g/dL (ref 6.0–8.3)

## 2016-02-16 LAB — CBC WITH DIFFERENTIAL/PLATELET
Basophils Absolute: 0 10*3/uL (ref 0.0–0.1)
Basophils Relative: 0.7 % (ref 0.0–3.0)
Eosinophils Absolute: 0.1 10*3/uL (ref 0.0–0.7)
Eosinophils Relative: 1.3 % (ref 0.0–5.0)
HCT: 43.9 % (ref 36.0–46.0)
Hemoglobin: 14.9 g/dL (ref 12.0–15.0)
Lymphocytes Relative: 33.1 % (ref 12.0–46.0)
Lymphs Abs: 2.2 10*3/uL (ref 0.7–4.0)
MCHC: 33.9 g/dL (ref 30.0–36.0)
MCV: 92.5 fl (ref 78.0–100.0)
Monocytes Absolute: 0.6 10*3/uL (ref 0.1–1.0)
Monocytes Relative: 8.3 % (ref 3.0–12.0)
Neutro Abs: 3.8 10*3/uL (ref 1.4–7.7)
Neutrophils Relative %: 56.6 % (ref 43.0–77.0)
Platelets: 281 10*3/uL (ref 150.0–400.0)
RBC: 4.75 Mil/uL (ref 3.87–5.11)
RDW: 13.9 % (ref 11.5–15.5)
WBC: 6.7 10*3/uL (ref 4.0–10.5)

## 2016-02-16 NOTE — Telephone Encounter (Signed)
Pt cmp and cbc look good. Her k is now within normal limits.

## 2016-02-16 NOTE — Patient Instructions (Addendum)
For your recent dehydration and low K will get cmp today. Since you note some fatigue will include cbc today.  For your pedal edema continue intermittent lasix as needed. But you could also occasionally take 1/2 tab rather than whole tab. Would advise eating k rich foods.   If any rapid weight gain with sob then would get work up that would include cxr and bnp.  For some bacteria in urine in ED will repeat that today and get culture if needed  Follow up date to be determined after lab review.
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
## 2016-02-16 NOTE — Progress Notes (Signed)
Pre visit review using our clinic tool,if applicable. No additional management support is needed unless otherwise documented below in the visit note.  

## 2016-02-17 LAB — URINE CULTURE: Organism ID, Bacteria: 10000

## 2016-02-17 NOTE — Telephone Encounter (Signed)
Left message patient to return call.

## 2016-02-17 NOTE — Telephone Encounter (Signed)
Patient called back regarding labs. Advised of results.

## 2016-02-18 DIAGNOSIS — M1712 Unilateral primary osteoarthritis, left knee: Secondary | ICD-10-CM | POA: Diagnosis not present

## 2016-02-21 ENCOUNTER — Telehealth: Payer: Self-pay | Admitting: Medical

## 2016-02-22 NOTE — Telephone Encounter (Signed)
Opened to review 

## 2016-02-24 ENCOUNTER — Other Ambulatory Visit (HOSPITAL_COMMUNITY)
Admission: RE | Admit: 2016-02-24 | Discharge: 2016-02-24 | Disposition: A | Discharge status: Home / Self Care | Payer: Medicare Other | Source: Ambulatory Visit | Attending: Medical | Admitting: Medical

## 2016-02-24 ENCOUNTER — Telehealth: Payer: Self-pay | Admitting: Medical

## 2016-02-24 ENCOUNTER — Other Ambulatory Visit: Payer: Medicare Other

## 2016-02-24 DIAGNOSIS — N760 Acute vaginitis: Secondary | ICD-10-CM | POA: Diagnosis not present

## 2016-02-24 DIAGNOSIS — N898 Other specified noninflammatory disorders of vagina: Secondary | ICD-10-CM

## 2016-02-24 DIAGNOSIS — Z113 Encounter for screening for infections with a predominantly sexual mode of transmission: Secondary | ICD-10-CM | POA: Diagnosis not present

## 2016-02-24 NOTE — Telephone Encounter (Signed)
Thanks for letting me know. Let me know if I need to do anything else

## 2016-02-24 NOTE — Telephone Encounter (Signed)
These bacteria are described as normal types found external and internal genitalia. I would not treat unless symptomatic. We could do ancillary studies and include Bacterial vaginosis. She could come by and do those tests. Since her symptom not classic for uti.

## 2016-02-24 NOTE — Telephone Encounter (Signed)
Patient did come in urine was collected, the patient would like for you to call her as she had so many questions about the test being done and why. She she really nervous when she left.

## 2016-02-24 NOTE — Telephone Encounter (Signed)
Will you help remind me to call pt and see how she is tomorrow am. I did urine ancillary studies on her. Done for bv mainly. Included other test. She got nervous about other test. So I want to check up on her tomorrow. Just remind me please and I will make call.

## 2016-02-24 NOTE — Telephone Encounter (Signed)
I decided against antibiotic. Pt wants to do ancillary studies first. Then she is willing to see gyn if these area negative. I put order as future. If sh comes in today and have to reorder. Please make sure same testing is done.

## 2016-02-24 NOTE — Telephone Encounter (Signed)
I talked with pt today after I saw message from lab. Called about 25 minutes after she got nervous regarding the urine ancillary studies I ordered. Before I left the office I explained would do bv test and rule out any other bacteria(did not specify what other bacteria). But doing test for bv.  Pt when she got to lab had discussion with tech about all test with the lab and they mentioned G and C. This worked pt up. She got very nervous and sounds almost teary.   When I called pt and talked with her she appears to have calmed down as I explained test was done to check for BV primarily only. Explained to go ahead and do all studies to prove negative in event we refer to gyn as she had mentioned. I explained avoid duplicate retesting since she gave sample today. Her complaint today on the phone was vaginal odor.  Wanted you to be aware as her pcp.   I am going to ask Santiago Glad to call pt tomorrow to see how she is.

## 2016-02-25 ENCOUNTER — Telehealth: Payer: Self-pay | Admitting: Medical

## 2016-02-25 NOTE — Telephone Encounter (Signed)
Called pt. Prior anxiety and nervousness has not returned. Doing well.

## 2016-02-26 ENCOUNTER — Other Ambulatory Visit: Payer: Self-pay | Admitting: Oncology

## 2016-02-26 DIAGNOSIS — Z1231 Encounter for screening mammogram for malignant neoplasm of breast: Secondary | ICD-10-CM

## 2016-02-26 DIAGNOSIS — Z853 Personal history of malignant neoplasm of breast: Secondary | ICD-10-CM

## 2016-02-26 LAB — URINE CYTOLOGY ANCILLARY ONLY
Chlamydia: NEGATIVE
Neisseria Gonorrhea: NEGATIVE
Trichomonas: NEGATIVE

## 2016-02-29 LAB — URINE CYTOLOGY ANCILLARY ONLY
Bacterial vaginitis: NEGATIVE
Candida vaginitis: NEGATIVE

## 2016-03-02 ENCOUNTER — Ambulatory Visit: Payer: Self-pay | Admitting: Family Medicine

## 2016-03-03 ENCOUNTER — Ambulatory Visit: Payer: Self-pay | Admitting: Medical

## 2016-03-05 ENCOUNTER — Telehealth: Payer: Self-pay | Admitting: Medical

## 2016-03-05 NOTE — Telephone Encounter (Signed)
I did not call pt. Looks like she was notified of results by staff.

## 2016-03-12 ENCOUNTER — Ambulatory Visit (HOSPITAL_BASED_OUTPATIENT_CLINIC_OR_DEPARTMENT_OTHER)
Admission: RE | Admit: 2016-03-12 | Discharge: 2016-03-12 | Disposition: A | Discharge status: Home / Self Care | Payer: Medicare Other | Source: Ambulatory Visit | Attending: Medical | Admitting: Medical

## 2016-03-12 ENCOUNTER — Ambulatory Visit (INDEPENDENT_AMBULATORY_CARE_PROVIDER_SITE_OTHER): Payer: Medicare Other | Admitting: Medical

## 2016-03-12 ENCOUNTER — Telehealth: Payer: Self-pay | Admitting: Medical

## 2016-03-12 ENCOUNTER — Encounter: Payer: Self-pay | Admitting: Medical

## 2016-03-12 VITALS — BP 131/68 | HR 96 | Temp 98.1°F | Ht 65.0 in | Wt 183.8 lb

## 2016-03-12 DIAGNOSIS — M25521 Pain in right elbow: Secondary | ICD-10-CM

## 2016-03-12 DIAGNOSIS — R05 Cough: Secondary | ICD-10-CM

## 2016-03-12 DIAGNOSIS — H938X1 Other specified disorders of right ear: Secondary | ICD-10-CM | POA: Diagnosis not present

## 2016-03-12 DIAGNOSIS — R062 Wheezing: Secondary | ICD-10-CM

## 2016-03-12 DIAGNOSIS — S59901A Unspecified injury of right elbow, initial encounter: Secondary | ICD-10-CM | POA: Diagnosis not present

## 2016-03-12 DIAGNOSIS — J309 Allergic rhinitis, unspecified: Secondary | ICD-10-CM | POA: Diagnosis not present

## 2016-03-12 DIAGNOSIS — J0100 Acute maxillary sinusitis, unspecified: Secondary | ICD-10-CM | POA: Diagnosis not present

## 2016-03-12 DIAGNOSIS — R059 Cough, unspecified: Secondary | ICD-10-CM

## 2016-03-12 MED ORDER — FLUTICASONE PROPIONATE 50 MCG/ACT NA SUSP: 2.0000 | Freq: Every day | NASAL | 1 refills | Status: DC

## 2016-03-12 MED ORDER — DOXYCYCLINE HYCLATE 100 MG PO TABS: 100.0000 mg | ORAL_TABLET | Freq: Two times a day (BID) | ORAL | 0 refills | Status: DC

## 2016-03-12 MED ORDER — FLUTICASONE PROPIONATE HFA 110 MCG/ACT IN AERO: 2.0000 | INHALATION_SPRAY | Freq: Two times a day (BID) | RESPIRATORY_TRACT | 3 refills | Status: DC
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
## 2016-03-12 NOTE — Telephone Encounter (Signed)
Tried to call pt on Friday night to go over xray results. No answer. So reviewed and put in result note.

## 2016-03-12 NOTE — Patient Instructions (Addendum)
You may have some allergies recently with subsequent pressure in eustachian tube. Ear drum/TM not appear infected presently.(nasal spray may relieve pressure)  For possible early sinus infection rx doxycycline antibiotic. You want to see how you respond to nasal spray first. That is reasonable. But if your sinus pain persists or if chest congestion worsens start antibiotic.  For wheezing use your albuterol. If using every 6 hours then add flovent inhaler.  Please get cxr today.  Also for elbow pain will get xray. Since pain for 2 months will likely refer to sports med. May have tennis elbow.  Follow up in 7 days or as needed

## 2016-03-15 NOTE — Telephone Encounter (Signed)
done

## 2016-03-15 NOTE — Telephone Encounter (Signed)
Pt is going to see her orthopedist. So you can cancel my orhto referral I placed.

## 2016-03-17 ENCOUNTER — Other Ambulatory Visit: Payer: Self-pay | Admitting: Family Medicine

## 2016-03-17 DIAGNOSIS — F32A Depression, unspecified: Secondary | ICD-10-CM

## 2016-03-17 DIAGNOSIS — F329 Major depressive disorder, single episode, unspecified: Secondary | ICD-10-CM

## 2016-03-17 DIAGNOSIS — F419 Anxiety disorder, unspecified: Principal | ICD-10-CM

## 2016-03-19 DIAGNOSIS — M7711 Lateral epicondylitis, right elbow: Secondary | ICD-10-CM | POA: Diagnosis not present

## 2016-03-22 ENCOUNTER — Other Ambulatory Visit: Payer: Self-pay | Admitting: Family Medicine

## 2016-03-30 ENCOUNTER — Ambulatory Visit (INDEPENDENT_AMBULATORY_CARE_PROVIDER_SITE_OTHER): Payer: Medicare Other | Admitting: Family Medicine

## 2016-03-30 VITALS — BP 120/82 | HR 107 | Temp 98.0°F

## 2016-03-30 DIAGNOSIS — K219 Gastro-esophageal reflux disease without esophagitis: Secondary | ICD-10-CM

## 2016-03-30 DIAGNOSIS — G4700 Insomnia, unspecified: Secondary | ICD-10-CM

## 2016-03-30 DIAGNOSIS — H6981 Other specified disorders of Eustachian tube, right ear: Secondary | ICD-10-CM

## 2016-03-30 DIAGNOSIS — Z889 Allergy status to unspecified drugs, medicaments and biological substances status: Secondary | ICD-10-CM

## 2016-03-30 DIAGNOSIS — H6991 Unspecified Eustachian tube disorder, right ear: Secondary | ICD-10-CM

## 2016-03-30 MED ORDER — AMOXICILLIN-POT CLAVULANATE 875-125 MG PO TABS: 1.0000 | ORAL_TABLET | Freq: Two times a day (BID) | ORAL | 0 refills | Status: DC

## 2016-03-30 MED ORDER — HYDROCORTISONE-ACETIC ACID 1-2 % OT SOLN: 4.0000 [drp] | Freq: Three times a day (TID) | OTIC | 0 refills | Status: DC

## 2016-03-30 NOTE — Patient Instructions (Signed)
Wipe area around ear with witch hazel and then try applying Triamcinolone.  Otitis Externa Otitis externa is a bacterial or fungal infection of the outer ear canal. This is the area from the eardrum to the outside of the ear. Otitis externa is sometimes called "swimmer's ear." CAUSES  Possible causes of infection include:  Swimming in dirty water.  Moisture remaining in the ear after swimming or bathing.  Mild injury (trauma) to the ear.  Objects stuck in the ear (foreign body).  Cuts or scrapes (abrasions) on the outside of the ear. SIGNS AND SYMPTOMS  The first symptom of infection is often itching in the ear canal. Later signs and symptoms may include swelling and redness of the ear canal, ear pain, and yellowish-white fluid (pus) coming from the ear. The ear pain may be worse when pulling on the earlobe. DIAGNOSIS  Your health care provider will perform a physical exam. A sample of fluid may be taken from the ear and examined for bacteria or fungi. TREATMENT  Antibiotic ear drops are often given for 10 to 14 days. Treatment may also include pain medicine or corticosteroids to reduce itching and swelling. HOME CARE INSTRUCTIONS   Apply antibiotic ear drops to the ear canal as prescribed by your health care provider.  Take medicines only as directed by your health care provider.  If you have diabetes, follow any additional treatment instructions from your health care provider.  Keep all follow-up visits as directed by your health care provider. PREVENTION   Keep your ear dry. Use the corner of a towel to absorb water out of the ear canal after swimming or bathing.  Avoid scratching or putting objects inside your ear. This can damage the ear canal or remove the protective wax that lines the canal. This makes it easier for bacteria and fungi to grow.  Avoid swimming in lakes, polluted water, or poorly chlorinated pools.  You may use ear drops made of rubbing alcohol and vinegar  after swimming. Combine equal parts of white vinegar and alcohol in a bottle. Put 3 or 4 drops into each ear after swimming. SEEK MEDICAL CARE IF:   You have a fever.  Your ear is still red, swollen, painful, or draining pus after 3 days.  Your redness, swelling, or pain gets worse.  You have a severe headache.  You have redness, swelling, pain, or tenderness in the area behind your ear. MAKE SURE YOU:   Understand these instructions.  Will watch your condition.  Will get help right away if you are not doing well or get worse.   This information is not intended to replace advice given to you by your health care provider. Make sure you discuss any questions you have with your health care provider.   Document Released: 06/14/2005 Document Revised: 07/05/2014 Document Reviewed: 07/01/2011 Elsevier Interactive Patient Education [employer].

## 2016-03-30 NOTE — Progress Notes (Signed)
Pre visit review using our clinic review tool, if applicable. No additional management support is needed unless otherwise documented below in the visit note. 

## 2016-03-31 NOTE — Assessment & Plan Note (Signed)
Noting some hearing loss, no tinnitus. Referred to allergist for further consideration
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
## 2016-03-31 NOTE — Assessment & Plan Note (Signed)
Encouraged good sleep hygiene such as dark, quiet room. No blue/green glowing lights such as computer screens in bedroom. No alcohol or stimulants in evening. Cut down on caffeine as able. Regular exercise is helpful but not just prior to bed time.  

## 2016-03-31 NOTE — Assessment & Plan Note (Signed)
Avoid offending foods, start probiotics. Do not eat large meals in late evening and consider raising head of bed.  

## 2016-04-03 ENCOUNTER — Other Ambulatory Visit: Payer: Self-pay | Admitting: Family Medicine

## 2016-04-07 MED ORDER — ALPRAZOLAM 0.5 MG PO TABS: ORAL_TABLET | ORAL | 1 refills | Status: DC

## 2016-04-07 NOTE — Addendum Note (Signed)
Addended by: Rockwell Germany on: 04/07/2016 02:29 PM   Modules accepted: Orders

## 2016-04-07 NOTE — Telephone Encounter (Addendum)
Could not locate printed Rx [04/04/16 by Dr. Etter Sjogren; have reprinted Rx for new provider to sign in PCPs absence; will fax to pharmacy at [phone]; Patient informed, understood & agreed/SLS 10/11

## 2016-04-22 DIAGNOSIS — H9201 Otalgia, right ear: Secondary | ICD-10-CM | POA: Diagnosis not present

## 2016-04-22 DIAGNOSIS — M26621 Arthralgia of right temporomandibular joint: Secondary | ICD-10-CM | POA: Diagnosis not present

## 2016-04-22 DIAGNOSIS — L299 Pruritus, unspecified: Secondary | ICD-10-CM | POA: Diagnosis not present

## 2016-04-27 ENCOUNTER — Ambulatory Visit (INDEPENDENT_AMBULATORY_CARE_PROVIDER_SITE_OTHER): Payer: Medicare Other | Admitting: Emergency Medicine

## 2016-04-27 ENCOUNTER — Encounter: Payer: Self-pay | Admitting: Emergency Medicine

## 2016-04-27 DIAGNOSIS — J4520 Mild intermittent asthma, uncomplicated: Secondary | ICD-10-CM

## 2016-04-27 DIAGNOSIS — J301 Allergic rhinitis due to pollen: Secondary | ICD-10-CM | POA: Diagnosis not present

## 2016-04-27 DIAGNOSIS — Z23 Encounter for immunization: Secondary | ICD-10-CM | POA: Diagnosis not present

## 2016-04-27 DIAGNOSIS — J45909 Unspecified asthma, uncomplicated: Secondary | ICD-10-CM

## 2016-04-27 DIAGNOSIS — R938 Abnormal findings on diagnostic imaging of other specified body structures: Secondary | ICD-10-CM

## 2016-04-27 DIAGNOSIS — R9389 Abnormal findings on diagnostic imaging of other specified body structures: Secondary | ICD-10-CM | POA: Insufficient documentation

## 2016-04-27 HISTORY — DX: Unspecified asthma, uncomplicated: J45.909

## 2016-04-27 NOTE — Addendum Note (Signed)
Addended by: Desmond Dike C on: 04/27/2016 02:51 PM   Modules accepted: Orders

## 2016-04-27 NOTE — Assessment & Plan Note (Signed)
Suspect that this is a big part of her chronic cough. It appears to be poorly managed currently. She is on loratadine once a day. We will continue this. I like for her to start taking Flonase everyday on a schedule. She also has plans to see an allergist and have skin testing. She may need immunotherapy.

## 2016-04-27 NOTE — Progress Notes (Signed)
Subjective:    Patient ID: [name], female    DOB: Apr 01, 1956, 60 y.o.   MRN: [id number]  HPI 60 yo woman, never smoker, hx of breast CA (90's) with auto-stem tx, non-ischemic CM from chemo, CAD, allergies, IBS, HH. She was noted to have pulm toxicity from her chemo, followed by CT scan and PFT.   She returns today reporting that she has been feeling worse since March '15. She had to push herself very hard to take care of dependent family up until that time. Then subsequently she had some increase in dyspnea, had a few URI's. She underwent L knee sgy 02/23/14, has felt worse, more dyspneic since that procedure. She has been evaluated with TTE 11/2 that showed normal LV, RV fxn and normal PASP. Same day she had a CT chest that showed no PE, some very subtle diffuse GGI that was thought to be mild edema.  Then on 11/5 she had a reassuring perfusion scan   PULMONARY FUNCTON TEST 05/31/2008 06/24/2008 01/08/2011  FVC 3.12 3.27 2.93  FEV1 2.46 2.54 2.22  FEV1/FVC 78.8 77.7 75.8  FVC % Predicted 91 93 84  FEV % Predicted 95 95 85  FeF 25-75 2.27 2.29 1.86  FeF 25-75 % Predicted 2.94 2.98 2.92   FEV1 2.30L at [facility] 02/07/13  Myocardial perfusion Imaging 05/01/14 > negative  Acute OV 06/04/14 Complains of  dry cough, increased SOB, some wheezing, low grade temp w/ chills/sweats, sinus pressure/congestion w/ yellow/green drainage, some PND, bilateral ear congestion for 2 weeks , worse for last 3 days .  Has chills and sore throat.  Taking tylenol  Had flu shot.  Was seen last week for pulmonary consult for dyspnea. Has upcoming PFT next month  She is a never smoker  Last abx has been months ago.  Cough is keeping her up at night.   ROV 08/26/14 -- follow-up visit for exertional dyspnea, possibly related to some degree of interstitial disease from prior chemotherapy for breast cancer. She has CT scan of her chest in 04/2014 that suggested a possible progression  of pneumonitis compared with 09/2013. Also noted was some mild interstitial atelectasis in the right middle lobe as well as some emphysematous change. She underwent repeat peripheral function testing on 08/26/14 that show an improvement in her FVC and FEV1 compared with July 2012. She is feeling better, wonders if some of her sx were due to a drop off in her exercise routine. Her spirometry and flow - volume curve suggest some very mild obstruction.   ROV 04/27/16 -- patient has a history of interstitial disease and dyspnea that has been ascribed to pneumonitis due to chemotherapy for breast cancer. She also has some very mild obstruction on pulmonary function testing. We have been following her with serial CT scans and spirometry. Last seen February 2016.  For about the last year she has been dealing with sweats, sore throat, sinus drainage off and on. She has been treated for PNA at least once, for bronchitis on a few occasions. May have gotten some short term relief. She has been dealing with allergic rhinitis, chronic dry cough. She describes this as congestion, drainage. She is interested in getting allergy testing. On loratadine daily, flonase prn. She is no longer on flovent, has albuterol available but has not used.      Objective:   Physical Exam Vitals:   04/27/16 1417  BP: 122/76  Pulse: 78  SpO2: 95%  Weight: 186 lb (84.4 kg)  Height: 5' 5.5" (1.664 m)    Gen: Pleasant,well-nourished, in no distress,  normal affect  ENT: No lesions,  mouth clear,  oropharynx clear, no postnasal drip  Neck: No JVD, no TMG, no carotid bruits  Lungs: No use of accessory muscles, clear without rales or rhonchi  Cardiovascular: RRR, heart sounds normal, no murmur or gallops, no peripheral edema  Musculoskeletal: No deformities, no cyanosis or clubbing  Neuro: alert, non focal  Skin: Warm, no lesions or rashes    04/29/14 --  COMPARISON: Portable chest obtained earlier today. The chest  CTA dated 10/15/2013. FINDINGS: Normally opacified pulmonary arteries with no pulmonary arterial filling defects. Interval linear density in the medial aspect of the right middle lobe. Interval mild increase in prominence of the interstitial markings with resultant visualization of some bullous changes in the left lower lobe. No lung nodules or enlarged lymph nodes. Unremarkable upper abdomen. Mild thoracic spine degenerative changes. Review of the MIP images confirms the above findings.  IMPRESSION: 1. No pulmonary emboli. 2. Interval mild interstitial pulmonary edema. Interstitial pneumonitis is less likely. 3. Interval linear atelectasis in the right middle lobe. 4. Mild changes of COPD      Assessment & Plan:  Allergic rhinitis Suspect that this is a big part of her chronic cough. It appears to be poorly managed currently. She is on loratadine once a day. We will continue this. I like for her to start taking Flonase everyday on a schedule. She also has plans to see an allergist and have skin testing. She may need immunotherapy.  Asthma Asthma based on her most recent spirometry. Her cough may be related to this but suspect it's mainly related to allergic rhinitis. She has not been on Flovent. She does not use albuterol any frequency. I'll keep her off maintenance medications for now and probably repeat her spirometry depending on how she does after her allergies have been adequately treated.  Abnormal CT of the chest History of pneumonitis following chemotherapy. She also has some right middle lobe atelectasis due to pectus abnormality. I would like to repeat her CT given her year-long symptoms of dry cough. Her chest x-ray has been stable. We will get a high-resolution scan.  Baltazar Apo, MD, PhD 04/27/2016, 2:48 PM [facility] [phone] or if no answer [phone]

## 2016-04-27 NOTE — Patient Instructions (Signed)
We will repeat a high resolution CT scan of the chest to compare with priors.  Please continue your loratadine daily Please restart flonase 2 sprays each nostril daily.  Do not restart Flovent.  Take albuterol 2 puffs up to every 4 hours if needed for shortness of breath.  Follow with Dr Lamonte Sakai next available to review your scan together.

## 2016-04-27 NOTE — Assessment & Plan Note (Signed)
History of pneumonitis following chemotherapy. She also has some right middle lobe atelectasis due to pectus abnormality. I would like to repeat her CT given her year-long symptoms of dry cough. Her chest x-ray has been stable. We will get a high-resolution scan.

## 2016-04-27 NOTE — Assessment & Plan Note (Signed)
Asthma based on her most recent spirometry. Her cough may be related to this but suspect it's mainly related to allergic rhinitis. She has not been on Flovent. She does not use albuterol any frequency. I'll keep her off maintenance medications for now and probably repeat her spirometry depending on how she does after her allergies have been adequately treated.

## 2016-05-04 DIAGNOSIS — H2513 Age-related nuclear cataract, bilateral: Secondary | ICD-10-CM | POA: Diagnosis not present

## 2016-05-04 DIAGNOSIS — H52203 Unspecified astigmatism, bilateral: Secondary | ICD-10-CM | POA: Diagnosis not present

## 2016-05-05 ENCOUNTER — Ambulatory Visit (INDEPENDENT_AMBULATORY_CARE_PROVIDER_SITE_OTHER)
Admission: RE | Admit: 2016-05-05 | Discharge: 2016-05-05 | Disposition: A | Discharge status: Home / Self Care | Payer: Medicare Other | Source: Ambulatory Visit | Attending: Emergency Medicine | Admitting: Emergency Medicine

## 2016-05-05 DIAGNOSIS — R938 Abnormal findings on diagnostic imaging of other specified body structures: Secondary | ICD-10-CM

## 2016-05-05 DIAGNOSIS — R918 Other nonspecific abnormal finding of lung field: Secondary | ICD-10-CM | POA: Diagnosis not present

## 2016-05-05 DIAGNOSIS — R9389 Abnormal findings on diagnostic imaging of other specified body structures: Secondary | ICD-10-CM

## 2016-05-07 ENCOUNTER — Telehealth: Payer: Self-pay | Admitting: Emergency Medicine

## 2016-05-07 ENCOUNTER — Encounter: Payer: Self-pay | Admitting: Oncology

## 2016-05-07 NOTE — Telephone Encounter (Signed)
Pt requesting CT results from 05-05-16.  RB please advise. Thanks.

## 2016-05-07 NOTE — Telephone Encounter (Signed)
Please ;let the patient know that there is no eviddence for scarring, PNA or pneumonitis on her CT scan. Thanks.

## 2016-05-10 NOTE — Telephone Encounter (Signed)
Called pt on her home phone and her cell. Left voice mail on both lines. Will try her again 11/14

## 2016-05-10 NOTE — Telephone Encounter (Signed)
Spoke with pt. She is wanting more detail from her CT. Pt is aware of RB's response but was not happy with it.  RB - please advise. Thanks.

## 2016-05-11 ENCOUNTER — Other Ambulatory Visit: Payer: Self-pay | Admitting: Family Medicine

## 2016-05-11 MED ORDER — LEVOTHYROXINE SODIUM 75 MCG PO TABS: 75.0000 ug | ORAL_TABLET | Freq: Every day | ORAL | 0 refills | Status: DC

## 2016-05-12 NOTE — Telephone Encounter (Signed)
Called pt again - bad connection. Will continue to try her

## 2016-05-12 NOTE — Telephone Encounter (Signed)
Was able to reach [name]. Reviewed Ct chest with her. No new orders needed.

## 2016-05-18 ENCOUNTER — Ambulatory Visit: Payer: Self-pay | Admitting: Family Medicine

## 2016-05-18 ENCOUNTER — Emergency Department (HOSPITAL_COMMUNITY): Payer: Medicare Other

## 2016-05-18 ENCOUNTER — Encounter (HOSPITAL_COMMUNITY): Payer: Self-pay | Admitting: Emergency Medicine

## 2016-05-18 ENCOUNTER — Telehealth: Payer: Self-pay | Admitting: Family Medicine

## 2016-05-18 ENCOUNTER — Emergency Department (HOSPITAL_COMMUNITY)
Admission: EM | Admit: 2016-05-18 | Discharge: 2016-05-18 | Disposition: A | Discharge status: Home / Self Care | Payer: Medicare Other | Attending: Emergency Medicine | Admitting: Emergency Medicine

## 2016-05-18 ENCOUNTER — Emergency Department (HOSPITAL_BASED_OUTPATIENT_CLINIC_OR_DEPARTMENT_OTHER): Admit: 2016-05-18 | Discharge: 2016-05-18 | Disposition: A | Discharge status: Home / Self Care | Payer: Medicare Other

## 2016-05-18 DIAGNOSIS — M79604 Pain in right leg: Secondary | ICD-10-CM | POA: Diagnosis not present

## 2016-05-18 DIAGNOSIS — M79661 Pain in right lower leg: Secondary | ICD-10-CM | POA: Diagnosis not present

## 2016-05-18 DIAGNOSIS — E039 Hypothyroidism, unspecified: Secondary | ICD-10-CM | POA: Diagnosis not present

## 2016-05-18 DIAGNOSIS — J45909 Unspecified asthma, uncomplicated: Secondary | ICD-10-CM | POA: Diagnosis not present

## 2016-05-18 DIAGNOSIS — I2510 Atherosclerotic heart disease of native coronary artery without angina pectoris: Secondary | ICD-10-CM | POA: Insufficient documentation

## 2016-05-18 DIAGNOSIS — M25561 Pain in right knee: Secondary | ICD-10-CM | POA: Insufficient documentation

## 2016-05-18 DIAGNOSIS — Z79899 Other long term (current) drug therapy: Secondary | ICD-10-CM | POA: Diagnosis not present

## 2016-05-18 DIAGNOSIS — Z853 Personal history of malignant neoplasm of breast: Secondary | ICD-10-CM | POA: Diagnosis not present

## 2016-05-18 DIAGNOSIS — R0602 Shortness of breath: Secondary | ICD-10-CM | POA: Diagnosis not present

## 2016-05-18 DIAGNOSIS — M25461 Effusion, right knee: Secondary | ICD-10-CM | POA: Diagnosis not present

## 2016-05-18 DIAGNOSIS — R079 Chest pain, unspecified: Secondary | ICD-10-CM | POA: Diagnosis not present

## 2016-05-18 LAB — CBC
HCT: 41 % (ref 36.0–46.0)
Hemoglobin: 13.7 g/dL (ref 12.0–15.0)
MCH: 30.9 pg (ref 26.0–34.0)
MCHC: 33.4 g/dL (ref 30.0–36.0)
MCV: 92.3 fL (ref 78.0–100.0)
Platelets: 265 10*3/uL (ref 150–400)
RBC: 4.44 MIL/uL (ref 3.87–5.11)
RDW: 13.2 % (ref 11.5–15.5)
WBC: 7.4 10*3/uL (ref 4.0–10.5)

## 2016-05-18 LAB — BASIC METABOLIC PANEL
Anion gap: 7 (ref 5–15)
BUN: 27 mg/dL — ABNORMAL HIGH (ref 6–20)
CO2: 27 mmol/L (ref 22–32)
Calcium: 8.7 mg/dL — ABNORMAL LOW (ref 8.9–10.3)
Chloride: 104 mmol/L (ref 101–111)
Creatinine, Ser: 0.97 mg/dL (ref 0.44–1.00)
GFR calc Af Amer: >60 mL/min (ref >60)
GFR calc non Af Amer: >60 mL/min (ref >60)
Glucose, Bld: 80 mg/dL (ref 65–99)
Potassium: 3.8 mmol/L (ref 3.5–5.1)
Sodium: 138 mmol/L (ref 135–145)

## 2016-05-18 LAB — D-DIMER, QUANTITATIVE: D-Dimer, Quant: 0.4 ug/mL-FEU (ref 0.00–0.50)

## 2016-05-18 LAB — BRAIN NATRIURETIC PEPTIDE: B Natriuretic Peptide: 16.4 pg/mL (ref 0.0–100.0)

## 2016-05-18 LAB — I-STAT TROPONIN, ED: Troponin i, poc: 0.01 ng/mL (ref 0.00–0.08)

## 2016-05-18 MED ORDER — SODIUM CHLORIDE 0.9 % IV BOLUS (SEPSIS): 1000.0000 mL | Freq: Once | INTRAVENOUS | Status: DC

## 2016-05-18 MED ORDER — SODIUM CHLORIDE 0.9 % IJ SOLN
INTRAMUSCULAR | Status: AC
  Filled 2016-05-18: qty 50

## 2016-05-18 MED ORDER — SODIUM CHLORIDE 0.9 % IV SOLN
Freq: Once | INTRAVENOUS | Status: AC
  Administered 2016-05-18: 17:00:00 via INTRAVENOUS

## 2016-05-18 MED ORDER — IOPAMIDOL (ISOVUE-370) INJECTION 76%
INTRAVENOUS | Status: AC
  Administered 2016-05-18: 100 mL
  Filled 2016-05-18: qty 100

## 2016-05-18 NOTE — ED Triage Notes (Signed)
Patient c/o right leg pain that has been going on for couple days.  Patient states went to a concert Saturday and had to stand for long time.  Patient pain is worse behind knee. Patient also started having SOB and left sided chest tightness that started today and even when no exertion.

## 2016-05-18 NOTE — Progress Notes (Signed)
*  PRELIMINARY RESULTS* Vascular Ultrasound Lower extremity venous duplex has been completed.  Preliminary findings:No evidence of deep vein thrombosis or baker's cysts in the right lower extremity.  Results given to PA at 16:50  Everrett Coombe 05/18/2016, 4:55 PM

## 2016-05-18 NOTE — Telephone Encounter (Signed)
Patient Name: [name] DOB: March 25, 1956 Initial Comment Caller states she has been having severe pain in right leg, especially behind knee today, possibly swollen, also short of breath, cancer history, Nurse Assessment Nurse: Ronnald Ramp, RN, Miranda Date/Time (Eastern Time): 05/18/2016 2:06:44 PM Confirm and document reason for call. If symptomatic, describe symptoms. You must click the next button to save text entered. ---Caller states she started having pain in her right leg a couple of days ago. Today there seemed to be swelling behind her right knee and the pain is increased. She has chronic SOB but it is worse today. Does the patient have any new or worsening symptoms? ---Yes Will a triage be completed? ---Yes Related visit to physician within the last 2 weeks? ---No Does the PT have any chronic conditions? (i.e. diabetes, asthma, etc.) ---Yes List chronic conditions. ---Hx of cancer, CHF, Thyroid, Depression, Back pain Is this a behavioral health or substance abuse call? ---No Guidelines Guideline Title Affirmed Question Affirmed Notes Leg Pain Difficulty breathing Final Disposition User Go to ED Now Ronnald Ramp, RN, Miranda Comments The office had previously scheduled an appt for the pt before transferring to the nurse. Told caller she did not need to wait for that appt and to proceed directly to the ED. Appt cancelled in EPIC Referrals [facility] - ED Disagree/Comply: Comply

## 2016-05-18 NOTE — Discharge Instructions (Signed)
You have been seen today for leg pain. Your imaging and lab tests showed no acute abnormalities. Follow up with orthopedics as needed. Return to ED should symptoms worsen.
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
## 2016-05-24 DIAGNOSIS — M1711 Unilateral primary osteoarthritis, right knee: Secondary | ICD-10-CM | POA: Diagnosis not present

## 2016-05-24 DIAGNOSIS — M170 Bilateral primary osteoarthritis of knee: Secondary | ICD-10-CM | POA: Diagnosis not present

## 2016-05-25 ENCOUNTER — Encounter: Payer: Self-pay | Admitting: Allergy and Immunology

## 2016-05-25 ENCOUNTER — Ambulatory Visit (INDEPENDENT_AMBULATORY_CARE_PROVIDER_SITE_OTHER): Payer: Medicare Other | Admitting: Allergy and Immunology

## 2016-05-25 ENCOUNTER — Other Ambulatory Visit: Payer: Self-pay | Admitting: Allergy and Immunology

## 2016-05-25 VITALS — BP 128/68 | HR 80 | Temp 98.4°F | Resp 20 | Ht 64.88 in | Wt 186.0 lb

## 2016-05-25 DIAGNOSIS — J3089 Other allergic rhinitis: Secondary | ICD-10-CM | POA: Diagnosis not present

## 2016-05-25 DIAGNOSIS — J4520 Mild intermittent asthma, uncomplicated: Secondary | ICD-10-CM

## 2016-05-25 DIAGNOSIS — J329 Chronic sinusitis, unspecified: Secondary | ICD-10-CM | POA: Diagnosis not present

## 2016-05-25 DIAGNOSIS — B999 Unspecified infectious disease: Secondary | ICD-10-CM | POA: Diagnosis not present
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
## 2016-05-25 NOTE — Patient Instructions (Addendum)
  1. Return to clinic for skin testing without antihistamine in 2 weeks  2. Continue to use Flonase 1-2 sprays each nostril one time per day  3. If needed:   A. loratadine 10 mg tablet 1 time per day  B. nasal saline spray  4. Obtain limited sinus CT scan  5. Blood - IgA/G/M, anti-pneumococcal antibody, antitetanus antibody  6. "Action plan" for asthma flare up:   A. Flovent 110 - 3 inhalations 3 times a day  B. albuterol MDI - 2 inhalations every 4-6 hours if needed  7. Further evaluation and treatment?

## 2016-05-27 ENCOUNTER — Other Ambulatory Visit: Payer: Self-pay | Admitting: Family Medicine

## 2016-05-28 ENCOUNTER — Ambulatory Visit (INDEPENDENT_AMBULATORY_CARE_PROVIDER_SITE_OTHER): Payer: Medicare Other | Admitting: Emergency Medicine

## 2016-05-28 ENCOUNTER — Encounter: Payer: Self-pay | Admitting: Emergency Medicine

## 2016-05-28 ENCOUNTER — Other Ambulatory Visit: Payer: Self-pay | Admitting: Family Medicine

## 2016-05-28 DIAGNOSIS — R9389 Abnormal findings on diagnostic imaging of other specified body structures: Secondary | ICD-10-CM

## 2016-05-28 DIAGNOSIS — J4520 Mild intermittent asthma, uncomplicated: Secondary | ICD-10-CM | POA: Diagnosis not present

## 2016-05-28 DIAGNOSIS — R938 Abnormal findings on diagnostic imaging of other specified body structures: Secondary | ICD-10-CM

## 2016-05-28 DIAGNOSIS — F32A Depression, unspecified: Secondary | ICD-10-CM

## 2016-05-28 DIAGNOSIS — F329 Major depressive disorder, single episode, unspecified: Secondary | ICD-10-CM

## 2016-05-28 DIAGNOSIS — F419 Anxiety disorder, unspecified: Principal | ICD-10-CM

## 2016-05-28 NOTE — Assessment & Plan Note (Signed)
Unclear asthma diagnosis but she does have clinical symptoms consistent with this when appropriate triggers are present, also possible evidence airtrapping on Ct chest vs old interstitial changes. This includes upper rest or infections or active allergies. Her pulmonary function testing was reassuring but this was done when she was not having active symptoms. I don't believe she needs maintenance inhale corticosteroids at this time (she isn't using anyway). She does need to keep her albuterol available to use as needed. I've encouraged her to follow-up with Dr. Neldon Mc is planned to continue to troubleshoot her allergic disease. I suspect she'll need immunotherapy. We will wait for his recommendations.

## 2016-05-28 NOTE — Telephone Encounter (Signed)
Refill sent per [facility] refill protocol/SLS  

## 2016-05-28 NOTE — Progress Notes (Signed)
Subjective:    Patient ID: [name], female    DOB: 19-Jul-1955, 60 y.o.   MRN: [id number]  HPI 60 yo woman, never smoker, hx of breast CA (90's) with auto-stem tx, non-ischemic CM from chemo, CAD, allergies, IBS, HH. She was noted to have pulm toxicity from her chemo, followed by CT scan and PFT.    PULMONARY FUNCTON TEST 05/31/2008 06/24/2008 01/08/2011  FVC 3.12 3.27 2.93  FEV1 2.46 2.54 2.22  FEV1/FVC 78.8 77.7 75.8  FVC % Predicted 91 93 84  FEV % Predicted 95 95 85  FeF 25-75 2.27 2.29 1.86  FeF 25-75 % Predicted 2.94 2.98 2.92   FEV1 2.30L at [facility] 02/07/13  Myocardial perfusion Imaging 05/01/14 > negative   ROV 08/26/14 -- follow-up visit for exertional dyspnea, possibly related to some degree of interstitial disease from prior chemotherapy for breast cancer. She has CT scan of her chest in 04/2014 that suggested a possible progression of pneumonitis compared with 09/2013. Also noted was some mild interstitial atelectasis in the right middle lobe as well as some emphysematous change. She underwent repeat peripheral function testing on 08/26/14 that show an improvement in her FVC and FEV1 compared with July 2012. She is feeling better, wonders if some of her sx were due to a drop off in her exercise routine. Her spirometry and flow - volume curve suggest some very mild obstruction.   ROV 04/27/16 -- patient has a history of interstitial disease and dyspnea that has been ascribed to pneumonitis due to chemotherapy for breast cancer. She also has some very mild obstruction on pulmonary function testing. We have been following her with serial CT scans and spirometry. Last seen February 2016.  For about the last year she has been dealing with sweats, sore throat, sinus drainage off and on. She has been treated for PNA at least once, for bronchitis on a few occasions. May have gotten some short term relief. She has been dealing with allergic rhinitis, chronic  dry cough. She describes this as congestion, drainage. She is interested in getting allergy testing. On loratadine daily, flonase prn. She is no longer on flovent, has albuterol available but has not used.   ROV 05/28/16 -- this follow-up visit for interstitial lung disease that has been ascribed to pneumonitis following autologous bone marrow transplant and chemotherapy for breast cancer. She also has a history of possible  .mild obstruction on pulmonary function testing. Her one month ago at which time she described having increased rhinitis, cough, congestion and drainage. I referred her to see Dr. Neldon Mc with allergy medicine and she is planning to undergo skin testing soon. Given her history of interstitial disease we repeated her CT scan of the chest on 05/18/16 that I personally reviewed. This showed no evidence of pulmonary embolism, some pectus excavatum, mild atelectasis in the medial segment of the right middle lobe without other significant pulmonary findings.      Objective:   Physical Exam Vitals:   05/28/16 1412  BP: 134/84  Pulse: (!) 102  SpO2: 97%  Weight: 188 lb (85.3 kg)  Height: 5' 5.5" (1.664 m)    Gen: Pleasant,well-nourished, in no distress,  normal affect  ENT: No lesions,  mouth clear,  oropharynx clear, no postnasal drip  Neck: No JVD, no TMG, no carotid bruits  Lungs: No use of accessory muscles, clear without rales or rhonchi  Cardiovascular: RRR, heart sounds normal, no murmur or gallops, no peripheral edema  Musculoskeletal: No deformities, no cyanosis  or clubbing  Neuro: alert, non focal  Skin: Warm, no lesions or rashes    04/29/14 --  COMPARISON: Portable chest obtained earlier today. The chest CTA dated 10/15/2013. FINDINGS: Normally opacified pulmonary arteries with no pulmonary arterial filling defects. Interval linear density in the medial aspect of the right middle lobe. Interval mild increase in prominence of the interstitial markings  with resultant visualization of some bullous changes in the left lower lobe. No lung nodules or enlarged lymph nodes. Unremarkable upper abdomen. Mild thoracic spine degenerative changes. Review of the MIP images confirms the above findings.  IMPRESSION: 1. No pulmonary emboli. 2. Interval mild interstitial pulmonary edema. Interstitial pneumonitis is less likely. 3. Interval linear atelectasis in the right middle lobe. 4. Mild changes of COPD      Assessment & Plan:  Asthma Unclear asthma diagnosis but she does have clinical symptoms consistent with this when appropriate triggers are present, also possible evidence airtrapping on Ct chest vs old interstitial changes. This includes upper rest or infections or active allergies. Her pulmonary function testing was reassuring but this was done when she was not having active symptoms. I don't believe she needs maintenance inhale corticosteroids at this time (she isn't using anyway). She does need to keep her albuterol available to use as needed. I've encouraged her to follow-up with Dr. Neldon Mc is planned to continue to troubleshoot her allergic disease. I suspect she'll need immunotherapy. We will wait for his recommendations.  Abnormal CT of the chest History of pneumonitis in the aftermath of acute lung injury from remote autologous bone marrow transplant. There is no significant evidence of residual disease present. She does have some right-sided scar following radiation therapy.  Baltazar Apo, MD, PhD 05/28/2016, 2:35 PM [facility] [phone] or if no answer [phone]

## 2016-05-28 NOTE — Patient Instructions (Signed)
We will stop your flovent based on your reassuring pulmonary function testing  Continue to have albuterol available to use 2 puffs as needed for shortness of breath Your mild asthma appears to flare with activity of your allergies. Please follow with Dr Neldon Mc as planned to diagnose and treat your allergies.  Your CT scan of the chest shows stable right sided mild scar, little other abnormality except for some mild prominence left over from prior pneumonitis. No evidence for active disease. Follow with Dr Lamonte Sakai in 6 months or sooner if you have any problems

## 2016-05-28 NOTE — Assessment & Plan Note (Signed)
History of pneumonitis in the aftermath of acute lung injury from remote autologous bone marrow transplant. There is no significant evidence of residual disease present. She does have some right-sided scar following radiation therapy.

## 2016-05-31 LAB — PNEUMOCOCCAL IM (14 SEROTYPE)
Pneumo Ab Type 1*: 0.2 ug/mL — ABNORMAL LOW (ref >1.3)
Pneumo Ab Type 12 (12F)*: 0.5 ug/mL — ABNORMAL LOW (ref >1.3)
Pneumo Ab Type 14*: >46.7 ug/mL (ref >1.3)
Pneumo Ab Type 19 (19F)*: 5.8 ug/mL (ref >1.3)
Pneumo Ab Type 23 (23F)*: 0.7 ug/mL — ABNORMAL LOW (ref >1.3)
Pneumo Ab Type 26 (6B)*: 1.2 ug/mL — ABNORMAL LOW (ref >1.3)
Pneumo Ab Type 3*: 0.3 ug/mL — ABNORMAL LOW (ref >1.3)
Pneumo Ab Type 4*: 0.9 ug/mL — ABNORMAL LOW (ref >1.3)
Pneumo Ab Type 51 (7F)*: 3.5 ug/mL (ref >1.3)
Pneumo Ab Type 56 (18C)*: >19 ug/mL (ref >1.3)
Pneumo Ab Type 57 (19A)*: 0.9 ug/mL — ABNORMAL LOW (ref >1.3)
Pneumo Ab Type 68 (9V)*: 0.8 ug/mL — ABNORMAL LOW (ref >1.3)
Pneumo Ab Type 8*: 5.6 ug/mL (ref >1.3)
Pneumo Ab Type 9 (9N)*: 0.3 ug/mL — ABNORMAL LOW (ref >1.3)

## 2016-05-31 LAB — IGG, IGA, IGM
IgA/Immunoglobulin A, Serum: 275 mg/dL (ref 87–352)
IgG (Immunoglobin G), Serum: 813 mg/dL (ref 700–1600)
IgM (Immunoglobulin M), Srm: 148 mg/dL (ref 26–217)

## 2016-05-31 LAB — TETANUS ANTIBODY, IGG: Tetanus Ab, IgG: 2.16 IU/mL (ref <0.10)

## 2016-06-04 ENCOUNTER — Ambulatory Visit
Admission: RE | Admit: 2016-06-04 | Discharge: 2016-06-04 | Disposition: A | Discharge status: Home / Self Care | Payer: Medicare Other | Source: Ambulatory Visit | Attending: Allergy and Immunology | Admitting: Allergy and Immunology

## 2016-06-04 ENCOUNTER — Encounter: Payer: Self-pay | Admitting: Family Medicine

## 2016-06-04 DIAGNOSIS — J0101 Acute recurrent maxillary sinusitis: Secondary | ICD-10-CM | POA: Diagnosis not present

## 2016-06-04 DIAGNOSIS — J329 Chronic sinusitis, unspecified: Secondary | ICD-10-CM

## 2016-06-08 ENCOUNTER — Ambulatory Visit (INDEPENDENT_AMBULATORY_CARE_PROVIDER_SITE_OTHER): Payer: Medicare Other | Admitting: Allergy and Immunology

## 2016-06-08 ENCOUNTER — Encounter: Payer: Self-pay | Admitting: Allergy and Immunology

## 2016-06-08 VITALS — BP 132/88 | HR 72 | Resp 20

## 2016-06-08 DIAGNOSIS — J4520 Mild intermittent asthma, uncomplicated: Secondary | ICD-10-CM

## 2016-06-08 DIAGNOSIS — J3089 Other allergic rhinitis: Secondary | ICD-10-CM | POA: Diagnosis not present

## 2016-06-08 NOTE — Progress Notes (Signed)
[name] returns to this clinic to have skin testing performed. Allergy skin testing directed against both aeroallergens and foods were performed. She did not demonstrate any hypersensitivity against either foods or aeroallergens. Based on her previous blood test results she will receive a Pneumovax vaccination and we will recheck her anti-pneumococcal antibody titers one month after that vaccination.

## 2016-07-01 ENCOUNTER — Encounter: Payer: Self-pay | Admitting: Family Medicine

## 2016-07-01 ENCOUNTER — Other Ambulatory Visit: Payer: Self-pay | Admitting: Family Medicine

## 2016-07-01 ENCOUNTER — Ambulatory Visit (INDEPENDENT_AMBULATORY_CARE_PROVIDER_SITE_OTHER): Payer: Medicare HMO | Admitting: Family Medicine

## 2016-07-01 VITALS — BP 118/82 | HR 81 | Temp 98.0°F | Wt 187.0 lb

## 2016-07-01 DIAGNOSIS — R197 Diarrhea, unspecified: Secondary | ICD-10-CM | POA: Diagnosis not present

## 2016-07-01 DIAGNOSIS — E559 Vitamin D deficiency, unspecified: Secondary | ICD-10-CM

## 2016-07-01 DIAGNOSIS — K219 Gastro-esophageal reflux disease without esophagitis: Secondary | ICD-10-CM | POA: Diagnosis not present

## 2016-07-01 DIAGNOSIS — Z23 Encounter for immunization: Secondary | ICD-10-CM | POA: Diagnosis not present

## 2016-07-01 DIAGNOSIS — E782 Mixed hyperlipidemia: Secondary | ICD-10-CM

## 2016-07-01 DIAGNOSIS — E039 Hypothyroidism, unspecified: Secondary | ICD-10-CM

## 2016-07-01 LAB — COMPREHENSIVE METABOLIC PANEL
ALT: 25 U/L (ref 0–35)
AST: 26 U/L (ref 0–37)
Albumin: 4.2 g/dL (ref 3.5–5.2)
Alkaline Phosphatase: 101 U/L (ref 39–117)
BUN: 23 mg/dL (ref 6–23)
CO2: 30 mEq/L (ref 19–32)
Calcium: 8.8 mg/dL (ref 8.4–10.5)
Chloride: 102 mEq/L (ref 96–112)
Creatinine, Ser: 0.96 mg/dL (ref 0.40–1.20)
GFR: 63.17 mL/min (ref >60.00)
Glucose, Bld: 106 mg/dL — ABNORMAL HIGH (ref 70–99)
Potassium: 4.1 mEq/L (ref 3.5–5.1)
Sodium: 139 mEq/L (ref 135–145)
Total Bilirubin: 0.4 mg/dL (ref 0.2–1.2)
Total Protein: 7.2 g/dL (ref 6.0–8.3)

## 2016-07-01 LAB — VITAMIN D 25 HYDROXY (VIT D DEFICIENCY, FRACTURES): VITD: 27.7 ng/mL — ABNORMAL LOW (ref 30.00–100.00)

## 2016-07-01 LAB — CBC
HCT: 43.5 % (ref 36.0–46.0)
Hemoglobin: 14.6 g/dL (ref 12.0–15.0)
MCHC: 33.6 g/dL (ref 30.0–36.0)
MCV: 92.8 fl (ref 78.0–100.0)
Platelets: 288 10*3/uL (ref 150.0–400.0)
RBC: 4.69 Mil/uL (ref 3.87–5.11)
RDW: 13.6 % (ref 11.5–15.5)
WBC: 6.1 10*3/uL (ref 4.0–10.5)

## 2016-07-01 LAB — LIPID PANEL
Cholesterol: 211 mg/dL — ABNORMAL HIGH (ref 0–200)
HDL: 55.3 mg/dL (ref >39.00)
LDL Cholesterol: 139 mg/dL — ABNORMAL HIGH (ref 0–99)
NonHDL: 155.89
Total CHOL/HDL Ratio: 4
Triglycerides: 82 mg/dL (ref 0.0–149.0)
VLDL: 16.4 mg/dL (ref 0.0–40.0)

## 2016-07-01 LAB — TSH: TSH: 5.85 u[IU]/mL — ABNORMAL HIGH (ref 0.35–4.50)

## 2016-07-01 MED ORDER — FLUOXETINE HCL 20 MG PO TABS: 20.0000 mg | ORAL_TABLET | Freq: Every day | ORAL | 0 refills | Status: DC

## 2016-07-01 MED ORDER — LEVOTHYROXINE SODIUM 75 MCG PO TABS: 75.0000 ug | ORAL_TABLET | Freq: Every day | ORAL | 0 refills | Status: DC

## 2016-07-01 MED ORDER — BUPROPION HCL ER (XL) 300 MG PO TB24: 300.0000 mg | ORAL_TABLET | Freq: Every day | ORAL | 1 refills | Status: DC

## 2016-07-01 MED ORDER — ALPRAZOLAM 0.5 MG PO TABS: ORAL_TABLET | ORAL | 1 refills | Status: DC

## 2016-07-01 NOTE — Assessment & Plan Note (Signed)
Avoid offending foods, start probiotics. Do not eat large meals in late evening and consider raising head of bed.  
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
## 2016-07-01 NOTE — Assessment & Plan Note (Signed)
Encouraged heart healthy diet, increase exercise, avoid trans fats, consider a krill oil cap daily 

## 2016-07-01 NOTE — Assessment & Plan Note (Signed)
Doing well no recent flares.  

## 2016-07-01 NOTE — Progress Notes (Signed)
Pre visit review using our clinic review tool, if applicable. No additional management support is needed unless otherwise documented below in the visit note. 

## 2016-07-01 NOTE — Assessment & Plan Note (Signed)
Fluoxetine at 10 partially helpful will increase to 20 mg daily

## 2016-07-01 NOTE — Patient Instructions (Signed)

## 2016-07-01 NOTE — Assessment & Plan Note (Signed)
Check Vitamin D today, taking a daily OTC tab

## 2016-07-04 ENCOUNTER — Encounter: Payer: Self-pay | Admitting: Family Medicine

## 2016-07-05 ENCOUNTER — Other Ambulatory Visit: Payer: Self-pay

## 2016-07-05 DIAGNOSIS — F419 Anxiety disorder, unspecified: Principal | ICD-10-CM

## 2016-07-05 DIAGNOSIS — F32A Depression, unspecified: Secondary | ICD-10-CM

## 2016-07-05 DIAGNOSIS — F329 Major depressive disorder, single episode, unspecified: Secondary | ICD-10-CM

## 2016-07-05 MED ORDER — MIRTAZAPINE 15 MG PO TBDP: ORAL_TABLET | ORAL | 0 refills | Status: DC

## 2016-07-07 ENCOUNTER — Telehealth: Payer: Self-pay

## 2016-07-07 NOTE — Telephone Encounter (Signed)
Patient is aware 

## 2016-07-07 NOTE — Telephone Encounter (Signed)
Patient Received her Pneumonia Vaccine on 07/01/2016, she is requesting to do Pneumococcal labs again.  Please Advise

## 2016-07-07 NOTE — Telephone Encounter (Signed)
Please informed patient that she should have those studies performed mid February 2018

## 2016-07-07 NOTE — Telephone Encounter (Signed)
Please advise 

## 2016-07-23 ENCOUNTER — Ambulatory Visit
Admission: RE | Admit: 2016-07-23 | Discharge: 2016-07-23 | Disposition: A | Discharge status: Home / Self Care | Payer: Medicare HMO | Source: Ambulatory Visit | Attending: Oncology | Admitting: Oncology

## 2016-07-23 ENCOUNTER — Other Ambulatory Visit: Payer: Self-pay | Admitting: Oncology

## 2016-07-23 DIAGNOSIS — Z1231 Encounter for screening mammogram for malignant neoplasm of breast: Secondary | ICD-10-CM

## 2016-07-23 DIAGNOSIS — N6320 Unspecified lump in the left breast, unspecified quadrant: Secondary | ICD-10-CM

## 2016-07-26 ENCOUNTER — Ambulatory Visit
Admission: RE | Admit: 2016-07-26 | Discharge: 2016-07-26 | Disposition: A | Discharge status: Home / Self Care | Payer: Medicare HMO | Source: Ambulatory Visit | Attending: Oncology | Admitting: Oncology

## 2016-07-26 ENCOUNTER — Other Ambulatory Visit: Payer: Self-pay | Admitting: Oncology

## 2016-07-26 DIAGNOSIS — N644 Mastodynia: Secondary | ICD-10-CM | POA: Diagnosis not present

## 2016-07-26 DIAGNOSIS — N6310 Unspecified lump in the right breast, unspecified quadrant: Secondary | ICD-10-CM

## 2016-07-26 DIAGNOSIS — R922 Inconclusive mammogram: Secondary | ICD-10-CM | POA: Diagnosis not present

## 2016-07-26 DIAGNOSIS — N6320 Unspecified lump in the left breast, unspecified quadrant: Secondary | ICD-10-CM

## 2016-07-29 ENCOUNTER — Telehealth: Payer: Self-pay | Admitting: Oncology

## 2016-07-29 ENCOUNTER — Other Ambulatory Visit: Payer: Self-pay | Admitting: Family Medicine

## 2016-07-29 MED ORDER — FLUOXETINE HCL 20 MG PO TABS: 20.0000 mg | ORAL_TABLET | Freq: Every day | ORAL | 2 refills | Status: DC

## 2016-07-29 NOTE — Telephone Encounter (Signed)
spoke to patient to advise that appointment has changed from 08/25/16 to 09/07/16 at 2:30pm with Dr. [name]

## 2016-08-14 ENCOUNTER — Other Ambulatory Visit: Payer: Self-pay | Admitting: Family Medicine

## 2016-08-14 DIAGNOSIS — F32A Depression, unspecified: Secondary | ICD-10-CM

## 2016-08-14 DIAGNOSIS — F329 Major depressive disorder, single episode, unspecified: Secondary | ICD-10-CM

## 2016-08-14 DIAGNOSIS — F419 Anxiety disorder, unspecified: Principal | ICD-10-CM

## 2016-08-17 ENCOUNTER — Telehealth: Payer: Self-pay | Admitting: Oncology

## 2016-08-17 NOTE — Telephone Encounter (Signed)
Patient called to reschedule her lab appointment from 08/18/16 to 08/31/16.

## 2016-08-18 ENCOUNTER — Other Ambulatory Visit: Payer: Self-pay

## 2016-08-20 ENCOUNTER — Ambulatory Visit (HOSPITAL_BASED_OUTPATIENT_CLINIC_OR_DEPARTMENT_OTHER)
Admission: RE | Admit: 2016-08-20 | Discharge: 2016-08-20 | Disposition: A | Discharge status: Home / Self Care | Payer: Medicare HMO | Source: Ambulatory Visit | Attending: Medical | Admitting: Medical

## 2016-08-20 ENCOUNTER — Encounter: Payer: Self-pay | Admitting: Medical

## 2016-08-20 ENCOUNTER — Ambulatory Visit (INDEPENDENT_AMBULATORY_CARE_PROVIDER_SITE_OTHER): Payer: Medicare HMO | Admitting: Medical

## 2016-08-20 VITALS — BP 108/72 | HR 108 | Temp 98.2°F | Ht 65.0 in | Wt 186.1 lb

## 2016-08-20 DIAGNOSIS — R059 Cough, unspecified: Secondary | ICD-10-CM

## 2016-08-20 DIAGNOSIS — J0100 Acute maxillary sinusitis, unspecified: Secondary | ICD-10-CM | POA: Diagnosis not present

## 2016-08-20 DIAGNOSIS — J40 Bronchitis, not specified as acute or chronic: Secondary | ICD-10-CM | POA: Diagnosis not present

## 2016-08-20 DIAGNOSIS — R52 Pain, unspecified: Secondary | ICD-10-CM

## 2016-08-20 DIAGNOSIS — R05 Cough: Secondary | ICD-10-CM

## 2016-08-20 LAB — CBC WITH DIFFERENTIAL/PLATELET
Basophils Absolute: 82 cells/uL (ref 0–200)
Basophils Relative: 1 %
Eosinophils Absolute: 82 cells/uL (ref 15–500)
Eosinophils Relative: 1 %
HCT: 44.2 % (ref 35.0–45.0)
Hemoglobin: 14.9 g/dL (ref 11.7–15.5)
Lymphocytes Relative: 22 %
Lymphs Abs: 1804 cells/uL (ref 850–3900)
MCH: 30.8 pg (ref 27.0–33.0)
MCHC: 33.7 g/dL (ref 32.0–36.0)
MCV: 91.3 fL (ref 80.0–100.0)
MPV: 9.3 fL (ref 7.5–12.5)
Monocytes Absolute: 656 cells/uL (ref 200–950)
Monocytes Relative: 8 %
Neutro Abs: 5576 cells/uL (ref 1500–7800)
Neutrophils Relative %: 68 %
Platelets: 293 10*3/uL (ref 140–400)
RBC: 4.84 MIL/uL (ref 3.80–5.10)
RDW: 13.7 % (ref 11.0–15.0)
WBC: 8.2 10*3/uL (ref 3.8–10.8)

## 2016-08-20 LAB — POC INFLUENZA A&B (BINAX/QUICKVUE)
Influenza A, POC: NEGATIVE
Influenza B, POC: NEGATIVE

## 2016-08-20 MED ORDER — DOXYCYCLINE HYCLATE 100 MG PO TABS
100.0000 mg | ORAL_TABLET | Freq: Two times a day (BID) | ORAL | 0 refills | Status: DC
Start: 2016-08-20 — End: 2016-09-01

## 2016-08-20 MED ORDER — HYDROCODONE-HOMATROPINE 5-1.5 MG/5ML PO SYRP: 5.0000 mL | ORAL_SOLUTION | Freq: Three times a day (TID) | ORAL | 0 refills | Status: DC | PRN

## 2016-08-20 MED ORDER — CYCLOBENZAPRINE HCL 5 MG PO TABS: 5.0000 mg | ORAL_TABLET | Freq: Every day | ORAL | 0 refills | Status: DC
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
## 2016-08-20 NOTE — Progress Notes (Signed)
Pre visit review using our clinic review tool, if applicable. No additional management support is needed unless otherwise documented below in the visit note. 

## 2016-08-20 NOTE — Addendum Note (Signed)
Addended by: Anabel Halon on: 08/20/2016 05:42 PM   Modules accepted: Orders

## 2016-08-20 NOTE — Patient Instructions (Addendum)
You appear to have bronchitis and sinusitis. Rest hydrate and tylenol for fever. I am prescribing cough medicine hycodan, and doxycycline antibiotic. For your nasal congestion  Use flonase.   Please get chest xray today and cbc.  If wheezing use albuterol   Follow up in 7-10 days or as needed

## 2016-08-23 MED ORDER — ALBUTEROL SULFATE HFA 108 (90 BASE) MCG/ACT IN AERS: 2.0000 | INHALATION_SPRAY | RESPIRATORY_TRACT | 5 refills | Status: DC | PRN

## 2016-08-24 ENCOUNTER — Telehealth: Payer: Self-pay | Admitting: Family Medicine

## 2016-08-24 MED ORDER — PREDNISONE 10 MG PO TABS: ORAL_TABLET | ORAL | 0 refills | Status: DC

## 2016-08-24 NOTE — Telephone Encounter (Signed)
I am printing 6 day taper dose of prednisone. Call pt and notify that. Please fax over the rx to her pharmacy. With taper dose of prednisone she should be some improved. Start med tomorrow and be seen on Friday before the weekend if she feels not some better as we approach the weekend.   Please help me remember to check printer and sign rx. Writing the rx off site and with long instruction it won't go to the pharmacy electronically.

## 2016-08-24 NOTE — Telephone Encounter (Signed)
Patient called stating that the inhaler that [name] prescribed on 08/23/16 is not covered by her insurance. She states that she called her insurance company and they told her that Symbicort or Memory Dance is covered. She would like to know if she could get either of these inhalers called in instead. Please advise  Pharmacy: [id number] - [place], [place] - [address]  Phone: [phone]

## 2016-08-24 NOTE — Telephone Encounter (Signed)
Patient called back to state that she is not feeling any better and was told that if she doesn't feel better than Percell Miller could start a steroid taper. Please advise

## 2016-08-25 ENCOUNTER — Ambulatory Visit: Payer: Self-pay | Admitting: Oncology

## 2016-08-25 ENCOUNTER — Encounter: Payer: Self-pay | Admitting: *Deleted

## 2016-08-25 ENCOUNTER — Telehealth: Payer: Self-pay | Admitting: Medical

## 2016-08-25 NOTE — Telephone Encounter (Signed)
Please run the inhalers refill rx by Dr. Charlett Blake as you mentioned. Forward request to her.

## 2016-08-25 NOTE — Telephone Encounter (Signed)
Patient called back to ask provider to call in Rx for Symbicort and Brio inhalers are the only two covered under her insurance. Plse call one of these into the pharmacy.

## 2016-08-25 NOTE — Progress Notes (Signed)
Viewed in [facility]

## 2016-08-25 NOTE — Telephone Encounter (Signed)
Dr, Rhae Lerner' patient, if ok will call in to pharmacy.

## 2016-08-25 NOTE — Telephone Encounter (Signed)
Rx request faxed to pharmacy; LMOVM to inform patient with contact name & number/SLS 02/28

## 2016-08-26 ENCOUNTER — Other Ambulatory Visit: Payer: Self-pay

## 2016-08-27 ENCOUNTER — Telehealth: Payer: Self-pay | Admitting: Allergy and Immunology

## 2016-08-27 ENCOUNTER — Other Ambulatory Visit: Payer: Self-pay | Admitting: *Deleted

## 2016-08-27 DIAGNOSIS — J4520 Mild intermittent asthma, uncomplicated: Secondary | ICD-10-CM

## 2016-08-27 DIAGNOSIS — D849 Immunodeficiency, unspecified: Secondary | ICD-10-CM

## 2016-08-27 MED ORDER — BUDESONIDE-FORMOTEROL FUMARATE 80-4.5 MCG/ACT IN AERO: 2.0000 | INHALATION_SPRAY | Freq: Two times a day (BID) | RESPIRATORY_TRACT | 3 refills | Status: DC

## 2016-08-27 NOTE — Progress Notes (Signed)
New Rx to pharmacy per provider/SLS 03/02

## 2016-08-27 NOTE — Telephone Encounter (Signed)
Pt called and said that she has been sick and has not went to get labs for aniti pneumococcal antibody. She was suppose to go last month. [phone].

## 2016-08-30 ENCOUNTER — Telehealth: Payer: Self-pay | Admitting: Oncology

## 2016-08-30 NOTE — Telephone Encounter (Signed)
patient called to reschedule appointment/date per patient request. 3.5.18

## 2016-08-30 NOTE — Telephone Encounter (Signed)
Pt got her pneumococcal booster on 07/01/16. She forgot to get the labs drawn for titer. She needs a lab order for titer. Please advise.

## 2016-08-30 NOTE — Telephone Encounter (Signed)
Please obtain anti-pneumo 14 blood test.

## 2016-08-31 ENCOUNTER — Other Ambulatory Visit: Payer: Self-pay

## 2016-08-31 ENCOUNTER — Telehealth: Payer: Self-pay | Admitting: Family Medicine

## 2016-08-31 NOTE — Telephone Encounter (Signed)
Patient Name: [name] DOB: 05-17-1956 Initial Comment caller states she has congestion and cough Nurse Assessment Nurse: Ronnald Ramp, RN, Miranda Date/Time (Eastern Time): 08/31/2016 11:45:21 AM Confirm and document reason for call. If symptomatic, describe symptoms. ---Caller states she was treated for bronchitis and sinus infection > 10 days ago. She also took steroids. She finished both antibiotics and steroids yesterday. Her symptoms were improving, but today worse again. She has alot of congestion with blood streaked mucus and has a mild cough. Denies fever. Does the patient have any new or worsening symptoms? ---Yes Will a triage be completed? ---Yes Related visit to physician within the last 2 weeks? ---Yes Does the PT have any chronic conditions? (i.e. diabetes, asthma, etc.) ---Yes List chronic conditions. ---Allergies, Thyroid, Hx of breast cancer Is this a behavioral health or substance abuse call? ---No Guidelines Guideline Title Affirmed Question Affirmed Notes Sinus Infection on Antibiotic Follow-up Call [1] Taking antibiotic > 72 hours (3 days) AND [2] sinus pain not improved Final Disposition User See Physician within 24 Hours Jones, RN, Miranda Comments No appts available with PCP within recommended time frame. Appt scheduled for tomorrow 3/7 with Debbrah Alar at 8:45am. There has been some confusion over her inhaler. She was prescribed Proventil via refill and states the insurance told her that was not covered. She states the insurance company told her they would cover Symbicort or Brio. Told caller she needs to verify this information with the insurance company and the MD. Referrals REFERRED TO PCP OFFICE Disagree/Comply: Comply

## 2016-08-31 NOTE — Telephone Encounter (Signed)
FYI. Appt w/ you 09/01/16 at 0845.

## 2016-08-31 NOTE — Telephone Encounter (Signed)
Noted  

## 2016-09-01 ENCOUNTER — Encounter: Payer: Self-pay | Admitting: Family

## 2016-09-01 ENCOUNTER — Telehealth: Payer: Self-pay | Admitting: Family

## 2016-09-01 ENCOUNTER — Ambulatory Visit (HOSPITAL_BASED_OUTPATIENT_CLINIC_OR_DEPARTMENT_OTHER)
Admission: RE | Admit: 2016-09-01 | Discharge: 2016-09-01 | Disposition: A | Discharge status: Home / Self Care | Payer: Medicare HMO | Source: Ambulatory Visit | Attending: Family | Admitting: Family

## 2016-09-01 ENCOUNTER — Ambulatory Visit (INDEPENDENT_AMBULATORY_CARE_PROVIDER_SITE_OTHER): Payer: Medicare HMO | Admitting: Family

## 2016-09-01 VITALS — BP 97/58 | HR 79 | Temp 98.5°F | Resp 16 | Ht 65.0 in | Wt 187.6 lb

## 2016-09-01 DIAGNOSIS — R05 Cough: Secondary | ICD-10-CM | POA: Insufficient documentation

## 2016-09-01 DIAGNOSIS — J40 Bronchitis, not specified as acute or chronic: Secondary | ICD-10-CM

## 2016-09-01 DIAGNOSIS — J321 Chronic frontal sinusitis: Secondary | ICD-10-CM | POA: Diagnosis not present

## 2016-09-01 DIAGNOSIS — R059 Cough, unspecified: Secondary | ICD-10-CM

## 2016-09-01 DIAGNOSIS — J4520 Mild intermittent asthma, uncomplicated: Secondary | ICD-10-CM

## 2016-09-01 LAB — CBC WITH DIFFERENTIAL/PLATELET
Basophils Absolute: 0.1 10*3/uL (ref 0.0–0.1)
Basophils Relative: 1.1 % (ref 0.0–3.0)
Eosinophils Absolute: 0.1 10*3/uL (ref 0.0–0.7)
Eosinophils Relative: 1.8 % (ref 0.0–5.0)
HCT: 42.1 % (ref 36.0–46.0)
Hemoglobin: 14 g/dL (ref 12.0–15.0)
Lymphocytes Relative: 29.4 % (ref 12.0–46.0)
Lymphs Abs: 2.2 10*3/uL (ref 0.7–4.0)
MCHC: 33.2 g/dL (ref 30.0–36.0)
MCV: 94 fl (ref 78.0–100.0)
Monocytes Absolute: 0.6 10*3/uL (ref 0.1–1.0)
Monocytes Relative: 8.3 % (ref 3.0–12.0)
Neutro Abs: 4.5 10*3/uL (ref 1.4–7.7)
Neutrophils Relative %: 59.4 % (ref 43.0–77.0)
Platelets: 272 10*3/uL (ref 150.0–400.0)
RBC: 4.48 Mil/uL (ref 3.87–5.11)
RDW: 14 % (ref 11.5–15.5)
WBC: 7.6 10*3/uL (ref 4.0–10.5)

## 2016-09-01 LAB — COMPREHENSIVE METABOLIC PANEL
ALT: 21 U/L (ref 0–35)
AST: 23 U/L (ref 0–37)
Albumin: 3.7 g/dL (ref 3.5–5.2)
Alkaline Phosphatase: 105 U/L (ref 39–117)
BUN: 27 mg/dL — ABNORMAL HIGH (ref 6–23)
CO2: 32 mEq/L (ref 19–32)
Calcium: 8.8 mg/dL (ref 8.4–10.5)
Chloride: 100 mEq/L (ref 96–112)
Creatinine, Ser: 0.91 mg/dL (ref 0.40–1.20)
GFR: 67.15 mL/min (ref >60.00)
Glucose, Bld: 96 mg/dL (ref 70–99)
Potassium: 3.2 mEq/L — ABNORMAL LOW (ref 3.5–5.1)
Sodium: 139 mEq/L (ref 135–145)
Total Bilirubin: 0.5 mg/dL (ref 0.2–1.2)
Total Protein: 6.3 g/dL (ref 6.0–8.3)

## 2016-09-01 MED ORDER — BUDESONIDE-FORMOTEROL FUMARATE 80-4.5 MCG/ACT IN AERO: 2.0000 | INHALATION_SPRAY | Freq: Two times a day (BID) | RESPIRATORY_TRACT | 3 refills | Status: DC

## 2016-09-01 MED ORDER — FUROSEMIDE 40 MG PO TABS: 40.0000 mg | ORAL_TABLET | Freq: Every day | ORAL | 2 refills | Status: DC | PRN

## 2016-09-01 MED ORDER — ALBUTEROL SULFATE HFA 108 (90 BASE) MCG/ACT IN AERS: 2.0000 | INHALATION_SPRAY | Freq: Four times a day (QID) | RESPIRATORY_TRACT | 0 refills | Status: DC | PRN

## 2016-09-01 MED ORDER — AZITHROMYCIN 250 MG PO TABS: ORAL_TABLET | ORAL | 0 refills | Status: DC

## 2016-09-01 NOTE — Telephone Encounter (Signed)
No signs of bronchitis or pneumonia on chest x ray. I would recommend that she start azithromycin for her sinus infection. Mucinex as needed.  OK to start symbicort/ventolin as we discussed at her visit. Let us know if symptoms worsen or if symptoms are not improved in 3-4 days.

## 2016-09-01 NOTE — Patient Instructions (Addendum)
You may use mucinex as needed. Please complete chest x ray on the first floor. Begin symbicort twice daily and continue for next few weeks. If breathing is improved you my discontinue after 2 weeks. You may use Ventolin (albuterol) every 6 hours as needed for chest tightness/wheezing.  Call if new/worsening symptoms or if not improved in 1 week.
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
## 2016-09-01 NOTE — Telephone Encounter (Signed)
Informed patient to go for blood test at [facility]. Orders placed.

## 2016-09-01 NOTE — Telephone Encounter (Signed)
Notified pt and she voices understanding. 

## 2016-09-01 NOTE — Progress Notes (Signed)
Pre visit review using our clinic review tool, if applicable. No additional management support is needed unless otherwise documented below in the visit note. 

## 2016-09-02 ENCOUNTER — Other Ambulatory Visit: Payer: Self-pay | Admitting: Family Medicine

## 2016-09-02 ENCOUNTER — Telehealth: Payer: Self-pay | Admitting: Family

## 2016-09-02 DIAGNOSIS — E876 Hypokalemia: Secondary | ICD-10-CM

## 2016-09-02 MED ORDER — POTASSIUM CHLORIDE CRYS ER 20 MEQ PO TBCR: EXTENDED_RELEASE_TABLET | ORAL | 1 refills | Status: DC

## 2016-09-02 NOTE — Telephone Encounter (Signed)
Patient informed of results/instructions. Scheduled lab in one week and put in order. She verbalized understanding

## 2016-09-02 NOTE — Telephone Encounter (Signed)
Potassium is low.  I would like her to take two tablets of Kdur today. Take 1 tab kdur each time she takes a dose of lasix.  Repeat bmet in 1 week.  Dx hypokalemia.

## 2016-09-06 ENCOUNTER — Encounter: Payer: Self-pay | Admitting: Family

## 2016-09-07 ENCOUNTER — Ambulatory Visit: Payer: Self-pay | Admitting: Oncology

## 2016-09-07 ENCOUNTER — Ambulatory Visit: Payer: Medicare Other | Admitting: Allergy and Immunology

## 2016-09-07 DIAGNOSIS — D849 Immunodeficiency, unspecified: Secondary | ICD-10-CM | POA: Diagnosis not present

## 2016-09-08 ENCOUNTER — Other Ambulatory Visit (INDEPENDENT_AMBULATORY_CARE_PROVIDER_SITE_OTHER): Payer: Medicare HMO

## 2016-09-08 ENCOUNTER — Telehealth: Payer: Self-pay | Admitting: *Deleted

## 2016-09-08 DIAGNOSIS — E876 Hypokalemia: Secondary | ICD-10-CM | POA: Diagnosis not present

## 2016-09-08 LAB — BASIC METABOLIC PANEL
BUN: 27 mg/dL — ABNORMAL HIGH (ref 6–23)
CO2: 27 mEq/L (ref 19–32)
Calcium: 9.1 mg/dL (ref 8.4–10.5)
Chloride: 105 mEq/L (ref 96–112)
Creatinine, Ser: 0.99 mg/dL (ref 0.40–1.20)
GFR: 60.92 mL/min (ref >60.00)
Glucose, Bld: 124 mg/dL — ABNORMAL HIGH (ref 70–99)
Potassium: 4.2 mEq/L (ref 3.5–5.1)
Sodium: 139 mEq/L (ref 135–145)

## 2016-09-08 NOTE — Telephone Encounter (Signed)
LM for patient to return call to schedule AWV.   

## 2016-09-10 ENCOUNTER — Encounter: Payer: Self-pay | Admitting: Family Medicine

## 2016-09-10 ENCOUNTER — Ambulatory Visit (INDEPENDENT_AMBULATORY_CARE_PROVIDER_SITE_OTHER): Payer: Medicare HMO | Admitting: Family Medicine

## 2016-09-10 VITALS — BP 136/81 | HR 87 | Temp 98.1°F | Ht 65.0 in | Wt 190.4 lb

## 2016-09-10 DIAGNOSIS — F419 Anxiety disorder, unspecified: Secondary | ICD-10-CM

## 2016-09-10 DIAGNOSIS — T7840XA Allergy, unspecified, initial encounter: Secondary | ICD-10-CM

## 2016-09-10 DIAGNOSIS — E039 Hypothyroidism, unspecified: Secondary | ICD-10-CM | POA: Diagnosis not present

## 2016-09-10 DIAGNOSIS — C50811 Malignant neoplasm of overlapping sites of right female breast: Secondary | ICD-10-CM

## 2016-09-10 DIAGNOSIS — T7840XS Allergy, unspecified, sequela: Secondary | ICD-10-CM

## 2016-09-10 DIAGNOSIS — J4520 Mild intermittent asthma, uncomplicated: Secondary | ICD-10-CM

## 2016-09-10 DIAGNOSIS — M25562 Pain in left knee: Secondary | ICD-10-CM | POA: Diagnosis not present

## 2016-09-10 DIAGNOSIS — F418 Other specified anxiety disorders: Secondary | ICD-10-CM

## 2016-09-10 DIAGNOSIS — M25561 Pain in right knee: Secondary | ICD-10-CM

## 2016-09-10 DIAGNOSIS — Z853 Personal history of malignant neoplasm of breast: Secondary | ICD-10-CM

## 2016-09-10 DIAGNOSIS — F32A Depression, unspecified: Secondary | ICD-10-CM

## 2016-09-10 DIAGNOSIS — J301 Allergic rhinitis due to pollen: Secondary | ICD-10-CM

## 2016-09-10 DIAGNOSIS — R69 Illness, unspecified: Secondary | ICD-10-CM | POA: Diagnosis not present

## 2016-09-10 DIAGNOSIS — F329 Major depressive disorder, single episode, unspecified: Secondary | ICD-10-CM

## 2016-09-10 HISTORY — DX: Allergy, unspecified, initial encounter: T78.40XA

## 2016-09-10 LAB — PNEUMOCOCCAL IM (14 SEROTYPE)
Pneumo Ab Type 1*: 3.7 ug/mL (ref >1.3)
Pneumo Ab Type 12 (12F)*: 1.4 ug/mL (ref >1.3)
Pneumo Ab Type 14*: 31.6 ug/mL (ref >1.3)
Pneumo Ab Type 19 (19F)*: 7.5 ug/mL (ref >1.3)
Pneumo Ab Type 23 (23F)*: 7 ug/mL (ref >1.3)
Pneumo Ab Type 26 (6B)*: 1.5 ug/mL (ref >1.3)
Pneumo Ab Type 3*: 0.4 ug/mL — ABNORMAL LOW (ref >1.3)
Pneumo Ab Type 4*: 3.1 ug/mL (ref >1.3)
Pneumo Ab Type 51 (7F)*: 6 ug/mL (ref >1.3)
Pneumo Ab Type 56 (18C)*: 25 ug/mL (ref >1.3)
Pneumo Ab Type 57 (19A)*: 3.4 ug/mL (ref >1.3)
Pneumo Ab Type 68 (9V)*: 2.6 ug/mL (ref >1.3)
Pneumo Ab Type 8*: >27.2 ug/mL (ref >1.3)
Pneumo Ab Type 9 (9N)*: 1.9 ug/mL (ref >1.3)

## 2016-09-10 LAB — TSH: TSH: 1.83 u[IU]/mL (ref 0.35–4.50)

## 2016-09-10 MED ORDER — MIRTAZAPINE 15 MG PO TBDP: ORAL_TABLET | ORAL | Status: DC

## 2016-09-10 MED ORDER — FLUOXETINE HCL 20 MG PO TABS: 20.0000 mg | ORAL_TABLET | Freq: Every day | ORAL | 2 refills | Status: DC

## 2016-09-10 NOTE — Assessment & Plan Note (Signed)
Improving but still some mild cough and sob at times. Should try Symbicort 1 puff po bid x 1-2 weeks then as needed. Albuterol as needed. Call if symptoms worsen

## 2016-09-10 NOTE — Assessment & Plan Note (Signed)
Encouraged to follow up with Dr Nelva Bush for her increased knee and back pain

## 2016-09-10 NOTE — Assessment & Plan Note (Signed)
Following with allergy specialist now, Dr Baldemar Lenis, had a pneumococcal antibody test last week, has a f/u appt next week. They are going to test some antibiotics for true allergies, including Rocephin?
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
## 2016-09-10 NOTE — Patient Instructions (Signed)
Hypothyroidism Hypothyroidism is a disorder of the thyroid. The thyroid is a large gland that is located in the lower front of the neck. The thyroid releases hormones that control how the body works. With hypothyroidism, the thyroid does not make enough of these hormones. What are the causes? Causes of hypothyroidism may include:  Viral infections.  Pregnancy.  Your own defense system (immune system) attacking your thyroid.  Certain medicines.  Birth defects.  Past radiation treatments to your head or neck.  Past treatment with radioactive iodine.  Past surgical removal of part or all of your thyroid.  Problems with the gland that is located in the center of your brain (pituitary).  What are the signs or symptoms? Signs and symptoms of hypothyroidism may include:  Feeling as though you have no energy (lethargy).  Inability to tolerate cold.  Weight gain that is not explained by a change in diet or exercise habits.  Dry skin.  Coarse hair.  Menstrual irregularity.  Slowing of thought processes.  Constipation.  Sadness or depression.  How is this diagnosed? Your health care provider may diagnose hypothyroidism with blood tests and ultrasound tests. How is this treated? Hypothyroidism is treated with medicine that replaces the hormones that your body does not make. After you begin treatment, it may take several weeks for symptoms to go away. Follow these instructions at home:  Take medicines only as directed by your health care provider.  If you start taking any new medicines, tell your health care provider.  Keep all follow-up visits as directed by your health care provider. This is important. As your condition improves, your dosage needs may change. You will need to have blood tests regularly so that your health care provider can watch your condition. Contact a health care provider if:  Your symptoms do not get better with treatment.  You are taking thyroid  replacement medicine and: ? You sweat excessively. ? You have tremors. ? You feel anxious. ? You lose weight rapidly. ? You cannot tolerate heat. ? You have emotional swings. ? You have diarrhea. ? You feel weak. Get help right away if:  You develop chest pain.  You develop an irregular heartbeat.  You develop a rapid heartbeat. This information is not intended to replace advice given to you by your health care provider. Make sure you discuss any questions you have with your health care provider. Document Released: 06/14/2005 Document Revised: 11/20/2015 Document Reviewed: 10/30/2013 Elsevier Interactive Patient Education  2017 [employer]  

## 2016-09-10 NOTE — Assessment & Plan Note (Signed)
Had to reschedule her appt with her breast surgeon due to acute illness but she is encouraged to reschedule her appt due to her palpable lesion in left breast despite MGM result

## 2016-09-10 NOTE — Progress Notes (Signed)
Pre visit review using our clinic review tool, if applicable. No additional management support is needed unless otherwise documented below in the visit note. 

## 2016-09-12 NOTE — Assessment & Plan Note (Signed)
Has a lesion in left breast on recent mgm, radiologically looks OK but she agrees to follow up with her surgeon Dr Excell Seltzer.

## 2016-09-12 NOTE — Assessment & Plan Note (Signed)
On Levothyroxine, continue to monitor 

## 2016-09-12 NOTE — Assessment & Plan Note (Signed)
Has allergy testing set up with Dr Baldemar Lenis.

## 2016-09-12 NOTE — Assessment & Plan Note (Signed)
Still struggles with some anhedonia but it is manageable at the present time no change in meds.

## 2016-09-14 ENCOUNTER — Ambulatory Visit (INDEPENDENT_AMBULATORY_CARE_PROVIDER_SITE_OTHER): Payer: Medicare HMO | Admitting: Family Medicine

## 2016-09-14 ENCOUNTER — Encounter: Payer: Self-pay | Admitting: Family Medicine

## 2016-09-14 VITALS — BP 126/80 | HR 113 | Temp 98.3°F | Resp 16 | Ht 65.5 in | Wt 189.2 lb

## 2016-09-14 DIAGNOSIS — J324 Chronic pansinusitis: Secondary | ICD-10-CM | POA: Diagnosis not present

## 2016-09-14 DIAGNOSIS — R062 Wheezing: Secondary | ICD-10-CM

## 2016-09-14 DIAGNOSIS — R509 Fever, unspecified: Secondary | ICD-10-CM

## 2016-09-14 DIAGNOSIS — J40 Bronchitis, not specified as acute or chronic: Secondary | ICD-10-CM | POA: Diagnosis not present

## 2016-09-14 LAB — POC URINALSYSI DIPSTICK (AUTOMATED)
Bilirubin, UA: NEGATIVE
Blood, UA: NEGATIVE
Glucose, UA: NEGATIVE
Ketones, UA: 4
Leukocytes, UA: NEGATIVE
Nitrite, UA: NEGATIVE
Spec Grav, UA: 1.03 (ref 1.030–1.035)
Urobilinogen, UA: 0.2 (ref ?–2.0)
pH, UA: 6 (ref 5.0–8.0)

## 2016-09-14 MED ORDER — AMOXICILLIN-POT CLAVULANATE 875-125 MG PO TABS: 1.0000 | ORAL_TABLET | Freq: Two times a day (BID) | ORAL | 0 refills | Status: DC

## 2016-09-14 MED ORDER — ALBUTEROL SULFATE (2.5 MG/3ML) 0.083% IN NEBU
2.5000 mg | INHALATION_SOLUTION | Freq: Once | RESPIRATORY_TRACT | Status: AC
  Administered 2016-09-14: 2.5 mg via RESPIRATORY_TRACT

## 2016-09-14 MED ORDER — METHYLPREDNISOLONE ACETATE 80 MG/ML IJ SUSP
80.0000 mg | Freq: Once | INTRAMUSCULAR | Status: AC
  Administered 2016-09-14: 80 mg via INTRAMUSCULAR

## 2016-09-14 MED ORDER — PREDNISONE 10 MG PO TABS: ORAL_TABLET | ORAL | 0 refills | Status: DC

## 2016-09-14 NOTE — Progress Notes (Signed)
Pre visit review using our clinic review tool, if applicable. No additional management support is needed unless otherwise documented below in the visit note. 

## 2016-09-14 NOTE — Patient Instructions (Signed)
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
## 2016-09-15 ENCOUNTER — Ambulatory Visit
Admission: RE | Admit: 2016-09-15 | Discharge: 2016-09-15 | Disposition: A | Discharge status: Home / Self Care | Payer: Medicare HMO | Source: Ambulatory Visit | Attending: Family Medicine | Admitting: Family Medicine

## 2016-09-15 ENCOUNTER — Ambulatory Visit: Payer: Medicare Other | Admitting: Allergy and Immunology

## 2016-09-15 DIAGNOSIS — R062 Wheezing: Secondary | ICD-10-CM

## 2016-09-15 DIAGNOSIS — R918 Other nonspecific abnormal finding of lung field: Secondary | ICD-10-CM | POA: Diagnosis not present

## 2016-09-15 DIAGNOSIS — J40 Bronchitis, not specified as acute or chronic: Secondary | ICD-10-CM

## 2016-09-15 DIAGNOSIS — R509 Fever, unspecified: Secondary | ICD-10-CM

## 2016-09-15 LAB — COMPREHENSIVE METABOLIC PANEL
ALT: 23 U/L (ref 0–35)
AST: 27 U/L (ref 0–37)
Albumin: 4.1 g/dL (ref 3.5–5.2)
Alkaline Phosphatase: 97 U/L (ref 39–117)
BUN: 23 mg/dL (ref 6–23)
CO2: 25 mEq/L (ref 19–32)
Calcium: 8.9 mg/dL (ref 8.4–10.5)
Chloride: 103 mEq/L (ref 96–112)
Creatinine, Ser: 1.26 mg/dL — ABNORMAL HIGH (ref 0.40–1.20)
GFR: 46.12 mL/min — ABNORMAL LOW (ref >60.00)
Glucose, Bld: 96 mg/dL (ref 70–99)
Potassium: 3.8 mEq/L (ref 3.5–5.1)
Sodium: 137 mEq/L (ref 135–145)
Total Bilirubin: 0.4 mg/dL (ref 0.2–1.2)
Total Protein: 6.8 g/dL (ref 6.0–8.3)

## 2016-09-15 LAB — CBC WITH DIFFERENTIAL/PLATELET
Basophils Absolute: 0.1 10*3/uL (ref 0.0–0.1)
Basophils Relative: 1.1 % (ref 0.0–3.0)
Eosinophils Absolute: 0 10*3/uL (ref 0.0–0.7)
Eosinophils Relative: 0.8 % (ref 0.0–5.0)
HCT: 43.4 % (ref 36.0–46.0)
Hemoglobin: 14.2 g/dL (ref 12.0–15.0)
Lymphocytes Relative: 30.5 % (ref 12.0–46.0)
Lymphs Abs: 1.8 10*3/uL (ref 0.7–4.0)
MCHC: 32.8 g/dL (ref 30.0–36.0)
MCV: 93.8 fl (ref 78.0–100.0)
Monocytes Absolute: 0.8 10*3/uL (ref 0.1–1.0)
Monocytes Relative: 13.9 % — ABNORMAL HIGH (ref 3.0–12.0)
Neutro Abs: 3.2 10*3/uL (ref 1.4–7.7)
Neutrophils Relative %: 53.7 % (ref 43.0–77.0)
Platelets: 285 10*3/uL (ref 150.0–400.0)
RBC: 4.63 Mil/uL (ref 3.87–5.11)
RDW: 14 % (ref 11.5–15.5)
WBC: 6 10*3/uL (ref 4.0–10.5)

## 2016-09-16 ENCOUNTER — Ambulatory Visit: Payer: Self-pay | Admitting: *Deleted

## 2016-09-16 ENCOUNTER — Encounter: Payer: Self-pay | Admitting: Family Medicine

## 2016-09-17 ENCOUNTER — Telehealth: Payer: Self-pay | Admitting: Oncology

## 2016-09-17 NOTE — Telephone Encounter (Signed)
Patient not feeling well called in to r/s appt. Patient is aware of new date and time.

## 2016-09-17 NOTE — Progress Notes (Signed)
Patient informed of results.  PC

## 2016-09-20 ENCOUNTER — Other Ambulatory Visit: Payer: Self-pay

## 2016-09-21 DIAGNOSIS — M170 Bilateral primary osteoarthritis of knee: Secondary | ICD-10-CM | POA: Diagnosis not present

## 2016-09-27 ENCOUNTER — Ambulatory Visit: Payer: Self-pay | Admitting: Oncology

## 2016-09-27 ENCOUNTER — Other Ambulatory Visit: Payer: Self-pay

## 2016-09-27 MED ORDER — LEVOTHYROXINE SODIUM 75 MCG PO TABS: 75.0000 ug | ORAL_TABLET | Freq: Every day | ORAL | 1 refills | Status: DC

## 2016-09-27 MED ORDER — ALPRAZOLAM 0.5 MG PO TABS: ORAL_TABLET | ORAL | 1 refills | Status: DC

## 2016-09-27 NOTE — Telephone Encounter (Signed)
Requesting:   alprazolam Contract    None UDS    None Last OV   09/10/2016----future appt scheduled for 11/11/2016 Last Refill   #90 with 1 refills on 07/01/2016 Please Advise

## 2016-09-27 NOTE — Telephone Encounter (Signed)
Received Rx request for Xanax 0.5mg  (take 1 tab po TID prn for anxiety or sleep) #90, 0RF  Last RF: 07/01/2016 Last OV: 09/10/2016 Next OV: 09/28/2016 UDS: None  Forwarded to Provider for review, approval or denial.

## 2016-09-28 ENCOUNTER — Encounter: Payer: Self-pay | Admitting: Medical

## 2016-09-28 ENCOUNTER — Ambulatory Visit (INDEPENDENT_AMBULATORY_CARE_PROVIDER_SITE_OTHER): Payer: Medicare HMO | Admitting: Medical

## 2016-09-28 VITALS — BP 138/90 | HR 81 | Temp 98.4°F | Resp 16 | Ht 66.0 in | Wt 187.6 lb

## 2016-09-28 DIAGNOSIS — Z7722 Contact with and (suspected) exposure to environmental tobacco smoke (acute) (chronic): Secondary | ICD-10-CM

## 2016-09-28 DIAGNOSIS — R05 Cough: Secondary | ICD-10-CM

## 2016-09-28 DIAGNOSIS — R062 Wheezing: Secondary | ICD-10-CM

## 2016-09-28 DIAGNOSIS — R059 Cough, unspecified: Secondary | ICD-10-CM

## 2016-09-28 MED ORDER — PREDNISONE 10 MG PO TABS: ORAL_TABLET | ORAL | 0 refills | Status: DC

## 2016-09-28 NOTE — Progress Notes (Signed)
Pre visit review using our clinic review tool, if applicable. No additional management support is needed unless otherwise documented below in the visit note. 
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
## 2016-09-28 NOTE — Patient Instructions (Signed)
For your persisting cough and wheezing will rx tapered prednisone and want you to start symbicort daily. Use albuterol if needed.  I have referred you to pulmonologist. I am trying to get you in within 1-2 weeks. You can call Dr. Lamonte Sakai office to try to set up quicker.  I am putting in future order cxr to get if you feel worsen again. Decided not to put you on other antibiotic as you requested.  Follow up in 2 weeks or as needed

## 2016-09-29 ENCOUNTER — Other Ambulatory Visit: Payer: Self-pay | Admitting: Family Medicine

## 2016-09-29 DIAGNOSIS — F329 Major depressive disorder, single episode, unspecified: Secondary | ICD-10-CM

## 2016-09-29 DIAGNOSIS — F419 Anxiety disorder, unspecified: Principal | ICD-10-CM

## 2016-09-29 DIAGNOSIS — F32A Depression, unspecified: Secondary | ICD-10-CM

## 2016-10-01 DIAGNOSIS — M1711 Unilateral primary osteoarthritis, right knee: Secondary | ICD-10-CM | POA: Diagnosis not present

## 2016-10-04 ENCOUNTER — Other Ambulatory Visit: Payer: Self-pay

## 2016-10-05 ENCOUNTER — Other Ambulatory Visit (HOSPITAL_BASED_OUTPATIENT_CLINIC_OR_DEPARTMENT_OTHER): Payer: Medicare HMO

## 2016-10-05 DIAGNOSIS — Z853 Personal history of malignant neoplasm of breast: Secondary | ICD-10-CM | POA: Diagnosis not present

## 2016-10-05 DIAGNOSIS — C50811 Malignant neoplasm of overlapping sites of right female breast: Secondary | ICD-10-CM

## 2016-10-05 LAB — CBC WITH DIFFERENTIAL/PLATELET
BASO%: 0.6 % (ref 0.0–2.0)
Basophils Absolute: 0.1 10*3/uL (ref 0.0–0.1)
EOS%: 1 % (ref 0.0–7.0)
Eosinophils Absolute: 0.1 10*3/uL (ref 0.0–0.5)
HCT: 42.3 % (ref 34.8–46.6)
HGB: 13.9 g/dL (ref 11.6–15.9)
LYMPH%: 23.7 % (ref 14.0–49.7)
MCH: 31.1 pg (ref 25.1–34.0)
MCHC: 32.8 g/dL (ref 31.5–36.0)
MCV: 94.7 fL (ref 79.5–101.0)
MONO#: 0.6 10*3/uL (ref 0.1–0.9)
MONO%: 7.4 % (ref 0.0–14.0)
NEUT#: 5.6 10*3/uL (ref 1.5–6.5)
NEUT%: 67.3 % (ref 38.4–76.8)
Platelets: 271 10*3/uL (ref 145–400)
RBC: 4.46 10*6/uL (ref 3.70–5.45)
RDW: 14.3 % (ref 11.2–14.5)
WBC: 8.3 10*3/uL (ref 3.9–10.3)
lymph#: 2 10*3/uL (ref 0.9–3.3)

## 2016-10-05 LAB — COMPREHENSIVE METABOLIC PANEL
ALT: 25 U/L (ref 0–55)
AST: 22 U/L (ref 5–34)
Albumin: 3.5 g/dL (ref 3.5–5.0)
Alkaline Phosphatase: 105 U/L (ref 40–150)
Anion Gap: 8 mEq/L (ref 3–11)
BUN: 25.6 mg/dL (ref 7.0–26.0)
CO2: 27 mEq/L (ref 22–29)
Calcium: 8.9 mg/dL (ref 8.4–10.4)
Chloride: 105 mEq/L (ref 98–109)
Creatinine: 0.9 mg/dL (ref 0.6–1.1)
EGFR: 74 mL/min/{1.73_m2} — ABNORMAL LOW (ref >90)
Glucose: 89 mg/dl (ref 70–140)
Potassium: 4.2 mEq/L (ref 3.5–5.1)
Sodium: 141 mEq/L (ref 136–145)
Total Bilirubin: 0.61 mg/dL (ref 0.20–1.20)
Total Protein: 6.4 g/dL (ref 6.4–8.3)

## 2016-10-11 ENCOUNTER — Ambulatory Visit (HOSPITAL_BASED_OUTPATIENT_CLINIC_OR_DEPARTMENT_OTHER): Payer: Medicare HMO | Admitting: Oncology

## 2016-10-11 VITALS — BP 149/53 | HR 88 | Temp 97.7°F | Resp 18 | Ht 66.0 in | Wt 192.3 lb

## 2016-10-11 DIAGNOSIS — R922 Inconclusive mammogram: Secondary | ICD-10-CM | POA: Diagnosis not present

## 2016-10-11 DIAGNOSIS — Z853 Personal history of malignant neoplasm of breast: Secondary | ICD-10-CM

## 2016-10-11 DIAGNOSIS — Z170 Estrogen receptor positive status [ER+]: Principal | ICD-10-CM

## 2016-10-11 DIAGNOSIS — M8580 Other specified disorders of bone density and structure, unspecified site: Secondary | ICD-10-CM | POA: Diagnosis not present

## 2016-10-11 DIAGNOSIS — C50811 Malignant neoplasm of overlapping sites of right female breast: Secondary | ICD-10-CM
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
## 2016-10-12 DIAGNOSIS — M1711 Unilateral primary osteoarthritis, right knee: Secondary | ICD-10-CM | POA: Diagnosis not present

## 2016-10-19 ENCOUNTER — Encounter: Payer: Self-pay | Admitting: Family Medicine

## 2016-10-20 DIAGNOSIS — M1711 Unilateral primary osteoarthritis, right knee: Secondary | ICD-10-CM | POA: Diagnosis not present
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
## 2016-10-25 ENCOUNTER — Encounter: Payer: Self-pay | Admitting: Cardiology

## 2016-10-25 ENCOUNTER — Ambulatory Visit (INDEPENDENT_AMBULATORY_CARE_PROVIDER_SITE_OTHER): Payer: Medicare HMO | Admitting: Cardiology

## 2016-10-25 ENCOUNTER — Other Ambulatory Visit: Payer: Self-pay | Admitting: *Deleted

## 2016-10-25 DIAGNOSIS — I2510 Atherosclerotic heart disease of native coronary artery without angina pectoris: Secondary | ICD-10-CM

## 2016-10-25 DIAGNOSIS — Q261 Persistent left superior vena cava: Secondary | ICD-10-CM | POA: Diagnosis not present

## 2016-10-25 DIAGNOSIS — I428 Other cardiomyopathies: Secondary | ICD-10-CM | POA: Insufficient documentation

## 2016-10-25 DIAGNOSIS — R0602 Shortness of breath: Secondary | ICD-10-CM | POA: Diagnosis not present

## 2016-10-25 DIAGNOSIS — E785 Hyperlipidemia, unspecified: Secondary | ICD-10-CM

## 2016-10-25 NOTE — Patient Instructions (Addendum)
There were no medication changes at this visit.   Your physician has requested that you have an echocardiogram. Echocardiography is a painless test that uses sound waves to create images of your heart. It provides your doctor with information about the size and shape of your heart and how well your heart's chambers and valves are working. This procedure takes approximately one hour. There are no restrictions for this procedure. This will take place at [facility] [address]   Your physician recommends that you return for fasting Lipid within one month  Your physician wants you to follow-up in: 6 months with Dr. Percival Spanish.  You will receive a reminder letter in the mail two months in advance. If you don't receive a letter, please call our office to schedule the follow-up appointment.

## 2016-10-26 DIAGNOSIS — M5136 Other intervertebral disc degeneration, lumbar region: Secondary | ICD-10-CM | POA: Diagnosis not present

## 2016-11-03 ENCOUNTER — Ambulatory Visit (INDEPENDENT_AMBULATORY_CARE_PROVIDER_SITE_OTHER): Payer: Medicare HMO | Admitting: Emergency Medicine

## 2016-11-03 ENCOUNTER — Encounter: Payer: Self-pay | Admitting: Emergency Medicine

## 2016-11-03 DIAGNOSIS — J4520 Mild intermittent asthma, uncomplicated: Secondary | ICD-10-CM | POA: Diagnosis not present

## 2016-11-03 NOTE — Patient Instructions (Addendum)
Please continue your albuterol 2 puffs as needed for shortness of breath or chest tightness.  We will hold off on restarting Symbicort for now.  Please continue your loratadine 10mg  daily You are stable to schedule your knee surgery. There are no breathing barriers to a procedure or to general anesthesia at this time.  Follow with Dr Lamonte Sakai in 6 months or sooner if you have any problems

## 2016-11-03 NOTE — Progress Notes (Signed)
Subjective:    Patient ID: [name], female    DOB: [birth year], 61 y.o.   MRN: [id number]  HPI 61 yo woman, never smoker, hx of breast CA (90's) with auto-stem tx, non-ischemic CM from chemo, CAD, allergies, IBS, HH. She was noted to have pulm toxicity from her chemo, followed by CT scan and PFT.    PULMONARY FUNCTON TEST 05/31/2008 06/24/2008 01/08/2011  FVC 3.12 3.27 2.93  FEV1 2.46 2.54 2.22  FEV1/FVC 78.8 77.7 75.8  FVC % Predicted 91 93 84  FEV % Predicted 95 95 85  FeF 25-75 2.27 2.29 1.86  FeF 25-75 % Predicted 2.94 2.98 2.92   FEV1 2.30L at [facility] 02/07/13  Myocardial perfusion Imaging 05/01/14 > negative   ROV 08/26/14 -- follow-up visit for exertional dyspnea, possibly related to some degree of interstitial disease from prior chemotherapy for breast cancer. She has CT scan of her chest in 04/2014 that suggested a possible progression of pneumonitis compared with 09/2013. Also noted was some mild interstitial atelectasis in the right middle lobe as well as some emphysematous change. She underwent repeat peripheral function testing on 08/26/14 that show an improvement in her FVC and FEV1 compared with July 2012. She is feeling better, wonders if some of her sx were due to a drop off in her exercise routine. Her spirometry and flow - volume curve suggest some very mild obstruction.   ROV 04/27/16 -- patient has a history of interstitial disease and dyspnea that has been ascribed to pneumonitis due to chemotherapy for breast cancer. She also has some very mild obstruction on pulmonary function testing. We have been following her with serial CT scans and spirometry. Last seen February 2016.  For about the last year she has been dealing with sweats, sore throat, sinus drainage off and on. She has been treated for PNA at least once, for bronchitis on a few occasions. May have gotten some short term relief. She has been dealing with allergic rhinitis, chronic  dry cough. She describes this as congestion, drainage. She is interested in getting allergy testing. On loratadine daily, flonase prn. She is no longer on flovent, has albuterol available but has not used.   ROV 05/28/16 -- this follow-up visit for interstitial lung disease that has been ascribed to pneumonitis following autologous bone marrow transplant and chemotherapy for breast cancer. She also has a history of possible  .mild obstruction on pulmonary function testing. Her one month ago at which time she described having increased rhinitis, cough, congestion and drainage. I referred her to see Dr. Neldon Mc with allergy medicine and she is planning to undergo skin testing soon. Given her history of interstitial disease we repeated her CT scan of the chest on 05/18/16 that I personally reviewed. This showed no evidence of pulmonary embolism, some pectus excavatum, mild atelectasis in the medial segment of the right middle lobe without other significant pulmonary findings.   ROV 11/03/16 - patient has a history of prior pneumonitis as detailed above. Also with a clinical history consistent with possible asthma. Also with allergic rhinitis. She has been treated on multiple occasions for URI and AE's. She has a hard time telling me exactly what sx she was happening, describing her sx. She doesn;t believe that she was having much cough, non-productive. She felt chest tightness, was dyspneic. She was tried on Symbicort since last visit, but she was only taking prn. Believes that it was started in March. Seemed to improve with albuterol. CT chest 09/16/16 was  reassuring > no infiltrates, no new findings. She has been better since March (off symbicort). She is not using flonase, she does take loratadine.      Objective:   Physical Exam Vitals:   11/03/16 1522 11/03/16 1525  BP:  124/78  Pulse:  (!) 105  SpO2:  98%  Weight: 187 lb (84.8 kg) 187 lb 6.4 oz (85 kg)  Height: 5\' 6"  (1.676 m) 5\' 6"  (1.676 m)   Gen:  Pleasant,well-nourished, in no distress,  normal affect  ENT: No lesions,  mouth clear,  oropharynx clear, no postnasal drip  Neck: No JVD, no TMG, no carotid bruits  Lungs: No use of accessory muscles, clear without rales or rhonchi  Cardiovascular: RRR, heart sounds normal, no murmur or gallops, no peripheral edema  Musculoskeletal: No deformities, no cyanosis or clubbing  Neuro: alert, non focal  Skin: Warm, no lesions or rashes    04/29/14 --  COMPARISON: Portable chest obtained earlier today. The chest CTA dated 10/15/2013. FINDINGS: Normally opacified pulmonary arteries with no pulmonary arterial filling defects. Interval linear density in the medial aspect of the right middle lobe. Interval mild increase in prominence of the interstitial markings with resultant visualization of some bullous changes in the left lower lobe. No lung nodules or enlarged lymph nodes. Unremarkable upper abdomen. Mild thoracic spine degenerative changes. Review of the MIP images confirms the above findings.  IMPRESSION: 1. No pulmonary emboli. 2. Interval mild interstitial pulmonary edema. Interstitial pneumonitis is less likely. 3. Interval linear atelectasis in the right middle lobe. 4. Mild changes of COPD      Assessment & Plan:  Asthma Please continue your albuterol 2 puffs as needed for shortness of breath or chest tightness.  We will hold off on restarting Symbicort for now.  Please continue your loratadine 10mg  daily You are stable to schedule your knee surgery. There are no breathing barriers to a procedure or to general anesthesia at this time.  Follow with Dr Lamonte Sakai in 6 months or sooner if you have any problems  Baltazar Apo, MD, PhD 11/03/2016, 5:42 PM [facility] [phone] or if no answer [phone]

## 2016-11-03 NOTE — Assessment & Plan Note (Signed)
Please continue your albuterol 2 puffs as needed for shortness of breath or chest tightness.  We will hold off on restarting Symbicort for now.  Please continue your loratadine 10mg  daily You are stable to schedule your knee surgery. There are no breathing barriers to a procedure or to general anesthesia at this time.  Follow with [name] in 6 months or sooner if you have any problems

## 2016-11-06 ENCOUNTER — Emergency Department (HOSPITAL_BASED_OUTPATIENT_CLINIC_OR_DEPARTMENT_OTHER): Payer: Medicare HMO

## 2016-11-06 ENCOUNTER — Emergency Department (HOSPITAL_BASED_OUTPATIENT_CLINIC_OR_DEPARTMENT_OTHER)
Admission: EM | Admit: 2016-11-06 | Discharge: 2016-11-06 | Disposition: A | Discharge status: Home / Self Care | Payer: Medicare HMO | Attending: Emergency Medicine | Admitting: Emergency Medicine

## 2016-11-06 ENCOUNTER — Encounter (HOSPITAL_BASED_OUTPATIENT_CLINIC_OR_DEPARTMENT_OTHER): Payer: Self-pay | Admitting: *Deleted

## 2016-11-06 DIAGNOSIS — R6883 Chills (without fever): Secondary | ICD-10-CM | POA: Insufficient documentation

## 2016-11-06 DIAGNOSIS — R51 Headache: Secondary | ICD-10-CM | POA: Insufficient documentation

## 2016-11-06 DIAGNOSIS — Z853 Personal history of malignant neoplasm of breast: Secondary | ICD-10-CM | POA: Diagnosis not present

## 2016-11-06 DIAGNOSIS — J45909 Unspecified asthma, uncomplicated: Secondary | ICD-10-CM | POA: Diagnosis not present

## 2016-11-06 DIAGNOSIS — R0602 Shortness of breath: Secondary | ICD-10-CM | POA: Insufficient documentation

## 2016-11-06 DIAGNOSIS — R531 Weakness: Secondary | ICD-10-CM | POA: Insufficient documentation

## 2016-11-06 DIAGNOSIS — R079 Chest pain, unspecified: Secondary | ICD-10-CM | POA: Diagnosis not present

## 2016-11-06 DIAGNOSIS — R42 Dizziness and giddiness: Secondary | ICD-10-CM | POA: Insufficient documentation

## 2016-11-06 DIAGNOSIS — I2510 Atherosclerotic heart disease of native coronary artery without angina pectoris: Secondary | ICD-10-CM | POA: Diagnosis not present

## 2016-11-06 DIAGNOSIS — E039 Hypothyroidism, unspecified: Secondary | ICD-10-CM | POA: Insufficient documentation

## 2016-11-06 LAB — URINALYSIS, ROUTINE W REFLEX MICROSCOPIC
Bilirubin Urine: NEGATIVE
Glucose, UA: NEGATIVE mg/dL
Hgb urine dipstick: NEGATIVE
Ketones, ur: NEGATIVE mg/dL
Leukocytes, UA: NEGATIVE
Nitrite: NEGATIVE
Protein, ur: NEGATIVE mg/dL
Specific Gravity, Urine: 1.006 (ref 1.005–1.030)
pH: 5.5 (ref 5.0–8.0)

## 2016-11-06 LAB — CBC
HCT: 43.9 % (ref 36.0–46.0)
Hemoglobin: 14.7 g/dL (ref 12.0–15.0)
MCH: 31.6 pg (ref 26.0–34.0)
MCHC: 33.5 g/dL (ref 30.0–36.0)
MCV: 94.4 fL (ref 78.0–100.0)
Platelets: 255 10*3/uL (ref 150–400)
RBC: 4.65 MIL/uL (ref 3.87–5.11)
RDW: 13.9 % (ref 11.5–15.5)
WBC: 8 10*3/uL (ref 4.0–10.5)

## 2016-11-06 LAB — BASIC METABOLIC PANEL
Anion gap: 9 (ref 5–15)
BUN: 20 mg/dL (ref 6–20)
CO2: 24 mmol/L (ref 22–32)
Calcium: 8.8 mg/dL — ABNORMAL LOW (ref 8.9–10.3)
Chloride: 105 mmol/L (ref 101–111)
Creatinine, Ser: 0.93 mg/dL (ref 0.44–1.00)
GFR calc Af Amer: >60 mL/min (ref >60)
GFR calc non Af Amer: >60 mL/min (ref >60)
Glucose, Bld: 91 mg/dL (ref 65–99)
Potassium: 3.8 mmol/L (ref 3.5–5.1)
Sodium: 138 mmol/L (ref 135–145)

## 2016-11-06 LAB — TROPONIN I: Troponin I: <0.03 ng/mL (ref <0.03)

## 2016-11-06 MED ORDER — KETOROLAC TROMETHAMINE 15 MG/ML IJ SOLN
15.0000 mg | Freq: Once | INTRAMUSCULAR | Status: DC
  Filled 2016-11-06: qty 1

## 2016-11-06 MED ORDER — SODIUM CHLORIDE 0.9 % IV BOLUS (SEPSIS)
500.0000 mL | Freq: Once | INTRAVENOUS | Status: AC
  Administered 2016-11-06: 500 mL via INTRAVENOUS

## 2016-11-06 NOTE — Discharge Instructions (Signed)
Drink and eat regularly. Be sure that you are drinking plenty. Recheck with your primary care physician if not improving.

## 2016-11-06 NOTE — ED Triage Notes (Signed)
Pt c/o chest pan , SON, generalized weakness, ? uti x 1 week
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
## 2016-11-11 ENCOUNTER — Ambulatory Visit: Payer: Self-pay | Admitting: Family Medicine

## 2016-11-12 ENCOUNTER — Telehealth: Payer: Self-pay | Admitting: Family Medicine

## 2016-11-12 ENCOUNTER — Telehealth: Payer: Self-pay

## 2016-11-12 NOTE — Telephone Encounter (Signed)
Called patient to schedule awv. Patient stated that she will give office a call back to schedule appt.

## 2016-11-12 NOTE — Telephone Encounter (Signed)
Patient is on the list for [employer] 2018 and may be a good candidate for an AWV. Please let me know if/when appt is scheduled.   

## 2016-11-15 DIAGNOSIS — R69 Illness, unspecified: Secondary | ICD-10-CM | POA: Diagnosis not present

## 2016-11-15 HISTORY — PX: MOUTH SURGERY: SHX715

## 2016-11-19 DIAGNOSIS — M1711 Unilateral primary osteoarthritis, right knee: Secondary | ICD-10-CM | POA: Diagnosis not present

## 2016-11-19 DIAGNOSIS — S83281A Other tear of lateral meniscus, current injury, right knee, initial encounter: Secondary | ICD-10-CM | POA: Diagnosis not present

## 2016-11-19 DIAGNOSIS — G8918 Other acute postprocedural pain: Secondary | ICD-10-CM | POA: Diagnosis not present

## 2016-11-19 DIAGNOSIS — S83271A Complex tear of lateral meniscus, current injury, right knee, initial encounter: Secondary | ICD-10-CM | POA: Diagnosis not present

## 2016-11-19 DIAGNOSIS — M94261 Chondromalacia, right knee: Secondary | ICD-10-CM | POA: Diagnosis not present

## 2016-11-19 HISTORY — PX: KNEE ARTHROSCOPY: SHX127

## 2016-11-23 ENCOUNTER — Other Ambulatory Visit: Payer: Self-pay

## 2016-11-23 ENCOUNTER — Ambulatory Visit (HOSPITAL_COMMUNITY): Payer: Medicare HMO | Attending: Cardiovascular Disease

## 2016-11-23 DIAGNOSIS — I428 Other cardiomyopathies: Secondary | ICD-10-CM | POA: Diagnosis not present

## 2016-11-25 ENCOUNTER — Telehealth: Payer: Self-pay | Admitting: Cardiology

## 2016-11-25 NOTE — Telephone Encounter (Signed)
Spoke with pt, aware dr hochrein has not reviewed. preliminary report given.

## 2016-11-25 NOTE — Telephone Encounter (Signed)
New message    Pt is calling about the results from her echo.

## 2016-11-26 DIAGNOSIS — Z7409 Other reduced mobility: Secondary | ICD-10-CM | POA: Diagnosis not present

## 2016-11-30 DIAGNOSIS — Z7409 Other reduced mobility: Secondary | ICD-10-CM | POA: Diagnosis not present

## 2016-12-06 DIAGNOSIS — Z7409 Other reduced mobility: Secondary | ICD-10-CM | POA: Diagnosis not present

## 2016-12-15 ENCOUNTER — Encounter: Payer: Self-pay | Admitting: Family

## 2016-12-15 ENCOUNTER — Ambulatory Visit (INDEPENDENT_AMBULATORY_CARE_PROVIDER_SITE_OTHER): Payer: Medicare HMO | Admitting: Family

## 2016-12-15 VITALS — BP 141/93 | HR 110 | Temp 98.3°F | Resp 18 | Ht 66.0 in | Wt 184.6 lb

## 2016-12-15 DIAGNOSIS — N760 Acute vaginitis: Secondary | ICD-10-CM | POA: Diagnosis not present

## 2016-12-15 DIAGNOSIS — L304 Erythema intertrigo: Secondary | ICD-10-CM

## 2016-12-15 DIAGNOSIS — R5383 Other fatigue: Secondary | ICD-10-CM | POA: Diagnosis not present

## 2016-12-15 DIAGNOSIS — R110 Nausea: Secondary | ICD-10-CM

## 2016-12-15 DIAGNOSIS — E039 Hypothyroidism, unspecified: Secondary | ICD-10-CM

## 2016-12-15 LAB — COMPREHENSIVE METABOLIC PANEL
ALT: 24 U/L (ref 0–35)
AST: 21 U/L (ref 0–37)
Albumin: 3.9 g/dL (ref 3.5–5.2)
Alkaline Phosphatase: 88 U/L (ref 39–117)
BUN: 18 mg/dL (ref 6–23)
CO2: 28 mEq/L (ref 19–32)
Calcium: 8.9 mg/dL (ref 8.4–10.5)
Chloride: 100 mEq/L (ref 96–112)
Creatinine, Ser: 1.03 mg/dL (ref 0.40–1.20)
GFR: 58.15 mL/min — ABNORMAL LOW (ref >60.00)
Glucose, Bld: 100 mg/dL — ABNORMAL HIGH (ref 70–99)
Potassium: 3.7 mEq/L (ref 3.5–5.1)
Sodium: 136 mEq/L (ref 135–145)
Total Bilirubin: 0.6 mg/dL (ref 0.2–1.2)
Total Protein: 6.4 g/dL (ref 6.0–8.3)

## 2016-12-15 LAB — CBC WITH DIFFERENTIAL/PLATELET
Basophils Absolute: 0 10*3/uL (ref 0.0–0.1)
Basophils Relative: 0.8 % (ref 0.0–3.0)
Eosinophils Absolute: 0.1 10*3/uL (ref 0.0–0.7)
Eosinophils Relative: 1.7 % (ref 0.0–5.0)
HCT: 43.1 % (ref 36.0–46.0)
Hemoglobin: 14.3 g/dL (ref 12.0–15.0)
Lymphocytes Relative: 20.7 % (ref 12.0–46.0)
Lymphs Abs: 1.2 10*3/uL (ref 0.7–4.0)
MCHC: 33.1 g/dL (ref 30.0–36.0)
MCV: 96.1 fl (ref 78.0–100.0)
Monocytes Absolute: 0.5 10*3/uL (ref 0.1–1.0)
Monocytes Relative: 9 % (ref 3.0–12.0)
Neutro Abs: 3.9 10*3/uL (ref 1.4–7.7)
Neutrophils Relative %: 67.8 % (ref 43.0–77.0)
Platelets: 244 10*3/uL (ref 150.0–400.0)
RBC: 4.49 Mil/uL (ref 3.87–5.11)
RDW: 14.8 % (ref 11.5–15.5)
WBC: 5.7 10*3/uL (ref 4.0–10.5)

## 2016-12-15 LAB — URINALYSIS, ROUTINE W REFLEX MICROSCOPIC
Bilirubin Urine: NEGATIVE
Hgb urine dipstick: NEGATIVE
Ketones, ur: NEGATIVE
Leukocytes, UA: NEGATIVE
Nitrite: NEGATIVE
RBC / HPF: NONE SEEN (ref 0–?)
Specific Gravity, Urine: <=1.005 — AB (ref 1.000–1.030)
Total Protein, Urine: NEGATIVE
Urine Glucose: NEGATIVE
Urobilinogen, UA: 0.2 (ref 0.0–1.0)
WBC, UA: NONE SEEN (ref 0–?)
pH: 6 (ref 5.0–8.0)

## 2016-12-15 LAB — TSH: TSH: 1.25 u[IU]/mL (ref 0.35–4.50)

## 2016-12-15 LAB — LIPASE: Lipase: 15 U/L (ref 11.0–59.0)

## 2016-12-15 MED ORDER — ONDANSETRON 4 MG PO TBDP: 4.0000 mg | ORAL_TABLET | Freq: Three times a day (TID) | ORAL | 0 refills | Status: DC | PRN

## 2016-12-15 MED ORDER — FLUCONAZOLE 150 MG PO TABS: ORAL_TABLET | ORAL | 0 refills | Status: DC

## 2016-12-15 NOTE — Patient Instructions (Addendum)
Please ensure that you are taking in 6-8 glasses of water/day. You may use zofran as needed for nausea. Complete lab work prior to leaving. Call if new/worsening symptoms, if you develop a fever >101 or if symptoms are not improved in 1 week.  Please contact your oral surgeon for follow up as well due to your persistent swelling.   You may use tylenol as needed for headache.
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
## 2016-12-16 LAB — H. PYLORI BREATH TEST: H. pylori Breath Test: NOT DETECTED

## 2016-12-16 LAB — URINE CULTURE: Organism ID, Bacteria: NO GROWTH

## 2016-12-20 ENCOUNTER — Other Ambulatory Visit (HOSPITAL_COMMUNITY)
Admission: RE | Admit: 2016-12-20 | Discharge: 2016-12-20 | Disposition: A | Discharge status: Home / Self Care | Payer: Medicare HMO | Source: Ambulatory Visit | Attending: Family Medicine | Admitting: Family Medicine

## 2016-12-20 ENCOUNTER — Encounter: Payer: Self-pay | Admitting: Family Medicine

## 2016-12-20 ENCOUNTER — Ambulatory Visit (INDEPENDENT_AMBULATORY_CARE_PROVIDER_SITE_OTHER): Payer: Medicare HMO | Admitting: Family Medicine

## 2016-12-20 VITALS — BP 116/70 | HR 94 | Temp 97.7°F | Resp 16 | Ht 66.0 in | Wt 187.4 lb

## 2016-12-20 DIAGNOSIS — N760 Acute vaginitis: Secondary | ICD-10-CM | POA: Insufficient documentation

## 2016-12-20 DIAGNOSIS — B354 Tinea corporis: Secondary | ICD-10-CM | POA: Diagnosis not present

## 2016-12-20 MED ORDER — FLUCONAZOLE 150 MG PO TABS: 150.0000 mg | ORAL_TABLET | Freq: Every day | ORAL | 0 refills | Status: DC

## 2016-12-20 MED ORDER — CLOTRIMAZOLE-BETAMETHASONE 1-0.05 % EX CREA: 1.0000 "application " | TOPICAL_CREAM | Freq: Two times a day (BID) | CUTANEOUS | 0 refills | Status: DC
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
## 2016-12-20 NOTE — Patient Instructions (Signed)

## 2016-12-24 ENCOUNTER — Other Ambulatory Visit: Payer: Self-pay | Admitting: Family Medicine

## 2016-12-24 LAB — URINE CYTOLOGY ANCILLARY ONLY
Bacterial vaginitis: NEGATIVE
Candida vaginitis: NEGATIVE

## 2016-12-24 NOTE — Telephone Encounter (Signed)
Requesting:xanax Contract:No  UDS: no  Last OV:12/20/16 Last Refill:09/27/16  #[id number] : take 1 tablet by mouth three times a day if needed for anxiety and sleep Please Advise

## 2016-12-27 ENCOUNTER — Ambulatory Visit (INDEPENDENT_AMBULATORY_CARE_PROVIDER_SITE_OTHER): Payer: Medicare HMO | Admitting: Family Medicine

## 2016-12-27 ENCOUNTER — Encounter: Payer: Self-pay | Admitting: Family Medicine

## 2016-12-27 DIAGNOSIS — R000 Tachycardia, unspecified: Secondary | ICD-10-CM

## 2016-12-27 DIAGNOSIS — E860 Dehydration: Secondary | ICD-10-CM

## 2016-12-27 DIAGNOSIS — F418 Other specified anxiety disorders: Secondary | ICD-10-CM

## 2016-12-27 DIAGNOSIS — N760 Acute vaginitis: Secondary | ICD-10-CM | POA: Diagnosis not present

## 2016-12-27 DIAGNOSIS — R69 Illness, unspecified: Secondary | ICD-10-CM | POA: Diagnosis not present

## 2016-12-27 DIAGNOSIS — K047 Periapical abscess without sinus: Secondary | ICD-10-CM

## 2016-12-27 HISTORY — DX: Periapical abscess without sinus: K04.7

## 2016-12-27 HISTORY — DX: Tachycardia, unspecified: R00.0

## 2016-12-27 MED ORDER — FLUCONAZOLE 150 MG PO TABS: ORAL_TABLET | ORAL | 1 refills | Status: DC

## 2016-12-27 MED ORDER — CEPHALEXIN 500 MG PO CAPS: 500.0000 mg | ORAL_CAPSULE | Freq: Four times a day (QID) | ORAL | 0 refills | Status: DC

## 2016-12-27 NOTE — Assessment & Plan Note (Signed)
Is struggling with worsening fatigue and anhedonia after her dental and knee surgery recently

## 2016-12-27 NOTE — Assessment & Plan Note (Addendum)
Started on Keflex, check labs and check CT of face. This is likely contributing to fatigue, tachycardia and facial pain

## 2016-12-27 NOTE — Assessment & Plan Note (Signed)
Given refill on Diflucan to use

## 2016-12-27 NOTE — Assessment & Plan Note (Signed)
Asymptomatic and noted to have deydration, in pain with infection. Check labs, increase hydration and return in 3 days for vitals check, seek care if worsens
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
## 2016-12-27 NOTE — Patient Instructions (Addendum)
Restart Remeron Call if you would like to proceed with increased Wellbutrin  Insomnia Insomnia is a sleep disorder that makes it difficult to fall asleep or to stay asleep. Insomnia can cause tiredness (fatigue), low energy, difficulty concentrating, mood swings, and poor performance at work or school. There are three different ways to classify insomnia:  Difficulty falling asleep.  Difficulty staying asleep.  Waking up too early in the morning.  Any type of insomnia can be long-term (chronic) or short-term (acute). Both are common. Short-term insomnia usually lasts for three months or less. Chronic insomnia occurs at least three times a week for longer than three months. What are the causes? Insomnia may be caused by another condition, situation, or substance, such as:  Anxiety.  Certain medicines.  Gastroesophageal reflux disease (GERD) or other gastrointestinal conditions.  Asthma or other breathing conditions.  Restless legs syndrome, sleep apnea, or other sleep disorders.  Chronic pain.  Menopause. This may include hot flashes.  Stroke.  Abuse of alcohol, tobacco, or illegal drugs.  Depression.  Caffeine.  Neurological disorders, such as Alzheimer disease.  An overactive thyroid (hyperthyroidism).  The cause of insomnia may not be known. What increases the risk? Risk factors for insomnia include:  Gender. Women are more commonly affected than men.  Age. Insomnia is more common as you get older.  Stress. This may involve your professional or personal life.  Income. Insomnia is more common in people with lower income.  Lack of exercise.  Irregular work schedule or night shifts.  Traveling between different time zones.  What are the signs or symptoms? If you have insomnia, trouble falling asleep or trouble staying asleep is the main symptom. This may lead to other symptoms, such as:  Feeling fatigued.  Feeling nervous about going to sleep.  Not  feeling rested in the morning.  Having trouble concentrating.  Feeling irritable, anxious, or depressed.  How is this treated? Treatment for insomnia depends on the cause. If your insomnia is caused by an underlying condition, treatment will focus on addressing the condition. Treatment may also include:  Medicines to help you sleep.  Counseling or therapy.  Lifestyle adjustments.  Follow these instructions at home:  Take medicines only as directed by your health care provider.  Keep regular sleeping and waking hours. Avoid naps.  Keep a sleep diary to help you and your health care provider figure out what could be causing your insomnia. Include: ? When you sleep. ? When you wake up during the night. ? How well you sleep. ? How rested you feel the next day. ? Any side effects of medicines you are taking. ? What you eat and drink.  Make your bedroom a comfortable place where it is easy to fall asleep: ? Put up shades or special blackout curtains to block light from outside. ? Use a white noise machine to block noise. ? Keep the temperature cool.  Exercise regularly as directed by your health care provider. Avoid exercising right before bedtime.  Use relaxation techniques to manage stress. Ask your health care provider to suggest some techniques that may work well for you. These may include: ? Breathing exercises. ? Routines to release muscle tension. ? Visualizing peaceful scenes.  Cut back on alcohol, caffeinated beverages, and cigarettes, especially close to bedtime. These can disrupt your sleep.  Do not overeat or eat spicy foods right before bedtime. This can lead to digestive discomfort that can make it hard for you to sleep.  Limit screen use  before bedtime. This includes: ? Watching TV. ? Using your smartphone, tablet, and computer.  Stick to a routine. This can help you fall asleep faster. Try to do a quiet activity, brush your teeth, and go to bed at the same  time each night.  Get out of bed if you are still awake after 15 minutes of trying to sleep. Keep the lights down, but try reading or doing a quiet activity. When you feel sleepy, go back to bed.  Make sure that you drive carefully. Avoid driving if you feel very sleepy.  Keep all follow-up appointments as directed by your health care provider. This is important. Contact a health care provider if:  You are tired throughout the day or have trouble in your daily routine due to sleepiness.  You continue to have sleep problems or your sleep problems get worse. Get help right away if:  You have serious thoughts about hurting yourself or someone else. This information is not intended to replace advice given to you by your health care provider. Make sure you discuss any questions you have with your health care provider. Document Released: 06/11/2000 Document Revised: 11/14/2015 Document Reviewed: 03/15/2014 Elsevier Interactive Patient Education  2018 [place] Abscess A dental abscess is a collection of pus in or around a tooth. What are the causes? This condition is caused by a bacterial infection around the root of the tooth that involves the inner part of the tooth (pulp). It may result from:  Severe tooth decay.  Trauma to the tooth that allows bacteria to enter into the pulp, such as a broken or chipped tooth.  Severe gum disease around a tooth.  What are the signs or symptoms? Symptoms of this condition include:  Severe pain in and around the infected tooth.  Swelling and redness around the infected tooth, in the mouth, or in the face.  Tenderness.  Pus drainage.  Bad breath.  Bitter taste in the mouth.  Difficulty swallowing.  Difficulty opening the mouth.  Nausea.  Vomiting.  Chills.  Swollen neck glands.  Fever.  How is this diagnosed? This condition is diagnosed with examination of the infected tooth. During the exam, your dentist may tap  on the infected tooth. Your dentist will also ask about your medical and dental history and may order X-rays. How is this treated? This condition is treated by eliminating the infection. This may be done with:  Antibiotic medicine.  A root canal. This may be performed to save the tooth.  Pulling (extracting) the tooth. This may also involve draining the abscess. This is done if the tooth cannot be saved.  Follow these instructions at home:  Take medicines only as directed by your dentist.  If you were prescribed antibiotic medicine, finish all of it even if you start to feel better.  Rinse your mouth (gargle) often with salt water to relieve pain or swelling.  Do not drive or operate heavy machinery while taking pain medicine.  Do not apply heat to the outside of your mouth.  Keep all follow-up visits as directed by your dentist. This is important. Contact a health care provider if:  Your pain is worse and is not helped by medicine. Get help right away if:  You have a fever or chills.  Your symptoms suddenly get worse.  You have a very bad headache.  You have problems breathing or swallowing.  You have trouble opening your mouth.  You have swelling in your neck or  around your eye. This information is not intended to replace advice given to you by your health care provider. Make sure you discuss any questions you have with your health care provider. Document Released: 06/14/2005 Document Revised: 10/23/2015 Document Reviewed: 06/11/2014 Elsevier Interactive Patient Education  2017 [employer].

## 2016-12-28 LAB — COMPREHENSIVE METABOLIC PANEL
ALT: 28 U/L (ref 0–35)
AST: 25 U/L (ref 0–37)
Albumin: 3.9 g/dL (ref 3.5–5.2)
Alkaline Phosphatase: 85 U/L (ref 39–117)
BUN: 23 mg/dL (ref 6–23)
CO2: 30 mEq/L (ref 19–32)
Calcium: 9.1 mg/dL (ref 8.4–10.5)
Chloride: 104 mEq/L (ref 96–112)
Creatinine, Ser: 1.29 mg/dL — ABNORMAL HIGH (ref 0.40–1.20)
GFR: 44.84 mL/min — ABNORMAL LOW (ref >60.00)
Glucose, Bld: 82 mg/dL (ref 70–99)
Potassium: 4.3 mEq/L (ref 3.5–5.1)
Sodium: 141 mEq/L (ref 135–145)
Total Bilirubin: 0.3 mg/dL (ref 0.2–1.2)
Total Protein: 6.6 g/dL (ref 6.0–8.3)

## 2016-12-28 LAB — CBC WITH DIFFERENTIAL/PLATELET
Basophils Absolute: 0 10*3/uL (ref 0.0–0.1)
Basophils Relative: 0.4 % (ref 0.0–3.0)
Eosinophils Absolute: 0.1 10*3/uL (ref 0.0–0.7)
Eosinophils Relative: 1.2 % (ref 0.0–5.0)
HCT: 44.9 % (ref 36.0–46.0)
Hemoglobin: 15 g/dL (ref 12.0–15.0)
Lymphocytes Relative: 18.9 % (ref 12.0–46.0)
Lymphs Abs: 1.3 10*3/uL (ref 0.7–4.0)
MCHC: 33.4 g/dL (ref 30.0–36.0)
MCV: 96.8 fl (ref 78.0–100.0)
Monocytes Absolute: 0.7 10*3/uL (ref 0.1–1.0)
Monocytes Relative: 10.7 % (ref 3.0–12.0)
Neutro Abs: 4.6 10*3/uL (ref 1.4–7.7)
Neutrophils Relative %: 68.8 % (ref 43.0–77.0)
Platelets: 301 10*3/uL (ref 150.0–400.0)
RBC: 4.63 Mil/uL (ref 3.87–5.11)
RDW: 14.5 % (ref 11.5–15.5)
WBC: 6.7 10*3/uL (ref 4.0–10.5)

## 2016-12-28 LAB — MAGNESIUM: Magnesium: 2 mg/dL (ref 1.5–2.5)

## 2016-12-28 LAB — SEDIMENTATION RATE: Sed Rate: 3 mm/hr (ref 0–30)

## 2016-12-30 ENCOUNTER — Ambulatory Visit (HOSPITAL_BASED_OUTPATIENT_CLINIC_OR_DEPARTMENT_OTHER)
Admission: RE | Admit: 2016-12-30 | Discharge: 2016-12-30 | Disposition: A | Discharge status: Home / Self Care | Payer: Medicare HMO | Source: Ambulatory Visit | Attending: Family Medicine | Admitting: Family Medicine

## 2016-12-30 ENCOUNTER — Ambulatory Visit (INDEPENDENT_AMBULATORY_CARE_PROVIDER_SITE_OTHER): Payer: Medicare HMO | Admitting: Family Medicine

## 2016-12-30 VITALS — BP 136/76 | HR 117

## 2016-12-30 DIAGNOSIS — Z7409 Other reduced mobility: Secondary | ICD-10-CM | POA: Diagnosis not present

## 2016-12-30 DIAGNOSIS — K047 Periapical abscess without sinus: Secondary | ICD-10-CM | POA: Diagnosis not present

## 2016-12-30 DIAGNOSIS — R000 Tachycardia, unspecified: Secondary | ICD-10-CM

## 2016-12-30 DIAGNOSIS — R51 Headache: Secondary | ICD-10-CM | POA: Diagnosis not present

## 2016-12-30 MED ORDER — METOPROLOL SUCCINATE ER 25 MG PO TB24: 25.0000 mg | ORAL_TABLET | Freq: Every day | ORAL | 1 refills | Status: DC

## 2016-12-30 NOTE — Progress Notes (Signed)
RN note reviewed. Agree with documention and plan. 

## 2016-12-30 NOTE — Progress Notes (Signed)
Pre visit review using our clinic review tool, if applicable. No additional management support is needed unless otherwise documented below in the visit note.  Patient presents in office for blood pressure & pulse check per OV note 12/27/16. Currently, she does not take any medications for blood pressure. Patient reports a 5/10 pain (0-10 pain scale) level r/t dental infection. RN obtained the following readings during today's visit: BP 136/76 P 117 & 02 98%.  Per Dr. Charlett Blake: START Metoprolol Succinate (Toprol-XL) 25 MG once daily. Return in 1 week for a nurse visit to have blood pressure rechecked.  Patient was made aware of the provider's instructions & voiced understanding. Next appointment scheduled for 01/06/17 at 10:45 AM. Nurse blood pressure check note reviewed. Agree with documention and plan.

## 2016-12-30 NOTE — Patient Instructions (Addendum)
Per Dr. Charlett Blake: START Metoprolol Succinate (Toprol-XL) 25 MG once daily. Return in 1 week for a nurse visit to have blood pressure/pulse rechecked.

## 2016-12-30 NOTE — Progress Notes (Signed)
RN blood pressure check note reviewed. Agree with documention and plan. 

## 2016-12-31 DIAGNOSIS — M1711 Unilateral primary osteoarthritis, right knee: Secondary | ICD-10-CM | POA: Diagnosis not present

## 2016-12-31 NOTE — Telephone Encounter (Signed)
rx printed and sent to pharmacy.   PC

## 2016-12-31 NOTE — Addendum Note (Signed)
Addended by: [name] A on: 12/31/2016 03:55 PM   Modules accepted: Orders

## 2017-01-03 ENCOUNTER — Telehealth: Payer: Self-pay | Admitting: *Deleted

## 2017-01-03 NOTE — Telephone Encounter (Signed)
Received request for Medical Records from [facility] documentation of injury onset/date; comprehensive musculoskeletal w/o any specific dates], forwarded to [place] for email/scana/SLS 07/09

## 2017-01-06 ENCOUNTER — Ambulatory Visit (INDEPENDENT_AMBULATORY_CARE_PROVIDER_SITE_OTHER): Payer: Medicare HMO | Admitting: Family Medicine

## 2017-01-06 DIAGNOSIS — R000 Tachycardia, unspecified: Secondary | ICD-10-CM | POA: Diagnosis not present

## 2017-01-06 NOTE — Progress Notes (Signed)
Nurseblood pressure check note reviewed. Agree with documention and plan. 

## 2017-01-06 NOTE — Patient Instructions (Signed)
Per Dr. Charlett Blake patient to start  Metoprolol 25 mg daily (ordered but patient never started) and return in 2 weeks for re check of pulse.Marland Kitchen

## 2017-01-06 NOTE — Progress Notes (Signed)
Pre visit review using our clinic tool,if applicable. No additional management support is needed unless otherwise documented below in the visit note.   Patient in for BP check per order from Dr. Charlett Blake dated 12/30/2016.  Patient complains of knee pain at this time.  BP last visit = 136/76 P= 117  BP today =116/64 P=106  Per Dr. Charlett Blake patient to start  Metoprolol 25 mg daily (ordered but patient never started) and return in 2 weeks for re check of pulse..   Patient states she had damage to her heart from Chemotherapy which left her with a Congenital Anamily. Patient states she feels Starting the Metoprolol would also lower her BP . Advsed patient that I would forward this information to Dr. Charlett Blake and call her back with her decision.   Patient did schedule appointment for 2 weeks.  Nurse blood pressure check note reviewed. Agree with documention and plan.

## 2017-01-17 ENCOUNTER — Other Ambulatory Visit: Payer: Self-pay

## 2017-01-20 ENCOUNTER — Ambulatory Visit (INDEPENDENT_AMBULATORY_CARE_PROVIDER_SITE_OTHER): Payer: Medicare HMO | Admitting: Family Medicine

## 2017-01-20 VITALS — BP 140/82 | HR 94

## 2017-01-20 DIAGNOSIS — I10 Essential (primary) hypertension: Secondary | ICD-10-CM

## 2017-01-20 DIAGNOSIS — E860 Dehydration: Secondary | ICD-10-CM | POA: Diagnosis not present

## 2017-01-20 LAB — COMPREHENSIVE METABOLIC PANEL
ALT: 28 U/L (ref 0–35)
AST: 25 U/L (ref 0–37)
Albumin: 3.7 g/dL (ref 3.5–5.2)
Alkaline Phosphatase: 88 U/L (ref 39–117)
BUN: 17 mg/dL (ref 6–23)
CO2: 26 mEq/L (ref 19–32)
Calcium: 8.9 mg/dL (ref 8.4–10.5)
Chloride: 105 mEq/L (ref 96–112)
Creatinine, Ser: 0.98 mg/dL (ref 0.40–1.20)
GFR: 61.56 mL/min (ref >60.00)
Glucose, Bld: 105 mg/dL — ABNORMAL HIGH (ref 70–99)
Potassium: 4 mEq/L (ref 3.5–5.1)
Sodium: 138 mEq/L (ref 135–145)
Total Bilirubin: 0.5 mg/dL (ref 0.2–1.2)
Total Protein: 6.5 g/dL (ref 6.0–8.3)

## 2017-01-20 NOTE — Progress Notes (Signed)
Pre visit review using our clinic tool,if applicable. No additional management support is needed unless otherwise documented below in the visit note.   Patient in for  BP check per order from Dr, Charlett Blake dated 01/06/17.  Patient states she did not start Metoprolol as ordered just increased her fluid intake. Has had fall at home since last visit. Hit her knee but was not seen. States she will be seeing Ortho regarding this.   Per Dr. Carollee Herter covering for Dr. Charlett Blake patient to take 1/2 (12.5 mg )of Metoprolol 25 mg daily and return for appointment with Dr. Charlett Blake. Patient has appointment with Dr. Charlett Blake scheduled for 02/07/17.

## 2017-01-20 NOTE — Progress Notes (Signed)
Reviewed  Cordia Miklos R Lowne Chase, DO  

## 2017-01-25 ENCOUNTER — Telehealth: Payer: Self-pay | Admitting: Family Medicine

## 2017-01-25 ENCOUNTER — Ambulatory Visit (INDEPENDENT_AMBULATORY_CARE_PROVIDER_SITE_OTHER): Payer: Medicare HMO | Admitting: Physician Assistant

## 2017-01-25 ENCOUNTER — Ambulatory Visit (HOSPITAL_BASED_OUTPATIENT_CLINIC_OR_DEPARTMENT_OTHER)
Admission: RE | Admit: 2017-01-25 | Discharge: 2017-01-25 | Disposition: A | Discharge status: Home / Self Care | Payer: Medicare HMO | Source: Ambulatory Visit | Attending: Physician Assistant | Admitting: Physician Assistant

## 2017-01-25 ENCOUNTER — Encounter: Payer: Self-pay | Admitting: Physician Assistant

## 2017-01-25 VITALS — BP 122/80 | HR 120 | Temp 98.1°F | Resp 16 | Ht 66.0 in | Wt 190.0 lb

## 2017-01-25 DIAGNOSIS — B356 Tinea cruris: Secondary | ICD-10-CM

## 2017-01-25 DIAGNOSIS — R221 Localized swelling, mass and lump, neck: Secondary | ICD-10-CM

## 2017-01-25 DIAGNOSIS — R220 Localized swelling, mass and lump, head: Secondary | ICD-10-CM

## 2017-01-25 DIAGNOSIS — R000 Tachycardia, unspecified: Secondary | ICD-10-CM

## 2017-01-25 DIAGNOSIS — M278 Other specified diseases of jaws: Secondary | ICD-10-CM | POA: Diagnosis not present

## 2017-01-25 MED ORDER — NYSTATIN 100000 UNIT/GM EX POWD: Freq: Three times a day (TID) | CUTANEOUS | 0 refills | Status: DC

## 2017-01-25 NOTE — Patient Instructions (Signed)
Please go to the lab for blood work. I will call you with your results.  Keep well-hydrated. Limit caffeine.  I am having you get an Ultrasound of the neck to further assess area of concern. I am concerned about a blocked sweat gland versus instead of a lymph node based on shape and feel of area of concern. Start some sour candy or sugar-free gum to help stimulate salivary production.  Make sure to chew slowly to prevent getting choked on food. Again if this is not quickly resolved with continued changes, we need to get you back in with Gastroenterology for assessment, especially giving your history of esophageal strictures.   Please keep skin clean and dry.  Continue the Lotrisone cream. Use the powders I have given as directed. If not resolving, give me a call and we will consider an oral diflucan.  Lastly, please take the Metoprolol 1/2 tablet as directed to help with heart rate. You are tired because your heart is beating like you are speed walking on a treadmill. This needs to be lowered. Take as directed and keep close eye on BP and heart rate. If getting low, please stop medication and call primary care.  Follow-up with Dr. Charlett Blake or one of her colleagues within 1 week.

## 2017-01-25 NOTE — Telephone Encounter (Signed)
Patient Name: [name]  DOB: [birth year]    Initial Comment Caller states, needing an apt. - no dr. available- sore throat, lymph node swelling with hardness, and low grade fever. Been fatigued for months. On antibiotics for two months, fungus growing at her groin.    Nurse Assessment  Nurse: Raphael Gibney, RN, Vanita Ingles Date/Time (Eastern Time): 01/25/2017 9:58:20 AM  Confirm and document reason for call. If symptomatic, describe symptoms. ---Caller states she has a sore throat and she has swelling in the lymph nodes on the right side of her neck and they are hard. Has had low grade fever on and off with chills. Neck hurts. Symptoms started on Sunday. She has noticed some difficulty swallowing for 2 weeks.  Does the patient have any new or worsening symptoms? ---Yes  Will a triage be completed? ---Yes  Related visit to physician within the last 2 weeks? ---No  Does the PT have any chronic conditions? (i.e. diabetes, asthma, etc.) ---Yes  List chronic conditions. ---history of cancer; cardiomyeopathy; CHF  Is this a behavioral health or substance abuse call? ---No     Guidelines    Guideline Title Affirmed Question Affirmed Notes  Sore Throat Earache also present    Final Disposition User   See Physician within 24 Hours [place], RN, Vanita Ingles    Comments  No appts available at [place]. appt scheduled for 01/25/2017 at 2:45 pm with Elyn Aquas at [facility].   Referrals  REFERRED TO PCP OFFICE   Disagree/Comply: Comply

## 2017-01-25 NOTE — Progress Notes (Signed)
Pre visit review using our clinic review tool, if applicable. No additional management support is needed unless otherwise documented below in the visit note. 
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
## 2017-01-26 ENCOUNTER — Other Ambulatory Visit: Payer: Self-pay | Admitting: Physician Assistant

## 2017-01-26 DIAGNOSIS — J029 Acute pharyngitis, unspecified: Secondary | ICD-10-CM

## 2017-01-26 LAB — CBC WITH DIFFERENTIAL/PLATELET
Basophils Absolute: 88 cells/uL (ref 0–200)
Basophils Relative: 1 %
Eosinophils Absolute: 88 cells/uL (ref 15–500)
Eosinophils Relative: 1 %
HCT: 45.8 % — ABNORMAL HIGH (ref 35.0–45.0)
Hemoglobin: 15.4 g/dL (ref 11.7–15.5)
Lymphocytes Relative: 24 %
Lymphs Abs: 2112 cells/uL (ref 850–3900)
MCH: 32 pg (ref 27.0–33.0)
MCHC: 33.6 g/dL (ref 32.0–36.0)
MCV: 95 fL (ref 80.0–100.0)
MPV: 9.4 fL (ref 7.5–12.5)
Monocytes Absolute: 704 cells/uL (ref 200–950)
Monocytes Relative: 8 %
Neutro Abs: 5808 cells/uL (ref 1500–7800)
Neutrophils Relative %: 66 %
Platelets: 344 10*3/uL (ref 140–400)
RBC: 4.82 MIL/uL (ref 3.80–5.10)
RDW: 13.9 % (ref 11.0–15.0)
WBC: 8.8 10*3/uL (ref 3.8–10.8)

## 2017-01-28 DIAGNOSIS — M25561 Pain in right knee: Secondary | ICD-10-CM | POA: Diagnosis not present

## 2017-01-28 DIAGNOSIS — S83281D Other tear of lateral meniscus, current injury, right knee, subsequent encounter: Secondary | ICD-10-CM | POA: Diagnosis not present

## 2017-01-28 DIAGNOSIS — Z4789 Encounter for other orthopedic aftercare: Secondary | ICD-10-CM | POA: Diagnosis not present

## 2017-02-01 ENCOUNTER — Telehealth: Payer: Self-pay | Admitting: Physician Assistant

## 2017-02-01 ENCOUNTER — Encounter: Payer: Self-pay | Admitting: Emergency Medicine

## 2017-02-01 DIAGNOSIS — K1123 Chronic sialoadenitis: Secondary | ICD-10-CM | POA: Diagnosis not present

## 2017-02-01 MED ORDER — FLUCONAZOLE 150 MG PO TABS: 150.0000 mg | ORAL_TABLET | Freq: Once | ORAL | 0 refills | Status: AC

## 2017-02-01 NOTE — Telephone Encounter (Signed)
Patient called stating that she has had no improvements with the antifungal powder but was advised to call the office back and she could be given some Diflucan. Patient uses the [facility] on [address].

## 2017-02-01 NOTE — Telephone Encounter (Signed)
Rx sent. Hold Lipitor for a couple of days when taking this medication.

## 2017-02-01 NOTE — Telephone Encounter (Signed)
Called patient but voicemail not set up. Sent a My chart message.

## 2017-02-01 NOTE — Telephone Encounter (Signed)
Per last visit if sxs has not resolved to try oral diflucan. Please advise

## 2017-02-07 ENCOUNTER — Encounter: Payer: Self-pay | Admitting: Family Medicine

## 2017-02-07 ENCOUNTER — Ambulatory Visit (INDEPENDENT_AMBULATORY_CARE_PROVIDER_SITE_OTHER): Payer: Medicare HMO | Admitting: Family Medicine

## 2017-02-07 VITALS — BP 120/70 | HR 84 | Temp 98.2°F | Resp 18 | Wt 192.6 lb

## 2017-02-07 DIAGNOSIS — R0989 Other specified symptoms and signs involving the circulatory and respiratory systems: Secondary | ICD-10-CM

## 2017-02-07 DIAGNOSIS — R221 Localized swelling, mass and lump, neck: Secondary | ICD-10-CM

## 2017-02-07 DIAGNOSIS — M542 Cervicalgia: Secondary | ICD-10-CM | POA: Diagnosis not present

## 2017-02-07 DIAGNOSIS — F4323 Adjustment disorder with mixed anxiety and depressed mood: Secondary | ICD-10-CM

## 2017-02-07 DIAGNOSIS — K5900 Constipation, unspecified: Secondary | ICD-10-CM | POA: Diagnosis not present

## 2017-02-07 DIAGNOSIS — R69 Illness, unspecified: Secondary | ICD-10-CM | POA: Diagnosis not present

## 2017-02-07 DIAGNOSIS — R1310 Dysphagia, unspecified: Secondary | ICD-10-CM

## 2017-02-07 HISTORY — DX: Dysphagia, unspecified: R13.10

## 2017-02-07 HISTORY — DX: Other specified symptoms and signs involving the circulatory and respiratory systems: R09.89

## 2017-02-07 MED ORDER — FLUCONAZOLE 150 MG PO TABS: 150.0000 mg | ORAL_TABLET | ORAL | 0 refills | Status: DC

## 2017-02-07 MED ORDER — METOPROLOL SUCCINATE ER 25 MG PO TB24: 12.5000 mg | ORAL_TABLET | Freq: Every day | ORAL | 1 refills | Status: DC | PRN

## 2017-02-07 NOTE — Assessment & Plan Note (Signed)
Has had to have her esophagus stretched in past with good results. Is having more trouble again.  Referred to gastroenterology for further evaluation. Avoid offending foods.

## 2017-02-07 NOTE — Assessment & Plan Note (Signed)
Right SCM spasmed. Encouraged moist heat and gentle stretching as tolerated. May try NSAIDs and prescription meds as directed and report if symptoms worsen or seek immediate care. Consider massage therapy

## 2017-02-07 NOTE — Assessment & Plan Note (Signed)
She and her husband have just started marriage counseling and she is not optimistic but is willing to try

## 2017-02-07 NOTE — Assessment & Plan Note (Signed)
Encouraged increased hydration and fiber in diet. Daily probiotics. If bowels not moving can use MOM 2 tbls po in 4 oz of warm prune juice by mouth every 2-3 days. If no results then repeat in 4 hours with  Dulcolax suppository pr, may repeat again in 4 more hours as needed. Seek care if symptoms worsen. Consider daily Miralax and/or Dulcolax if symptoms persist.  

## 2017-02-07 NOTE — Assessment & Plan Note (Signed)
Proceed with carotid dopplers
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
## 2017-02-07 NOTE — Patient Instructions (Signed)

## 2017-02-08 ENCOUNTER — Ambulatory Visit
Admission: RE | Admit: 2017-02-08 | Discharge: 2017-02-08 | Disposition: A | Discharge status: Home / Self Care | Payer: Medicare HMO | Source: Ambulatory Visit | Attending: Family Medicine | Admitting: Family Medicine

## 2017-02-08 DIAGNOSIS — R0989 Other specified symptoms and signs involving the circulatory and respiratory systems: Secondary | ICD-10-CM

## 2017-02-08 DIAGNOSIS — I6523 Occlusion and stenosis of bilateral carotid arteries: Secondary | ICD-10-CM | POA: Diagnosis not present

## 2017-02-09 ENCOUNTER — Encounter: Payer: Self-pay | Admitting: Family Medicine

## 2017-02-10 ENCOUNTER — Other Ambulatory Visit: Payer: Self-pay | Admitting: Gastroenterology

## 2017-02-10 DIAGNOSIS — R1310 Dysphagia, unspecified: Secondary | ICD-10-CM

## 2017-02-10 DIAGNOSIS — R1311 Dysphagia, oral phase: Secondary | ICD-10-CM | POA: Diagnosis not present

## 2017-02-14 ENCOUNTER — Ambulatory Visit
Admission: RE | Admit: 2017-02-14 | Discharge: 2017-02-14 | Disposition: A | Discharge status: Home / Self Care | Payer: Medicare HMO | Source: Ambulatory Visit | Attending: Gastroenterology | Admitting: Gastroenterology

## 2017-02-14 DIAGNOSIS — R1310 Dysphagia, unspecified: Secondary | ICD-10-CM

## 2017-02-17 ENCOUNTER — Other Ambulatory Visit: Payer: Self-pay | Admitting: Oncology

## 2017-02-21 DIAGNOSIS — M1711 Unilateral primary osteoarthritis, right knee: Secondary | ICD-10-CM | POA: Diagnosis not present

## 2017-02-23 DIAGNOSIS — K449 Diaphragmatic hernia without obstruction or gangrene: Secondary | ICD-10-CM | POA: Diagnosis not present

## 2017-02-23 DIAGNOSIS — K222 Esophageal obstruction: Secondary | ICD-10-CM | POA: Diagnosis not present

## 2017-02-23 DIAGNOSIS — R933 Abnormal findings on diagnostic imaging of other parts of digestive tract: Secondary | ICD-10-CM | POA: Diagnosis not present

## 2017-02-23 DIAGNOSIS — R1311 Dysphagia, oral phase: Secondary | ICD-10-CM | POA: Diagnosis not present

## 2017-02-23 LAB — HM COLONOSCOPY

## 2017-02-28 IMAGING — DX DG CHEST 2V
2 series · 2 of 2 positions shown · non-contrast
Comparison: 02/11/2016

CLINICAL DATA: Chest congestion with wheezing

EXAM:
CHEST  2 VIEW

[chest pa]
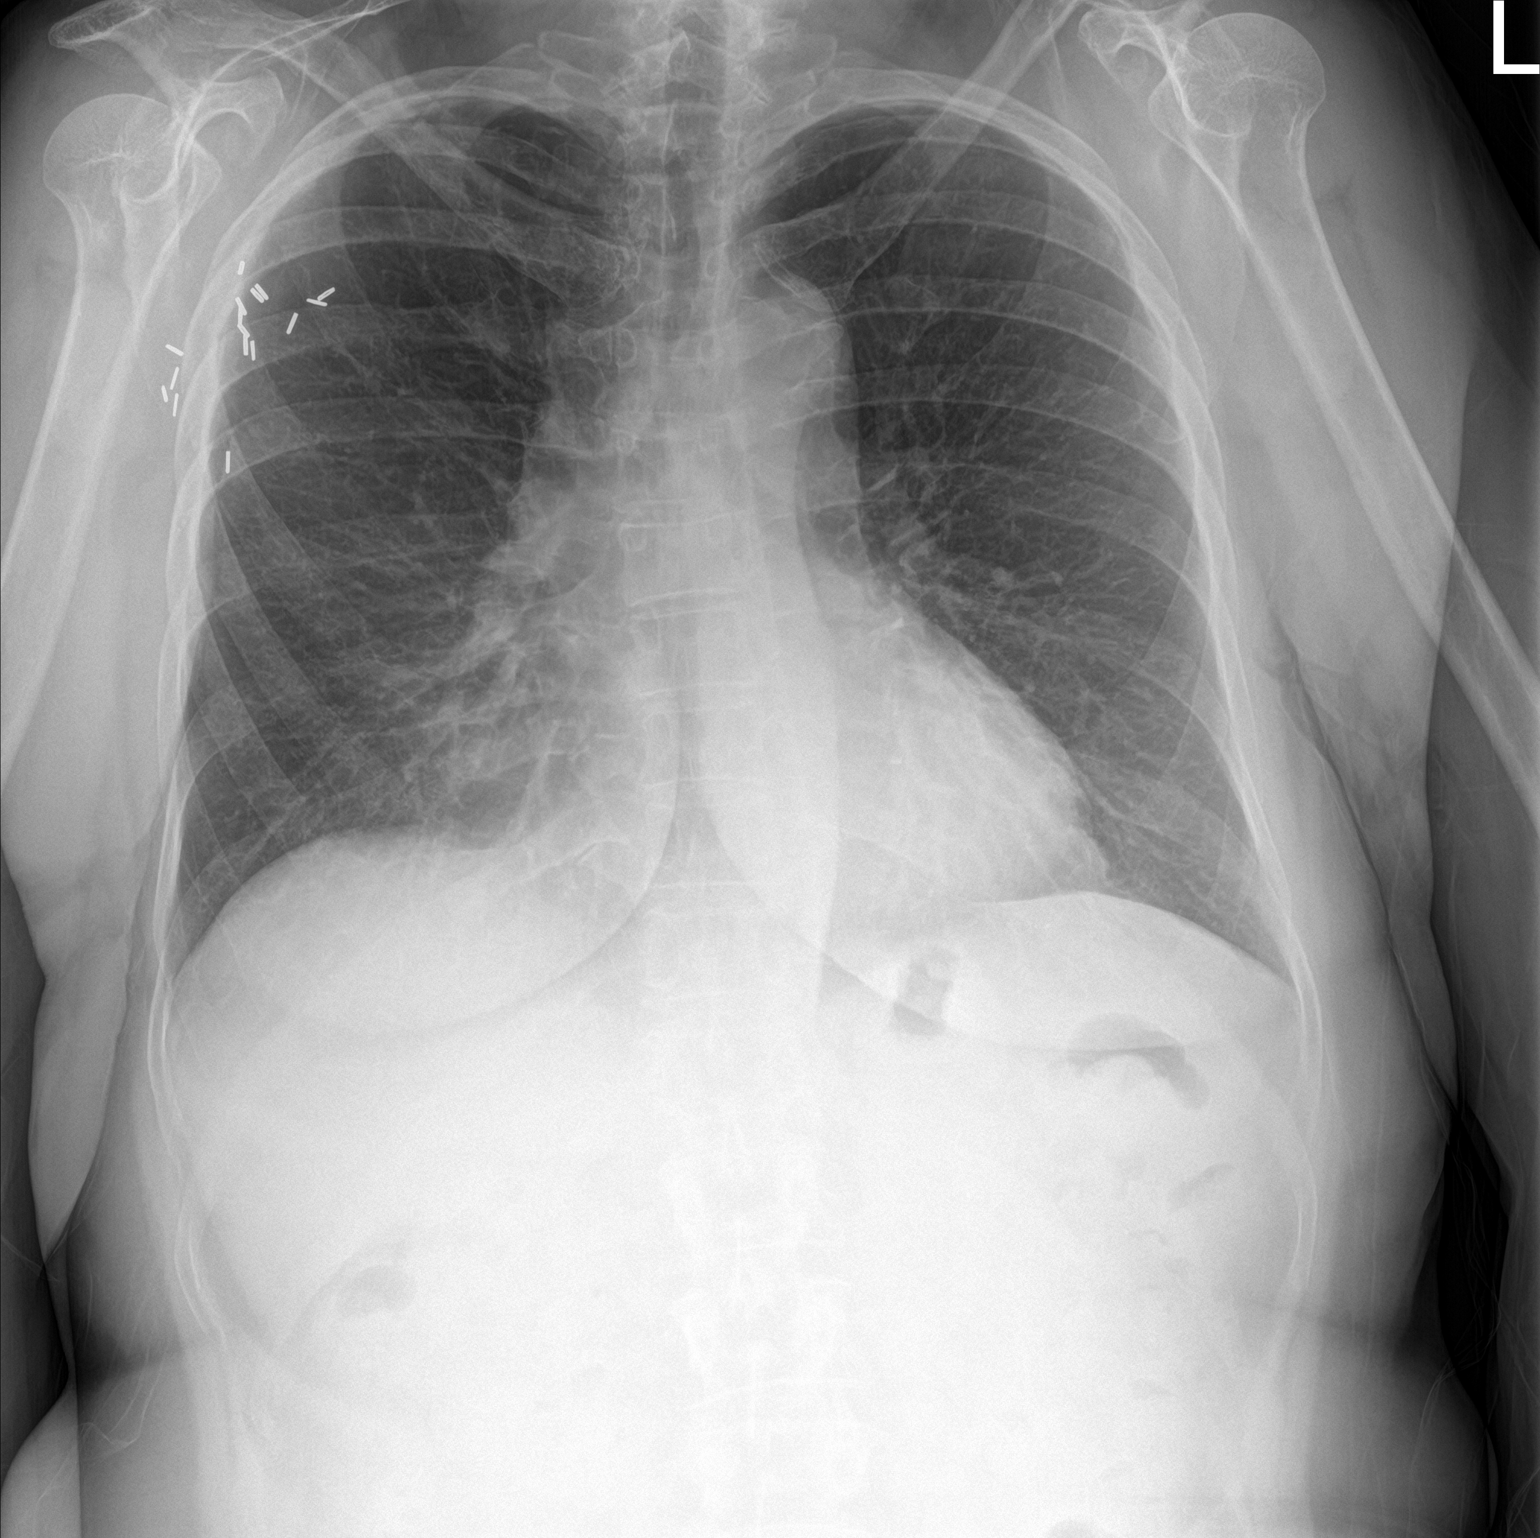

[chest lat]
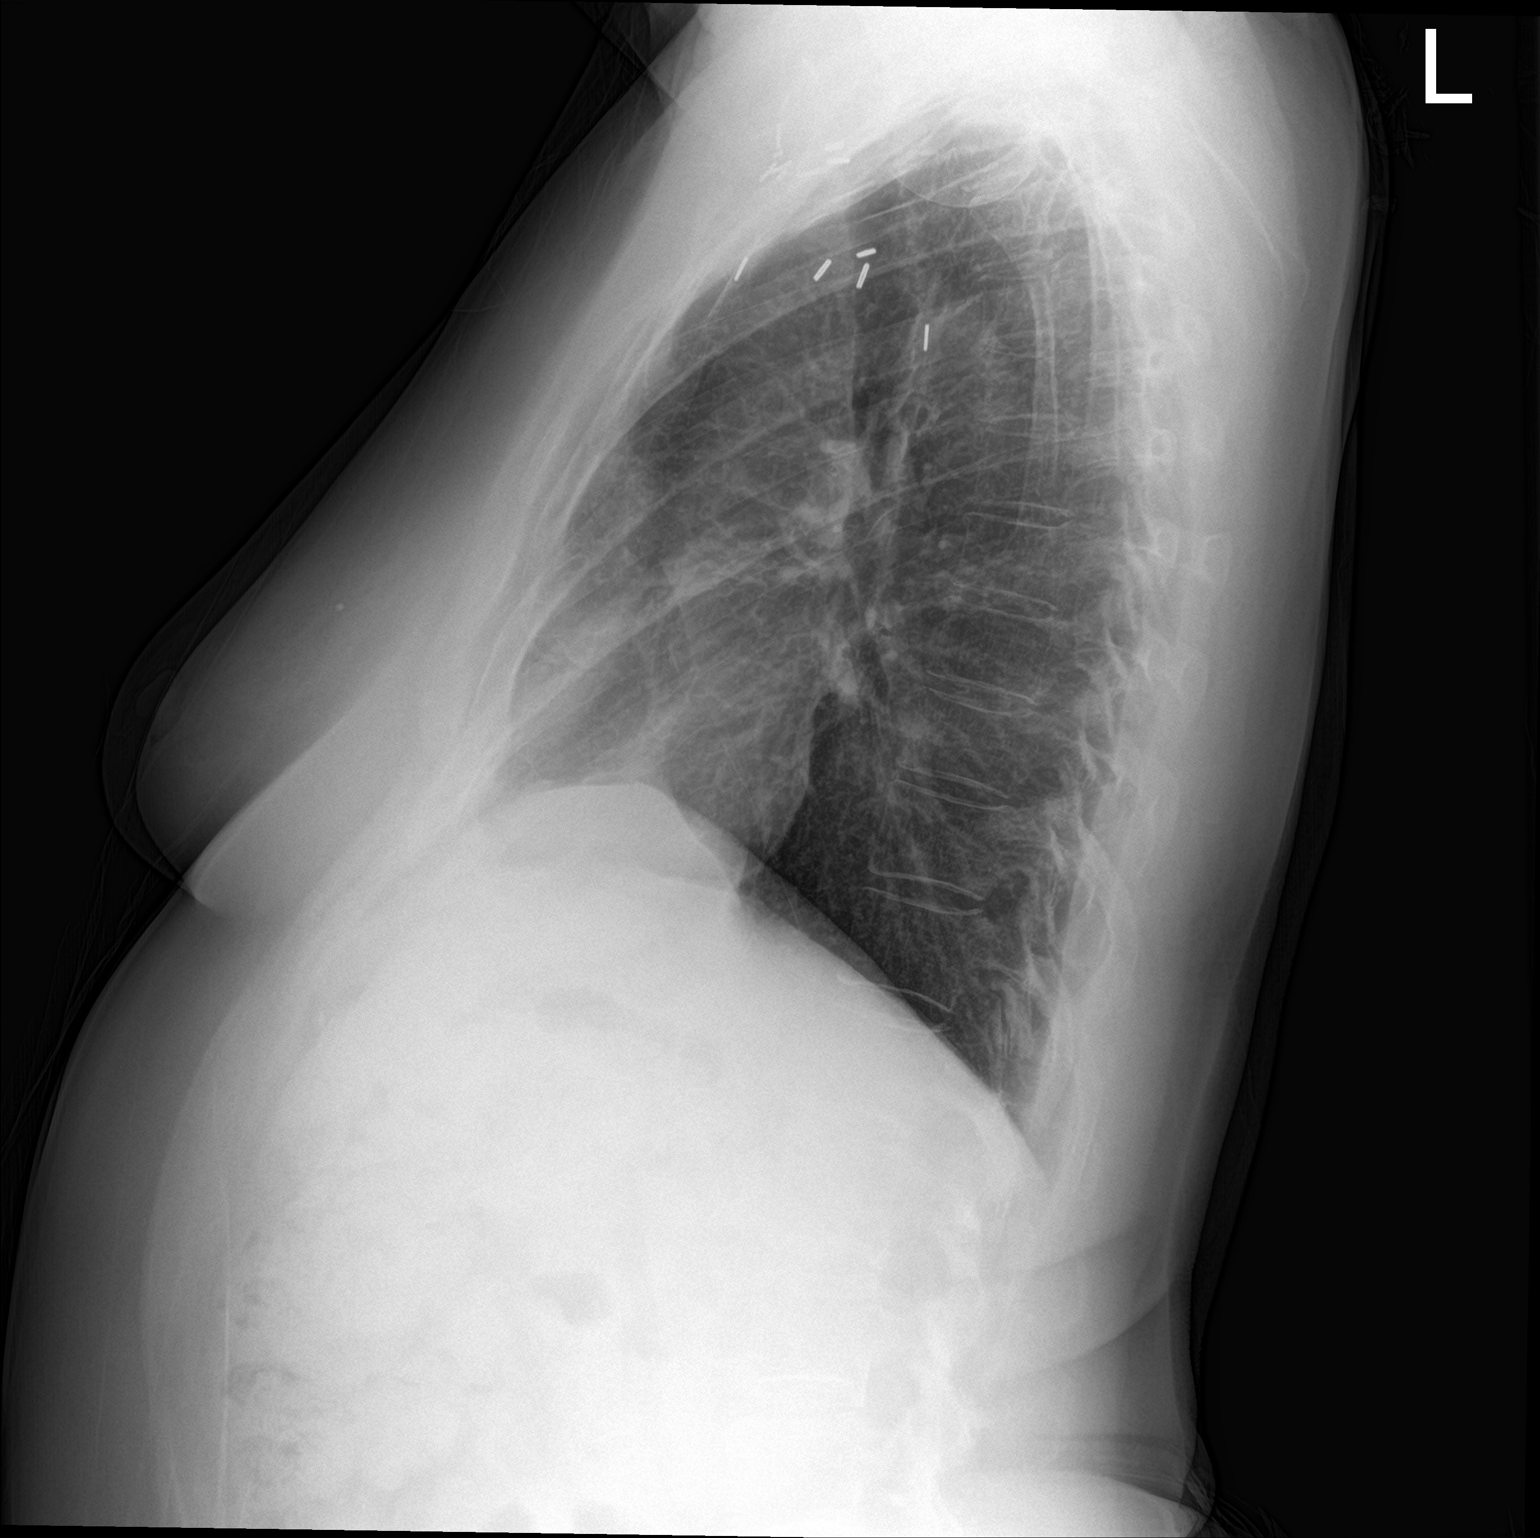

[2 of 2 positions shown; findings below may reference images not displayed]

FINDINGS: Normal heart size and mediastinal contours. Apparent right middle
lobe opacity with obscured right heart border is from pectus
excavatum, stable. Right axillary dissection. There is no edema,
consolidation, effusion, or pneumothorax.
IMPRESSION: Stable.  No evidence of acute disease.

## 2017-03-02 ENCOUNTER — Other Ambulatory Visit: Payer: Self-pay

## 2017-03-02 ENCOUNTER — Encounter: Payer: Self-pay | Admitting: Family Medicine

## 2017-03-02 ENCOUNTER — Other Ambulatory Visit: Payer: Self-pay | Admitting: Family Medicine

## 2017-03-02 DIAGNOSIS — M1711 Unilateral primary osteoarthritis, right knee: Secondary | ICD-10-CM | POA: Diagnosis not present

## 2017-03-02 MED ORDER — BUPROPION HCL ER (XL) 300 MG PO TB24: 300.0000 mg | ORAL_TABLET | Freq: Every day | ORAL | 1 refills | Status: DC

## 2017-03-03 ENCOUNTER — Other Ambulatory Visit: Payer: Self-pay | Admitting: Family Medicine

## 2017-03-03 MED ORDER — BUPROPION HCL ER (XL) 150 MG PO TB24: 450.0000 mg | ORAL_TABLET | Freq: Every day | ORAL | 2 refills | Status: DC

## 2017-03-03 NOTE — Telephone Encounter (Signed)
Mirtazapine was Keylon Labelle/C'ed 7.2018/thx dmf

## 2017-03-04 ENCOUNTER — Other Ambulatory Visit: Payer: Self-pay

## 2017-03-04 MED ORDER — BUPROPION HCL ER (XL) 300 MG PO TB24: 300.0000 mg | ORAL_TABLET | Freq: Every day | ORAL | 0 refills | Status: DC

## 2017-03-04 MED ORDER — BUPROPION HCL ER (XL) 150 MG PO TB24: 150.0000 mg | ORAL_TABLET | Freq: Every day | ORAL | 0 refills | Status: DC

## 2017-03-04 NOTE — Telephone Encounter (Signed)
Wellbutrin XL 150mg  3qd not covered/separated into 2 Rx's to equal same amount as follows Wellbutrin XL 150mg  1qd Wellbutrin XL 300mg  1qd Faxed 30d of each to equal 450mg  total as this is goal per provider/thx dmf

## 2017-03-08 ENCOUNTER — Telehealth: Payer: Self-pay | Admitting: Family Medicine

## 2017-03-08 DIAGNOSIS — N949 Unspecified condition associated with female genital organs and menstrual cycle: Secondary | ICD-10-CM

## 2017-03-08 NOTE — Telephone Encounter (Signed)
There is really not much else for yeast. Have her come in for urine ancillary testing to check for candida and bacterial vaginosis so I can clarify which cause and pick the appropriate treatment. If neither is present will place her on a a topical steroid.

## 2017-03-08 NOTE — Telephone Encounter (Signed)
Please advise 

## 2017-03-08 NOTE — Telephone Encounter (Signed)
°  Relation to GM:WNUU Call back [place]: [id number] [place], [place]. [address] [phone] (Phone) [phone] (Fax)     Reason for call:  Patient states fluconazole (DIFLUCAN) 150 MG tablet is not working for her yeast infection, vaginal itchiness has not improved. Patient states she tried nystatin vaginal cream and it didn't help either, patient requesting new Rx,please advis

## 2017-03-09 DIAGNOSIS — M1711 Unilateral primary osteoarthritis, right knee: Secondary | ICD-10-CM | POA: Diagnosis not present

## 2017-03-10 ENCOUNTER — Other Ambulatory Visit: Payer: Medicare HMO

## 2017-03-10 ENCOUNTER — Other Ambulatory Visit (HOSPITAL_COMMUNITY)
Admission: RE | Admit: 2017-03-10 | Discharge: 2017-03-10 | Disposition: A | Discharge status: Home / Self Care | Payer: Medicare HMO | Source: Ambulatory Visit | Attending: Family Medicine | Admitting: Family Medicine

## 2017-03-10 DIAGNOSIS — N949 Unspecified condition associated with female genital organs and menstrual cycle: Secondary | ICD-10-CM | POA: Diagnosis present

## 2017-03-10 NOTE — Telephone Encounter (Signed)
Patient checking on the status of clinical advice mentioned below, please advise best # [phone]

## 2017-03-10 NOTE — Telephone Encounter (Signed)
Spoke with patient she will come in today for further testing.   PC

## 2017-03-15 ENCOUNTER — Other Ambulatory Visit: Payer: Self-pay | Admitting: Family Medicine

## 2017-03-15 DIAGNOSIS — Z79899 Other long term (current) drug therapy: Secondary | ICD-10-CM

## 2017-03-15 DIAGNOSIS — F418 Other specified anxiety disorders: Secondary | ICD-10-CM

## 2017-03-15 NOTE — Telephone Encounter (Signed)
Requesting: Alprazolam Contract: No UDS: No Last OV: 8.13.18 Next OV: 10.15.18  Last Refill: 12.26.18   Please advise

## 2017-03-15 NOTE — Telephone Encounter (Signed)
Needs UDS and contract but then can have refill

## 2017-03-15 NOTE — Telephone Encounter (Signed)
Sent req to PCP/thx dmf 

## 2017-03-16 DIAGNOSIS — M1711 Unilateral primary osteoarthritis, right knee: Secondary | ICD-10-CM | POA: Diagnosis not present

## 2017-03-16 LAB — URINE CYTOLOGY ANCILLARY ONLY
Bacterial vaginitis: NEGATIVE
Candida vaginitis: NEGATIVE

## 2017-03-16 NOTE — Telephone Encounter (Signed)
Printed Rx for SB to sign on 9.20.18/printed Controlled Substance Contract/created future order for UDS with F41.8 & Z79.899/will call pt when signed to give instructions to come to office to sign contract and submit specimen/thx dmf

## 2017-03-24 ENCOUNTER — Encounter: Payer: Self-pay | Admitting: Family Medicine

## 2017-04-06 DIAGNOSIS — R69 Illness, unspecified: Secondary | ICD-10-CM | POA: Diagnosis not present

## 2017-04-11 ENCOUNTER — Ambulatory Visit: Payer: Medicare HMO | Admitting: Family Medicine

## 2017-04-13 DIAGNOSIS — L820 Inflamed seborrheic keratosis: Secondary | ICD-10-CM | POA: Diagnosis not present

## 2017-04-13 DIAGNOSIS — L304 Erythema intertrigo: Secondary | ICD-10-CM | POA: Diagnosis not present

## 2017-04-13 DIAGNOSIS — L821 Other seborrheic keratosis: Secondary | ICD-10-CM | POA: Diagnosis not present

## 2017-04-13 DIAGNOSIS — L218 Other seborrheic dermatitis: Secondary | ICD-10-CM | POA: Diagnosis not present

## 2017-04-19 ENCOUNTER — Ambulatory Visit (INDEPENDENT_AMBULATORY_CARE_PROVIDER_SITE_OTHER): Payer: Medicare HMO | Admitting: Family Medicine

## 2017-04-19 ENCOUNTER — Encounter: Payer: Self-pay | Admitting: Family Medicine

## 2017-04-19 VITALS — BP 120/64 | HR 96 | Temp 97.8°F | Resp 18 | Wt 187.6 lb

## 2017-04-19 DIAGNOSIS — E039 Hypothyroidism, unspecified: Secondary | ICD-10-CM

## 2017-04-19 DIAGNOSIS — E559 Vitamin D deficiency, unspecified: Secondary | ICD-10-CM

## 2017-04-19 DIAGNOSIS — F4323 Adjustment disorder with mixed anxiety and depressed mood: Secondary | ICD-10-CM

## 2017-04-19 DIAGNOSIS — K5900 Constipation, unspecified: Secondary | ICD-10-CM | POA: Diagnosis not present

## 2017-04-19 DIAGNOSIS — G4700 Insomnia, unspecified: Secondary | ICD-10-CM

## 2017-04-19 DIAGNOSIS — R69 Illness, unspecified: Secondary | ICD-10-CM | POA: Diagnosis not present

## 2017-04-19 DIAGNOSIS — E669 Obesity, unspecified: Secondary | ICD-10-CM | POA: Diagnosis not present

## 2017-04-19 DIAGNOSIS — Z23 Encounter for immunization: Secondary | ICD-10-CM | POA: Diagnosis not present

## 2017-04-19 HISTORY — DX: Obesity, unspecified: E66.9

## 2017-04-19 MED ORDER — BUPROPION HCL ER (XL) 300 MG PO TB24: 300.0000 mg | ORAL_TABLET | Freq: Every day | ORAL | 3 refills | Status: DC

## 2017-04-19 MED ORDER — FLUOXETINE HCL 10 MG PO TABS: 10.0000 mg | ORAL_TABLET | Freq: Every day | ORAL | 3 refills | Status: DC

## 2017-04-19 MED ORDER — LEVOTHYROXINE SODIUM 75 MCG PO TABS: 75.0000 ug | ORAL_TABLET | Freq: Every day | ORAL | 1 refills | Status: DC

## 2017-04-19 MED ORDER — BUPROPION HCL ER (XL) 300 MG PO TB24: 300.0000 mg | ORAL_TABLET | Freq: Every day | ORAL | 0 refills | Status: DC

## 2017-04-19 NOTE — Patient Instructions (Addendum)
Miracle oil on new [place]  Buspar Rash A rash is a change in the color of the skin. A rash can also change the way your skin feels. There are many different conditions and factors that can cause a rash. Follow these instructions at home: Pay attention to any changes in your symptoms. Follow these instructions to help with your condition: Medicine Take or apply over-the-counter and prescription medicines only as told by your doctor. These may include:  Corticosteroid cream.  Anti-itch lotions.  Oral antihistamines.  Skin Care  Put cool compresses on the affected areas.  Try taking a bath with: ? Epsom salts. Follow the instructions on the packaging. You can get these at your local pharmacy or grocery store. ? Baking soda. Pour a small amount into the bath as told by your doctor. ? Colloidal oatmeal. Follow the instructions on the packaging. You can get this at your local pharmacy or grocery store.  Try putting baking soda paste onto your skin. Stir water into baking soda until it gets like a paste.  Do not scratch or rub your skin.  Avoid covering the rash. Make sure the rash is exposed to air as much as possible. General instructions  Avoid hot showers or baths, which can make itching worse. A cold shower may help.  Avoid scented soaps, detergents, and perfumes. Use gentle soaps, detergents, perfumes, and other cosmetic products.  Avoid anything that causes your rash. Keep a journal to help track what causes your rash. Write down: ? What you eat. ? What cosmetic products you use. ? What you drink. ? What you wear. This includes jewelry.  Keep all follow-up visits as told by your doctor. This is important. Contact a doctor if:  You sweat at night.  You lose weight.  You pee (urinate) more than normal.  You feel weak.  You throw up (vomit).  Your skin or the whites of your eyes look yellow (jaundice).  Your skin: ? Tingles. ? Is numb.  Your rash: ? Does not  go away after a few days. ? Gets worse.  You are: ? More thirsty than normal. ? More tired than normal.  You have: ? New symptoms. ? Pain in your belly (abdomen). ? A fever. ? Watery poop (diarrhea). Get help right away if:  Your rash covers all or most of your body. The rash may or may not be painful.  You have blisters that: ? Are on top of the rash. ? Grow larger. ? Grow together. ? Are painful. ? Are inside your nose or mouth.  You have a rash that: ? Looks like purple pinprick-sized spots all over your body. ? Has a "bull's eye" or looks like a target. ? Is red and painful, causes your skin to peel, and is not from being in the sun too long. This information is not intended to replace advice given to you by your health care provider. Make sure you discuss any questions you have with your health care provider. Document Released: 12/01/2007 Document Revised: 11/20/2015 Document Reviewed: 10/30/2014 Elsevier Interactive Patient Education  2018 [employer].

## 2017-04-19 NOTE — Assessment & Plan Note (Signed)
Encouraged DASH diet, decrease po intake and increase exercise as tolerated. Needs 7-8 hours of sleep nightly. Avoid trans fats, eat small, frequent meals every 4-5 hours with lean proteins, complex carbs and healthy fats. Minimize simple carbs, bariatric referral has gone to a trainer and is exercising more

## 2017-04-19 NOTE — Assessment & Plan Note (Signed)
With wellbutrin increase had headaches, dizziness, tachycardia and increased insomnia. Alprazolam has been stopped by patient and she is feeling better
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
## 2017-04-19 NOTE — Assessment & Plan Note (Signed)
Encouraged good sleep hygiene such as dark, quiet room. No blue/green glowing lights such as computer screens in bedroom. No alcohol or stimulants in evening. Cut down on caffeine as able. Regular exercise is helpful but not just prior to bed time. Has been alternating Melatonin and Valerian root.

## 2017-04-20 NOTE — Assessment & Plan Note (Signed)
On Levothyroxine, continue to monitor 

## 2017-04-20 NOTE — Assessment & Plan Note (Signed)
Encouraged daily supplements and monitor

## 2017-04-20 NOTE — Assessment & Plan Note (Signed)
Encouraged increased hydration and fiber in diet. Daily probiotics. If bowels not moving can use MOM 2 tbls po in 4 oz of warm prune juice by mouth every 2-3 days. If no results then repeat in 4 hours with  Dulcolax suppository pr, may repeat again in 4 more hours as needed. Seek care if symptoms worsen. Consider daily Miralax and/or Dulcolax if symptoms persist.  

## 2017-04-21 DIAGNOSIS — Z23 Encounter for immunization: Secondary | ICD-10-CM | POA: Diagnosis not present

## 2017-04-21 DIAGNOSIS — R69 Illness, unspecified: Secondary | ICD-10-CM | POA: Diagnosis not present

## 2017-04-21 DIAGNOSIS — E039 Hypothyroidism, unspecified: Secondary | ICD-10-CM | POA: Diagnosis not present

## 2017-04-21 DIAGNOSIS — E559 Vitamin D deficiency, unspecified: Secondary | ICD-10-CM | POA: Diagnosis not present

## 2017-04-21 DIAGNOSIS — E669 Obesity, unspecified: Secondary | ICD-10-CM | POA: Diagnosis not present

## 2017-04-21 DIAGNOSIS — K5900 Constipation, unspecified: Secondary | ICD-10-CM | POA: Diagnosis not present

## 2017-04-21 DIAGNOSIS — G4700 Insomnia, unspecified: Secondary | ICD-10-CM | POA: Diagnosis not present

## 2017-04-22 ENCOUNTER — Other Ambulatory Visit: Payer: Self-pay | Admitting: Family Medicine

## 2017-04-25 ENCOUNTER — Ambulatory Visit (INDEPENDENT_AMBULATORY_CARE_PROVIDER_SITE_OTHER): Payer: Medicare HMO | Admitting: Family Medicine

## 2017-04-25 ENCOUNTER — Encounter: Payer: Self-pay | Admitting: Family Medicine

## 2017-04-25 VITALS — BP 138/80 | HR 108 | Temp 98.1°F | Resp 16 | Ht 66.0 in | Wt 185.0 lb

## 2017-04-25 DIAGNOSIS — H1012 Acute atopic conjunctivitis, left eye: Secondary | ICD-10-CM | POA: Diagnosis not present

## 2017-04-25 DIAGNOSIS — R079 Chest pain, unspecified: Secondary | ICD-10-CM | POA: Diagnosis not present

## 2017-04-25 DIAGNOSIS — H60541 Acute eczematoid otitis externa, right ear: Secondary | ICD-10-CM

## 2017-04-25 DIAGNOSIS — L299 Pruritus, unspecified: Secondary | ICD-10-CM | POA: Diagnosis not present

## 2017-04-25 DIAGNOSIS — N6459 Other signs and symptoms in breast: Secondary | ICD-10-CM

## 2017-04-25 NOTE — Patient Instructions (Signed)
Nonspecific Chest Pain °Chest pain can be caused by many different conditions. There is always a chance that your pain could be related to something serious, such as a heart attack or a blood clot in your lungs. Chest pain can also be caused by conditions that are not life-threatening. If you have chest pain, it is very important to follow up with your health care provider. °What are the causes? °Causes of this condition include: °· Heartburn. °· Pneumonia or bronchitis. °· Anxiety or stress. °· Inflammation around your heart (pericarditis) or lung (pleuritis or pleurisy). °· A blood clot in your lung. °· A collapsed lung (pneumothorax). This can develop suddenly on its own (spontaneous pneumothorax) or from trauma to the chest. °· Shingles infection (varicella-zoster virus). °· Heart attack. °· Damage to the bones, muscles, and cartilage that make up your chest wall. This can include: °? Bruised bones due to injury. °? Strained muscles or cartilage due to frequent or repeated coughing or overwork. °? Fracture to one or more ribs. °? Sore cartilage due to inflammation (costochondritis). ° °What increases the risk? °Risk factors for this condition may include: °· Activities that increase your risk for trauma or injury to your chest. °· Respiratory infections or conditions that cause frequent coughing. °· Medical conditions or overeating that can cause heartburn. °· Heart disease or family history of heart disease. °· Conditions or health behaviors that increase your risk of developing a blood clot. °· Having had chicken pox (varicella zoster). ° °What are the signs or symptoms? °Chest pain can feel like: °· Burning or tingling on the surface of your chest or deep in your chest. °· Crushing, pressure, aching, or squeezing pain. °· Dull or sharp pain that is worse when you move, cough, or take a deep breath. °· Pain that is also felt in your back, neck, shoulder, or arm, or pain that spreads to any of these  areas. ° °Your chest pain may come and go, or it may stay constant. °How is this diagnosed? °Lab tests or other studies may be needed to find the cause of your pain. Your health care provider may have you take a test called an ECG (electrocardiogram). An ECG records your heartbeat patterns at the time the test is performed. You may also have other tests, such as: °· Transthoracic echocardiogram (TTE). In this test, sound waves are used to create a picture of the heart structures and to look at how blood flows through your heart. °· Transesophageal echocardiogram (TEE). This is a more advanced imaging test that takes images from inside your body. It allows your health care provider to see your heart in finer detail. °· Cardiac monitoring. This allows your health care provider to monitor your heart rate and rhythm in real time. °· Holter monitor. This is a portable device that records your heartbeat and can help to diagnose abnormal heartbeats. It allows your health care provider to track your heart activity for several days, if needed. °· Stress tests. These can be done through exercise or by taking medicine that makes your heart beat more quickly. °· Blood tests. °· Other imaging tests. ° °How is this treated? °Treatment depends on what is causing your chest pain. Treatment may include: °· Medicines. These may include: °? Acid blockers for heartburn. °? Anti-inflammatory medicine. °? Pain medicine for inflammatory conditions. °? Antibiotic medicine, if an infection is present. °? Medicines to dissolve blood clots. °? Medicines to treat coronary artery disease (CAD). °· Supportive care for conditions that   do not require medicines. This may include: °? Resting. °? Applying heat or cold packs to injured areas. °? Limiting activities until pain decreases. ° °Follow these instructions at home: °Medicines °· If you were prescribed an antibiotic, take it as told by your health care provider. Do not stop taking the  antibiotic even if you start to feel better. °· Take over-the-counter and prescription medicines only as told by your health care provider. °Lifestyle °· Do not use any products that contain nicotine or tobacco, such as cigarettes and e-cigarettes. If you need help quitting, ask your health care provider. °· Do not drink alcohol. °· Make lifestyle changes as directed by your health care provider. These may include: °? Getting regular exercise. Ask your health care provider to suggest some activities that are safe for you. °? Eating a heart-healthy diet. A registered dietitian can help you to learn healthy eating options. °? Maintaining a healthy weight. °? Managing diabetes, if necessary. °? Reducing stress, such as with yoga or relaxation techniques. °General instructions °· Avoid any activities that bring on chest pain. °· If heartburn is the cause for your chest pain, raise (elevate) the head of your bed about 6 inches (15 cm) by putting blocks under the legs. Sleeping with more pillows does not effectively relieve heartburn because it only changes the position of your head. °· Keep all follow-up visits as told by your health care provider. This is important. This includes any further testing if your chest pain does not go away. °Contact a health care provider if: °· Your chest pain does not go away. °· You have a rash with blisters on your chest. °· You have a fever. °· You have chills. °Get help right away if: °· Your chest pain is worse. °· You have a cough that gets worse, or you cough up blood. °· You have severe pain in your abdomen. °· You have severe weakness. °· You faint. °· You have sudden, unexplained chest discomfort. °· You have sudden, unexplained discomfort in your arms, back, neck, or jaw. °· You have shortness of breath at any time. °· You suddenly start to sweat, or your skin gets clammy. °· You feel nauseous or you vomit. °· You suddenly feel light-headed or dizzy. °· Your heart begins to beat  quickly, or it feels like it is skipping beats. °These symptoms may represent a serious problem that is an emergency. Do not wait to see if the symptoms will go away. Get medical help right away. Call your local emergency services (911 in the [place] Do not drive yourself to the hospital. °This information is not intended to replace advice given to you by your health care provider. Make sure you discuss any questions you have with your health care provider. °Document Released: 03/24/2005 Document Revised: 03/08/2016 Document Reviewed: 03/08/2016 °Elsevier Interactive Patient Education © 2017 [employer] ° °

## 2017-04-25 NOTE — Assessment & Plan Note (Signed)
Use otc allergy drops  rto prn
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
## 2017-04-25 NOTE — Assessment & Plan Note (Signed)
Pt just started using miracle oil from [employer] store and it seems to be helping

## 2017-04-29 ENCOUNTER — Ambulatory Visit
Admission: RE | Admit: 2017-04-29 | Discharge: 2017-04-29 | Disposition: A | Discharge status: Home / Self Care | Payer: Medicare HMO | Source: Ambulatory Visit | Attending: Family Medicine | Admitting: Family Medicine

## 2017-04-29 ENCOUNTER — Telehealth: Payer: Self-pay | Admitting: Family Medicine

## 2017-04-29 DIAGNOSIS — N6459 Other signs and symptoms in breast: Secondary | ICD-10-CM

## 2017-04-29 DIAGNOSIS — N644 Mastodynia: Secondary | ICD-10-CM | POA: Diagnosis not present

## 2017-04-29 DIAGNOSIS — R922 Inconclusive mammogram: Secondary | ICD-10-CM | POA: Diagnosis not present

## 2017-04-29 DIAGNOSIS — M1711 Unilateral primary osteoarthritis, right knee: Secondary | ICD-10-CM | POA: Diagnosis not present

## 2017-04-29 DIAGNOSIS — N6489 Other specified disorders of breast: Secondary | ICD-10-CM | POA: Diagnosis not present

## 2017-04-29 NOTE — Telephone Encounter (Signed)
I have not seen results  Will put in order after I see results

## 2017-04-29 NOTE — Telephone Encounter (Signed)
Caller name:[name] Relationship to patient: Can be reached:[phone] Pharmacy:  Reason for call:just had Mammogram and [place], states know she need MRI. Please advise. [facility] is sending over results.

## 2017-04-30 ENCOUNTER — Encounter: Payer: Self-pay | Admitting: Family Medicine

## 2017-05-02 DIAGNOSIS — Z86010 Personal history of colonic polyps: Secondary | ICD-10-CM | POA: Diagnosis not present

## 2017-05-02 DIAGNOSIS — R1311 Dysphagia, oral phase: Secondary | ICD-10-CM | POA: Diagnosis not present

## 2017-05-04 ENCOUNTER — Ambulatory Visit (INDEPENDENT_AMBULATORY_CARE_PROVIDER_SITE_OTHER): Payer: Medicare HMO | Admitting: Family Medicine

## 2017-05-04 ENCOUNTER — Ambulatory Visit
Admission: RE | Admit: 2017-05-04 | Discharge: 2017-05-04 | Disposition: A | Discharge status: Home / Self Care | Payer: Medicare HMO | Source: Ambulatory Visit | Attending: Family Medicine | Admitting: Family Medicine

## 2017-05-04 ENCOUNTER — Encounter: Payer: Self-pay | Admitting: Family Medicine

## 2017-05-04 VITALS — BP 132/82 | HR 105 | Temp 97.6°F | Ht 66.0 in | Wt 183.6 lb

## 2017-05-04 DIAGNOSIS — R1032 Left lower quadrant pain: Secondary | ICD-10-CM

## 2017-05-04 DIAGNOSIS — R350 Frequency of micturition: Secondary | ICD-10-CM | POA: Diagnosis not present

## 2017-05-04 DIAGNOSIS — N3090 Cystitis, unspecified without hematuria: Secondary | ICD-10-CM | POA: Diagnosis not present

## 2017-05-04 LAB — COMPREHENSIVE METABOLIC PANEL
ALT: 22 U/L (ref 0–35)
AST: 21 U/L (ref 0–37)
Albumin: 4 g/dL (ref 3.5–5.2)
Alkaline Phosphatase: 106 U/L (ref 39–117)
BUN: 23 mg/dL (ref 6–23)
CO2: 29 mEq/L (ref 19–32)
Calcium: 8.9 mg/dL (ref 8.4–10.5)
Chloride: 98 mEq/L (ref 96–112)
Creatinine, Ser: 1.01 mg/dL (ref 0.40–1.20)
GFR: 59.4 mL/min — ABNORMAL LOW (ref >60.00)
Glucose, Bld: 101 mg/dL — ABNORMAL HIGH (ref 70–99)
Potassium: 3.9 mEq/L (ref 3.5–5.1)
Sodium: 134 mEq/L — ABNORMAL LOW (ref 135–145)
Total Bilirubin: 0.8 mg/dL (ref 0.2–1.2)
Total Protein: 6.9 g/dL (ref 6.0–8.3)

## 2017-05-04 LAB — POC URINALSYSI DIPSTICK (AUTOMATED)
Bilirubin, UA: NEGATIVE
Blood, UA: NEGATIVE
Glucose, UA: NEGATIVE
Ketones, UA: NEGATIVE
Leukocytes, UA: NEGATIVE
Nitrite, UA: NEGATIVE
Protein, UA: NEGATIVE
Spec Grav, UA: 1.01 (ref 1.010–1.025)
Urobilinogen, UA: NEGATIVE E.U./dL — AB
pH, UA: 6 (ref 5.0–8.0)

## 2017-05-04 LAB — CBC
HCT: 46 % (ref 36.0–46.0)
Hemoglobin: 14.9 g/dL (ref 12.0–15.0)
MCHC: 32.5 g/dL (ref 30.0–36.0)
MCV: 95.9 fl (ref 78.0–100.0)
Platelets: 308 10*3/uL (ref 150.0–400.0)
RBC: 4.8 Mil/uL (ref 3.87–5.11)
RDW: 13.1 % (ref 11.5–15.5)
WBC: 8.2 10*3/uL (ref 4.0–10.5)

## 2017-05-04 MED ORDER — SULFAMETHOXAZOLE-TRIMETHOPRIM 800-160 MG PO TABS: 1.0000 | ORAL_TABLET | Freq: Two times a day (BID) | ORAL | 0 refills | Status: DC

## 2017-05-04 MED ORDER — IOPAMIDOL (ISOVUE-300) INJECTION 61%
100.0000 mL | Freq: Once | INTRAVENOUS | Status: AC | PRN
  Administered 2017-05-04: 100 mL via INTRAVENOUS
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
## 2017-05-04 NOTE — Patient Instructions (Signed)
We will get your labs and CT today-  I will call you later today with your results!  We have also sent your urine off for a culture.

## 2017-05-05 ENCOUNTER — Encounter: Payer: Self-pay | Admitting: Family Medicine

## 2017-05-05 LAB — URINE CULTURE
MICRO NUMBER:: 81252378
SPECIMEN QUALITY:: ADEQUATE

## 2017-05-06 ENCOUNTER — Encounter: Payer: Self-pay | Admitting: Family Medicine

## 2017-05-06 ENCOUNTER — Other Ambulatory Visit: Payer: Self-pay | Admitting: Family Medicine

## 2017-05-06 DIAGNOSIS — C50912 Malignant neoplasm of unspecified site of left female breast: Secondary | ICD-10-CM

## 2017-05-06 NOTE — Telephone Encounter (Signed)
Dr. Charlett Blake I think the patient meant this for you.  You are her PCP.

## 2017-05-09 ENCOUNTER — Other Ambulatory Visit: Payer: Self-pay | Admitting: Family Medicine

## 2017-05-09 DIAGNOSIS — C50912 Malignant neoplasm of unspecified site of left female breast: Secondary | ICD-10-CM

## 2017-05-09 DIAGNOSIS — H5203 Hypermetropia, bilateral: Secondary | ICD-10-CM | POA: Diagnosis not present

## 2017-05-09 DIAGNOSIS — H2513 Age-related nuclear cataract, bilateral: Secondary | ICD-10-CM | POA: Diagnosis not present

## 2017-05-10 ENCOUNTER — Telehealth: Payer: Self-pay | Admitting: Family Medicine

## 2017-05-10 ENCOUNTER — Other Ambulatory Visit: Payer: Self-pay | Admitting: Family Medicine

## 2017-05-10 DIAGNOSIS — C50919 Malignant neoplasm of unspecified site of unspecified female breast: Secondary | ICD-10-CM

## 2017-05-10 NOTE — Telephone Encounter (Signed)
Pt called for status of MRI at [facility]. Pt states it is urgent that it is scheduled ASAP.

## 2017-05-11 ENCOUNTER — Emergency Department (HOSPITAL_COMMUNITY): Payer: Medicare HMO

## 2017-05-11 ENCOUNTER — Emergency Department (HOSPITAL_COMMUNITY)
Admission: EM | Admit: 2017-05-11 | Discharge: 2017-05-11 | Disposition: A | Discharge status: Home / Self Care | Payer: Medicare HMO | Attending: Physician Assistant | Admitting: Physician Assistant

## 2017-05-11 ENCOUNTER — Encounter (HOSPITAL_COMMUNITY): Payer: Self-pay | Admitting: Radiology

## 2017-05-11 ENCOUNTER — Other Ambulatory Visit: Payer: Self-pay

## 2017-05-11 DIAGNOSIS — K529 Noninfective gastroenteritis and colitis, unspecified: Secondary | ICD-10-CM | POA: Diagnosis not present

## 2017-05-11 DIAGNOSIS — E039 Hypothyroidism, unspecified: Secondary | ICD-10-CM | POA: Diagnosis not present

## 2017-05-11 DIAGNOSIS — J45909 Unspecified asthma, uncomplicated: Secondary | ICD-10-CM | POA: Insufficient documentation

## 2017-05-11 DIAGNOSIS — I2510 Atherosclerotic heart disease of native coronary artery without angina pectoris: Secondary | ICD-10-CM | POA: Diagnosis not present

## 2017-05-11 DIAGNOSIS — Z79899 Other long term (current) drug therapy: Secondary | ICD-10-CM | POA: Insufficient documentation

## 2017-05-11 DIAGNOSIS — K625 Hemorrhage of anus and rectum: Secondary | ICD-10-CM | POA: Diagnosis present

## 2017-05-11 DIAGNOSIS — K6289 Other specified diseases of anus and rectum: Secondary | ICD-10-CM | POA: Diagnosis not present

## 2017-05-11 LAB — ABO/RH: ABO/RH(D): A POS

## 2017-05-11 LAB — CBC WITH DIFFERENTIAL/PLATELET
Basophils Absolute: 0 10*3/uL (ref 0.0–0.1)
Basophils Relative: 0 %
Eosinophils Absolute: 0 10*3/uL (ref 0.0–0.7)
Eosinophils Relative: 0 %
HCT: 44.1 % (ref 36.0–46.0)
Hemoglobin: 15.2 g/dL — ABNORMAL HIGH (ref 12.0–15.0)
Lymphocytes Relative: 3 %
Lymphs Abs: 0.7 10*3/uL (ref 0.7–4.0)
MCH: 31.9 pg (ref 26.0–34.0)
MCHC: 34.5 g/dL (ref 30.0–36.0)
MCV: 92.6 fL (ref 78.0–100.0)
Monocytes Absolute: 1 10*3/uL (ref 0.1–1.0)
Monocytes Relative: 5 %
Neutro Abs: 19.7 10*3/uL — ABNORMAL HIGH (ref 1.7–7.7)
Neutrophils Relative %: 92 %
Platelets: 259 10*3/uL (ref 150–400)
RBC: 4.76 MIL/uL (ref 3.87–5.11)
RDW: 13.3 % (ref 11.5–15.5)
WBC: 21.5 10*3/uL — ABNORMAL HIGH (ref 4.0–10.5)

## 2017-05-11 LAB — COMPREHENSIVE METABOLIC PANEL
ALT: 41 U/L (ref 14–54)
AST: 46 U/L — ABNORMAL HIGH (ref 15–41)
Albumin: 3.7 g/dL (ref 3.5–5.0)
Alkaline Phosphatase: 116 U/L (ref 38–126)
Anion gap: 11 (ref 5–15)
BUN: 19 mg/dL (ref 6–20)
CO2: 19 mmol/L — ABNORMAL LOW (ref 22–32)
Calcium: 8.7 mg/dL — ABNORMAL LOW (ref 8.9–10.3)
Chloride: 103 mmol/L (ref 101–111)
Creatinine, Ser: 1.07 mg/dL — ABNORMAL HIGH (ref 0.44–1.00)
GFR calc Af Amer: >60 mL/min (ref >60)
GFR calc non Af Amer: 55 mL/min — ABNORMAL LOW (ref >60)
Glucose, Bld: 144 mg/dL — ABNORMAL HIGH (ref 65–99)
Potassium: 4.3 mmol/L (ref 3.5–5.1)
Sodium: 133 mmol/L — ABNORMAL LOW (ref 135–145)
Total Bilirubin: 0.6 mg/dL (ref 0.3–1.2)
Total Protein: 6.6 g/dL (ref 6.5–8.1)

## 2017-05-11 LAB — URINALYSIS, ROUTINE W REFLEX MICROSCOPIC
Bilirubin Urine: NEGATIVE
Glucose, UA: NEGATIVE mg/dL
Hgb urine dipstick: NEGATIVE
Ketones, ur: NEGATIVE mg/dL
Leukocytes, UA: NEGATIVE
Nitrite: NEGATIVE
Protein, ur: NEGATIVE mg/dL
Specific Gravity, Urine: 1.009 (ref 1.005–1.030)
pH: 5 (ref 5.0–8.0)

## 2017-05-11 LAB — TYPE AND SCREEN
ABO/RH(D): A POS
Antibody Screen: NEGATIVE

## 2017-05-11 LAB — POC OCCULT BLOOD, ED: Fecal Occult Bld: POSITIVE — AB

## 2017-05-11 MED ORDER — VANCOMYCIN HCL 125 MG PO CAPS: 125.0000 mg | ORAL_CAPSULE | Freq: Four times a day (QID) | ORAL | 0 refills | Status: DC

## 2017-05-11 MED ORDER — SODIUM CHLORIDE 0.9 % IV BOLUS (SEPSIS)
1000.0000 mL | Freq: Once | INTRAVENOUS | Status: AC
  Administered 2017-05-11: 1000 mL via INTRAVENOUS

## 2017-05-11 MED ORDER — IOPAMIDOL (ISOVUE-300) INJECTION 61%
INTRAVENOUS | Status: AC
  Filled 2017-05-11: qty 100

## 2017-05-11 MED ORDER — IOPAMIDOL (ISOVUE-300) INJECTION 61%
100.0000 mL | Freq: Once | INTRAVENOUS | Status: AC | PRN
  Administered 2017-05-11: 100 mL via INTRAVENOUS

## 2017-05-11 NOTE — Discharge Instructions (Signed)
It was my pleasure taking care of you today!   Please take all of your antibiotics until finished!  Please call your primary care doctor today to schedule a follow up appointment. We would also like you to have a stool study done. Please let your primary care doctor know that we recommended this and bring a sample for them to send off for testing.   Return to ER for fever, vomiting, new or worsening symptoms, any additional concerns.

## 2017-05-11 NOTE — ED Triage Notes (Signed)
Pt report rectal bleeding since 0130 tonight , x4 large BM of bright red blood. Also reports several episodes of nausea and vomiting.

## 2017-05-11 NOTE — ED Notes (Signed)
Pt ambulated to restroom with NAD noted.
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
## 2017-05-12 LAB — URINE CULTURE: Culture: NO GROWTH

## 2017-05-13 ENCOUNTER — Telehealth: Payer: Self-pay | Admitting: Family Medicine

## 2017-05-13 NOTE — Telephone Encounter (Signed)
°  Relation to JG:YLUD Call back number:[phone]     Reason for call:     Patient was seen in the ED 05/11/17 for Enteritis and scheduled follow up with Dr. Lorelei Pont due to PCP not having any availability. Patient was advised to drop off a stool sample at PCP office due to her ongoing diarrhea, patient would like to drop off sample today.     Patient states due to her being a cancer survivor she would like her MRI of the breast as soon as possible, [facility] cant see patient until 05/22/17 and would like MRI conducted in the early part of next week, please advise

## 2017-05-13 NOTE — Telephone Encounter (Signed)
All Breast MRI must go to [facility]

## 2017-05-15 NOTE — Telephone Encounter (Signed)
I can see that she had a gastroenterology panel by PCR ordered on 11/14, is she aware? Do we need to reorder? If so please place order. Can see if [name] can do her MRI sooner but with the [facility] not sure it can get done.
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
## 2017-05-16 ENCOUNTER — Emergency Department (HOSPITAL_BASED_OUTPATIENT_CLINIC_OR_DEPARTMENT_OTHER)
Admission: EM | Admit: 2017-05-16 | Discharge: 2017-05-16 | Disposition: A | Discharge status: Home / Self Care | Payer: Medicare HMO | Attending: Emergency Medicine | Admitting: Emergency Medicine

## 2017-05-16 ENCOUNTER — Other Ambulatory Visit: Payer: Self-pay

## 2017-05-16 ENCOUNTER — Encounter: Payer: Self-pay | Admitting: Family Medicine

## 2017-05-16 ENCOUNTER — Ambulatory Visit (INDEPENDENT_AMBULATORY_CARE_PROVIDER_SITE_OTHER): Payer: Medicare HMO | Admitting: Family Medicine

## 2017-05-16 ENCOUNTER — Encounter (HOSPITAL_BASED_OUTPATIENT_CLINIC_OR_DEPARTMENT_OTHER): Payer: Self-pay | Admitting: *Deleted

## 2017-05-16 VITALS — BP 134/90 | HR 95 | Temp 98.0°F | Ht 66.0 in | Wt 180.8 lb

## 2017-05-16 DIAGNOSIS — Z79899 Other long term (current) drug therapy: Secondary | ICD-10-CM | POA: Insufficient documentation

## 2017-05-16 DIAGNOSIS — E860 Dehydration: Secondary | ICD-10-CM

## 2017-05-16 DIAGNOSIS — Z853 Personal history of malignant neoplasm of breast: Secondary | ICD-10-CM | POA: Diagnosis not present

## 2017-05-16 DIAGNOSIS — R197 Diarrhea, unspecified: Secondary | ICD-10-CM

## 2017-05-16 DIAGNOSIS — E039 Hypothyroidism, unspecified: Secondary | ICD-10-CM | POA: Diagnosis not present

## 2017-05-16 DIAGNOSIS — I2510 Atherosclerotic heart disease of native coronary artery without angina pectoris: Secondary | ICD-10-CM | POA: Insufficient documentation

## 2017-05-16 DIAGNOSIS — R109 Unspecified abdominal pain: Secondary | ICD-10-CM | POA: Diagnosis not present

## 2017-05-16 LAB — COMPREHENSIVE METABOLIC PANEL
ALT: 28 U/L (ref 14–54)
AST: 30 U/L (ref 15–41)
Albumin: 3.6 g/dL (ref 3.5–5.0)
Alkaline Phosphatase: 88 U/L (ref 38–126)
Anion gap: 5 (ref 5–15)
BUN: 15 mg/dL (ref 6–20)
CO2: 27 mmol/L (ref 22–32)
Calcium: 8.3 mg/dL — ABNORMAL LOW (ref 8.9–10.3)
Chloride: 104 mmol/L (ref 101–111)
Creatinine, Ser: 0.95 mg/dL (ref 0.44–1.00)
GFR calc Af Amer: >60 mL/min (ref >60)
GFR calc non Af Amer: >60 mL/min (ref >60)
Glucose, Bld: 105 mg/dL — ABNORMAL HIGH (ref 65–99)
Potassium: 3.4 mmol/L — ABNORMAL LOW (ref 3.5–5.1)
Sodium: 136 mmol/L (ref 135–145)
Total Bilirubin: 0.6 mg/dL (ref 0.3–1.2)
Total Protein: 6.6 g/dL (ref 6.5–8.1)

## 2017-05-16 LAB — CBC WITH DIFFERENTIAL/PLATELET
Basophils Absolute: 0.1 10*3/uL (ref 0.0–0.1)
Basophils Relative: 1 %
Eosinophils Absolute: 0.1 10*3/uL (ref 0.0–0.7)
Eosinophils Relative: 1 %
HCT: 40.9 % (ref 36.0–46.0)
Hemoglobin: 13.5 g/dL (ref 12.0–15.0)
Lymphocytes Relative: 18 %
Lymphs Abs: 1.2 10*3/uL (ref 0.7–4.0)
MCH: 30.9 pg (ref 26.0–34.0)
MCHC: 33 g/dL (ref 30.0–36.0)
MCV: 93.6 fL (ref 78.0–100.0)
Monocytes Absolute: 0.5 10*3/uL (ref 0.1–1.0)
Monocytes Relative: 8 %
Neutro Abs: 4.8 10*3/uL (ref 1.7–7.7)
Neutrophils Relative %: 72 %
Platelets: 235 10*3/uL (ref 150–400)
RBC: 4.37 MIL/uL (ref 3.87–5.11)
RDW: 13.1 % (ref 11.5–15.5)
WBC: 6.6 10*3/uL (ref 4.0–10.5)

## 2017-05-16 MED ORDER — SODIUM CHLORIDE 0.9 % IV BOLUS (SEPSIS)
1000.0000 mL | Freq: Once | INTRAVENOUS | Status: AC
  Administered 2017-05-16: 1000 mL via INTRAVENOUS

## 2017-05-16 MED ORDER — ONDANSETRON HCL 4 MG/2ML IJ SOLN
INTRAMUSCULAR | Status: AC
  Administered 2017-05-16: 4 mg
  Filled 2017-05-16: qty 2

## 2017-05-16 NOTE — ED Notes (Signed)
Pt states she brought a stool specimen to her provider.  Noted collected at 1114 this am.  No

## 2017-05-16 NOTE — Discharge Instructions (Signed)
Continue antibiotics as prescribed.  Drink plenty of clear liquids as discussed.  Follow-up with your primary care doctor as needed.
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
## 2017-05-16 NOTE — ED Triage Notes (Signed)
Left mid quadrant abdominal pain. Nausea. Diarrhea x 5 days. She was seen upstairs by her MD this am for possible dehydration. She brought a stool specimen and the office is sending it for testing to r/o c diff. She does not have a hx of cdiff. She was treated for a UTI with sulfa last week and the diarrhea started with one day left of the antibiotic. She had blood in her stool on 11/14.

## 2017-05-17 LAB — CLOSTRIDIUM DIFFICILE BY PCR: Toxigenic C. Difficile by PCR: NOT DETECTED

## 2017-05-17 NOTE — Telephone Encounter (Signed)
Patient came to see Dr.Copland

## 2017-05-18 ENCOUNTER — Encounter: Payer: Self-pay | Admitting: Family Medicine

## 2017-05-22 ENCOUNTER — Ambulatory Visit
Admission: RE | Admit: 2017-05-22 | Discharge: 2017-05-22 | Disposition: A | Discharge status: Home / Self Care | Payer: Medicare HMO | Source: Ambulatory Visit | Attending: Family Medicine | Admitting: Family Medicine

## 2017-05-22 DIAGNOSIS — C50919 Malignant neoplasm of unspecified site of unspecified female breast: Secondary | ICD-10-CM

## 2017-05-22 DIAGNOSIS — N6489 Other specified disorders of breast: Secondary | ICD-10-CM | POA: Diagnosis not present

## 2017-05-22 MED ORDER — GADOBENATE DIMEGLUMINE 529 MG/ML IV SOLN
14.0000 mL | Freq: Once | INTRAVENOUS | Status: AC | PRN
  Administered 2017-05-22: 14 mL via INTRAVENOUS

## 2017-05-26 ENCOUNTER — Encounter: Payer: Self-pay | Admitting: Family Medicine

## 2017-05-31 DIAGNOSIS — M5136 Other intervertebral disc degeneration, lumbar region: Secondary | ICD-10-CM | POA: Diagnosis not present

## 2017-06-23 DIAGNOSIS — M5136 Other intervertebral disc degeneration, lumbar region: Secondary | ICD-10-CM | POA: Diagnosis not present

## 2017-06-29 DIAGNOSIS — M1711 Unilateral primary osteoarthritis, right knee: Secondary | ICD-10-CM | POA: Diagnosis not present

## 2017-06-29 DIAGNOSIS — M1611 Unilateral primary osteoarthritis, right hip: Secondary | ICD-10-CM | POA: Diagnosis not present

## 2017-07-04 ENCOUNTER — Other Ambulatory Visit: Payer: Self-pay | Admitting: Oncology

## 2017-07-04 DIAGNOSIS — Z1231 Encounter for screening mammogram for malignant neoplasm of breast: Secondary | ICD-10-CM

## 2017-07-20 DIAGNOSIS — M25551 Pain in right hip: Secondary | ICD-10-CM | POA: Diagnosis not present

## 2017-07-20 DIAGNOSIS — M5136 Other intervertebral disc degeneration, lumbar region: Secondary | ICD-10-CM | POA: Diagnosis not present

## 2017-07-20 DIAGNOSIS — M545 Low back pain: Secondary | ICD-10-CM | POA: Diagnosis not present

## 2017-07-20 DIAGNOSIS — M5416 Radiculopathy, lumbar region: Secondary | ICD-10-CM | POA: Diagnosis not present

## 2017-07-20 DIAGNOSIS — M1611 Unilateral primary osteoarthritis, right hip: Secondary | ICD-10-CM | POA: Diagnosis not present

## 2017-07-21 ENCOUNTER — Ambulatory Visit (INDEPENDENT_AMBULATORY_CARE_PROVIDER_SITE_OTHER): Payer: Medicare HMO | Admitting: Family Medicine

## 2017-07-21 ENCOUNTER — Encounter: Payer: Self-pay | Admitting: Family Medicine

## 2017-07-21 VITALS — BP 130/88 | HR 96 | Temp 97.8°F | Resp 18 | Wt 174.4 lb

## 2017-07-21 DIAGNOSIS — R000 Tachycardia, unspecified: Secondary | ICD-10-CM

## 2017-07-21 DIAGNOSIS — M549 Dorsalgia, unspecified: Secondary | ICD-10-CM

## 2017-07-21 DIAGNOSIS — E039 Hypothyroidism, unspecified: Secondary | ICD-10-CM

## 2017-07-21 DIAGNOSIS — E785 Hyperlipidemia, unspecified: Secondary | ICD-10-CM

## 2017-07-21 DIAGNOSIS — E559 Vitamin D deficiency, unspecified: Secondary | ICD-10-CM | POA: Diagnosis not present

## 2017-07-21 MED ORDER — BUPROPION HCL ER (XL) 300 MG PO TB24: 300.0000 mg | ORAL_TABLET | Freq: Every day | ORAL | 1 refills | Status: DC

## 2017-07-21 MED ORDER — LEVOTHYROXINE SODIUM 75 MCG PO TABS: 75.0000 ug | ORAL_TABLET | Freq: Every day | ORAL | 1 refills | Status: DC

## 2017-07-21 MED ORDER — FLUOXETINE HCL 10 MG PO TABS: 10.0000 mg | ORAL_TABLET | Freq: Every day | ORAL | 1 refills | Status: DC

## 2017-07-21 NOTE — Patient Instructions (Addendum)
shingrix is the new shot 2 shots over 2-6 months can call her monthly Try Lidocaine gel by aspercreme, icy hot or salon pas  Hip Pain The hip is the joint between the upper legs and the lower pelvis. The bones, cartilage, tendons, and muscles of your hip joint support your body and allow you to move around. Hip pain can range from a minor ache to severe pain in one or both of your hips. The pain may be felt on the inside of the hip joint near the groin, or the outside near the buttocks and upper thigh. You may also have swelling or stiffness. Follow these instructions at home: Managing pain, stiffness, and swelling  If directed, apply ice to the injured area. ? Put ice in a plastic bag. ? Place a towel between your skin and the bag. ? Leave the ice on for 20 minutes, 2-3 times a day  Sleep with a pillow between your legs on your most comfortable side.  Avoid any activities that cause pain. General instructions  Take over-the-counter and prescription medicines only as told by your health care provider.  Do any exercises as told by your health care provider.  Record the following: ? How often you have hip pain. ? The location of your pain. ? What the pain feels like. ? What makes the pain worse.  Keep all follow-up visits as told by your health care provider. This is important. Contact a health care provider if:  You cannot put weight on your leg.  Your pain or swelling continues or gets worse after one week.  It gets harder to walk.  You have a fever. Get help right away if:  You fall.  You have a sudden increase in pain and swelling in your hip.  Your hip is red or swollen or very tender to touch. Summary  Hip pain can range from a minor ache to severe pain in one or both of your hips.  The pain may be felt on the inside of the hip joint near the groin, or the outside near the buttocks and upper thigh.  Avoid any activities that cause pain.  Record how often you  have hip pain, the location of the pain, what makes it worse and what it feels like. This information is not intended to replace advice given to you by your health care provider. Make sure you discuss any questions you have with your health care provider. Document Released: 12/02/2009 Document Revised: 05/17/2016 Document Reviewed: 05/17/2016 Elsevier Interactive Patient Education  [employer].

## 2017-07-21 NOTE — Assessment & Plan Note (Signed)
Check thyroid level today

## 2017-07-21 NOTE — Assessment & Plan Note (Signed)
Encouraged heart healthy diet, increase exercise, avoid trans fats, consider a krill oil cap daily 
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
## 2017-07-22 LAB — COMPREHENSIVE METABOLIC PANEL
ALT: 19 U/L (ref 0–35)
AST: 23 U/L (ref 0–37)
Albumin: 4.3 g/dL (ref 3.5–5.2)
Alkaline Phosphatase: 107 U/L (ref 39–117)
BUN: 20 mg/dL (ref 6–23)
CO2: 27 mEq/L (ref 19–32)
Calcium: 9.4 mg/dL (ref 8.4–10.5)
Chloride: 95 mEq/L — ABNORMAL LOW (ref 96–112)
Creatinine, Ser: 1.01 mg/dL (ref 0.40–1.20)
GFR: 59.36 mL/min — ABNORMAL LOW (ref >60.00)
Glucose, Bld: 104 mg/dL — ABNORMAL HIGH (ref 70–99)
Potassium: 3.7 mEq/L (ref 3.5–5.1)
Sodium: 137 mEq/L (ref 135–145)
Total Bilirubin: 0.6 mg/dL (ref 0.2–1.2)
Total Protein: 7.2 g/dL (ref 6.0–8.3)

## 2017-07-22 LAB — VITAMIN D 25 HYDROXY (VIT D DEFICIENCY, FRACTURES): VITD: 17.18 ng/mL — ABNORMAL LOW (ref 30.00–100.00)

## 2017-07-22 LAB — LIPID PANEL
Cholesterol: 240 mg/dL — ABNORMAL HIGH (ref 0–200)
HDL: 53.9 mg/dL (ref >39.00)
LDL Cholesterol: 164 mg/dL — ABNORMAL HIGH (ref 0–99)
NonHDL: 186.12
Total CHOL/HDL Ratio: 4
Triglycerides: 111 mg/dL (ref 0.0–149.0)
VLDL: 22.2 mg/dL (ref 0.0–40.0)

## 2017-07-22 LAB — CBC
HCT: 46.1 % — ABNORMAL HIGH (ref 36.0–46.0)
Hemoglobin: 15.2 g/dL — ABNORMAL HIGH (ref 12.0–15.0)
MCHC: 32.9 g/dL (ref 30.0–36.0)
MCV: 96.1 fl (ref 78.0–100.0)
Platelets: 321 10*3/uL (ref 150.0–400.0)
RBC: 4.8 Mil/uL (ref 3.87–5.11)
RDW: 14.3 % (ref 11.5–15.5)
WBC: 9.1 10*3/uL (ref 4.0–10.5)

## 2017-07-22 LAB — TSH: TSH: 0.91 u[IU]/mL (ref 0.35–4.50)

## 2017-07-23 ENCOUNTER — Encounter: Payer: Self-pay | Admitting: Family Medicine

## 2017-07-24 NOTE — Assessment & Plan Note (Signed)
Improved on recheck. Will monitor and she will report worsening symptoms

## 2017-07-24 NOTE — Assessment & Plan Note (Signed)
Encouraged daily supplements 

## 2017-07-27 ENCOUNTER — Ambulatory Visit: Payer: Self-pay

## 2017-07-27 ENCOUNTER — Other Ambulatory Visit: Payer: Self-pay | Admitting: Family Medicine

## 2017-07-27 DIAGNOSIS — M25551 Pain in right hip: Secondary | ICD-10-CM | POA: Diagnosis not present

## 2017-07-27 DIAGNOSIS — M545 Low back pain: Secondary | ICD-10-CM | POA: Diagnosis not present

## 2017-07-27 MED ORDER — VITAMIN D (ERGOCALCIFEROL) 1.25 MG (50000 UNIT) PO CAPS: 50000.0000 [IU] | ORAL_CAPSULE | ORAL | 4 refills | Status: DC

## 2017-07-27 NOTE — Telephone Encounter (Signed)
I have sent in the vitamin D for the patient that I prescribed in my note

## 2017-07-27 NOTE — Telephone Encounter (Signed)
Patient sent my chart message regarding Vit d level and prescription for vit d. Medication was never called in if it was suppose to. In her lab results it looks like it should have been sent in to her pharmacy but never was. I have pended order please double check right medication, dose, frequency.

## 2017-08-10 DIAGNOSIS — M1711 Unilateral primary osteoarthritis, right knee: Secondary | ICD-10-CM | POA: Diagnosis not present

## 2017-08-10 DIAGNOSIS — M5116 Intervertebral disc disorders with radiculopathy, lumbar region: Secondary | ICD-10-CM | POA: Diagnosis not present

## 2017-08-10 DIAGNOSIS — M1611 Unilateral primary osteoarthritis, right hip: Secondary | ICD-10-CM | POA: Diagnosis not present

## 2017-08-12 ENCOUNTER — Ambulatory Visit
Admission: RE | Admit: 2017-08-12 | Discharge: 2017-08-12 | Disposition: A | Discharge status: Home / Self Care | Payer: Medicare HMO | Source: Ambulatory Visit | Attending: Oncology | Admitting: Oncology

## 2017-08-12 DIAGNOSIS — Z1231 Encounter for screening mammogram for malignant neoplasm of breast: Secondary | ICD-10-CM | POA: Diagnosis not present

## 2017-08-12 DIAGNOSIS — M1611 Unilateral primary osteoarthritis, right hip: Secondary | ICD-10-CM | POA: Diagnosis not present

## 2017-08-12 DIAGNOSIS — M4726 Other spondylosis with radiculopathy, lumbar region: Secondary | ICD-10-CM | POA: Diagnosis not present

## 2017-08-12 DIAGNOSIS — M25551 Pain in right hip: Secondary | ICD-10-CM | POA: Diagnosis not present

## 2017-08-12 HISTORY — DX: Personal history of irradiation: Z92.3

## 2017-08-12 HISTORY — DX: Personal history of antineoplastic chemotherapy: Z92.21

## 2017-09-02 DIAGNOSIS — M5416 Radiculopathy, lumbar region: Secondary | ICD-10-CM | POA: Diagnosis not present

## 2017-09-02 DIAGNOSIS — G894 Chronic pain syndrome: Secondary | ICD-10-CM | POA: Diagnosis not present

## 2017-09-09 ENCOUNTER — Telehealth: Payer: Self-pay

## 2017-09-09 NOTE — Telephone Encounter (Signed)
   [facility] Pre-operative Risk Assessment    Request for surgical clearance:  1. What type of surgery is being performed? right hip THA w/wo autograft/allograft   2. When is this surgery scheduled? 10/25/17   3. What type of clearance is required (medical clearance vs. Pharmacy clearance to hold med vs. Both)? medical  4. Are there any medications that need to be held prior to surgery and how long? n/a   5. Practice name and name of physician performing surgery? [facility] - Dr [name]   6. What is your office phone and fax number? ph: [phone]; f: [phone]  7. Anesthesia type (None, local, MAC, general) ? spinal

## 2017-09-13 NOTE — Telephone Encounter (Signed)
Left message for patient to call back to schedule appt  Notification faxed via epic to [facility]

## 2017-09-13 NOTE — Telephone Encounter (Signed)
   Primary Cardiologist:James Hochrein, MD  Chart reviewed as part of pre-operative protocol coverage. Because of Coti R Casso's past medical history and time since last visit, he/she will require a follow-up visit in order to better assess preoperative cardiovascular risk.  Dr. Percival Spanish last saw her April 2018.  Pre-op covering staff: - Please schedule appointment and call patient to inform them. - Please contact requesting surgeon's office via preferred method (i.e, phone, fax) to inform them of need for appointment prior to surgery.  Rosaria Ferries, PA-C  09/13/2017, 4:05 PM

## 2017-09-15 DIAGNOSIS — M5136 Other intervertebral disc degeneration, lumbar region: Secondary | ICD-10-CM | POA: Diagnosis not present

## 2017-09-20 ENCOUNTER — Telehealth: Payer: Self-pay | Admitting: Emergency Medicine

## 2017-09-20 NOTE — Telephone Encounter (Signed)
Left message for patient to call back. Spoke with Janett Billow, the patient can be scheduled with a NP for a surgical clearance.

## 2017-09-21 NOTE — Telephone Encounter (Signed)
Spoke with patient. She stated that she is scheduled to have hip surgery on 4/30 and needs to have a surgical clearance appt before then. RB's first appt was not until  May 2019. Patient stated that she has already asked [facility] if a NP could do the clearance and they stated that was ok.   Patient has been scheduled with TP for 09/29/17 at 4pm. She verbalized understanding. Nothing else needed at time of call.

## 2017-09-28 ENCOUNTER — Ambulatory Visit: Payer: Medicare HMO | Admitting: Physician Assistant

## 2017-09-28 ENCOUNTER — Encounter: Payer: Self-pay | Admitting: Physician Assistant

## 2017-09-28 ENCOUNTER — Telehealth: Payer: Self-pay | Admitting: *Deleted

## 2017-09-28 VITALS — BP 108/76 | HR 93 | Ht 65.5 in | Wt 171.2 lb

## 2017-09-28 DIAGNOSIS — Z01810 Encounter for preprocedural cardiovascular examination: Secondary | ICD-10-CM

## 2017-09-28 DIAGNOSIS — E785 Hyperlipidemia, unspecified: Secondary | ICD-10-CM

## 2017-09-28 DIAGNOSIS — I779 Disorder of arteries and arterioles, unspecified: Secondary | ICD-10-CM

## 2017-09-28 DIAGNOSIS — I428 Other cardiomyopathies: Secondary | ICD-10-CM

## 2017-09-28 DIAGNOSIS — I2510 Atherosclerotic heart disease of native coronary artery without angina pectoris: Secondary | ICD-10-CM | POA: Diagnosis not present

## 2017-09-28 DIAGNOSIS — I739 Peripheral vascular disease, unspecified: Secondary | ICD-10-CM
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
## 2017-09-28 NOTE — Patient Instructions (Signed)
Medication Instructions:   NO CHANGE  Labwork:  DISCUSS CHOLESTEROL WITH MEDICAL DOCTOR-MEDICATIONS RECOMMENDED ARE LOVASTATIN OR PITVASTATIN   Follow-Up:  Your physician wants you to follow-up in: [place] will receive a reminder letter in the mail two months in advance. If you don't receive a letter, please call our office to schedule the follow-up appointment.   If you need a refill on your cardiac medications before your next appointment, please call your pharmacy.

## 2017-09-28 NOTE — Telephone Encounter (Signed)
   [facility] Pre-operative Risk Assessment    Request for surgical clearance:  1. What type of surgery is being performed? RIGHT HIP:THA W/WO AUTOGRAFT/ALLOGRAFT   2. When is this surgery scheduled? 10/25/2017   3. What type of clearance is required (medical clearance vs. Pharmacy clearance to hold med vs. Both)? MEDICAL  4. Are there any medications that need to be held prior to surgery and how long?NONE   5. Practice name and name of physician performing surgery? [name] MD   6. What is your office phone and fax number? [facility] PH=[phone] FAX=[phone]   7. Anesthesia type (None, local, MAC, general) ? SPINAL   [name] 09/28/2017, 12:14 PM  _________________________________________________________________   (provider comments below)  PATIENT WAS CLEARED AT OFFICE VISIT 09-28-17 AND PAPERWORK WAS FAXED TO THE NUMBER ABOVE.

## 2017-09-29 ENCOUNTER — Encounter: Payer: Self-pay | Admitting: Adult Health

## 2017-09-29 ENCOUNTER — Ambulatory Visit: Payer: Medicare HMO | Admitting: Adult Health

## 2017-09-29 ENCOUNTER — Telehealth: Payer: Self-pay | Admitting: Adult Health

## 2017-09-29 VITALS — BP 112/74 | HR 114 | Ht 65.5 in | Wt 171.0 lb

## 2017-09-29 DIAGNOSIS — R509 Fever, unspecified: Secondary | ICD-10-CM | POA: Diagnosis not present

## 2017-09-29 DIAGNOSIS — J301 Allergic rhinitis due to pollen: Secondary | ICD-10-CM

## 2017-09-29 DIAGNOSIS — J4520 Mild intermittent asthma, uncomplicated: Secondary | ICD-10-CM | POA: Diagnosis not present

## 2017-09-29 LAB — POCT INFLUENZA A/B
Influenza A, POC: NEGATIVE
Influenza B, POC: NEGATIVE

## 2017-09-29 MED ORDER — AZITHROMYCIN 250 MG PO TABS: ORAL_TABLET | ORAL | 0 refills | Status: AC

## 2017-09-29 MED ORDER — LEVALBUTEROL HCL 0.63 MG/3ML IN NEBU
0.6300 mg | INHALATION_SOLUTION | Freq: Once | RESPIRATORY_TRACT | Status: AC
  Administered 2017-09-29: 0.63 mg via RESPIRATORY_TRACT

## 2017-09-29 MED ORDER — PREDNISONE 10 MG PO TABS: ORAL_TABLET | ORAL | 0 refills | Status: DC

## 2017-09-29 NOTE — Assessment & Plan Note (Signed)
[facility]  Plan Patient Instructions  Zpack take as directed , take with food  Mucinex DM Twice daily  As needed  Cough/congestion Saline nasal rinses As needed   Fluids and rest .  Tylenol As needed   Restart  Synmbicort 2 puffs Twice daily  .  Follow up in 2 weeks for surgical clearance .  Please contact office for sooner follow up if symptoms do not improve or worsen or seek emergency care
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
## 2017-09-29 NOTE — Patient Instructions (Addendum)
Zpack take as directed , take with food  Mucinex DM Twice daily  As needed  Cough/congestion Saline nasal rinses As needed   Fluids and rest .  Tylenol As needed   Restart  Synmbicort 2 puffs Twice daily  .  Follow up in 2 weeks for surgical clearance .  Please contact office for sooner follow up if symptoms do not improve or worsen or seek emergency care

## 2017-09-29 NOTE — Telephone Encounter (Signed)
No availability in two weeks with TP or RB.  First available is 10/24/17 with TP.  TP please advise if 10/24/17 is okay. Thanks

## 2017-09-29 NOTE — Assessment & Plan Note (Addendum)
Exacerbation with URI xopenex neb x 1   Plan Patient Instructions  Zpack take as directed , take with food  Mucinex DM Twice daily  As needed  Cough/congestion Saline nasal rinses As needed   Fluids and rest .  Tylenol As needed   Restart  Synmbicort 2 puffs Twice daily  .  Follow up in 2 weeks for surgical clearance .  Please contact office for sooner follow up if symptoms do not improve or worsen or seek emergency care

## 2017-09-30 ENCOUNTER — Other Ambulatory Visit: Payer: Self-pay | Admitting: *Deleted

## 2017-09-30 ENCOUNTER — Encounter: Payer: Self-pay | Admitting: Family Medicine

## 2017-09-30 ENCOUNTER — Ambulatory Visit (INDEPENDENT_AMBULATORY_CARE_PROVIDER_SITE_OTHER): Payer: Medicare HMO | Admitting: Family Medicine

## 2017-09-30 ENCOUNTER — Encounter: Payer: Self-pay | Admitting: Physician Assistant

## 2017-09-30 VITALS — BP 118/78 | HR 92 | Temp 98.5°F | Resp 18 | Wt 171.2 lb

## 2017-09-30 DIAGNOSIS — E559 Vitamin D deficiency, unspecified: Secondary | ICD-10-CM | POA: Diagnosis not present

## 2017-09-30 DIAGNOSIS — J4520 Mild intermittent asthma, uncomplicated: Secondary | ICD-10-CM | POA: Diagnosis not present

## 2017-09-30 DIAGNOSIS — E039 Hypothyroidism, unspecified: Secondary | ICD-10-CM

## 2017-09-30 DIAGNOSIS — M25551 Pain in right hip: Secondary | ICD-10-CM

## 2017-09-30 DIAGNOSIS — C50811 Malignant neoplasm of overlapping sites of right female breast: Secondary | ICD-10-CM

## 2017-09-30 DIAGNOSIS — Z170 Estrogen receptor positive status [ER+]: Principal | ICD-10-CM

## 2017-09-30 NOTE — Assessment & Plan Note (Signed)
Is scheduled for right THR on 10/25/17 with Dr Alvan Dame. She is ready to proceed due to her level of pain and debility. She is cleared for surgery pending her pulmonology clearance. She has already been cleared by cardiology. She is aware she should not take any OTC meds prior to surgery for a full week.

## 2017-09-30 NOTE — Telephone Encounter (Signed)
Attempted to call patient to schedule appointment, no answer, message left.

## 2017-09-30 NOTE — Telephone Encounter (Signed)
Pt returned call to sched 2wk f/up apt w/TP. Per notes ok to sched pt on 4/29. Pt states she is having surgery on 4/30 and does not want to wait till the day before surgery to get the clearance. Pt sched w/TP 4/16. At this time nothing avail w/SG or RB before 4/29.

## 2017-09-30 NOTE — Assessment & Plan Note (Signed)
Was seen by pulmonology yesterday and started on Azithromycin and steroids to help with an exacerbation. She is going to be reevaluated by pulmonology and once they have seen an improvement they will clear her for surgery from a pulmonary perspective

## 2017-09-30 NOTE — Patient Instructions (Addendum)

## 2017-09-30 NOTE — Assessment & Plan Note (Signed)
On Levothyroxine, continue to monitor 
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
## 2017-09-30 NOTE — Addendum Note (Signed)
Addended by: Parke Poisson E on: 09/30/2017 09:04 AM   Modules accepted: Orders

## 2017-09-30 NOTE — Telephone Encounter (Signed)
Per TP: the 29th with me is fine, I'll be happy to see her.  If she would like to be seen sooner, she can see Judson Roch (she has openings on the 22nd).  Thanks.  (Jess still has the surgical clearance letter pt brought to the visit yesterday)

## 2017-09-30 NOTE — Assessment & Plan Note (Signed)
-

## 2017-10-03 ENCOUNTER — Inpatient Hospital Stay: Payer: Medicare HMO | Attending: Oncology

## 2017-10-03 DIAGNOSIS — Z853 Personal history of malignant neoplasm of breast: Secondary | ICD-10-CM | POA: Diagnosis not present

## 2017-10-03 DIAGNOSIS — R51 Headache: Secondary | ICD-10-CM | POA: Diagnosis not present

## 2017-10-03 DIAGNOSIS — C50811 Malignant neoplasm of overlapping sites of right female breast: Secondary | ICD-10-CM

## 2017-10-03 DIAGNOSIS — Z170 Estrogen receptor positive status [ER+]: Secondary | ICD-10-CM

## 2017-10-03 LAB — CMP (CANCER CENTER ONLY)
ALT: 22 U/L (ref 0–55)
AST: 23 U/L (ref 5–34)
Albumin: 3.5 g/dL (ref 3.5–5.0)
Alkaline Phosphatase: 94 U/L (ref 40–150)
Anion gap: 10 (ref 3–11)
BUN: 23 mg/dL (ref 7–26)
CO2: 25 mmol/L (ref 22–29)
Calcium: 9 mg/dL (ref 8.4–10.4)
Chloride: 107 mmol/L (ref 98–109)
Creatinine: 0.89 mg/dL (ref 0.60–1.10)
GFR, Est AFR Am: >60 mL/min (ref >60)
GFR, Estimated: >60 mL/min (ref >60)
Glucose, Bld: 94 mg/dL (ref 70–140)
Potassium: 3.5 mmol/L (ref 3.5–5.1)
Sodium: 142 mmol/L (ref 136–145)
Total Bilirubin: 0.6 mg/dL (ref 0.2–1.2)
Total Protein: 6.3 g/dL — ABNORMAL LOW (ref 6.4–8.3)

## 2017-10-03 LAB — CBC WITH DIFFERENTIAL (CANCER CENTER ONLY)
Basophils Absolute: 0 10*3/uL (ref 0.0–0.1)
Basophils Relative: 0 %
Eosinophils Absolute: 0 10*3/uL (ref 0.0–0.5)
Eosinophils Relative: 0 %
HCT: 41 % (ref 34.8–46.6)
Hemoglobin: 13.4 g/dL (ref 11.6–15.9)
Lymphocytes Relative: 17 %
Lymphs Abs: 1.3 10*3/uL (ref 0.9–3.3)
MCH: 31 pg (ref 25.1–34.0)
MCHC: 32.7 g/dL (ref 31.5–36.0)
MCV: 94.9 fL (ref 79.5–101.0)
Monocytes Absolute: 0.7 10*3/uL (ref 0.1–0.9)
Monocytes Relative: 9 %
Neutro Abs: 5.8 10*3/uL (ref 1.5–6.5)
Neutrophils Relative %: 74 %
Platelet Count: 278 10*3/uL (ref 145–400)
RBC: 4.32 MIL/uL (ref 3.70–5.45)
RDW: 13 % (ref 11.2–14.5)
WBC Count: 7.8 10*3/uL (ref 3.9–10.3)
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
## 2017-10-10 ENCOUNTER — Telehealth: Payer: Self-pay | Admitting: Oncology

## 2017-10-10 ENCOUNTER — Inpatient Hospital Stay: Payer: Medicare HMO | Admitting: Oncology

## 2017-10-10 ENCOUNTER — Ambulatory Visit (HOSPITAL_COMMUNITY)
Admission: RE | Admit: 2017-10-10 | Discharge: 2017-10-10 | Disposition: A | Discharge status: Home / Self Care | Payer: Medicare HMO | Source: Ambulatory Visit | Attending: Oncology | Admitting: Oncology

## 2017-10-10 VITALS — BP 130/77 | HR 107 | Temp 98.1°F | Resp 18 | Ht 65.5 in | Wt 171.3 lb

## 2017-10-10 DIAGNOSIS — Z170 Estrogen receptor positive status [ER+]: Principal | ICD-10-CM

## 2017-10-10 DIAGNOSIS — R51 Headache: Secondary | ICD-10-CM | POA: Diagnosis not present

## 2017-10-10 DIAGNOSIS — C50911 Malignant neoplasm of unspecified site of right female breast: Secondary | ICD-10-CM | POA: Diagnosis not present

## 2017-10-10 DIAGNOSIS — Z853 Personal history of malignant neoplasm of breast: Secondary | ICD-10-CM | POA: Diagnosis not present

## 2017-10-10 DIAGNOSIS — C50811 Malignant neoplasm of overlapping sites of right female breast: Secondary | ICD-10-CM | POA: Diagnosis not present

## 2017-10-10 DIAGNOSIS — R0781 Pleurodynia: Secondary | ICD-10-CM | POA: Diagnosis not present

## 2017-10-10 NOTE — Telephone Encounter (Signed)
Gave avs and calendar ° °

## 2017-10-11 ENCOUNTER — Ambulatory Visit: Payer: Medicare HMO | Admitting: Adult Health

## 2017-10-11 ENCOUNTER — Encounter: Payer: Self-pay | Admitting: Adult Health

## 2017-10-11 VITALS — BP 122/76 | HR 95 | Ht 65.0 in | Wt 171.0 lb

## 2017-10-11 DIAGNOSIS — R0781 Pleurodynia: Secondary | ICD-10-CM | POA: Diagnosis not present

## 2017-10-11 DIAGNOSIS — J4520 Mild intermittent asthma, uncomplicated: Secondary | ICD-10-CM

## 2017-10-11 DIAGNOSIS — Z01818 Encounter for other preprocedural examination: Secondary | ICD-10-CM | POA: Diagnosis not present

## 2017-10-11 MED ORDER — BUDESONIDE-FORMOTEROL FUMARATE 80-4.5 MCG/ACT IN AERO: 2.0000 | INHALATION_SPRAY | Freq: Two times a day (BID) | RESPIRATORY_TRACT | 3 refills | Status: DC
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
## 2017-10-11 NOTE — Patient Instructions (Signed)
Good luck with upcoming surgery .  Use incentive spirometry after surgery often , early mobility as able.  Continue Synmbicort 1 puffs Twice daily until after surgery then back to normal .  Try Advil 2 tabs Twice daily  For 5 day , take with food for rib pain , if not better will need further evaluation .  Follow up with Dr. Lamonte Sakai  In 3 months and As needed   Please contact office for sooner follow up if symptoms do not improve or worsen or seek emergency care

## 2017-10-12 DIAGNOSIS — Z01818 Encounter for other preprocedural examination: Secondary | ICD-10-CM | POA: Insufficient documentation

## 2017-10-12 NOTE — Assessment & Plan Note (Signed)
Recent flare with bronchitis.  Now resolved.  Spirometry is without airflow obstruction. Continue on Symbicort until after surgery.  And if doing well.  May go back to using Symbicort only during flareups.  Plan  Patient Instructions  Good luck with upcoming surgery .  Use incentive spirometry after surgery often , early mobility as able.  Continue Synmbicort 1 puffs Twice daily until after surgery then back to normal .  Try Advil 2 tabs Twice daily  For 5 day , take with food for rib pain , if not better will need further evaluation .  Follow up with Dr. Lamonte Sakai  In 3 months and As needed   Please contact office for sooner follow up if symptoms do not improve or worsen or seek emergency care

## 2017-10-12 NOTE — Assessment & Plan Note (Signed)
Pulmonary preop clearance.  Patient has had a recent asthma flare.  She has resolved now back to baseline.  Spirometry shows no airflow obstruction with only mild restriction.  Patient may proceed with upcoming hip surgery from a pulmonary aspect.  Advised on potential pulmonary risk.   Recommend . Short duration of surgery as much as possible and avoid paralytic if possible  DVT prophylaxis as indicated   Aggressive pulmonary toilet with o2, bronchodilatation, and incentive spirometry and early ambulation

## 2017-10-12 NOTE — Assessment & Plan Note (Signed)
Left-sided rib pain questionable etiology.  Rib films are unremarkable.  Possible muscular skeletal.  Advised to use Advil as needed for the next few days.  If not resolved will need further follow-up with primary care doctor.

## 2017-10-13 ENCOUNTER — Telehealth: Payer: Self-pay | Admitting: *Deleted

## 2017-10-13 NOTE — Telephone Encounter (Signed)
Xray results called to pt per her VM

## 2017-10-17 NOTE — Progress Notes (Signed)
Need orders in epic. Preop on 4/24.  Thanks.
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
## 2017-10-18 ENCOUNTER — Other Ambulatory Visit (HOSPITAL_COMMUNITY): Payer: Self-pay | Admitting: Emergency Medicine

## 2017-10-18 NOTE — Progress Notes (Signed)
LOV/PULMONARY CLEARANCE , TAMMY PARRETT 10-11-17 Epic   LOV CARDIOLOGY, HAO MENG, PA 09-28-17 Epic (SEE TELEPHONE NOTE FOR CLEARANCE )   EKG 09-28-17 Epic   CBCDIFF, CMP 10-03-17 Epic   ECHO 11-23-16 Epic   CXR 11-06-16 Epic

## 2017-10-19 ENCOUNTER — Encounter (HOSPITAL_COMMUNITY)
Admission: RE | Admit: 2017-10-19 | Discharge: 2017-10-19 | Disposition: A | Discharge status: Home / Self Care | Payer: Medicare HMO | Source: Ambulatory Visit | Attending: Orthopedic Surgery | Admitting: Orthopedic Surgery

## 2017-10-19 ENCOUNTER — Encounter (HOSPITAL_COMMUNITY): Payer: Self-pay

## 2017-10-19 ENCOUNTER — Other Ambulatory Visit: Payer: Self-pay

## 2017-10-19 DIAGNOSIS — Z01812 Encounter for preprocedural laboratory examination: Secondary | ICD-10-CM | POA: Diagnosis present

## 2017-10-19 DIAGNOSIS — M1611 Unilateral primary osteoarthritis, right hip: Secondary | ICD-10-CM | POA: Insufficient documentation

## 2017-10-19 LAB — SURGICAL PCR SCREEN
MRSA, PCR: POSITIVE — AB
Staphylococcus aureus: POSITIVE — AB

## 2017-10-19 NOTE — Patient Instructions (Signed)
[name]  10/19/2017   Your procedure is scheduled on: 10-25-17   Report to [facility] Main  Entrance    Report to admitting at 6:00AM    Call this number if you have problems the morning of surgery [phone]     Remember: Do not eat food or drink liquids :After Midnight.     Take these medicines the morning of surgery with A SIP OF WATER: albuterol inhaler, Symbicort inhaler , fluoxetine, levothyroxine, Claritin, bupropion(wellbutrin), tylenol if needed                                 You may not have any metal on your body including hair pins and              piercings  Do not wear jewelry, make-up, lotions, powders or perfumes, deodorant             Do not wear nail polish.  Do not shave  48 hours prior to surgery.           Do not bring valuables to the hospital. [place].  Contacts, dentures or bridgework may not be worn into surgery.  Leave suitcase in the car. After surgery it may be brought to your room.                Please read over the following fact sheets you were given: _____________________________________________________________________             [facility] - Preparing for Surgery Before surgery, you can play an important role.  Because skin is not sterile, your skin needs to be as free of germs as possible.  You can reduce the number of germs on your skin by washing with CHG (chlorahexidine gluconate) soap before surgery.  CHG is an antiseptic cleaner which kills germs and bonds with the skin to continue killing germs even after washing. Please DO NOT use if you have an allergy to CHG or antibacterial soaps.  If your skin becomes reddened/irritated stop using the CHG and inform your nurse when you arrive at Short Stay. Do not shave (including legs and underarms) for at least 48 hours prior to the first CHG shower.  You may shave your face/neck. Please follow these  instructions carefully:  1.  Shower with CHG Soap the night before surgery and the  morning of Surgery.  2.  If you choose to wash your hair, wash your hair first as usual with your  normal  shampoo.  3.  After you shampoo, rinse your hair and body thoroughly to remove the  shampoo.                           4.  Use CHG as you would any other liquid soap.  You can apply chg directly  to the skin and wash                       Gently with a scrungie or clean washcloth.  5.  Apply the CHG Soap to your body ONLY FROM THE NECK DOWN.   Do not use on face/ open  Wound or open sores. Avoid contact with eyes, ears mouth and genitals (private parts).                       Wash face,  Genitals (private parts) with your normal soap.             6.  Wash thoroughly, paying special attention to the area where your surgery  will be performed.  7.  Thoroughly rinse your body with warm water from the neck down.  8.  DO NOT shower/wash with your normal soap after using and rinsing off  the CHG Soap.                9.  Pat yourself dry with a clean towel.            10.  Wear clean pajamas.            11.  Place clean sheets on your bed the night of your first shower and do not  sleep with pets. Day of Surgery : Do not apply any lotions/deodorants the morning of surgery.  Please wear clean clothes to the hospital/surgery center.  FAILURE TO FOLLOW THESE INSTRUCTIONS MAY RESULT IN THE CANCELLATION OF YOUR SURGERY PATIENT SIGNATURE_________________________________  NURSE SIGNATURE__________________________________  ________________________________________________________________________   Adam Phenix  An incentive spirometer is a tool that can help keep your lungs clear and active. This tool measures how well you are filling your lungs with each breath. Taking long deep breaths may help reverse or decrease the chance of developing breathing (pulmonary) problems (especially  infection) following:  A long period of time when you are unable to move or be active. BEFORE THE PROCEDURE   If the spirometer includes an indicator to show your best effort, your nurse or respiratory therapist will set it to a desired goal.  If possible, sit up straight or lean slightly forward. Try not to slouch.  Hold the incentive spirometer in an upright position. INSTRUCTIONS FOR USE  1. Sit on the edge of your bed if possible, or sit up as far as you can in bed or on a chair. 2. Hold the incentive spirometer in an upright position. 3. Breathe out normally. 4. Place the mouthpiece in your mouth and seal your lips tightly around it. 5. Breathe in slowly and as deeply as possible, raising the piston or the ball toward the top of the column. 6. Hold your breath for 3-5 seconds or for as long as possible. Allow the piston or ball to fall to the bottom of the column. 7. Remove the mouthpiece from your mouth and breathe out normally. 8. Rest for a few seconds and repeat Steps 1 through 7 at least 10 times every 1-2 hours when you are awake. Take your time and take a few normal breaths between deep breaths. 9. The spirometer may include an indicator to show your best effort. Use the indicator as a goal to work toward during each repetition. 10. After each set of 10 deep breaths, practice coughing to be sure your lungs are clear. If you have an incision (the cut made at the time of surgery), support your incision when coughing by placing a pillow or rolled up towels firmly against it. Once you are able to get out of bed, walk around indoors and cough well. You may stop using the incentive spirometer when instructed by your caregiver.  RISKS AND COMPLICATIONS  Take your time so you do not get  dizzy or light-headed.  If you are in pain, you may need to take or ask for pain medication before doing incentive spirometry. It is harder to take a deep breath if you are having pain. AFTER  USE  Rest and breathe slowly and easily.  It can be helpful to keep track of a log of your progress. Your caregiver can provide you with a simple table to help with this. If you are using the spirometer at home, follow these instructions: [place] IF:   You are having difficultly using the spirometer.  You have trouble using the spirometer as often as instructed.  Your pain medication is not giving enough relief while using the spirometer.  You develop fever of 100.5 F (38.1 C) or higher. SEEK IMMEDIATE MEDICAL CARE IF:   You cough up bloody sputum that had not been present before.  You develop fever of 102 F (38.9 C) or greater.  You develop worsening pain at or near the incision site. MAKE SURE YOU:   Understand these instructions.  Will watch your condition.  Will get help right away if you are not doing well or get worse. Document Released: 10/25/2006 Document Revised: 09/06/2011 Document Reviewed: 12/26/2006 ExitCare Patient Information 2014 ExitCare, [place].   ________________________________________________________________________  WHAT IS A BLOOD TRANSFUSION? Blood Transfusion Information  A transfusion is the replacement of blood or some of its parts. Blood is made up of multiple cells which provide different functions.  Red blood cells carry oxygen and are used for blood loss replacement.  White blood cells fight against infection.  Platelets control bleeding.  Plasma helps clot blood.  Other blood products are available for specialized needs, such as hemophilia or other clotting disorders. BEFORE THE TRANSFUSION  Who gives blood for transfusions?   Healthy volunteers who are fully evaluated to make sure their blood is safe. This is blood bank blood. Transfusion therapy is the safest it has ever been in the practice of medicine. Before blood is taken from a donor, a complete history is taken to make sure that person has no history of diseases  nor engages in risky social behavior (examples are intravenous drug use or sexual activity with multiple partners). The donor's travel history is screened to minimize risk of transmitting infections, such as malaria. The donated blood is tested for signs of infectious diseases, such as HIV and hepatitis. The blood is then tested to be sure it is compatible with you in order to minimize the chance of a transfusion reaction. If you or a relative donates blood, this is often done in anticipation of surgery and is not appropriate for emergency situations. It takes many days to process the donated blood. RISKS AND COMPLICATIONS Although transfusion therapy is very safe and saves many lives, the main dangers of transfusion include:   Getting an infectious disease.  Developing a transfusion reaction. This is an allergic reaction to something in the blood you were given. Every precaution is taken to prevent this. The decision to have a blood transfusion has been considered carefully by your caregiver before blood is given. Blood is not given unless the benefits outweigh the risks. AFTER THE TRANSFUSION  Right after receiving a blood transfusion, you will usually feel much better and more energetic. This is especially true if your red blood cells have gotten low (anemic). The transfusion raises the level of the red blood cells which carry oxygen, and this usually causes an energy increase.  The nurse administering the transfusion will  monitor you carefully for complications. HOME CARE INSTRUCTIONS  No special instructions are needed after a transfusion. You may find your energy is better. Speak with your caregiver about any limitations on activity for underlying diseases you may have. SEEK MEDICAL CARE IF:   Your condition is not improving after your transfusion.  You develop redness or irritation at the intravenous (IV) site. SEEK IMMEDIATE MEDICAL CARE IF:  Any of the following symptoms occur over the  next 12 hours:  Shaking chills.  You have a temperature by mouth above 102 F (38.9 C), not controlled by medicine.  Chest, back, or muscle pain.  People around you feel you are not acting correctly or are confused.  Shortness of breath or difficulty breathing.  Dizziness and fainting.  You get a rash or develop hives.  You have a decrease in urine output.  Your urine turns a dark color or changes to pink, red, or brown. Any of the following symptoms occur over the next 10 days:  You have a temperature by mouth above 102 F (38.9 C), not controlled by medicine.  Shortness of breath.  Weakness after normal activity.  The white part of the eye turns yellow (jaundice).  You have a decrease in the amount of urine or are urinating less often.  Your urine turns a dark color or changes to pink, red, or brown. Document Released: 06/11/2000 Document Revised: 09/06/2011 Document Reviewed: 01/29/2008 [facility] Patient Information 2014 [place], [place].  _______________________________________________________________________

## 2017-10-20 ENCOUNTER — Other Ambulatory Visit (INDEPENDENT_AMBULATORY_CARE_PROVIDER_SITE_OTHER): Payer: Medicare HMO

## 2017-10-20 ENCOUNTER — Telehealth: Payer: Self-pay | Admitting: Family Medicine

## 2017-10-20 ENCOUNTER — Telehealth: Payer: Self-pay | Admitting: Adult Health

## 2017-10-20 DIAGNOSIS — E559 Vitamin D deficiency, unspecified: Secondary | ICD-10-CM

## 2017-10-20 DIAGNOSIS — J4520 Mild intermittent asthma, uncomplicated: Secondary | ICD-10-CM

## 2017-10-20 LAB — COMPREHENSIVE METABOLIC PANEL
ALT: 19 U/L (ref 0–35)
AST: 22 U/L (ref 0–37)
Albumin: 4.2 g/dL (ref 3.5–5.2)
Alkaline Phosphatase: 107 U/L (ref 39–117)
BUN: 18 mg/dL (ref 6–23)
CO2: 31 mEq/L (ref 19–32)
Calcium: 9.2 mg/dL (ref 8.4–10.5)
Chloride: 98 mEq/L (ref 96–112)
Creatinine, Ser: 0.99 mg/dL (ref 0.40–1.20)
GFR: 60.69 mL/min (ref >60.00)
Glucose, Bld: 94 mg/dL (ref 70–99)
Potassium: 3.9 mEq/L (ref 3.5–5.1)
Sodium: 138 mEq/L (ref 135–145)
Total Bilirubin: 0.7 mg/dL (ref 0.2–1.2)
Total Protein: 6.8 g/dL (ref 6.0–8.3)

## 2017-10-20 LAB — VITAMIN D 25 HYDROXY (VIT D DEFICIENCY, FRACTURES): VITD: 40.03 ng/mL (ref 30.00–100.00)

## 2017-10-20 MED ORDER — BUDESONIDE-FORMOTEROL FUMARATE 80-4.5 MCG/ACT IN AERO: 2.0000 | INHALATION_SPRAY | Freq: Two times a day (BID) | RESPIRATORY_TRACT | 5 refills | Status: DC | PRN

## 2017-10-20 NOTE — Telephone Encounter (Signed)
Nope unless we did it when she was here. Couple weeks ago. I did not have any outstanding paperwork when I left

## 2017-10-20 NOTE — Telephone Encounter (Signed)
Copied from [place] [id number]. Topic: Quick Communication - See Telephone Encounter >> Oct 20, 2017 10:28 AM Corie Chiquito, NT wrote: CRM for notification. See Telephone encounter for: 10/20/17. Patient is calling because she is due to have surgery next Tuesday for her hip replacement, but she was told by the hospital pre-op that they never received her surgical clearance. Stated that she had her appointment with Dr.Blyth on 09-30-2017 for the surgical clearance. Patient needs the paper work to be sent to the [facility] so it want hold up her surgery. Patient would like a call back when this is taken care of so she doesn't have to worry about it anymore she can be reached at [phone]

## 2017-10-20 NOTE — Telephone Encounter (Signed)
Spoke with pt and advised rx sent to pharmacy. Nothing further is needed.   

## 2017-10-20 NOTE — Telephone Encounter (Signed)
Copied from [place] [phone]. Topic: Quick Communication - Rx Refill/Question >> Oct 20, 2017 10:24 AM Corie Chiquito, [place] wrote: Medication:Wellbutrin 300mg  Has the patient contacted their pharmacy? Yes Patient is call because she needs a refill on the above medication. Preferred Pharmacy (with phone number or street name):[facility] [phone] Agent: Please be advised that RX refills may take up to 3 business days. We ask that you follow-up with your pharmacy.

## 2017-10-20 NOTE — Telephone Encounter (Signed)
Have you seen her surgical clearance form? I have not

## 2017-10-21 MED ORDER — BUPROPION HCL ER (XL) 300 MG PO TB24: 300.0000 mg | ORAL_TABLET | Freq: Every day | ORAL | 1 refills | Status: DC

## 2017-10-21 NOTE — Telephone Encounter (Signed)
Refill for bupropion 300 mg 24 hr tab LR  07/21/17 for #90 tabs and 1 refill LOV  041/05/19 NOV  12/13/17 Provider  S. [place]

## 2017-10-24 ENCOUNTER — Ambulatory Visit: Payer: Self-pay | Admitting: Family Medicine

## 2017-10-24 MED ORDER — GENTAMICIN SULFATE 40 MG/ML IJ SOLN
5.0000 mg/kg | Freq: Once | INTRAVENOUS | Status: AC
  Administered 2017-10-25: 330 mg via INTRAVENOUS
  Filled 2017-10-24: qty 8.25

## 2017-10-24 MED ORDER — TRANEXAMIC ACID 1000 MG/10ML IV SOLN
1000.0000 mg | INTRAVENOUS | Status: AC
  Administered 2017-10-25: 1000 mg via INTRAVENOUS
  Filled 2017-10-24: qty 1100

## 2017-10-24 NOTE — Telephone Encounter (Signed)
Patient was in for Surgical Clearance on 09/30/17, [she received clearance from Cardiology on 09/28/17 and from Pulmonology on 10/11/17]; all appointments were for surgical clearance/SLS 04/29

## 2017-10-24 NOTE — Telephone Encounter (Signed)
Spoke with patient, she states she called the office [facility] she stated they told her they had everything they needed in order for her surgery to go. No form needed at this time

## 2017-10-24 NOTE — Telephone Encounter (Signed)
My note from 09/30/2017 speaks to she is cleared so call the ortho and see if they got our form or if they need to fax over another form and you can fax them my note

## 2017-10-24 NOTE — Telephone Encounter (Signed)
Called left message with Clarise Cruz at [facility] to have form faxed over. Also gave surgical clearance to have this done as well.

## 2017-10-25 ENCOUNTER — Inpatient Hospital Stay (HOSPITAL_COMMUNITY): Payer: Medicare HMO | Admitting: Registered Nurse

## 2017-10-25 ENCOUNTER — Inpatient Hospital Stay (HOSPITAL_COMMUNITY): Payer: Medicare HMO

## 2017-10-25 ENCOUNTER — Encounter (HOSPITAL_COMMUNITY): Payer: Self-pay | Admitting: Emergency Medicine

## 2017-10-25 ENCOUNTER — Encounter (HOSPITAL_COMMUNITY)
Admission: RE | Disposition: A | Discharge status: Home / Self Care | Payer: Self-pay | Source: Ambulatory Visit | Attending: Orthopedic Surgery

## 2017-10-25 ENCOUNTER — Inpatient Hospital Stay (HOSPITAL_COMMUNITY)
Admission: RE | Admit: 2017-10-25 | Discharge: 2017-10-26 | DRG: 470 | Disposition: A | Discharge status: Home / Self Care | Payer: Medicare HMO | Source: Ambulatory Visit | Attending: Orthopedic Surgery | Admitting: Orthopedic Surgery

## 2017-10-25 ENCOUNTER — Other Ambulatory Visit: Payer: Self-pay

## 2017-10-25 DIAGNOSIS — M26609 Unspecified temporomandibular joint disorder, unspecified side: Secondary | ICD-10-CM | POA: Diagnosis present

## 2017-10-25 DIAGNOSIS — F418 Other specified anxiety disorders: Secondary | ICD-10-CM | POA: Diagnosis present

## 2017-10-25 DIAGNOSIS — Z96641 Presence of right artificial hip joint: Secondary | ICD-10-CM | POA: Diagnosis not present

## 2017-10-25 DIAGNOSIS — M160 Bilateral primary osteoarthritis of hip: Secondary | ICD-10-CM | POA: Diagnosis not present

## 2017-10-25 DIAGNOSIS — Z170 Estrogen receptor positive status [ER+]: Secondary | ICD-10-CM | POA: Diagnosis not present

## 2017-10-25 DIAGNOSIS — Z803 Family history of malignant neoplasm of breast: Secondary | ICD-10-CM

## 2017-10-25 DIAGNOSIS — M1611 Unilateral primary osteoarthritis, right hip: Secondary | ICD-10-CM | POA: Diagnosis not present

## 2017-10-25 DIAGNOSIS — I2510 Atherosclerotic heart disease of native coronary artery without angina pectoris: Secondary | ICD-10-CM | POA: Diagnosis present

## 2017-10-25 DIAGNOSIS — K219 Gastro-esophageal reflux disease without esophagitis: Secondary | ICD-10-CM | POA: Diagnosis not present

## 2017-10-25 DIAGNOSIS — T451X5A Adverse effect of antineoplastic and immunosuppressive drugs, initial encounter: Secondary | ICD-10-CM | POA: Diagnosis present

## 2017-10-25 DIAGNOSIS — K449 Diaphragmatic hernia without obstruction or gangrene: Secondary | ICD-10-CM | POA: Diagnosis present

## 2017-10-25 DIAGNOSIS — Z8349 Family history of other endocrine, nutritional and metabolic diseases: Secondary | ICD-10-CM

## 2017-10-25 DIAGNOSIS — Z9221 Personal history of antineoplastic chemotherapy: Secondary | ICD-10-CM | POA: Diagnosis not present

## 2017-10-25 DIAGNOSIS — E559 Vitamin D deficiency, unspecified: Secondary | ICD-10-CM | POA: Diagnosis present

## 2017-10-25 DIAGNOSIS — Z8261 Family history of arthritis: Secondary | ICD-10-CM

## 2017-10-25 DIAGNOSIS — F4310 Post-traumatic stress disorder, unspecified: Secondary | ICD-10-CM | POA: Diagnosis present

## 2017-10-25 DIAGNOSIS — L719 Rosacea, unspecified: Secondary | ICD-10-CM | POA: Diagnosis present

## 2017-10-25 DIAGNOSIS — Z833 Family history of diabetes mellitus: Secondary | ICD-10-CM

## 2017-10-25 DIAGNOSIS — I427 Cardiomyopathy due to drug and external agent: Secondary | ICD-10-CM | POA: Diagnosis not present

## 2017-10-25 DIAGNOSIS — Z853 Personal history of malignant neoplasm of breast: Secondary | ICD-10-CM

## 2017-10-25 DIAGNOSIS — Z79890 Hormone replacement therapy (postmenopausal): Secondary | ICD-10-CM | POA: Diagnosis not present

## 2017-10-25 DIAGNOSIS — Q261 Persistent left superior vena cava: Secondary | ICD-10-CM | POA: Diagnosis not present

## 2017-10-25 DIAGNOSIS — Z818 Family history of other mental and behavioral disorders: Secondary | ICD-10-CM

## 2017-10-25 DIAGNOSIS — E663 Overweight: Secondary | ICD-10-CM | POA: Diagnosis present

## 2017-10-25 DIAGNOSIS — Z6825 Body mass index (BMI) 25.0-25.9, adult: Secondary | ICD-10-CM

## 2017-10-25 DIAGNOSIS — J45909 Unspecified asthma, uncomplicated: Secondary | ICD-10-CM | POA: Diagnosis present

## 2017-10-25 DIAGNOSIS — Z811 Family history of alcohol abuse and dependence: Secondary | ICD-10-CM

## 2017-10-25 DIAGNOSIS — Z885 Allergy status to narcotic agent status: Secondary | ICD-10-CM

## 2017-10-25 DIAGNOSIS — M19072 Primary osteoarthritis, left ankle and foot: Secondary | ICD-10-CM | POA: Diagnosis present

## 2017-10-25 DIAGNOSIS — G629 Polyneuropathy, unspecified: Secondary | ICD-10-CM | POA: Diagnosis present

## 2017-10-25 DIAGNOSIS — M19071 Primary osteoarthritis, right ankle and foot: Secondary | ICD-10-CM | POA: Diagnosis present

## 2017-10-25 DIAGNOSIS — M170 Bilateral primary osteoarthritis of knee: Secondary | ICD-10-CM | POA: Diagnosis present

## 2017-10-25 DIAGNOSIS — Z86010 Personal history of colonic polyps: Secondary | ICD-10-CM

## 2017-10-25 DIAGNOSIS — Z96649 Presence of unspecified artificial hip joint: Secondary | ICD-10-CM

## 2017-10-25 DIAGNOSIS — Z9481 Bone marrow transplant status: Secondary | ICD-10-CM | POA: Diagnosis not present

## 2017-10-25 DIAGNOSIS — Z881 Allergy status to other antibiotic agents status: Secondary | ICD-10-CM

## 2017-10-25 DIAGNOSIS — E039 Hypothyroidism, unspecified: Secondary | ICD-10-CM | POA: Diagnosis present

## 2017-10-25 DIAGNOSIS — Z923 Personal history of irradiation: Secondary | ICD-10-CM | POA: Diagnosis not present

## 2017-10-25 DIAGNOSIS — M25551 Pain in right hip: Secondary | ICD-10-CM | POA: Diagnosis present

## 2017-10-25 DIAGNOSIS — Z888 Allergy status to other drugs, medicaments and biological substances status: Secondary | ICD-10-CM

## 2017-10-25 DIAGNOSIS — Z8249 Family history of ischemic heart disease and other diseases of the circulatory system: Secondary | ICD-10-CM

## 2017-10-25 DIAGNOSIS — Z825 Family history of asthma and other chronic lower respiratory diseases: Secondary | ICD-10-CM

## 2017-10-25 DIAGNOSIS — Z823 Family history of stroke: Secondary | ICD-10-CM

## 2017-10-25 DIAGNOSIS — E785 Hyperlipidemia, unspecified: Secondary | ICD-10-CM | POA: Diagnosis present

## 2017-10-25 DIAGNOSIS — H60549 Acute eczematoid otitis externa, unspecified ear: Secondary | ICD-10-CM | POA: Diagnosis not present

## 2017-10-25 DIAGNOSIS — Z471 Aftercare following joint replacement surgery: Secondary | ICD-10-CM | POA: Diagnosis not present

## 2017-10-25 DIAGNOSIS — Z7951 Long term (current) use of inhaled steroids: Secondary | ICD-10-CM

## 2017-10-25 HISTORY — PX: TOTAL HIP ARTHROPLASTY: SHX124

## 2017-10-25 LAB — TYPE AND SCREEN
ABO/RH(D): A POS
Antibody Screen: NEGATIVE

## 2017-10-25 SURGERY — ARTHROPLASTY, HIP, TOTAL, ANTERIOR APPROACH
Anesthesia: Spinal | Site: Hip | Laterality: Right

## 2017-10-25 MED ORDER — LORATADINE 10 MG PO TABS
10.0000 mg | ORAL_TABLET | Freq: Every day | ORAL | Status: DC
  Administered 2017-10-26: 10 mg via ORAL
  Filled 2017-10-25: qty 1

## 2017-10-25 MED ORDER — PHENYLEPHRINE 40 MCG/ML (10ML) SYRINGE FOR IV PUSH (FOR BLOOD PRESSURE SUPPORT)
PREFILLED_SYRINGE | INTRAVENOUS | Status: AC
Start: 2017-10-25 — End: 2017-10-25
  Filled 2017-10-25: qty 10

## 2017-10-25 MED ORDER — PHENYLEPHRINE HCL 10 MG/ML IJ SOLN
INTRAMUSCULAR | Status: DC | PRN
  Administered 2017-10-25 (×2): 80 ug via INTRAVENOUS

## 2017-10-25 MED ORDER — ALBUTEROL SULFATE HFA 108 (90 BASE) MCG/ACT IN AERS: 2.0000 | INHALATION_SPRAY | Freq: Four times a day (QID) | RESPIRATORY_TRACT | Status: DC | PRN

## 2017-10-25 MED ORDER — BUPIVACAINE IN DEXTROSE 0.75-8.25 % IT SOLN
INTRATHECAL | Status: DC | PRN
  Administered 2017-10-25: 2 mL via INTRATHECAL

## 2017-10-25 MED ORDER — CELECOXIB 200 MG PO CAPS
200.0000 mg | ORAL_CAPSULE | Freq: Two times a day (BID) | ORAL | Status: DC
  Administered 2017-10-25 – 2017-10-26 (×3): 200 mg via ORAL
  Filled 2017-10-25 (×4): qty 1

## 2017-10-25 MED ORDER — POLYETHYLENE GLYCOL 3350 17 G PO PACK: 17.0000 g | PACK | Freq: Two times a day (BID) | ORAL | 0 refills | Status: DC

## 2017-10-25 MED ORDER — PHENYLEPHRINE HCL 10 MG/ML IJ SOLN
INTRAMUSCULAR | Status: AC
  Filled 2017-10-25: qty 1

## 2017-10-25 MED ORDER — VANCOMYCIN HCL IN DEXTROSE 1-5 GM/200ML-% IV SOLN
1000.0000 mg | INTRAVENOUS | Status: AC
  Administered 2017-10-25: 1000 mg via INTRAVENOUS
  Filled 2017-10-25: qty 200

## 2017-10-25 MED ORDER — MOMETASONE FURO-FORMOTEROL FUM 100-5 MCG/ACT IN AERO
2.0000 | INHALATION_SPRAY | Freq: Two times a day (BID) | RESPIRATORY_TRACT | Status: DC | PRN
Start: 2017-10-25 — End: 2017-10-26

## 2017-10-25 MED ORDER — DIPHENHYDRAMINE HCL 12.5 MG/5ML PO ELIX
12.5000 mg | ORAL_SOLUTION | ORAL | Status: DC | PRN
  Administered 2017-10-25: 12.5 mg via ORAL
  Administered 2017-10-25 – 2017-10-26 (×6): 25 mg via ORAL
  Filled 2017-10-25 (×7): qty 10

## 2017-10-25 MED ORDER — LEVOTHYROXINE SODIUM 75 MCG PO TABS
75.0000 ug | ORAL_TABLET | Freq: Every day | ORAL | Status: DC
  Administered 2017-10-26: 75 ug via ORAL
  Filled 2017-10-25: qty 1

## 2017-10-25 MED ORDER — PHENYLEPHRINE HCL 10 MG/ML IJ SOLN
INTRAVENOUS | Status: DC | PRN
  Administered 2017-10-25: 25 ug/min via INTRAVENOUS

## 2017-10-25 MED ORDER — POLYETHYLENE GLYCOL 3350 17 G PO PACK
17.0000 g | PACK | Freq: Two times a day (BID) | ORAL | Status: DC
  Administered 2017-10-25 – 2017-10-26 (×2): 17 g via ORAL
  Filled 2017-10-25 (×2): qty 1

## 2017-10-25 MED ORDER — ASPIRIN 81 MG PO CHEW: 81.0000 mg | CHEWABLE_TABLET | Freq: Two times a day (BID) | ORAL | 0 refills | Status: AC

## 2017-10-25 MED ORDER — PROPOFOL 10 MG/ML IV BOLUS
INTRAVENOUS | Status: AC
Start: 2017-10-25 — End: 2017-10-25
  Filled 2017-10-25: qty 20

## 2017-10-25 MED ORDER — PROPOFOL 10 MG/ML IV BOLUS
INTRAVENOUS | Status: AC
  Filled 2017-10-25: qty 60

## 2017-10-25 MED ORDER — ONDANSETRON HCL 4 MG/2ML IJ SOLN: 4.0000 mg | Freq: Four times a day (QID) | INTRAMUSCULAR | Status: DC | PRN

## 2017-10-25 MED ORDER — DOCUSATE SODIUM 100 MG PO CAPS
100.0000 mg | ORAL_CAPSULE | Freq: Two times a day (BID) | ORAL | Status: DC
  Administered 2017-10-25 – 2017-10-26 (×2): 100 mg via ORAL
  Filled 2017-10-25 (×2): qty 1

## 2017-10-25 MED ORDER — DEXAMETHASONE SODIUM PHOSPHATE 10 MG/ML IJ SOLN
10.0000 mg | Freq: Once | INTRAMUSCULAR | Status: AC
  Administered 2017-10-25: 10 mg via INTRAVENOUS

## 2017-10-25 MED ORDER — ACETAMINOPHEN 500 MG PO TABS: 1000.0000 mg | ORAL_TABLET | Freq: Three times a day (TID) | ORAL | 0 refills | Status: DC

## 2017-10-25 MED ORDER — PHENOL 1.4 % MT LIQD: 1.0000 | OROMUCOSAL | Status: DC | PRN

## 2017-10-25 MED ORDER — METOCLOPRAMIDE HCL 5 MG/ML IJ SOLN: 5.0000 mg | Freq: Three times a day (TID) | INTRAMUSCULAR | Status: DC | PRN

## 2017-10-25 MED ORDER — OXYCODONE HCL 5 MG PO TABS: 5.0000 mg | ORAL_TABLET | ORAL | 0 refills | Status: DC | PRN

## 2017-10-25 MED ORDER — METHOCARBAMOL 1000 MG/10ML IJ SOLN
500.0000 mg | Freq: Four times a day (QID) | INTRAMUSCULAR | Status: DC | PRN
  Administered 2017-10-25: 500 mg via INTRAVENOUS
  Filled 2017-10-25: qty 550

## 2017-10-25 MED ORDER — STERILE WATER FOR IRRIGATION IR SOLN
Status: DC | PRN
  Administered 2017-10-25: 2000 mL

## 2017-10-25 MED ORDER — OXYCODONE HCL 5 MG PO TABS
10.0000 mg | ORAL_TABLET | ORAL | Status: DC | PRN
  Administered 2017-10-25 – 2017-10-26 (×6): 10 mg via ORAL
  Filled 2017-10-25 (×6): qty 2

## 2017-10-25 MED ORDER — FENTANYL CITRATE (PF) 100 MCG/2ML IJ SOLN: 25.0000 ug | INTRAMUSCULAR | Status: DC | PRN

## 2017-10-25 MED ORDER — SODIUM CHLORIDE 0.9 % IV SOLN
INTRAVENOUS | Status: DC
  Administered 2017-10-25: 13:00:00 via INTRAVENOUS

## 2017-10-25 MED ORDER — FENTANYL CITRATE (PF) 100 MCG/2ML IJ SOLN
INTRAMUSCULAR | Status: DC | PRN
  Administered 2017-10-25 (×2): 50 ug via INTRAVENOUS

## 2017-10-25 MED ORDER — METHOCARBAMOL 500 MG PO TABS
500.0000 mg | ORAL_TABLET | Freq: Four times a day (QID) | ORAL | Status: DC | PRN
  Administered 2017-10-25 – 2017-10-26 (×4): 500 mg via ORAL
  Filled 2017-10-25 (×6): qty 1

## 2017-10-25 MED ORDER — MEPERIDINE HCL 50 MG/ML IJ SOLN: 6.2500 mg | INTRAMUSCULAR | Status: DC | PRN

## 2017-10-25 MED ORDER — FERROUS SULFATE 325 (65 FE) MG PO TABS: 325.0000 mg | ORAL_TABLET | Freq: Three times a day (TID) | ORAL | 3 refills | Status: DC

## 2017-10-25 MED ORDER — FENTANYL CITRATE (PF) 100 MCG/2ML IJ SOLN
INTRAMUSCULAR | Status: AC
  Filled 2017-10-25: qty 2

## 2017-10-25 MED ORDER — FLUOXETINE HCL 20 MG/5ML PO SOLN
5.0000 mg | Freq: Every day | ORAL | Status: DC
  Administered 2017-10-26: 5 mg via ORAL
  Filled 2017-10-25: qty 1.3

## 2017-10-25 MED ORDER — ALBUTEROL SULFATE (2.5 MG/3ML) 0.083% IN NEBU: 2.5000 mg | INHALATION_SOLUTION | Freq: Four times a day (QID) | RESPIRATORY_TRACT | Status: DC | PRN

## 2017-10-25 MED ORDER — VANCOMYCIN HCL IN DEXTROSE 1-5 GM/200ML-% IV SOLN
1000.0000 mg | Freq: Two times a day (BID) | INTRAVENOUS | Status: AC
  Administered 2017-10-25: 1000 mg via INTRAVENOUS
  Filled 2017-10-25: qty 200

## 2017-10-25 MED ORDER — FUROSEMIDE 40 MG PO TABS: 40.0000 mg | ORAL_TABLET | Freq: Every day | ORAL | Status: DC | PRN

## 2017-10-25 MED ORDER — DEXAMETHASONE SODIUM PHOSPHATE 10 MG/ML IJ SOLN
10.0000 mg | Freq: Once | INTRAMUSCULAR | Status: AC
  Administered 2017-10-26: 10 mg via INTRAVENOUS
  Filled 2017-10-25: qty 1

## 2017-10-25 MED ORDER — METOCLOPRAMIDE HCL 5 MG PO TABS: 5.0000 mg | ORAL_TABLET | Freq: Three times a day (TID) | ORAL | Status: DC | PRN

## 2017-10-25 MED ORDER — METHOCARBAMOL 500 MG PO TABS: 500.0000 mg | ORAL_TABLET | Freq: Four times a day (QID) | ORAL | 0 refills | Status: DC | PRN

## 2017-10-25 MED ORDER — LACTATED RINGERS IV SOLN
INTRAVENOUS | Status: DC
Start: 2017-10-25 — End: 2017-10-25
  Administered 2017-10-25 (×4): via INTRAVENOUS

## 2017-10-25 MED ORDER — BUPROPION HCL ER (XL) 300 MG PO TB24
300.0000 mg | ORAL_TABLET | Freq: Every day | ORAL | Status: DC
  Administered 2017-10-26: 300 mg via ORAL
  Filled 2017-10-25: qty 1

## 2017-10-25 MED ORDER — ALUM & MAG HYDROXIDE-SIMETH 200-200-20 MG/5ML PO SUSP: 15.0000 mL | ORAL | Status: DC | PRN

## 2017-10-25 MED ORDER — OXYCODONE HCL 5 MG PO TABS
5.0000 mg | ORAL_TABLET | ORAL | Status: DC | PRN
  Administered 2017-10-25: 5 mg via ORAL
  Filled 2017-10-25: qty 1

## 2017-10-25 MED ORDER — ONDANSETRON HCL 4 MG PO TABS: 4.0000 mg | ORAL_TABLET | Freq: Four times a day (QID) | ORAL | Status: DC | PRN

## 2017-10-25 MED ORDER — PROPOFOL 10 MG/ML IV BOLUS
INTRAVENOUS | Status: AC
  Filled 2017-10-25: qty 20

## 2017-10-25 MED ORDER — MIDAZOLAM HCL 2 MG/2ML IJ SOLN
INTRAMUSCULAR | Status: AC
  Filled 2017-10-25: qty 2

## 2017-10-25 MED ORDER — ASPIRIN 81 MG PO CHEW
81.0000 mg | CHEWABLE_TABLET | Freq: Two times a day (BID) | ORAL | Status: DC
  Administered 2017-10-25 – 2017-10-26 (×2): 81 mg via ORAL
  Filled 2017-10-25 (×2): qty 1

## 2017-10-25 MED ORDER — MAGNESIUM CITRATE PO SOLN: 1.0000 | Freq: Once | ORAL | Status: DC | PRN

## 2017-10-25 MED ORDER — FLUTICASONE PROPIONATE 50 MCG/ACT NA SUSP: 2.0000 | Freq: Every day | NASAL | Status: DC | PRN

## 2017-10-25 MED ORDER — SODIUM CHLORIDE 0.9 % IR SOLN
Status: DC | PRN
  Administered 2017-10-25: 1000 mL

## 2017-10-25 MED ORDER — HYDROMORPHONE HCL 1 MG/ML IJ SOLN
INTRAMUSCULAR | Status: AC
  Filled 2017-10-25: qty 2

## 2017-10-25 MED ORDER — PROPOFOL 500 MG/50ML IV EMUL
INTRAVENOUS | Status: DC | PRN
  Administered 2017-10-25: 75 ug/kg/min via INTRAVENOUS

## 2017-10-25 MED ORDER — MIDAZOLAM HCL 5 MG/5ML IJ SOLN
INTRAMUSCULAR | Status: DC | PRN
  Administered 2017-10-25: 1 mg via INTRAVENOUS
  Administered 2017-10-25: 2 mg via INTRAVENOUS

## 2017-10-25 MED ORDER — DOCUSATE SODIUM 100 MG PO CAPS: 100.0000 mg | ORAL_CAPSULE | Freq: Two times a day (BID) | ORAL | 0 refills | Status: DC

## 2017-10-25 MED ORDER — PROPOFOL 10 MG/ML IV BOLUS
INTRAVENOUS | Status: DC | PRN
  Administered 2017-10-25: 30 mg via INTRAVENOUS

## 2017-10-25 MED ORDER — HYDROMORPHONE HCL 1 MG/ML IJ SOLN
0.2500 mg | INTRAMUSCULAR | Status: DC | PRN
  Administered 2017-10-25 (×4): 0.5 mg via INTRAVENOUS

## 2017-10-25 MED ORDER — DEXAMETHASONE SODIUM PHOSPHATE 10 MG/ML IJ SOLN
INTRAMUSCULAR | Status: AC
  Filled 2017-10-25: qty 1

## 2017-10-25 MED ORDER — FERROUS SULFATE 325 (65 FE) MG PO TABS
325.0000 mg | ORAL_TABLET | Freq: Three times a day (TID) | ORAL | Status: DC
  Administered 2017-10-25 – 2017-10-26 (×4): 325 mg via ORAL
  Filled 2017-10-25 (×4): qty 1

## 2017-10-25 MED ORDER — ACETAMINOPHEN 500 MG PO TABS
1000.0000 mg | ORAL_TABLET | Freq: Four times a day (QID) | ORAL | Status: AC
  Administered 2017-10-25 – 2017-10-26 (×4): 1000 mg via ORAL
  Filled 2017-10-25 (×4): qty 2

## 2017-10-25 MED ORDER — MENTHOL 3 MG MT LOZG: 1.0000 | LOZENGE | OROMUCOSAL | Status: DC | PRN

## 2017-10-25 MED ORDER — CHLORHEXIDINE GLUCONATE 4 % EX LIQD: 60.0000 mL | Freq: Once | CUTANEOUS | Status: DC

## 2017-10-25 MED ORDER — TRANEXAMIC ACID 1000 MG/10ML IV SOLN
1000.0000 mg | Freq: Once | INTRAVENOUS | Status: AC
  Administered 2017-10-25: 1000 mg via INTRAVENOUS
  Filled 2017-10-25: qty 1100

## 2017-10-25 MED ORDER — BISACODYL 10 MG RE SUPP: 10.0000 mg | Freq: Every day | RECTAL | Status: DC | PRN

## 2017-10-25 MED ORDER — ONDANSETRON HCL 4 MG/2ML IJ SOLN: 4.0000 mg | Freq: Once | INTRAMUSCULAR | Status: DC | PRN

## 2017-10-25 SURGICAL SUPPLY — 38 items
ADH SKN CLS APL DERMABOND .7 (GAUZE/BANDAGES/DRESSINGS) ×1
BAG DECANTER FOR FLEXI CONT (MISCELLANEOUS) IMPLANT
BAG SPEC THK2 15X12 ZIP CLS (MISCELLANEOUS)
BAG ZIPLOCK 12X15 (MISCELLANEOUS) IMPLANT
BLADE SAG 18X100X1.27 (BLADE) ×2 IMPLANT
CAPT HIP TOTAL 2 ×2 IMPLANT
CLOTH BEACON ORANGE TIMEOUT ST (SAFETY) ×2 IMPLANT
COVER PERINEAL POST (MISCELLANEOUS) ×2 IMPLANT
COVER SURGICAL LIGHT HANDLE (MISCELLANEOUS) ×2 IMPLANT
DERMABOND ADVANCED (GAUZE/BANDAGES/DRESSINGS) ×1
DERMABOND ADVANCED .7 DNX12 (GAUZE/BANDAGES/DRESSINGS) ×1 IMPLANT
DRAPE STERI IOBAN 125X83 (DRAPES) ×2 IMPLANT
DRAPE U-SHAPE 47X51 STRL (DRAPES) ×4 IMPLANT
DRESSING AQUACEL AG SP 3.5X10 (GAUZE/BANDAGES/DRESSINGS) ×1 IMPLANT
DRSG AQUACEL AG SP 3.5X10 (GAUZE/BANDAGES/DRESSINGS) ×2
DURAPREP 26ML APPLICATOR (WOUND CARE) ×2 IMPLANT
ELECT REM PT RETURN 15FT ADLT (MISCELLANEOUS) ×2 IMPLANT
GLOVE BIOGEL M STRL SZ7.5 (GLOVE) ×4 IMPLANT
GLOVE BIOGEL PI IND STRL 7.5 (GLOVE) ×7 IMPLANT
GLOVE BIOGEL PI IND STRL 8.5 (GLOVE) ×1 IMPLANT
GLOVE BIOGEL PI INDICATOR 7.5 (GLOVE) ×7
GLOVE BIOGEL PI INDICATOR 8.5 (GLOVE)
GLOVE ECLIPSE 8.0 STRL XLNG CF (GLOVE) IMPLANT
GLOVE ORTHO TXT STRL SZ7.5 (GLOVE) ×2 IMPLANT
GOWN STRL REUS W/TWL 2XL LVL3 (GOWN DISPOSABLE) ×6 IMPLANT
GOWN STRL REUS W/TWL LRG LVL3 (GOWN DISPOSABLE) ×4 IMPLANT
HOLDER FOLEY CATH W/STRAP (MISCELLANEOUS) ×2 IMPLANT
PACK ANTERIOR HIP CUSTOM (KITS) ×2 IMPLANT
SUT MNCRL AB 4-0 PS2 18 (SUTURE) ×2 IMPLANT
SUT STRATAFIX 0 PDS 27 VIOLET (SUTURE) ×2
SUT VIC AB 1 CT1 36 (SUTURE) ×6 IMPLANT
SUT VIC AB 2-0 CT1 27 (SUTURE) ×4
SUT VIC AB 2-0 CT1 TAPERPNT 27 (SUTURE) ×2 IMPLANT
SUTURE STRATFX 0 PDS 27 VIOLET (SUTURE) ×1 IMPLANT
TRAY FOLEY CATH 14FR (SET/KITS/TRAYS/PACK) ×2 IMPLANT
TRAY FOLEY W/METER SILVER 16FR (SET/KITS/TRAYS/PACK) IMPLANT
WATER STERILE IRR 1000ML POUR (IV SOLUTION) ×4 IMPLANT
YANKAUER SUCT BULB TIP 10FT TU (MISCELLANEOUS) IMPLANT

## 2017-10-25 NOTE — Anesthesia Procedure Notes (Signed)
Procedure Name: MAC Date/Time: 10/25/2017 8:39 AM Performed by: Lissa Morales, CRNA Pre-anesthesia Checklist: Patient identified, Emergency Drugs available, Suction available and Patient being monitored Oxygen Delivery Method: Simple face mask Placement Confirmation: positive ETCO2

## 2017-10-25 NOTE — Interval H&P Note (Signed)
History and Physical Interval Note:  10/25/2017 7:04 AM  [name]  has presented today for surgery, with the diagnosis of Right hip osteoarthritis  The various methods of treatment have been discussed with the patient and family. After consideration of risks, benefits and other options for treatment, the patient has consented to  Procedure(s) with comments: RIGHT TOTAL HIP ARTHROPLASTY ANTERIOR APPROACH (Right) - 70 mins as a surgical intervention .  The patient's history has been reviewed, patient examined, no change in status, stable for surgery.  I have reviewed the patient's chart and labs.  Questions were answered to the patient's satisfaction.     Mauri Pole

## 2017-10-25 NOTE — Anesthesia Procedure Notes (Signed)
Spinal  Patient location during procedure: OR End time: 10/25/2017 8:47 AM Staffing Resident/CRNA: Lissa Morales, CRNA Performed: resident/CRNA  Preanesthetic Checklist Completed: patient identified, site marked, surgical consent, pre-op evaluation, timeout performed, IV checked, risks and benefits discussed and monitors and equipment checked Spinal Block Patient position: sitting Prep: DuraPrep Patient monitoring: heart rate, continuous pulse ox, blood pressure and cardiac monitor Approach: midline Location: L3-4 Injection technique: single-shot Needle Needle type: Pencan  Needle gauge: 24 G Needle length: 9 cm Assessment Sensory level: T6 Additional Notes Expiration date of kit checked and confirmed. Patient tolerated procedure well, without complications.

## 2017-10-25 NOTE — Discharge Instructions (Signed)

## 2017-10-25 NOTE — Anesthesia Preprocedure Evaluation (Signed)
Anesthesia Evaluation  Patient identified by MRN, date of birth, ID band Patient awake    Reviewed: Allergy & Precautions, NPO status , Patient's Chart, lab work & pertinent test results  Airway Mallampati: I  TM Distance: >3 FB Neck ROM: Full    Dental   Pulmonary    Pulmonary exam normal        Cardiovascular Normal cardiovascular exam  Non-Ischemic CM EF 50% 01/2013   Neuro/Psych Anxiety    GI/Hepatic GERD  Medicated and Controlled,  Endo/Other    Renal/GU      Musculoskeletal   Abdominal   Peds  Hematology   Anesthesia Other Findings   Reproductive/Obstetrics                             Anesthesia Physical Anesthesia Plan  ASA: III  Anesthesia Plan: Spinal   Post-op Pain Management:    Induction:   PONV Risk Score and Plan: 2 and Ondansetron and Treatment may vary due to age or medical condition  Airway Management Planned: Simple Face Mask  Additional Equipment:   Intra-op Plan:   Post-operative Plan:   Informed Consent: I have reviewed the patients History and Physical, chart, labs and discussed the procedure including the risks, benefits and alternatives for the proposed anesthesia with the patient or authorized representative who has indicated his/her understanding and acceptance.     Plan Discussed with: CRNA and Surgeon  Anesthesia Plan Comments:         Anesthesia Quick Evaluation

## 2017-10-25 NOTE — Transfer of Care (Signed)
Immediate Anesthesia Transfer of Care Note  Patient: [name]  Procedure(s) Performed: RIGHT TOTAL HIP ARTHROPLASTY ANTERIOR APPROACH (Right Hip)  Patient Location: PACU  Anesthesia Type:Spinal  Level of Consciousness: sedated and patient cooperative  Airway & Oxygen Therapy: Patient Spontanous Breathing and Patient connected to face mask oxygen  Post-op Assessment: Report given to RN and Post -op Vital signs reviewed and stable  Post vital signs: stable  Last Vitals:  Vitals Value Taken Time  BP 76/43 10/25/2017 10:41 AM  Temp    Pulse 44 10/25/2017 10:45 AM  Resp 13 10/25/2017 10:45 AM  SpO2 100 % 10/25/2017 10:45 AM  Vitals shown include unvalidated device data.  Last Pain:  Vitals:   10/25/17 0648  TempSrc:   PainSc: 8       Patients Stated Pain Goal: 4 (66/29/47 6546)  Complications: No apparent anesthesia complications

## 2017-10-25 NOTE — Anesthesia Postprocedure Evaluation (Signed)
Anesthesia Post Note  Patient: [name]  Procedure(s) Performed: RIGHT TOTAL HIP ARTHROPLASTY ANTERIOR APPROACH (Right Hip)     Patient location during evaluation: PACU Anesthesia Type: Spinal Level of consciousness: oriented and awake and alert Pain management: pain level controlled Vital Signs Assessment: post-procedure vital signs reviewed and stable Respiratory status: spontaneous breathing, respiratory function stable and patient connected to nasal cannula oxygen Cardiovascular status: blood pressure returned to baseline and stable Postop Assessment: no headache, no backache and no apparent nausea or vomiting Anesthetic complications: no    Last Vitals:  Vitals:   10/25/17 1207 10/25/17 1313  BP: 135/72 120/64  Pulse: 69 67  Resp: 12 16  Temp: (!) 36.4 C 36.6 C  SpO2: 100% 100%    Last Pain:  Vitals:   10/25/17 1313  TempSrc: Oral  PainSc:                  Lennix Rotundo DAVID

## 2017-10-25 NOTE — Op Note (Signed)
NAME:  [name].: [id number]      MEDICAL RECORD NO.: [id number]      FACILITY:  [facility]      PHYSICIAN:  Mauri Pole  DATE OF BIRTH:  August 03, 1955     DATE OF PROCEDURE:  10/25/2017                                 OPERATIVE REPORT         PREOPERATIVE DIAGNOSIS: Right  hip osteoarthritis.      POSTOPERATIVE DIAGNOSIS:  Right hip osteoarthritis.      PROCEDURE:  Right total hip replacement through an anterior approach   utilizing DePuy THR system, component size 67mm pinnacle cup, a size 32+4 neutral   Altrex liner, a size 3Hi Tri Lock stem with a 32+1 delta ceramic   ball.      SURGEON:  Pietro Cassis. Alvan Dame, M.D.      ASSISTANT:  Nehemiah Massed, PA-C     ANESTHESIA:  Spinal.      SPECIMENS:  None.      COMPLICATIONS:  None.      BLOOD LOSS:  275 cc     DRAINS:  None.      INDICATION OF THE PROCEDURE:  [name] is a 62 y.o. female who had   presented to office for evaluation of right hip pain.  Radiographs revealed   progressive degenerative changes with bone-on-bone   articulation to the  hip joint.  The patient had painful limited range of   motion significantly affecting their overall quality of life.  The patient was failing to    respond to conservative measures, and at this point was ready   to proceed with more definitive measures.  The patient has noted progressive   degenerative changes in his hip, progressive problems and dysfunction   with regarding the hip prior to surgery.  Consent was obtained for   benefit of pain relief.  Specific risk of infection, DVT, component   failure, dislocation, need for revision surgery, as well discussion of   the anterior versus posterior approach were reviewed.  Consent was   obtained for benefit of anterior pain relief through an anterior   approach.      PROCEDURE IN DETAIL:  The patient was brought to operative theater.   Once adequate anesthesia, preoperative  antibiotics, 1 gm of Vancomycin, 330mg  of Gentamycin, 10 mg of Decadron administered.   The patient was positioned supine on the OSI Hanna table.  Once adequate   padding of boney process was carried out, we had predraped out the hip, and  used fluoroscopy to confirm orientation of the pelvis and position.      The right hip was then prepped and draped from proximal iliac crest to   mid thigh with shower curtain technique.      Time-out was performed identifying the patient, planned procedure, and   extremity.     An incision was then made 2 cm distal and lateral to the   anterior superior iliac spine extending over the orientation of the   tensor fascia lata muscle and sharp dissection was carried down to the   fascia of the muscle and protractor placed in the soft tissues.      The fascia was then incised.  The muscle belly was identified and swept   laterally and retractor placed along the superior neck.  Following   cauterization of the circumflex vessels and removing some pericapsular   fat, a second cobra retractor was placed on the inferior neck.  A third   retractor was placed on the anterior acetabulum after elevating the   anterior rectus.  A L-capsulotomy was along the line of the   superior neck to the trochanteric fossa, then extended proximally and   distally.  Tag sutures were placed and the retractors were then placed   intracapsular.  We then identified the trochanteric fossa and   orientation of my neck cut, confirmed this radiographically   and then made a neck osteotomy with the femur on traction.  The femoral   head was removed without difficulty or complication.  Traction was let   off and retractors were placed posterior and anterior around the   acetabulum.      The labrum and foveal tissue were debrided.  I began reaming with a 56mm   reamer and reamed up to 19mm reamer with good bony bed preparation and a 46mm   cup was chosen.  The final 104mm Pinnacle cup  was then impacted under fluoroscopy  to confirm the depth of penetration and orientation with respect to   abduction.  A screw was placed followed by the hole eliminator.  The final   32+4 neutral Altrex liner was impacted with good visualized rim fit.  The cup was positioned anatomically within the acetabular portion of the pelvis.      At this point, the femur was rolled at 80 degrees.  Further capsule was   released off the inferior aspect of the femoral neck.  I then   released the superior capsule proximally.  The hook was placed laterally   along the femur and elevated manually and held in position with the bed   hook.  The leg was then extended and adducted with the leg rolled to 100   degrees of external rotation.  Once the proximal femur was fully   exposed, I used a box osteotome to set orientation.  I then began   broaching with the starting chili pepper broach and passed this by hand and then broached up to 3.  With the 3 broach in place I chose a high offset neck and did several trial reductions.  The offset was appropriate, leg lengths   appeared to be equal best matched with the +1 head ball confirmed radiographically.   Given these findings, I went ahead and dislocated the hip, repositioned all   retractors and positioned the right hip in the extended and abducted position.  The final 3 Hi Tri Lock stem was   chosen and it was impacted down to the level of neck cut.  Based on this   and the trial reduction, a 32+1 delta ceramic ball was chosen and   impacted onto a clean and dry trunnion, and the hip was reduced.  The   hip had been irrigated throughout the case again at this point.  I did   reapproximate the superior capsular leaflet to the anterior leaflet   using #1 Vicryl.  The fascia of the   tensor fascia lata muscle was then reapproximated using #1 Vicryl and #0 Stratafix sutures.  The   remaining wound was closed with 2-0 Vicryl and running 4-0 Monocryl.   The hip was  cleaned, dried, and dressed sterilely using  Dermabond and   Aquacel dressing.  She was then brought   to recovery room in stable condition tolerating the procedure well.    Nehemiah Massed, PA-C was present for the entirety of the case involved from   preoperative positioning, perioperative retractor management, general   facilitation of the case, as well as primary wound closure as assistant.            Pietro Cassis Alvan Dame, M.D.        10/25/2017 10:05 AM

## 2017-10-25 NOTE — Evaluation (Signed)
Physical Therapy Evaluation Patient Details Name: [name] MRN: [id number] DOB: 1956-06-18 Today's Date: 10/25/2017   History of Present Illness  62 yo female s/p R THA-direct anterior 10/25/17. Hx of back pain, R knee pain.   Clinical Impression  On eval POD 0, pt required Min assist for mobility. She was able to stand and take a few lateral steps along the side of the bed with a RW. Pt c/o feeling "groggy" and requested to return to bed to get some sleep- " I haven't slept in 3 days." Deferred ambulation on today. Will progress activity in next session. Pt prefers to have HHPT f/u (she has already discussed d/c plan with surgeon and plan is for HEP).    Follow Up Recommendations Follow surgeon's recommendation for DC plan and follow-up therapies    Equipment Recommendations  None recommended by PT    Recommendations for Other Services       Precautions / Restrictions Precautions Precautions: Fall Restrictions Weight Bearing Restrictions: No Other Position/Activity Restrictions: WBAT      Mobility  Bed Mobility Overal bed mobility: Needs Assistance Bed Mobility: Supine to Sit     Supine to sit: Min assist     General bed mobility comments: Assist for R LE.   Transfers Overall transfer level: Needs assistance Equipment used: Rolling walker (2 wheeled) Transfers: Sit to/from Stand Sit to Stand: Min assist         General transfer comment: Assist to rise, stabilize, VCs safety, hand placement.   Ambulation/Gait Ambulation/Gait assistance: Min assist   Assistive device: Rolling walker (2 wheeled)       General Gait Details: side steps along side of bed with a RW. Assist to steady and move walker. Deferred ambulation due to pt c/o feeling "groggy" and requesting to go back to bed to sleep.   Stairs            Wheelchair Mobility    Modified Rankin (Stroke Patients Only)       Balance Overall balance assessment: Needs assistance          Standing balance support: Bilateral upper extremity supported Standing balance-Leahy Scale: Poor                               Pertinent Vitals/Pain Pain Assessment: 0-10 Pain Score: 7  Pain Location: R hip with activity Pain Descriptors / Indicators: Aching;Sore;Discomfort Pain Intervention(s): Limited activity within patient's tolerance;Repositioned    Home Living Family/patient expects to be discharged to:: Private residence Living Arrangements: Spouse/significant other Available Help at Discharge: Family Type of Home: House Home Access: Stairs to enter Entrance Stairs-Rails: Right Entrance Stairs-Number of Steps: 4 Home Layout: One level;Able to live on main level with bedroom/bathroom Home Equipment: Gilford Rile - 2 wheels;Cane - single point;Other (comment);Tub bench(toilet riser)      Prior Function Level of Independence: Independent with assistive device(s)         Comments: using cane for ambulation     Hand Dominance        Extremity/Trunk Assessment   Upper Extremity Assessment Upper Extremity Assessment: Overall WFL for tasks assessed    Lower Extremity Assessment Lower Extremity Assessment: Generalized weakness(s/p R THA)    Cervical / Trunk Assessment Cervical / Trunk Assessment: Normal  Communication   Communication: No difficulties  Cognition Arousal/Alertness: Awake/alert Behavior During Therapy: WFL for tasks assessed/performed Overall Cognitive Status: Within Functional Limits for tasks assessed  General Comments      Exercises     Assessment/Plan    PT Assessment Patient needs continued PT services  PT Problem List Decreased strength;Decreased mobility;Decreased range of motion;Decreased activity tolerance;Decreased balance;Decreased knowledge of use of DME;Pain       PT Treatment Interventions DME instruction;Gait training;Functional mobility training;Therapeutic  activities;Balance training;Patient/family education;Therapeutic exercise;Stair training    PT Goals (Current goals can be found in the Care Plan section)  Acute Rehab PT Goals Patient Stated Goal: less pain PT Goal Formulation: With patient Time For Goal Achievement: 11/08/17 Potential to Achieve Goals: Good    Frequency 7X/week   Barriers to discharge        Co-evaluation               AM-PAC PT "6 Clicks" Daily Activity  Outcome Measure Difficulty turning over in bed (including adjusting bedclothes, sheets and blankets)?: A Lot Difficulty moving from lying on back to sitting on the side of the bed? : Unable Difficulty sitting down on and standing up from a chair with arms (e.g., wheelchair, bedside commode, etc,.)?: Unable Help needed moving to and from a bed to chair (including a wheelchair)?: A Little Help needed walking in hospital room?: A Little Help needed climbing 3-5 steps with a railing? : A Little 6 Click Score: 13    End of Session Equipment Utilized During Treatment: Gait belt Activity Tolerance: Patient tolerated treatment well Patient left: in bed;with call bell/phone within reach   PT Visit Diagnosis: Pain;Other abnormalities of gait and mobility (R26.89);Difficulty in walking, not elsewhere classified (R26.2) Pain - Right/Left: Right Pain - part of body: Hip    Time: 5374-8270 PT Time Calculation (min) (ACUTE ONLY): 17 min   Charges:   PT Evaluation $PT Eval Low Complexity: 1 Low     PT G Codes:          Weston Anna, MPT Pager: [phone]

## 2017-10-26 DIAGNOSIS — E663 Overweight: Secondary | ICD-10-CM | POA: Diagnosis present

## 2017-10-26 LAB — CBC
HCT: 34.5 % — ABNORMAL LOW (ref 36.0–46.0)
Hemoglobin: 11.1 g/dL — ABNORMAL LOW (ref 12.0–15.0)
MCH: 31 pg (ref 26.0–34.0)
MCHC: 32.2 g/dL (ref 30.0–36.0)
MCV: 96.4 fL (ref 78.0–100.0)
Platelets: 198 10*3/uL (ref 150–400)
RBC: 3.58 MIL/uL — ABNORMAL LOW (ref 3.87–5.11)
RDW: 12.6 % (ref 11.5–15.5)
WBC: 10.8 10*3/uL — ABNORMAL HIGH (ref 4.0–10.5)

## 2017-10-26 LAB — BASIC METABOLIC PANEL
Anion gap: 7 (ref 5–15)
BUN: 12 mg/dL (ref 6–20)
CO2: 25 mmol/L (ref 22–32)
Calcium: 8.3 mg/dL — ABNORMAL LOW (ref 8.9–10.3)
Chloride: 108 mmol/L (ref 101–111)
Creatinine, Ser: 0.8 mg/dL (ref 0.44–1.00)
GFR calc Af Amer: >60 mL/min (ref >60)
GFR calc non Af Amer: >60 mL/min (ref >60)
Glucose, Bld: 111 mg/dL — ABNORMAL HIGH (ref 65–99)
Potassium: 4.2 mmol/L (ref 3.5–5.1)
Sodium: 140 mmol/L (ref 135–145)

## 2017-10-26 NOTE — Progress Notes (Signed)
Discharge planning, no HH needs identified. Plan for HEP, has DME. [phone]

## 2017-10-26 NOTE — Progress Notes (Signed)
     Subjective: 1 Day Post-Op Procedure(s) (LRB): RIGHT TOTAL HIP ARTHROPLASTY ANTERIOR APPROACH (Right)   Patient reports pain as mild, pain controlled.  Didn't sleep well last night.  No events throughout the night.  Looking forward to progressing with her recovery.  Ready to be discharged home.   Objective:   VITALS:   Vitals:   10/26/17 0058 10/26/17 0510  BP: (!) 152/95 139/70  Pulse: 82 82  Resp: 17 14  Temp: 98.8 F (37.1 C) 98.6 F (37 C)  SpO2: 100% 100%    Dorsiflexion/Plantar flexion intact Incision: dressing C/D/I No cellulitis present Compartment soft  LABS Recent Labs    10/26/17 0603  HGB 11.1*  HCT 34.5*  WBC 10.8*  PLT 198    Recent Labs    10/26/17 0603  NA 140  K 4.2  BUN 12  CREATININE 0.80  GLUCOSE 111*     Assessment/Plan: 1 Day Post-Op Procedure(s) (LRB): RIGHT TOTAL HIP ARTHROPLASTY ANTERIOR APPROACH (Right) Foley cath d/c'ed Advance diet Up with therapy D/C IV fluids Discharge home Follow up in 2 weeks at [facility]. Follow up with OLIN,Ron Junco D in 2 weeks.  Contact information:  [facility] [address] [place] [phone]    Overweight (BMI 25-29.9)  Estimated body mass index is 28.02 kg/m as calculated from the following:   Height as of this encounter: 5' 5.5" (1.664 m).   Weight as of this encounter: 77.6 kg (171 lb). Patient also counseled that weight may inhibit the healing process Patient counseled that losing weight will help with future health issues       West Pugh. Sharlotte Baka   PAC  10/26/2017, 8:13 AM

## 2017-10-26 NOTE — Progress Notes (Signed)
Physical Therapy Treatment Patient Details Name: [name] MRN: [id number] DOB: May 30, 1956 Today's Date: 10/26/2017    History of Present Illness 62 yo female s/p R THA-direct anterior 10/25/17. Hx of back pain, R knee pain.     PT Comments    POD # 2 pm sessionspouse present for "hands on" instruction.  Assistedc OOB to amb in hallway, practiced 2 different stair scenerios, assisted to bathroom then performed most THR TE's following HEP handout.  Instructed on proper tech, freq as well as use of ICE. Pt required increased time and repeat functional VC's present with slight impaired cognition (meds?).  Pt has met goals to D/C to home with spouse who is also a Engineer, manufacturing.   Follow Up Recommendations  Follow surgeon's recommendation for DC plan and follow-up therapies     Equipment Recommendations  None recommended by PT    Recommendations for Other Services       Precautions / Restrictions Precautions Precautions: Fall Restrictions Weight Bearing Restrictions: No Other Position/Activity Restrictions: WBAT    Mobility  Bed Mobility Overal bed mobility: Needs Assistance Bed Mobility: Supine to Sit     Supine to sit: Min guard     General bed mobility comments: close guard for safety. Increased time.   Transfers Overall transfer level: Needs assistance Equipment used: Rolling walker (2 wheeled) Transfers: Sit to/from Stand Sit to Stand: Min guard         General transfer comment: close guard for safety. VCs safety, hand placement. Increased time.   Ambulation/Gait Ambulation/Gait assistance: Min guard Ambulation Distance (Feet): 45 Feet Assistive device: Rolling walker (2 wheeled) Gait Pattern/deviations: Step-to pattern;Decreased stance time - right Gait velocity: decreased    General Gait Details: close guard for safety. VCs safety, sequence, distance from RW, step length. Slow gait speed. 2 brief standing rest breaks.   Mild unsteady  gait   Stairs Stairs: Yes Stairs assistance: Min guard Stair Management: No rails;One rail Left;Step to pattern;Forwards Number of Stairs: 3 General stair comments: first practiced one step and then practiced 2 steps one rail with spouse "hands on"   Wheelchair Mobility    Modified Rankin (Stroke Patients Only)       Balance                                            Cognition Arousal/Alertness: Awake/alert Behavior During Therapy: WFL for tasks assessed/performed Overall Cognitive Status: Within Functional Limits for tasks assessed                                        Exercises Total Hip Replacement TE's 10 reps ankle pumps 10 reps knee presses 10 reps heel slides 10 reps SAQ's 10 reps AB10 reps  LAQ's 10 reps all standing TE's Followed by ICE   General Comments        Pertinent Vitals/Pain Pain Assessment: 0-10 Pain Score: 8  Pain Location: R hip with activity Pain Descriptors / Indicators: Aching;Sore;Discomfort;Operative site guarding Pain Intervention(s): Monitored during session;Repositioned;Ice applied    Home Living                      Prior Function            PT Goals (current goals can now  be found in the care plan section) Progress towards PT goals: Progressing toward goals    Frequency    7X/week      PT Plan Current plan remains appropriate    Co-evaluation              AM-PAC PT "6 Clicks" Daily Activity  Outcome Measure  Difficulty turning over in bed (including adjusting bedclothes, sheets and blankets)?: A Lot Difficulty moving from lying on back to sitting on the side of the bed? : Unable Difficulty sitting down on and standing up from a chair with arms (e.g., wheelchair, bedside commode, etc,.)?: A Little Help needed moving to and from a bed to chair (including a wheelchair)?: A Little Help needed walking in hospital room?: A Little Help needed climbing 3-5 steps with  a railing? : A Little 6 Click Score: 15    End of Session Equipment Utilized During Treatment: Gait belt Activity Tolerance: Patient tolerated treatment well Patient left: in chair;with call bell/phone within reach;with family/visitor present Nurse Communication: (pt ready for D/C to home) PT Visit Diagnosis: Pain;Other abnormalities of gait and mobility (R26.89);Difficulty in walking, not elsewhere classified (R26.2) Pain - Right/Left: Right Pain - part of body: Hip     Time: 1400-1440 PT Time Calculation (min) (ACUTE ONLY): 40 min  Charges:  $Gait Training: 8-22 mins $Therapeutic Exercise: 8-22 mins $Therapeutic Activity: 8-22 mins                    G Codes:       Rica Koyanagi  PTA [facility]  Acute  Rehab Pager      [phone]

## 2017-10-26 NOTE — Progress Notes (Signed)
Physical Therapy Treatment Patient Details Name: [name] MRN: [id number] DOB: 31-Oct-1955 Today's Date: 10/26/2017    History of Present Illness 62 yo female s/p R THA-direct anterior 10/25/17. Hx of back pain, R knee pain.     PT Comments    Progressing with mobility. Will plan to have a 2nd session prior to d/c later today.    Follow Up Recommendations  Follow surgeon's recommendation for DC plan and follow-up therapies     Equipment Recommendations  None recommended by PT    Recommendations for Other Services       Precautions / Restrictions Precautions Precautions: Fall Restrictions Weight Bearing Restrictions: No Other Position/Activity Restrictions: WBAT    Mobility  Bed Mobility Overal bed mobility: Needs Assistance Bed Mobility: Supine to Sit     Supine to sit: Min guard     General bed mobility comments: close guard for safety. Increased time.   Transfers Overall transfer level: Needs assistance Equipment used: Rolling walker (2 wheeled) Transfers: Sit to/from Stand Sit to Stand: Min guard         General transfer comment: close guard for safety. VCs safety, hand placement. Increased time.   Ambulation/Gait Ambulation/Gait assistance: Min guard Ambulation Distance (Feet): 60 Feet Assistive device: Rolling walker (2 wheeled) Gait Pattern/deviations: Step-to pattern     General Gait Details: close guard for safety. VCs safety, sequence, distance from RW, step length. Slow gait speed. 2 brief standing rest breaks.    Stairs             Wheelchair Mobility    Modified Rankin (Stroke Patients Only)       Balance                                            Cognition Arousal/Alertness: Awake/alert Behavior During Therapy: WFL for tasks assessed/performed Overall Cognitive Status: Within Functional Limits for tasks assessed                                        Exercises Total Joint  Exercises Ankle Circles/Pumps: AROM;Both;10 reps;Supine Quad Sets: AROM;Both;10 reps;Supine Heel Slides: AAROM;Right;10 reps;Supine Hip ABduction/ADduction: AAROM;Right;10 reps;Supine    General Comments        Pertinent Vitals/Pain Pain Assessment: 0-10 Pain Score: 7  Pain Location: R hip with activity Pain Descriptors / Indicators: Aching;Sore;Discomfort Pain Intervention(s): Limited activity within patient's tolerance;Ice applied;Repositioned    Home Living                      Prior Function            PT Goals (current goals can now be found in the care plan section) Progress towards PT goals: Progressing toward goals    Frequency    7X/week      PT Plan Current plan remains appropriate    Co-evaluation              AM-PAC PT "6 Clicks" Daily Activity  Outcome Measure  Difficulty turning over in bed (including adjusting bedclothes, sheets and blankets)?: A Lot Difficulty moving from lying on back to sitting on the side of the bed? : Unable Difficulty sitting down on and standing up from a chair with arms (e.g., wheelchair, bedside commode, etc,.)?: A Little Help needed  moving to and from a bed to chair (including a wheelchair)?: A Little Help needed walking in hospital room?: A Little Help needed climbing 3-5 steps with a railing? : A Little 6 Click Score: 15    End of Session Equipment Utilized During Treatment: Gait belt Activity Tolerance: Patient tolerated treatment well Patient left: in chair;with call bell/phone within reach;with family/visitor present   PT Visit Diagnosis: Pain;Other abnormalities of gait and mobility (R26.89);Difficulty in walking, not elsewhere classified (R26.2) Pain - Right/Left: Right Pain - part of body: Hip     Time: 9842-1031 PT Time Calculation (min) (ACUTE ONLY): 18 min  Charges:  $Gait Training: 8-22 mins                    G Codes:          Weston Anna, MPT Pager: [phone]

## 2017-10-26 NOTE — Progress Notes (Signed)
Provided discharge instructions to patient and spouse.  Discussed home care and safety, pain management, wound care, and constipation prevention.  Both verbalized understanding.  No questions/concerns at this time.  Patient discharging to home with all belongings.

## 2017-10-27 ENCOUNTER — Encounter: Payer: Self-pay | Admitting: Family Medicine

## 2017-10-31 ENCOUNTER — Encounter: Payer: Self-pay | Admitting: Family Medicine
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
## 2017-11-02 ENCOUNTER — Other Ambulatory Visit: Payer: Self-pay | Admitting: Family Medicine

## 2017-11-02 ENCOUNTER — Other Ambulatory Visit: Payer: Self-pay

## 2017-11-02 ENCOUNTER — Encounter (HOSPITAL_COMMUNITY): Payer: Self-pay | Admitting: Emergency Medicine

## 2017-11-02 ENCOUNTER — Emergency Department (HOSPITAL_COMMUNITY): Payer: Medicare HMO

## 2017-11-02 ENCOUNTER — Emergency Department (HOSPITAL_COMMUNITY)
Admission: EM | Admit: 2017-11-02 | Discharge: 2017-11-02 | Disposition: A | Discharge status: Home / Self Care | Payer: Medicare HMO | Attending: Emergency Medicine | Admitting: Emergency Medicine

## 2017-11-02 DIAGNOSIS — R0602 Shortness of breath: Secondary | ICD-10-CM | POA: Diagnosis not present

## 2017-11-02 DIAGNOSIS — M79661 Pain in right lower leg: Secondary | ICD-10-CM | POA: Insufficient documentation

## 2017-11-02 DIAGNOSIS — Z7982 Long term (current) use of aspirin: Secondary | ICD-10-CM | POA: Diagnosis not present

## 2017-11-02 DIAGNOSIS — I2510 Atherosclerotic heart disease of native coronary artery without angina pectoris: Secondary | ICD-10-CM | POA: Diagnosis not present

## 2017-11-02 DIAGNOSIS — Z853 Personal history of malignant neoplasm of breast: Secondary | ICD-10-CM | POA: Diagnosis not present

## 2017-11-02 DIAGNOSIS — R531 Weakness: Secondary | ICD-10-CM | POA: Diagnosis not present

## 2017-11-02 DIAGNOSIS — M79604 Pain in right leg: Secondary | ICD-10-CM | POA: Diagnosis not present

## 2017-11-02 DIAGNOSIS — Z96641 Presence of right artificial hip joint: Secondary | ICD-10-CM | POA: Insufficient documentation

## 2017-11-02 DIAGNOSIS — Z79899 Other long term (current) drug therapy: Secondary | ICD-10-CM | POA: Insufficient documentation

## 2017-11-02 DIAGNOSIS — M25551 Pain in right hip: Secondary | ICD-10-CM

## 2017-11-02 DIAGNOSIS — Z9889 Other specified postprocedural states: Secondary | ICD-10-CM | POA: Diagnosis not present

## 2017-11-02 DIAGNOSIS — R000 Tachycardia, unspecified: Secondary | ICD-10-CM | POA: Diagnosis not present

## 2017-11-02 LAB — BASIC METABOLIC PANEL
Anion gap: 10 (ref 5–15)
BUN: 19 mg/dL (ref 6–20)
CO2: 24 mmol/L (ref 22–32)
Calcium: 8.9 mg/dL (ref 8.9–10.3)
Chloride: 104 mmol/L (ref 101–111)
Creatinine, Ser: 0.8 mg/dL (ref 0.44–1.00)
GFR calc Af Amer: >60 mL/min (ref >60)
GFR calc non Af Amer: >60 mL/min (ref >60)
Glucose, Bld: 129 mg/dL — ABNORMAL HIGH (ref 65–99)
Potassium: 3.5 mmol/L (ref 3.5–5.1)
Sodium: 138 mmol/L (ref 135–145)

## 2017-11-02 LAB — I-STAT TROPONIN, ED: Troponin i, poc: 0 ng/mL (ref 0.00–0.08)

## 2017-11-02 LAB — CBC
HCT: 37.1 % (ref 36.0–46.0)
Hemoglobin: 12.3 g/dL (ref 12.0–15.0)
MCH: 31.7 pg (ref 26.0–34.0)
MCHC: 33.2 g/dL (ref 30.0–36.0)
MCV: 95.6 fL (ref 78.0–100.0)
Platelets: 362 10*3/uL (ref 150–400)
RBC: 3.88 MIL/uL (ref 3.87–5.11)
RDW: 13.3 % (ref 11.5–15.5)
WBC: 8.4 10*3/uL (ref 4.0–10.5)

## 2017-11-02 LAB — D-DIMER, QUANTITATIVE: D-Dimer, Quant: 6.3 ug/mL-FEU — ABNORMAL HIGH (ref 0.00–0.50)

## 2017-11-02 MED ORDER — IOPAMIDOL (ISOVUE-370) INJECTION 76%
100.0000 mL | Freq: Once | INTRAVENOUS | Status: AC | PRN
  Administered 2017-11-02: 100 mL via INTRAVENOUS

## 2017-11-02 MED ORDER — ENOXAPARIN SODIUM 80 MG/0.8ML ~~LOC~~ SOLN
1.0000 mg/kg | Freq: Once | SUBCUTANEOUS | Status: AC
  Administered 2017-11-02: 75 mg via SUBCUTANEOUS
  Filled 2017-11-02: qty 0.75

## 2017-11-02 MED ORDER — SODIUM CHLORIDE 0.9 % IV BOLUS
1000.0000 mL | Freq: Once | INTRAVENOUS | Status: AC
  Administered 2017-11-02: 1000 mL via INTRAVENOUS
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
## 2017-11-02 NOTE — ED Notes (Signed)
Spoke with Tegeler MD regarding pt status and complaint; verbal orders placed.

## 2017-11-02 NOTE — ED Triage Notes (Signed)
Pt complaint of SOB and right calf pain onset this morning; recent right hip surgery.

## 2017-11-02 NOTE — Discharge Instructions (Addendum)
Please see the information and instructions below regarding your visit.  Your diagnoses today include:  1. Right calf pain   2. Shortness of breath    IMPORTANT PATIENT INSTRUCTIONS:  You have been scheduled for an Outpatient Vascular Study at [facility].    If tomorrow is a Saturday, Sunday or holiday, please go to the [facility] Emergency Department Registration Desk at 8 am tomorrow morning and tell them you are there for a vascular study.  If tomorrow is a weekday (Monday-Friday), please go to [facility]mitting [facility] at 8 am and tell them  you are  there for a vascular study.  Tests performed today include: See side panel of your discharge paperwork for testing performed today. Vital signs are listed at the bottom of these instructions.   CT scan of your chest demonstrates no blood clot in your lungs.  You do have a small amount of atelectasis, which is collapsing of small air sacs in the lungs.  Your hemoglobin is normal today.  All the lab work is normal.  Medications prescribed:    Take any prescribed medications only as prescribed, and any over the counter medications only as directed on the packaging.  We gave you Lovenox, which is a one-time dose to cover you for any blood clots until you have your ultrasound tomorrow.  Home care instructions:  Please follow any educational materials contained in this packet.   Follow-up instructions: Please follow-up with your primary care provider for further evaluation of your home health physical therapy.   Return instructions:  Please return to the Emergency Department if you experience worsening symptoms.  Please return the emergency department for any fevers, chills, increasing cough productive of sputum, chest pain, increasing shortness of breath, or feeling that you are going to pass out. Please return if you have any other emergent concerns.  Additional Information:   Your vital signs today were: BP 138/70  (BP Location: Left Arm)    Pulse 88    Temp 97.8 F (36.6 C) (Oral)    Resp 20    Ht 5\' 5"  (1.651 m)    Wt 74.8 kg (165 lb)    SpO2 100%    BMI 27.46 kg/m  If your blood pressure (BP) was elevated on multiple readings during this visit above 130 for the top number or above 80 for the bottom number, please have this repeated by your primary care provider within one month. --------------  Thank you for allowing [place] to participate in your care today.

## 2017-11-03 ENCOUNTER — Ambulatory Visit (HOSPITAL_COMMUNITY)
Admission: RE | Admit: 2017-11-03 | Discharge: 2017-11-03 | Disposition: A | Discharge status: Home / Self Care | Payer: Medicare HMO | Source: Ambulatory Visit | Attending: Physician Assistant | Admitting: Physician Assistant

## 2017-11-03 DIAGNOSIS — M79609 Pain in unspecified limb: Secondary | ICD-10-CM

## 2017-11-03 DIAGNOSIS — M79661 Pain in right lower leg: Secondary | ICD-10-CM | POA: Diagnosis not present

## 2017-11-03 NOTE — Progress Notes (Signed)
*  Preliminary Results* Right lower extremity venous duplex completed. Right lower extremity is negative for deep vein thrombosis. There is no evidence of right Baker's cyst.  11/03/2017 9:21 AM  Maudry Mayhew, BS, RVT, RDCS, RDMS

## 2017-11-04 ENCOUNTER — Encounter: Payer: Self-pay | Admitting: Adult Health

## 2017-11-04 ENCOUNTER — Encounter: Payer: Self-pay | Admitting: Oncology

## 2017-11-04 NOTE — Telephone Encounter (Signed)
Per TP: neg PE, CT negative, RUL nodular density that be from residual bronchitis/asthma flares.  We will follow up on this with another CT w/o contrast in 3 months with office visit.  There was no evidence of any cough/congestion but will treat if she's having symptoms.  Thanks.  E-mail sent to patient Will sign off

## 2017-11-05 DIAGNOSIS — T451X5S Adverse effect of antineoplastic and immunosuppressive drugs, sequela: Secondary | ICD-10-CM | POA: Diagnosis not present

## 2017-11-05 DIAGNOSIS — G629 Polyneuropathy, unspecified: Secondary | ICD-10-CM | POA: Diagnosis not present

## 2017-11-05 DIAGNOSIS — Z96641 Presence of right artificial hip joint: Secondary | ICD-10-CM | POA: Diagnosis not present

## 2017-11-05 DIAGNOSIS — I428 Other cardiomyopathies: Secondary | ICD-10-CM | POA: Diagnosis not present

## 2017-11-05 DIAGNOSIS — M79661 Pain in right lower leg: Secondary | ICD-10-CM | POA: Diagnosis not present

## 2017-11-05 DIAGNOSIS — Z471 Aftercare following joint replacement surgery: Secondary | ICD-10-CM | POA: Diagnosis not present

## 2017-11-05 DIAGNOSIS — I427 Cardiomyopathy due to drug and external agent: Secondary | ICD-10-CM | POA: Diagnosis not present

## 2017-11-05 DIAGNOSIS — Q268 Other congenital malformations of great veins: Secondary | ICD-10-CM | POA: Diagnosis not present

## 2017-11-05 DIAGNOSIS — J45909 Unspecified asthma, uncomplicated: Secondary | ICD-10-CM | POA: Diagnosis not present

## 2017-11-05 DIAGNOSIS — I2510 Atherosclerotic heart disease of native coronary artery without angina pectoris: Secondary | ICD-10-CM | POA: Diagnosis not present

## 2017-11-08 ENCOUNTER — Other Ambulatory Visit: Payer: Self-pay | Admitting: Oncology

## 2017-11-08 ENCOUNTER — Telehealth: Payer: Self-pay | Admitting: *Deleted

## 2017-11-08 ENCOUNTER — Encounter: Payer: Self-pay | Admitting: Oncology

## 2017-11-08 DIAGNOSIS — I428 Other cardiomyopathies: Secondary | ICD-10-CM | POA: Diagnosis not present

## 2017-11-08 DIAGNOSIS — G629 Polyneuropathy, unspecified: Secondary | ICD-10-CM | POA: Diagnosis not present

## 2017-11-08 DIAGNOSIS — Z471 Aftercare following joint replacement surgery: Secondary | ICD-10-CM | POA: Diagnosis not present

## 2017-11-08 DIAGNOSIS — Q268 Other congenital malformations of great veins: Secondary | ICD-10-CM | POA: Diagnosis not present

## 2017-11-08 DIAGNOSIS — J45909 Unspecified asthma, uncomplicated: Secondary | ICD-10-CM | POA: Diagnosis not present

## 2017-11-08 DIAGNOSIS — Z96641 Presence of right artificial hip joint: Secondary | ICD-10-CM | POA: Diagnosis not present

## 2017-11-08 DIAGNOSIS — T451X5S Adverse effect of antineoplastic and immunosuppressive drugs, sequela: Secondary | ICD-10-CM | POA: Diagnosis not present

## 2017-11-08 DIAGNOSIS — I427 Cardiomyopathy due to drug and external agent: Secondary | ICD-10-CM | POA: Diagnosis not present

## 2017-11-08 DIAGNOSIS — M79661 Pain in right lower leg: Secondary | ICD-10-CM | POA: Diagnosis not present

## 2017-11-08 DIAGNOSIS — I2510 Atherosclerotic heart disease of native coronary artery without angina pectoris: Secondary | ICD-10-CM | POA: Diagnosis not present

## 2017-11-08 NOTE — Telephone Encounter (Signed)
Received Physician Orders from [facility]; forwarded to provider/SLS 05/14  

## 2017-11-09 DIAGNOSIS — Z96641 Presence of right artificial hip joint: Secondary | ICD-10-CM | POA: Diagnosis not present

## 2017-11-09 DIAGNOSIS — Z471 Aftercare following joint replacement surgery: Secondary | ICD-10-CM | POA: Diagnosis not present

## 2017-11-09 DIAGNOSIS — M25561 Pain in right knee: Secondary | ICD-10-CM | POA: Diagnosis not present

## 2017-11-10 ENCOUNTER — Other Ambulatory Visit: Payer: Self-pay | Admitting: Oncology

## 2017-11-10 NOTE — Progress Notes (Unsigned)
[name] called.  She had a concern because the scan she recently had for evaluation for PE said that she is status post thyroidectomy.  Of course she is not status post thyroidectomy but she has been on thyroid replacement for many years.  I think what they are seeing in that area is atrophic thyroid tissue.  She was very reassured by this.  We are seeing her next October and survivorship.

## 2017-11-11 DIAGNOSIS — I2510 Atherosclerotic heart disease of native coronary artery without angina pectoris: Secondary | ICD-10-CM | POA: Diagnosis not present

## 2017-11-11 DIAGNOSIS — I428 Other cardiomyopathies: Secondary | ICD-10-CM | POA: Diagnosis not present

## 2017-11-11 DIAGNOSIS — G629 Polyneuropathy, unspecified: Secondary | ICD-10-CM | POA: Diagnosis not present

## 2017-11-11 DIAGNOSIS — M79661 Pain in right lower leg: Secondary | ICD-10-CM | POA: Diagnosis not present

## 2017-11-11 DIAGNOSIS — I427 Cardiomyopathy due to drug and external agent: Secondary | ICD-10-CM | POA: Diagnosis not present

## 2017-11-11 DIAGNOSIS — Z96641 Presence of right artificial hip joint: Secondary | ICD-10-CM | POA: Diagnosis not present

## 2017-11-11 DIAGNOSIS — J45909 Unspecified asthma, uncomplicated: Secondary | ICD-10-CM | POA: Diagnosis not present

## 2017-11-11 DIAGNOSIS — T451X5S Adverse effect of antineoplastic and immunosuppressive drugs, sequela: Secondary | ICD-10-CM | POA: Diagnosis not present

## 2017-11-11 DIAGNOSIS — Z471 Aftercare following joint replacement surgery: Secondary | ICD-10-CM | POA: Diagnosis not present

## 2017-11-11 DIAGNOSIS — Q268 Other congenital malformations of great veins: Secondary | ICD-10-CM | POA: Diagnosis not present

## 2017-11-11 NOTE — Telephone Encounter (Signed)
Received call from Herbert Deaner PT with [facility] stating that this faxed order also requested order for OT eval. He is asking if he can get a VO for this OT eval while waiting for signed orders. Please call [phone].

## 2017-11-14 NOTE — Telephone Encounter (Signed)
Please advise 

## 2017-11-14 NOTE — Telephone Encounter (Signed)
Yes they can have a VO for OT

## 2017-11-15 ENCOUNTER — Encounter: Payer: Self-pay | Admitting: Family Medicine

## 2017-11-15 ENCOUNTER — Ambulatory Visit (INDEPENDENT_AMBULATORY_CARE_PROVIDER_SITE_OTHER): Payer: Medicare HMO | Admitting: Family Medicine

## 2017-11-15 VITALS — BP 110/74 | HR 94 | Resp 18 | Ht 65.0 in | Wt 170.6 lb

## 2017-11-15 DIAGNOSIS — E785 Hyperlipidemia, unspecified: Secondary | ICD-10-CM | POA: Diagnosis not present

## 2017-11-15 DIAGNOSIS — N179 Acute kidney failure, unspecified: Secondary | ICD-10-CM | POA: Diagnosis not present

## 2017-11-15 DIAGNOSIS — E039 Hypothyroidism, unspecified: Secondary | ICD-10-CM

## 2017-11-15 DIAGNOSIS — R609 Edema, unspecified: Secondary | ICD-10-CM | POA: Diagnosis not present

## 2017-11-15 DIAGNOSIS — K219 Gastro-esophageal reflux disease without esophagitis: Secondary | ICD-10-CM

## 2017-11-15 DIAGNOSIS — R946 Abnormal results of thyroid function studies: Secondary | ICD-10-CM

## 2017-11-15 DIAGNOSIS — D649 Anemia, unspecified: Secondary | ICD-10-CM | POA: Diagnosis not present

## 2017-11-15 DIAGNOSIS — R000 Tachycardia, unspecified: Secondary | ICD-10-CM | POA: Diagnosis not present

## 2017-11-15 DIAGNOSIS — Z96649 Presence of unspecified artificial hip joint: Secondary | ICD-10-CM | POA: Diagnosis not present

## 2017-11-15 LAB — CBC WITH DIFFERENTIAL/PLATELET
Basophils Absolute: 0 10*3/uL (ref 0.0–0.1)
Basophils Relative: 0.5 % (ref 0.0–3.0)
Eosinophils Absolute: 0.2 10*3/uL (ref 0.0–0.7)
Eosinophils Relative: 2.7 % (ref 0.0–5.0)
HCT: 41.2 % (ref 36.0–46.0)
Hemoglobin: 13.4 g/dL (ref 12.0–15.0)
Lymphocytes Relative: 22.4 % (ref 12.0–46.0)
Lymphs Abs: 1.7 10*3/uL (ref 0.7–4.0)
MCHC: 32.4 g/dL (ref 30.0–36.0)
MCV: 97.1 fl (ref 78.0–100.0)
Monocytes Absolute: 0.7 10*3/uL (ref 0.1–1.0)
Monocytes Relative: 8.9 % (ref 3.0–12.0)
Neutro Abs: 5 10*3/uL (ref 1.4–7.7)
Neutrophils Relative %: 65.5 % (ref 43.0–77.0)
Platelets: 422 10*3/uL — ABNORMAL HIGH (ref 150.0–400.0)
RBC: 4.25 Mil/uL (ref 3.87–5.11)
RDW: 14.3 % (ref 11.5–15.5)
WBC: 7.7 10*3/uL (ref 4.0–10.5)

## 2017-11-15 MED ORDER — VALACYCLOVIR HCL 1 G PO TABS: 1000.0000 mg | ORAL_TABLET | Freq: Three times a day (TID) | ORAL | 0 refills | Status: DC

## 2017-11-15 NOTE — Patient Instructions (Addendum)
Encouraged increased hydration and fiber in diet. Daily probiotics. If bowels not moving can use MOM 2 tbls po in 4 oz of warm prune juice by mouth every 2-3 days. If no results then repeat in 4 hours with  Dulcolax suppository pr, may repeat again in 4 more hours as needed. Seek care if symptoms worsen. Consider daily Miralax and/or Dulcolax if symptoms persist.   Miralax and Benefiber together once or twice a day Cold Sore A cold sore, also called a fever blister, is a skin infection that causes small, fluid-filled sores to form inside of the mouth or on the lips, gums, nose, chin, or cheeks. Cold sores can spread to other parts of the body, such as the eyes or fingers. In some people with other medical conditions, cold sores can spread to multiple other body sites, including the genitals. Cold sores can be spread or passed from person to person (contagious) until the sores crust over completely. What are the causes? Cold sores are caused by the herpes simplex virus (HSV-1). HSV-1 is closely related to the virus that causes genital herpes (HSV-2), but these viruses are not the same. Once a person is infected with HSV-1, the virus remains permanently in the body. HSV-1 is spread from person to person through close contact, such as through kissing, touching the affected area, or sharing personal items such as lip balm, razors, or eating utensils. What increases the risk? A cold sore outbreak is more likely to develop in people who:  Are tired, stressed, or sick.  Are menstruating.  Are pregnant.  Take certain medicines.  Are exposed to cold weather or too much sun.  What are the signs or symptoms? Symptoms of a cold sore outbreak often go through different stages. Here is how a cold sore develops:  Tingling, itching, or burning is felt 1-2 days before the outbreak.  Fluid-filled blisters appear on the lips, inside the mouth, on the nose, or on the cheeks.  The blisters start to ooze clear  fluid.  The blisters dry up and a yellow crust appears in its place.  The crust falls off.  Other symptoms include:  Fever.  Sore throat.  Headache.  Muscle aches.  Swollen neck glands.  You also may not have any symptoms. How is this diagnosed? This condition is often diagnosed based on your medical history and a physical exam. Your health care provider may swab your sore and then examine it in the lab. Rarely, blood tests may be done to check for HSV-1. How is this treated? There is no cure for cold sores or HSV-1. There also is no vaccine for HSV-1. Most cold sores go away on their own without treatment within two weeks. Medicines cannot make the infection go away, but medicines can:  Help relieve some of the pain associated with the sores.  Work to stop the virus from multiplying.  Shorten healing time.  Medicines may be in the form of creams, gels, pills, or a shot. Follow these instructions at home: Medicines  Take or apply over-the-counter and prescription medicines only as told by your health care provider.  Use a cotton-tip swab to apply creams or gels to your sores. Sore Care  Do not touch the sores or pick the scabs.  Wash your hands often. Do not touch your eyes without washing your hands first.  Keep the sores clean and dry.  If directed, apply ice to the sores: ? Put ice in a plastic bag. ? Place a  towel between your skin and the bag. ? Leave the ice on for 20 minutes, 2-3 times per day. Lifestyle  Do not kiss, have oral sex, or share personal items until your sores heal.  Eat a soft, bland diet. Avoid eating hot, cold, or salty foods. These can hurt your mouth.  Use a straw if it hurts to drink out of a glass.  Avoid the sun and limit your stress if these things trigger outbreaks. If sun causes cold sores, apply sunscreen on your lips before being out in the sun. Contact a health care provider if:  You have symptoms for more than two  weeks.  You have pus coming from the sores.  You have redness that is spreading.  You have pain or irritation in your eye.  You get sores on your genitals.  Your sores do not heal within two weeks.  You have frequent cold sore outbreaks. Get help right away if:  You have a fever and your symptoms suddenly get worse.  You have a headache and confusion. This information is not intended to replace advice given to you by your health care provider. Make sure you discuss any questions you have with your health care provider. Document Released: 06/11/2000 Document Revised: 02/06/2016 Document Reviewed: 04/04/2015 Elsevier Interactive Patient Education  [employer].

## 2017-11-15 NOTE — Assessment & Plan Note (Addendum)
Use compression hose, elevate feet above heart for at least 15 minutes 3 x a day, minimize sodium, recent ultrasound of right leg negative for DVT.

## 2017-11-16 NOTE — Telephone Encounter (Signed)
Called Jim back no answer left voicemail

## 2017-11-17 ENCOUNTER — Ambulatory Visit
Admission: RE | Admit: 2017-11-17 | Discharge: 2017-11-17 | Disposition: A | Discharge status: Home / Self Care | Payer: Medicare HMO | Source: Ambulatory Visit | Attending: Family Medicine | Admitting: Family Medicine

## 2017-11-17 DIAGNOSIS — E039 Hypothyroidism, unspecified: Secondary | ICD-10-CM

## 2017-11-17 DIAGNOSIS — R946 Abnormal results of thyroid function studies: Secondary | ICD-10-CM

## 2017-11-17 DIAGNOSIS — E041 Nontoxic single thyroid nodule: Secondary | ICD-10-CM | POA: Diagnosis not present

## 2017-11-18 ENCOUNTER — Telehealth: Payer: Self-pay

## 2017-11-18 NOTE — Telephone Encounter (Signed)
Called pt to let her know that Dr.Magrinat reviewed her ultrasound and results came back no suspicious finding and is fine. Pt appreciative of call. No further concerns at this time.

## 2017-11-21 NOTE — Assessment & Plan Note (Signed)
Avoid offending foods, start probiotics. Do not eat large meals in late evening and consider raising head of bed.  

## 2017-11-21 NOTE — Assessment & Plan Note (Signed)
On Levothyroxine, continue to monitor 
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
## 2017-11-21 NOTE — Assessment & Plan Note (Signed)
Improved on recent check 

## 2017-11-21 NOTE — Assessment & Plan Note (Signed)
Improved on recheck, hydrate well and minimize caffeine. Will continue to monitor

## 2017-11-21 NOTE — Assessment & Plan Note (Signed)
Continues to struggl with daily pain and is managing with Oxycodone and muscle relaxants minimally. Is tolerating PT at home.

## 2017-11-21 NOTE — Assessment & Plan Note (Signed)
Encouraged heart healthy diet, increase exercise, avoid trans fats, consider a krill oil cap daily 

## 2017-11-23 ENCOUNTER — Telehealth: Payer: Self-pay | Admitting: *Deleted

## 2017-11-23 NOTE — Telephone Encounter (Signed)
Received Home Health Certification and Plan of Care; forwarded to provider/SLS 05/29

## 2017-11-24 ENCOUNTER — Telehealth: Payer: Self-pay | Admitting: *Deleted

## 2017-11-24 NOTE — Telephone Encounter (Signed)
Received Physician Orders from [facility]; forwarded to provider/SLS 05/30  

## 2017-11-25 ENCOUNTER — Other Ambulatory Visit: Payer: Self-pay

## 2017-11-25 MED ORDER — VITAMIN D (ERGOCALCIFEROL) 1.25 MG (50000 UNIT) PO CAPS: 50000.0000 [IU] | ORAL_CAPSULE | ORAL | 4 refills | Status: DC

## 2017-12-06 DIAGNOSIS — M25551 Pain in right hip: Secondary | ICD-10-CM | POA: Diagnosis not present

## 2017-12-09 DIAGNOSIS — Z96641 Presence of right artificial hip joint: Secondary | ICD-10-CM | POA: Diagnosis not present

## 2017-12-09 DIAGNOSIS — M25551 Pain in right hip: Secondary | ICD-10-CM | POA: Diagnosis not present

## 2017-12-09 DIAGNOSIS — M25561 Pain in right knee: Secondary | ICD-10-CM | POA: Diagnosis not present

## 2017-12-09 DIAGNOSIS — Z471 Aftercare following joint replacement surgery: Secondary | ICD-10-CM | POA: Diagnosis not present

## 2017-12-12 DIAGNOSIS — M25551 Pain in right hip: Secondary | ICD-10-CM | POA: Diagnosis not present

## 2017-12-13 ENCOUNTER — Ambulatory Visit: Payer: Self-pay | Admitting: Family Medicine

## 2017-12-16 DIAGNOSIS — M25551 Pain in right hip: Secondary | ICD-10-CM | POA: Diagnosis not present

## 2017-12-19 DIAGNOSIS — M25551 Pain in right hip: Secondary | ICD-10-CM | POA: Diagnosis not present

## 2017-12-23 DIAGNOSIS — M25561 Pain in right knee: Secondary | ICD-10-CM | POA: Diagnosis not present

## 2017-12-23 DIAGNOSIS — M25551 Pain in right hip: Secondary | ICD-10-CM | POA: Diagnosis not present

## 2017-12-23 DIAGNOSIS — M545 Low back pain: Secondary | ICD-10-CM | POA: Diagnosis not present

## 2017-12-27 DIAGNOSIS — Z471 Aftercare following joint replacement surgery: Secondary | ICD-10-CM | POA: Diagnosis not present

## 2017-12-27 DIAGNOSIS — Z96641 Presence of right artificial hip joint: Secondary | ICD-10-CM | POA: Diagnosis not present

## 2017-12-27 DIAGNOSIS — M545 Low back pain: Secondary | ICD-10-CM | POA: Diagnosis not present

## 2017-12-30 DIAGNOSIS — M25551 Pain in right hip: Secondary | ICD-10-CM | POA: Diagnosis not present

## 2018-01-02 DIAGNOSIS — M25551 Pain in right hip: Secondary | ICD-10-CM | POA: Diagnosis not present

## 2018-01-05 ENCOUNTER — Telehealth: Payer: Self-pay

## 2018-01-05 DIAGNOSIS — M25551 Pain in right hip: Secondary | ICD-10-CM | POA: Diagnosis not present

## 2018-01-05 NOTE — Telephone Encounter (Signed)
   [facility] Pre-operative Risk Assessment    Request for surgical clearance:  1. What type of surgery is being performed? Right TKA   2. When is this surgery scheduled? TBD  3. What type of clearance is required (medical clearance vs. Pharmacy clearance to hold med vs. Both)? Medical Clearance  4. Are there any medications that need to be held prior to surgery and how long? None  5. Practice name and name of physician performing surgery? [facility] - Dr.Collins  6. What is your office phone number [phone] ([place])   7.   What is your office fax number [phone]  8.   Anesthesia type (None, local, MAC, general) ? Spinal and Adductor Canal Block    Ena Dawley 01/05/2018, 8:23 AM  _________________________________________________________________   (provider comments below)

## 2018-01-06 ENCOUNTER — Telehealth: Payer: Self-pay

## 2018-01-06 NOTE — Telephone Encounter (Signed)
   Primary Cardiologist: Minus Breeding, MD  Chart reviewed as part of pre-operative protocol coverage. Patient was contacted 01/06/2018 in reference to pre-operative risk assessment for pending surgery as outlined below.  [name] was last seen on 09/28/17 by Almyra Deforest, PA for clearance prior to hip surgery.  Since that day, [name] has done well.  She did well with her hip surgery and [facility]. She is having no exertional chest discomfort or dyspnea. She takes no cardiac medications.   Therefore, based on ACC/AHA guidelines, the patient would be at acceptable risk for the planned procedure without further cardiovascular testing.   I will route this recommendation to the requesting party via Epic fax function and remove from pre-op pool.  Please call with questions.  Daune Perch, NP 01/06/2018, 1:20 PM

## 2018-01-06 NOTE — Telephone Encounter (Signed)
Pt already has appt with Dr. Charlett Blake on 01/17/18. She will bring in surgical clearance form for knee on that day.

## 2018-01-06 NOTE — Telephone Encounter (Signed)
VM left for pt to return call to assess status.

## 2018-01-06 NOTE — Telephone Encounter (Signed)
Copied from [place] [phone]. Topic: Appointment Scheduling - Scheduling Inquiry for Clinic >> Jan 04, 2018 10:08 AM Rutherford Nail, NT wrote: Reason for CRM: Patient calling to schedule a surgical clearance visit. States that she needs a knee replacement. Offered her Dr Frederik Pear first available 30 mins OV appointment (Aug 15-3:45pm), patient states that she is in too much pain to wait that long for an appointment. Would like to know if she could be fit in with Dr Charlett Blake? Please advise.  CB#: [phone] or [phone]     -[place], Please inform patient Dr. Charlett Blake is fully booked and she can see another provider if not she will have to get her next available or keep the appt she has.   Thanks, PC

## 2018-01-09 DIAGNOSIS — M25551 Pain in right hip: Secondary | ICD-10-CM | POA: Diagnosis not present

## 2018-01-10 DIAGNOSIS — M5136 Other intervertebral disc degeneration, lumbar region: Secondary | ICD-10-CM | POA: Diagnosis not present

## 2018-01-12 ENCOUNTER — Ambulatory Visit: Payer: Medicare HMO | Admitting: Adult Health

## 2018-01-12 ENCOUNTER — Encounter: Payer: Self-pay | Admitting: Adult Health

## 2018-01-12 DIAGNOSIS — Z01818 Encounter for other preprocedural examination: Secondary | ICD-10-CM

## 2018-01-12 DIAGNOSIS — R918 Other nonspecific abnormal finding of lung field: Secondary | ICD-10-CM

## 2018-01-12 DIAGNOSIS — M25551 Pain in right hip: Secondary | ICD-10-CM | POA: Diagnosis not present

## 2018-01-12 DIAGNOSIS — R9389 Abnormal findings on diagnostic imaging of other specified body structures: Secondary | ICD-10-CM | POA: Diagnosis not present

## 2018-01-12 DIAGNOSIS — J4530 Mild persistent asthma, uncomplicated: Secondary | ICD-10-CM | POA: Diagnosis not present
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
## 2018-01-12 NOTE — Assessment & Plan Note (Signed)
Well controlled on current regimen   Plan  Patient Instructions  Good luck with upcoming surgery .  CT chest in 1 month .  Use incentive spirometry after surgery often , early mobility as able.  Continue Synmbicort 1 puffs Twice daily, rinse after use .   Follow up with Dr. Lamonte Sakai  In 3-4 months and As needed   Please contact office for sooner follow up if symptoms do not improve or worsen or seek emergency care

## 2018-01-12 NOTE — Assessment & Plan Note (Signed)
Plan for CT chest next month to monitor RUL density .

## 2018-01-12 NOTE — Patient Instructions (Addendum)
Good luck with upcoming surgery .  CT chest in 1 month .  Use incentive spirometry after surgery often , early mobility as able.  Continue Synmbicort 1 puffs Twice daily, rinse after use .   Follow up with Dr. Lamonte Sakai  In 3-4 months and As needed   Please contact office for sooner follow up if symptoms do not improve or worsen or seek emergency care

## 2018-01-12 NOTE — Assessment & Plan Note (Signed)
preop pulmonary clearance -  Pt is doing well with asthma with no recent flare  Appears stable from a pulmonary stand point to proceed with knee surgery  Advised on potential pulmonary complications    Major Pulmonary risks identified in the multifactorial risk analysis are but not limited to a) pneumonia; b) recurrent intubation risk; c) prolonged or recurrent acute respiratory failure needing mechanical ventilation; d) prolonged hospitalization; e) DVT/Pulmonary embolism; f) Acute Pulmonary edema  Recommend 1. Short duration of surgery as much as possible and avoid paralytic if possible  2. Recovery in step down or ICU with Pulmonary consultation - if indicated.  3. DVT prophylaxis 4. Aggressive pulmonary toilet with o2, bronchodilatation, and incentive spirometry and early ambulation

## 2018-01-17 ENCOUNTER — Encounter: Payer: Self-pay | Admitting: Family Medicine

## 2018-01-17 ENCOUNTER — Ambulatory Visit (INDEPENDENT_AMBULATORY_CARE_PROVIDER_SITE_OTHER): Payer: Medicare HMO | Admitting: Family Medicine

## 2018-01-17 VITALS — BP 92/62 | HR 121 | Temp 97.7°F | Resp 18 | Ht 65.0 in | Wt 156.8 lb

## 2018-01-17 DIAGNOSIS — N179 Acute kidney failure, unspecified: Secondary | ICD-10-CM

## 2018-01-17 DIAGNOSIS — R739 Hyperglycemia, unspecified: Secondary | ICD-10-CM | POA: Diagnosis not present

## 2018-01-17 DIAGNOSIS — B09 Unspecified viral infection characterized by skin and mucous membrane lesions: Secondary | ICD-10-CM

## 2018-01-17 DIAGNOSIS — D582 Other hemoglobinopathies: Secondary | ICD-10-CM

## 2018-01-17 DIAGNOSIS — E785 Hyperlipidemia, unspecified: Secondary | ICD-10-CM | POA: Diagnosis not present

## 2018-01-17 DIAGNOSIS — R748 Abnormal levels of other serum enzymes: Secondary | ICD-10-CM | POA: Diagnosis not present

## 2018-01-17 DIAGNOSIS — M25561 Pain in right knee: Secondary | ICD-10-CM | POA: Diagnosis not present

## 2018-01-17 DIAGNOSIS — E039 Hypothyroidism, unspecified: Secondary | ICD-10-CM | POA: Diagnosis not present

## 2018-01-17 DIAGNOSIS — E559 Vitamin D deficiency, unspecified: Secondary | ICD-10-CM

## 2018-01-17 DIAGNOSIS — M25562 Pain in left knee: Secondary | ICD-10-CM

## 2018-01-17 DIAGNOSIS — M25551 Pain in right hip: Secondary | ICD-10-CM | POA: Diagnosis not present

## 2018-01-17 LAB — LIPID PANEL
Cholesterol: 258 mg/dL — ABNORMAL HIGH (ref 0–200)
HDL: 57.8 mg/dL (ref >39.00)
LDL Cholesterol: 176 mg/dL — ABNORMAL HIGH (ref 0–99)
NonHDL: 200.17
Total CHOL/HDL Ratio: 4
Triglycerides: 123 mg/dL (ref 0.0–149.0)
VLDL: 24.6 mg/dL (ref 0.0–40.0)

## 2018-01-17 LAB — COMPREHENSIVE METABOLIC PANEL
ALT: 16 U/L (ref 0–35)
AST: 17 U/L (ref 0–37)
Albumin: 4.4 g/dL (ref 3.5–5.2)
Alkaline Phosphatase: 132 U/L — ABNORMAL HIGH (ref 39–117)
BUN: 18 mg/dL (ref 6–23)
CO2: 27 mEq/L (ref 19–32)
Calcium: 9.6 mg/dL (ref 8.4–10.5)
Chloride: 98 mEq/L (ref 96–112)
Creatinine, Ser: 1.1 mg/dL (ref 0.40–1.20)
GFR: 53.7 mL/min — ABNORMAL LOW (ref >60.00)
Glucose, Bld: 98 mg/dL (ref 70–99)
Potassium: 4 mEq/L (ref 3.5–5.1)
Sodium: 137 mEq/L (ref 135–145)
Total Bilirubin: 0.7 mg/dL (ref 0.2–1.2)
Total Protein: 7.3 g/dL (ref 6.0–8.3)

## 2018-01-17 LAB — CBC
HCT: 47.7 % — ABNORMAL HIGH (ref 36.0–46.0)
Hemoglobin: 15.8 g/dL — ABNORMAL HIGH (ref 12.0–15.0)
MCHC: 33.1 g/dL (ref 30.0–36.0)
MCV: 96 fl (ref 78.0–100.0)
Platelets: 322 10*3/uL (ref 150.0–400.0)
RBC: 4.98 Mil/uL (ref 3.87–5.11)
RDW: 14.3 % (ref 11.5–15.5)
WBC: 8.3 10*3/uL (ref 4.0–10.5)

## 2018-01-17 LAB — T4, FREE: Free T4: 1.47 ng/dL (ref 0.60–1.60)

## 2018-01-17 LAB — VITAMIN D 25 HYDROXY (VIT D DEFICIENCY, FRACTURES): VITD: 45.74 ng/mL (ref 30.00–100.00)

## 2018-01-17 LAB — TSH: TSH: 2.14 u[IU]/mL (ref 0.35–4.50)

## 2018-01-17 LAB — HEMOGLOBIN A1C: Hgb A1c MFr Bld: 5.9 % (ref 4.6–6.5)

## 2018-01-17 MED ORDER — VALACYCLOVIR HCL 1 G PO TABS: 1000.0000 mg | ORAL_TABLET | Freq: Three times a day (TID) | ORAL | 0 refills | Status: DC

## 2018-01-17 NOTE — Assessment & Plan Note (Signed)
Supplement and monitor 

## 2018-01-17 NOTE — Patient Instructions (Signed)

## 2018-01-17 NOTE — Assessment & Plan Note (Signed)
hgba1c acceptable, minimize simple carbs. Increase exercise as tolerated.  
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
## 2018-01-17 NOTE — Assessment & Plan Note (Signed)
On Levothyroxine, continue to monitor 

## 2018-01-17 NOTE — Assessment & Plan Note (Addendum)
she is having knee replacement done by Dr Theda Sers on right. Is ready to proceed due to debility and weakness had a fall last week

## 2018-01-17 NOTE — Assessment & Plan Note (Signed)
Check CMP.  ?

## 2018-01-18 ENCOUNTER — Other Ambulatory Visit: Payer: Medicare HMO

## 2018-01-18 DIAGNOSIS — N179 Acute kidney failure, unspecified: Secondary | ICD-10-CM

## 2018-01-18 DIAGNOSIS — R739 Hyperglycemia, unspecified: Secondary | ICD-10-CM | POA: Diagnosis not present

## 2018-01-18 LAB — MICROALBUMIN / CREATININE URINE RATIO
Creatinine,U: 258.3 mg/dL
Microalb Creat Ratio: 0.5 mg/g (ref 0.0–30.0)
Microalb, Ur: 1.4 mg/dL (ref 0.0–1.9)

## 2018-01-18 LAB — HSV TYPE I/II IGG, IGMW/ REFLEX
HSV 1 Glycoprotein G Ab, IgG: 29.4 index — ABNORMAL HIGH (ref 0.00–0.90)
HSV 1 IgM: <1:10 {titer}
HSV 2 IgG, Type Spec: 8.04 index — ABNORMAL HIGH (ref 0.00–0.90)
HSV 2 IgM: <1:10 {titer}

## 2018-01-23 DIAGNOSIS — M25551 Pain in right hip: Secondary | ICD-10-CM | POA: Diagnosis not present

## 2018-01-25 ENCOUNTER — Telehealth: Payer: Self-pay | Admitting: Adult Health

## 2018-01-25 ENCOUNTER — Other Ambulatory Visit (INDEPENDENT_AMBULATORY_CARE_PROVIDER_SITE_OTHER): Payer: Medicare HMO

## 2018-01-25 DIAGNOSIS — D582 Other hemoglobinopathies: Secondary | ICD-10-CM | POA: Diagnosis not present

## 2018-01-25 DIAGNOSIS — R748 Abnormal levels of other serum enzymes: Secondary | ICD-10-CM | POA: Diagnosis not present

## 2018-01-25 DIAGNOSIS — Z96641 Presence of right artificial hip joint: Secondary | ICD-10-CM | POA: Diagnosis not present

## 2018-01-25 DIAGNOSIS — Z471 Aftercare following joint replacement surgery: Secondary | ICD-10-CM | POA: Diagnosis not present

## 2018-01-25 DIAGNOSIS — M1611 Unilateral primary osteoarthritis, right hip: Secondary | ICD-10-CM | POA: Diagnosis not present

## 2018-01-25 LAB — CBC
HCT: 41.6 % (ref 36.0–46.0)
Hemoglobin: 14.1 g/dL (ref 12.0–15.0)
MCHC: 33.8 g/dL (ref 30.0–36.0)
MCV: 94.8 fl (ref 78.0–100.0)
Platelets: 300 10*3/uL (ref 150.0–400.0)
RBC: 4.4 Mil/uL (ref 3.87–5.11)
RDW: 13.8 % (ref 11.5–15.5)
WBC: 6.3 10*3/uL (ref 4.0–10.5)

## 2018-01-25 LAB — GAMMA GT: GGT: 15 U/L (ref 7–51)

## 2018-01-25 NOTE — Telephone Encounter (Signed)
Form has been located and placed on top of [place] laptop. I could not personally hand this to her.

## 2018-01-25 NOTE — Addendum Note (Signed)
Addended by: Trenda Moots on: 9/51/8841 11:39 AM   Modules accepted: Orders

## 2018-01-25 NOTE — Addendum Note (Signed)
Addended by: Trenda Moots on: 0/17/4944 11:39 AM   Modules accepted: Orders

## 2018-01-26 ENCOUNTER — Other Ambulatory Visit: Payer: Self-pay

## 2018-01-26 NOTE — Telephone Encounter (Signed)
Form received and given to TP

## 2018-01-26 NOTE — Telephone Encounter (Signed)
Form signed by TP Faxed back to [facility] Patient is aware Form sent to scan

## 2018-01-31 ENCOUNTER — Ambulatory Visit
Admission: RE | Admit: 2018-01-31 | Discharge: 2018-01-31 | Disposition: A | Discharge status: Home / Self Care | Payer: Medicare HMO | Source: Ambulatory Visit | Attending: Adult Health | Admitting: Adult Health

## 2018-01-31 DIAGNOSIS — R918 Other nonspecific abnormal finding of lung field: Secondary | ICD-10-CM

## 2018-02-08 NOTE — Progress Notes (Signed)
Spoke with pt and notified of results per [facility]. Pt verbalized understanding and denied any questions.

## 2018-02-11 ENCOUNTER — Other Ambulatory Visit: Payer: Self-pay | Admitting: Family

## 2018-03-08 DIAGNOSIS — M1711 Unilateral primary osteoarthritis, right knee: Secondary | ICD-10-CM | POA: Diagnosis not present

## 2018-03-15 ENCOUNTER — Encounter: Payer: Self-pay | Admitting: Medical

## 2018-03-15 ENCOUNTER — Ambulatory Visit (INDEPENDENT_AMBULATORY_CARE_PROVIDER_SITE_OTHER): Payer: Medicare HMO | Admitting: Medical

## 2018-03-15 VITALS — BP 134/73 | HR 92 | Temp 98.2°F | Resp 16 | Ht 65.0 in | Wt 158.0 lb

## 2018-03-15 DIAGNOSIS — J029 Acute pharyngitis, unspecified: Secondary | ICD-10-CM

## 2018-03-15 DIAGNOSIS — H9202 Otalgia, left ear: Secondary | ICD-10-CM | POA: Diagnosis not present

## 2018-03-15 DIAGNOSIS — J0100 Acute maxillary sinusitis, unspecified: Secondary | ICD-10-CM

## 2018-03-15 DIAGNOSIS — M79651 Pain in right thigh: Secondary | ICD-10-CM

## 2018-03-15 MED ORDER — LEVOCETIRIZINE DIHYDROCHLORIDE 5 MG PO TABS: 5.0000 mg | ORAL_TABLET | Freq: Every evening | ORAL | 3 refills | Status: DC

## 2018-03-15 MED ORDER — FLUTICASONE PROPIONATE 50 MCG/ACT NA SUSP: 2.0000 | Freq: Every day | NASAL | 1 refills | Status: DC

## 2018-03-15 MED ORDER — DOXYCYCLINE HYCLATE 100 MG PO TABS: 100.0000 mg | ORAL_TABLET | Freq: Two times a day (BID) | ORAL | 0 refills | Status: DC

## 2018-03-15 MED ORDER — VALACYCLOVIR HCL 1 G PO TABS: 1000.0000 mg | ORAL_TABLET | Freq: Three times a day (TID) | ORAL | 0 refills | Status: DC
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
## 2018-03-20 ENCOUNTER — Encounter: Payer: Self-pay | Admitting: Family Medicine

## 2018-03-20 ENCOUNTER — Ambulatory Visit (INDEPENDENT_AMBULATORY_CARE_PROVIDER_SITE_OTHER): Payer: Medicare HMO | Admitting: Family Medicine

## 2018-03-20 DIAGNOSIS — J3489 Other specified disorders of nose and nasal sinuses: Secondary | ICD-10-CM | POA: Diagnosis not present

## 2018-03-20 DIAGNOSIS — Z96641 Presence of right artificial hip joint: Secondary | ICD-10-CM | POA: Diagnosis not present

## 2018-03-20 DIAGNOSIS — M549 Dorsalgia, unspecified: Secondary | ICD-10-CM | POA: Diagnosis not present

## 2018-03-20 DIAGNOSIS — M7061 Trochanteric bursitis, right hip: Secondary | ICD-10-CM | POA: Diagnosis not present

## 2018-03-20 DIAGNOSIS — B002 Herpesviral gingivostomatitis and pharyngotonsillitis: Secondary | ICD-10-CM

## 2018-03-20 DIAGNOSIS — R739 Hyperglycemia, unspecified: Secondary | ICD-10-CM | POA: Diagnosis not present

## 2018-03-20 DIAGNOSIS — E039 Hypothyroidism, unspecified: Secondary | ICD-10-CM | POA: Diagnosis not present

## 2018-03-20 DIAGNOSIS — M25551 Pain in right hip: Secondary | ICD-10-CM | POA: Diagnosis not present

## 2018-03-20 MED ORDER — MUPIROCIN 2 % EX OINT: 1.0000 "application " | TOPICAL_OINTMENT | Freq: Every day | CUTANEOUS | 0 refills | Status: DC

## 2018-03-20 NOTE — Assessment & Plan Note (Signed)
On Levothyroxine, continue to monitor 

## 2018-03-20 NOTE — Assessment & Plan Note (Signed)
Encouraged moist heat and gentle stretching as tolerated. May try NSAIDs and prescription meds as directed and report if symptoms worsen or seek immediate care. Try muscle relaxer qhs for the pain that is keeping her up. Has cyclobenzaprine 5 mg, 1-2 tabs qhs.

## 2018-03-20 NOTE — Patient Instructions (Addendum)
  Plain Mucinex twice daily  Sinusitis, Adult Sinusitis is soreness and inflammation of your sinuses. Sinuses are hollow spaces in the bones around your face. They are located:  Around your eyes.  In the middle of your forehead.  Behind your nose.  In your cheekbones.  Your sinuses and nasal passages are lined with a stringy fluid (mucus). Mucus normally drains out of your sinuses. When your nasal tissues get inflamed or swollen, the mucus can get trapped or blocked so air cannot flow through your sinuses. This lets bacteria, viruses, and funguses grow, and that leads to infection. Follow these instructions at home: Medicines  Take, use, or apply over-the-counter and prescription medicines only as told by your doctor. These may include nasal sprays.  If you were prescribed an antibiotic medicine, take it as told by your doctor. Do not stop taking the antibiotic even if you start to feel better. Hydrate and Humidify  Drink enough water to keep your pee (urine) clear or pale yellow.  Use a cool mist humidifier to keep the humidity level in your home above 50%.  Breathe in steam for 10-15 minutes, 3-4 times a day or as told by your doctor. You can do this in the bathroom while a hot shower is running.  Try not to spend time in cool or dry air. Rest  Rest as much as possible.  Sleep with your head raised (elevated).  Make sure to get enough sleep each night. General instructions  Put a warm, moist washcloth on your face 3-4 times a day or as told by your doctor. This will help with discomfort.  Wash your hands often with soap and water. If there is no soap and water, use hand sanitizer.  Do not smoke. Avoid being around people who are smoking (secondhand smoke).  Keep all follow-up visits as told by your doctor. This is important. Contact a doctor if:  You have a fever.  Your symptoms get worse.  Your symptoms do not get better within 10 days. Get help right away  if:  You have a very bad headache.  You cannot stop throwing up (vomiting).  You have pain or swelling around your face or eyes.  You have trouble seeing.  You feel confused.  Your neck is stiff.  You have trouble breathing. This information is not intended to replace advice given to you by your health care provider. Make sure you discuss any questions you have with your health care provider. Document Released: 12/01/2007 Document Revised: 02/08/2016 Document Reviewed: 04/09/2015 Elsevier Interactive Patient Education  [employer].

## 2018-03-22 DIAGNOSIS — J3489 Other specified disorders of nose and nasal sinuses: Secondary | ICD-10-CM | POA: Insufficient documentation

## 2018-03-22 NOTE — Assessment & Plan Note (Signed)
Given rx for Mupirocin to use qhs

## 2018-03-22 NOTE — Assessment & Plan Note (Signed)
hgba1c acceptable, minimize simple carbs. Increase exercise as tolerated.  
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
## 2018-03-22 NOTE — Assessment & Plan Note (Signed)
Is improving with valtrex

## 2018-03-28 ENCOUNTER — Ambulatory Visit: Payer: Self-pay | Admitting: Family Medicine

## 2018-03-28 NOTE — Telephone Encounter (Signed)
Pt calling with c/o lightheadedness associated with nausea and vomiting that began last Friday.  Pt states that it feels like she is going to faint when these symptoms begin. Denies vertigo. Pt states that she when she has these episodes she feels like she needs to lay down. Pt denies fever, chest pain, diarrhea or bleeding.  Care advice given . Unable to get pt in to see provider within 24 hours. Next appt available 03/30/18 at 1000 with Dr Larose Kells. Reason for Disposition . [1] MODERATE dizziness (e.g., interferes with normal activities) AND [2] has NOT been evaluated by physician for this  (Exception: dizziness caused by heat exposure, sudden standing, or poor fluid intake)  Answer Assessment - Initial Assessment Questions 1. DESCRIPTION: "Describe your dizziness."     Feels like she is going to faint 2. LIGHTHEADED: "Do you feel lightheaded?" (e.g., somewhat faint, woozy, weak upon standing)     yes 3. VERTIGO: "Do you feel like either you or the room is spinning or tilting?" (i.e. vertigo)     no 4. SEVERITY: "How bad is it?"  "Do you feel like you are going to faint?" "Can you stand and walk?"   - MILD - walking normally   - MODERATE - interferes with normal activities (e.g., work, school)    - SEVERE - unable to stand, requires support to walk, feels like passing out now.      moderate 5. ONSET:  "When did the dizziness begin?"     Friday last week 6. AGGRAVATING FACTORS: "Does anything make it worse?" (e.g., standing, change in head position)     Standing, lying down 7. HEART RATE: "Can you tell me your heart rate?" "How many beats in 15 seconds?"  (Note: not all patients can do this)       Pt cannot do this  8. CAUSE: "What do you think is causing the dizziness?"     Unsure what is causing dizziness 9. RECURRENT SYMPTOM: "Have you had dizziness before?" If so, ask: "When was the last time?" "What happened that time?"     Yes-pt doesn't remember 10. OTHER SYMPTOMS: "Do you have any other  symptoms?" (e.g., fever, chest pain, vomiting, diarrhea, bleeding)       vomiting 11. PREGNANCY: "Is there any chance you are pregnant?" "When was your last menstrual period?"       n/a  Protocols used: DIZZINESS [facility]

## 2018-03-28 NOTE — Telephone Encounter (Signed)
Pt called and stated that she has been feeling dizzy for the past 4 days. Pt is not dizzy now but was dizzy this morning. Pt would like a call back. Please advise

## 2018-03-30 ENCOUNTER — Encounter: Payer: Self-pay | Admitting: Internal Medicine

## 2018-03-30 ENCOUNTER — Ambulatory Visit (INDEPENDENT_AMBULATORY_CARE_PROVIDER_SITE_OTHER): Payer: Medicare HMO | Admitting: Internal Medicine

## 2018-03-30 VITALS — BP 122/70 | HR 80 | Temp 98.2°F | Resp 16 | Ht 65.0 in | Wt 162.2 lb

## 2018-03-30 DIAGNOSIS — R040 Epistaxis: Secondary | ICD-10-CM | POA: Diagnosis not present

## 2018-03-30 DIAGNOSIS — R42 Dizziness and giddiness: Secondary | ICD-10-CM

## 2018-03-30 DIAGNOSIS — J019 Acute sinusitis, unspecified: Secondary | ICD-10-CM | POA: Diagnosis not present

## 2018-03-30 MED ORDER — ONDANSETRON 4 MG PO TBDP: 4.0000 mg | ORAL_TABLET | Freq: Three times a day (TID) | ORAL | 0 refills | Status: DC | PRN

## 2018-03-30 MED ORDER — PREDNISONE 10 MG PO TABS: ORAL_TABLET | ORAL | 0 refills | Status: DC

## 2018-03-30 NOTE — Patient Instructions (Addendum)
Rest  Drink plenty of fluids  If  cough:  Take Mucinex DM twice a day as needed until better  For nasal congestion: Use   Flonase : 2 nasal sprays on each side of the nose in the morning until you feel better Use a saline nasal spray 3 or 4 times a day  Avoid decongestants such as  Pseudoephedrine or phenylephrine   Stop Claritin  Zofran as needed  Take prednisone as prescribed Call if not gradually better over the next  10 days  Call anytime if the symptoms are severe
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
## 2018-03-30 NOTE — Progress Notes (Signed)
Pre visit review using our clinic review tool, if applicable. No additional management support is needed unless otherwise documented below in the visit note. 

## 2018-04-05 ENCOUNTER — Telehealth: Payer: Self-pay

## 2018-04-05 DIAGNOSIS — Z01419 Encounter for gynecological examination (general) (routine) without abnormal findings: Secondary | ICD-10-CM | POA: Diagnosis not present

## 2018-04-05 NOTE — Telephone Encounter (Signed)
LVM for pt reminding of SCP visit with NP on 04/11/18 at 2 pm.

## 2018-04-07 ENCOUNTER — Ambulatory Visit: Payer: Self-pay | Admitting: Family Medicine

## 2018-04-07 NOTE — Telephone Encounter (Signed)
Pt called in c/o feeling very dehydrated.   She has been treated for a recent sinus infection with antibiotics and prednisone.   She is better but still  A little congested.    She denies vomiting or having diarrhea.  Yesterday she felt some dizziness but she had this when she was seen for the sinus infection.   No dizziness today.   She takes Lasix as needed for ankle swelling.   She has not taken any Lasix for 3 nights.   I encouraged her not to take the Lasix for now while trying to rehydrate.   Her main concern was should she go to the ED for IV fluids or try to rehydrate at home.    She drank 8-10 glasses of water yesterday.   I let her know she should be able to rehydrate at home since she is not having diarrhea or vomiting. I encouraged her to continue drinking a lot of fluids and also some Gator Aid or sports drinks along with the water to help hydrate her.  She had chemo 25 years ago and was told she had some heart damage from it but has never been told she had heart failure or to watch her fluid intake.  I let her know if she is not feeling more hydrated later today after fluids and she feels she needs to go to the ED to please go.  If she starts having dizziness today to go on to the ED for IV fluids.  She asked if she could come [facility] the ED [facility].   I let her know that would be fine.  She verbalized understanding and was agreeable to this plan.     Reason for Disposition . Nursing judgment or information in reference  Answer Assessment - Initial Assessment Questions 1. REASON FOR CALL: "What is your main concern right now?"     I'm very dehydrated.   My skin is very dry and my mouth is very dry.   Should I go to the ED?     I felt dizzy several days this week from the sinus infection.   I'm being treated for it.   I had antibiotics and prednisone.   I've finished the prednisone.   No fever.   I get hot and cold. 2. ONSET: "When did the dehydration start?"     Last night  when I rolled over I was dizzy.    Yesterday I had 8-10 glasses of water.   I have heart damage from chemo.     3. SEVERITY: "How bad is the dizziness?"     No dizziness today.    4. FEVER: "Do you have a fever?"     No Fever 5. OTHER SYMPTOMS: "Do you have any other new symptoms?"     No vomiting or diarrhea.    6. INTERVENTIONS AND RESPONSE: "What have you done so far to try to make this better? What medications have you used?"     I take Lasix but not for 3 nights I take it as needed for ankle swelling.    I'm doing 1/2 water and 1/2 Gator Aid. 7. PREGNANCY: "Is there any chance you are pregnant?"     Not aske due to age  Protocols used: NO GUIDELINE AVAILABLE-A-AH

## 2018-04-11 ENCOUNTER — Encounter: Payer: Self-pay | Admitting: Adult Health

## 2018-04-11 ENCOUNTER — Inpatient Hospital Stay: Payer: Medicare HMO | Attending: Adult Health | Admitting: Adult Health

## 2018-04-11 VITALS — BP 136/67 | HR 101 | Temp 98.8°F | Resp 18 | Ht 65.0 in | Wt 163.7 lb

## 2018-04-11 DIAGNOSIS — Z170 Estrogen receptor positive status [ER+]: Secondary | ICD-10-CM

## 2018-04-11 DIAGNOSIS — C50811 Malignant neoplasm of overlapping sites of right female breast: Secondary | ICD-10-CM

## 2018-04-11 DIAGNOSIS — Z853 Personal history of malignant neoplasm of breast: Secondary | ICD-10-CM | POA: Diagnosis not present

## 2018-04-11 NOTE — Patient Instructions (Signed)
Bone Health Bones protect organs, store calcium, and anchor muscles. Good health habits, such as eating nutritious foods and exercising regularly, are important for maintaining healthy bones. They can also help to prevent a condition that causes bones to lose density and become weak and brittle (osteoporosis). Why is bone mass important? Bone mass refers to the amount of bone tissue that you have. The higher your bone mass, the stronger your bones. An important step toward having healthy bones throughout life is to have strong and dense bones during childhood. A young adult who has a high bone mass is more likely to have a high bone mass later in life. Bone mass at its greatest it is called peak bone mass. A large decline in bone mass occurs in older adults. In women, it occurs about the time of menopause. During this time, it is important to practice good health habits, because if more bone is lost than what is replaced, the bones will become less healthy and more likely to break (fracture). If you find that you have a low bone mass, you may be able to prevent osteoporosis or further bone loss by changing your diet and lifestyle. How can I find out if my bone mass is low? Bone mass can be measured with an X-ray test that is called a bone mineral density (BMD) test. This test is recommended for all women who are age 65 or older. It may also be recommended for men who are age 70 or older, or for people who are more likely to develop osteoporosis due to:  Having bones that break easily.  Having a long-term disease that weakens bones, such as kidney disease or rheumatoid arthritis.  Having menopause earlier than normal.  Taking medicine that weakens bones, such as steroids, thyroid hormones, or hormone treatment for breast cancer or prostate cancer.  Smoking.  Drinking three or more alcoholic drinks each day.  What are the nutritional recommendations for healthy bones? To have healthy bones, you  need to get enough of the right minerals and vitamins. Most nutrition experts recommend getting these nutrients from the foods that you eat. Nutritional recommendations vary from person to person. Ask your health care provider what is healthy for you. Here are some general guidelines. Calcium Recommendations Calcium is the most important (essential) mineral for bone health. Most people can get enough calcium from their diet, but supplements may be recommended for people who are at risk for osteoporosis. Good sources of calcium include:  Dairy products, such as low-fat or nonfat milk, cheese, and yogurt.  Dark green leafy vegetables, such as bok choy and broccoli.  Calcium-fortified foods, such as orange juice, cereal, bread, soy beverages, and tofu products.  Nuts, such as almonds.  Follow these recommended amounts for daily calcium intake:  Children, age 1?3: 700 mg.  Children, age 4?8: 1,000 mg.  Children, age 9?13: 1,300 mg.  Teens, age 14?18: 1,300 mg.  Adults, age 19?50: 1,000 mg.  Adults, age 51?70: ? Men: 1,000 mg. ? Women: 1,200 mg.  Adults, age 71 or older: 1,200 mg.  Pregnant and breastfeeding females: ? Teens: 1,300 mg. ? Adults: 1,000 mg.  Vitamin D Recommendations Vitamin D is the most essential vitamin for bone health. It helps the body to absorb calcium. Sunlight stimulates the skin to make vitamin D, so be sure to get enough sunlight. If you live in a cold climate or you do not get outside often, your health care provider may recommend that you take vitamin   D supplements. Good sources of vitamin D in your diet include:  Egg yolks.  Saltwater fish.  Milk and cereal fortified with vitamin D.  Follow these recommended amounts for daily vitamin D intake:  Children and teens, age 1?18: 600 international units.  Adults, age 50 or younger: 400-800 international units.  Adults, age 51 or older: 800-1,000 international units.  Other Nutrients Other nutrients  for bone health include:  Phosphorus. This mineral is found in meat, poultry, dairy foods, nuts, and legumes. The recommended daily intake for adult men and adult women is 700 mg.  Magnesium. This mineral is found in seeds, nuts, dark green vegetables, and legumes. The recommended daily intake for adult men is 400?420 mg. For adult women, it is 310?320 mg.  Vitamin K. This vitamin is found in green leafy vegetables. The recommended daily intake is 120 mg for adult men and 90 mg for adult women.  What type of physical activity is best for building and maintaining healthy bones? Weight-bearing and strength-building activities are important for building and maintaining peak bone mass. Weight-bearing activities cause muscles and bones to work against gravity. Strength-building activities increases muscle strength that supports bones. Weight-bearing and muscle-building activities include:  Walking and hiking.  Jogging and running.  Dancing.  Gym exercises.  Lifting weights.  Tennis and racquetball.  Climbing stairs.  Aerobics.  Adults should get at least 30 minutes of moderate physical activity on most days. Children should get at least 60 minutes of moderate physical activity on most days. Ask your health care provide what type of exercise is best for you. Where can I find more information? For more information, check out the following websites:  National Osteoporosis Foundation: http://nof.org/learn/basics  National Institutes of Health: http://www.niams.nih.gov/Health_Info/Bone/Bone_Health/bone_health_for_life.asp  This information is not intended to replace advice given to you by your health care provider. Make sure you discuss any questions you have with your health care provider. Document Released: 09/04/2003 Document Revised: 01/02/2016 Document Reviewed: 06/19/2014 Elsevier Interactive Patient Education  2018 [employer]  
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
## 2018-04-12 ENCOUNTER — Ambulatory Visit: Payer: Medicare HMO | Admitting: Emergency Medicine

## 2018-04-12 ENCOUNTER — Telehealth: Payer: Self-pay | Admitting: Adult Health

## 2018-04-12 ENCOUNTER — Encounter: Payer: Self-pay | Admitting: Emergency Medicine

## 2018-04-12 DIAGNOSIS — J4530 Mild persistent asthma, uncomplicated: Secondary | ICD-10-CM | POA: Diagnosis not present

## 2018-04-12 DIAGNOSIS — J301 Allergic rhinitis due to pollen: Secondary | ICD-10-CM

## 2018-04-12 DIAGNOSIS — R9389 Abnormal findings on diagnostic imaging of other specified body structures: Secondary | ICD-10-CM | POA: Diagnosis not present

## 2018-04-12 DIAGNOSIS — Z23 Encounter for immunization: Secondary | ICD-10-CM | POA: Diagnosis not present

## 2018-04-12 NOTE — Assessment & Plan Note (Signed)
Recent flaring.  Still having difficulty with some irritation and bleeding, scabs in her left nare.  Asked her to continue her fluticasone and loratadine.  She may need another ENT evaluation

## 2018-04-12 NOTE — Assessment & Plan Note (Signed)
Hazy right upper lobe opacity was cleared on her August 2019 CT scan.  No abnormality.  No indication for any further scanning.

## 2018-04-12 NOTE — Assessment & Plan Note (Signed)
Mild intermittent asthma.  She has Symbicort but is only been using it as needed.  Asked her to take this off her medicine list.  She will use albuterol as needed instead.  No flares although she was treated with prednisone recently for what sounds like allergy/sinusitis.

## 2018-04-12 NOTE — Progress Notes (Signed)
Subjective:    Patient ID: [name], female    DOB: 10-24-1955, 62 y.o.   MRN: [id number]  HPI 62 yo woman, never smoker, hx of breast CA (90's) with auto-stem tx, non-ischemic CM from chemo, CAD, allergies, IBS, HH. She was noted to have pulm toxicity from her chemo, followed by CT scan and PFT.    PULMONARY FUNCTON TEST 05/31/2008 06/24/2008 01/08/2011  FVC 3.12 3.27 2.93  FEV1 2.46 2.54 2.22  FEV1/FVC 78.8 77.7 75.8  FVC % Predicted 91 93 84  FEV % Predicted 95 95 85  FeF 25-75 2.27 2.29 1.86  FeF 25-75 % Predicted 2.94 2.98 2.92   FEV1 2.30L at [facility] 02/07/13  Myocardial perfusion Imaging 05/01/14 > negative  ROV 11/03/16 - patient has a history of prior pneumonitis as detailed above. Also with a clinical history consistent with possible asthma. Also with allergic rhinitis. She has been treated on multiple occasions for URI and AE's. She has a hard time telling me exactly what sx she was happening, describing her sx. She doesn;t believe that she was having much cough, non-productive. She felt chest tightness, was dyspneic. She was tried on Symbicort since last visit, but she was only taking prn. Believes that it was started in March. Seemed to improve with albuterol. CT chest 09/16/16 was reassuring > no infiltrates, no new findings. She has been better since March (off symbicort). She is not using flonase, she does take loratadine.   ROV 04/12/18 --[name] is a 62 and has a history of pneumonitis that was related to chemotherapy.  She has a history of breast cancer with an autologous stem cell transplant, developed nonischemic cardia myopathy from this.  She is followed here for mild intermittent asthma.  Seen most recently by T. Parrett in preop evaluation for orthopedic surgery.  She had a CT scan of her chest done to evaluate rib and chest discomfort 10/2017 that showed a small focal right upper lobe hazy opacity suggestive of atelectasis.  A repeat  scan 01/31/2018 was reviewed by me, shows interval resolution of this. She has symbicort and albuterol available, uses them prn. She is on flonase on a schedule, loratadine.  She was treated for nasal congestion and sinus pain last month with prednisone.      Objective:   Physical Exam Vitals:   04/12/18 1507  BP: 136/70  Pulse: 86  SpO2: 98%  Weight: 69.2 kg  Height: 5\' 5"  (1.651 m)   Gen: Pleasant,well-nourished, in no distress,  normal affect  ENT: No lesions,  mouth clear,  oropharynx clear, no postnasal drip  Neck: No JVD, no TMG, no carotid bruits  Lungs: No use of accessory muscles, clear without rales or rhonchi  Cardiovascular: RRR, heart sounds normal, no murmur or gallops, no peripheral edema  Musculoskeletal: No deformities, no cyanosis or clubbing  Neuro: alert, non focal  Skin: Warm, no lesions or rashes      Assessment & Plan:  Asthma Mild intermittent asthma.  She has Symbicort but is only been using it as needed.  Asked her to take this off her medicine list.  She will use albuterol as needed instead.  No flares although she was treated with prednisone recently for what sounds like allergy/sinusitis.    Allergic rhinitis Recent flaring.  Still having difficulty with some irritation and bleeding, scabs in her left nare.  Asked her to continue her fluticasone and loratadine.  She may need another ENT evaluation  Abnormal CT of the  chest Hazy right upper lobe opacity was cleared on her August 2019 CT scan.  No abnormality.  No indication for any further scanning.  Baltazar Apo, MD, PhD 04/12/2018, 3:35 PM [facility] [phone] or if no answer [phone]

## 2018-04-12 NOTE — Patient Instructions (Addendum)
Please stop using Symbicort. Keep your albuterol available to use 2 puffs if needed for shortness of breath, wheezing, chest tightness. Continue your fluticasone nasal spray, loratadine as you have been taking them. Agree with flu shot today. Your pneumonia shot is up-to-date.  You will need it again after age 62. Your CT scan of the chest from August showed full resolution of any abnormalities.  He do not need to have a repeat scan. Follow with Dr Lamonte Sakai in 12 months or sooner if you have any problems

## 2018-04-12 NOTE — Telephone Encounter (Signed)
Per 10/15 no los

## 2018-04-24 ENCOUNTER — Ambulatory Visit: Payer: Medicare HMO | Admitting: Family Medicine

## 2018-04-26 DIAGNOSIS — M7061 Trochanteric bursitis, right hip: Secondary | ICD-10-CM | POA: Diagnosis not present

## 2018-04-28 DIAGNOSIS — H0011 Chalazion right upper eyelid: Secondary | ICD-10-CM | POA: Diagnosis not present

## 2018-05-04 ENCOUNTER — Encounter: Payer: Self-pay | Admitting: Family Medicine

## 2018-05-04 ENCOUNTER — Ambulatory Visit (INDEPENDENT_AMBULATORY_CARE_PROVIDER_SITE_OTHER): Payer: Medicare HMO | Admitting: Family Medicine

## 2018-05-04 DIAGNOSIS — E785 Hyperlipidemia, unspecified: Secondary | ICD-10-CM | POA: Diagnosis not present

## 2018-05-04 DIAGNOSIS — H00011 Hordeolum externum right upper eyelid: Secondary | ICD-10-CM

## 2018-05-04 DIAGNOSIS — R000 Tachycardia, unspecified: Secondary | ICD-10-CM | POA: Diagnosis not present

## 2018-05-04 DIAGNOSIS — B958 Unspecified staphylococcus as the cause of diseases classified elsewhere: Secondary | ICD-10-CM | POA: Insufficient documentation

## 2018-05-04 DIAGNOSIS — E039 Hypothyroidism, unspecified: Secondary | ICD-10-CM

## 2018-05-04 DIAGNOSIS — R739 Hyperglycemia, unspecified: Secondary | ICD-10-CM | POA: Diagnosis not present

## 2018-05-04 DIAGNOSIS — E559 Vitamin D deficiency, unspecified: Secondary | ICD-10-CM

## 2018-05-04 DIAGNOSIS — K1370 Unspecified lesions of oral mucosa: Secondary | ICD-10-CM | POA: Diagnosis not present

## 2018-05-04 DIAGNOSIS — H00019 Hordeolum externum unspecified eye, unspecified eyelid: Secondary | ICD-10-CM | POA: Insufficient documentation

## 2018-05-04 DIAGNOSIS — J3489 Other specified disorders of nose and nasal sinuses: Secondary | ICD-10-CM | POA: Diagnosis not present

## 2018-05-04 LAB — COMPREHENSIVE METABOLIC PANEL
ALT: 18 U/L (ref 0–35)
AST: 22 U/L (ref 0–37)
Albumin: 4.3 g/dL (ref 3.5–5.2)
Alkaline Phosphatase: 130 U/L — ABNORMAL HIGH (ref 39–117)
BUN: 21 mg/dL (ref 6–23)
CO2: 29 mEq/L (ref 19–32)
Calcium: 9.4 mg/dL (ref 8.4–10.5)
Chloride: 100 mEq/L (ref 96–112)
Creatinine, Ser: 0.93 mg/dL (ref 0.40–1.20)
GFR: 65.12 mL/min (ref >60.00)
Glucose, Bld: 108 mg/dL — ABNORMAL HIGH (ref 70–99)
Potassium: 4.3 mEq/L (ref 3.5–5.1)
Sodium: 138 mEq/L (ref 135–145)
Total Bilirubin: 0.7 mg/dL (ref 0.2–1.2)
Total Protein: 6.7 g/dL (ref 6.0–8.3)

## 2018-05-04 LAB — LIPID PANEL
Cholesterol: 260 mg/dL — ABNORMAL HIGH (ref 0–200)
HDL: 50.5 mg/dL (ref >39.00)
LDL Cholesterol: 184 mg/dL — ABNORMAL HIGH (ref 0–99)
NonHDL: 209.41
Total CHOL/HDL Ratio: 5
Triglycerides: 127 mg/dL (ref 0.0–149.0)
VLDL: 25.4 mg/dL (ref 0.0–40.0)

## 2018-05-04 LAB — CBC
HCT: 45.4 % (ref 36.0–46.0)
Hemoglobin: 15.1 g/dL — ABNORMAL HIGH (ref 12.0–15.0)
MCHC: 33.4 g/dL (ref 30.0–36.0)
MCV: 96 fl (ref 78.0–100.0)
Platelets: 352 10*3/uL (ref 150.0–400.0)
RBC: 4.73 Mil/uL (ref 3.87–5.11)
RDW: 15 % (ref 11.5–15.5)
WBC: 7.4 10*3/uL (ref 4.0–10.5)

## 2018-05-04 LAB — HEMOGLOBIN A1C: Hgb A1c MFr Bld: 5.7 % (ref 4.6–6.5)

## 2018-05-04 LAB — TSH: TSH: 2.07 u[IU]/mL (ref 0.35–4.50)

## 2018-05-04 MED ORDER — VALACYCLOVIR HCL 1 G PO TABS: 1000.0000 mg | ORAL_TABLET | Freq: Two times a day (BID) | ORAL | 1 refills | Status: DC

## 2018-05-04 MED ORDER — LEVOTHYROXINE SODIUM 75 MCG PO TABS: 75.0000 ug | ORAL_TABLET | Freq: Every day | ORAL | 1 refills | Status: DC

## 2018-05-04 MED ORDER — SULFAMETHOXAZOLE-TRIMETHOPRIM 800-160 MG PO TABS: 1.0000 | ORAL_TABLET | Freq: Two times a day (BID) | ORAL | 0 refills | Status: DC

## 2018-05-04 NOTE — Assessment & Plan Note (Signed)
Hot compresses and Bactrim follow up with opthamology if does not resolve.

## 2018-05-04 NOTE — Assessment & Plan Note (Signed)
hgba1c acceptable, minimize simple carbs. Increase exercise as tolerated.  

## 2018-05-04 NOTE — Assessment & Plan Note (Signed)
Encouraged heart healthy diet, increase exercise, avoid trans fats, consider a krill oil cap daily 

## 2018-05-04 NOTE — Assessment & Plan Note (Addendum)
Mildly elevated with acute.illness

## 2018-05-04 NOTE — Assessment & Plan Note (Signed)
Supplement and monitor 

## 2018-05-04 NOTE — Assessment & Plan Note (Addendum)
Bactrim DS bid and Mupirocin ointment and repot if no improvement. Has a stye on right upper eyelid this should help as well

## 2018-05-04 NOTE — Assessment & Plan Note (Signed)
Consider MRSA with skin infections and fever will start Bactrim continue Mupirocin

## 2018-05-04 NOTE — Assessment & Plan Note (Signed)
Consider viral lesions. Given a prescription for Valtrex
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
## 2018-05-04 NOTE — Patient Instructions (Signed)

## 2018-05-04 NOTE — Assessment & Plan Note (Signed)
On Levothyroxine, continue to monitor 

## 2018-05-05 ENCOUNTER — Encounter: Payer: Self-pay | Admitting: Family Medicine
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
## 2018-05-08 ENCOUNTER — Ambulatory Visit: Payer: Medicare HMO | Admitting: *Deleted

## 2018-05-08 LAB — VITAMIN D 1,25 DIHYDROXY
Vitamin D 1, 25 (OH)2 Total: 64 pg/mL (ref 18–72)
Vitamin D2 1, 25 (OH)2: 40 pg/mL
Vitamin D3 1, 25 (OH)2: 24 pg/mL

## 2018-05-08 NOTE — Telephone Encounter (Unsigned)
Copied from [place] [id number]. Topic: Quick Communication - Appointment Cancellation >> May 08, 2018  8:14 AM Yvette Rack wrote: Patient called to cancel appointment scheduled for 05-08-18. Patient has rescheduled their appointment.  Route to department's PEC pool.

## 2018-05-08 NOTE — Telephone Encounter (Signed)
Message sent to lab to check status of Vitamin D level from 05/04/18 as it still says "collected".

## 2018-05-12 DIAGNOSIS — H5203 Hypermetropia, bilateral: Secondary | ICD-10-CM | POA: Diagnosis not present

## 2018-05-12 DIAGNOSIS — H2513 Age-related nuclear cataract, bilateral: Secondary | ICD-10-CM | POA: Diagnosis not present

## 2018-05-12 DIAGNOSIS — H0011 Chalazion right upper eyelid: Secondary | ICD-10-CM | POA: Diagnosis not present
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
## 2018-05-15 ENCOUNTER — Encounter: Payer: Self-pay | Admitting: *Deleted

## 2018-05-15 ENCOUNTER — Ambulatory Visit (INDEPENDENT_AMBULATORY_CARE_PROVIDER_SITE_OTHER): Payer: Medicare HMO | Admitting: *Deleted

## 2018-05-15 VITALS — BP 142/84 | HR 98 | Ht 65.0 in | Wt 165.4 lb

## 2018-05-15 DIAGNOSIS — Z0000 Encounter for general adult medical examination without abnormal findings: Secondary | ICD-10-CM | POA: Diagnosis not present

## 2018-05-15 NOTE — Patient Instructions (Signed)
Please schedule your next medicare wellness visit with me in 1 yr.  Keep up the great work!   [name] , Thank you for taking time to come for your Medicare Wellness Visit. I appreciate your ongoing commitment to your health goals. Please review the following plan we discussed and let me know if I can assist you in the future.   These are the goals we discussed: Goals    . Patient Stated     Continue to eat healthy and exercise regularly.       This is a list of the screening recommended for you and due dates:  Health Maintenance  Topic Date Due  . Pap Smear  05/29/2018  . Mammogram  08/13/2019  . Tetanus Vaccine  10/12/2020  . Colon Cancer Screening  02/24/2027  . Flu Shot  Completed  .  Hepatitis C: One time screening is recommended by Center for Disease Control  (CDC) for  adults born from [birth year] through 1964.   Completed  . HIV Screening  Completed    Health Maintenance for Postmenopausal Women Menopause is a normal process in which your reproductive ability comes to an end. This process happens gradually over a span of months to years, usually between the ages of 1 and 31. Menopause is complete when you have missed 12 consecutive menstrual periods. It is important to talk with your health care provider about some of the most common conditions that affect postmenopausal women, such as heart disease, cancer, and bone loss (osteoporosis). Adopting a healthy lifestyle and getting preventive care can help to promote your health and wellness. Those actions can also lower your chances of developing some of these common conditions. What should I know about menopause? During menopause, you may experience a number of symptoms, such as:  Moderate-to-severe hot flashes.  Night sweats.  Decrease in sex drive.  Mood swings.  Headaches.  Tiredness.  Irritability.  Memory problems.  Insomnia.  Choosing to treat or not to treat menopausal changes is an individual decision that  you make with your health care provider. What should I know about hormone replacement therapy and supplements? Hormone therapy products are effective for treating symptoms that are associated with menopause, such as hot flashes and night sweats. Hormone replacement carries certain risks, especially as you become older. If you are thinking about using estrogen or estrogen with progestin treatments, discuss the benefits and risks with your health care provider. What should I know about heart disease and stroke? Heart disease, heart attack, and stroke become more likely as you age. This may be due, in part, to the hormonal changes that your body experiences during menopause. These can affect how your body processes dietary fats, triglycerides, and cholesterol. Heart attack and stroke are both medical emergencies. There are many things that you can do to help prevent heart disease and stroke:  Have your blood pressure checked at least every 1-2 years. High blood pressure causes heart disease and increases the risk of stroke.  If you are 30-36 years old, ask your health care provider if you should take aspirin to prevent a heart attack or a stroke.  Do not use any tobacco products, including cigarettes, chewing tobacco, or electronic cigarettes. If you need help quitting, ask your health care provider.  It is important to eat a healthy diet and maintain a healthy weight. ? Be sure to include plenty of vegetables, fruits, low-fat dairy products, and lean protein. ? Avoid eating foods that are high in  solid fats, added sugars, or salt (sodium).  Get regular exercise. This is one of the most important things that you can do for your health. ? Try to exercise for at least 150 minutes each week. The type of exercise that you do should increase your heart rate and make you sweat. This is known as moderate-intensity exercise. ? Try to do strengthening exercises at least twice each week. Do these in addition  to the moderate-intensity exercise.  Know your numbers.Ask your health care provider to check your cholesterol and your blood glucose. Continue to have your blood tested as directed by your health care provider.  What should I know about cancer screening? There are several types of cancer. Take the following steps to reduce your risk and to catch any cancer development as early as possible. Breast Cancer  Practice breast self-awareness. ? This means understanding how your breasts normally appear and feel. ? It also means doing regular breast self-exams. Let your health care provider know about any changes, no matter how small.  If you are 75 or older, have a clinician do a breast exam (clinical breast exam or CBE) every year. Depending on your age, family history, and medical history, it may be recommended that you also have a yearly breast X-ray (mammogram).  If you have a family history of breast cancer, talk with your health care provider about genetic screening.  If you are at high risk for breast cancer, talk with your health care provider about having an MRI and a mammogram every year.  Breast cancer (BRCA) gene test is recommended for women who have family members with BRCA-related cancers. Results of the assessment will determine the need for genetic counseling and BRCA1 and for BRCA2 testing. BRCA-related cancers include these types: ? Breast. This occurs in males or females. ? Ovarian. ? Tubal. This may also be called fallopian tube cancer. ? Cancer of the abdominal or pelvic lining (peritoneal cancer). ? Prostate. ? Pancreatic.  Cervical, Uterine, and Ovarian Cancer Your health care provider may recommend that you be screened regularly for cancer of the pelvic organs. These include your ovaries, uterus, and vagina. This screening involves a pelvic exam, which includes checking for microscopic changes to the surface of your cervix (Pap test).  For women ages 21-65, health care  providers may recommend a pelvic exam and a Pap test every three years. For women ages 50-65, they may recommend the Pap test and pelvic exam, combined with testing for human papilloma virus (HPV), every five years. Some types of HPV increase your risk of cervical cancer. Testing for HPV may also be done on women of any age who have unclear Pap test results.  Other health care providers may not recommend any screening for nonpregnant women who are considered low risk for pelvic cancer and have no symptoms. Ask your health care provider if a screening pelvic exam is right for you.  If you have had past treatment for cervical cancer or a condition that could lead to cancer, you need Pap tests and screening for cancer for at least 20 years after your treatment. If Pap tests have been discontinued for you, your risk factors (such as having a new sexual partner) need to be reassessed to determine if you should start having screenings again. Some women have medical problems that increase the chance of getting cervical cancer. In these cases, your health care provider may recommend that you have screening and Pap tests more often.  If you have  a family history of uterine cancer or ovarian cancer, talk with your health care provider about genetic screening.  If you have vaginal bleeding after reaching menopause, tell your health care provider.  There are currently no reliable tests available to screen for ovarian cancer.  Lung Cancer Lung cancer screening is recommended for adults 51-38 years old who are at high risk for lung cancer because of a history of smoking. A yearly low-dose CT scan of the lungs is recommended if you:  Currently smoke.  Have a history of at least 30 pack-years of smoking and you currently smoke or have quit within the past 15 years. A pack-year is smoking an average of one pack of cigarettes per day for one year.  Yearly screening should:  Continue until it has been 15 years  since you quit.  Stop if you develop a health problem that would prevent you from having lung cancer treatment.  Colorectal Cancer  This type of cancer can be detected and can often be prevented.  Routine colorectal cancer screening usually begins at age 84 and continues through age 19.  If you have risk factors for colon cancer, your health care provider may recommend that you be screened at an earlier age.  If you have a family history of colorectal cancer, talk with your health care provider about genetic screening.  Your health care provider may also recommend using home test kits to check for hidden blood in your stool.  A small camera at the end of a tube can be used to examine your colon directly (sigmoidoscopy or colonoscopy). This is done to check for the earliest forms of colorectal cancer.  Direct examination of the colon should be repeated every 5-10 years until age 40. However, if early forms of precancerous polyps or small growths are found or if you have a family history or genetic risk for colorectal cancer, you may need to be screened more often.  Skin Cancer  Check your skin from head to toe regularly.  Monitor any moles. Be sure to tell your health care provider: ? About any new moles or changes in moles, especially if there is a change in a mole's shape or color. ? If you have a mole that is larger than the size of a pencil eraser.  If any of your family members has a history of skin cancer, especially at a young age, talk with your health care provider about genetic screening.  Always use sunscreen. Apply sunscreen liberally and repeatedly throughout the day.  Whenever you are outside, protect yourself by wearing long sleeves, pants, a wide-brimmed hat, and sunglasses.  What should I know about osteoporosis? Osteoporosis is a condition in which bone destruction happens more quickly than new bone creation. After menopause, you may be at an increased risk for  osteoporosis. To help prevent osteoporosis or the bone fractures that can happen because of osteoporosis, the following is recommended:  If you are 73-31 years old, get at least 1,000 mg of calcium and at least 600 mg of vitamin D per day.  If you are older than age 40 but younger than age 66, get at least 1,200 mg of calcium and at least 600 mg of vitamin D per day.  If you are older than age 67, get at least 1,200 mg of calcium and at least 800 mg of vitamin D per day.  Smoking and excessive alcohol intake increase the risk of osteoporosis. Eat foods that are rich in calcium and  vitamin D, and do weight-bearing exercises several times each week as directed by your health care provider. What should I know about how menopause affects my mental health? Depression may occur at any age, but it is more common as you become older. Common symptoms of depression include:  Low or sad mood.  Changes in sleep patterns.  Changes in appetite or eating patterns.  Feeling an overall lack of motivation or enjoyment of activities that you previously enjoyed.  Frequent crying spells.  Talk with your health care provider if you think that you are experiencing depression. What should I know about immunizations? It is important that you get and maintain your immunizations. These include:  Tetanus, diphtheria, and pertussis (Tdap) booster vaccine.  Influenza every year before the flu season begins.  Pneumonia vaccine.  Shingles vaccine.  Your health care provider may also recommend other immunizations. This information is not intended to replace advice given to you by your health care provider. Make sure you discuss any questions you have with your health care provider. Document Released: 08/06/2005 Document Revised: 01/02/2016 Document Reviewed: 03/18/2015 Elsevier Interactive Patient Education  2018 [employer].

## 2018-06-15 ENCOUNTER — Ambulatory Visit (INDEPENDENT_AMBULATORY_CARE_PROVIDER_SITE_OTHER): Payer: Medicare HMO | Admitting: Family Medicine

## 2018-06-15 ENCOUNTER — Encounter: Payer: Self-pay | Admitting: Family Medicine

## 2018-06-15 ENCOUNTER — Other Ambulatory Visit: Payer: Self-pay | Admitting: Family Medicine

## 2018-06-15 VITALS — BP 110/62 | HR 89 | Temp 98.5°F | Ht 65.0 in | Wt 166.5 lb

## 2018-06-15 DIAGNOSIS — L719 Rosacea, unspecified: Secondary | ICD-10-CM | POA: Diagnosis not present

## 2018-06-15 DIAGNOSIS — H0011 Chalazion right upper eyelid: Secondary | ICD-10-CM

## 2018-06-15 MED ORDER — DOXYCYCLINE HYCLATE 50 MG PO CAPS: 50.0000 mg | ORAL_CAPSULE | Freq: Two times a day (BID) | ORAL | 0 refills | Status: DC

## 2018-06-15 MED ORDER — METRONIDAZOLE 0.75 % EX GEL: 1.0000 "application " | Freq: Every day | CUTANEOUS | 0 refills | Status: DC

## 2018-06-15 NOTE — Patient Instructions (Addendum)
Call your eye doctor.  Stress can trigger your rosacea to flare.   Chalazion  A chalazion is a swelling or lump on the eyelid. It can affect the upper eyelid or the lower eyelid. What are the causes? This condition may be caused by:  Long-lasting (chronic) inflammation of the eyelid glands.  A blocked oil gland in the eyelid. What are the signs or symptoms? Symptoms of this condition include:  Swelling of the eyelid. The swelling may spread to areas around the eye.  A hard lump on the eyelid.  Blurry vision. The lump on the eyelid may make it hard to see out of the eye. How is this diagnosed? This condition is diagnosed with an examination of the eye. How is this treated? This condition is treated by applying a warm compress to the eyelid. If the condition does not improve, it may be treated with:  Medicine that is injected into the chalazion by a health care provider.  Surgery.  Medicine that is applied to the eye. Follow these instructions at home: Managing pain and swelling  Apply a warm, moist compress to the eyelid 4-6 times a day for 10-15 minutes at a time. This will help to open any blocked glands and to reduce redness and swelling.  Apply over-the-counter and prescription medicines only as told by your health care provider. General instructions  Do not touch the chalazion.  Do not try to remove the pus. Do not squeeze the chalazion or stick it with a pin or needle.  Do not rub your eyes.  Wash your hands often. Dry your hands with a clean towel.  Keep your face, scalp, and eyebrows clean.  Avoid wearing eye makeup.  If the chalazion does not break open (rupture) on its own, return to your health care provider.  Keep all follow-up appointments as told by your health care provider. This is important. Contact a health care provider if:  Your eyelid has not improved in 4 weeks.  Your eyelid is getting worse.  You have a fever.  The chalazion does not  rupture on its own after a month of home treatment. Get help right away if:  You have pain in your eye.  Your vision changes.  The chalazion becomes painful or red.  The chalazion gets bigger. Summary  A chalazion is a swelling or lump on the upper or lower eyelid.  It may be caused by chronic inflammation or a blocked oil gland.  Apply a warm, moist compress to the eyelid 4-6 times a day for 10-15 minutes at a time.  Keep your face, scalp, and eyebrows clean. This information is not intended to replace advice given to you by your health care provider. Make sure you discuss any questions you have with your health care provider. Document Released: 06/11/2000 Document Revised: 12/01/2017 Document Reviewed: 12/01/2017 Elsevier Interactive Patient Education  2019 [place].  Rosacea Rosacea is a long-term (chronic) condition that affects the skin of the face, including the cheeks, nose, forehead, and chin. This condition can also affect the eyes. Rosacea causes blood vessels near the surface of the skin to enlarge, which results in redness. What are the causes? The cause of this condition is not known. Certain triggers can make rosacea worse, including:  Hot baths.  Exercise.  Sunlight.  Very hot or cold temperatures.  Hot or spicy foods and drinks.  Drinking alcohol.  Stress.  Taking blood pressure medicine.  Long-term use of topical steroids on the face. What  increases the risk? You are more likely to develop this condition if you:  Are older than 61 years of age.  Are a woman.  Have light-colored skin (light complexion).  Have a family history of rosacea. What are the signs or symptoms? Symptoms of this condition include:  Redness of the face.  Red bumps or pimples on the face.  A red, enlarged nose.  Blushing easily.  Red lines on the skin.  Irritated, burning, or itchy feeling in the eyes.  Swollen eyelids.  Drainage from the  eyes.  Feeling like there is something in your eye. How is this diagnosed? This condition is diagnosed with a medical history and physical exam. How is this treated? There is no cure for this condition, but treatment can help to control your symptoms. Your health care provider may recommend that you see a skin specialist (dermatologist). Treatment may include:  Medicines that are applied to the skin or taken by mouth (orally). This can include antibiotic medicines.  Laser treatment to improve the appearance of the skin.  Surgery. This is rare. Your health care provider will also recommend the best way to take care of your skin. Even after your skin improves, you will likely need to continue treatment to prevent your rosacea from coming back. Follow these instructions at home: Skin care Take care of your skin as told by your health care provider. You may be told to do these things:  Wash your skin gently two or more times each day.  Use mild soap.  Use a sunscreen or sunblock with SPF 30 or greater.  Use gentle cosmetics that are meant for sensitive skin.  Shave with an electric shaver instead of a blade. Lifestyle  Try to keep track of what foods trigger this condition. Avoid any triggers. These may include: ? Spicy foods. ? Seafood. ? Cheese. ? Hot liquids. ? Nuts. ? Chocolate. ? Iodized salt.  Do not drink alcohol.  Avoid extremely cold or hot temperatures.  Try to reduce your stress. If you need help, talk with your health care provider.  When you exercise, do these things to stay cool: ? Limit sun exposure to your face. ? Use a fan. ? Do shorter and more frequent intervals of exercise. General instructions  Take and apply over-the-counter and prescription medicines only as told by your health care provider.  If you were prescribed an antibiotic medicine, apply it or take it as told by your health care provider. Do not stop using the antibiotic even if your  condition improves.  If your eyelids are affected, apply warm compresses to them. Do this as told by your health care provider.  Keep all follow-up visits as told by your health care provider. This is important. Contact a health care provider if:  Your symptoms get worse.  Your symptoms do not improve after 2 months of treatment.  You have new symptoms.  You have any changes in vision or you have problems with your eyes, such as redness or itching.  You feel depressed.  You lose your appetite.  You have trouble concentrating. Summary  Rosacea is a long-term (chronic) condition that affects the skin of the face, including the cheeks, nose, forehead, and chin.  Take care of your skin as told by your health care provider.  Take and apply over-the-counter and prescription medicines only as told by your health care provider.  Contact a health care provider if your symptoms get worse or if you have any changes  in vision or other problems with your eyes, such as redness or itching.  Keep all follow-up visits as told by your health care provider. This is important. This information is not intended to replace advice given to you by your health care provider. Make sure you discuss any questions you have with your health care provider. Document Released: 07/22/2004 Document Revised: 11/16/2017 Document Reviewed: 11/16/2017 Elsevier Interactive Patient Education  2019 [employer].
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
## 2018-06-15 NOTE — Progress Notes (Signed)
Pre visit review using our clinic review tool, if applicable. No additional management support is needed unless otherwise documented below in the visit note. 

## 2018-06-22 ENCOUNTER — Telehealth: Payer: Self-pay

## 2018-06-22 NOTE — Telephone Encounter (Signed)
Received fax from [employer] requesting atlernative to metronidazole gel for rosacea, and Dr. Nani Ravens already prescribed alternative doxycycline. No need for further follow-up.

## 2018-07-08 ENCOUNTER — Other Ambulatory Visit: Payer: Self-pay | Admitting: Family Medicine

## 2018-07-10 ENCOUNTER — Ambulatory Visit (INDEPENDENT_AMBULATORY_CARE_PROVIDER_SITE_OTHER): Payer: Medicare HMO | Admitting: Family Medicine

## 2018-07-10 DIAGNOSIS — E559 Vitamin D deficiency, unspecified: Secondary | ICD-10-CM

## 2018-07-10 DIAGNOSIS — E039 Hypothyroidism, unspecified: Secondary | ICD-10-CM

## 2018-07-10 DIAGNOSIS — R69 Illness, unspecified: Secondary | ICD-10-CM | POA: Diagnosis not present

## 2018-07-10 DIAGNOSIS — L719 Rosacea, unspecified: Secondary | ICD-10-CM | POA: Diagnosis not present

## 2018-07-10 DIAGNOSIS — R000 Tachycardia, unspecified: Secondary | ICD-10-CM | POA: Diagnosis not present

## 2018-07-10 DIAGNOSIS — F418 Other specified anxiety disorders: Secondary | ICD-10-CM

## 2018-07-10 MED ORDER — FLUOXETINE HCL 10 MG PO TABS: 5.0000 mg | ORAL_TABLET | Freq: Every day | ORAL | 1 refills | Status: DC

## 2018-07-10 MED ORDER — ONDANSETRON 4 MG PO TBDP: 4.0000 mg | ORAL_TABLET | Freq: Three times a day (TID) | ORAL | 1 refills | Status: DC | PRN

## 2018-07-10 NOTE — Patient Instructions (Addendum)
Shingrix 2 shots over 2-6 months, call insurance regarding coverage and document  Rosacea Rosacea is a long-term (chronic) condition that affects the skin of the face, including the cheeks, nose, forehead, and chin. This col dition can also affect the eyes. Rosacea causes blood vessels near the surface of the skin to enlarge, which results in redness. What are the causes? The cause of this condition is not known. Certain triggers can make rosacea worse, including:  Hot baths.  Exercise.  Sunlight.  Very hot or cold temperatures.  Hot or spicy foods and drinks.  Drinking alcohol.  Stress.  Taking blood pressure medicine.  Long-term use of topical steroids on the face. What increases the risk? You are more likely to develop this condition if you:  Are older than 62 years of age.  Are a woman.  Have light-colored skin (light complexion).  Have a family history of rosacea. What are the signs or symptoms? Symptoms of this condition include:  Redness of the face.  Red bumps or pimples on the face.  A red, enlarged nose.  Blushing easily.  Red lines on the skin.  Irritated, burning, or itchy feeling in the eyes.  Swollen eyelids.  Drainage from the eyes.  Feeling like there is something in your eye. How is this diagnosed? This condition is diagnosed with a medical history and physical exam. How is this treated? There is no cure for this condition, but treatment can help to control your symptoms. Your health care provider may recommend that you see a skin specialist (dermatologist). Treatment may include:  Medicines that are applied to the skin or taken by mouth (orally). This can include antibiotic medicines.  Laser treatment to improve the appearance of the skin.  Surgery. This is rare. Your health care provider will also recommend the best way to take care of your skin. Even after your skin improves, you will likely need to continue treatment to prevent your  rosacea from coming back. Follow these instructions at home: Skin care Take care of your skin as told by your health care provider. You may be told to do these things:  Wash your skin gently two or more times each day.  Use mild soap.  Use a sunscreen or sunblock with SPF 30 or greater.  Use gentle cosmetics that are meant for sensitive skin.  Shave with an electric shaver instead of a blade. Lifestyle  Try to keep track of what foods trigger this condition. Avoid any triggers. These may include: ? Spicy foods. ? Seafood. ? Cheese. ? Hot liquids. ? Nuts. ? Chocolate. ? Iodized salt.  Do not drink alcohol.  Avoid extremely cold or hot temperatures.  Try to reduce your stress. If you need help, talk with your health care provider.  When you exercise, do these things to stay cool: ? Limit sun exposure to your face. ? Use a fan. ? Do shorter and more frequent intervals of exercise. General instructions  Take and apply over-the-counter and prescription medicines only as told by your health care provider.  If you were prescribed an antibiotic medicine, apply it or take it as told by your health care provider. Do not stop using the antibiotic even if your condition improves.  If your eyelids are affected, apply warm compresses to them. Do this as told by your health care provider.  Keep all follow-up visits as told by your health care provider. This is important. Contact a health care provider if:  Your symptoms get worse.  Your symptoms do not improve after 2 months of treatment.  You have new symptoms.  You have any changes in vision or you have problems with your eyes, such as redness or itching.  You feel depressed.  You lose your appetite.  You have trouble concentrating. Summary  Rosacea is a long-term (chronic) condition that affects the skin of the face, including the cheeks, nose, forehead, and chin.  Take care of your skin as told by your health care  provider.  Take and apply over-the-counter and prescription medicines only as told by your health care provider.  Contact a health care provider if your symptoms get worse or if you have any changes in vision or other problems with your eyes, such as redness or itching.  Keep all follow-up visits as told by your health care provider. This is important. This information is not intended to replace advice given to you by your health care provider. Make sure you discuss any questions you have with your health care provider. Document Released: 07/22/2004 Document Revised: 11/16/2017 Document Reviewed: 11/16/2017 Elsevier Interactive Patient Education  2019 [employer].

## 2018-07-10 NOTE — Assessment & Plan Note (Signed)
Good response to Doxycyline. No current symptoms

## 2018-07-12 DIAGNOSIS — M189 Osteoarthritis of first carpometacarpal joint, unspecified: Secondary | ICD-10-CM | POA: Diagnosis not present

## 2018-07-12 DIAGNOSIS — M1811 Unilateral primary osteoarthritis of first carpometacarpal joint, right hand: Secondary | ICD-10-CM | POA: Diagnosis not present

## 2018-07-12 DIAGNOSIS — M79641 Pain in right hand: Secondary | ICD-10-CM | POA: Diagnosis not present
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
## 2018-07-12 NOTE — Assessment & Plan Note (Signed)
On Levothyroxine, continue to monitor 

## 2018-07-12 NOTE — Assessment & Plan Note (Signed)
Doing some better but still having to manage her husband and her health struggles. No changes today

## 2018-07-12 NOTE — Assessment & Plan Note (Signed)
Supplement and monitor 

## 2018-07-12 NOTE — Assessment & Plan Note (Signed)
RRR today 

## 2018-07-14 ENCOUNTER — Ambulatory Visit (INDEPENDENT_AMBULATORY_CARE_PROVIDER_SITE_OTHER): Payer: Medicare HMO | Admitting: Medical

## 2018-07-14 ENCOUNTER — Encounter: Payer: Self-pay | Admitting: Medical

## 2018-07-14 VITALS — BP 138/69 | HR 79 | Temp 98.0°F | Resp 16 | Ht 65.0 in | Wt 173.8 lb

## 2018-07-14 DIAGNOSIS — E785 Hyperlipidemia, unspecified: Secondary | ICD-10-CM

## 2018-07-14 DIAGNOSIS — L600 Ingrowing nail: Secondary | ICD-10-CM

## 2018-07-14 DIAGNOSIS — I6523 Occlusion and stenosis of bilateral carotid arteries: Secondary | ICD-10-CM | POA: Diagnosis not present

## 2018-07-14 DIAGNOSIS — R42 Dizziness and giddiness: Secondary | ICD-10-CM | POA: Diagnosis not present

## 2018-07-14 LAB — CBC WITH DIFFERENTIAL/PLATELET
Basophils Absolute: 0 10*3/uL (ref 0.0–0.1)
Basophils Relative: 0.5 % (ref 0.0–3.0)
Eosinophils Absolute: 0.1 10*3/uL (ref 0.0–0.7)
Eosinophils Relative: 1.1 % (ref 0.0–5.0)
HCT: 42.6 % (ref 36.0–46.0)
Hemoglobin: 14.1 g/dL (ref 12.0–15.0)
Lymphocytes Relative: 29.3 % (ref 12.0–46.0)
Lymphs Abs: 2.5 10*3/uL (ref 0.7–4.0)
MCHC: 33 g/dL (ref 30.0–36.0)
MCV: 96.9 fl (ref 78.0–100.0)
Monocytes Absolute: 0.7 10*3/uL (ref 0.1–1.0)
Monocytes Relative: 8 % (ref 3.0–12.0)
Neutro Abs: 5.3 10*3/uL (ref 1.4–7.7)
Neutrophils Relative %: 61.1 % (ref 43.0–77.0)
Platelets: 290 10*3/uL (ref 150.0–400.0)
RBC: 4.4 Mil/uL (ref 3.87–5.11)
RDW: 13.5 % (ref 11.5–15.5)
WBC: 8.6 10*3/uL (ref 4.0–10.5)

## 2018-07-14 LAB — COMPREHENSIVE METABOLIC PANEL
ALT: 13 U/L (ref 0–35)
AST: 18 U/L (ref 0–37)
Albumin: 3.9 g/dL (ref 3.5–5.2)
Alkaline Phosphatase: 101 U/L (ref 39–117)
BUN: 23 mg/dL (ref 6–23)
CO2: 30 mEq/L (ref 19–32)
Calcium: 9.4 mg/dL (ref 8.4–10.5)
Chloride: 105 mEq/L (ref 96–112)
Creatinine, Ser: 1.02 mg/dL (ref 0.40–1.20)
GFR: 55.04 mL/min — ABNORMAL LOW (ref >60.00)
Glucose, Bld: 86 mg/dL (ref 70–99)
Potassium: 5.2 mEq/L — ABNORMAL HIGH (ref 3.5–5.1)
Sodium: 141 mEq/L (ref 135–145)
Total Bilirubin: 0.4 mg/dL (ref 0.2–1.2)
Total Protein: 6.2 g/dL (ref 6.0–8.3)

## 2018-07-14 MED ORDER — DOXYCYCLINE HYCLATE 100 MG PO TABS: 100.0000 mg | ORAL_TABLET | Freq: Two times a day (BID) | ORAL | 0 refills | Status: DC

## 2018-07-14 MED ORDER — EZETIMIBE 10 MG PO TABS: 10.0000 mg | ORAL_TABLET | Freq: Every day | ORAL | 3 refills | Status: DC

## 2018-07-14 NOTE — Patient Instructions (Addendum)
You do appear to have small ingrown toenail on your second toe.  By description probable low-grade infection as well.  I will give doxycycline 100mg  prescription to take twice daily for the next 7 days.  You can also apply your mupirocin on topical antibiotic daily lateral nail edge twice daily.  Also recommend that you do warm salt water soaks twice daily to affected foot.  You mention possibility of tiny wart at the tip of your toe.  I would not recommend taking treating this area presently.  When your toe does feel better you might do brief over-the-counter cryo freeze of that area.  If toenail continues to irritate you then might need to refer you to podiatrist.  As  You do have intermittent vertigo turning her head to the left.  Normal neurologic exam today.  Then symptoms resolved very quickly.  With your history of carotid atherosclerosis will try to get ultrasound done today or hopefully at the latest by Monday.  If percentage were to exceed 75% then would refer you to vascular surgeon.  Would also ask that you get CBC and metabolic panel today.  Might in the near future have to refer you to PT for Epley maneuvers if vertigo were to persist.  If you have any gross motor or sensory function deficits associated with dizziness or vertigo over the weekend then advised ED evaluation.  Follow-up in 7 days or as needed.
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
## 2018-07-15 ENCOUNTER — Encounter: Payer: Self-pay | Admitting: Medical

## 2018-07-17 ENCOUNTER — Ambulatory Visit (HOSPITAL_BASED_OUTPATIENT_CLINIC_OR_DEPARTMENT_OTHER)
Admission: RE | Admit: 2018-07-17 | Discharge: 2018-07-17 | Disposition: A | Discharge status: Home / Self Care | Payer: Medicare HMO | Source: Ambulatory Visit | Attending: Medical | Admitting: Medical

## 2018-07-17 DIAGNOSIS — E785 Hyperlipidemia, unspecified: Secondary | ICD-10-CM | POA: Diagnosis not present

## 2018-07-17 DIAGNOSIS — I6523 Occlusion and stenosis of bilateral carotid arteries: Secondary | ICD-10-CM | POA: Diagnosis not present

## 2018-07-17 DIAGNOSIS — R42 Dizziness and giddiness: Secondary | ICD-10-CM

## 2018-07-21 ENCOUNTER — Encounter: Payer: Self-pay | Admitting: Medical

## 2018-07-21 ENCOUNTER — Ambulatory Visit (INDEPENDENT_AMBULATORY_CARE_PROVIDER_SITE_OTHER): Payer: Medicare HMO | Admitting: Medical

## 2018-07-21 VITALS — BP 144/69 | HR 102 | Temp 97.6°F | Resp 16 | Ht 65.0 in | Wt 165.2 lb

## 2018-07-21 DIAGNOSIS — R112 Nausea with vomiting, unspecified: Secondary | ICD-10-CM

## 2018-07-21 DIAGNOSIS — R42 Dizziness and giddiness: Secondary | ICD-10-CM | POA: Diagnosis not present

## 2018-07-21 DIAGNOSIS — M2550 Pain in unspecified joint: Secondary | ICD-10-CM

## 2018-07-21 DIAGNOSIS — L600 Ingrowing nail: Secondary | ICD-10-CM

## 2018-07-21 LAB — CBC WITH DIFFERENTIAL/PLATELET
Basophils Absolute: 0.1 10*3/uL (ref 0.0–0.1)
Basophils Relative: 0.9 % (ref 0.0–3.0)
Eosinophils Absolute: 0.1 10*3/uL (ref 0.0–0.7)
Eosinophils Relative: 1.1 % (ref 0.0–5.0)
HCT: 46.8 % — ABNORMAL HIGH (ref 36.0–46.0)
Hemoglobin: 15.5 g/dL — ABNORMAL HIGH (ref 12.0–15.0)
Lymphocytes Relative: 21.6 % (ref 12.0–46.0)
Lymphs Abs: 1.6 10*3/uL (ref 0.7–4.0)
MCHC: 33.1 g/dL (ref 30.0–36.0)
MCV: 95.8 fl (ref 78.0–100.0)
Monocytes Absolute: 0.6 10*3/uL (ref 0.1–1.0)
Monocytes Relative: 8.4 % (ref 3.0–12.0)
Neutro Abs: 5.1 10*3/uL (ref 1.4–7.7)
Neutrophils Relative %: 68 % (ref 43.0–77.0)
Platelets: 343 10*3/uL (ref 150.0–400.0)
RBC: 4.89 Mil/uL (ref 3.87–5.11)
RDW: 13.7 % (ref 11.5–15.5)
WBC: 7.5 10*3/uL (ref 4.0–10.5)

## 2018-07-21 LAB — COMPREHENSIVE METABOLIC PANEL
ALT: 17 U/L (ref 0–35)
AST: 23 U/L (ref 0–37)
Albumin: 4.4 g/dL (ref 3.5–5.2)
Alkaline Phosphatase: 127 U/L — ABNORMAL HIGH (ref 39–117)
BUN: 20 mg/dL (ref 6–23)
CO2: 30 mEq/L (ref 19–32)
Calcium: 10 mg/dL (ref 8.4–10.5)
Chloride: 102 mEq/L (ref 96–112)
Creatinine, Ser: 1.07 mg/dL (ref 0.40–1.20)
GFR: 52.08 mL/min — ABNORMAL LOW (ref >60.00)
Glucose, Bld: 99 mg/dL (ref 70–99)
Potassium: 4.4 mEq/L (ref 3.5–5.1)
Sodium: 140 mEq/L (ref 135–145)
Total Bilirubin: 0.8 mg/dL (ref 0.2–1.2)
Total Protein: 7.2 g/dL (ref 6.0–8.3)

## 2018-07-21 LAB — SEDIMENTATION RATE: Sed Rate: 5 mm/hr (ref 0–30)

## 2018-07-21 LAB — C-REACTIVE PROTEIN: CRP: <1 mg/dL (ref 0.5–20.0)

## 2018-07-21 MED ORDER — ONDANSETRON 8 MG PO TBDP: 8.0000 mg | ORAL_TABLET | Freq: Three times a day (TID) | ORAL | 0 refills | Status: DC | PRN
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
## 2018-07-21 NOTE — Patient Instructions (Signed)
Your ingrown toenail on left second toe appears to have resolved.  I did think you had low level skin infection and by inspection looks better.  At this point would recommend no further treatment except being cautious on how you trim your nails.  Better to have a little bit longer now than to treatment to closely and because ingrown nail.  For persistent intermittent vertigo when turning your head left while lying supine, I will refer you to PT for evaluation and treatment/Epley maneuvers.  You had recent 1 night of upset stomach and vomiting.  Possible side effect of doxycycline versus early GI illness.  You stopped doxycycline.  At this point would recommend bland foods and to hydrate well.  Zofran prescription given.  Would try the 8 mg dose.  Will get metabolic panel and CBC.  If signs and symptoms worsen or change please let us know.  For history of arthralgias, did get inflammatory lab studies today.  Follow-up date to be determined after lab review.  Or as needed depending on how you do with above conditions.

## 2018-07-24 LAB — ANA: Anti Nuclear Antibody(ANA): NEGATIVE

## 2018-07-24 LAB — RHEUMATOID FACTOR: Rheumatoid fact SerPl-aCnc: <14 IU/mL (ref <14)

## 2018-07-24 NOTE — Telephone Encounter (Signed)
Copied from [place] [id number]. Topic: Quick Communication - See Telephone Encounter >> Jul 24, 2018 10:23 AM Loma [place] wrote: CRM for notification. See Telephone encounter for: 07/24/18. Jarrett Soho with [facility] has PT that is [place] she needs a script for Physical Therapy sent over to [phone]

## 2018-08-07 ENCOUNTER — Ambulatory Visit (INDEPENDENT_AMBULATORY_CARE_PROVIDER_SITE_OTHER): Payer: Medicare HMO | Admitting: Family Medicine

## 2018-08-07 VITALS — BP 140/82 | HR 90 | Temp 98.0°F | Resp 18 | Wt 169.6 lb

## 2018-08-07 DIAGNOSIS — L299 Pruritus, unspecified: Secondary | ICD-10-CM | POA: Diagnosis not present

## 2018-08-07 DIAGNOSIS — M79644 Pain in right finger(s): Secondary | ICD-10-CM | POA: Insufficient documentation

## 2018-08-07 DIAGNOSIS — L989 Disorder of the skin and subcutaneous tissue, unspecified: Secondary | ICD-10-CM | POA: Insufficient documentation

## 2018-08-07 DIAGNOSIS — E559 Vitamin D deficiency, unspecified: Secondary | ICD-10-CM

## 2018-08-07 DIAGNOSIS — D582 Other hemoglobinopathies: Secondary | ICD-10-CM

## 2018-08-07 DIAGNOSIS — K5900 Constipation, unspecified: Secondary | ICD-10-CM | POA: Diagnosis not present

## 2018-08-07 DIAGNOSIS — R42 Dizziness and giddiness: Secondary | ICD-10-CM | POA: Diagnosis not present

## 2018-08-07 DIAGNOSIS — E785 Hyperlipidemia, unspecified: Secondary | ICD-10-CM | POA: Diagnosis not present

## 2018-08-07 DIAGNOSIS — E039 Hypothyroidism, unspecified: Secondary | ICD-10-CM | POA: Diagnosis not present

## 2018-08-07 DIAGNOSIS — N189 Chronic kidney disease, unspecified: Secondary | ICD-10-CM | POA: Insufficient documentation

## 2018-08-07 DIAGNOSIS — L578 Other skin changes due to chronic exposure to nonionizing radiation: Secondary | ICD-10-CM

## 2018-08-07 DIAGNOSIS — R739 Hyperglycemia, unspecified: Secondary | ICD-10-CM | POA: Diagnosis not present

## 2018-08-07 DIAGNOSIS — R000 Tachycardia, unspecified: Secondary | ICD-10-CM

## 2018-08-07 LAB — CBC WITH DIFFERENTIAL/PLATELET
Basophils Absolute: 0.1 10*3/uL (ref 0.0–0.1)
Basophils Relative: 0.9 % (ref 0.0–3.0)
Eosinophils Absolute: 0.1 10*3/uL (ref 0.0–0.7)
Eosinophils Relative: 1.7 % (ref 0.0–5.0)
HCT: 44.9 % (ref 36.0–46.0)
Hemoglobin: 15 g/dL (ref 12.0–15.0)
Lymphocytes Relative: 23.7 % (ref 12.0–46.0)
Lymphs Abs: 1.9 10*3/uL (ref 0.7–4.0)
MCHC: 33.5 g/dL (ref 30.0–36.0)
MCV: 94.9 fl (ref 78.0–100.0)
Monocytes Absolute: 0.7 10*3/uL (ref 0.1–1.0)
Monocytes Relative: 9 % (ref 3.0–12.0)
Neutro Abs: 5.2 10*3/uL (ref 1.4–7.7)
Neutrophils Relative %: 64.7 % (ref 43.0–77.0)
Platelets: 317 10*3/uL (ref 150.0–400.0)
RBC: 4.73 Mil/uL (ref 3.87–5.11)
RDW: 13.7 % (ref 11.5–15.5)
WBC: 8 10*3/uL (ref 4.0–10.5)

## 2018-08-07 LAB — COMPREHENSIVE METABOLIC PANEL
ALT: 17 U/L (ref 0–35)
AST: 24 U/L (ref 0–37)
Albumin: 4.2 g/dL (ref 3.5–5.2)
Alkaline Phosphatase: 118 U/L — ABNORMAL HIGH (ref 39–117)
BUN: 19 mg/dL (ref 6–23)
CO2: 26 mEq/L (ref 19–32)
Calcium: 8.9 mg/dL (ref 8.4–10.5)
Chloride: 101 mEq/L (ref 96–112)
Creatinine, Ser: 0.88 mg/dL (ref 0.40–1.20)
GFR: 65.25 mL/min (ref >60.00)
Glucose, Bld: 85 mg/dL (ref 70–99)
Potassium: 4.4 mEq/L (ref 3.5–5.1)
Sodium: 137 mEq/L (ref 135–145)
Total Bilirubin: 0.7 mg/dL (ref 0.2–1.2)
Total Protein: 6.6 g/dL (ref 6.0–8.3)

## 2018-08-07 LAB — LIPID PANEL
Cholesterol: 216 mg/dL — ABNORMAL HIGH (ref 0–200)
HDL: 49.5 mg/dL (ref >39.00)
LDL Cholesterol: 143 mg/dL — ABNORMAL HIGH (ref 0–99)
NonHDL: 166.2
Total CHOL/HDL Ratio: 4
Triglycerides: 116 mg/dL (ref 0.0–149.0)
VLDL: 23.2 mg/dL (ref 0.0–40.0)

## 2018-08-07 LAB — TSH: TSH: 1.45 u[IU]/mL (ref 0.35–4.50)

## 2018-08-07 LAB — HEMOGLOBIN A1C: Hgb A1c MFr Bld: 5.9 % (ref 4.6–6.5)

## 2018-08-07 NOTE — Assessment & Plan Note (Signed)
Intermittent and positional. Referred to PT for vertigo and encouraged to hydrate well and continue protein every 4-5 hours to stabilize blood sugars

## 2018-08-07 NOTE — Assessment & Plan Note (Signed)
Right hip is severe and she is preparing to have a knee replacement but has to wait til her husband is working again. Encouraged moist heat and gentle stretching as tolerated. May try NSAIDs and prescription meds as directed and report if symptoms worsen or seek immediate care

## 2018-08-07 NOTE — Assessment & Plan Note (Signed)
Hydrate and monitor 

## 2018-08-07 NOTE — Assessment & Plan Note (Addendum)
Has only two small spots on her back but itches all over. No new products recently. Use Zyrtec and Pepcid bid and hydrate better. Has been referred to dermatology

## 2018-08-07 NOTE — Assessment & Plan Note (Signed)
Encouraged increased hydration and fiber in diet. Daily probiotics. If bowels not moving can use MOM 2 tbls po in 4 oz of warm prune juice by mouth every 2-3 days. If no results then repeat in 4 hours with  Dulcolax suppository pr, may repeat again in 4 more hours as needed. Seek care if symptoms worsen. Consider daily Miralax and/or Dulcolax if symptoms persist. Consider Miralax with Benefiber daily
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
## 2018-08-07 NOTE — Assessment & Plan Note (Signed)
Noted by patient for a couple of weeks and is scaly and is growing. Referred to [facility] derm for further consideration

## 2018-08-07 NOTE — Assessment & Plan Note (Signed)
hgba1c acceptable, minimize simple carbs. Increase exercise as tolerated. Continue current meds 

## 2018-08-07 NOTE — Assessment & Plan Note (Signed)
No injury but was recently seen by orthopaedics and significant degeneration is noted. Arthritic work up negative for RA. She has an appt with hand specialist soon. Encouraged moist heat and gentle stretching as tolerated. May try NSAIDs and prescription meds as directed and report if symptoms worsen or seek immediate care

## 2018-08-07 NOTE — Assessment & Plan Note (Signed)
Supplement and monitor 

## 2018-08-07 NOTE — Assessment & Plan Note (Signed)
Mild today

## 2018-08-07 NOTE — Patient Instructions (Addendum)
Shingrix is the new shingles shot, 2 shots over 2-6 months. You can get it at the pharmacy.  Encouraged increased hydration and fiber in diet. Daily probiotics. If bowels not moving can use MOM 2 tbls po in 4 oz of warm prune juice by mouth every 2-3 days. If no results then repeat in 4 hours with  Dulcolax suppository pr, may repeat again in 4 more hours as needed. Seek care if symptoms worsen. Consider daily Miralax and/or Dulcolax if symptoms persist.  Try mixing Miralax and Benefiber together daily and see if that helps  Anti itch Sarna lotion  Claritin/Loratadine twice daily and can add Benadryl 1 tab at bed as needed If still itching then add Famotidine/Pepcid 20 mg tabs twice daily   Hydrate well with 60-80 oz of water daily

## 2018-08-07 NOTE — Assessment & Plan Note (Signed)
On Levothyroxine, continue to monitor 

## 2018-08-09 DIAGNOSIS — N644 Mastodynia: Secondary | ICD-10-CM | POA: Diagnosis not present

## 2018-08-10 ENCOUNTER — Telehealth: Payer: Self-pay | Admitting: *Deleted

## 2018-08-10 ENCOUNTER — Other Ambulatory Visit: Payer: Self-pay | Admitting: Obstetrics & Gynecology

## 2018-08-10 DIAGNOSIS — N6002 Solitary cyst of left breast: Secondary | ICD-10-CM

## 2018-08-10 LAB — VITAMIN D 1,25 DIHYDROXY
Vitamin D 1, 25 (OH)2 Total: 53 pg/mL (ref 18–72)
Vitamin D2 1, 25 (OH)2: 42 pg/mL
Vitamin D3 1, 25 (OH)2: 11 pg/mL

## 2018-08-10 NOTE — Telephone Encounter (Signed)
Received Medical records from [facility]; forwarded to provider/SLS 02/13

## 2018-08-11 DIAGNOSIS — R262 Difficulty in walking, not elsewhere classified: Secondary | ICD-10-CM | POA: Diagnosis not present

## 2018-08-11 DIAGNOSIS — M2560 Stiffness of unspecified joint, not elsewhere classified: Secondary | ICD-10-CM | POA: Diagnosis not present

## 2018-08-11 DIAGNOSIS — R531 Weakness: Secondary | ICD-10-CM | POA: Diagnosis not present

## 2018-08-11 DIAGNOSIS — R42 Dizziness and giddiness: Secondary | ICD-10-CM | POA: Diagnosis not present

## 2018-08-16 ENCOUNTER — Ambulatory Visit
Admission: RE | Admit: 2018-08-16 | Discharge: 2018-08-16 | Disposition: A | Discharge status: Home / Self Care | Payer: Medicare HMO | Source: Ambulatory Visit | Attending: Obstetrics & Gynecology | Admitting: Obstetrics & Gynecology

## 2018-08-16 ENCOUNTER — Telehealth: Payer: Self-pay | Admitting: *Deleted

## 2018-08-16 DIAGNOSIS — R42 Dizziness and giddiness: Secondary | ICD-10-CM | POA: Diagnosis not present

## 2018-08-16 DIAGNOSIS — N6002 Solitary cyst of left breast: Secondary | ICD-10-CM

## 2018-08-16 DIAGNOSIS — R531 Weakness: Secondary | ICD-10-CM | POA: Diagnosis not present

## 2018-08-16 DIAGNOSIS — R262 Difficulty in walking, not elsewhere classified: Secondary | ICD-10-CM | POA: Diagnosis not present

## 2018-08-16 DIAGNOSIS — R922 Inconclusive mammogram: Secondary | ICD-10-CM | POA: Diagnosis not present

## 2018-08-16 DIAGNOSIS — N6311 Unspecified lump in the right breast, upper outer quadrant: Secondary | ICD-10-CM | POA: Diagnosis not present

## 2018-08-16 DIAGNOSIS — M2560 Stiffness of unspecified joint, not elsewhere classified: Secondary | ICD-10-CM | POA: Diagnosis not present

## 2018-08-16 DIAGNOSIS — Z853 Personal history of malignant neoplasm of breast: Secondary | ICD-10-CM | POA: Diagnosis not present

## 2018-08-16 NOTE — Telephone Encounter (Signed)
Received Physician Orders from [place]; forwarded to provider/SLS 02/19

## 2018-08-17 DIAGNOSIS — L820 Inflamed seborrheic keratosis: Secondary | ICD-10-CM | POA: Diagnosis not present

## 2018-08-17 DIAGNOSIS — L570 Actinic keratosis: Secondary | ICD-10-CM | POA: Diagnosis not present

## 2018-08-17 DIAGNOSIS — L298 Other pruritus: Secondary | ICD-10-CM | POA: Diagnosis not present

## 2018-08-18 DIAGNOSIS — M2560 Stiffness of unspecified joint, not elsewhere classified: Secondary | ICD-10-CM | POA: Diagnosis not present

## 2018-08-18 DIAGNOSIS — R531 Weakness: Secondary | ICD-10-CM | POA: Diagnosis not present

## 2018-08-18 DIAGNOSIS — R42 Dizziness and giddiness: Secondary | ICD-10-CM | POA: Diagnosis not present

## 2018-08-18 DIAGNOSIS — R262 Difficulty in walking, not elsewhere classified: Secondary | ICD-10-CM | POA: Diagnosis not present

## 2018-08-24 DIAGNOSIS — R42 Dizziness and giddiness: Secondary | ICD-10-CM | POA: Diagnosis not present

## 2018-08-24 DIAGNOSIS — M2560 Stiffness of unspecified joint, not elsewhere classified: Secondary | ICD-10-CM | POA: Diagnosis not present

## 2018-08-24 DIAGNOSIS — R531 Weakness: Secondary | ICD-10-CM | POA: Diagnosis not present

## 2018-08-24 DIAGNOSIS — R262 Difficulty in walking, not elsewhere classified: Secondary | ICD-10-CM | POA: Diagnosis not present

## 2018-08-26 ENCOUNTER — Other Ambulatory Visit: Payer: Self-pay | Admitting: Family Medicine

## 2018-08-27 ENCOUNTER — Encounter: Payer: Self-pay | Admitting: Family Medicine

## 2018-08-28 ENCOUNTER — Telehealth: Payer: Self-pay | Admitting: *Deleted

## 2018-08-28 NOTE — Telephone Encounter (Signed)
Received Physician Orders from [place]; forwarded to provider/SLS 03/02

## 2018-09-14 DIAGNOSIS — M1812 Unilateral primary osteoarthritis of first carpometacarpal joint, left hand: Secondary | ICD-10-CM | POA: Diagnosis not present

## 2018-09-14 DIAGNOSIS — M1811 Unilateral primary osteoarthritis of first carpometacarpal joint, right hand: Secondary | ICD-10-CM | POA: Diagnosis not present

## 2018-10-09 ENCOUNTER — Telehealth: Payer: Self-pay | Admitting: Oncology

## 2018-10-09 ENCOUNTER — Encounter: Payer: Self-pay | Admitting: Family Medicine

## 2018-10-09 NOTE — Telephone Encounter (Signed)
Rescheduled 4/15 to 6/22 per sch msg. Called and spoke with patient. Confirmed date and time

## 2018-10-10 NOTE — Telephone Encounter (Signed)
Patient scheduled for virtual visit 10/11/18

## 2018-10-11 ENCOUNTER — Other Ambulatory Visit: Payer: Self-pay

## 2018-10-11 ENCOUNTER — Ambulatory Visit (INDEPENDENT_AMBULATORY_CARE_PROVIDER_SITE_OTHER): Payer: Medicare HMO | Admitting: Family Medicine

## 2018-10-11 ENCOUNTER — Ambulatory Visit: Payer: Self-pay | Admitting: Oncology

## 2018-10-11 DIAGNOSIS — L719 Rosacea, unspecified: Secondary | ICD-10-CM | POA: Diagnosis not present

## 2018-10-11 DIAGNOSIS — R69 Illness, unspecified: Secondary | ICD-10-CM | POA: Diagnosis not present

## 2018-10-11 DIAGNOSIS — F418 Other specified anxiety disorders: Secondary | ICD-10-CM

## 2018-10-11 MED ORDER — MINOCYCLINE HCL 100 MG PO CAPS: 100.0000 mg | ORAL_CAPSULE | Freq: Two times a day (BID) | ORAL | 1 refills | Status: DC

## 2018-10-11 NOTE — Assessment & Plan Note (Signed)
She is socially distancing well and managing her anxiety well at the current time. She will notify us if worsens.
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
## 2018-10-11 NOTE — Assessment & Plan Note (Signed)
Has had a recent bad flare and is worst on the left side of her face, it is swollen, itchy red and irritated. Has had flares in the past but not this bad. Started on Minocycline 100 mg po bid x 14 days and reassess.

## 2018-10-15 IMAGING — US US THYROID
2 series · 13 of 25 positions shown · non-contrast
Comparison: Chest CT 11/02/2017 and facial CT 12/30/2016

CLINICAL DATA: Hypothyroidism.

EXAM:
THYROID ULTRASOUND
TECHNIQUE: Ultrasound examination of the thyroid gland and adjacent soft
tissues was performed.

[Series 1: us thyroid · 0.05mm/px · 12 of 49 slices shown (1 of 2)]
[im 1/49]
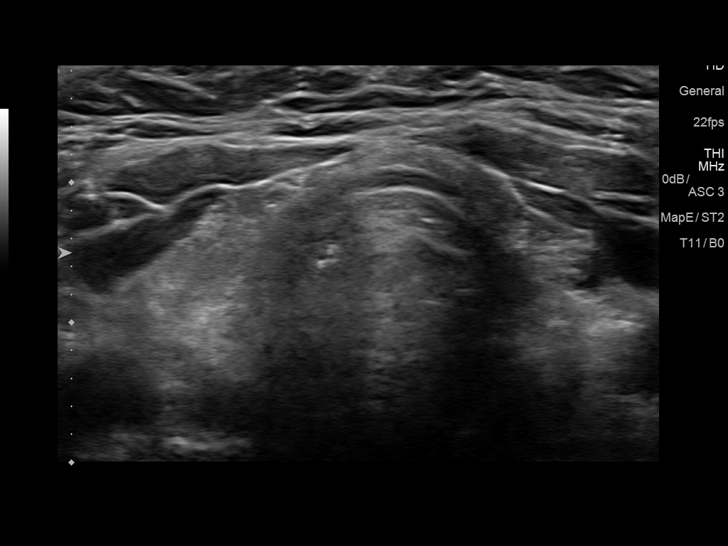
[im 5/49]
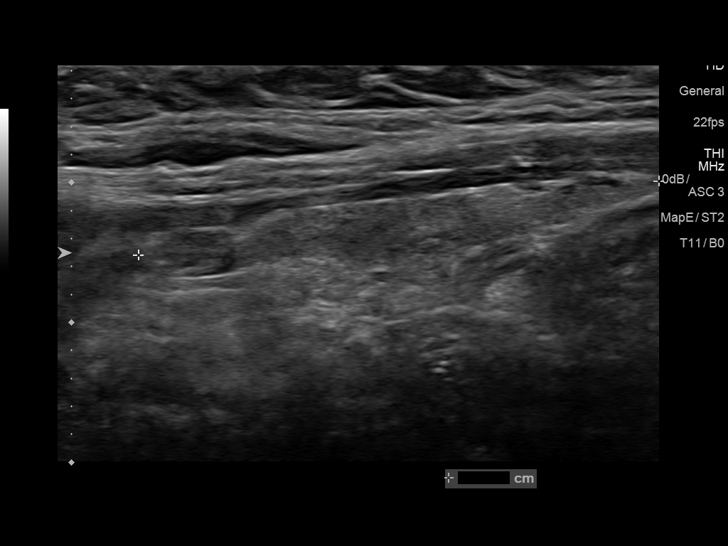
[im 9/49]
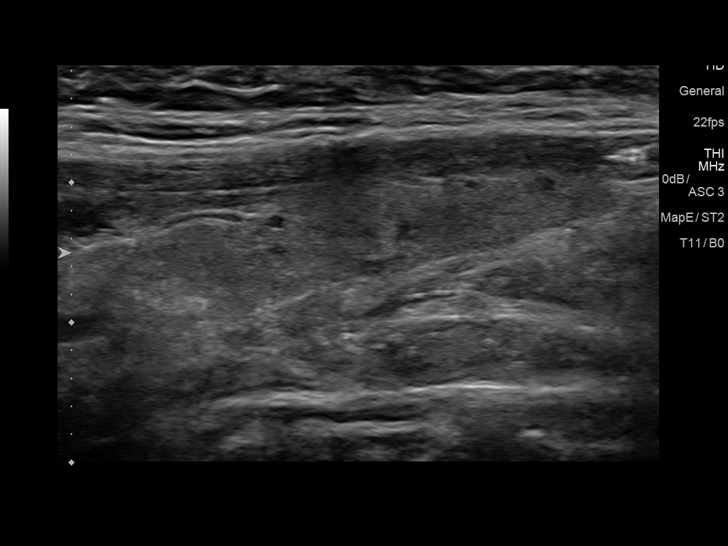
[im 14/49]
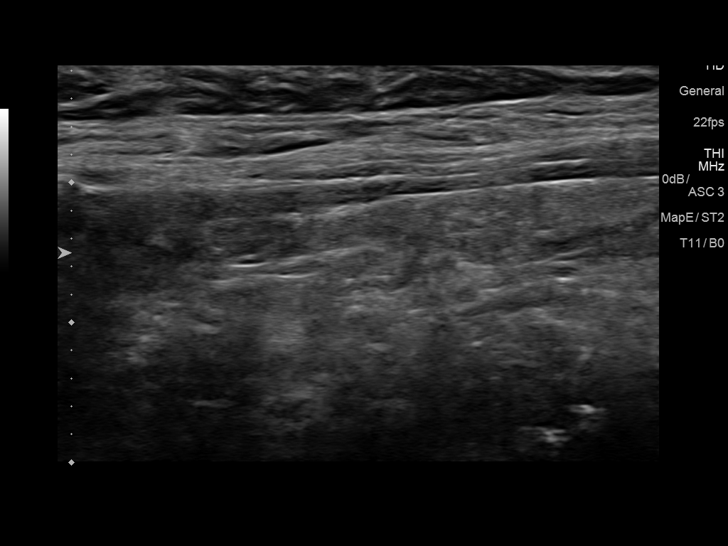
[im 18/49]
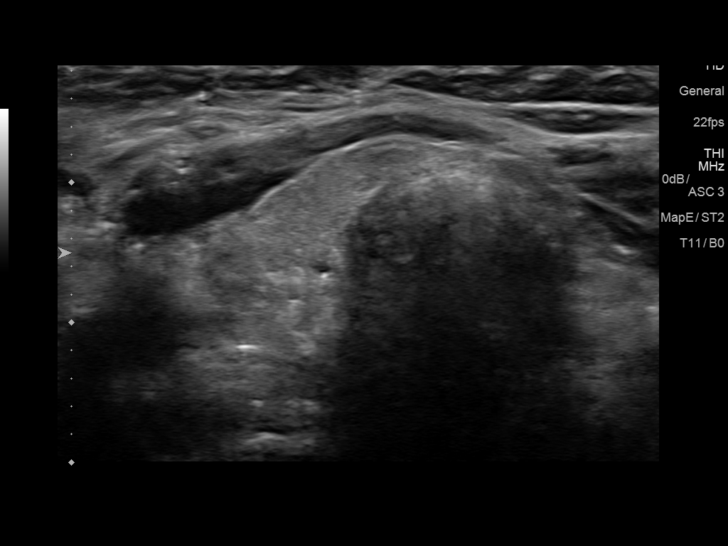
[im 22/49]
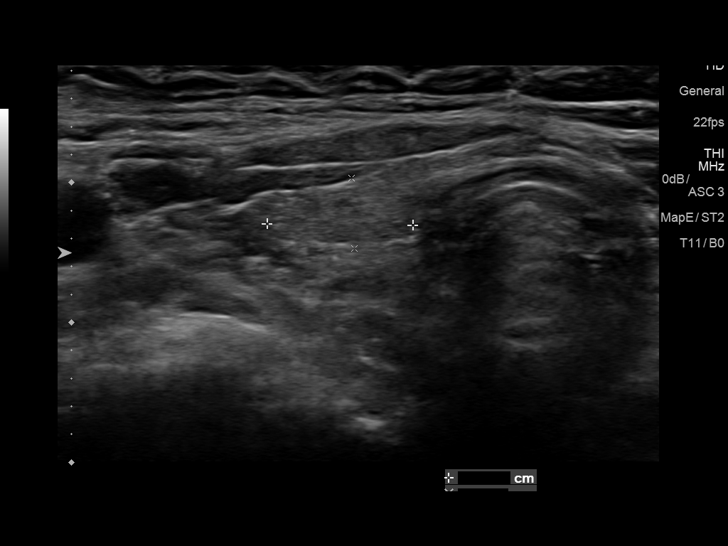
[im 27/49]
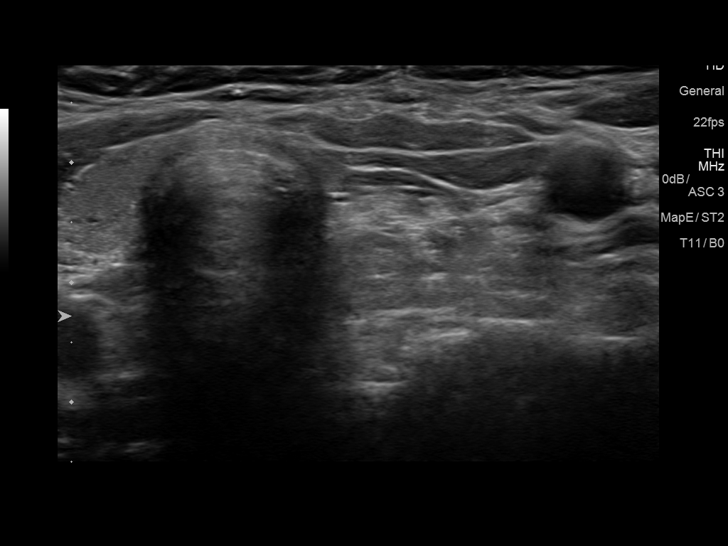
[im 31/49]
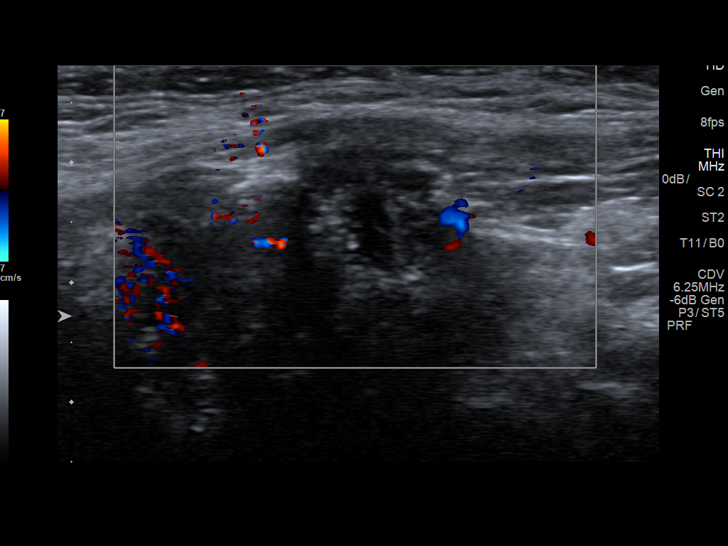
[im 35/49]
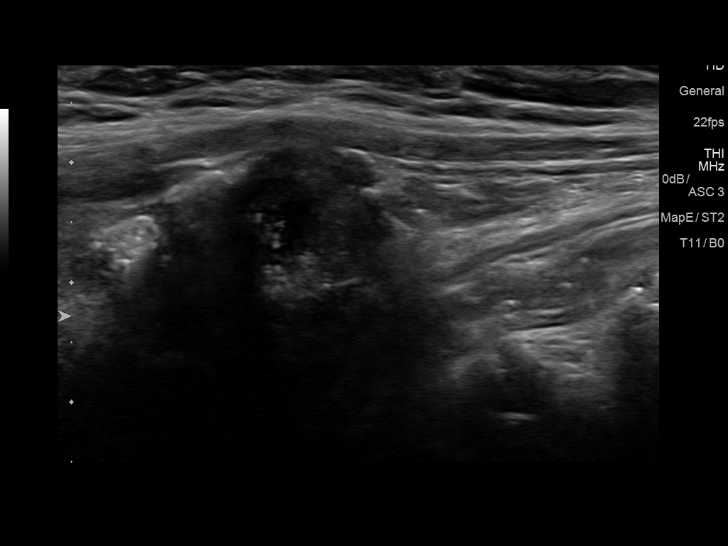
[im 40/49]
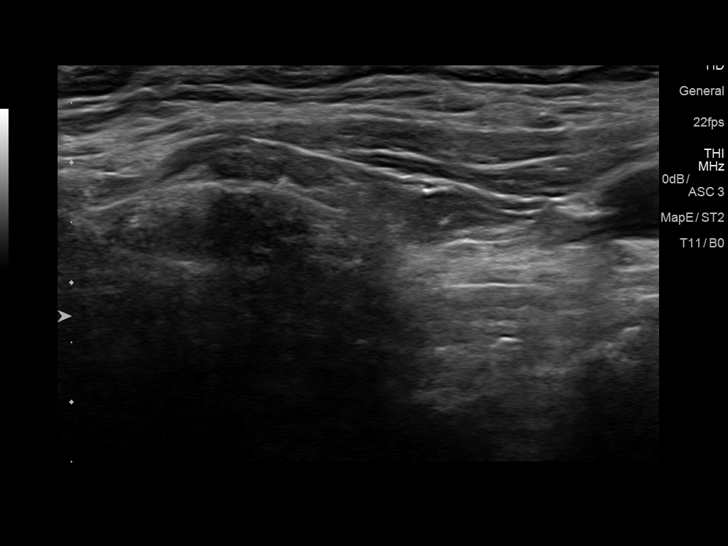
[im 44/49]
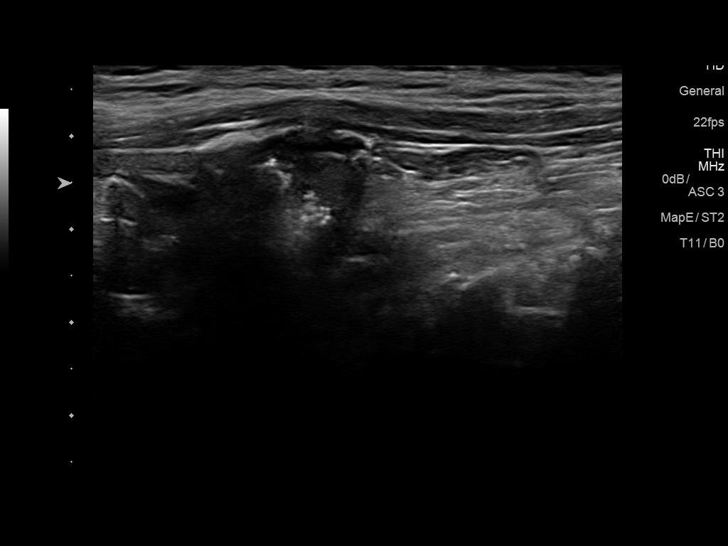
[im 49/49]
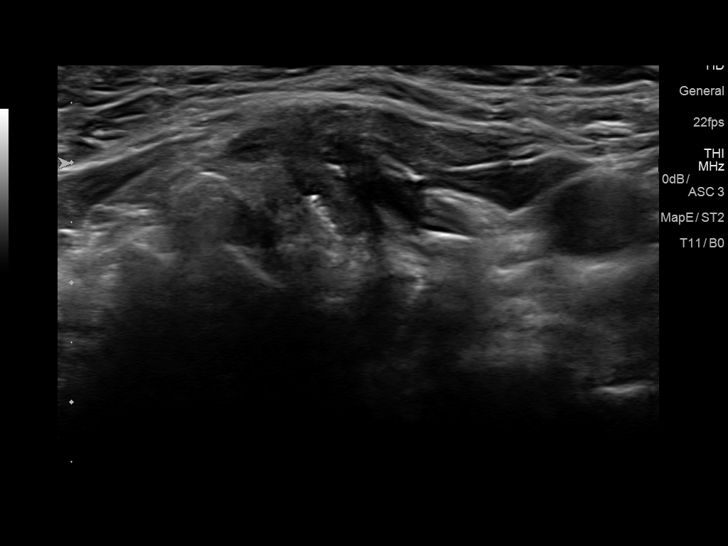

[Series 2: us thyroid · 0.08mm/px · 1 of 4 slices shown (2 of 2)]
[im 4/4]
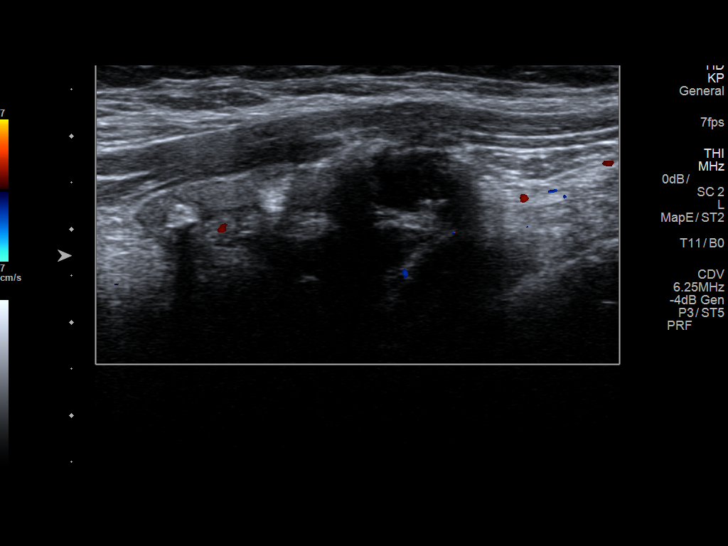

[13 of 25 positions shown; findings below may reference images not displayed]

FINDINGS: Parenchymal Echotexture: Mildly heterogenous

Isthmus: 0.2 cm

Right lobe: 3.8 x 0.5 x 1.0 cm

Left lobe: Absent

_________________________________________________________

Estimated total number of nodules >/= 1 cm: 0

Number of spongiform nodules >/=  2 cm not described below (TR1): 0

Number of mixed cystic and solid nodules >/= 1.5 cm not described
below (TR2): 0

_________________________________________________________

Overall, there is only a small amount of thyroid tissue present.
There is no significant left thyroid lobe. These findings are
similar to the previous CT images. Oval-shaped hypoechoic structure
along the superior right thyroid lobe measures 0.7 x 0.3 x 0.5 cm.
This thyroid nodule does not meet criteria for biopsy or dedicated
follow-up.

There may be a few small lymph nodes along the left side of the neck
in the expected region of the thyroid.
IMPRESSION: Small amount of thyroid tissue with an absent or hypoplastic left
thyroid lobe.

0.7 cm nodule in the right thyroid lobe does not meet criteria for
biopsy or dedicated follow-up.

The above is in keeping with the ACR TI-RADS recommendations - [HOSPITAL] 5612;[DATE].

## 2018-11-03 ENCOUNTER — Telehealth: Payer: Self-pay | Admitting: Cardiology

## 2018-11-03 NOTE — Telephone Encounter (Signed)
LMTCB to schedule appt from recall list with Dr Hochrein °

## 2018-11-07 ENCOUNTER — Other Ambulatory Visit: Payer: Self-pay

## 2018-11-07 ENCOUNTER — Ambulatory Visit (INDEPENDENT_AMBULATORY_CARE_PROVIDER_SITE_OTHER): Payer: Medicare HMO | Admitting: Family Medicine

## 2018-11-07 DIAGNOSIS — J4530 Mild persistent asthma, uncomplicated: Secondary | ICD-10-CM | POA: Diagnosis not present

## 2018-11-07 DIAGNOSIS — E039 Hypothyroidism, unspecified: Secondary | ICD-10-CM | POA: Diagnosis not present

## 2018-11-07 DIAGNOSIS — E559 Vitamin D deficiency, unspecified: Secondary | ICD-10-CM | POA: Diagnosis not present

## 2018-11-07 DIAGNOSIS — M25551 Pain in right hip: Secondary | ICD-10-CM | POA: Diagnosis not present

## 2018-11-07 DIAGNOSIS — F418 Other specified anxiety disorders: Secondary | ICD-10-CM | POA: Diagnosis not present

## 2018-11-07 DIAGNOSIS — T7840XS Allergy, unspecified, sequela: Secondary | ICD-10-CM | POA: Diagnosis not present

## 2018-11-07 DIAGNOSIS — R69 Illness, unspecified: Secondary | ICD-10-CM | POA: Diagnosis not present

## 2018-11-07 NOTE — Assessment & Plan Note (Signed)
Reports she rolled over in bed badly last week and twisted her right leg now the leg from the hip to the knee and below has been hurting. She follows with Dr Theda Sers at [facility] and has seen Dr Nelva Bush in the past. She agrees to follow up with Dr Theda Sers regarding her options. May consider a muscle relaxer qhs to help her sleep. She will let us know if she wants any further assistance from [place].

## 2018-11-07 NOTE — Assessment & Plan Note (Signed)
Despite the pandemic is managing well. No recent exacerbation
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
## 2018-11-07 NOTE — Assessment & Plan Note (Signed)
On Levothyroxine, continue to monitor 

## 2018-11-07 NOTE — Assessment & Plan Note (Signed)
Some sneezing but otherwise controlled. No change to meds at present.

## 2018-11-07 NOTE — Assessment & Plan Note (Signed)
Supplement and monitor 

## 2018-11-07 NOTE — Assessment & Plan Note (Signed)
No recent exacerbation 
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
## 2018-11-13 ENCOUNTER — Encounter: Payer: Self-pay | Admitting: Cardiology

## 2018-11-13 ENCOUNTER — Telehealth (INDEPENDENT_AMBULATORY_CARE_PROVIDER_SITE_OTHER): Payer: Medicare HMO | Admitting: Cardiology

## 2018-11-13 VITALS — BP 127/77 | HR 78 | Ht 65.0 in | Wt 165.0 lb

## 2018-11-13 DIAGNOSIS — I2510 Atherosclerotic heart disease of native coronary artery without angina pectoris: Secondary | ICD-10-CM | POA: Diagnosis not present

## 2018-11-13 DIAGNOSIS — Q249 Congenital malformation of heart, unspecified: Secondary | ICD-10-CM

## 2018-11-13 DIAGNOSIS — E785 Hyperlipidemia, unspecified: Secondary | ICD-10-CM | POA: Diagnosis not present

## 2018-11-13 DIAGNOSIS — Q261 Persistent left superior vena cava: Secondary | ICD-10-CM

## 2018-11-13 DIAGNOSIS — Z7189 Other specified counseling: Secondary | ICD-10-CM

## 2018-11-13 NOTE — Patient Instructions (Signed)
Medication Instructions:  Continue current medications  If you need a refill on your cardiac medications before your next appointment, please call your pharmacy.  Labwork: None Ordered   Testing/Procedures: Your physician has requested that you have an echocardiogram in 1 Year. Echocardiography is a painless test that uses sound waves to create images of your heart. It provides your doctor with information about the size and shape of your heart and how well your heart's chambers and valves are working. This procedure takes approximately one hour. There are no restrictions for this procedure.  Follow-Up: You will need a follow up appointment in 1 Year.  Please call our office 2 months in advance to schedule this appointment.  You may see Minus Breeding, MD or one of the following Advanced Practice Providers on your designated Care Team:   Rosaria Ferries, PA-C Jory Sims, DNP, ANP  At [facility], you and your health needs are our priority.  As part of our continuing mission to provide you with exceptional heart care, we have created designated Provider Care Teams.  These Care Teams include your primary Cardiologist (physician) and Advanced Practice Providers (APPs -  Physician Assistants and Nurse Practitioners) who all work together to provide you with the care you need, when you need it.  Thank you for choosing [facility] at [facility]!!

## 2018-11-22 DIAGNOSIS — M1711 Unilateral primary osteoarthritis, right knee: Secondary | ICD-10-CM | POA: Diagnosis not present

## 2018-11-24 ENCOUNTER — Other Ambulatory Visit: Payer: Self-pay | Admitting: Family Medicine

## 2018-12-01 ENCOUNTER — Telehealth: Payer: Self-pay | Admitting: Family Medicine

## 2018-12-01 MED ORDER — BUPROPION HCL ER (XL) 300 MG PO TB24: 300.0000 mg | ORAL_TABLET | Freq: Every day | ORAL | 1 refills | Status: DC

## 2018-12-01 NOTE — Addendum Note (Signed)
Addended by: [name] A on: 12/01/2018 02:17 PM   Modules accepted: Orders

## 2018-12-01 NOTE — Telephone Encounter (Signed)
Medication sent in. 

## 2018-12-01 NOTE — Telephone Encounter (Signed)
Pt called regarding about her medication refill, pt stated only has 2 pills left and is needing refill soon. Pt was informed by pec the rx was declined and pt would like to know why, pt was seen by provider as VOV 3 wks ago. Please advise pt at tel [phone].

## 2018-12-14 ENCOUNTER — Ambulatory Visit: Payer: Medicare HMO | Admitting: Family Medicine

## 2018-12-15 ENCOUNTER — Telehealth: Payer: Self-pay | Admitting: Oncology

## 2018-12-15 ENCOUNTER — Other Ambulatory Visit: Payer: Self-pay

## 2018-12-15 DIAGNOSIS — Z170 Estrogen receptor positive status [ER+]: Secondary | ICD-10-CM

## 2018-12-15 DIAGNOSIS — C50811 Malignant neoplasm of overlapping sites of right female breast: Secondary | ICD-10-CM

## 2018-12-15 NOTE — Telephone Encounter (Signed)
Returned patient's phone call regarding cancelling an appointment, left a voicemail. °

## 2018-12-16 ENCOUNTER — Telehealth: Payer: Self-pay | Admitting: Oncology

## 2018-12-16 NOTE — Telephone Encounter (Signed)
Per message from patient cancel 6/22 appointments. Per message patient will call back to reschedule.

## 2018-12-18 ENCOUNTER — Inpatient Hospital Stay: Payer: Medicare HMO | Admitting: Oncology

## 2018-12-18 ENCOUNTER — Inpatient Hospital Stay: Payer: Medicare HMO

## 2019-01-11 DIAGNOSIS — R69 Illness, unspecified: Secondary | ICD-10-CM | POA: Diagnosis not present

## 2019-01-14 ENCOUNTER — Other Ambulatory Visit: Payer: Self-pay | Admitting: Family Medicine

## 2019-02-04 ENCOUNTER — Other Ambulatory Visit: Payer: Self-pay | Admitting: Family Medicine

## 2019-02-08 ENCOUNTER — Other Ambulatory Visit: Payer: Self-pay

## 2019-02-08 ENCOUNTER — Ambulatory Visit (INDEPENDENT_AMBULATORY_CARE_PROVIDER_SITE_OTHER): Payer: Medicare HMO | Admitting: Family Medicine

## 2019-02-08 VITALS — BP 117/79 | HR 91 | Wt 169.0 lb

## 2019-02-08 DIAGNOSIS — E039 Hypothyroidism, unspecified: Secondary | ICD-10-CM | POA: Diagnosis not present

## 2019-02-08 DIAGNOSIS — G4700 Insomnia, unspecified: Secondary | ICD-10-CM | POA: Diagnosis not present

## 2019-02-08 DIAGNOSIS — E559 Vitamin D deficiency, unspecified: Secondary | ICD-10-CM | POA: Diagnosis not present

## 2019-02-08 DIAGNOSIS — R000 Tachycardia, unspecified: Secondary | ICD-10-CM

## 2019-02-08 DIAGNOSIS — E785 Hyperlipidemia, unspecified: Secondary | ICD-10-CM

## 2019-02-08 DIAGNOSIS — R739 Hyperglycemia, unspecified: Secondary | ICD-10-CM

## 2019-02-08 MED ORDER — FUROSEMIDE 40 MG PO TABS: ORAL_TABLET | ORAL | 2 refills | Status: DC
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
## 2019-02-08 NOTE — Assessment & Plan Note (Signed)
Encouraged heart healthy diet, increase exercise, avoid trans fats, consider a krill oil cap daily 

## 2019-02-08 NOTE — Assessment & Plan Note (Signed)
On Levothyroxine, continue to monitor 

## 2019-02-08 NOTE — Assessment & Plan Note (Signed)
hgba1c acceptable, minimize simple carbs. Increase exercise as tolerated.  

## 2019-02-08 NOTE — Assessment & Plan Note (Signed)
Encouraged good sleep hygiene such as dark, quiet room. No blue/green glowing lights such as computer screens in bedroom. No alcohol or stimulants in evening. Cut down on caffeine as able. Regular exercise is helpful but not just prior to bed time.  

## 2019-02-08 NOTE — Assessment & Plan Note (Signed)
Supplement and monitor 

## 2019-02-15 DIAGNOSIS — R69 Illness, unspecified: Secondary | ICD-10-CM | POA: Diagnosis not present

## 2019-02-22 DIAGNOSIS — M1811 Unilateral primary osteoarthritis of first carpometacarpal joint, right hand: Secondary | ICD-10-CM | POA: Diagnosis not present

## 2019-03-08 DIAGNOSIS — M1711 Unilateral primary osteoarthritis, right knee: Secondary | ICD-10-CM | POA: Diagnosis not present

## 2019-03-13 ENCOUNTER — Ambulatory Visit: Payer: Self-pay | Admitting: *Deleted

## 2019-03-13 ENCOUNTER — Encounter: Payer: Self-pay | Admitting: Family Medicine

## 2019-03-13 ENCOUNTER — Other Ambulatory Visit: Payer: Self-pay

## 2019-03-13 ENCOUNTER — Ambulatory Visit (INDEPENDENT_AMBULATORY_CARE_PROVIDER_SITE_OTHER): Payer: Medicare HMO | Admitting: Medical

## 2019-03-13 DIAGNOSIS — L089 Local infection of the skin and subcutaneous tissue, unspecified: Secondary | ICD-10-CM | POA: Diagnosis not present

## 2019-03-13 DIAGNOSIS — T7840XA Allergy, unspecified, initial encounter: Secondary | ICD-10-CM | POA: Diagnosis not present

## 2019-03-13 MED ORDER — HYDROXYZINE HCL 10 MG PO TABS: ORAL_TABLET | ORAL | 0 refills | Status: DC

## 2019-03-13 MED ORDER — SULFAMETHOXAZOLE-TRIMETHOPRIM 800-160 MG PO TABS: 1.0000 | ORAL_TABLET | Freq: Two times a day (BID) | ORAL | 0 refills | Status: DC

## 2019-03-13 MED ORDER — PREDNISONE 10 MG PO TABS: ORAL_TABLET | ORAL | 0 refills | Status: DC

## 2019-03-13 NOTE — Telephone Encounter (Signed)
I returned [name]'s call.   She is concerned about a bug bite she got last night about 5:00 PM on the inside of her right thigh.   I don't know what bit me.   It started itching immediately and has itched last night waking me up.   I'm using Calamine lotion which helps temporarily.  The bite now is very red in the middle and looks like there is a bruise.   It looks like a bull's eye with a red streak coming from it.   It's at least the size of a silver dollar.  It's only mildly painful. I'm also concerned because I've had a bone marrow transplant for my cancer.  I called Dr. Frederik Pear office and spoke with [place].   I transferred the call to [facility] for scheduling.  I sent my notes to the office.   Reason for Disposition . [1] Red streak or red line AND [2] length > 2 inches (5 cm)  Answer Assessment - Initial Assessment Questions 1. TYPE of INSECT: "What type of insect was it?"      I don't know.   5"00 yes3. LOCATION: "Where is the insect bite located?"      *No Answer* 4. REDNESS: "Is the area red or pink?" If so, ask "What size is area of redness?" (inches or cm). "When did the redness start?"     White with a hard knot in the center.   It looks like a bull's eye.   There is a red streck on it.    On right leg on inside of my thigh.   5. PAIN: "Is there any pain?" If so, ask: "How bad is it?"  (Scale 1-10; or mild, moderate, severe)     A little pain 6. ITCHING: "Does it itch?" If so, ask: "How bad is the itch?"    - MILD: doesn't interfere with normal activities   - MODERATE-SEVERE: interferes with work, school, sleep, or other activities      Severe itching.   It woke me up during the night.   Calamine lotion helped some. 7. SWELLING: "How big is the swelling?" (inches, cm, or compare to coins)     It doesn't look like it.   Bigger than a silver dollar. 8. OTHER SYMPTOMS: "Do you have any other symptoms?"  (e.g., difficulty breathing, hives)     No 9. PREGNANCY: "Is there any chance  you are pregnant?" "When was your last menstrual period?"     Not asked  Protocols used: INSECT BITE-A-AH

## 2019-03-13 NOTE — Telephone Encounter (Signed)
Appt scheduled

## 2019-03-13 NOTE — Progress Notes (Signed)
   Subjective:    Patient ID: [name], female    DOB: 1956-05-18, 63 y.o.   MRN: [id number]  HPI  Virtual Visit via Telephone Note  I connected with [name] on 03/13/19 at  3:00 PM EDT by telephone and verified that I am speaking with the correct person using two identifiers.  Location: Patient: home Provider: office   I discussed the limitations, risks, security and privacy concerns of performing an evaluation and management service by telephone and the availability of in person appointments. I also discussed with the patient that there may be a patient responsible charge related to this service. The patient expressed understanding and agreed to proceed.   History of Present Illness: Pt was playing ball on deck with her dogs. Felt pinch on her medial thigh and then started to feel itch. Center has hard not. Area is painful. She has some surrounding discoloration around mid center.  Pt states center started white but now more angry reg. She feel like red streak toward her knee. This is present on rt thigh. Pt states edges have slight bruised appearance.   No fever, no chills or sweats.    Observations/Objective:  General- no acute distress, pleasant, oriented. Normal speech. Rt leg- see my chart photo in epic.    Assessment and Plan: By history concern for insect bite and by description and video exam concern for quick secondary infection.  Rx taper prednisone, bactrim ds and hydroxyzine. Rx advisement given. Start medication today.  Appointment tomorrow to evaluate in person. Will see if incrimental improvement of signs/symptoms.  If area worsens or changes after hours then ED evaluation.  Follow up tomorrow.    Mackie Pai, PA-C  Follow Up Instructions:    I discussed the assessment and treatment plan with the patient. The patient was provided an opportunity to ask questions and all were answered. The patient agreed with the plan and  demonstrated an understanding of the instructions.   The patient was advised to call back or seek an in-person evaluation if the symptoms worsen or if the condition fails to improve as anticipated.  I provided 25  minutes of non-face-to-face time during this encounter.   Mackie Pai, PA-C   Review of Systems  Constitutional: Negative for chills, fatigue and fever.  Respiratory: Negative for cough, chest tightness, shortness of breath and wheezing.   Cardiovascular: Negative for chest pain and palpitations.  Gastrointestinal: Negative for abdominal pain.  Skin:       See hpi. See picture attached to my chart.       Objective:   Physical Exam        Assessment & Plan:

## 2019-03-13 NOTE — Telephone Encounter (Signed)
The pt sent pictures of the bite via MyChart.

## 2019-03-14 ENCOUNTER — Encounter: Payer: Self-pay | Admitting: Medical

## 2019-03-14 ENCOUNTER — Other Ambulatory Visit: Payer: Self-pay

## 2019-03-14 ENCOUNTER — Ambulatory Visit (INDEPENDENT_AMBULATORY_CARE_PROVIDER_SITE_OTHER): Payer: Medicare HMO | Admitting: Medical

## 2019-03-14 VITALS — BP 140/86 | HR 111 | Temp 96.8°F | Resp 16 | Ht 65.0 in | Wt 174.4 lb

## 2019-03-14 DIAGNOSIS — T7840XA Allergy, unspecified, initial encounter: Secondary | ICD-10-CM

## 2019-03-14 DIAGNOSIS — L089 Local infection of the skin and subcutaneous tissue, unspecified: Secondary | ICD-10-CM | POA: Diagnosis not present

## 2019-03-14 NOTE — Patient Instructions (Addendum)
By history concern for insect bite and by description and video exam concern for quick secondary infection.  Rx taper prednisone, bactrim ds and hydroxyzine. Rx advisement given. Start medication today.  Appointment tomorrow to evaluate in person. Will see if incrimental improvement of signs/symptoms.  If area worsens or changes after hours then ED evaluation.  Follow up tomorrow.

## 2019-03-14 NOTE — Progress Notes (Signed)
   Subjective:    Patient ID: [name], female    DOB: 03-09-1956, 63 y.o.   MRN: [id number]  HPI   See yeserday exam. Concern for allergic reaction with secondary infection. She started bactrim, prednisone and hydroxyzine. States some improvement particularly states red streak resolved. Started treatment yesterday.  Below is hpi from yesterday.  "Pt was playing ball on deck with her dogs. Felt pinch on her medial thigh and then started to feel itch. Center has hard not. Area is painful. She has some surrounding discoloration around mid center.  Pt states center started white but now more angry reg. She feel like red streak toward her knee. This is present on rt thigh. Pt states edges have slight bruised appearance.   No fever, no chills or sweats."   Review of Systems  Constitutional: Negative for chills, fatigue and fever.  Respiratory: Negative for cough, chest tightness, shortness of breath and wheezing.   Cardiovascular: Negative for chest pain and palpitations.  Gastrointestinal: Negative for abdominal pain.  Musculoskeletal: Negative for back pain and joint swelling.  Skin:       Skin see hpi.  Neurological: Negative for dizziness, seizures, speech difficulty, weakness and headaches.  Hematological: Negative for adenopathy. Does not bruise/bleed easily.  Psychiatric/Behavioral: Negative for agitation and behavioral problems.       Objective:   Physical Exam  General- No acute distress. Pleasant patient. Neck- Full range of motion, no jvd Lungs- Clear, even and unlabored. Heart- regular rate and rhythm. Neurologic- CNII- XII grossly intact.   Rt lower ext- medial aspect 3.5 cm bruised area. With small center red area and faint indurated. No red streak.      Assessment & Plan:  Your area of prior insect resulting in probable allergic reaction with secondary infection appears to be incrementally improved after less than one day of treatment. Continue  bacrim ds, prednisone and hydroxyzine.  Counseled spider bite in differential diagnosis. If your symptoms change or worsen notify us. Severe changes as discussed then ED evaluation.  Follow up 7 days or as needed  [employer]

## 2019-03-14 NOTE — Patient Instructions (Addendum)
Your area of prior insect resulting in probable allergic reaction with secondary infection appears to be incrementally improved after less than one day of treatment. Continue bacrim ds, prednisone and hydroxyzine.  Counseled spider bite in differential diagnosis. If your symptoms change or worsen notify us. Severe changes as discussed then ED evaluation.  Follow up 7 days or as needed

## 2019-03-15 DIAGNOSIS — M1711 Unilateral primary osteoarthritis, right knee: Secondary | ICD-10-CM | POA: Diagnosis not present

## 2019-03-20 ENCOUNTER — Other Ambulatory Visit: Payer: Self-pay

## 2019-03-21 ENCOUNTER — Encounter: Payer: Self-pay | Admitting: Medical

## 2019-03-21 ENCOUNTER — Ambulatory Visit (INDEPENDENT_AMBULATORY_CARE_PROVIDER_SITE_OTHER): Payer: Medicare HMO | Admitting: Medical

## 2019-03-21 ENCOUNTER — Other Ambulatory Visit: Payer: Self-pay

## 2019-03-21 VITALS — BP 121/68 | HR 108 | Temp 96.1°F | Resp 16 | Ht 65.0 in | Wt 172.4 lb

## 2019-03-21 DIAGNOSIS — L089 Local infection of the skin and subcutaneous tissue, unspecified: Secondary | ICD-10-CM
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
## 2019-03-21 NOTE — Patient Instructions (Addendum)
Your prior area of skin infection is resolved with tx of bactrim. I do think that it was spider bite initially that got infected. Appears had transient mild side effect form antibiotic. But now better/none.  Pt wants to wait on flu vaccine october  Follow as regularly scheduled with pcp.

## 2019-04-02 ENCOUNTER — Other Ambulatory Visit: Payer: Self-pay

## 2019-04-02 ENCOUNTER — Encounter (HOSPITAL_COMMUNITY): Payer: Self-pay

## 2019-04-02 ENCOUNTER — Emergency Department (HOSPITAL_COMMUNITY)
Admission: EM | Admit: 2019-04-02 | Discharge: 2019-04-02 | Disposition: A | Discharge status: Home / Self Care | Payer: Medicare HMO | Attending: Emergency Medicine | Admitting: Emergency Medicine

## 2019-04-02 ENCOUNTER — Emergency Department (HOSPITAL_COMMUNITY): Payer: Medicare HMO

## 2019-04-02 ENCOUNTER — Ambulatory Visit: Payer: Self-pay

## 2019-04-02 DIAGNOSIS — Z9481 Bone marrow transplant status: Secondary | ICD-10-CM | POA: Insufficient documentation

## 2019-04-02 DIAGNOSIS — M545 Low back pain: Secondary | ICD-10-CM | POA: Insufficient documentation

## 2019-04-02 DIAGNOSIS — Z96641 Presence of right artificial hip joint: Secondary | ICD-10-CM | POA: Insufficient documentation

## 2019-04-02 DIAGNOSIS — Z79899 Other long term (current) drug therapy: Secondary | ICD-10-CM | POA: Diagnosis not present

## 2019-04-02 DIAGNOSIS — E039 Hypothyroidism, unspecified: Secondary | ICD-10-CM | POA: Diagnosis not present

## 2019-04-02 DIAGNOSIS — M549 Dorsalgia, unspecified: Secondary | ICD-10-CM | POA: Diagnosis not present

## 2019-04-02 DIAGNOSIS — Z853 Personal history of malignant neoplasm of breast: Secondary | ICD-10-CM | POA: Insufficient documentation

## 2019-04-02 DIAGNOSIS — R1030 Lower abdominal pain, unspecified: Secondary | ICD-10-CM | POA: Insufficient documentation

## 2019-04-02 DIAGNOSIS — K6389 Other specified diseases of intestine: Secondary | ICD-10-CM | POA: Diagnosis not present

## 2019-04-02 DIAGNOSIS — I2510 Atherosclerotic heart disease of native coronary artery without angina pectoris: Secondary | ICD-10-CM | POA: Diagnosis not present

## 2019-04-02 DIAGNOSIS — K529 Noninfective gastroenteritis and colitis, unspecified: Secondary | ICD-10-CM | POA: Diagnosis not present

## 2019-04-02 DIAGNOSIS — K625 Hemorrhage of anus and rectum: Secondary | ICD-10-CM

## 2019-04-02 DIAGNOSIS — J45909 Unspecified asthma, uncomplicated: Secondary | ICD-10-CM | POA: Diagnosis not present

## 2019-04-02 LAB — URINALYSIS, ROUTINE W REFLEX MICROSCOPIC
Bilirubin Urine: NEGATIVE
Glucose, UA: NEGATIVE mg/dL
Ketones, ur: NEGATIVE mg/dL
Nitrite: NEGATIVE
Protein, ur: NEGATIVE mg/dL
RBC / HPF: >50 RBC/hpf — ABNORMAL HIGH (ref 0–5)
Specific Gravity, Urine: 1.029 (ref 1.005–1.030)
pH: 5 (ref 5.0–8.0)

## 2019-04-02 LAB — CBC WITH DIFFERENTIAL/PLATELET
Abs Immature Granulocytes: 0.04 10*3/uL (ref 0.00–0.07)
Basophils Absolute: 0.1 10*3/uL (ref 0.0–0.1)
Basophils Relative: 1 %
Eosinophils Absolute: 0.1 10*3/uL (ref 0.0–0.5)
Eosinophils Relative: 0 %
HCT: 44.8 % (ref 36.0–46.0)
Hemoglobin: 14.4 g/dL (ref 12.0–15.0)
Immature Granulocytes: 0 %
Lymphocytes Relative: 11 %
Lymphs Abs: 1.2 10*3/uL (ref 0.7–4.0)
MCH: 30.8 pg (ref 26.0–34.0)
MCHC: 32.1 g/dL (ref 30.0–36.0)
MCV: 95.7 fL (ref 80.0–100.0)
Monocytes Absolute: 1 10*3/uL (ref 0.1–1.0)
Monocytes Relative: 8 %
Neutro Abs: 9.3 10*3/uL — ABNORMAL HIGH (ref 1.7–7.7)
Neutrophils Relative %: 80 %
Platelets: 275 10*3/uL (ref 150–400)
RBC: 4.68 MIL/uL (ref 3.87–5.11)
RDW: 12.9 % (ref 11.5–15.5)
WBC: 11.6 10*3/uL — ABNORMAL HIGH (ref 4.0–10.5)
nRBC: 0 % (ref 0.0–0.2)

## 2019-04-02 LAB — COMPREHENSIVE METABOLIC PANEL
ALT: 23 U/L (ref 0–44)
AST: 29 U/L (ref 15–41)
Albumin: 4.1 g/dL (ref 3.5–5.0)
Alkaline Phosphatase: 102 U/L (ref 38–126)
Anion gap: 7 (ref 5–15)
BUN: 16 mg/dL (ref 8–23)
CO2: 26 mmol/L (ref 22–32)
Calcium: 8.6 mg/dL — ABNORMAL LOW (ref 8.9–10.3)
Chloride: 106 mmol/L (ref 98–111)
Creatinine, Ser: 0.87 mg/dL (ref 0.44–1.00)
GFR calc Af Amer: >60 mL/min (ref >60)
GFR calc non Af Amer: >60 mL/min (ref >60)
Glucose, Bld: 110 mg/dL — ABNORMAL HIGH (ref 70–99)
Potassium: 3.5 mmol/L (ref 3.5–5.1)
Sodium: 139 mmol/L (ref 135–145)
Total Bilirubin: 0.7 mg/dL (ref 0.3–1.2)
Total Protein: 6.8 g/dL (ref 6.5–8.1)

## 2019-04-02 LAB — LIPASE, BLOOD: Lipase: 26 U/L (ref 11–51)

## 2019-04-02 LAB — TYPE AND SCREEN
ABO/RH(D): A POS
Antibody Screen: NEGATIVE

## 2019-04-02 MED ORDER — AMOXICILLIN-POT CLAVULANATE 875-125 MG PO TABS: 1.0000 | ORAL_TABLET | Freq: Three times a day (TID) | ORAL | 0 refills | Status: DC

## 2019-04-02 MED ORDER — AMOXICILLIN-POT CLAVULANATE 875-125 MG PO TABS
1.0000 | ORAL_TABLET | Freq: Once | ORAL | Status: AC
  Administered 2019-04-02: 1 via ORAL
  Filled 2019-04-02: qty 1

## 2019-04-02 MED ORDER — SODIUM CHLORIDE (PF) 0.9 % IJ SOLN
INTRAMUSCULAR | Status: AC
  Filled 2019-04-02: qty 50

## 2019-04-02 MED ORDER — IOHEXOL 300 MG/ML  SOLN
100.0000 mL | Freq: Once | INTRAMUSCULAR | Status: AC | PRN
  Administered 2019-04-02: 100 mL via INTRAVENOUS

## 2019-04-02 MED ORDER — FENTANYL CITRATE (PF) 100 MCG/2ML IJ SOLN
50.0000 ug | Freq: Once | INTRAMUSCULAR | Status: AC
  Administered 2019-04-02: 50 ug via INTRAVENOUS
  Filled 2019-04-02: qty 2

## 2019-04-02 MED ORDER — DICYCLOMINE HCL 20 MG PO TABS: 20.0000 mg | ORAL_TABLET | Freq: Two times a day (BID) | ORAL | 0 refills | Status: DC

## 2019-04-02 MED ORDER — DICYCLOMINE HCL 10 MG/ML IM SOLN
20.0000 mg | Freq: Once | INTRAMUSCULAR | Status: AC
  Administered 2019-04-02: 20 mg via INTRAMUSCULAR
  Filled 2019-04-02: qty 2

## 2019-04-02 MED ORDER — ONDANSETRON HCL 4 MG/2ML IJ SOLN
4.0000 mg | Freq: Once | INTRAMUSCULAR | Status: AC
  Administered 2019-04-02: 4 mg via INTRAVENOUS
  Filled 2019-04-02: qty 2

## 2019-04-02 MED ORDER — SODIUM CHLORIDE 0.9 % IV SOLN
INTRAVENOUS | Status: DC
  Administered 2019-04-02: 13:00:00 via INTRAVENOUS
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
## 2019-04-02 NOTE — Telephone Encounter (Signed)
Incoming call from Patient who states that she has been having Rectum Bleeding since last night.  Rates it moderate  Has abdominal Pain.  Denies blood thinners. Reviewed Protocol with Patient.  Recommended Patient go to ED or Urgent care.  Patient voiced understanding.       Reason for Disposition . SEVERE rectal bleeding (large blood clots; on and off, or constant bleeding)  Answer Assessment - Initial Assessment Questions 1. APPEARANCE of BLOOD: "What color is it?" "Is it passed separately, on the surface of the stool, or mixed in with the stool?"     Mixed in.  2. AMOUNT: "How much blood was passed?"      moderatelyu 3. FREQUENCY: "How many times has blood been passed with the stools?"      Since last night  4. ONSET: "When was the blood first seen in the stools?" (Days or weeks)     Sunday night  5. DIARRHEA: "Is there also some diarrhea?" If so, ask: "How many diarrhea stools were passed in past 24 hours?"     Solid  6. CONSTIPATION: "Do you have constipation?" If so, "How bad is it?"     denies 7. RECURRENT SYMPTOMS: "Have you had blood in your stools before?" If so, ask: "When was the last time?" and "What happened that time?"      no 8. BLOOD THINNERS: "Do you take any blood thinners?" (e.g., Coumadin/warfarin, Pradaxa/dabigatran, aspirin)     denies9. OTHER SYMPTOMS: "Do you have any other symptoms?"  (e.g., abdominal pain, vomiting, dizziness, fever)    Repots abdominal Pain.   10. PREGNANCY: "Is there any chance you are pregnant?" "When was your last menstrual period?"       *No Answer*  Protocols used: RECTAL BLEEDING-A-AH

## 2019-04-02 NOTE — ED Triage Notes (Signed)
Pt states that since 2100 last night, she has had blood in stool . Pt describes it as bright red with clots. Pt states it is a moderate to large amount. Pt also describes back pain.

## 2019-04-02 NOTE — Discharge Instructions (Signed)
Please be sure to follow-up with her gastroenterology team, or return here for concerning changes in your condition.

## 2019-04-03 ENCOUNTER — Other Ambulatory Visit: Payer: Self-pay | Admitting: Family Medicine

## 2019-04-03 ENCOUNTER — Telehealth: Payer: Self-pay | Admitting: Family Medicine

## 2019-04-03 DIAGNOSIS — K625 Hemorrhage of anus and rectum: Secondary | ICD-10-CM

## 2019-04-03 NOTE — Telephone Encounter (Signed)
Copied from [place] [phone]. Topic: General - Other >> Apr 03, 2019  3:31 PM Keene Breath wrote: Reason for CRM: Patient is calling to get a emergency referral to another GI doctor.  Patient stated that she needs a GI doctor as soon as possible.  Please advise and call patient back to discuss.  CB# [phone]

## 2019-04-04 DIAGNOSIS — K529 Noninfective gastroenteritis and colitis, unspecified: Secondary | ICD-10-CM | POA: Diagnosis not present

## 2019-04-04 DIAGNOSIS — K625 Hemorrhage of anus and rectum: Secondary | ICD-10-CM | POA: Diagnosis not present

## 2019-04-04 NOTE — Telephone Encounter (Signed)
Per Dr. Charlett Blake: [name] needed a GI referral for rectal bleeding which I placed tonight but please check on her and see how she is doing fi she has continued to have episodes she may need to go back to ER because this can get dangerous. if it has slowed down we should checka cbc and iron studies to confirm how anemic she is getting    I called patient left message for patient to give me a call back  Nurse triage may handle

## 2019-04-05 ENCOUNTER — Other Ambulatory Visit: Payer: Self-pay | Admitting: Family Medicine

## 2019-04-05 DIAGNOSIS — Z1159 Encounter for screening for other viral diseases: Secondary | ICD-10-CM | POA: Diagnosis not present

## 2019-04-05 DIAGNOSIS — K625 Hemorrhage of anus and rectum: Secondary | ICD-10-CM | POA: Diagnosis not present

## 2019-04-09 ENCOUNTER — Encounter: Payer: Self-pay | Admitting: Family Medicine

## 2019-04-09 DIAGNOSIS — Z86010 Personal history of colonic polyps: Secondary | ICD-10-CM | POA: Diagnosis not present

## 2019-04-09 DIAGNOSIS — D124 Benign neoplasm of descending colon: Secondary | ICD-10-CM | POA: Diagnosis not present

## 2019-04-09 DIAGNOSIS — K5730 Diverticulosis of large intestine without perforation or abscess without bleeding: Secondary | ICD-10-CM | POA: Diagnosis not present

## 2019-04-09 DIAGNOSIS — K6389 Other specified diseases of intestine: Secondary | ICD-10-CM | POA: Diagnosis not present

## 2019-04-12 DIAGNOSIS — D124 Benign neoplasm of descending colon: Secondary | ICD-10-CM | POA: Diagnosis not present

## 2019-04-23 ENCOUNTER — Encounter (INDEPENDENT_AMBULATORY_CARE_PROVIDER_SITE_OTHER): Payer: Self-pay

## 2019-05-05 DIAGNOSIS — R69 Illness, unspecified: Secondary | ICD-10-CM | POA: Diagnosis not present

## 2019-05-08 DIAGNOSIS — A09 Infectious gastroenteritis and colitis, unspecified: Secondary | ICD-10-CM | POA: Diagnosis not present

## 2019-05-08 DIAGNOSIS — K5900 Constipation, unspecified: Secondary | ICD-10-CM | POA: Diagnosis not present

## 2019-05-08 DIAGNOSIS — K625 Hemorrhage of anus and rectum: Secondary | ICD-10-CM | POA: Diagnosis not present

## 2019-05-10 DIAGNOSIS — R69 Illness, unspecified: Secondary | ICD-10-CM | POA: Diagnosis not present

## 2019-05-16 DIAGNOSIS — H2513 Age-related nuclear cataract, bilateral: Secondary | ICD-10-CM | POA: Diagnosis not present

## 2019-05-16 DIAGNOSIS — H5203 Hypermetropia, bilateral: Secondary | ICD-10-CM | POA: Diagnosis not present

## 2019-05-18 ENCOUNTER — Ambulatory Visit: Payer: Medicare HMO | Admitting: *Deleted

## 2019-06-04 DIAGNOSIS — R69 Illness, unspecified: Secondary | ICD-10-CM | POA: Diagnosis not present

## 2019-06-12 ENCOUNTER — Other Ambulatory Visit: Payer: Self-pay | Admitting: Family Medicine

## 2019-06-15 ENCOUNTER — Other Ambulatory Visit: Payer: Self-pay | Admitting: Family Medicine

## 2019-06-15 NOTE — Telephone Encounter (Signed)
Last OV 03/21/19 Last refill 12/01/18 #90/1 Next OV not scheduled

## 2019-06-17 ENCOUNTER — Other Ambulatory Visit: Payer: Self-pay | Admitting: Family Medicine

## 2019-07-02 ENCOUNTER — Ambulatory Visit: Payer: Self-pay | Admitting: Family Medicine

## 2019-07-02 NOTE — Telephone Encounter (Signed)
Pt reports husband tested positive for covid, resulted Saturday. Calling for advise regarding household precautions. Reviewed guidelines to pt's satisfaction. Advised to [facility] for any additional questions.  Did advise testing, 5-7 days from husbands test date as pt states she is "High risk"  And reports "Head cold."  Assured pt NT would route to practice for Dr. Frederik Pear review.   Reason for Disposition . Health Information question, no triage required and triager able to answer question  Answer Assessment - Initial Assessment Questions 1. REASON FOR CALL or QUESTION: "What is your reason for calling today?" or "How can I best help you?" or "What question do you have that I can help answer?"     Covid precautions  Protocols used: [place]

## 2019-07-04 NOTE — Telephone Encounter (Signed)
Set her up with a virtual visit

## 2019-07-05 NOTE — Telephone Encounter (Signed)
Called patient she stated she is doing fine and that her husband is sick not her.

## 2019-07-23 ENCOUNTER — Other Ambulatory Visit: Payer: Self-pay | Admitting: Medical

## 2019-08-23 DIAGNOSIS — L710 Perioral dermatitis: Secondary | ICD-10-CM | POA: Diagnosis not present

## 2019-09-13 DIAGNOSIS — L218 Other seborrheic dermatitis: Secondary | ICD-10-CM | POA: Diagnosis not present

## 2019-09-13 DIAGNOSIS — L710 Perioral dermatitis: Secondary | ICD-10-CM | POA: Diagnosis not present

## 2019-10-03 ENCOUNTER — Other Ambulatory Visit: Payer: Self-pay | Admitting: Obstetrics & Gynecology

## 2019-10-03 DIAGNOSIS — Z1231 Encounter for screening mammogram for malignant neoplasm of breast: Secondary | ICD-10-CM

## 2019-10-18 ENCOUNTER — Ambulatory Visit
Admission: RE | Admit: 2019-10-18 | Discharge: 2019-10-18 | Disposition: A | Discharge status: Home / Self Care | Payer: Medicare HMO | Source: Ambulatory Visit | Attending: Obstetrics & Gynecology | Admitting: Obstetrics & Gynecology

## 2019-10-18 ENCOUNTER — Other Ambulatory Visit: Payer: Self-pay

## 2019-10-18 DIAGNOSIS — Z1231 Encounter for screening mammogram for malignant neoplasm of breast: Secondary | ICD-10-CM | POA: Diagnosis not present

## 2019-10-19 ENCOUNTER — Other Ambulatory Visit: Payer: Self-pay | Admitting: Obstetrics & Gynecology

## 2019-10-19 DIAGNOSIS — R928 Other abnormal and inconclusive findings on diagnostic imaging of breast: Secondary | ICD-10-CM

## 2019-10-31 ENCOUNTER — Other Ambulatory Visit: Payer: Medicare HMO

## 2019-11-01 ENCOUNTER — Other Ambulatory Visit: Payer: Self-pay

## 2019-11-01 ENCOUNTER — Ambulatory Visit
Admission: RE | Admit: 2019-11-01 | Discharge: 2019-11-01 | Disposition: A | Discharge status: Home / Self Care | Payer: Medicare HMO | Source: Ambulatory Visit | Attending: Obstetrics & Gynecology | Admitting: Obstetrics & Gynecology

## 2019-11-01 DIAGNOSIS — N6012 Diffuse cystic mastopathy of left breast: Secondary | ICD-10-CM | POA: Diagnosis not present

## 2019-11-01 DIAGNOSIS — R928 Other abnormal and inconclusive findings on diagnostic imaging of breast: Secondary | ICD-10-CM

## 2019-11-01 DIAGNOSIS — R922 Inconclusive mammogram: Secondary | ICD-10-CM | POA: Diagnosis not present

## 2019-11-05 ENCOUNTER — Other Ambulatory Visit: Payer: Medicare HMO

## 2019-11-07 ENCOUNTER — Telehealth: Payer: Self-pay | Admitting: Oncology

## 2019-11-07 NOTE — Telephone Encounter (Signed)
Scheduled appt per 5/12 sch message - unable to reach pt . Left message with appt date and time   

## 2019-11-13 ENCOUNTER — Encounter: Payer: Self-pay | Admitting: General Practice
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
## 2019-11-21 ENCOUNTER — Inpatient Hospital Stay: Payer: Medicare HMO

## 2019-11-21 ENCOUNTER — Inpatient Hospital Stay: Payer: Medicare HMO | Attending: Oncology | Admitting: Oncology

## 2019-11-21 ENCOUNTER — Other Ambulatory Visit: Payer: Self-pay

## 2019-11-21 VITALS — BP 118/96 | HR 104 | Temp 98.7°F | Resp 18 | Ht 65.0 in | Wt 173.2 lb

## 2019-11-21 DIAGNOSIS — Z9481 Bone marrow transplant status: Secondary | ICD-10-CM | POA: Diagnosis not present

## 2019-11-21 DIAGNOSIS — E559 Vitamin D deficiency, unspecified: Secondary | ICD-10-CM

## 2019-11-21 DIAGNOSIS — C50811 Malignant neoplasm of overlapping sites of right female breast: Secondary | ICD-10-CM

## 2019-11-21 DIAGNOSIS — Z803 Family history of malignant neoplasm of breast: Secondary | ICD-10-CM | POA: Insufficient documentation

## 2019-11-21 DIAGNOSIS — M7989 Other specified soft tissue disorders: Secondary | ICD-10-CM | POA: Diagnosis not present

## 2019-11-21 DIAGNOSIS — Z9221 Personal history of antineoplastic chemotherapy: Secondary | ICD-10-CM | POA: Diagnosis not present

## 2019-11-21 DIAGNOSIS — S0300XD Dislocation of jaw, unspecified side, subsequent encounter: Secondary | ICD-10-CM | POA: Diagnosis not present

## 2019-11-21 DIAGNOSIS — Z9484 Stem cells transplant status: Secondary | ICD-10-CM | POA: Diagnosis not present

## 2019-11-21 DIAGNOSIS — Z170 Estrogen receptor positive status [ER+]: Secondary | ICD-10-CM

## 2019-11-21 DIAGNOSIS — N959 Unspecified menopausal and perimenopausal disorder: Secondary | ICD-10-CM | POA: Insufficient documentation

## 2019-11-21 DIAGNOSIS — Z853 Personal history of malignant neoplasm of breast: Secondary | ICD-10-CM | POA: Diagnosis not present

## 2019-11-21 DIAGNOSIS — M8580 Other specified disorders of bone density and structure, unspecified site: Secondary | ICD-10-CM | POA: Insufficient documentation

## 2019-11-21 LAB — CBC WITH DIFFERENTIAL (CANCER CENTER ONLY)
Abs Immature Granulocytes: 0.04 10*3/uL (ref 0.00–0.07)
Basophils Absolute: 0.1 10*3/uL (ref 0.0–0.1)
Basophils Relative: 1 %
Eosinophils Absolute: 0.1 10*3/uL (ref 0.0–0.5)
Eosinophils Relative: 1 %
HCT: 45.3 % (ref 36.0–46.0)
Hemoglobin: 14.6 g/dL (ref 12.0–15.0)
Immature Granulocytes: 1 %
Lymphocytes Relative: 25 %
Lymphs Abs: 2 10*3/uL (ref 0.7–4.0)
MCH: 31 pg (ref 26.0–34.0)
MCHC: 32.2 g/dL (ref 30.0–36.0)
MCV: 96.2 fL (ref 80.0–100.0)
Monocytes Absolute: 0.6 10*3/uL (ref 0.1–1.0)
Monocytes Relative: 8 %
Neutro Abs: 5 10*3/uL (ref 1.7–7.7)
Neutrophils Relative %: 64 %
Platelet Count: 287 10*3/uL (ref 150–400)
RBC: 4.71 MIL/uL (ref 3.87–5.11)
RDW: 12.8 % (ref 11.5–15.5)
WBC Count: 7.9 10*3/uL (ref 4.0–10.5)
nRBC: 0 % (ref 0.0–0.2)

## 2019-11-21 LAB — CMP (CANCER CENTER ONLY)
ALT: 18 U/L (ref 0–44)
AST: 26 U/L (ref 15–41)
Albumin: 3.9 g/dL (ref 3.5–5.0)
Alkaline Phosphatase: 111 U/L (ref 38–126)
Anion gap: 8 (ref 5–15)
BUN: 21 mg/dL (ref 8–23)
CO2: 32 mmol/L (ref 22–32)
Calcium: 9.1 mg/dL (ref 8.9–10.3)
Chloride: 101 mmol/L (ref 98–111)
Creatinine: 0.96 mg/dL (ref 0.44–1.00)
GFR, Est AFR Am: >60 mL/min (ref >60)
GFR, Estimated: >60 mL/min (ref >60)
Glucose, Bld: 112 mg/dL — ABNORMAL HIGH (ref 70–99)
Potassium: 3.7 mmol/L (ref 3.5–5.1)
Sodium: 141 mmol/L (ref 135–145)
Total Bilirubin: 0.5 mg/dL (ref 0.3–1.2)
Total Protein: 7.1 g/dL (ref 6.5–8.1)

## 2019-11-22 ENCOUNTER — Telehealth: Payer: Self-pay | Admitting: Oncology

## 2019-11-22 NOTE — Telephone Encounter (Signed)
Scheduled appt per 5/26 los. Left voicemail with appt date and time.

## 2019-11-23 ENCOUNTER — Ambulatory Visit: Payer: Medicare HMO | Admitting: Cardiology

## 2019-11-28 ENCOUNTER — Ambulatory Visit
Admission: RE | Admit: 2019-11-28 | Discharge: 2019-11-28 | Disposition: A | Discharge status: Home / Self Care | Payer: Medicare HMO | Source: Ambulatory Visit | Attending: Oncology | Admitting: Oncology

## 2019-11-28 DIAGNOSIS — N6012 Diffuse cystic mastopathy of left breast: Secondary | ICD-10-CM | POA: Diagnosis not present

## 2019-11-28 DIAGNOSIS — S0300XD Dislocation of jaw, unspecified side, subsequent encounter: Secondary | ICD-10-CM

## 2019-11-28 DIAGNOSIS — Z9481 Bone marrow transplant status: Secondary | ICD-10-CM

## 2019-11-28 DIAGNOSIS — C50811 Malignant neoplasm of overlapping sites of right female breast: Secondary | ICD-10-CM

## 2019-11-28 DIAGNOSIS — Z170 Estrogen receptor positive status [ER+]: Secondary | ICD-10-CM

## 2019-11-28 DIAGNOSIS — E559 Vitamin D deficiency, unspecified: Secondary | ICD-10-CM

## 2019-11-28 MED ORDER — GADOBUTROL 1 MMOL/ML IV SOLN
8.0000 mL | Freq: Once | INTRAVENOUS | Status: AC | PRN
  Administered 2019-11-28: 8 mL via INTRAVENOUS

## 2019-11-29 ENCOUNTER — Encounter: Payer: Self-pay | Admitting: Oncology

## 2019-11-30 ENCOUNTER — Telehealth: Payer: Self-pay | Admitting: Oncology

## 2019-11-30 NOTE — Telephone Encounter (Signed)
Faxed medical records to [facility] at [phone], Release ID: [id number]

## 2019-12-02 DIAGNOSIS — Z7189 Other specified counseling: Secondary | ICD-10-CM | POA: Insufficient documentation
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
## 2019-12-03 ENCOUNTER — Ambulatory Visit: Payer: Medicare HMO | Admitting: Cardiology

## 2019-12-07 ENCOUNTER — Other Ambulatory Visit: Payer: Self-pay | Admitting: Family Medicine
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
## 2019-12-10 ENCOUNTER — Other Ambulatory Visit: Payer: Self-pay

## 2019-12-10 ENCOUNTER — Ambulatory Visit: Payer: Medicare HMO | Admitting: Cardiology

## 2019-12-10 ENCOUNTER — Encounter: Payer: Self-pay | Admitting: Cardiology

## 2019-12-10 VITALS — BP 142/82 | HR 102 | Ht 65.0 in | Wt 174.0 lb

## 2019-12-10 DIAGNOSIS — I700 Atherosclerosis of aorta: Secondary | ICD-10-CM | POA: Diagnosis not present

## 2019-12-10 DIAGNOSIS — Q249 Congenital malformation of heart, unspecified: Secondary | ICD-10-CM | POA: Diagnosis not present

## 2019-12-10 DIAGNOSIS — I420 Dilated cardiomyopathy: Secondary | ICD-10-CM | POA: Diagnosis not present

## 2019-12-10 DIAGNOSIS — G720 Drug-induced myopathy: Secondary | ICD-10-CM | POA: Diagnosis not present

## 2019-12-10 DIAGNOSIS — E785 Hyperlipidemia, unspecified: Secondary | ICD-10-CM | POA: Diagnosis not present

## 2019-12-10 DIAGNOSIS — Z7189 Other specified counseling: Secondary | ICD-10-CM | POA: Diagnosis not present

## 2019-12-10 DIAGNOSIS — I2510 Atherosclerotic heart disease of native coronary artery without angina pectoris: Secondary | ICD-10-CM

## 2019-12-10 DIAGNOSIS — T466X5A Adverse effect of antihyperlipidemic and antiarteriosclerotic drugs, initial encounter: Secondary | ICD-10-CM | POA: Diagnosis not present

## 2019-12-10 NOTE — Patient Instructions (Signed)
Medication Instructions:  NO CHANGES *If you need a refill on your cardiac medications before your next appointment, please call your pharmacy*  Lab Work: Your physician recommends that you return for lab work ON DAY OF ECHO (FASTING LIPIDS) If you have labs (blood work) drawn and your tests are completely normal, you will receive your results only by: Marland Kitchen MyChart Message (if you have MyChart) OR . A paper copy in the mail If you have any lab test that is abnormal or we need to change your treatment, we will call you to review the results.  Testing/Procedures: Your physician has requested that you have an echocardiogram. Echocardiography is a painless test that uses sound waves to create images of your heart. It provides your doctor with information about the size and shape of your heart and how well your heart's chambers and valves are working. This procedure takes approximately one hour. There are no restrictions for this procedure. [place]  Follow-Up: At [facility], you and your health needs are our priority.  As part of our continuing mission to provide you with exceptional heart care, we have created designated Provider Care Teams.  These Care Teams include your primary Cardiologist (physician) and Advanced Practice Providers (APPs -  Physician Assistants and Nurse Practitioners) who all work together to provide you with the care you need, when you need it.  Your next appointment:   12 month(s)  You will receive a reminder letter in the mail two months in advance. If you don't receive a letter, please call our office to schedule the follow-up appointment.   The format for your next appointment:   In Person  Provider:   Minus Breeding, MD

## 2019-12-13 ENCOUNTER — Other Ambulatory Visit: Payer: Self-pay | Admitting: Family Medicine

## 2019-12-18 DIAGNOSIS — D0359 Melanoma in situ of other part of trunk: Secondary | ICD-10-CM | POA: Diagnosis not present

## 2019-12-18 DIAGNOSIS — D485 Neoplasm of uncertain behavior of skin: Secondary | ICD-10-CM | POA: Diagnosis not present

## 2019-12-23 ENCOUNTER — Encounter: Payer: Self-pay | Admitting: Oncology

## 2019-12-23 ENCOUNTER — Encounter: Payer: Self-pay | Admitting: Family Medicine

## 2019-12-24 DIAGNOSIS — L218 Other seborrheic dermatitis: Secondary | ICD-10-CM | POA: Diagnosis not present

## 2019-12-24 DIAGNOSIS — B0089 Other herpesviral infection: Secondary | ICD-10-CM | POA: Diagnosis not present

## 2019-12-25 DIAGNOSIS — C4359 Malignant melanoma of other part of trunk: Secondary | ICD-10-CM | POA: Diagnosis not present

## 2020-01-02 DIAGNOSIS — D485 Neoplasm of uncertain behavior of skin: Secondary | ICD-10-CM | POA: Diagnosis not present

## 2020-01-02 DIAGNOSIS — C4359 Malignant melanoma of other part of trunk: Secondary | ICD-10-CM | POA: Diagnosis not present

## 2020-01-03 DIAGNOSIS — C4359 Malignant melanoma of other part of trunk: Secondary | ICD-10-CM | POA: Diagnosis not present

## 2020-01-07 ENCOUNTER — Telehealth: Payer: Self-pay | Admitting: Pulmonary Disease

## 2020-01-07 NOTE — Telephone Encounter (Signed)
[name] had opening for 01/10/20 so her appt was moved to then  Nothing further needed

## 2020-01-08 ENCOUNTER — Other Ambulatory Visit: Payer: Medicare HMO

## 2020-01-08 ENCOUNTER — Other Ambulatory Visit: Payer: Self-pay

## 2020-01-08 ENCOUNTER — Ambulatory Visit (HOSPITAL_COMMUNITY): Payer: Medicare HMO | Attending: Cardiology

## 2020-01-08 DIAGNOSIS — I420 Dilated cardiomyopathy: Secondary | ICD-10-CM

## 2020-01-08 DIAGNOSIS — E785 Hyperlipidemia, unspecified: Secondary | ICD-10-CM | POA: Diagnosis not present

## 2020-01-08 LAB — LIPID PANEL
Chol/HDL Ratio: 3.8 ratio (ref 0.0–4.4)
Cholesterol, Total: 182 mg/dL (ref 100–199)
HDL: 48 mg/dL (ref >39)
LDL Chol Calc (NIH): 116 mg/dL — ABNORMAL HIGH (ref 0–99)
Triglycerides: 99 mg/dL (ref 0–149)
VLDL Cholesterol Cal: 18 mg/dL (ref 5–40)
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
## 2020-01-10 ENCOUNTER — Encounter: Payer: Self-pay | Admitting: Pulmonary Disease

## 2020-01-10 ENCOUNTER — Other Ambulatory Visit: Payer: Self-pay

## 2020-01-10 ENCOUNTER — Ambulatory Visit: Payer: Medicare HMO | Admitting: Pulmonary Disease

## 2020-01-10 ENCOUNTER — Ambulatory Visit (INDEPENDENT_AMBULATORY_CARE_PROVIDER_SITE_OTHER): Payer: Medicare HMO

## 2020-01-10 VITALS — BP 120/66 | HR 82 | Temp 98.2°F | Ht 65.0 in | Wt 167.8 lb

## 2020-01-10 DIAGNOSIS — J4530 Mild persistent asthma, uncomplicated: Secondary | ICD-10-CM

## 2020-01-10 DIAGNOSIS — Q676 Pectus excavatum: Secondary | ICD-10-CM | POA: Diagnosis not present

## 2020-01-10 DIAGNOSIS — Z7189 Other specified counseling: Secondary | ICD-10-CM

## 2020-01-10 DIAGNOSIS — R918 Other nonspecific abnormal finding of lung field: Secondary | ICD-10-CM | POA: Diagnosis not present

## 2020-01-10 NOTE — Assessment & Plan Note (Signed)
Clinically stable  Plan: Follow-up in 6 months

## 2020-01-10 NOTE — Assessment & Plan Note (Signed)
Reviewed last spirometry Reviewed last pulmonary function testing Walk today in office stable  Plan: Chest x-ray today Patient is mild to moderate risk for pulmonary complications Reviewed this complications with the patient Encourage incentive spirometer use, early ambulation, follow-up with our office if there are pulmonary changes status post surgery Follow-up with our office in 6 months

## 2020-01-10 NOTE — Patient Instructions (Addendum)
You were seen today by Lauraine Rinne, NP  for:   1. Surgical counseling visit  Walk today in office  Chest x-ray today   Peri-operative Assessment of Pulmonary Risk for Non-Thoracic Surgery:  [name], mild to moderate risk of perioperative pulmonary complications is increased by:  Diffusion defect in 2016  Hx of Pneumonitis   Asthma   Respiratory complications generally occur in 1% of ASA Class I patients, 5% of ASA Class II and 10% of ASA Class III-IV patients These complications rarely result in mortality and iclude postoperative pneumonia, atelectasis, pulmonary embolism, ARDS and increased time requiring postoperative mechanical ventilation.  Overall, I recommend proceeding with the surgery if the risk for respiratory complications are outweighed by the potential benefits. This will need to be discussed between the patient and surgeon.  To reduce risks of respiratory complications, I recommend:  --Pre- and post-operative incentive spirometry performed frequently while awake --Avoiding use of pancuronium during anesthesia. -- Close follow up with our office if there are changes in respiratory symptoms post surgery  I have discussed the risk factors and recommendations above with the patient.    Follow Up:    Return in about 6 months (around 07/12/2020), or if symptoms worsen or fail to improve, for Follow up with Dr. Lamonte Sakai.   Please do your part to reduce the spread of COVID-19:      Reduce your risk of any infection  and COVID19 by using the similar precautions used for avoiding the common cold or flu:  Marland Kitchen Wash your hands often with soap and warm water for at least 20 seconds.  If soap and water are not readily available, use an alcohol-based hand sanitizer with at least 60% alcohol.  . If coughing or sneezing, cover your mouth and nose by coughing or sneezing into the elbow areas of your shirt or coat, into Gordon tissue or into your sleeve (not your hands). [name]  MASK when in public  . Avoid shaking hands with others and consider head nods or verbal greetings only. . Avoid touching your eyes, nose, or mouth with unwashed hands.  . Avoid close contact with people who are sick. . Avoid places or events with large numbers of people in one location, like concerts or sporting events. . If you have some symptoms but not all symptoms, continue to monitor at home and seek medical attention if your symptoms worsen. . If you are having Gordon medical emergency, call 911.   [place] / e-Visit: [email or web address]         [facility]: [facility]: [phone]                   [facility]: [phone]     It is flu season:   >>> Best ways to protect herself from the flu: Receive the yearly flu vaccine, practice good hand hygiene washing with soap and also using hand sanitizer when available, eat Gordon nutritious meals, get adequate rest, hydrate appropriately   Please contact the office if your symptoms worsen or you have concerns that you are not improving.   Thank you for choosing  [facility] for your healthcare, and for allowing [place] to partner with you on your healthcare journey. I am thankful to be able to provide care to you today.   Wyn Quaker FNP-C

## 2020-01-11 DIAGNOSIS — J4530 Mild persistent asthma, uncomplicated: Secondary | ICD-10-CM

## 2020-01-11 DIAGNOSIS — Z7189 Other specified counseling: Secondary | ICD-10-CM

## 2020-01-11 NOTE — Telephone Encounter (Signed)
01/11/2020  Please let the patient know that the note has been addended.  Unsure where the "lobe" that she is referring to is.  I have updated this stem cell and removed that typo.  Appreciate the patient's understanding.  This was a typo.  The patient has any other concerns she can let us know.  Feel free to notify Dr. Lamonte Sakai that the note has been addended to appropriately reflect patient's recommendations without the typo.  My apologies  Wyn Quaker, FNP

## 2020-01-14 ENCOUNTER — Ambulatory Visit: Payer: Medicare HMO | Admitting: Pulmonary Disease

## 2020-01-14 ENCOUNTER — Telehealth: Payer: Self-pay | Admitting: Pulmonary Disease

## 2020-01-14 NOTE — Telephone Encounter (Signed)
01/14/2020  Please notify the patient that right lobe has been removed.  It has been corrected.  We are not deliberately trying to affect her scheduled surgery.  I have personally reached out to the patient.  She is under the impression that she needs full pulmonary function testing.  She is recommending that we contact the number below:  [facility] [phone]  Triage,  Please contact this telephone number above and confirmed that the patient needs full pulmonary function testing prior to her surgery on 01/28/2020.  Likely if she needs full pulmonary function testing this will need to be completed in the hospital.  I am unsure if we will be able to coordinate this in the outpatient setting with our clinic unless there is been a cancellation.  Please coordinate with the patient care coordinators to see how quickly we can get this done and then contact the patient to get her scheduled.  If unable to get completed in the hospital or in the outpatient setting here patient may need to proceed forward with going to [facility] to get the pulmonary function testing done.  Wyn Quaker, FNP

## 2020-01-14 NOTE — Telephone Encounter (Signed)
I have scheduled PFT and covid testing and the pt is aware  Nothing further needed

## 2020-01-14 NOTE — Telephone Encounter (Signed)
Aaron Edelman, please see new mychart message from pt.

## 2020-01-14 NOTE — Telephone Encounter (Signed)
Yes absolutely  Hold slot for her   Wyn Quaker FNP

## 2020-01-14 NOTE — Telephone Encounter (Signed)
Spoke with [name] at [facility]  She wants to know if Aaron Edelman would be willing to order PFT on this pt to be done prior to her surgery 01/28/20  She we have an opening right now for 01/17/20- she would need to go get covid tested tomorrow  Please advise thanks!

## 2020-01-15 ENCOUNTER — Other Ambulatory Visit (HOSPITAL_COMMUNITY)
Admission: RE | Admit: 2020-01-15 | Discharge: 2020-01-15 | Disposition: A | Discharge status: Home / Self Care | Payer: Medicare HMO | Source: Ambulatory Visit | Attending: Pulmonary Disease | Admitting: Pulmonary Disease

## 2020-01-15 DIAGNOSIS — Z01812 Encounter for preprocedural laboratory examination: Secondary | ICD-10-CM | POA: Insufficient documentation

## 2020-01-15 DIAGNOSIS — Z20822 Contact with and (suspected) exposure to covid-19: Secondary | ICD-10-CM | POA: Diagnosis not present

## 2020-01-15 LAB — SARS CORONAVIRUS 2 (TAT 6-24 HRS): SARS Coronavirus 2: NEGATIVE

## 2020-01-17 ENCOUNTER — Ambulatory Visit (INDEPENDENT_AMBULATORY_CARE_PROVIDER_SITE_OTHER): Payer: Medicare HMO | Admitting: Emergency Medicine

## 2020-01-17 ENCOUNTER — Other Ambulatory Visit: Payer: Self-pay

## 2020-01-17 DIAGNOSIS — Z7189 Other specified counseling: Secondary | ICD-10-CM

## 2020-01-17 LAB — PULMONARY FUNCTION TEST
DL/VA % pred: 117 %
DL/VA: 4.91 ml/min/mmHg/L
DLCO cor % pred: 104 %
DLCO cor: 21.69 ml/min/mmHg
DLCO unc % pred: 104 %
DLCO unc: 21.69 ml/min/mmHg
FEF 25-75 Post: 2.1 L/sec
FEF 25-75 Pre: 2.51 L/sec
FEF2575-%Change-Post: -16 %
FEF2575-%Pred-Post: 90 %
FEF2575-%Pred-Pre: 108 %
FEV1-%Change-Post: -2 %
FEV1-%Pred-Post: 88 %
FEV1-%Pred-Pre: 90 %
FEV1-Post: 2.3 L
FEV1-Pre: 2.35 L
FEV1FVC-%Change-Post: -2 %
FEV1FVC-%Pred-Pre: 105 %
FEV6-%Change-Post: 0 %
FEV6-%Pred-Post: 87 %
FEV6-%Pred-Pre: 87 %
FEV6-Post: 2.85 L
FEV6-Pre: 2.85 L
FEV6FVC-%Change-Post: 0 %
FEV6FVC-%Pred-Post: 103 %
FEV6FVC-%Pred-Pre: 103 %
FVC-%Change-Post: 0 %
FVC-%Pred-Post: 84 %
FVC-%Pred-Pre: 84 %
FVC-Post: 2.85 L
FVC-Pre: 2.85 L
Post FEV1/FVC ratio: 81 %
Post FEV6/FVC ratio: 100 %
Pre FEV1/FVC ratio: 82 %
Pre FEV6/FVC Ratio: 100 %
RV % pred: 95 %
RV: 1.98 L
TLC % pred: 94 %
TLC: 4.92 L

## 2020-01-17 NOTE — Progress Notes (Signed)
Full PFT performed today. °

## 2020-01-18 DIAGNOSIS — M1711 Unilateral primary osteoarthritis, right knee: Secondary | ICD-10-CM | POA: Diagnosis not present

## 2020-01-18 DIAGNOSIS — M25562 Pain in left knee: Secondary | ICD-10-CM | POA: Diagnosis not present

## 2020-01-18 NOTE — Progress Notes (Signed)
Tried calling the pt and there was no answer- LMTCB.  

## 2020-01-21 ENCOUNTER — Telehealth: Payer: Self-pay | Admitting: Pulmonary Disease

## 2020-01-21 NOTE — Telephone Encounter (Signed)
Lauraine Rinne, NP  01/17/2020 4:16 PM EDT     Patient's pulmonary function testing today shows no significant issues taking a full deep breath in or blowing air out. This is reassuring. Does not show bronchodilator response. She also has no issues with processing oxygen. This is good news. This is a stable pulmonary function test. Shows improved oxygenation when compared to last pulmonary function test 5 years ago.  Please make sure that patient's surgical team at [facility] is notified: [facility] [phone]  They should be able to view the records but if they need this faxed or routed to them please coordinate.  Wyn Quaker, FNP   Spoke with Nevin Bloodgood  She states can not see the PFT in care everywhere  I have faxed this to her at [phone]

## 2020-01-22 ENCOUNTER — Telehealth: Payer: Self-pay | Admitting: Pulmonary Disease

## 2020-01-22 NOTE — Telephone Encounter (Signed)
Called [facility] at [phone] and left a detailed message regarding the need for full PFT for this patient prior to surgery and asked that she return our call to clarify if the PFT is needed. I called [phone], the # left on the VM for [facility] for scheduling and they were not able to answer the question regarding pulmonary testing. Will await a return call from [facility]

## 2020-01-22 NOTE — Telephone Encounter (Signed)
Christina Rinne, NP sent to [name] Triage Pool Patient's pulmonary function testing today shows no significant issues taking a full deep breath in or blowing air out. This is reassuring. Does not show bronchodilator response. She also has no issues with processing oxygen. This is good news. This is a stable pulmonary function test. Shows improved oxygenation when compared to last pulmonary function test 5 years ago.  Please make sure that patient's surgical team at [facility] is notified: [name] [phone]   They should be able to view the records but if they need this faxed or routed to them please coordinate.  Christina Quaker, FNP  ---------------------------------  Spoke with the pt and notified of results. I have mailed to her per her request and verified her address. She is aware that [name] already has a copy.

## 2020-01-22 NOTE — Progress Notes (Signed)
Spoke with the pt and notified of results. She verbalized understanding. I have mailed her a copy per her request. She is aware that Nevin Bloodgood has already been sent a copy.

## 2020-01-23 ENCOUNTER — Telehealth: Payer: Self-pay | Admitting: Pulmonary Disease

## 2020-01-23 NOTE — Telephone Encounter (Signed)
Closing encounter as another telephone encounter was opened, patient has been scheduled for a PFT and covid test.  Patient aware.  Nothing further needed.

## 2020-01-23 NOTE — Telephone Encounter (Signed)
This request has been handled in another encounter.

## 2020-01-25 DIAGNOSIS — R69 Illness, unspecified: Secondary | ICD-10-CM | POA: Diagnosis not present

## 2020-01-25 DIAGNOSIS — J4520 Mild intermittent asthma, uncomplicated: Secondary | ICD-10-CM | POA: Diagnosis not present

## 2020-01-25 DIAGNOSIS — M1990 Unspecified osteoarthritis, unspecified site: Secondary | ICD-10-CM | POA: Diagnosis not present

## 2020-01-25 DIAGNOSIS — I428 Other cardiomyopathies: Secondary | ICD-10-CM | POA: Diagnosis not present

## 2020-01-25 DIAGNOSIS — Z20822 Contact with and (suspected) exposure to covid-19: Secondary | ICD-10-CM | POA: Diagnosis not present

## 2020-01-25 DIAGNOSIS — Z01818 Encounter for other preprocedural examination: Secondary | ICD-10-CM | POA: Diagnosis not present

## 2020-01-25 DIAGNOSIS — Z01812 Encounter for preprocedural laboratory examination: Secondary | ICD-10-CM | POA: Diagnosis not present

## 2020-01-25 DIAGNOSIS — E039 Hypothyroidism, unspecified: Secondary | ICD-10-CM | POA: Diagnosis not present

## 2020-01-28 DIAGNOSIS — I509 Heart failure, unspecified: Secondary | ICD-10-CM | POA: Diagnosis not present

## 2020-01-28 DIAGNOSIS — I428 Other cardiomyopathies: Secondary | ICD-10-CM | POA: Diagnosis not present

## 2020-01-28 DIAGNOSIS — Z96641 Presence of right artificial hip joint: Secondary | ICD-10-CM | POA: Diagnosis not present

## 2020-01-28 DIAGNOSIS — C439 Malignant melanoma of skin, unspecified: Secondary | ICD-10-CM | POA: Diagnosis not present

## 2020-01-28 DIAGNOSIS — Z9221 Personal history of antineoplastic chemotherapy: Secondary | ICD-10-CM | POA: Diagnosis not present

## 2020-01-28 DIAGNOSIS — Z823 Family history of stroke: Secondary | ICD-10-CM | POA: Diagnosis not present

## 2020-01-28 DIAGNOSIS — C4359 Malignant melanoma of other part of trunk: Secondary | ICD-10-CM | POA: Diagnosis not present

## 2020-01-28 DIAGNOSIS — J4520 Mild intermittent asthma, uncomplicated: Secondary | ICD-10-CM | POA: Diagnosis not present

## 2020-01-28 DIAGNOSIS — I2510 Atherosclerotic heart disease of native coronary artery without angina pectoris: Secondary | ICD-10-CM | POA: Diagnosis not present

## 2020-01-28 DIAGNOSIS — Z885 Allergy status to narcotic agent status: Secondary | ICD-10-CM | POA: Diagnosis not present

## 2020-01-28 DIAGNOSIS — Z9481 Bone marrow transplant status: Secondary | ICD-10-CM | POA: Diagnosis not present

## 2020-02-01 DIAGNOSIS — T466X5A Adverse effect of antihyperlipidemic and antiarteriosclerotic drugs, initial encounter: Secondary | ICD-10-CM | POA: Insufficient documentation

## 2020-02-01 DIAGNOSIS — G720 Drug-induced myopathy: Secondary | ICD-10-CM | POA: Insufficient documentation

## 2020-02-05 DIAGNOSIS — C439 Malignant melanoma of skin, unspecified: Secondary | ICD-10-CM | POA: Diagnosis not present

## 2020-02-07 DIAGNOSIS — Z09 Encounter for follow-up examination after completed treatment for conditions other than malignant neoplasm: Secondary | ICD-10-CM | POA: Diagnosis not present

## 2020-02-07 DIAGNOSIS — C4352 Malignant melanoma of skin of breast: Secondary | ICD-10-CM | POA: Diagnosis not present

## 2020-02-18 ENCOUNTER — Other Ambulatory Visit: Payer: Self-pay | Admitting: Oncology

## 2020-03-18 DIAGNOSIS — M1811 Unilateral primary osteoarthritis of first carpometacarpal joint, right hand: Secondary | ICD-10-CM | POA: Diagnosis not present

## 2020-03-18 DIAGNOSIS — M79641 Pain in right hand: Secondary | ICD-10-CM | POA: Diagnosis not present

## 2020-03-21 ENCOUNTER — Telehealth: Payer: Self-pay | Admitting: *Deleted

## 2020-03-21 NOTE — Telephone Encounter (Signed)
[facility] faxed over a request   Patient request a refill on Nystatin powder.  I do not see where this was filled by you.

## 2020-03-22 ENCOUNTER — Other Ambulatory Visit: Payer: Self-pay | Admitting: Family Medicine

## 2020-03-22 MED ORDER — NYSTATIN 100000 UNIT/GM EX POWD: 1.0000 "application " | Freq: Two times a day (BID) | CUTANEOUS | 1 refills | Status: DC | PRN

## 2020-03-22 NOTE — Telephone Encounter (Signed)
Sent to pharmacy 

## 2020-04-06 ENCOUNTER — Other Ambulatory Visit: Payer: Self-pay | Admitting: Medical

## 2020-04-25 ENCOUNTER — Other Ambulatory Visit: Payer: Self-pay

## 2020-04-25 ENCOUNTER — Telehealth (INDEPENDENT_AMBULATORY_CARE_PROVIDER_SITE_OTHER): Payer: Medicare HMO | Admitting: Family Medicine

## 2020-04-25 ENCOUNTER — Encounter: Payer: Self-pay | Admitting: Family Medicine

## 2020-04-25 VITALS — BP 139/87 | HR 102 | Ht 65.0 in | Wt 166.0 lb

## 2020-04-25 DIAGNOSIS — J0140 Acute pansinusitis, unspecified: Secondary | ICD-10-CM

## 2020-04-25 MED ORDER — AMOXICILLIN-POT CLAVULANATE 875-125 MG PO TABS: 1.0000 | ORAL_TABLET | Freq: Two times a day (BID) | ORAL | 0 refills | Status: DC

## 2020-04-25 MED ORDER — PREDNISONE 10 MG PO TABS: ORAL_TABLET | ORAL | 0 refills | Status: DC
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
## 2020-04-28 DIAGNOSIS — Z08 Encounter for follow-up examination after completed treatment for malignant neoplasm: Secondary | ICD-10-CM | POA: Diagnosis not present

## 2020-04-28 DIAGNOSIS — L814 Other melanin hyperpigmentation: Secondary | ICD-10-CM | POA: Diagnosis not present

## 2020-04-28 DIAGNOSIS — Z85820 Personal history of malignant melanoma of skin: Secondary | ICD-10-CM | POA: Diagnosis not present

## 2020-04-28 DIAGNOSIS — L430 Hypertrophic lichen planus: Secondary | ICD-10-CM | POA: Diagnosis not present

## 2020-04-28 DIAGNOSIS — Z1283 Encounter for screening for malignant neoplasm of skin: Secondary | ICD-10-CM | POA: Diagnosis not present

## 2020-04-28 DIAGNOSIS — D225 Melanocytic nevi of trunk: Secondary | ICD-10-CM | POA: Diagnosis not present

## 2020-04-30 DIAGNOSIS — Z6829 Body mass index (BMI) 29.0-29.9, adult: Secondary | ICD-10-CM | POA: Diagnosis not present

## 2020-04-30 DIAGNOSIS — Z01419 Encounter for gynecological examination (general) (routine) without abnormal findings: Secondary | ICD-10-CM | POA: Diagnosis not present

## 2020-05-13 ENCOUNTER — Other Ambulatory Visit: Payer: Self-pay | Admitting: Obstetrics & Gynecology

## 2020-05-13 DIAGNOSIS — E2839 Other primary ovarian failure: Secondary | ICD-10-CM

## 2020-05-13 DIAGNOSIS — M899 Disorder of bone, unspecified: Secondary | ICD-10-CM

## 2020-05-19 DIAGNOSIS — N950 Postmenopausal bleeding: Secondary | ICD-10-CM | POA: Diagnosis not present

## 2020-05-27 DIAGNOSIS — H52203 Unspecified astigmatism, bilateral: Secondary | ICD-10-CM | POA: Diagnosis not present

## 2020-05-27 DIAGNOSIS — H2513 Age-related nuclear cataract, bilateral: Secondary | ICD-10-CM | POA: Diagnosis not present

## 2020-06-07 ENCOUNTER — Other Ambulatory Visit: Payer: Self-pay | Admitting: Family Medicine

## 2020-06-13 ENCOUNTER — Other Ambulatory Visit: Payer: Self-pay | Admitting: Family Medicine

## 2020-06-18 DIAGNOSIS — Z96641 Presence of right artificial hip joint: Secondary | ICD-10-CM | POA: Diagnosis not present

## 2020-06-18 DIAGNOSIS — M7061 Trochanteric bursitis, right hip: Secondary | ICD-10-CM | POA: Diagnosis not present

## 2020-07-11 ENCOUNTER — Other Ambulatory Visit: Payer: Self-pay | Admitting: Family Medicine

## 2020-08-06 DIAGNOSIS — C439 Malignant melanoma of skin, unspecified: Secondary | ICD-10-CM | POA: Diagnosis not present

## 2020-08-18 ENCOUNTER — Other Ambulatory Visit: Payer: Self-pay | Admitting: Family Medicine

## 2020-08-20 ENCOUNTER — Ambulatory Visit
Admission: RE | Admit: 2020-08-20 | Discharge: 2020-08-20 | Disposition: A | Discharge status: Home / Self Care | Payer: Medicare HMO | Source: Ambulatory Visit | Attending: Obstetrics & Gynecology | Admitting: Obstetrics & Gynecology

## 2020-08-20 ENCOUNTER — Other Ambulatory Visit: Payer: Self-pay

## 2020-08-20 DIAGNOSIS — E2839 Other primary ovarian failure: Secondary | ICD-10-CM

## 2020-08-20 DIAGNOSIS — Z780 Asymptomatic menopausal state: Secondary | ICD-10-CM | POA: Diagnosis not present

## 2020-08-20 DIAGNOSIS — M899 Disorder of bone, unspecified: Secondary | ICD-10-CM

## 2020-08-20 DIAGNOSIS — M8589 Other specified disorders of bone density and structure, multiple sites: Secondary | ICD-10-CM | POA: Diagnosis not present

## 2020-08-26 ENCOUNTER — Other Ambulatory Visit: Payer: Self-pay | Admitting: Family Medicine

## 2020-09-02 ENCOUNTER — Other Ambulatory Visit: Payer: Self-pay | Admitting: Family Medicine

## 2020-09-22 DIAGNOSIS — M1811 Unilateral primary osteoarthritis of first carpometacarpal joint, right hand: Secondary | ICD-10-CM | POA: Diagnosis not present

## 2020-10-03 ENCOUNTER — Other Ambulatory Visit: Payer: Self-pay | Admitting: Family Medicine

## 2020-10-16 ENCOUNTER — Other Ambulatory Visit: Payer: Self-pay | Admitting: Medical

## 2020-10-28 DIAGNOSIS — Z96641 Presence of right artificial hip joint: Secondary | ICD-10-CM | POA: Diagnosis not present

## 2020-11-05 DIAGNOSIS — Z08 Encounter for follow-up examination after completed treatment for malignant neoplasm: Secondary | ICD-10-CM | POA: Diagnosis not present

## 2020-11-05 DIAGNOSIS — L0202 Furuncle of face: Secondary | ICD-10-CM | POA: Diagnosis not present

## 2020-11-05 DIAGNOSIS — D225 Melanocytic nevi of trunk: Secondary | ICD-10-CM | POA: Diagnosis not present

## 2020-11-05 DIAGNOSIS — B9689 Other specified bacterial agents as the cause of diseases classified elsewhere: Secondary | ICD-10-CM | POA: Diagnosis not present

## 2020-11-05 DIAGNOSIS — L218 Other seborrheic dermatitis: Secondary | ICD-10-CM | POA: Diagnosis not present

## 2020-11-05 DIAGNOSIS — B0089 Other herpesviral infection: Secondary | ICD-10-CM | POA: Diagnosis not present

## 2020-11-05 DIAGNOSIS — Z85820 Personal history of malignant melanoma of skin: Secondary | ICD-10-CM | POA: Diagnosis not present

## 2020-11-05 DIAGNOSIS — Z1283 Encounter for screening for malignant neoplasm of skin: Secondary | ICD-10-CM | POA: Diagnosis not present

## 2020-11-13 ENCOUNTER — Other Ambulatory Visit: Payer: Self-pay | Admitting: Medical

## 2020-11-18 ENCOUNTER — Telehealth: Payer: Self-pay | Admitting: Oncology

## 2020-11-18 NOTE — Telephone Encounter (Signed)
Pt called in to r/s appt. Pt aware of updated appt date and time.

## 2020-11-20 ENCOUNTER — Inpatient Hospital Stay: Payer: Medicare HMO | Admitting: Oncology

## 2020-11-20 ENCOUNTER — Inpatient Hospital Stay: Payer: Medicare HMO

## 2020-11-25 ENCOUNTER — Other Ambulatory Visit: Payer: Self-pay | Admitting: Family Medicine

## 2020-12-03 DIAGNOSIS — M79604 Pain in right leg: Secondary | ICD-10-CM | POA: Diagnosis not present

## 2020-12-03 DIAGNOSIS — M25551 Pain in right hip: Secondary | ICD-10-CM | POA: Diagnosis not present

## 2020-12-05 DIAGNOSIS — M25551 Pain in right hip: Secondary | ICD-10-CM | POA: Diagnosis not present

## 2020-12-05 DIAGNOSIS — M5416 Radiculopathy, lumbar region: Secondary | ICD-10-CM | POA: Diagnosis not present

## 2020-12-10 ENCOUNTER — Other Ambulatory Visit: Payer: Self-pay | Admitting: Family Medicine

## 2020-12-11 ENCOUNTER — Other Ambulatory Visit: Payer: Self-pay | Admitting: Family Medicine

## 2020-12-11 DIAGNOSIS — Z1231 Encounter for screening mammogram for malignant neoplasm of breast: Secondary | ICD-10-CM

## 2020-12-16 DIAGNOSIS — M533 Sacrococcygeal disorders, not elsewhere classified: Secondary | ICD-10-CM | POA: Diagnosis not present

## 2020-12-16 DIAGNOSIS — M5136 Other intervertebral disc degeneration, lumbar region: Secondary | ICD-10-CM | POA: Diagnosis not present

## 2020-12-16 DIAGNOSIS — M25551 Pain in right hip: Secondary | ICD-10-CM | POA: Diagnosis not present

## 2021-01-05 ENCOUNTER — Encounter: Payer: Self-pay | Admitting: Oncology

## 2021-01-05 ENCOUNTER — Inpatient Hospital Stay: Payer: Medicare HMO

## 2021-01-05 ENCOUNTER — Other Ambulatory Visit: Payer: Self-pay

## 2021-01-05 ENCOUNTER — Inpatient Hospital Stay: Payer: Medicare HMO | Attending: Oncology | Admitting: Oncology

## 2021-01-05 ENCOUNTER — Other Ambulatory Visit: Payer: Self-pay | Admitting: Oncology

## 2021-01-05 VITALS — BP 135/60 | HR 80 | Temp 97.9°F | Resp 18 | Ht 65.0 in | Wt 178.8 lb

## 2021-01-05 DIAGNOSIS — Z853 Personal history of malignant neoplasm of breast: Secondary | ICD-10-CM | POA: Insufficient documentation

## 2021-01-05 DIAGNOSIS — C50811 Malignant neoplasm of overlapping sites of right female breast: Secondary | ICD-10-CM

## 2021-01-05 DIAGNOSIS — M8580 Other specified disorders of bone density and structure, unspecified site: Secondary | ICD-10-CM | POA: Insufficient documentation

## 2021-01-05 DIAGNOSIS — Z170 Estrogen receptor positive status [ER+]: Secondary | ICD-10-CM | POA: Diagnosis not present

## 2021-01-05 DIAGNOSIS — E559 Vitamin D deficiency, unspecified: Secondary | ICD-10-CM | POA: Diagnosis not present

## 2021-01-05 DIAGNOSIS — Z9481 Bone marrow transplant status: Secondary | ICD-10-CM | POA: Diagnosis not present

## 2021-01-05 LAB — CBC WITH DIFFERENTIAL (CANCER CENTER ONLY)
Abs Immature Granulocytes: 0.05 10*3/uL (ref 0.00–0.07)
Basophils Absolute: 0.1 10*3/uL (ref 0.0–0.1)
Basophils Relative: 1 %
Eosinophils Absolute: 0.2 10*3/uL (ref 0.0–0.5)
Eosinophils Relative: 3 %
HCT: 39.4 % (ref 36.0–46.0)
Hemoglobin: 13.2 g/dL (ref 12.0–15.0)
Immature Granulocytes: 1 %
Lymphocytes Relative: 31 %
Lymphs Abs: 2.2 10*3/uL (ref 0.7–4.0)
MCH: 31.8 pg (ref 26.0–34.0)
MCHC: 33.5 g/dL (ref 30.0–36.0)
MCV: 94.9 fL (ref 80.0–100.0)
Monocytes Absolute: 0.6 10*3/uL (ref 0.1–1.0)
Monocytes Relative: 9 %
Neutro Abs: 4.1 10*3/uL (ref 1.7–7.7)
Neutrophils Relative %: 55 %
Platelet Count: 259 10*3/uL (ref 150–400)
RBC: 4.15 MIL/uL (ref 3.87–5.11)
RDW: 12.5 % (ref 11.5–15.5)
WBC Count: 7.3 10*3/uL (ref 4.0–10.5)
nRBC: 0 % (ref 0.0–0.2)

## 2021-01-05 LAB — CMP (CANCER CENTER ONLY)
ALT: 19 U/L (ref 0–44)
AST: 25 U/L (ref 15–41)
Albumin: 3.5 g/dL (ref 3.5–5.0)
Alkaline Phosphatase: 85 U/L (ref 38–126)
Anion gap: 6 (ref 5–15)
BUN: 15 mg/dL (ref 8–23)
CO2: 29 mmol/L (ref 22–32)
Calcium: 8.8 mg/dL — ABNORMAL LOW (ref 8.9–10.3)
Chloride: 105 mmol/L (ref 98–111)
Creatinine: 1.15 mg/dL — ABNORMAL HIGH (ref 0.44–1.00)
GFR, Estimated: 54 mL/min — ABNORMAL LOW (ref >60)
Glucose, Bld: 91 mg/dL (ref 70–99)
Potassium: 4.5 mmol/L (ref 3.5–5.1)
Sodium: 140 mmol/L (ref 135–145)
Total Bilirubin: 0.3 mg/dL (ref 0.3–1.2)
Total Protein: 6.4 g/dL — ABNORMAL LOW (ref 6.5–8.1)
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
## 2021-01-07 ENCOUNTER — Telehealth: Payer: Self-pay | Admitting: Oncology

## 2021-01-07 DIAGNOSIS — M533 Sacrococcygeal disorders, not elsewhere classified: Secondary | ICD-10-CM | POA: Diagnosis not present

## 2021-01-07 NOTE — Telephone Encounter (Signed)
Scheduled per 7/11 los. Called pt and left a msg

## 2021-01-16 DIAGNOSIS — M7061 Trochanteric bursitis, right hip: Secondary | ICD-10-CM | POA: Diagnosis not present

## 2021-01-20 ENCOUNTER — Other Ambulatory Visit: Payer: Self-pay | Admitting: Orthopedic Surgery

## 2021-01-20 ENCOUNTER — Other Ambulatory Visit (HOSPITAL_COMMUNITY): Payer: Self-pay | Admitting: Orthopedic Surgery

## 2021-01-20 DIAGNOSIS — M7061 Trochanteric bursitis, right hip: Secondary | ICD-10-CM

## 2021-01-29 DIAGNOSIS — S9031XA Contusion of right foot, initial encounter: Secondary | ICD-10-CM | POA: Diagnosis not present

## 2021-02-03 ENCOUNTER — Ambulatory Visit (HOSPITAL_COMMUNITY)
Admission: RE | Admit: 2021-02-03 | Discharge: 2021-02-03 | Disposition: A | Discharge status: Home / Self Care | Payer: Medicare HMO | Source: Ambulatory Visit | Attending: Orthopedic Surgery | Admitting: Orthopedic Surgery

## 2021-02-03 ENCOUNTER — Encounter (HOSPITAL_COMMUNITY)
Admission: RE | Admit: 2021-02-03 | Discharge: 2021-02-03 | Disposition: A | Discharge status: Home / Self Care | Payer: Medicare HMO | Source: Ambulatory Visit | Attending: Orthopedic Surgery | Admitting: Orthopedic Surgery

## 2021-02-03 ENCOUNTER — Other Ambulatory Visit: Payer: Self-pay

## 2021-02-03 DIAGNOSIS — M7061 Trochanteric bursitis, right hip: Secondary | ICD-10-CM

## 2021-02-03 DIAGNOSIS — Z471 Aftercare following joint replacement surgery: Secondary | ICD-10-CM | POA: Diagnosis not present

## 2021-02-03 DIAGNOSIS — Z96641 Presence of right artificial hip joint: Secondary | ICD-10-CM | POA: Diagnosis not present

## 2021-02-03 DIAGNOSIS — M25551 Pain in right hip: Secondary | ICD-10-CM | POA: Diagnosis not present

## 2021-02-03 MED ORDER — TECHNETIUM TC 99M MEDRONATE IV KIT
20.0000 | PACK | Freq: Once | INTRAVENOUS | Status: AC | PRN
  Administered 2021-02-03: 20 via INTRAVENOUS

## 2021-02-04 ENCOUNTER — Ambulatory Visit
Admission: RE | Admit: 2021-02-04 | Discharge: 2021-02-04 | Disposition: A | Discharge status: Home / Self Care | Payer: Medicare HMO | Source: Ambulatory Visit | Attending: Family Medicine | Admitting: Family Medicine

## 2021-02-04 DIAGNOSIS — Z1231 Encounter for screening mammogram for malignant neoplasm of breast: Secondary | ICD-10-CM

## 2021-02-05 DIAGNOSIS — M79671 Pain in right foot: Secondary | ICD-10-CM | POA: Diagnosis not present

## 2021-02-09 DIAGNOSIS — C439 Malignant melanoma of skin, unspecified: Secondary | ICD-10-CM | POA: Diagnosis not present

## 2021-02-18 ENCOUNTER — Telehealth: Payer: Self-pay | Admitting: Family Medicine

## 2021-02-18 ENCOUNTER — Other Ambulatory Visit: Payer: Self-pay | Admitting: Family Medicine

## 2021-02-18 MED ORDER — DICYCLOMINE HCL 20 MG PO TABS: 20.0000 mg | ORAL_TABLET | Freq: Two times a day (BID) | ORAL | 1 refills | Status: DC

## 2021-02-18 NOTE — Telephone Encounter (Signed)
Medication: dicyclomine (BENTYL) 20 MG tablet   Has the patient contacted their pharmacy? Yes.   (If no, request that the patient contact the pharmacy for the refill.) (If yes, when and what did the pharmacy advise?)  Preferred Pharmacy (with phone number or street name): [id number] [place] [address] [place] [address] Phone:  [phone] Fax:  [phone]  Agent: Please be advised that RX refills may take up to 3 business days. We ask that you follow-up with your pharmacy.

## 2021-03-01 ENCOUNTER — Other Ambulatory Visit: Payer: Self-pay | Admitting: Family Medicine

## 2021-03-11 ENCOUNTER — Other Ambulatory Visit: Payer: Self-pay | Admitting: Family Medicine

## 2021-03-13 ENCOUNTER — Encounter: Payer: Self-pay | Admitting: Family Medicine

## 2021-03-17 ENCOUNTER — Telehealth: Payer: Self-pay | Admitting: Family Medicine

## 2021-03-17 ENCOUNTER — Other Ambulatory Visit: Payer: Self-pay | Admitting: Family Medicine

## 2021-03-17 MED ORDER — BUPROPION HCL ER (XL) 150 MG PO TB24: 150.0000 mg | ORAL_TABLET | Freq: Every day | ORAL | 2 refills | Status: DC

## 2021-03-17 NOTE — Telephone Encounter (Signed)
I have called the pt back and she stated that she will accept the appointment on 06/01/21 @ 11:20am.   Pt would like to know how to safely reduce the medication. Pt will do an in person visit.

## 2021-03-17 NOTE — Telephone Encounter (Signed)
Pt. Called in. I offered to schedule appointment based off instructions and pt. Declined. She was not willing to wait that long for an appointment even after expressing the process including sending in a new rx.

## 2021-03-18 ENCOUNTER — Telehealth: Payer: Self-pay | Admitting: Family Medicine

## 2021-03-18 NOTE — Telephone Encounter (Signed)
Patient called in regarding medication questions and would need for someone to give her a call to go over information. Please advise.   Medication: buPROPion (WELLBUTRIN XL) 150 MG 24 hr tablet

## 2021-03-18 NOTE — Telephone Encounter (Signed)
Patient stated she is not comfortable with going from 300mg  to 150mg  without tapering. She would like a tapered dose.  She is worried about having a stroke.  She has been reading online.

## 2021-03-19 NOTE — Telephone Encounter (Signed)
Pt. Returned call. Michela Pitcher can call back at anytime

## 2021-03-19 NOTE — Telephone Encounter (Signed)
Left message on machine to call back  

## 2021-03-19 NOTE — Telephone Encounter (Signed)
Patient notified.  She will take either every other day for next couple of weeks.

## 2021-03-26 ENCOUNTER — Telehealth: Payer: Self-pay | Admitting: Family Medicine

## 2021-03-26 NOTE — Telephone Encounter (Signed)
Left message for patient to call back and schedule Medicare Annual Wellness Visit (AWV) in office.   If not able to come in office, please offer to do virtually or by telephone.  Left office number and my jabber [phone].  Last AWV:05/15/2018  Please schedule at anytime with Nurse Health Advisor.

## 2021-04-16 DIAGNOSIS — Z96641 Presence of right artificial hip joint: Secondary | ICD-10-CM | POA: Diagnosis not present

## 2021-04-16 DIAGNOSIS — M25551 Pain in right hip: Secondary | ICD-10-CM | POA: Diagnosis not present

## 2021-04-17 ENCOUNTER — Other Ambulatory Visit: Payer: Self-pay | Admitting: Family Medicine

## 2021-05-06 DIAGNOSIS — L218 Other seborrheic dermatitis: Secondary | ICD-10-CM | POA: Diagnosis not present

## 2021-05-06 DIAGNOSIS — Z08 Encounter for follow-up examination after completed treatment for malignant neoplasm: Secondary | ICD-10-CM | POA: Diagnosis not present

## 2021-05-06 DIAGNOSIS — Z85820 Personal history of malignant melanoma of skin: Secondary | ICD-10-CM | POA: Diagnosis not present

## 2021-05-06 DIAGNOSIS — Z1283 Encounter for screening for malignant neoplasm of skin: Secondary | ICD-10-CM | POA: Diagnosis not present

## 2021-05-12 NOTE — Telephone Encounter (Signed)
Error

## 2021-06-01 ENCOUNTER — Ambulatory Visit: Payer: Medicare HMO | Admitting: Family Medicine

## 2021-06-08 ENCOUNTER — Other Ambulatory Visit: Payer: Self-pay | Admitting: Family Medicine

## 2021-06-08 NOTE — Telephone Encounter (Signed)
Lvm to call to schedule

## 2021-06-12 ENCOUNTER — Telehealth (INDEPENDENT_AMBULATORY_CARE_PROVIDER_SITE_OTHER): Payer: Medicare HMO | Admitting: Family

## 2021-06-12 VITALS — BP 130/75 | HR 80 | Wt 160.0 lb

## 2021-06-12 DIAGNOSIS — E559 Vitamin D deficiency, unspecified: Secondary | ICD-10-CM | POA: Diagnosis not present

## 2021-06-12 DIAGNOSIS — Z7185 Encounter for immunization safety counseling: Secondary | ICD-10-CM

## 2021-06-12 DIAGNOSIS — F418 Other specified anxiety disorders: Secondary | ICD-10-CM

## 2021-06-12 DIAGNOSIS — R609 Edema, unspecified: Secondary | ICD-10-CM

## 2021-06-12 DIAGNOSIS — E785 Hyperlipidemia, unspecified: Secondary | ICD-10-CM

## 2021-06-12 DIAGNOSIS — R69 Illness, unspecified: Secondary | ICD-10-CM | POA: Diagnosis not present

## 2021-06-12 DIAGNOSIS — E039 Hypothyroidism, unspecified: Secondary | ICD-10-CM

## 2021-06-12 MED ORDER — LEVOTHYROXINE SODIUM 75 MCG PO TABS: 75.0000 ug | ORAL_TABLET | Freq: Every day | ORAL | 0 refills | Status: DC

## 2021-06-12 MED ORDER — HYDROXYZINE HCL 10 MG PO TABS: 10.0000 mg | ORAL_TABLET | Freq: Three times a day (TID) | ORAL | 1 refills | Status: DC | PRN

## 2021-06-12 MED ORDER — BUPROPION HCL ER (XL) 150 MG PO TB24: 150.0000 mg | ORAL_TABLET | Freq: Every day | ORAL | 1 refills | Status: DC

## 2021-06-12 MED ORDER — FUROSEMIDE 40 MG PO TABS: 40.0000 mg | ORAL_TABLET | Freq: Every day | ORAL | 0 refills | Status: DC | PRN

## 2021-06-12 MED ORDER — DICYCLOMINE HCL 20 MG PO TABS: 20.0000 mg | ORAL_TABLET | Freq: Two times a day (BID) | ORAL | 5 refills | Status: DC

## 2021-06-12 NOTE — Patient Instructions (Addendum)
Please visit the lab at your earliest convenience to have updated blood work done.  Please follow up with Dr. Randel Pigg in the next 3-4 months to have updated vitals performed.

## 2021-06-12 NOTE — Assessment & Plan Note (Signed)
Reports stable with prn lasix- using about 1x a month.

## 2021-06-12 NOTE — Assessment & Plan Note (Signed)
Patient reports that her mood is stable on current dose of wellbutrin. I noted after her visit that her cost for a 90 day supply at her local pharmacy would be $141, but would only be $34 at [facility]. I tried to reach pt on cell/home phone no answer. I went ahead and sent this rx to [facility] assuming she would prefer the cheaper option.
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
## 2021-06-12 NOTE — Assessment & Plan Note (Signed)
Clinically stable on current dose of synthroid. Continue same.

## 2021-06-12 NOTE — Assessment & Plan Note (Signed)
She is not vaccinated against covid or flu. I strongly recommended that pt consider getting these vaccines at her local pharmacy.

## 2021-06-12 NOTE — Assessment & Plan Note (Signed)
Continue otc supplement.  Check follow up level.

## 2021-06-15 DIAGNOSIS — L84 Corns and callosities: Secondary | ICD-10-CM | POA: Diagnosis not present

## 2021-06-15 DIAGNOSIS — M79672 Pain in left foot: Secondary | ICD-10-CM | POA: Diagnosis not present

## 2021-06-15 DIAGNOSIS — S92535A Nondisplaced fracture of distal phalanx of left lesser toe(s), initial encounter for closed fracture: Secondary | ICD-10-CM | POA: Diagnosis not present

## 2021-06-24 ENCOUNTER — Other Ambulatory Visit: Payer: Self-pay

## 2021-06-24 ENCOUNTER — Telehealth: Payer: Self-pay | Admitting: Family Medicine

## 2021-06-24 MED ORDER — BUPROPION HCL ER (XL) 150 MG PO TB24: 150.0000 mg | ORAL_TABLET | Freq: Every day | ORAL | 1 refills | Status: DC

## 2021-06-24 NOTE — Telephone Encounter (Signed)
Medication sent.

## 2021-06-24 NOTE — Telephone Encounter (Signed)
Pt stated pharmacy never received rx request. Rx was sent to wrong pharmacy.  Medication:  buPROPion (WELLBUTRIN XL) 150 MG 24 hr tablet  Has the patient contacted their pharmacy? No. (If no, request that the patient contact the pharmacy for the refill.) (If yes, when and what did the pharmacy advise?)  Preferred Pharmacy (with phone number or street name): [facility] [id number] [place] [address]  Phone:  [phone]  Fax:  [phone]   Agent: Please be advised that RX refills may take up to 3 business days. We ask that you follow-up with your pharmacy.

## 2021-07-09 ENCOUNTER — Other Ambulatory Visit: Payer: Self-pay | Admitting: Family Medicine

## 2021-07-09 DIAGNOSIS — M1811 Unilateral primary osteoarthritis of first carpometacarpal joint, right hand: Secondary | ICD-10-CM | POA: Diagnosis not present

## 2021-07-09 DIAGNOSIS — M1711 Unilateral primary osteoarthritis, right knee: Secondary | ICD-10-CM | POA: Diagnosis not present

## 2021-08-06 ENCOUNTER — Telehealth: Payer: Self-pay | Admitting: Family Medicine

## 2021-08-06 ENCOUNTER — Other Ambulatory Visit: Payer: Self-pay | Admitting: Family Medicine

## 2021-08-06 MED ORDER — ALPRAZOLAM 0.25 MG PO TABS: 0.2500 mg | ORAL_TABLET | Freq: Two times a day (BID) | ORAL | 1 refills | Status: DC | PRN

## 2021-08-06 NOTE — Telephone Encounter (Signed)
Patient states she had been taking care of sister right up until she passed, due to this she has been having trouble sleeping and trouble with her nerves. She would like to know if Dr. Charlett Blake would be able to send her something to help her sleep and calm her nerves. She states it's just temporary. Please advise

## 2021-08-10 NOTE — Telephone Encounter (Signed)
done

## 2021-08-19 DIAGNOSIS — C439 Malignant melanoma of skin, unspecified: Secondary | ICD-10-CM | POA: Diagnosis not present
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
## 2021-08-20 ENCOUNTER — Encounter: Payer: Self-pay | Admitting: Cardiology

## 2021-08-20 ENCOUNTER — Ambulatory Visit: Payer: Medicare Other | Admitting: Cardiology

## 2021-08-20 ENCOUNTER — Other Ambulatory Visit: Payer: Self-pay

## 2021-08-20 VITALS — BP 140/84 | HR 75 | Ht 65.0 in | Wt 166.2 lb

## 2021-08-20 DIAGNOSIS — Q249 Congenital malformation of heart, unspecified: Secondary | ICD-10-CM | POA: Diagnosis not present

## 2021-08-20 DIAGNOSIS — I420 Dilated cardiomyopathy: Secondary | ICD-10-CM | POA: Diagnosis not present

## 2021-08-20 DIAGNOSIS — I2510 Atherosclerotic heart disease of native coronary artery without angina pectoris: Secondary | ICD-10-CM | POA: Diagnosis not present

## 2021-08-20 NOTE — Patient Instructions (Signed)
Medication Instructions:  Your physician recommends that you continue on your current medications as directed. Please refer to the Current Medication list given to you today.  *If you need a refill on your cardiac medications before your next appointment, please call your pharmacy*  Lab Work: NONE ordered at this time of appointment   If you have labs (blood work) drawn today and your tests are completely normal, you will receive your results only by: [place] (if you have MyChart) OR A paper copy in the mail If you have any lab test that is abnormal or we need to change your treatment, we will call you to review the results.  Testing/Procedures: Your physician has requested that you have an echocardiogram. Echocardiography is a painless test that uses sound waves to create images of your heart. It provides your doctor with information about the size and shape of your heart and how well your hearts chambers and valves are working. This procedure takes approximately one hour. There are no restrictions for this procedure.  Please schedule for July 2023  Follow-Up: At [facility], you and your health needs are our priority.  As part of our continuing mission to provide you with exceptional heart care, we have created designated Provider Care Teams.  These Care Teams include your primary Cardiologist (physician) and Advanced Practice Providers (APPs -  Physician Assistants and Nurse Practitioners) who all work together to provide you with the care you need, when you need it.  Your next appointment:   1 year(s)  The format for your next appointment:   In Person  Provider:   Minus Breeding, MD    Other Instructions

## 2021-08-21 MED ORDER — TRAZODONE HCL 50 MG PO TABS: 25.0000 mg | ORAL_TABLET | Freq: Every evening | ORAL | 3 refills | Status: DC | PRN

## 2021-08-21 NOTE — Telephone Encounter (Signed)
Patient states the xanax is not helping her and after speaking to her cardiologist yesterday, he advised her to reach out to Dr. Charlett Blake to see if [name] can be given to her. She states she's been going to counseling and taking all the steps to help herself but she is still struggling. Please advise.

## 2021-08-21 NOTE — Addendum Note (Signed)
Addended by: Ames Coupe on: 08/21/2021 12:01 PM   Modules accepted: Orders

## 2021-08-21 NOTE — Telephone Encounter (Signed)
Trazodone sent. Ty.

## 2021-09-06 ENCOUNTER — Other Ambulatory Visit: Payer: Self-pay | Admitting: Family Medicine

## 2021-09-11 ENCOUNTER — Other Ambulatory Visit: Payer: Self-pay | Admitting: Family
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
## 2021-09-15 ENCOUNTER — Encounter: Payer: Self-pay | Admitting: Family Medicine

## 2021-09-15 ENCOUNTER — Other Ambulatory Visit: Payer: Self-pay

## 2021-09-15 ENCOUNTER — Telehealth (INDEPENDENT_AMBULATORY_CARE_PROVIDER_SITE_OTHER): Payer: Medicare Other | Admitting: Family Medicine

## 2021-09-15 VITALS — BP 127/74 | HR 74 | Temp 98.0°F | Wt 161.0 lb

## 2021-09-15 DIAGNOSIS — E039 Hypothyroidism, unspecified: Secondary | ICD-10-CM

## 2021-09-15 DIAGNOSIS — E559 Vitamin D deficiency, unspecified: Secondary | ICD-10-CM

## 2021-09-15 DIAGNOSIS — E785 Hyperlipidemia, unspecified: Secondary | ICD-10-CM | POA: Diagnosis not present

## 2021-09-15 DIAGNOSIS — F418 Other specified anxiety disorders: Secondary | ICD-10-CM | POA: Diagnosis not present

## 2021-09-15 DIAGNOSIS — R739 Hyperglycemia, unspecified: Secondary | ICD-10-CM

## 2021-09-15 DIAGNOSIS — G4700 Insomnia, unspecified: Secondary | ICD-10-CM

## 2021-09-15 MED ORDER — ALPRAZOLAM 0.25 MG PO TABS: 0.2500 mg | ORAL_TABLET | Freq: Two times a day (BID) | ORAL | 2 refills | Status: DC | PRN

## 2021-09-15 NOTE — Assessment & Plan Note (Addendum)
Encouraged good sleep hygiene such as dark, quiet room. No blue/green glowing lights such as computer screens in bedroom. No alcohol or stimulants in evening. Cut down on caffeine as able. Regular exercise is helpful but not just prior to bed time. Has tried Trazodone 25 mg and no concerning side effects but still not fully effective will increase to 50 mg qhs.  

## 2021-09-15 NOTE — Assessment & Plan Note (Signed)
Supplement and monitor 

## 2021-09-15 NOTE — Assessment & Plan Note (Signed)
Was the care giver for her sister when she passed away recently. Patient is struggling with her sadness and grief.  ?

## 2021-09-15 NOTE — Patient Instructions (Signed)

## 2021-09-15 NOTE — Assessment & Plan Note (Signed)
Encourage heart healthy diet such as MIND or DASH diet, increase exercise, avoid trans fats, simple carbohydrates and processed foods, consider a krill or fish or flaxseed oil cap daily.  °

## 2021-09-15 NOTE — Assessment & Plan Note (Signed)
hgba1c acceptable, minimize simple carbs. Increase exercise as tolerated.  

## 2021-09-15 NOTE — Assessment & Plan Note (Signed)
On Levothyroxine, continue to monitor 

## 2021-10-01 ENCOUNTER — Encounter: Payer: Self-pay | Admitting: Emergency Medicine

## 2021-10-01 ENCOUNTER — Ambulatory Visit: Payer: Medicare Other | Admitting: Emergency Medicine

## 2021-10-01 DIAGNOSIS — J4530 Mild persistent asthma, uncomplicated: Secondary | ICD-10-CM

## 2021-10-01 NOTE — Assessment & Plan Note (Signed)
She is grieving after the loss of her sister, is having anxiety, significant insomnia.  She is working with Dr. Charlett Blake to find a regimen that will help with the symptoms.  To date has tried low-dose Xanax, low-dose trazodone, for short period of time in combination.  Given her reassuring pulmonary function testing from 12/2019, absence of any obstructive symptoms, I do not believe she is at high risk for respiratory suppression unless she misuses the medications.  As long she carefully works with Dr. Charlett Blake and follows appropriate dosing I think there is very little chance for respiratory decompensation.  I reassured her about this today. ?We will hold off on bronchodilator therapy for now ?Follow in 1 year or as needed ?

## 2021-10-01 NOTE — Patient Instructions (Signed)
We reviewed your medications today.  Given your good lung function, absence of any asthma symptoms, I believe that you can titrate your medicines carefully under the direction of Dr. Charlett Blake without significant risk for respiratory suppression.  You would likely feel other side effects such as grogginess, morning sleepiness, before you would be at significant risk for dangerous respiratory suppression. ?We will not start any inhaled medications at this time. ?Please call if you develop any new breathing symptoms or respiratory problems. ?Follow Dr. Lamonte Sakai in 1 year or sooner if needed ?

## 2021-10-01 NOTE — Progress Notes (Signed)
? ?  Subjective:  ? ? Patient ID: [name], female    DOB: October 01, 1955, 66 y.o.   MRN: [id number] ? ?HPI ? ?ROV 10/01/21 --66 year old woman who presents today to reestablish care.  I last saw her in October 2019.  She has a history of breast cancer with an autologous stem cell transplant that was complicated by pneumonitis from chemotherapy, nonischemic cardiomyopathy.  She also has a history of mild intermittent asthma, chronic rhinitis. ?She reports today that her sister died recently and she has had difficulty with this, poor sleep, some appropriate anxiety. She was given low dose xanax at bedtime, and then trazadone was added. She is concerned about respiratory suppression, but notes that she has not had any side effects on current low doses.  ?She is not on any BD's currently. She is not having wheeze, cough or SOB.  ? ?Pulmonary function testing 01/17/2020 reviewed by me showed normal airflows, normal lung volumes, no bronchodilator response, normal diffusion capacity.  Normal flow volume loop. ? ?   ?Objective:  ? Physical Exam ?Vitals:  ? 10/01/21 1543  ?BP: 118/74  ?Pulse: 74  ?Temp: 98.6 ?F (37 ?C)  ?TempSrc: Oral  ?SpO2: 98%  ?Weight: 163 lb (73.9 kg)  ?Height: '5\' 5"'$  (1.651 m)  ? ? ?Gen: Pleasant,well-nourished, in no distress,  normal affect ? ?ENT: No lesions,  mouth clear,  oropharynx clear, no postnasal drip ? ?Neck: No JVD, no stridor ? ?Lungs: No use of accessory muscles, clear without rales or rhonchi ? ?Cardiovascular: RRR, heart sounds normal, no murmur or gallops, no peripheral edema ? ?Musculoskeletal: No deformities, no cyanosis or clubbing ? ?Neuro: alert, non focal ? ?Skin: Warm, no lesions or rashes ? ? ?   ?Assessment & Plan:  ?Asthma ?She is grieving after the loss of her sister, is having anxiety, significant insomnia.  She is working with Dr. Charlett Blake to find a regimen that will help with the symptoms.  To date has tried low-dose Xanax, low-dose trazodone, for short period of time in  combination.  Given her reassuring pulmonary function testing from 12/2019, absence of any obstructive symptoms, I do not believe she is at high risk for respiratory suppression unless she misuses the medications.  As long she carefully works with Dr. Charlett Blake and follows appropriate dosing I think there is very little chance for respiratory decompensation.  I reassured her about this today. ?We will hold off on bronchodilator therapy for now ?Follow in 1 year or as needed ? ?Time spent 41 minutes ? ?Baltazar Apo, MD, PhD ?10/01/2021, 4:09 PM ?[facility] ?[phone] or if no answer [phone] ? ?

## 2021-10-05 DIAGNOSIS — M1711 Unilateral primary osteoarthritis, right knee: Secondary | ICD-10-CM | POA: Diagnosis not present

## 2021-10-20 DIAGNOSIS — H524 Presbyopia: Secondary | ICD-10-CM | POA: Diagnosis not present

## 2021-11-04 DIAGNOSIS — Z08 Encounter for follow-up examination after completed treatment for malignant neoplasm: Secondary | ICD-10-CM | POA: Diagnosis not present

## 2021-11-04 DIAGNOSIS — Z85820 Personal history of malignant melanoma of skin: Secondary | ICD-10-CM | POA: Diagnosis not present

## 2021-11-04 DIAGNOSIS — L218 Other seborrheic dermatitis: Secondary | ICD-10-CM | POA: Diagnosis not present

## 2021-11-04 DIAGNOSIS — Z1283 Encounter for screening for malignant neoplasm of skin: Secondary | ICD-10-CM | POA: Diagnosis not present

## 2021-11-18 ENCOUNTER — Other Ambulatory Visit: Payer: Self-pay | Admitting: Family Medicine

## 2021-11-25 ENCOUNTER — Other Ambulatory Visit: Payer: Self-pay | Admitting: Family Medicine

## 2021-12-09 ENCOUNTER — Telehealth: Payer: Self-pay | Admitting: Hematology and Oncology

## 2021-12-09 NOTE — Telephone Encounter (Signed)
Rescheduled appointment per providers template. Left message.  ? ?

## 2021-12-10 ENCOUNTER — Other Ambulatory Visit: Payer: Self-pay | Admitting: Family Medicine

## 2021-12-10 NOTE — Telephone Encounter (Signed)
Requesting: alprazolam 0.'25mg'$   Contract: None UDS: None Last Visit: 09/15/21 Next Visit: 01/11/22 Last Refill: 09/15/2021 #40 and 2RF  Please Advise

## 2021-12-14 ENCOUNTER — Ambulatory Visit: Payer: Medicare HMO | Admitting: Family Medicine

## 2021-12-21 ENCOUNTER — Other Ambulatory Visit: Payer: Self-pay | Admitting: Family Medicine

## 2021-12-22 DIAGNOSIS — M533 Sacrococcygeal disorders, not elsewhere classified: Secondary | ICD-10-CM | POA: Diagnosis not present

## 2021-12-22 DIAGNOSIS — M5136 Other intervertebral disc degeneration, lumbar region: Secondary | ICD-10-CM | POA: Diagnosis not present

## 2021-12-23 ENCOUNTER — Other Ambulatory Visit: Payer: Self-pay | Admitting: Family Medicine

## 2021-12-23 DIAGNOSIS — Z1231 Encounter for screening mammogram for malignant neoplasm of breast: Secondary | ICD-10-CM

## 2021-12-31 ENCOUNTER — Ambulatory Visit (HOSPITAL_COMMUNITY): Payer: Medicare Other | Attending: Cardiology

## 2021-12-31 DIAGNOSIS — I420 Dilated cardiomyopathy: Secondary | ICD-10-CM | POA: Insufficient documentation

## 2021-12-31 LAB — ECHOCARDIOGRAM COMPLETE
Area-P 1/2: 7.16 cm2
S' Lateral: 3.1 cm

## 2022-01-08 NOTE — Addendum Note (Signed)
Addended by: Lynnea Ferrier R on: 01/08/2022 02:39 PM   Modules accepted: Orders
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
## 2022-01-11 ENCOUNTER — Encounter: Payer: Self-pay | Admitting: Family Medicine

## 2022-01-11 ENCOUNTER — Ambulatory Visit (INDEPENDENT_AMBULATORY_CARE_PROVIDER_SITE_OTHER): Payer: Medicare Other | Admitting: Family Medicine

## 2022-01-11 VITALS — BP 160/90 | Resp 20 | Ht 65.0 in | Wt 162.6 lb

## 2022-01-11 DIAGNOSIS — E039 Hypothyroidism, unspecified: Secondary | ICD-10-CM | POA: Diagnosis not present

## 2022-01-11 DIAGNOSIS — M1711 Unilateral primary osteoarthritis, right knee: Secondary | ICD-10-CM | POA: Diagnosis not present

## 2022-01-11 DIAGNOSIS — R739 Hyperglycemia, unspecified: Secondary | ICD-10-CM | POA: Diagnosis not present

## 2022-01-11 DIAGNOSIS — M25551 Pain in right hip: Secondary | ICD-10-CM

## 2022-01-11 DIAGNOSIS — M1811 Unilateral primary osteoarthritis of first carpometacarpal joint, right hand: Secondary | ICD-10-CM | POA: Diagnosis not present

## 2022-01-11 DIAGNOSIS — K5900 Constipation, unspecified: Secondary | ICD-10-CM

## 2022-01-11 DIAGNOSIS — J4530 Mild persistent asthma, uncomplicated: Secondary | ICD-10-CM

## 2022-01-11 DIAGNOSIS — M79644 Pain in right finger(s): Secondary | ICD-10-CM

## 2022-01-11 DIAGNOSIS — M25561 Pain in right knee: Secondary | ICD-10-CM

## 2022-01-11 DIAGNOSIS — E559 Vitamin D deficiency, unspecified: Secondary | ICD-10-CM

## 2022-01-11 DIAGNOSIS — Z79899 Other long term (current) drug therapy: Secondary | ICD-10-CM

## 2022-01-11 DIAGNOSIS — E785 Hyperlipidemia, unspecified: Secondary | ICD-10-CM

## 2022-01-11 DIAGNOSIS — N189 Chronic kidney disease, unspecified: Secondary | ICD-10-CM

## 2022-01-11 DIAGNOSIS — C439 Malignant melanoma of skin, unspecified: Secondary | ICD-10-CM | POA: Insufficient documentation

## 2022-01-11 LAB — CBC
HCT: 41.7 % (ref 36.0–46.0)
Hemoglobin: 13.8 g/dL (ref 12.0–15.0)
MCHC: 33.2 g/dL (ref 30.0–36.0)
MCV: 97.6 fl (ref 78.0–100.0)
Platelets: 234 10*3/uL (ref 150.0–400.0)
RBC: 4.28 Mil/uL (ref 3.87–5.11)
RDW: 13.2 % (ref 11.5–15.5)
WBC: 7.8 10*3/uL (ref 4.0–10.5)

## 2022-01-11 LAB — COMPREHENSIVE METABOLIC PANEL
ALT: 26 U/L (ref 0–35)
AST: 34 U/L (ref 0–37)
Albumin: 4.5 g/dL (ref 3.5–5.2)
Alkaline Phosphatase: 71 U/L (ref 39–117)
BUN: 19 mg/dL (ref 6–23)
CO2: 31 mEq/L (ref 19–32)
Calcium: 8.9 mg/dL (ref 8.4–10.5)
Chloride: 100 mEq/L (ref 96–112)
Creatinine, Ser: 0.91 mg/dL (ref 0.40–1.20)
GFR: 66.53 mL/min (ref >60.00)
Glucose, Bld: 95 mg/dL (ref 70–99)
Potassium: 3.9 mEq/L (ref 3.5–5.1)
Sodium: 139 mEq/L (ref 135–145)
Total Bilirubin: 0.6 mg/dL (ref 0.2–1.2)
Total Protein: 6.4 g/dL (ref 6.0–8.3)

## 2022-01-11 LAB — LIPID PANEL
Cholesterol: 180 mg/dL (ref 0–200)
HDL: 54.5 mg/dL (ref >39.00)
LDL Cholesterol: 113 mg/dL — ABNORMAL HIGH (ref 0–99)
NonHDL: 125.73
Total CHOL/HDL Ratio: 3
Triglycerides: 63 mg/dL (ref 0.0–149.0)
VLDL: 12.6 mg/dL (ref 0.0–40.0)

## 2022-01-11 LAB — HEMOGLOBIN A1C: Hgb A1c MFr Bld: 5.9 % (ref 4.6–6.5)

## 2022-01-11 LAB — TSH: TSH: 0.7 u[IU]/mL (ref 0.35–5.50)

## 2022-01-11 LAB — VITAMIN D 25 HYDROXY (VIT D DEFICIENCY, FRACTURES): VITD: 60.29 ng/mL (ref 30.00–100.00)

## 2022-01-11 NOTE — Assessment & Plan Note (Signed)
Doing very well, will return to Dr Lamonte Sakai at Pulmonary if symptoms worsen

## 2022-01-11 NOTE — Assessment & Plan Note (Signed)
Following with [name], he is seeing her today. They are considering a joint replacement

## 2022-01-11 NOTE — Patient Instructions (Addendum)
Qunol Turmeric/Curcumen this is a good brand CBD daily cream, extra strength, mint online  [employer] shoes  Encouraged increased hydration and fiber in diet. Daily probiotics. If bowels not moving can use Milk Of Magnesium 2 tbls po in 4 oz of warm prune juice by mouth every 2-3 days. If no results then repeat in 4 hours with  Dulcolax suppository pr, may repeat again in 4 more hours as needed. Seek care if symptoms worsen. Consider daily Miralax and/or Dulcolax if symptoms persist.    Miralax and Benefiber together once to twice daily  Arthritis Arthritis is a term that is commonly used to refer to joint pain or joint disease. There are more than 100 types of arthritis. What are the causes? The most common cause of this condition is wear and tear of a joint. Other causes include: Gout. Inflammation of a joint. An infection of a joint. Sprains and other injuries near the joint. A reaction to medicines or drugs, or an allergic reaction. In some cases, the cause may not be known. What are the signs or symptoms? The main symptom of this condition is pain in the joint during movement. Other symptoms include: Redness, swelling, or stiffness at a joint. Warmth coming from the joint. Fever. Overall feeling of illness. How is this diagnosed? This condition may be diagnosed with a physical exam and tests, including: Blood tests. Urine tests. Imaging tests, such as X-rays, an MRI, or a CT scan. Sometimes, fluid is removed from a joint for testing. How is this treated? This condition may be treated with: Treatment of the cause, if it is known. Rest. Raising (elevating) the joint. Applying cold or hot packs to the joint. Medicines to improve symptoms and reduce inflammation. Injections of a steroid, such as cortisone, into the joint to help reduce pain and inflammation. Depending on the cause of your arthritis, you may need to make lifestyle changes to reduce stress on your joint. Changes  may include: Exercising more. Losing weight. Follow these instructions at home: Medicines Take over-the-counter and prescription medicines only as told by your health care provider. Do not take aspirin to relieve pain if your health care provider thinks that gout may be causing your pain. Activity Rest your joint if told by your health care provider. Rest is important when your disease is active and your joint feels painful, swollen, or stiff. Avoid activities that make the pain worse. Balance activity with rest. Exercise your joint regularly with range-of-motion exercises as told by your health care provider. Try doing low-impact exercise, such as: Swimming. Water aerobics. Biking. Walking. Managing pain, stiffness, and swelling     If directed, put ice on the affected joint. To do this: Put ice in a plastic bag. Place a towel between your skin and the bag. Leave the ice on for 20 minutes, 2-3 times a day. Remove the ice if your skin turns bright red. This is very important. If you cannot feel pain, heat, or cold, you have a greater risk of damage to the area. If your joint is swollen, raise (elevate) it above the level of your heart if directed by your health care provider. If your joint feels stiff in the morning, try taking a warm shower. If directed, apply heat to the affected area as often as told by your health care provider. Use the heat source that your health care provider recommends, such as a moist heat pack or a heating pad. To apply heat: Place a towel between your skin  and the heat source. Leave the heat on for 20-30 minutes. Remove the heat if your skin turns bright red. This is especially important if you are unable to feel pain, heat, or cold. You have a greater risk of getting burned. General instructions Maintain a healthy weight. Follow instructions from your health care provider for weight control. Do not use any products that contain nicotine or tobacco. These  products include cigarettes, chewing tobacco, and vaping devices, such as e-cigarettes. If you need help quitting, ask your health care provider. Keep all follow-up visits. This is important. Where to find more information [employer] of [facility]: www.niams.SouthEx[employer].es Contact a health care provider if: The pain gets worse. You have a fever. Get help right away if: You develop severe joint pain, swelling, or redness. Many joints become painful and swollen. You develop severe back pain. You develop severe weakness in your leg. Summary Arthritis is a term that is commonly used to refer to joint pain or joint disease. There are more than 100 types of arthritis. The most common cause of this condition is wear and tear of a joint. Other causes include gout, inflammation or infection of the joint, sprains, or allergies. Symptoms of this condition include redness, swelling, or stiffness of the joint. Other symptoms include warmth, fever, or feeling ill. This condition is treated with rest, elevation, medicines, and applying cold or hot packs. Follow your health care provider's instructions about medicines, activity, exercises, and other home care treatments. This information is not intended to replace advice given to you by your health care provider. Make sure you discuss any questions you have with your health care provider. Document Revised: 03/24/2021 Document Reviewed: 03/24/2021 Elsevier Patient Education  [place].

## 2022-01-11 NOTE — Assessment & Plan Note (Signed)
Her hip pain is now as bad as prior to her surgery. Had surgery with Dr Alvan Dame. Is ready to consider a second opinion

## 2022-01-11 NOTE — Assessment & Plan Note (Signed)
hgba1c acceptable, minimize simple carbs. Increase exercise as tolerated.  

## 2022-01-11 NOTE — Assessment & Plan Note (Signed)
Hydrate and monitor 

## 2022-01-11 NOTE — Assessment & Plan Note (Signed)
Is seeing Dr Theda Sers today regarding knee

## 2022-01-11 NOTE — Assessment & Plan Note (Signed)
Encourage heart healthy diet such as MIND or DASH diet, increase exercise, avoid trans fats, simple carbohydrates and processed foods, consider a krill or fish or flaxseed oil cap daily.  °

## 2022-01-11 NOTE — Assessment & Plan Note (Signed)
Supplement and monitor 

## 2022-01-11 NOTE — Assessment & Plan Note (Addendum)
On right anterior chest wall. Surgery at [facility], surgeon, Dr Freddi Che, August 2nd 2019

## 2022-01-11 NOTE — Assessment & Plan Note (Signed)
On Levothyroxine, continue to monitor 

## 2022-01-11 NOTE — Assessment & Plan Note (Signed)
Encouraged increased hydration and fiber in diet. Daily probiotics. If bowels not moving can use MOM 2 tbls po in 4 oz of warm prune juice by mouth every 2-3 days. If no results then repeat in 4 hours with  Dulcolax suppository pr, may repeat again in 4 more hours as needed. Seek care if symptoms worsen. Consider daily Miralax and/or Dulcolax if symptoms persist.  Miralax and Benefiber together once or twice a day

## 2022-01-18 DIAGNOSIS — M1711 Unilateral primary osteoarthritis, right knee: Secondary | ICD-10-CM | POA: Diagnosis not present

## 2022-01-18 DIAGNOSIS — M25551 Pain in right hip: Secondary | ICD-10-CM | POA: Diagnosis not present

## 2022-02-05 ENCOUNTER — Ambulatory Visit
Admission: RE | Admit: 2022-02-05 | Discharge: 2022-02-05 | Disposition: A | Discharge status: Home / Self Care | Payer: Medicare Other | Source: Ambulatory Visit | Attending: Family Medicine | Admitting: Family Medicine

## 2022-02-05 DIAGNOSIS — Z1231 Encounter for screening mammogram for malignant neoplasm of breast: Secondary | ICD-10-CM | POA: Diagnosis not present

## 2022-02-16 DIAGNOSIS — M25551 Pain in right hip: Secondary | ICD-10-CM | POA: Diagnosis not present

## 2022-02-16 DIAGNOSIS — Z9889 Other specified postprocedural states: Secondary | ICD-10-CM | POA: Diagnosis not present

## 2022-02-16 DIAGNOSIS — Z96641 Presence of right artificial hip joint: Secondary | ICD-10-CM | POA: Diagnosis not present

## 2022-02-16 DIAGNOSIS — M25851 Other specified joint disorders, right hip: Secondary | ICD-10-CM | POA: Diagnosis not present

## 2022-02-17 DIAGNOSIS — C4359 Malignant melanoma of other part of trunk: Secondary | ICD-10-CM | POA: Diagnosis not present

## 2022-02-19 DIAGNOSIS — Z01419 Encounter for gynecological examination (general) (routine) without abnormal findings: Secondary | ICD-10-CM | POA: Diagnosis not present

## 2022-02-19 DIAGNOSIS — Z01411 Encounter for gynecological examination (general) (routine) with abnormal findings: Secondary | ICD-10-CM | POA: Diagnosis not present

## 2022-02-19 DIAGNOSIS — Z124 Encounter for screening for malignant neoplasm of cervix: Secondary | ICD-10-CM | POA: Diagnosis not present

## 2022-02-19 DIAGNOSIS — Z0142 Encounter for cervical smear to confirm findings of recent normal smear following initial abnormal smear: Secondary | ICD-10-CM | POA: Diagnosis not present

## 2022-02-22 ENCOUNTER — Other Ambulatory Visit: Payer: Self-pay | Admitting: Obstetrics & Gynecology

## 2022-02-22 DIAGNOSIS — Z853 Personal history of malignant neoplasm of breast: Secondary | ICD-10-CM

## 2022-02-27 ENCOUNTER — Other Ambulatory Visit: Payer: Self-pay | Admitting: Family Medicine

## 2022-03-03 ENCOUNTER — Other Ambulatory Visit: Payer: Self-pay

## 2022-03-03 MED ORDER — LEVOTHYROXINE SODIUM 75 MCG PO TABS: 75.0000 ug | ORAL_TABLET | Freq: Every day | ORAL | 1 refills | Status: DC

## 2022-03-10 ENCOUNTER — Other Ambulatory Visit: Payer: Medicare HMO

## 2022-03-10 ENCOUNTER — Ambulatory Visit: Payer: Medicare HMO | Admitting: Hematology and Oncology

## 2022-03-17 ENCOUNTER — Ambulatory Visit: Payer: Self-pay | Admitting: Hematology and Oncology

## 2022-03-17 ENCOUNTER — Other Ambulatory Visit: Payer: Self-pay

## 2022-03-30 ENCOUNTER — Ambulatory Visit (HOSPITAL_BASED_OUTPATIENT_CLINIC_OR_DEPARTMENT_OTHER)
Admission: RE | Admit: 2022-03-30 | Discharge: 2022-03-30 | Disposition: A | Discharge status: Home / Self Care | Payer: Medicare Other | Source: Ambulatory Visit | Attending: Family Medicine | Admitting: Family Medicine

## 2022-03-30 ENCOUNTER — Ambulatory Visit (INDEPENDENT_AMBULATORY_CARE_PROVIDER_SITE_OTHER): Payer: Medicare Other | Admitting: Family Medicine

## 2022-03-30 ENCOUNTER — Encounter: Payer: Self-pay | Admitting: Family Medicine

## 2022-03-30 VITALS — BP 138/78 | HR 94 | Temp 98.1°F | Resp 18 | Ht 65.0 in | Wt 160.2 lb

## 2022-03-30 DIAGNOSIS — R0602 Shortness of breath: Secondary | ICD-10-CM | POA: Diagnosis not present

## 2022-03-30 DIAGNOSIS — G90521 Complex regional pain syndrome I of right lower limb: Secondary | ICD-10-CM | POA: Diagnosis not present

## 2022-03-30 DIAGNOSIS — R053 Chronic cough: Secondary | ICD-10-CM

## 2022-03-30 DIAGNOSIS — Z96641 Presence of right artificial hip joint: Secondary | ICD-10-CM | POA: Diagnosis not present

## 2022-03-30 DIAGNOSIS — Z888 Allergy status to other drugs, medicaments and biological substances status: Secondary | ICD-10-CM | POA: Diagnosis not present

## 2022-03-30 DIAGNOSIS — R059 Cough, unspecified: Secondary | ICD-10-CM | POA: Diagnosis not present

## 2022-03-30 DIAGNOSIS — Z885 Allergy status to narcotic agent status: Secondary | ICD-10-CM | POA: Diagnosis not present

## 2022-03-30 DIAGNOSIS — M25551 Pain in right hip: Secondary | ICD-10-CM | POA: Diagnosis not present

## 2022-03-30 MED ORDER — AZITHROMYCIN 250 MG PO TABS: ORAL_TABLET | ORAL | 0 refills | Status: DC

## 2022-03-30 NOTE — Patient Instructions (Signed)
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
## 2022-04-04 ENCOUNTER — Encounter: Payer: Self-pay | Admitting: Family Medicine

## 2022-04-05 ENCOUNTER — Other Ambulatory Visit: Payer: Self-pay | Admitting: Family Medicine

## 2022-04-05 MED ORDER — AMOXICILLIN-POT CLAVULANATE 875-125 MG PO TABS: 1.0000 | ORAL_TABLET | Freq: Two times a day (BID) | ORAL | 0 refills | Status: DC

## 2022-04-05 MED ORDER — METHYLPREDNISOLONE 4 MG PO TABS: ORAL_TABLET | ORAL | 0 refills | Status: DC

## 2022-04-14 NOTE — Assessment & Plan Note (Signed)
Hydrate and monitor 

## 2022-04-14 NOTE — Assessment & Plan Note (Signed)
Encourage heart healthy diet such as MIND or DASH diet, increase exercise, avoid trans fats, simple carbohydrates and processed foods, consider a krill or fish or flaxseed oil cap daily.  °

## 2022-04-14 NOTE — Assessment & Plan Note (Signed)
Supplement and monitor 

## 2022-04-14 NOTE — Assessment & Plan Note (Addendum)
hgba1c acceptable, minimize simple carbs. Increase exercise as tolerated.  

## 2022-04-14 NOTE — Assessment & Plan Note (Signed)
On Levothyroxine, continue to monitor 

## 2022-04-15 ENCOUNTER — Ambulatory Visit (INDEPENDENT_AMBULATORY_CARE_PROVIDER_SITE_OTHER): Payer: Medicare Other | Admitting: Family Medicine

## 2022-04-15 VITALS — BP 136/80 | HR 84 | Temp 98.0°F | Resp 16 | Ht 65.0 in | Wt 157.8 lb

## 2022-04-15 DIAGNOSIS — R739 Hyperglycemia, unspecified: Secondary | ICD-10-CM

## 2022-04-15 DIAGNOSIS — E559 Vitamin D deficiency, unspecified: Secondary | ICD-10-CM

## 2022-04-15 DIAGNOSIS — E039 Hypothyroidism, unspecified: Secondary | ICD-10-CM

## 2022-04-15 DIAGNOSIS — J4530 Mild persistent asthma, uncomplicated: Secondary | ICD-10-CM

## 2022-04-15 DIAGNOSIS — G720 Drug-induced myopathy: Secondary | ICD-10-CM

## 2022-04-15 DIAGNOSIS — T466X5A Adverse effect of antihyperlipidemic and antiarteriosclerotic drugs, initial encounter: Secondary | ICD-10-CM

## 2022-04-15 DIAGNOSIS — R052 Subacute cough: Secondary | ICD-10-CM | POA: Diagnosis not present

## 2022-04-15 DIAGNOSIS — R6883 Chills (without fever): Secondary | ICD-10-CM

## 2022-04-15 DIAGNOSIS — N189 Chronic kidney disease, unspecified: Secondary | ICD-10-CM | POA: Diagnosis not present

## 2022-04-15 DIAGNOSIS — E785 Hyperlipidemia, unspecified: Secondary | ICD-10-CM

## 2022-04-15 LAB — COMPREHENSIVE METABOLIC PANEL
ALT: 18 U/L (ref 0–35)
AST: 24 U/L (ref 0–37)
Albumin: 4.3 g/dL (ref 3.5–5.2)
Alkaline Phosphatase: 75 U/L (ref 39–117)
BUN: 13 mg/dL (ref 6–23)
CO2: 29 mEq/L (ref 19–32)
Calcium: 9.2 mg/dL (ref 8.4–10.5)
Chloride: 102 mEq/L (ref 96–112)
Creatinine, Ser: 0.81 mg/dL (ref 0.40–1.20)
GFR: 76.37 mL/min (ref >60.00)
Glucose, Bld: 85 mg/dL (ref 70–99)
Potassium: 4.2 mEq/L (ref 3.5–5.1)
Sodium: 138 mEq/L (ref 135–145)
Total Bilirubin: 0.6 mg/dL (ref 0.2–1.2)
Total Protein: 6.5 g/dL (ref 6.0–8.3)

## 2022-04-15 LAB — CBC WITH DIFFERENTIAL/PLATELET
Basophils Absolute: 0.1 10*3/uL (ref 0.0–0.1)
Basophils Relative: 1 % (ref 0.0–3.0)
Eosinophils Absolute: 0.1 10*3/uL (ref 0.0–0.7)
Eosinophils Relative: 0.9 % (ref 0.0–5.0)
HCT: 44 % (ref 36.0–46.0)
Hemoglobin: 14.2 g/dL (ref 12.0–15.0)
Lymphocytes Relative: 29.2 % (ref 12.0–46.0)
Lymphs Abs: 2.3 10*3/uL (ref 0.7–4.0)
MCHC: 32.4 g/dL (ref 30.0–36.0)
MCV: 98.5 fl (ref 78.0–100.0)
Monocytes Absolute: 0.6 10*3/uL (ref 0.1–1.0)
Monocytes Relative: 7.7 % (ref 3.0–12.0)
Neutro Abs: 4.7 10*3/uL (ref 1.4–7.7)
Neutrophils Relative %: 61.2 % (ref 43.0–77.0)
Platelets: 241 10*3/uL (ref 150.0–400.0)
RBC: 4.46 Mil/uL (ref 3.87–5.11)
RDW: 13.1 % (ref 11.5–15.5)
WBC: 7.8 10*3/uL (ref 4.0–10.5)

## 2022-04-15 MED ORDER — METHYLPREDNISOLONE 4 MG PO TABS: ORAL_TABLET | ORAL | 0 refills | Status: DC

## 2022-04-15 NOTE — Patient Instructions (Signed)
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
## 2022-04-18 NOTE — Assessment & Plan Note (Signed)
Has a persistent cough with SOB and now chills. Referred to pulmonology and started on a course of Medrol. Has must finished an antibiotic and medrol but a shorter course. Encouraged increased rest and hydration, add probiotics, zinc such as Coldeze or Xicam. Treat fevers as needed and plain Mucinex

## 2022-04-18 NOTE — Assessment & Plan Note (Signed)
Does not tolerate statins.

## 2022-04-20 ENCOUNTER — Ambulatory Visit: Payer: Medicare Other | Admitting: Emergency Medicine

## 2022-04-20 ENCOUNTER — Encounter: Payer: Self-pay | Admitting: Emergency Medicine

## 2022-04-20 DIAGNOSIS — J301 Allergic rhinitis due to pollen: Secondary | ICD-10-CM | POA: Diagnosis not present

## 2022-04-20 DIAGNOSIS — J4530 Mild persistent asthma, uncomplicated: Secondary | ICD-10-CM

## 2022-04-20 MED ORDER — FLUTICASONE PROPIONATE 50 MCG/ACT NA SUSP: 2.0000 | Freq: Every day | NASAL | 2 refills | Status: DC

## 2022-04-20 MED ORDER — DOXYCYCLINE HYCLATE 100 MG PO TABS: 100.0000 mg | ORAL_TABLET | Freq: Two times a day (BID) | ORAL | 0 refills | Status: AC

## 2022-04-20 NOTE — Assessment & Plan Note (Addendum)
She is having cough, shortness of breath, intermittently wheeze that has been persistent despite Medrol, antibiotics.  Suspect this is being driven by chronic allergic rhinitis.  She has a new exposure over the last several months, confirmed mold exposure which may be driving refractory symptoms.  Discussed exposure avoidance with her.  We will try adding fluticasone nasal spray to loratadine.  She will finish the Medrol and a course of doxycycline.

## 2022-04-20 NOTE — Assessment & Plan Note (Signed)
Unclear how much of her symptomatology is actually asthma versus cough and active acute on chronic rhinitis.  She did not respond to Medrol as well as I would have expected if this was bronchospasm.  Her PFT in the past of been reassuring.  Some of her symptoms are more general such as fatigue, weakness, subjective fevers.  I do not see any evidence for an active fungal infection.  She had a reassuring chest x-ray 03/30/2022.  If we cannot improve things by more aggressively treating her rhinitis then she probably needs repeat pulmonary function testing to quantify any obstruction

## 2022-04-20 NOTE — Addendum Note (Signed)
Addended by: Loma Sousa on: 04/20/2022 01:43 PM   Modules accepted: Orders

## 2022-04-20 NOTE — Progress Notes (Signed)
Subjective:    Patient ID: [name], female    DOB: Apr 07, 1956, 66 y.o.   MRN: [id number]  HPI  ROV 10/01/21 --66 year old woman who presents today to reestablish care.  I last saw her in October 2019.  She has a history of breast cancer with an autologous stem cell transplant that was complicated by pneumonitis from chemotherapy, nonischemic cardiomyopathy.  She also has a history of mild intermittent asthma, chronic rhinitis. She reports today that her sister died recently and she has had difficulty with this, poor sleep, some appropriate anxiety. She was given low dose xanax at bedtime, and then trazadone was added. She is concerned about respiratory suppression, but notes that she has not had any side effects on current low doses.  She is not on any BD's currently. She is not having wheeze, cough or SOB.   Pulmonary function testing 01/17/2020 reviewed by me showed normal airflows, normal lung volumes, no bronchodilator response, normal diffusion capacity.  Normal flow volume loop.  Acute OV 04/20/22 --66 year old woman with a history of breast cancer treated by autologous stem cell transplant, complicated by pneumonitis from her chemotherapy and associated nonischemic cardiomyopathy.  I have followed her for chronic rhinitis, mild intermittent asthma.  Not currently on any BD therapy.  Has been managed on loratadine She has been experiencing increased SOB, clear allergy sx and drainage, some cough beginning in August. CXR 03/30/22 was clear. Has received azithro, then a course of augmentin. Treated with steroids and now again she received a Medrol Dosepak 04/15/2022 prescribed by Dr. Charlett Blake. Her cough seems to have benefited some from steroids. She still notes weakness, chills, subjective fevers. She has been exposed to mold in family members' home since July, recently discovered. ? Whether this has been driving sx.      Objective:   Physical Exam Vitals:   04/20/22 1311  BP: 134/78   Pulse: 79  Temp: 98.2 F (36.8 C)  SpO2: 100%  Weight: 159 lb (72.1 kg)  Height: '5\' 5"'$  (1.651 m)     Gen: Pleasant,well-nourished, in no distress,  normal affect  ENT: No lesions,  mouth clear,  oropharynx clear, no postnasal drip  Neck: No JVD, no stridor  Lungs: No use of accessory muscles, clear without rales or rhonchi  Cardiovascular: RRR, heart sounds normal, no murmur or gallops, no peripheral edema  Musculoskeletal: No deformities, no cyanosis or clubbing  Neuro: alert, non focal  Skin: Warm, no lesions or rashes      Assessment & Plan:  Allergic rhinitis She is having cough, shortness of breath, intermittently wheeze that has been persistent despite Medrol, antibiotics.  Suspect this is being driven by chronic allergic rhinitis.  She has a new exposure over the last several months, confirmed mold exposure which may be driving refractory symptoms.  Discussed exposure avoidance with her.  We will try adding fluticasone nasal spray to loratadine.  She will finish the Medrol and a course of doxycycline.  Asthma Unclear how much of her symptomatology is actually asthma versus cough and active acute on chronic rhinitis.  She did not respond to Medrol as well as I would have expected if this was bronchospasm.  Her PFT in the past of been reassuring.  Some of her symptoms are more general such as fatigue, weakness, subjective fevers.  I do not see any evidence for an active fungal infection.  She had a reassuring chest x-ray 03/30/2022.  If we cannot improve things by more aggressively treating her  rhinitis then she probably needs repeat pulmonary function testing to quantify any obstruction  Time spent 33 minutes  Baltazar Apo, MD, PhD 04/20/2022, 1:36 PM [facility] [phone] or if no answer [phone]

## 2022-04-20 NOTE — Patient Instructions (Signed)
Agree with completing Medrol Dosepak as ordered Please take doxycycline 100 mg twice a day for 7 days until completely gone. Continue loratadine 10 mg once daily. Try starting fluticasone nasal spray, 2 sprays each nostril once daily. Follow with Dr. Lamonte Sakai or APP in about 6 to 8 weeks.  At that time if you continue to have symptoms we may decide to perform a more formal Allergy work-up.  We may also decide to repeat your pulmonary function testing at that time.

## 2022-04-22 ENCOUNTER — Other Ambulatory Visit: Payer: Self-pay

## 2022-04-22 ENCOUNTER — Telehealth: Payer: Self-pay

## 2022-04-22 MED ORDER — METHYLPREDNISOLONE 4 MG PO TABS: ORAL_TABLET | ORAL | 0 refills | Status: DC

## 2022-04-22 NOTE — Telephone Encounter (Signed)
Received call from Kristopher Oppenheim at [facility]- they received a prescription for methylprednisolone '4mg'$  for 53 tablets- based on directions by Dr. Charlett Blake quantity was short 10 tablets. They request that we just send a new prescription for 10 tablets because the Pt has already picked up the 53 tablet quantity. Rx sent.

## 2022-04-29 ENCOUNTER — Telehealth: Payer: Self-pay | Admitting: Emergency Medicine

## 2022-04-29 NOTE — Telephone Encounter (Signed)
ATC x1.  No answer.  LVM to return call.

## 2022-04-30 ENCOUNTER — Ambulatory Visit (INDEPENDENT_AMBULATORY_CARE_PROVIDER_SITE_OTHER): Payer: Medicare Other

## 2022-04-30 ENCOUNTER — Encounter: Payer: Self-pay | Admitting: Nurse Practitioner

## 2022-04-30 ENCOUNTER — Ambulatory Visit: Payer: Medicare Other | Admitting: Nurse Practitioner

## 2022-04-30 VITALS — BP 112/72 | HR 96 | Ht 65.0 in | Wt 157.0 lb

## 2022-04-30 DIAGNOSIS — E039 Hypothyroidism, unspecified: Secondary | ICD-10-CM | POA: Diagnosis not present

## 2022-04-30 DIAGNOSIS — I429 Cardiomyopathy, unspecified: Secondary | ICD-10-CM | POA: Diagnosis not present

## 2022-04-30 DIAGNOSIS — R0602 Shortness of breath: Secondary | ICD-10-CM

## 2022-04-30 DIAGNOSIS — R059 Cough, unspecified: Secondary | ICD-10-CM | POA: Diagnosis not present

## 2022-04-30 DIAGNOSIS — R0609 Other forms of dyspnea: Secondary | ICD-10-CM

## 2022-04-30 DIAGNOSIS — J4530 Mild persistent asthma, uncomplicated: Secondary | ICD-10-CM | POA: Diagnosis not present

## 2022-04-30 DIAGNOSIS — J329 Chronic sinusitis, unspecified: Secondary | ICD-10-CM | POA: Insufficient documentation

## 2022-04-30 LAB — POCT EXHALED NITRIC OXIDE: FeNO level (ppb): 13

## 2022-04-30 MED ORDER — ALBUTEROL SULFATE HFA 108 (90 BASE) MCG/ACT IN AERS: 2.0000 | INHALATION_SPRAY | Freq: Four times a day (QID) | RESPIRATORY_TRACT | 2 refills | Status: DC | PRN

## 2022-04-30 MED ORDER — AZELASTINE HCL 0.1 % NA SOLN: 2.0000 | Freq: Two times a day (BID) | NASAL | 2 refills | Status: DC

## 2022-04-30 NOTE — Telephone Encounter (Signed)
Finished steroids and antibiotics and still feeling terrible, since she has heart issues related to chemo from years ago. Seeing you at 4p today

## 2022-04-30 NOTE — Assessment & Plan Note (Signed)
Persistent dyspnea upon exertion, fatigue and generalized weakness over the last 2 months.  Symptoms initially started beginning of September.  Unable to identify triggering factor but possibly URI with worsening nasal symptoms.  Her work-up thus far has been relatively unremarkable.  We obtained office spirometry today which showed moderate mixed obstruction and restriction; decline when compared to PFTs 2 years ago. FeNO was normal though so not convinced this is related to asthma.  Provided her with albuterol inhaler to use as needed.  She does have a significant cardiac history.  Recommended that we repeat echocardiogram and obtain BNP/bmet for further evaluation.  CXR was normal today.  No infectious source is identified.  We will recheck CBC to ensure that her white count is stable as well as rule underlying anemia.  She also has a history of hypothyroidism, discussed that her symptoms could be related to this.  We will recheck TSH today. Given her spirometry and symptoms, recommended that we check CT chest for further evaluation.  We will also obtain CT sinus given her persistent sinus symptoms.  Advised that chronic sinusitis can cause dyspnea with exertion.  Provided with strict return precautions.  Patient Instructions  Continue flonase nasal spray 2 sprays each nostril daily Continue loratidine (claritin) 1 tab daily for allergies/sinus symptoms  Saline nasal irrigation 1-2 times a day Astelin nasal spray 2 sprays each nostril Twice daily for nasal congestion Mucinex (guaifenesin) 600 mg Twice daily for congestion Albuterol inhaler 2 puffs every 6 hours as needed for shortness of breath or wheezing. Notify if symptoms persist despite rescue inhaler/neb use.   Complete steroids as previously prescribed  Chest x ray was clear today.  You had a mixed obstructive and restrictive defect on your breathing test today. We will order formal pulmonary function testing for further evaluation  CT  chest and CT sinus - someone will contact you for scheduling  Echocardiogram - someone will contact you for scheduling  Labs were completed today to look at potential causes for your symptoms  Follow up in 3-4 weeks to discuss above findings with Dr. Lamonte Sakai or Alanson Aly. If symptoms do not improve or worsen, please contact office for sooner follow up or seek emergency care.

## 2022-04-30 NOTE — Assessment & Plan Note (Addendum)
Not convinced that this is related to underlying asthma.  She did have borderline moderate obstructive defect today with ratio of 70, FEV1 55%.  She has been minimally responsive to steroids.  She also has no bronchospasm upon exam. FeNO was normal at OV.  No significant elevation in eosinophils on previous blood work.  Prescribed as needed [place].  We will assess her response to this.  Scheduled her for formal pulmonary function testing for further evaluation; unfortunately these are booked out until December so we will move forward with the above workup in the interim.

## 2022-04-30 NOTE — Patient Instructions (Addendum)
Continue flonase nasal spray 2 sprays each nostril daily Continue loratidine (claritin) 1 tab daily for allergies/sinus symptoms  Saline nasal irrigation 1-2 times a day Astelin nasal spray 2 sprays each nostril Twice daily for nasal congestion Mucinex (guaifenesin) 600 mg Twice daily for congestion Albuterol inhaler 2 puffs every 6 hours as needed for shortness of breath or wheezing. Notify if symptoms persist despite rescue inhaler/neb use.   Complete steroids as previously prescribed  Chest x ray was clear today.  You had a mixed obstructive and restrictive defect on your breathing test today. We will order formal pulmonary function testing for further evaluation  CT chest and CT sinus - someone will contact you for scheduling  Echocardiogram - someone will contact you for scheduling  Labs were completed today to look at potential causes for your symptoms  Follow up in 3-4 weeks to discuss above findings with [name] or [name]. If symptoms do not improve or worsen, please contact office for sooner follow up or seek emergency care.
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
## 2022-04-30 NOTE — Assessment & Plan Note (Signed)
Recurrent sinusitis despite multiple antibiotic and steroid courses.  She is currently using Flonase nasal spray and Claritin.  Does have known exposure to mold.  We will obtain allergy testing once she is off steroids.  Advised her that it would be inaccurate at this time.  See above plan.  Add on Astelin nasal spray and sinus rinses.

## 2022-05-01 LAB — BASIC METABOLIC PANEL
BUN/Creatinine Ratio: 24 (ref 12–28)
BUN: 22 mg/dL (ref 8–27)
CO2: 26 mmol/L (ref 20–29)
Calcium: 9 mg/dL (ref 8.7–10.3)
Chloride: 100 mmol/L (ref 96–106)
Creatinine, Ser: 0.9 mg/dL (ref 0.57–1.00)
Glucose: 110 mg/dL — ABNORMAL HIGH (ref 70–99)
Potassium: 4.4 mmol/L (ref 3.5–5.2)
Sodium: 138 mmol/L (ref 134–144)
eGFR: 71 mL/min/{1.73_m2} (ref >59)

## 2022-05-01 LAB — CBC
Hematocrit: 39.2 % (ref 34.0–46.6)
Hemoglobin: 13.7 g/dL (ref 11.1–15.9)
MCH: 32.4 pg (ref 26.6–33.0)
MCHC: 34.9 g/dL (ref 31.5–35.7)
MCV: 93 fL (ref 79–97)
Platelets: 265 10*3/uL (ref 150–450)
RBC: 4.23 x10E6/uL (ref 3.77–5.28)
RDW: 11.9 % (ref 11.7–15.4)
WBC: 10.8 10*3/uL (ref 3.4–10.8)

## 2022-05-01 LAB — PRO B NATRIURETIC PEPTIDE: NT-Pro BNP: 267 pg/mL (ref 0–301)

## 2022-05-01 LAB — TSH: TSH: 0.839 u[IU]/mL (ref 0.450–4.500)

## 2022-05-05 DIAGNOSIS — Z08 Encounter for follow-up examination after completed treatment for malignant neoplasm: Secondary | ICD-10-CM | POA: Diagnosis not present

## 2022-05-05 DIAGNOSIS — D225 Melanocytic nevi of trunk: Secondary | ICD-10-CM | POA: Diagnosis not present

## 2022-05-05 DIAGNOSIS — Z1283 Encounter for screening for malignant neoplasm of skin: Secondary | ICD-10-CM | POA: Diagnosis not present

## 2022-05-05 DIAGNOSIS — Z85820 Personal history of malignant melanoma of skin: Secondary | ICD-10-CM | POA: Diagnosis not present

## 2022-05-05 NOTE — Progress Notes (Signed)
Please notify patient that all her labs were normal. Her blood sugar was slightly elevated, but considered normal for being the end of the day/not fasting. Thanks.

## 2022-05-07 ENCOUNTER — Encounter: Payer: Self-pay | Admitting: Family Medicine

## 2022-05-10 ENCOUNTER — Other Ambulatory Visit: Payer: Self-pay | Admitting: Family Medicine

## 2022-05-10 MED ORDER — ALPRAZOLAM 0.5 MG PO TABS: 0.5000 mg | ORAL_TABLET | Freq: Two times a day (BID) | ORAL | 1 refills | Status: DC | PRN

## 2022-05-11 ENCOUNTER — Telehealth: Payer: Self-pay | Admitting: Cardiology

## 2022-05-11 NOTE — Telephone Encounter (Signed)
Error

## 2022-05-12 ENCOUNTER — Ambulatory Visit (HOSPITAL_BASED_OUTPATIENT_CLINIC_OR_DEPARTMENT_OTHER): Payer: Medicare Other

## 2022-05-12 DIAGNOSIS — R0602 Shortness of breath: Secondary | ICD-10-CM | POA: Diagnosis not present

## 2022-05-12 DIAGNOSIS — I429 Cardiomyopathy, unspecified: Secondary | ICD-10-CM | POA: Diagnosis not present

## 2022-05-12 DIAGNOSIS — J329 Chronic sinusitis, unspecified: Secondary | ICD-10-CM | POA: Diagnosis not present

## 2022-05-12 LAB — ECHOCARDIOGRAM COMPLETE
Area-P 1/2: 4.27 cm2
S' Lateral: 2.8 cm

## 2022-05-13 ENCOUNTER — Ambulatory Visit (HOSPITAL_COMMUNITY): Payer: Medicare Other

## 2022-05-13 ENCOUNTER — Telehealth: Payer: Self-pay | Admitting: Nurse Practitioner

## 2022-05-13 ENCOUNTER — Ambulatory Visit (HOSPITAL_COMMUNITY)
Admission: RE | Admit: 2022-05-13 | Discharge: 2022-05-13 | Disposition: A | Discharge status: Home / Self Care | Payer: Medicare Other | Source: Ambulatory Visit | Attending: Nurse Practitioner | Admitting: Nurse Practitioner

## 2022-05-13 DIAGNOSIS — R0602 Shortness of breath: Secondary | ICD-10-CM | POA: Diagnosis not present

## 2022-05-13 DIAGNOSIS — J342 Deviated nasal septum: Secondary | ICD-10-CM | POA: Diagnosis not present

## 2022-05-13 DIAGNOSIS — R0600 Dyspnea, unspecified: Secondary | ICD-10-CM | POA: Diagnosis not present

## 2022-05-13 DIAGNOSIS — R531 Weakness: Secondary | ICD-10-CM | POA: Diagnosis not present

## 2022-05-13 DIAGNOSIS — J329 Chronic sinusitis, unspecified: Secondary | ICD-10-CM | POA: Insufficient documentation

## 2022-05-13 DIAGNOSIS — I700 Atherosclerosis of aorta: Secondary | ICD-10-CM | POA: Diagnosis not present

## 2022-05-13 DIAGNOSIS — I429 Cardiomyopathy, unspecified: Secondary | ICD-10-CM | POA: Diagnosis not present

## 2022-05-13 MED ORDER — SODIUM CHLORIDE (PF) 0.9 % IJ SOLN
INTRAMUSCULAR | Status: AC
  Filled 2022-05-13: qty 50

## 2022-05-13 MED ORDER — IOHEXOL 300 MG/ML  SOLN
75.0000 mL | Freq: Once | INTRAMUSCULAR | Status: AC | PRN
  Administered 2022-05-13: 75 mL via INTRAVENOUS

## 2022-05-13 NOTE — Telephone Encounter (Signed)
Per Maryclare Labrador:  ptSakura Denis Gordon, [phone] on the schedule for today 05/13/22, the maxilofacial and ct chest wre scheduled at differen times, we merged schedule time. ct chest has not been cancelle, she is still on for both exams. thanks = [place] ct   Pt did not answer call 3 times so I have left a message for pt to advise. Nothing further needed at this time.

## 2022-05-13 NOTE — Telephone Encounter (Addendum)
The PCCs did not cancel this.  I have opened a secure chat with Katie & Maryclare Labrador who cancelled the CT.

## 2022-05-13 NOTE — Telephone Encounter (Signed)
I don't see where it was ordered incorrectly and looks like it is scheduled for today at 1:30 as well as CT sinus.

## 2022-05-13 NOTE — Telephone Encounter (Signed)
Please advise on this for [place].

## 2022-05-14 ENCOUNTER — Telehealth: Payer: Self-pay | Admitting: Nurse Practitioner

## 2022-05-14 NOTE — Progress Notes (Signed)
CT sinus was nl. Thanks

## 2022-05-14 NOTE — Telephone Encounter (Signed)
Left message for patient to call back  

## 2022-05-18 NOTE — Telephone Encounter (Signed)
ATC pt and left detailed message to make an appt to see one of our providers in the office.

## 2022-05-18 NOTE — Telephone Encounter (Signed)
Spoke with [name] who states having SOB and cough which has not improved since seeing Katie 04/30/22. [name] denies fever/ chills/ GI upset. [name] states she is using her albuterol inhaler as needed but it isn't helping. Judson Roch can you please advise as Joellen Jersey isn't available right now.   Triage please call [name] at [phone]

## 2022-05-18 NOTE — Progress Notes (Signed)
Please notify patient that her CT chest showed some mild scarring in her right lung; otherwise no significant findings. I wouldn't expect these findings to be causing the degree of symptoms she's having. She also has some plaque buildup in her arteries; should discuss with her PCP or a heart doctor, if she has one. Thanks.

## 2022-05-18 NOTE — Telephone Encounter (Signed)
If  patient continues to have ongoing symptoms that are not improving or worsen will need office visit for further evaluation  Please contact office for sooner follow up if symptoms do not improve or worsen or seek emergency care

## 2022-05-19 ENCOUNTER — Other Ambulatory Visit (HOSPITAL_COMMUNITY): Payer: Medicare Other

## 2022-05-24 ENCOUNTER — Telehealth: Payer: Self-pay | Admitting: Nurse Practitioner

## 2022-05-24 NOTE — Telephone Encounter (Signed)
Closing encounter

## 2022-05-25 NOTE — Telephone Encounter (Signed)
CMA spoke with patient yesterday and reviewed results. Nothing further needed.

## 2022-05-26 ENCOUNTER — Encounter: Payer: Self-pay | Admitting: Nurse Practitioner

## 2022-05-26 ENCOUNTER — Ambulatory Visit: Payer: Medicare Other | Admitting: Nurse Practitioner

## 2022-05-26 VITALS — BP 132/78 | HR 97 | Ht 65.5 in | Wt 160.8 lb

## 2022-05-26 DIAGNOSIS — J310 Chronic rhinitis: Secondary | ICD-10-CM | POA: Diagnosis not present

## 2022-05-26 DIAGNOSIS — R0609 Other forms of dyspnea: Secondary | ICD-10-CM | POA: Diagnosis not present

## 2022-05-26 DIAGNOSIS — J4520 Mild intermittent asthma, uncomplicated: Secondary | ICD-10-CM

## 2022-05-26 DIAGNOSIS — R053 Chronic cough: Secondary | ICD-10-CM | POA: Diagnosis not present

## 2022-05-26 DIAGNOSIS — I428 Other cardiomyopathies: Secondary | ICD-10-CM

## 2022-05-26 NOTE — Assessment & Plan Note (Signed)
Unrevealing workup. Initial suspicion was postnasal drip d/t chronic rhinitis. CT sinus was clear; although, cough has improved some with addition of astelin nasal spray. Possible she still has some upper airway irritation with throat clearing. Also question VCD? Encouraged her to see ENT, as previously referred, so they can perform direct visualization of her larynx. CT chest did not have any overwhelming findings. Not consistent with asthmatic bronchitis as FeNO has been normal.

## 2022-05-26 NOTE — Assessment & Plan Note (Signed)
Echo was stable with low normal EF 50-55%. No significant valvular disease or evidence of right heart strain. She is coming due for her appt with Dr. Percival Spanish; last seen February 2022. Encouraged her to discuss her symptoms with him as well to evaluate for cardiac component.

## 2022-05-26 NOTE — Progress Notes (Signed)
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 - encouraged her to increase activity as tolerated.  Patient Instructions  Continue flonase nasal spray 2 sprays each nostril daily Continue loratidine (claritin) 1 tab daily for allergies/sinus symptoms Continue Saline nasal irrigation 1-2 times a day Continue Astelin nasal spray 2 sprays each nostril Twice daily for nasal congestion Continue Mucinex (guaifenesin) 600 mg Twice daily as needed for congestion  Try using Albuterol inhaler 2 puffs every 6 hours as needed for shortness of breath or wheezing. If symptoms improve, we may consider a daily maintenance inhaler   Pulmonary function testing was scheduled today   Consider seeing [facility] Thro[facility] for vocal cord evaluation - orders previously placed. This can cause a persistent cough and shortness of breath   Follow up with Dr. Lamonte Sakai after PFTs. If symptoms do not improve or worsen, please contact office for sooner follow up or seek emergency care.  Chronic rhinitis Slight improvement with addition of saline spray and astelin  nasal spray. She will continue these, flonase, and claritin. CT sinus was clear.   Asthma Mixed obstruction and restriction on previous spirometry. Lung exams have been clear. FeNO was nl and previous CBC with eosinophils 100. No significant response to steroids. We are awaiting formal PFTs. She is going to trial PRN SABA to see if she has any perceived benefit. If so, could consider maintenance ICS/LABA therapy.   Chronic cough Unrevealing workup. Initial suspicion was postnasal drip d/t chronic rhinitis. CT sinus was clear; although, cough has improved some with addition of astelin nasal spray. Possible she still has some upper airway irritation with throat clearing. Also question VCD? Encouraged her to see ENT, as previously referred, so they can perform direct visualization of her larynx. CT chest did not have any overwhelming findings. Not consistent with asthmatic bronchitis as FeNO has been normal.   Nonischemic cardiomyopathy ([place]) Echo was stable with low normal EF 50-55%. No significant valvular disease or evidence of right heart strain. She is coming due for her appt with Dr. Percival Spanish; last seen February 2022. Encouraged her to discuss her symptoms with him as well to evaluate for cardiac component.     I spent 38 minutes of dedicated to the care of this patient on the date of this encounter to include pre-visit review of records, face-to-face time with the patient discussing conditions above, post visit ordering of testing, clinical documentation with the electronic health record, making appropriate referrals as documented, and communicating necessary findings to members of the patients care team.  Clayton Bibles, NP 05/26/2022  Pt aware and understands NP's role.

## 2022-05-26 NOTE — Assessment & Plan Note (Addendum)
Persistent DOE with associated fatigue and generalized weakness with worsening since September. She had extensive workup completed with reassuring imaging, echo and labs. CT chest showed some scarring and volume loss in the right lung, likely related to previous pneumonitis from chemotherapy. She did have spirometry completed at her last OV 11/3 with decline in FVC and FEV1 when compared to 2021. FeNO was nl at the time. Formal PFTs were ordered but never completed. She was also prescribed trial of albuterol, which she has not tried. Encouraged her to trial this and notify if she has any perceived benefit. We will schedule her for PFTs today. Suspect a component of her symptoms are related to deconditioning and grief reaction - encouraged her to increase activity as tolerated.  Patient Instructions  Continue flonase nasal spray 2 sprays each nostril daily Continue loratidine (claritin) 1 tab daily for allergies/sinus symptoms Continue Saline nasal irrigation 1-2 times a day Continue Astelin nasal spray 2 sprays each nostril Twice daily for nasal congestion Continue Mucinex (guaifenesin) 600 mg Twice daily as needed for congestion  Try using Albuterol inhaler 2 puffs every 6 hours as needed for shortness of breath or wheezing. If symptoms improve, we may consider a daily maintenance inhaler   Pulmonary function testing was scheduled today   Consider seeing Ear Nose and Throat for vocal cord evaluation - orders previously placed. This can cause a persistent cough and shortness of breath   Follow up with Dr. Lamonte Sakai after PFTs. If symptoms do not improve or worsen, please contact office for sooner follow up or seek emergency care.

## 2022-05-26 NOTE — Assessment & Plan Note (Addendum)
Slight improvement with addition of saline spray and astelin nasal spray. She will continue these, flonase, and claritin. CT sinus was clear.

## 2022-05-26 NOTE — Assessment & Plan Note (Addendum)
Mixed obstruction and restriction on previous spirometry. Lung exams have been clear. FeNO was nl and previous CBC with eosinophils 100. No significant response to steroids. We are awaiting formal PFTs. She is going to trial PRN SABA to see if she has any perceived benefit. If so, could consider maintenance ICS/LABA therapy.

## 2022-05-26 NOTE — Patient Instructions (Addendum)
Continue flonase nasal spray 2 sprays each nostril daily Continue loratidine (claritin) 1 tab daily for allergies/sinus symptoms Continue Saline nasal irrigation 1-2 times a day Continue Astelin nasal spray 2 sprays each nostril Twice daily for nasal congestion Continue Mucinex (guaifenesin) 600 mg Twice daily as needed for congestion  Try using Albuterol inhaler 2 puffs every 6 hours as needed for shortness of breath or wheezing. If symptoms improve, we may consider a daily maintenance inhaler   Pulmonary function testing was scheduled today   Consider seeing [facility] Nose and Throat for vocal cord evaluation - orders previously placed. This can cause a persistent cough and shortness of breath   Follow up with Dr. Lamonte Sakai after PFTs. If symptoms do not improve or worsen, please contact office for sooner follow up or seek emergency care.

## 2022-05-31 DIAGNOSIS — M7061 Trochanteric bursitis, right hip: Secondary | ICD-10-CM | POA: Diagnosis not present

## 2022-06-01 ENCOUNTER — Ambulatory Visit: Payer: Medicare Other | Admitting: Adult Health

## 2022-07-13 ENCOUNTER — Ambulatory Visit: Payer: Medicare Other | Admitting: Emergency Medicine

## 2022-07-17 ENCOUNTER — Other Ambulatory Visit: Payer: Self-pay | Admitting: Family Medicine

## 2022-07-21 ENCOUNTER — Ambulatory Visit (INDEPENDENT_AMBULATORY_CARE_PROVIDER_SITE_OTHER): Payer: Medicare Other | Admitting: Emergency Medicine

## 2022-07-21 ENCOUNTER — Ambulatory Visit (INDEPENDENT_AMBULATORY_CARE_PROVIDER_SITE_OTHER): Payer: Medicare Other

## 2022-07-21 VITALS — Wt 158.0 lb

## 2022-07-21 DIAGNOSIS — Z0000 Encounter for general adult medical examination without abnormal findings: Secondary | ICD-10-CM

## 2022-07-21 DIAGNOSIS — R0602 Shortness of breath: Secondary | ICD-10-CM

## 2022-07-21 LAB — PULMONARY FUNCTION TEST
DL/VA % pred: 79 %
DL/VA: 3.3 ml/min/mmHg/L
DLCO cor % pred: 64 %
DLCO cor: 13.27 ml/min/mmHg
DLCO unc % pred: 64 %
DLCO unc: 13.27 ml/min/mmHg
FEF 25-75 Post: 1.88 L/sec
FEF 25-75 Pre: 1.86 L/sec
FEF2575-%Change-Post: 0 %
FEF2575-%Pred-Post: 85 %
FEF2575-%Pred-Pre: 85 %
FEV1-%Change-Post: 0 %
FEV1-%Pred-Post: 82 %
FEV1-%Pred-Pre: 83 %
FEV1-Post: 2.08 L
FEV1-Pre: 2.1 L
FEV1FVC-%Change-Post: 0 %
FEV1FVC-%Pred-Pre: 102 %
FEV6-%Change-Post: -1 %
FEV6-%Pred-Post: 82 %
FEV6-%Pred-Pre: 83 %
FEV6-Post: 2.61 L
FEV6-Pre: 2.65 L
FEV6FVC-%Change-Post: 0 %
FEV6FVC-%Pred-Post: 103 %
FEV6FVC-%Pred-Pre: 103 %
FVC-%Change-Post: -1 %
FVC-%Pred-Post: 79 %
FVC-%Pred-Pre: 81 %
FVC-Post: 2.62 L
FVC-Pre: 2.66 L
Post FEV1/FVC ratio: 80 %
Post FEV6/FVC ratio: 100 %
Pre FEV1/FVC ratio: 79 %
Pre FEV6/FVC Ratio: 100 %
RV % pred: 83 %
RV: 1.79 L
TLC % pred: 84 %
TLC: 4.41 L

## 2022-07-21 NOTE — Progress Notes (Signed)
PFT done today. 

## 2022-07-21 NOTE — Progress Notes (Signed)
I have reviewed and agree with [facility] documentation.  Kathlene November, MD
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
## 2022-07-21 NOTE — Patient Instructions (Signed)
[name] , Thank you for taking time to come for your Medicare Wellness Visit. I appreciate your ongoing commitment to your health goals. Please review the following plan we discussed and let me know if I can assist you in the future.   These are the goals we discussed:  Goals      Patient Stated     Continue to eat healthy and exercise regularly.     Patient Stated     Get more active         This is a list of the screening recommended for you and due dates:  Health Maintenance  Topic Date Due   Zoster (Shingles) Vaccine (1 of 2) Never done   Pap Smear  05/29/2018   DTaP/Tdap/Td vaccine (3 - Tdap) 09/26/2020   Pneumonia Vaccine (4 - PPSV23 or PCV20) 03/23/2022   Medicare Annual Wellness Visit  07/22/2023   Mammogram  02/06/2024   Colon Cancer Screening  04/08/2029   DEXA scan (bone density measurement)  Completed   Hepatitis C Screening: USPSTF Recommendation to screen - Ages 59-79 yo.  Completed   HIV Screening  Completed   HPV Vaccine  Aged Out   Flu Shot  Discontinued   COVID-19 Vaccine  Discontinued    Advanced directives: Please bring a copy of your health care power of attorney and living will to the office at your convenience.  Conditions/risks identified: get more active  Next appointment: Follow up in one year for your annual wellness visit    Preventive Care 66 Years and Older, Female Preventive care refers to lifestyle choices and visits with your health care provider that can promote health and wellness. What does preventive care include? A yearly physical exam. This is also called an annual well check. Dental exams once or twice a year. Routine eye exams. Ask your health care provider how often you should have your eyes checked. Personal lifestyle choices, including: Daily care of your teeth and gums. Regular physical activity. Eating a healthy diet. Avoiding tobacco and drug use. Limiting alcohol use. Practicing safe sex. Taking low-dose aspirin  every day. Taking vitamin and mineral supplements as recommended by your health care provider. What happens during an annual well check? The services and screenings done by your health care provider during your annual well check will depend on your age, overall health, lifestyle risk factors, and family history of disease. Counseling  Your health care provider may ask you questions about your: Alcohol use. Tobacco use. Drug use. Emotional well-being. Home and relationship well-being. Sexual activity. Eating habits. History of falls. Memory and ability to understand (cognition). Work and work Statistician. Reproductive health. Screening  You may have the following tests or measurements: Height, weight, and BMI. Blood pressure. Lipid and cholesterol levels. These may be checked every 5 years, or more frequently if you are over 28 years old. Skin check. Lung cancer screening. You may have this screening every year starting at age 14 if you have a 30-pack-year history of smoking and currently smoke or have quit within the past 15 years. Fecal occult blood test (FOBT) of the stool. You may have this test every year starting at age 66. Flexible sigmoidoscopy or colonoscopy. You may have a sigmoidoscopy every 5 years or a colonoscopy every 10 years starting at age 66. Hepatitis C blood test. Hepatitis B blood test. Sexually transmitted disease (STD) testing. Diabetes screening. This is done by checking your blood sugar (glucose) after you have not eaten for a while (  fasting). You may have this done every 1-3 years. Bone density scan. This is done to screen for osteoporosis. You may have this done starting at age 66. Mammogram. This may be done every 1-2 years. Talk to your health care provider about how often you should have regular mammograms. Talk with your health care provider about your test results, treatment options, and if necessary, the need for more tests. Vaccines  Your health  care provider may recommend certain vaccines, such as: Influenza vaccine. This is recommended every year. Tetanus, diphtheria, and acellular pertussis (Tdap, Td) vaccine. You may need a Td booster every 10 years. Zoster vaccine. You may need this after age 66. Pneumococcal 13-valent conjugate (PCV13) vaccine. One dose is recommended after age 66. Pneumococcal polysaccharide (PPSV23) vaccine. One dose is recommended after age 66. Talk to your health care provider about which screenings and vaccines you need and how often you need them. This information is not intended to replace advice given to you by your health care provider. Make sure you discuss any questions you have with your health care provider. Document Released: 07/11/2015 Document Revised: 03/03/2016 Document Reviewed: 04/15/2015 Elsevier Interactive Patient Education  2017 [place] Prevention in the Home Falls can cause injuries. They can happen to people of all ages. There are many things you can do to make your home safe and to help prevent falls. What can I do on the outside of my home? Regularly fix the edges of walkways and driveways and fix any cracks. Remove anything that might make you trip as you walk through a door, such as a raised step or threshold. Trim any bushes or trees on the path to your home. Use bright outdoor lighting. Clear any walking paths of anything that might make someone trip, such as rocks or tools. Regularly check to see if handrails are loose or broken. Make sure that both sides of any steps have handrails. Any raised decks and porches should have guardrails on the edges. Have any leaves, snow, or ice cleared regularly. Use sand or salt on walking paths during winter. Clean up any spills in your garage right away. This includes oil or grease spills. What can I do in the bathroom? Use night lights. Install grab bars by the toilet and in the tub and shower. Do not use towel bars as grab  bars. Use non-skid mats or decals in the tub or shower. If you need to sit down in the shower, use a plastic, non-slip stool. Keep the floor dry. Clean up any water that spills on the floor as soon as it happens. Remove soap buildup in the tub or shower regularly. Attach bath mats securely with double-sided non-slip rug tape. Do not have throw rugs and other things on the floor that can make you trip. What can I do in the bedroom? Use night lights. Make sure that you have a light by your bed that is easy to reach. Do not use any sheets or blankets that are too big for your bed. They should not hang down onto the floor. Have a firm chair that has side arms. You can use this for support while you get dressed. Do not have throw rugs and other things on the floor that can make you trip. What can I do in the kitchen? Clean up any spills right away. Avoid walking on wet floors. Keep items that you use a lot in easy-to-reach places. If you need to reach something above you, use  a strong step stool that has a grab bar. Keep electrical cords out of the way. Do not use floor polish or wax that makes floors slippery. If you must use wax, use non-skid floor wax. Do not have throw rugs and other things on the floor that can make you trip. What can I do with my stairs? Do not leave any items on the stairs. Make sure that there are handrails on both sides of the stairs and use them. Fix handrails that are broken or loose. Make sure that handrails are as long as the stairways. Check any carpeting to make sure that it is firmly attached to the stairs. Fix any carpet that is loose or worn. Avoid having throw rugs at the top or bottom of the stairs. If you do have throw rugs, attach them to the floor with carpet tape. Make sure that you have a light switch at the top of the stairs and the bottom of the stairs. If you do not have them, ask someone to add them for you. What else can I do to help prevent  falls? Wear shoes that: Do not have high heels. Have rubber bottoms. Are comfortable and fit you well. Are closed at the toe. Do not wear sandals. If you use a stepladder: Make sure that it is fully opened. Do not climb a closed stepladder. Make sure that both sides of the stepladder are locked into place. Ask someone to hold it for you, if possible. Clearly mark and make sure that you can see: Any grab bars or handrails. First and last steps. Where the edge of each step is. Use tools that help you move around (mobility aids) if they are needed. These include: Canes. Walkers. Scooters. Crutches. Turn on the lights when you go into a dark area. Replace any light bulbs as soon as they burn out. Set up your furniture so you have a clear path. Avoid moving your furniture around. If any of your floors are uneven, fix them. If there are any pets around you, be aware of where they are. Review your medicines with your doctor. Some medicines can make you feel dizzy. This can increase your chance of falling. Ask your doctor what other things that you can do to help prevent falls. This information is not intended to replace advice given to you by your health care provider. Make sure you discuss any questions you have with your health care provider. Document Released: 04/10/2009 Document Revised: 11/20/2015 Document Reviewed: 07/19/2014 Elsevier Interactive Patient Education  2017 [employer].

## 2022-07-25 DIAGNOSIS — M8580 Other specified disorders of bone density and structure, unspecified site: Secondary | ICD-10-CM | POA: Insufficient documentation

## 2022-07-25 NOTE — Assessment & Plan Note (Addendum)
Has been struggling with her own health concerns and family and friends but overall is stable

## 2022-07-25 NOTE — Assessment & Plan Note (Signed)
Hydrate and monitor 

## 2022-07-25 NOTE — Assessment & Plan Note (Signed)
On Levothyroxine, continue to monitor 

## 2022-07-25 NOTE — Assessment & Plan Note (Signed)
Encourage heart healthy diet such as MIND or DASH diet, increase exercise, avoid trans fats, simple carbohydrates and processed foods, consider a krill or fish or flaxseed oil cap daily.  °

## 2022-07-25 NOTE — Assessment & Plan Note (Addendum)
Patient encouraged to maintain heart healthy diet, regular exercise, adequate sleep. Consider daily probiotics. Take medications as prescribed Labs reviewed. Given and reviewed copy of ACP documents from [employer] and encouraged to complete and return. Colonoscopy 2020 repeat 2025 Mgm 01/2022 repeat in next year Dexa 2022 repeat in 24 or 25

## 2022-07-25 NOTE — Assessment & Plan Note (Signed)
Bone density shows osteopenia, which is thinner than normal but not as bad as osteoporosis. Recommend calcium intake of 1200 to 1500 mg daily, divided into roughly 3 doses. Best source is the diet and a single dairy serving is about 500 mg, a supplement of calcium citrate once or twice daily to balance diet is fine if not getting enough in diet. Also need Vitamin D 2000 IU caps, 1 cap daily if not already taking vitamin D. Also recommend weight baring exercise on hips and upper body to keep bones strong  

## 2022-07-25 NOTE — Assessment & Plan Note (Signed)
Encouraged good sleep hygiene such as dark, quiet room. No blue/green glowing lights such as computer screens in bedroom. No alcohol or stimulants in evening. Cut down on caffeine as able. Regular exercise is helpful but not just prior to bed time. Has tried Trazodone 25 mg and no concerning side effects but still not fully effective will increase to 50 mg qhs.

## 2022-07-26 ENCOUNTER — Ambulatory Visit (INDEPENDENT_AMBULATORY_CARE_PROVIDER_SITE_OTHER): Payer: Medicare Other | Admitting: Family Medicine

## 2022-07-26 ENCOUNTER — Encounter: Payer: Self-pay | Admitting: Family Medicine

## 2022-07-26 VITALS — BP 132/78 | HR 50 | Temp 98.0°F | Resp 16 | Ht 65.0 in | Wt 165.0 lb

## 2022-07-26 DIAGNOSIS — E559 Vitamin D deficiency, unspecified: Secondary | ICD-10-CM | POA: Diagnosis not present

## 2022-07-26 DIAGNOSIS — F418 Other specified anxiety disorders: Secondary | ICD-10-CM | POA: Diagnosis not present

## 2022-07-26 DIAGNOSIS — E785 Hyperlipidemia, unspecified: Secondary | ICD-10-CM

## 2022-07-26 DIAGNOSIS — R739 Hyperglycemia, unspecified: Secondary | ICD-10-CM | POA: Diagnosis not present

## 2022-07-26 DIAGNOSIS — G4700 Insomnia, unspecified: Secondary | ICD-10-CM

## 2022-07-26 DIAGNOSIS — Z0000 Encounter for general adult medical examination without abnormal findings: Secondary | ICD-10-CM

## 2022-07-26 DIAGNOSIS — E039 Hypothyroidism, unspecified: Secondary | ICD-10-CM

## 2022-07-26 DIAGNOSIS — M25551 Pain in right hip: Secondary | ICD-10-CM | POA: Diagnosis not present

## 2022-07-26 DIAGNOSIS — N189 Chronic kidney disease, unspecified: Secondary | ICD-10-CM

## 2022-07-26 DIAGNOSIS — M8580 Other specified disorders of bone density and structure, unspecified site: Secondary | ICD-10-CM

## 2022-07-26 NOTE — Patient Instructions (Addendum)
Encouraged increased hydration and fiber in diet. Daily probiotics. If bowels not moving can use Milk Of Magnesia  2 tbls in 4 oz of warm prune juice by mouth .  with  Dulcolax suppository pr, may repeat again in 4 more hours as needed. Seek care if symptoms worsen. Consider daily Miralax and Benefiber with and/or Dulcolax if symptoms persist.    Consider completing Advanced Directive paperwork, Media of Attorney need copy for chart  Preventive Care 71 Years and Older, Female Preventive care refers to lifestyle choices and visits with your health care provider that can promote health and wellness. Preventive care visits are also called wellness exams. What can I expect for my preventive care visit? Counseling Your health care provider may ask you questions about your: Medical history, including: Past medical problems. Family medical history. Pregnancy and menstrual history. History of falls. Current health, including: Memory and ability to understand (cognition). Emotional well-being. Home life and relationship well-being. Sexual activity and sexual health. Lifestyle, including: Alcohol, nicotine or tobacco, and drug use. Access to firearms. Diet, exercise, and sleep habits. Work and work Statistician. Sunscreen use. Safety issues such as seatbelt and bike helmet use. Physical exam Your health care provider will check your: Height and weight. These may be used to calculate your BMI (body mass index). BMI is a measurement that tells if you are at a healthy weight. Waist circumference. This measures the distance around your waistline. This measurement also tells if you are at a healthy weight and may help predict your risk of certain diseases, such as type 2 diabetes and high blood pressure. Heart rate and blood pressure. Body temperature. Skin for abnormal spots. What immunizations do I need?  Vaccines are usually given at various ages, according to a schedule. Your  health care provider will recommend vaccines for you based on your age, medical history, and lifestyle or other factors, such as travel or where you work. What tests do I need? Screening Your health care provider may recommend screening tests for certain conditions. This may include: Lipid and cholesterol levels. Hepatitis C test. Hepatitis B test. HIV (human immunodeficiency virus) test. STI (sexually transmitted infection) testing, if you are at risk. Lung cancer screening. Colorectal cancer screening. Diabetes screening. This is done by checking your blood sugar (glucose) after you have not eaten for a while (fasting). Mammogram. Talk with your health care provider about how often you should have regular mammograms. BRCA-related cancer screening. This may be done if you have a family history of breast, ovarian, tubal, or peritoneal cancers. Bone density scan. This is done to screen for osteoporosis. Talk with your health care provider about your test results, treatment options, and if necessary, the need for more tests. Follow these instructions at home: Eating and drinking  Eat a diet that includes fresh fruits and vegetables, whole grains, lean protein, and low-fat dairy products. Limit your intake of foods with high amounts of sugar, saturated fats, and salt. Take vitamin and mineral supplements as recommended by your health care provider. Do not drink alcohol if your health care provider tells you not to drink. If you drink alcohol: Limit how much you have to 0-1 drink a day. Know how much alcohol is in your drink. In the [place] one drink equals one 12 oz bottle of beer (355 mL), one 5 oz glass of wine (148 mL), or one 1 oz glass of hard liquor (44 mL). Lifestyle Brush your teeth every morning and night with fluoride toothpaste.  Floss one time each day. Exercise for at least 30 minutes 5 or more days each week. Do not use any products that contain nicotine or tobacco. These  products include cigarettes, chewing tobacco, and vaping devices, such as e-cigarettes. If you need help quitting, ask your health care provider. Do not use drugs. If you are sexually active, practice safe sex. Use a condom or other form of protection in order to prevent STIs. Take aspirin only as told by your health care provider. Make sure that you understand how much to take and what form to take. Work with your health care provider to find out whether it is safe and beneficial for you to take aspirin daily. Ask your health care provider if you need to take a cholesterol-lowering medicine (statin). Find healthy ways to manage stress, such as: Meditation, yoga, or listening to music. Journaling. Talking to a trusted person. Spending time with friends and family. Minimize exposure to UV radiation to reduce your risk of skin cancer. Safety Always wear your seat belt while driving or riding in a vehicle. Do not drive: If you have been drinking alcohol. Do not ride with someone who has been drinking. When you are tired or distracted. While texting. If you have been using any mind-altering substances or drugs. Wear a helmet and other protective equipment during sports activities. If you have firearms in your house, make sure you follow all gun safety procedures. What's next? Visit your health care provider once a year for an annual wellness visit. Ask your health care provider how often you should have your eyes and teeth checked. Stay up to date on all vaccines. This information is not intended to replace advice given to you by your health care provider. Make sure you discuss any questions you have with your health care provider. Document Revised: 12/10/2020 Document Reviewed: 12/10/2020 Elsevier Patient Education  [place] 65 Years and Older, Female Preventive care refers to lifestyle choices and visits with your health care provider that can promote health  and wellness. Preventive care visits are also called wellness exams. What can I expect for my preventive care visit? Counseling Your health care provider may ask you questions about your: Medical history, including: Past medical problems. Family medical history. Pregnancy and menstrual history. History of falls. Current health, including: Memory and ability to understand (cognition). Emotional well-being. Home life and relationship well-being. Sexual activity and sexual health. Lifestyle, including: Alcohol, nicotine or tobacco, and drug use. Access to firearms. Diet, exercise, and sleep habits. Work and work Statistician. Sunscreen use. Safety issues such as seatbelt and bike helmet use. Physical exam Your health care provider will check your: Height and weight. These may be used to calculate your BMI (body mass index). BMI is a measurement that tells if you are at a healthy weight. Waist circumference. This measures the distance around your waistline. This measurement also tells if you are at a healthy weight and may help predict your risk of certain diseases, such as type 2 diabetes and high blood pressure. Heart rate and blood pressure. Body temperature. Skin for abnormal spots. What immunizations do I need?  Vaccines are usually given at various ages, according to a schedule. Your health care provider will recommend vaccines for you based on your age, medical history, and lifestyle or other factors, such as travel or where you work. What tests do I need? Screening Your health care provider may recommend screening tests for certain conditions. This may  include: Lipid and cholesterol levels. Hepatitis C test. Hepatitis B test. HIV (human immunodeficiency virus) test. STI (sexually transmitted infection) testing, if you are at risk. Lung cancer screening. Colorectal cancer screening. Diabetes screening. This is done by checking your blood sugar (glucose) after you have not  eaten for a while (fasting). Mammogram. Talk with your health care provider about how often you should have regular mammograms. BRCA-related cancer screening. This may be done if you have a family history of breast, ovarian, tubal, or peritoneal cancers. Bone density scan. This is done to screen for osteoporosis. Talk with your health care provider about your test results, treatment options, and if necessary, the need for more tests. Follow these instructions at home: Eating and drinking  Eat a diet that includes fresh fruits and vegetables, whole grains, lean protein, and low-fat dairy products. Limit your intake of foods with high amounts of sugar, saturated fats, and salt. Take vitamin and mineral supplements as recommended by your health care provider. Do not drink alcohol if your health care provider tells you not to drink. If you drink alcohol: Limit how much you have to 0-1 drink a day. Know how much alcohol is in your drink. In the [place] one drink equals one 12 oz bottle of beer (355 mL), one 5 oz glass of wine (148 mL), or one 1 oz glass of hard liquor (44 mL). Lifestyle Brush your teeth every morning and night with fluoride toothpaste. Floss one time each day. Exercise for at least 30 minutes 5 or more days each week. Do not use any products that contain nicotine or tobacco. These products include cigarettes, chewing tobacco, and vaping devices, such as e-cigarettes. If you need help quitting, ask your health care provider. Do not use drugs. If you are sexually active, practice safe sex. Use a condom or other form of protection in order to prevent STIs. Take aspirin only as told by your health care provider. Make sure that you understand how much to take and what form to take. Work with your health care provider to find out whether it is safe and beneficial for you to take aspirin daily. Ask your health care provider if you need to take a cholesterol-lowering medicine (statin). Find  healthy ways to manage stress, such as: Meditation, yoga, or listening to music. Journaling. Talking to a trusted person. Spending time with friends and family. Minimize exposure to UV radiation to reduce your risk of skin cancer. Safety Always wear your seat belt while driving or riding in a vehicle. Do not drive: If you have been drinking alcohol. Do not ride with someone who has been drinking. When you are tired or distracted. While texting. If you have been using any mind-altering substances or drugs. Wear a helmet and other protective equipment during sports activities. If you have firearms in your house, make sure you follow all gun safety procedures. What's next? Visit your health care provider once a year for an annual wellness visit. Ask your health care provider how often you should have your eyes and teeth checked. Stay up to date on all vaccines. This information is not intended to replace advice given to you by your health care provider. Make sure you discuss any questions you have with your health care provider. Document Revised: 12/10/2020 Document Reviewed: 12/10/2020 Elsevier Patient Education  [place].

## 2022-07-27 LAB — COMPREHENSIVE METABOLIC PANEL
ALT: 15 U/L (ref 0–35)
AST: 21 U/L (ref 0–37)
Albumin: 4.2 g/dL (ref 3.5–5.2)
Alkaline Phosphatase: 77 U/L (ref 39–117)
BUN: 23 mg/dL (ref 6–23)
CO2: 29 mEq/L (ref 19–32)
Calcium: 9.2 mg/dL (ref 8.4–10.5)
Chloride: 101 mEq/L (ref 96–112)
Creatinine, Ser: 1.03 mg/dL (ref 0.40–1.20)
GFR: 57.13 mL/min — ABNORMAL LOW (ref >60.00)
Glucose, Bld: 84 mg/dL (ref 70–99)
Potassium: 4.1 mEq/L (ref 3.5–5.1)
Sodium: 139 mEq/L (ref 135–145)
Total Bilirubin: 0.4 mg/dL (ref 0.2–1.2)
Total Protein: 6.4 g/dL (ref 6.0–8.3)

## 2022-07-27 LAB — CBC WITH DIFFERENTIAL/PLATELET
Basophils Absolute: 0.1 10*3/uL (ref 0.0–0.1)
Basophils Relative: 1.2 % (ref 0.0–3.0)
Eosinophils Absolute: 0.1 10*3/uL (ref 0.0–0.7)
Eosinophils Relative: 1.5 % (ref 0.0–5.0)
HCT: 41.6 % (ref 36.0–46.0)
Hemoglobin: 13.9 g/dL (ref 12.0–15.0)
Lymphocytes Relative: 32.1 % (ref 12.0–46.0)
Lymphs Abs: 2.4 10*3/uL (ref 0.7–4.0)
MCHC: 33.3 g/dL (ref 30.0–36.0)
MCV: 97.2 fl (ref 78.0–100.0)
Monocytes Absolute: 0.5 10*3/uL (ref 0.1–1.0)
Monocytes Relative: 6.5 % (ref 3.0–12.0)
Neutro Abs: 4.3 10*3/uL (ref 1.4–7.7)
Neutrophils Relative %: 58.7 % (ref 43.0–77.0)
Platelets: 267 10*3/uL (ref 150.0–400.0)
RBC: 4.29 Mil/uL (ref 3.87–5.11)
RDW: 12.6 % (ref 11.5–15.5)
WBC: 7.4 10*3/uL (ref 4.0–10.5)

## 2022-07-27 LAB — HEMOGLOBIN A1C: Hgb A1c MFr Bld: 5.8 % (ref 4.6–6.5)

## 2022-07-27 LAB — LIPID PANEL
Cholesterol: 200 mg/dL (ref 0–200)
HDL: 59.2 mg/dL (ref >39.00)
LDL Cholesterol: 124 mg/dL — ABNORMAL HIGH (ref 0–99)
NonHDL: 140.71
Total CHOL/HDL Ratio: 3
Triglycerides: 83 mg/dL (ref 0.0–149.0)
VLDL: 16.6 mg/dL (ref 0.0–40.0)

## 2022-07-27 LAB — TSH: TSH: 0.84 u[IU]/mL (ref 0.35–5.50)

## 2022-07-27 LAB — VITAMIN D 25 HYDROXY (VIT D DEFICIENCY, FRACTURES): VITD: 40.25 ng/mL (ref 30.00–100.00)

## 2022-07-28 DIAGNOSIS — G5711 Meralgia paresthetica, right lower limb: Secondary | ICD-10-CM | POA: Diagnosis not present

## 2022-08-01 NOTE — Assessment & Plan Note (Signed)
Despite history of R THR. Will need to follow up with ortho if pain persists or worsens.

## 2022-08-01 NOTE — Assessment & Plan Note (Signed)
Supplement and monitor 
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
## 2022-08-03 NOTE — Progress Notes (Signed)
Overall, PFTs are relatively stable and her lung function remains normal. She does have a decline in her DLCO from 2 years ago but stable when compared to 7 years ago. Can discuss further at her f/u with Dr. Lamonte Sakai. Thanks!

## 2022-08-04 NOTE — Progress Notes (Signed)
ATC x1.  No answer.  LVM to return call. 

## 2022-08-05 ENCOUNTER — Telehealth: Payer: Self-pay | Admitting: Emergency Medicine

## 2022-08-05 NOTE — Telephone Encounter (Signed)
ATC and line was busy.

## 2022-08-05 NOTE — Telephone Encounter (Signed)
Patient is returning phone call. Patient phone number is [phone].

## 2022-08-06 NOTE — Telephone Encounter (Signed)
LM x 2

## 2022-08-09 ENCOUNTER — Ambulatory Visit: Payer: Medicare Other

## 2022-08-18 ENCOUNTER — Encounter: Payer: Self-pay | Admitting: Obstetrics & Gynecology

## 2022-08-19 ENCOUNTER — Other Ambulatory Visit: Payer: Self-pay | Admitting: Obstetrics & Gynecology

## 2022-08-19 DIAGNOSIS — Z853 Personal history of malignant neoplasm of breast: Secondary | ICD-10-CM

## 2022-08-19 DIAGNOSIS — K080 Exfoliation of teeth due to systemic causes: Secondary | ICD-10-CM | POA: Diagnosis not present

## 2022-08-20 ENCOUNTER — Ambulatory Visit: Payer: Medicare Other | Admitting: Emergency Medicine

## 2022-08-20 ENCOUNTER — Encounter: Payer: Self-pay | Admitting: Emergency Medicine

## 2022-08-20 VITALS — BP 120/76 | HR 111 | Ht 65.0 in | Wt 165.6 lb

## 2022-08-20 DIAGNOSIS — J4520 Mild intermittent asthma, uncomplicated: Secondary | ICD-10-CM | POA: Diagnosis not present

## 2022-08-20 DIAGNOSIS — R0609 Other forms of dyspnea: Secondary | ICD-10-CM

## 2022-08-20 DIAGNOSIS — R053 Chronic cough: Secondary | ICD-10-CM | POA: Diagnosis not present

## 2022-08-20 NOTE — Assessment & Plan Note (Signed)
Multifactorial dyspnea.  Her pulmonary function testing is overall stable with the exception of decreased diffusion capacity.  This may be explained by more labile CHF symptoms.  She has been using her Lasix more frequently and notices a direct correlation between its use and an improvement in her breathing.  She has not been requiring any albuterol, has not had wheeze, cough, etc.  Her last echocardiogram was in November, looks like her EF was stable 50-55%.  She has follow-up soon with Dr. Percival Spanish, question whether she may end up on scheduled diuretics instead of Lasix as needed.  We will perform walking oximetry today to ensure no evidence of occult desaturation, indication for supplemental oxygen.

## 2022-08-20 NOTE — Assessment & Plan Note (Signed)
Stable at this time.  Has not been bothersome.

## 2022-08-20 NOTE — Assessment & Plan Note (Signed)
Possible mild intermittent asthma based on clinical history and response in the past albuterol.  Her pulmonary function testing does not show any evidence of obstruction.  She has albuterol to use as needed.  Not on any controller medication

## 2022-08-20 NOTE — Progress Notes (Signed)
   Subjective:    Patient ID: [name], female    DOB: 07/14/1955, 67 y.o.   MRN: [id number]  HPI  ROV 08/20/2022 --follow-up visit for 67 year old woman with a history of breast cancer and autologous stem cell transplant, complicated by pneumonitis from chemotherapy and a nonischemic cardiomyopathy.  She also has a history of mild intermittent asthma although pulmonary function testing has not shown any overt obstruction.  She has dealt with episodic exertional dyspnea and cough.  Last seen in our office 04/2022. Today she reports that she has been having more exertional SOB. She has noted that it seems to respond to her lasix, which she takes prn, but more recently more frequently. Has been occurring last 2 months. No cough. No wheeze. Has not needed her albuterol. She is on flonase, loratdine.   CT chest 05/13/2022 reviewed by me, shows some bandlike scarring and volume loss in the medial right lung, question from prior inflammation or infection.  Pulmonary function testing 07/21/2022 reviewed by me shows grossly normal airflows without a bronchodilator response, normal lung volumes, slightly decreased diffusion capacity that does not fully correct in the normal range when adjusted for alveolar volume     Objective:   Physical Exam Vitals:   08/20/22 1539  BP: 120/76  Pulse: (!) 111  SpO2: 97%  Weight: 165 lb 9.6 oz (75.1 kg)  Height: 5' 5"$  (1.651 m)    Gen: Pleasant,well-nourished, in no distress,  normal affect  ENT: No lesions,  mouth clear,  oropharynx clear, no postnasal drip  Neck: No JVD, no stridor  Lungs: No use of accessory muscles, clear without rales or rhonchi  Cardiovascular: RRR, heart sounds normal, no murmur or gallops, no peripheral edema  Musculoskeletal: No deformities, no cyanosis or clubbing  Neuro: alert, non focal  Skin: Warm, no lesions or rashes      Assessment & Plan:  Chronic cough Stable at this time.  Has not been bothersome.  Dyspnea  on exertion Multifactorial dyspnea.  Her pulmonary function testing is overall stable with the exception of decreased diffusion capacity.  This may be explained by more labile CHF symptoms.  She has been using her Lasix more frequently and notices a direct correlation between its use and an improvement in her breathing.  She has not been requiring any albuterol, has not had wheeze, cough, etc.  Her last echocardiogram was in November, looks like her EF was stable 50-55%.  She has follow-up soon with Dr. Percival Spanish, question whether she may end up on scheduled diuretics instead of Lasix as needed.  We will perform walking oximetry today to ensure no evidence of occult desaturation, indication for supplemental oxygen.  Asthma Possible mild intermittent asthma based on clinical history and response in the past albuterol.  Her pulmonary function testing does not show any evidence of obstruction.  She has albuterol to use as needed.  Not on any controller medication     Baltazar Apo, MD, PhD 08/20/2022, 4:18 PM [facility] [phone] or if no answer [phone]

## 2022-08-20 NOTE — Patient Instructions (Signed)
We reviewed your most recent pulmonary function testing today. Please continue to have your albuterol available use 2 puffs if needed for shortness of breath, chest tightness, wheezing. Agree with follow-up with Dr. Percival Spanish.  He may decide to adjust your Lasix dosing. We will perform walking oximetry today on room air Follow with Dr. Lamonte Sakai in 12 months or sooner if you have any problems.

## 2022-08-22 DIAGNOSIS — I700 Atherosclerosis of aorta: Secondary | ICD-10-CM | POA: Insufficient documentation
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
## 2022-08-23 ENCOUNTER — Encounter: Payer: Self-pay | Admitting: Cardiology

## 2022-08-23 ENCOUNTER — Ambulatory Visit: Payer: Medicare Other | Attending: Cardiology | Admitting: Cardiology

## 2022-08-23 VITALS — BP 130/78 | HR 80 | Ht 65.0 in | Wt 161.0 lb

## 2022-08-23 DIAGNOSIS — I428 Other cardiomyopathies: Secondary | ICD-10-CM | POA: Diagnosis not present

## 2022-08-23 DIAGNOSIS — I2510 Atherosclerotic heart disease of native coronary artery without angina pectoris: Secondary | ICD-10-CM

## 2022-08-23 DIAGNOSIS — Q249 Congenital malformation of heart, unspecified: Secondary | ICD-10-CM

## 2022-08-23 DIAGNOSIS — I700 Atherosclerosis of aorta: Secondary | ICD-10-CM | POA: Diagnosis not present

## 2022-08-23 MED ORDER — ROSUVASTATIN CALCIUM 20 MG PO TABS: 20.0000 mg | ORAL_TABLET | Freq: Every day | ORAL | 3 refills | Status: DC

## 2022-08-23 NOTE — Patient Instructions (Signed)
Medication Instructions:   START ROSUVASTATIN 20 MG ONCE DAILY  *If you need a refill on your cardiac medications before your next appointment, please call your pharmacy*   Lab Work:  Your physician recommends that you return for lab work in: 3 MONTHS-FASTING  If you have labs (blood work) drawn today and your tests are completely normal, you will receive your results only by: [place] (if you have MyChart) OR A paper copy in the mail If you have any lab test that is abnormal or we need to change your treatment, we will call you to review the results.   Testing/Procedures:  Your physician has requested that you have an exercise tolerance test. For further information please visit [email or web address]. Please also follow instruction sheet, as given. [place], you and your health needs are our priority.  As part of our continuing mission to provide you with exceptional heart care, we have created designated Provider Care Teams.  These Care Teams include your primary Cardiologist (physician) and Advanced Practice Providers (APPs -  Physician Assistants and Nurse Practitioners) who all work together to provide you with the care you need, when you need it.  We recommend signing up for the patient portal called "MyChart".  Sign up information is provided on this After Visit Summary.  MyChart is used to connect with patients for Virtual Visits (Telemedicine).  Patients are able to view lab/test results, encounter notes, upcoming appointments, etc.  Non-urgent messages can be sent to your provider as well.   To learn more about what you can do with MyChart, go to [email or web address].    Your next appointment:   6 month(s)  Provider:   Minus Breeding, MD

## 2022-08-25 ENCOUNTER — Ambulatory Visit: Payer: Medicare Other | Attending: Cardiology

## 2022-08-25 DIAGNOSIS — I700 Atherosclerosis of aorta: Secondary | ICD-10-CM | POA: Diagnosis not present

## 2022-08-25 LAB — EXERCISE TOLERANCE TEST
Angina Index: 0
Duke Treadmill Score: 4
Estimated workload: 5.3
Exercise duration (min): 4 min
Exercise duration (sec): 8 s
MPHR: 155 {beats}/min
Peak HR: 137 {beats}/min
Percent HR: 88 %
RPE: 17
Rest HR: 63 {beats}/min
ST Depression (mm): 0 mm

## 2022-08-27 ENCOUNTER — Telehealth: Payer: Self-pay | Admitting: Cardiology

## 2022-08-27 NOTE — Telephone Encounter (Signed)
Called patient, discussed results:  Christina Breeding, MD 08/26/2022  9:22 PM EST     Negative POET (Plain Old Exercise Treadmill).  No change in therapy.  No clear etiology to the dyspnea identified.  Call [name] with the results and send results to Mosie Lukes, MD     She was concerned with her BP increase- however we dicussed this was during stress, patient now understands. All questions answered.

## 2022-08-27 NOTE — Telephone Encounter (Signed)
Pt calling to go over stress test results

## 2022-08-30 DIAGNOSIS — M25551 Pain in right hip: Secondary | ICD-10-CM | POA: Diagnosis not present

## 2022-08-30 DIAGNOSIS — G8929 Other chronic pain: Secondary | ICD-10-CM | POA: Diagnosis not present

## 2022-08-30 DIAGNOSIS — Z9889 Other specified postprocedural states: Secondary | ICD-10-CM | POA: Diagnosis not present

## 2022-09-01 NOTE — Telephone Encounter (Signed)
Patient was seen by Dr. Lamonte Sakai on 02/23. Will close encounter.

## 2022-09-03 ENCOUNTER — Encounter: Payer: Self-pay | Admitting: Cardiology

## 2022-09-03 ENCOUNTER — Encounter: Payer: Self-pay | Admitting: Emergency Medicine

## 2022-09-03 DIAGNOSIS — I420 Dilated cardiomyopathy: Secondary | ICD-10-CM

## 2022-09-03 DIAGNOSIS — I2510 Atherosclerotic heart disease of native coronary artery without angina pectoris: Secondary | ICD-10-CM

## 2022-09-03 DIAGNOSIS — R0602 Shortness of breath: Secondary | ICD-10-CM

## 2022-09-03 DIAGNOSIS — Q249 Congenital malformation of heart, unspecified: Secondary | ICD-10-CM

## 2022-09-03 DIAGNOSIS — I428 Other cardiomyopathies: Secondary | ICD-10-CM

## 2022-09-03 NOTE — Telephone Encounter (Signed)
Please let the patient know that I made the referral for her.

## 2022-09-03 NOTE — Telephone Encounter (Signed)
Received the following message from patient:   "I am seeking a 2nd opinion at [facility] and need a referral from you. Their FAX # IS [phone]) [id number]. Please include my [facility] MRN [id number]. Thank you for your assistance."  Dr. Lamonte Sakai, please advise if you are ok with [place] placing a referral for patient. Thanks!

## 2022-09-07 NOTE — Addendum Note (Signed)
Addended by: Cristopher Estimable on: 09/07/2022 08:31 AM   Modules accepted: Orders

## 2022-09-14 DIAGNOSIS — Z85820 Personal history of malignant melanoma of skin: Secondary | ICD-10-CM | POA: Diagnosis not present

## 2022-09-14 DIAGNOSIS — L820 Inflamed seborrheic keratosis: Secondary | ICD-10-CM | POA: Diagnosis not present

## 2022-09-14 DIAGNOSIS — Z08 Encounter for follow-up examination after completed treatment for malignant neoplasm: Secondary | ICD-10-CM | POA: Diagnosis not present

## 2022-09-14 DIAGNOSIS — Z1283 Encounter for screening for malignant neoplasm of skin: Secondary | ICD-10-CM | POA: Diagnosis not present

## 2022-09-14 DIAGNOSIS — D225 Melanocytic nevi of trunk: Secondary | ICD-10-CM | POA: Diagnosis not present

## 2022-09-16 ENCOUNTER — Ambulatory Visit
Admission: RE | Admit: 2022-09-16 | Discharge: 2022-09-16 | Disposition: A | Discharge status: Home / Self Care | Payer: Medicare Other | Source: Ambulatory Visit | Attending: Obstetrics & Gynecology | Admitting: Obstetrics & Gynecology

## 2022-09-16 DIAGNOSIS — R922 Inconclusive mammogram: Secondary | ICD-10-CM | POA: Diagnosis not present

## 2022-09-16 DIAGNOSIS — Z853 Personal history of malignant neoplasm of breast: Secondary | ICD-10-CM | POA: Diagnosis not present

## 2022-09-16 MED ORDER — GADOPICLENOL 0.5 MMOL/ML IV SOLN
8.0000 mL | Freq: Once | INTRAVENOUS | Status: AC | PRN
  Administered 2022-09-16: 8 mL via INTRAVENOUS

## 2022-10-05 DIAGNOSIS — M792 Neuralgia and neuritis, unspecified: Secondary | ICD-10-CM | POA: Diagnosis not present

## 2022-10-08 DIAGNOSIS — F419 Anxiety disorder, unspecified: Secondary | ICD-10-CM | POA: Diagnosis not present

## 2022-10-08 DIAGNOSIS — Q261 Persistent left superior vena cava: Secondary | ICD-10-CM | POA: Diagnosis not present

## 2022-10-08 DIAGNOSIS — R002 Palpitations: Secondary | ICD-10-CM | POA: Diagnosis not present

## 2022-10-08 DIAGNOSIS — I428 Other cardiomyopathies: Secondary | ICD-10-CM | POA: Diagnosis not present

## 2022-10-08 DIAGNOSIS — I739 Peripheral vascular disease, unspecified: Secondary | ICD-10-CM | POA: Diagnosis not present

## 2022-10-08 DIAGNOSIS — R0609 Other forms of dyspnea: Secondary | ICD-10-CM | POA: Diagnosis not present

## 2022-10-08 DIAGNOSIS — E032 Hypothyroidism due to medicaments and other exogenous substances: Secondary | ICD-10-CM | POA: Diagnosis not present

## 2022-10-08 DIAGNOSIS — I209 Angina pectoris, unspecified: Secondary | ICD-10-CM | POA: Diagnosis not present

## 2022-10-08 DIAGNOSIS — Z9481 Bone marrow transplant status: Secondary | ICD-10-CM | POA: Diagnosis not present

## 2022-10-20 DIAGNOSIS — I2510 Atherosclerotic heart disease of native coronary artery without angina pectoris: Secondary | ICD-10-CM | POA: Diagnosis not present

## 2022-10-20 DIAGNOSIS — E032 Hypothyroidism due to medicaments and other exogenous substances: Secondary | ICD-10-CM | POA: Diagnosis not present

## 2022-10-20 DIAGNOSIS — Q261 Persistent left superior vena cava: Secondary | ICD-10-CM | POA: Diagnosis not present

## 2022-10-20 DIAGNOSIS — I428 Other cardiomyopathies: Secondary | ICD-10-CM | POA: Diagnosis not present

## 2022-10-20 DIAGNOSIS — F419 Anxiety disorder, unspecified: Secondary | ICD-10-CM | POA: Diagnosis not present

## 2022-10-20 DIAGNOSIS — Z136 Encounter for screening for cardiovascular disorders: Secondary | ICD-10-CM | POA: Diagnosis not present

## 2022-10-20 DIAGNOSIS — R002 Palpitations: Secondary | ICD-10-CM | POA: Diagnosis not present

## 2022-10-20 DIAGNOSIS — I739 Peripheral vascular disease, unspecified: Secondary | ICD-10-CM | POA: Diagnosis not present

## 2022-10-20 DIAGNOSIS — I25119 Atherosclerotic heart disease of native coronary artery with unspecified angina pectoris: Secondary | ICD-10-CM | POA: Diagnosis not present

## 2022-10-21 ENCOUNTER — Other Ambulatory Visit: Payer: Self-pay | Admitting: Family

## 2022-10-21 MED ORDER — FUROSEMIDE 40 MG PO TABS: 40.0000 mg | ORAL_TABLET | Freq: Every day | ORAL | 0 refills | Status: DC | PRN

## 2022-10-27 DIAGNOSIS — H52203 Unspecified astigmatism, bilateral: Secondary | ICD-10-CM | POA: Diagnosis not present

## 2022-10-27 NOTE — Assessment & Plan Note (Signed)
hgba1c acceptable, minimize simple carbs. Increase exercise as tolerated.  

## 2022-10-27 NOTE — Assessment & Plan Note (Signed)
No recent exacerbation 

## 2022-10-27 NOTE — Assessment & Plan Note (Signed)
Encourage heart healthy diet such as MIND or DASH diet, increase exercise, avoid trans fats, simple carbohydrates and processed foods, consider a krill or fish or flaxseed oil cap daily.  °

## 2022-10-27 NOTE — Assessment & Plan Note (Signed)
Hydrate and monitor 

## 2022-10-27 NOTE — Assessment & Plan Note (Signed)
Does not tolerate statins.

## 2022-10-27 NOTE — Assessment & Plan Note (Signed)
Bone density shows osteopenia, which is thinner than normal but not as bad as osteoporosis. Recommend calcium intake of 1200 to 1500 mg daily, divided into roughly 3 doses. Best source is the diet and a single dairy serving is about 500 mg, a supplement of calcium citrate once or twice daily to balance diet is fine if not getting enough in diet. Also need Vitamin D 2000 IU caps, 1 cap daily if not already taking vitamin D. Also recommend weight baring exercise on hips and upper body to keep bones strong  

## 2022-10-27 NOTE — Assessment & Plan Note (Signed)
Supplement and monitor 

## 2022-10-27 NOTE — Assessment & Plan Note (Signed)
On Levothyroxine, continue to monitor 

## 2022-10-28 ENCOUNTER — Ambulatory Visit (INDEPENDENT_AMBULATORY_CARE_PROVIDER_SITE_OTHER): Payer: Medicare Other | Admitting: Family Medicine

## 2022-10-28 VITALS — BP 128/74 | HR 62 | Temp 97.5°F | Resp 16 | Ht 65.0 in | Wt 162.4 lb

## 2022-10-28 DIAGNOSIS — R739 Hyperglycemia, unspecified: Secondary | ICD-10-CM | POA: Diagnosis not present

## 2022-10-28 DIAGNOSIS — M8580 Other specified disorders of bone density and structure, unspecified site: Secondary | ICD-10-CM

## 2022-10-28 DIAGNOSIS — E039 Hypothyroidism, unspecified: Secondary | ICD-10-CM

## 2022-10-28 DIAGNOSIS — N189 Chronic kidney disease, unspecified: Secondary | ICD-10-CM

## 2022-10-28 DIAGNOSIS — F418 Other specified anxiety disorders: Secondary | ICD-10-CM

## 2022-10-28 DIAGNOSIS — E559 Vitamin D deficiency, unspecified: Secondary | ICD-10-CM

## 2022-10-28 DIAGNOSIS — G720 Drug-induced myopathy: Secondary | ICD-10-CM

## 2022-10-28 DIAGNOSIS — E785 Hyperlipidemia, unspecified: Secondary | ICD-10-CM

## 2022-10-28 DIAGNOSIS — J4520 Mild intermittent asthma, uncomplicated: Secondary | ICD-10-CM

## 2022-10-28 DIAGNOSIS — R252 Cramp and spasm: Secondary | ICD-10-CM

## 2022-10-28 DIAGNOSIS — K5900 Constipation, unspecified: Secondary | ICD-10-CM

## 2022-10-28 DIAGNOSIS — G4700 Insomnia, unspecified: Secondary | ICD-10-CM

## 2022-10-28 DIAGNOSIS — Z79899 Other long term (current) drug therapy: Secondary | ICD-10-CM

## 2022-10-28 MED ORDER — HYDROXYZINE HCL 10 MG PO TABS: 10.0000 mg | ORAL_TABLET | Freq: Three times a day (TID) | ORAL | 2 refills | Status: DC | PRN

## 2022-10-28 MED ORDER — FLUOXETINE HCL 10 MG PO TABS: 10.0000 mg | ORAL_TABLET | Freq: Every day | ORAL | 3 refills | Status: DC

## 2022-10-28 NOTE — Patient Instructions (Addendum)
IBGard  Probiotics  Miralax with Benefiber daily Hydrate well    Physical Activity With Heart Disease Being active has many benefits, especially if you have heart disease. Physical activity can help you do more and feel healthier. Start slowly, and increase the amount of time you spend being active. You should aim for physical activity that: Makes you breathe harder and raises your heart rate (aerobic activity). Try to get at least 150 minutes of aerobic activity each week. This is about 30 minutes each day, 5 days a week. Helps build muscle strength (strengthening activity). Do this at least 2 times a week. A good rule of thumb is to work hard enough to breathe harder but still be able to carry on a conversation. If you can sing, you may not be working hard enough. You may also want to monitor your heart rate (pulse) and blood pressure. Ask your health care provider what kind of tools you will need to track these. Always talk with your health care provider before starting any new activity program or if you have any changes in your condition. What are the benefits of physical activity? Physical activity can help improve your heart and blood vessel (cardiovascular) health. It can: Lower your blood pressure. Lower your cholesterol. Control your weight. Help control your blood sugar. Improve the function of your heart and lungs. Reduce your risk of developing blood clots. Physical activity can help improve other aspects of your health. It can: Prevent bone loss. Improve your sleep. Improve your energy level. Reduce stress. What are some types of physical activity I could try? There are many ways to be active. Talk with your health care provider about what types and intensity of activity is right for you. Aerobic activity  Aerobic (cardiovascular) activity can be moderate or vigorous intensity, depending on how hard you are working. Moderate-intensity activity includes: Walking. Slow  bicycling. Water aerobics. Dancing. Light gardening or house work. Vigorous-intensity activity includes: Jogging or running. Stair climbing. Swimming laps. Hiking uphill. Heavy gardening, such as digging trenches.  Strengthening activity Strengthening activities work your muscles to build strength. Some examples include: Doing push-ups, sit-ups, or pull-ups. Lifting small weights. Using resistance bands. Yoga.  Flexibility Flexibility activities lengthen your muscles to keep them flexible and less tight and improve your balance. Some examples include: Stretching. Yoga. Tai chi. Erenest Rasher barre.  Follow these instructions at home: How to get started Talk with your health care provider about: What types of activities are safe for you. If you should check your pulse or take other precautions during physical activity. Get a calendar. Write down a schedule and plan for your new routine. At the start of your workout, as well as at the end, remember to warm up and cool down to allow a gradual increase or decrease in heart rate and breathing. If you have not been active, begin with sessions that last 10-15 minutes. Gradually work up to sessions that last 20-30 minutes, 5 times a week. Follow all of your health care provider's recommendations. Take time to find out what works for you. Consider the following: Join a community program, such as a biking group, yoga class, local gym, or swimming pool membership. Be active on your own by downloading free workout applications on a smartphone or other devices, or by purchasing workout DVDs. Be patient with yourself. It takes time to build up strength and lung capacity. Safety Exercise in an indoor, climate-controlled facility, as told by your health care provider. You may need  to do this if: There are extreme outdoor conditions, such as heat, humidity, or cold. There is an air pollution advisory. Your local news, board of health, or hospital can  provide information on air quality. Take extra precautions as told by your health care provider. This may include: Monitoring your heart rate. Avoiding heavy lifting. Understanding how your medicines can affect you during physical activity. Certain medicines may cause heat intolerance or changes in blood sugar. Slowing down to rest when you need to. Keeping nitroglycerin spray and tablets with you at all times if you have angina. Use them as told to prevent and treat symptoms. Drink plenty of water before, during, and after physical activity. Know what symptoms may be signs of a problem and stop physical activity right away if you have any of those symptoms. Where to find more information American Heart Association: www.heart.org [place] Department of Health and Human Services: SixMonthFoodSupply.at Get help right away if you have any of the following during exercise: Chest pain, shortness of breath, or feel very tired. Pain in the arm, shoulder, neck, or jaw. You feel weak, dizzy, or light-headed. An irregular heart rate, or your heart rate is greater than 100 beats per minute (bpm) before exercise. These symptoms may represent a serious problem that is an emergency. Do not wait to see if the symptoms go away. Get medical help right away. Call your local emergency services (911 in the [place] Do not drive yourself to the hospital.  Summary Physical activity has many benefits, especially if you have heart disease. Before starting an activity program, talk with your health care provider about how often to be active and what type of activity is safe for you. Your physical activity plan may include moderate or vigorous aerobic activity, strengthening activities, and flexibility. Know what symptoms may be signs of a problem. Stop physical activity right away and call emergency services (911 in the [place] if you have any of those symptoms. This information is not intended to replace advice given to you by your  health care provider. Make sure you discuss any questions you have with your health care provider. Document Revised: 11/25/2020 Document Reviewed: 11/25/2020 Elsevier Patient Education  2023 [employer].
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
## 2022-10-29 DIAGNOSIS — R252 Cramp and spasm: Secondary | ICD-10-CM | POA: Insufficient documentation

## 2022-10-29 LAB — LIPID PANEL
Cholesterol: 214 mg/dL — ABNORMAL HIGH (ref 0–200)
HDL: 53.8 mg/dL (ref >39.00)
LDL Cholesterol: 144 mg/dL — ABNORMAL HIGH (ref 0–99)
NonHDL: 160
Total CHOL/HDL Ratio: 4
Triglycerides: 80 mg/dL (ref 0.0–149.0)
VLDL: 16 mg/dL (ref 0.0–40.0)

## 2022-10-29 LAB — MAGNESIUM: Magnesium: 2 mg/dL (ref 1.5–2.5)

## 2022-10-29 LAB — CBC WITH DIFFERENTIAL/PLATELET
Basophils Absolute: 0.1 10*3/uL (ref 0.0–0.1)
Basophils Relative: 1.1 % (ref 0.0–3.0)
Eosinophils Absolute: 0.1 10*3/uL (ref 0.0–0.7)
Eosinophils Relative: 0.7 % (ref 0.0–5.0)
HCT: 43.5 % (ref 36.0–46.0)
Hemoglobin: 14.6 g/dL (ref 12.0–15.0)
Lymphocytes Relative: 23.4 % (ref 12.0–46.0)
Lymphs Abs: 2 10*3/uL (ref 0.7–4.0)
MCHC: 33.5 g/dL (ref 30.0–36.0)
MCV: 96.2 fl (ref 78.0–100.0)
Monocytes Absolute: 0.7 10*3/uL (ref 0.1–1.0)
Monocytes Relative: 8.6 % (ref 3.0–12.0)
Neutro Abs: 5.5 10*3/uL (ref 1.4–7.7)
Neutrophils Relative %: 66.2 % (ref 43.0–77.0)
Platelets: 275 10*3/uL (ref 150.0–400.0)
RBC: 4.53 Mil/uL (ref 3.87–5.11)
RDW: 13 % (ref 11.5–15.5)
WBC: 8.4 10*3/uL (ref 4.0–10.5)

## 2022-10-29 LAB — HEMOGLOBIN A1C: Hgb A1c MFr Bld: 5.9 % (ref 4.6–6.5)

## 2022-10-29 LAB — COMPREHENSIVE METABOLIC PANEL
ALT: 15 U/L (ref 0–35)
AST: 23 U/L (ref 0–37)
Albumin: 4.4 g/dL (ref 3.5–5.2)
Alkaline Phosphatase: 95 U/L (ref 39–117)
BUN: 18 mg/dL (ref 6–23)
CO2: 32 mEq/L (ref 19–32)
Calcium: 9.4 mg/dL (ref 8.4–10.5)
Chloride: 95 mEq/L — ABNORMAL LOW (ref 96–112)
Creatinine, Ser: 0.95 mg/dL (ref 0.40–1.20)
GFR: 62.83 mL/min (ref >60.00)
Glucose, Bld: 90 mg/dL (ref 70–99)
Potassium: 3.9 mEq/L (ref 3.5–5.1)
Sodium: 138 mEq/L (ref 135–145)
Total Bilirubin: 0.7 mg/dL (ref 0.2–1.2)
Total Protein: 7 g/dL (ref 6.0–8.3)

## 2022-10-29 LAB — TSH: TSH: 1.36 u[IU]/mL (ref 0.35–5.50)

## 2022-10-29 LAB — VITAMIN D 25 HYDROXY (VIT D DEFICIENCY, FRACTURES): VITD: 43.21 ng/mL (ref 30.00–100.00)

## 2022-10-29 NOTE — Assessment & Plan Note (Signed)
Encouraged good sleep hygiene such as dark, quiet room. No blue/green glowing lights such as computer screens in bedroom. No alcohol or stimulants in evening. Cut down on caffeine as able. Regular exercise is helpful but not just prior to bed time.  Hydroxyzine prn

## 2022-10-29 NOTE — Assessment & Plan Note (Signed)
She has been using MOM and prune juice every 3 days as needed but continues to struggle. Add Benefiber to Miralax and use daily can consider increasing to bid

## 2022-10-29 NOTE — Assessment & Plan Note (Signed)
Supplement and monitor 

## 2022-10-29 NOTE — Assessment & Plan Note (Signed)
Continues to struggle with stress and is managing on Wellbutrin but will add Fluoxetine 10 mg daily. She has stopped the Alprazolam but is struggling with anxiety and sleep. Will try Hydroxyzine 10 mg tid prn

## 2022-11-02 DIAGNOSIS — Q261 Persistent left superior vena cava: Secondary | ICD-10-CM | POA: Diagnosis not present

## 2022-11-09 ENCOUNTER — Encounter: Payer: Self-pay | Admitting: Family Medicine

## 2022-11-10 DIAGNOSIS — R9439 Abnormal result of other cardiovascular function study: Secondary | ICD-10-CM | POA: Diagnosis not present

## 2022-11-10 DIAGNOSIS — I25118 Atherosclerotic heart disease of native coronary artery with other forms of angina pectoris: Secondary | ICD-10-CM | POA: Diagnosis not present

## 2022-11-10 DIAGNOSIS — I428 Other cardiomyopathies: Secondary | ICD-10-CM | POA: Diagnosis not present

## 2022-11-10 DIAGNOSIS — I739 Peripheral vascular disease, unspecified: Secondary | ICD-10-CM | POA: Diagnosis not present

## 2022-11-10 DIAGNOSIS — I2089 Other forms of angina pectoris: Secondary | ICD-10-CM | POA: Diagnosis not present

## 2022-11-10 DIAGNOSIS — E785 Hyperlipidemia, unspecified: Secondary | ICD-10-CM | POA: Diagnosis not present

## 2022-11-10 DIAGNOSIS — Z7982 Long term (current) use of aspirin: Secondary | ICD-10-CM | POA: Diagnosis not present

## 2022-11-10 DIAGNOSIS — Q261 Persistent left superior vena cava: Secondary | ICD-10-CM | POA: Diagnosis not present

## 2022-11-10 DIAGNOSIS — Z79899 Other long term (current) drug therapy: Secondary | ICD-10-CM | POA: Diagnosis not present

## 2022-11-10 DIAGNOSIS — T451X5A Adverse effect of antineoplastic and immunosuppressive drugs, initial encounter: Secondary | ICD-10-CM | POA: Diagnosis not present

## 2022-11-10 DIAGNOSIS — Z9221 Personal history of antineoplastic chemotherapy: Secondary | ICD-10-CM | POA: Diagnosis not present

## 2022-11-10 DIAGNOSIS — R0609 Other forms of dyspnea: Secondary | ICD-10-CM | POA: Diagnosis not present

## 2022-11-10 DIAGNOSIS — I25110 Atherosclerotic heart disease of native coronary artery with unstable angina pectoris: Secondary | ICD-10-CM | POA: Diagnosis not present

## 2022-11-10 DIAGNOSIS — R0602 Shortness of breath: Secondary | ICD-10-CM | POA: Diagnosis not present

## 2022-11-11 ENCOUNTER — Encounter: Payer: Self-pay | Admitting: Family Medicine

## 2022-11-11 DIAGNOSIS — I25110 Atherosclerotic heart disease of native coronary artery with unstable angina pectoris: Secondary | ICD-10-CM | POA: Diagnosis not present

## 2022-11-11 DIAGNOSIS — I739 Peripheral vascular disease, unspecified: Secondary | ICD-10-CM | POA: Diagnosis not present

## 2022-11-11 DIAGNOSIS — I2089 Other forms of angina pectoris: Secondary | ICD-10-CM | POA: Diagnosis not present

## 2022-11-11 DIAGNOSIS — Z7982 Long term (current) use of aspirin: Secondary | ICD-10-CM | POA: Diagnosis not present

## 2022-11-11 DIAGNOSIS — Q261 Persistent left superior vena cava: Secondary | ICD-10-CM | POA: Diagnosis not present

## 2022-11-11 DIAGNOSIS — I428 Other cardiomyopathies: Secondary | ICD-10-CM | POA: Diagnosis not present

## 2022-11-11 DIAGNOSIS — E785 Hyperlipidemia, unspecified: Secondary | ICD-10-CM | POA: Diagnosis not present

## 2022-11-11 DIAGNOSIS — Z9221 Personal history of antineoplastic chemotherapy: Secondary | ICD-10-CM | POA: Diagnosis not present

## 2022-11-11 DIAGNOSIS — Z79899 Other long term (current) drug therapy: Secondary | ICD-10-CM | POA: Diagnosis not present

## 2022-11-11 DIAGNOSIS — I25118 Atherosclerotic heart disease of native coronary artery with other forms of angina pectoris: Secondary | ICD-10-CM | POA: Diagnosis not present

## 2022-11-15 ENCOUNTER — Telehealth (HOSPITAL_COMMUNITY): Payer: Self-pay

## 2022-11-15 NOTE — Telephone Encounter (Signed)
Left pt voicemail. We received a referral for Rehab and was just following up to see if she was interested in participating in our program.

## 2022-11-16 ENCOUNTER — Encounter (HOSPITAL_COMMUNITY): Payer: Self-pay

## 2022-11-16 ENCOUNTER — Telehealth (HOSPITAL_COMMUNITY): Payer: Self-pay

## 2022-11-16 NOTE — Telephone Encounter (Signed)
Outside/paper referral received by Dr. Okey Dupre from [facility]. Will fax over Physician order and request further documents. Insurance benefits and eligibility to be determined.   Mailed pt a letter to contact [facility].

## 2022-11-21 DIAGNOSIS — I428 Other cardiomyopathies: Secondary | ICD-10-CM | POA: Diagnosis not present

## 2022-11-21 DIAGNOSIS — R002 Palpitations: Secondary | ICD-10-CM | POA: Diagnosis not present

## 2022-11-21 DIAGNOSIS — I739 Peripheral vascular disease, unspecified: Secondary | ICD-10-CM | POA: Diagnosis not present

## 2022-11-21 DIAGNOSIS — Q261 Persistent left superior vena cava: Secondary | ICD-10-CM | POA: Diagnosis not present

## 2022-12-14 ENCOUNTER — Telehealth (HOSPITAL_COMMUNITY): Payer: Self-pay

## 2022-12-14 NOTE — Telephone Encounter (Signed)
No response from pt.  Closed referral (Cardiac rehab)  

## 2022-12-24 ENCOUNTER — Telehealth: Payer: Self-pay | Admitting: *Deleted

## 2022-12-24 NOTE — Telephone Encounter (Signed)
Spoke with pt, she reports she stopped the crestor because of muscle aches. She is currently taking red yeast rice and is not interested in taking any other statin and she will discuss meds with her PCP. She is not interested in seeing the pharmacist.

## 2022-12-24 NOTE — Telephone Encounter (Signed)
Left message for pt to call, she had lipid profile 10/28/22 with her pcp. Her LDL is 144. Dr Antoine Poche has reviewed and would like the patient to increase rosuvastatin to 20 mg once daily. With a repeat lipid panel in 3 months.

## 2023-01-05 DIAGNOSIS — H0015 Chalazion left lower eyelid: Secondary | ICD-10-CM | POA: Diagnosis not present

## 2023-01-05 DIAGNOSIS — H5712 Ocular pain, left eye: Secondary | ICD-10-CM | POA: Diagnosis not present

## 2023-01-09 NOTE — Assessment & Plan Note (Addendum)
Does not tolerate statins, failed Atorvasatatin and Rosuvastatin. She is encouraged to f/u with cardiology and their pharmacy to try a different approach to cholesterol management

## 2023-01-09 NOTE — Assessment & Plan Note (Signed)
hgba1c acceptable, minimize simple carbs. Increase exercise as tolerated.  

## 2023-01-09 NOTE — Assessment & Plan Note (Signed)
Encouraged to get adequate exercise, calcium and vitamin d intake 

## 2023-01-09 NOTE — Assessment & Plan Note (Signed)
Hydrate and monitor 

## 2023-01-09 NOTE — Assessment & Plan Note (Signed)
Following with cardiology. 

## 2023-01-09 NOTE — Assessment & Plan Note (Signed)
Encourage heart healthy diet such as MIND or DASH diet, increase exercise, avoid trans fats, simple carbohydrates and processed foods, consider a krill or fish or flaxseed oil cap daily.  °

## 2023-01-09 NOTE — Assessment & Plan Note (Signed)
On Levothyroxine, continue to monitor 
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
## 2023-01-09 NOTE — Assessment & Plan Note (Signed)
Supplement and monitor 

## 2023-01-10 ENCOUNTER — Encounter: Payer: Self-pay | Admitting: Family Medicine

## 2023-01-10 ENCOUNTER — Ambulatory Visit (INDEPENDENT_AMBULATORY_CARE_PROVIDER_SITE_OTHER): Payer: Medicare Other | Admitting: Family Medicine

## 2023-01-10 VITALS — BP 130/78 | HR 99 | Temp 97.7°F | Resp 16 | Ht 65.0 in | Wt 165.6 lb

## 2023-01-10 DIAGNOSIS — I25111 Atherosclerotic heart disease of native coronary artery with angina pectoris with documented spasm: Secondary | ICD-10-CM

## 2023-01-10 DIAGNOSIS — E039 Hypothyroidism, unspecified: Secondary | ICD-10-CM

## 2023-01-10 DIAGNOSIS — E559 Vitamin D deficiency, unspecified: Secondary | ICD-10-CM

## 2023-01-10 DIAGNOSIS — R252 Cramp and spasm: Secondary | ICD-10-CM

## 2023-01-10 DIAGNOSIS — T466X5A Adverse effect of antihyperlipidemic and antiarteriosclerotic drugs, initial encounter: Secondary | ICD-10-CM

## 2023-01-10 DIAGNOSIS — R739 Hyperglycemia, unspecified: Secondary | ICD-10-CM

## 2023-01-10 DIAGNOSIS — T451X5A Adverse effect of antineoplastic and immunosuppressive drugs, initial encounter: Secondary | ICD-10-CM

## 2023-01-10 DIAGNOSIS — G720 Drug-induced myopathy: Secondary | ICD-10-CM

## 2023-01-10 DIAGNOSIS — N189 Chronic kidney disease, unspecified: Secondary | ICD-10-CM

## 2023-01-10 DIAGNOSIS — E785 Hyperlipidemia, unspecified: Secondary | ICD-10-CM | POA: Diagnosis not present

## 2023-01-10 DIAGNOSIS — M8580 Other specified disorders of bone density and structure, unspecified site: Secondary | ICD-10-CM

## 2023-01-10 DIAGNOSIS — I427 Cardiomyopathy due to drug and external agent: Secondary | ICD-10-CM

## 2023-01-10 MED ORDER — NITROGLYCERIN 0.4 MG SL SUBL: 0.4000 mg | SUBLINGUAL_TABLET | SUBLINGUAL | Status: AC | PRN

## 2023-01-10 NOTE — Patient Instructions (Addendum)
MIND diet  Coronary Artery Disease, Female Coronary artery disease (CAD) is a condition in which the arteries that lead to the heart (coronary arteries) become narrow or blocked. The narrowing or blockage can lead to decreased blood flow to the heart. Prolonged reduced blood flow can cause a heart attack (myocardial infarction, or MI). This condition may also be called coronary heart disease. CAD is the most common type of heart disease, and heart disease is the leading cause of death in women. It is important to understand what causes CAD and how it is treated. What are the causes? CAD is most often caused by atherosclerosis. This is the buildup of fat and cholesterol (plaque) on the inside of the arteries. Over time, the plaque may narrow or block the artery, reducing blood flow to the heart. Plaque can also become weak and break off within a coronary artery and cause a sudden blockage. Other less common causes of CAD include: A blood clot or a piece of another substance that blocks the flow of blood in a coronary artery (embolism). A tearing of the artery (spontaneous coronary artery dissection). An enlargement of an artery (aneurysm). Inflammation (vasculitis) in the artery wall. What increases the risk? The following factors may make you more likely to develop this condition: Age. Women older than 55 years are at a greater risk of CAD. Family history of CAD. High blood pressure (hypertension). Diabetes. High cholesterol levels. Obesity. Menopause. All postmenopausal women are at greater risk of CAD. Women who have experienced menopause between the ages of 45 and 63 (early menopause) are at a higher risk of CAD. Women who have experienced menopause before age 53 (premature menopause) are at a very high risk of CAD. Other risk factors include: Tobacco use. Excessive alcohol use. Lack of exercise. A diet high in saturated and trans fats, such as fried food and processed meat. What are  the signs or symptoms? Many people do not have any symptoms during the early stages of CAD. As the condition progresses, symptoms may include: Chest pain (angina). The pain can: Feel like crushing or squeezing, or like a tightness, pressure, fullness, or heaviness in the chest. Last more than a few minutes or can stop and recur. The pain tends to get worse with exercise or stress and to fade with rest. Pain in the arms, neck, jaw, ear, or back. Unexplained heartburn or indigestion. Shortness of breath. Nausea. Sudden light-headedness. Sudden cold sweats. Fluttering or fast heartbeat (palpitations). Many women have chest discomfort and the other symptoms. However, women often have unusual (atypical) symptoms, such as: Fatigue. Vomiting. Unexplained feelings of nervousness or anxiety. Unexplained weakness. Dizziness or fainting. How is this diagnosed? This condition is diagnosed based on: Your family and medical history. A physical exam. Tests. These may include: A test to check the electrical signals in your heart (electrocardiogram). Exercise stress test. This looks for signs of blockage when the heart is stressed with exercise, such as running on a treadmill. Pharmacologic stress test. This test looks for signs of blockage when the heart is being stressed with a medicine. Blood tests to check levels of cardiac enzymes such as troponin and creatine kinase. Coronary angiogram. This is a procedure to look at the coronary arteries to see if there is any blockage. During this test, a dye is injected into your arteries so they appear on an X-ray. Coronary artery CT scan. This scan helps detect calcium deposits in your coronary arteries. Calcium deposits are an indicator of CAD. A  test that uses sound waves to take a picture of your heart (echocardiogram). How is this treated? This condition may be treated by: Healthy lifestyle changes to reduce risk factors. Medicines such  as: Antiplatelet medicines such as clopidogrel or aspirin. These help to prevent blood clots. Nitroglycerin. Blood pressure medicines. Cholesterol-lowering medicine. Coronary angioplasty and stenting. During this procedure, a thin, flexible tube is inserted through a blood vessel and into a blocked artery. A balloon or similar device on the end of the tube is inflated to open up the artery. In some cases, a small, mesh tube (stent) is inserted into the artery to keep it open. Coronary artery bypass surgery. During this surgery, veins or arteries from other parts of the body are used to create a bypass around the blockage and allow blood to reach your heart. Follow these instructions at home: Medicines Take over-the-counter and prescription medicines only as told by your health care provider. Do not take the following medicines unless your health care provider approves: NSAIDs, such as ibuprofen, naproxen, or celecoxib. Vitamin supplements that contain vitamin A, vitamin E, or both. Hormone replacement therapy that contains estrogen with or without progestin. Lifestyle Follow an exercise program approved by your health care provider. Ask your health care provider if cardiac rehab is appropriate. Maintain a healthy weight or lose weight as approved by your health care provider. Learn to manage stress or try to limit your stress. Ask your health care provider for suggestions if you need help. Get screened for depression and seek treatment, if needed. Do not use any products that contain nicotine or tobacco. These products include cigarettes, chewing tobacco, and vaping devices, such as e-cigarettes. If you need help quitting, ask your health care provider. Eating and drinking  Follow a heart-healthy diet. A dietitian can help educate you about healthy food options and changes. In general, eat plenty of fruits and vegetables, lean meats, and whole grains. Avoid foods high in: Sugar. Salt  (sodium). Saturated fats, such as processed or fatty meat. Trans fats, such as fried food. Use healthy cooking methods such as roasting, grilling, broiling, baking, poaching, steaming, or stir-frying. Do not drink alcohol if: Your health care provider tells you not to drink. You are pregnant, may be pregnant, or are planning to become pregnant. If you drink alcohol: Limit how much you have to 0-1 drink a day. Know how much alcohol is in your drink. In the [place] one drink equals one 12 oz bottle of beer (355 mL), one 5 oz glass of wine (148 mL), or one 1 oz glass of hard liquor (44 mL). General instructions Manage any other health conditions, such as high cholesterol, hypertension, and diabetes. These conditions affect your heart. Your health care provider may ask you to monitor your blood pressure. Keep all follow-up visits. This is important. Get help right away if: You have pain in your chest, neck, ear, arm, jaw, stomach, or back that: Lasts more than a few minutes. Is recurring. Is not relieved by taking medicine under your tongue (sublingual nitroglycerin). You have profuse sweating without cause. You have unexplained: Heartburn or indigestion. Shortness of breath or difficulty breathing. Fluttering or fast heartbeat (palpitations). Fatigue or weakness. Nausea or vomiting. Feelings of nervousness or anxiety. You have sudden light-headedness or dizziness. You faint. These symptoms may be an emergency. Get help right away. Call 911. Do not wait to see if the symptoms will go away. Do not drive yourself to the hospital. Summary Coronary artery disease (CAD) is  a condition in which the arteries that lead to the heart (coronary arteries) become narrow or blocked. Prolonged reduced blood flow can cause a heart attack. Many women have chest discomfort and other common symptoms of CAD. However, women often have unusual (atypical) symptoms, such as fatigue, vomiting, weakness, or  dizziness. CAD can be treated with lifestyle changes, medicines, coronary angioplasty or stents, coronary artery bypass surgery, or a combination of these treatments. Keep all follow-up visits. This is important. This information is not intended to replace advice given to you by your health care provider. Make sure you discuss any questions you have with your health care provider. Document Revised: 05/13/2021 Document Reviewed: 05/13/2021 Elsevier Patient Education  2024 [employer].

## 2023-01-10 NOTE — Assessment & Plan Note (Signed)
She is following with cardiology and [facility]

## 2023-01-11 ENCOUNTER — Telehealth (HOSPITAL_COMMUNITY): Payer: Self-pay

## 2023-01-11 NOTE — Telephone Encounter (Signed)
Pt called wanting to schedule for cardiac rehab. I advised pt that her referral was closed for  no response. Pt stated that she would like to participate in cardiac rehab. I advised pt that she would have to get a referral sent over. Pt stated that she sees Dr. Antoine Poche for her cardiology needs now and I advised pt to get Dr. Antoine Poche to place a cardiac rehab referral and once we receive it we will give her a call for scheduling.

## 2023-01-20 DIAGNOSIS — Z853 Personal history of malignant neoplasm of breast: Secondary | ICD-10-CM | POA: Diagnosis not present

## 2023-01-20 DIAGNOSIS — Z9889 Other specified postprocedural states: Secondary | ICD-10-CM | POA: Diagnosis not present

## 2023-01-20 DIAGNOSIS — Z48812 Encounter for surgical aftercare following surgery on the circulatory system: Secondary | ICD-10-CM | POA: Diagnosis not present

## 2023-01-20 DIAGNOSIS — I428 Other cardiomyopathies: Secondary | ICD-10-CM | POA: Diagnosis not present

## 2023-01-20 DIAGNOSIS — I2510 Atherosclerotic heart disease of native coronary artery without angina pectoris: Secondary | ICD-10-CM | POA: Diagnosis not present

## 2023-01-20 DIAGNOSIS — Z789 Other specified health status: Secondary | ICD-10-CM | POA: Diagnosis not present

## 2023-01-24 DIAGNOSIS — R942 Abnormal results of pulmonary function studies: Secondary | ICD-10-CM | POA: Diagnosis not present

## 2023-01-24 DIAGNOSIS — R0609 Other forms of dyspnea: Secondary | ICD-10-CM | POA: Diagnosis not present

## 2023-01-24 DIAGNOSIS — R0602 Shortness of breath: Secondary | ICD-10-CM | POA: Diagnosis not present

## 2023-01-24 DIAGNOSIS — R29818 Other symptoms and signs involving the nervous system: Secondary | ICD-10-CM | POA: Diagnosis not present

## 2023-01-24 DIAGNOSIS — G4700 Insomnia, unspecified: Secondary | ICD-10-CM | POA: Diagnosis not present

## 2023-01-25 ENCOUNTER — Encounter (HOSPITAL_COMMUNITY): Payer: Self-pay

## 2023-01-31 ENCOUNTER — Ambulatory Visit: Payer: Medicare Other | Admitting: Family Medicine

## 2023-02-01 ENCOUNTER — Encounter (HOSPITAL_COMMUNITY)
Admission: RE | Admit: 2023-02-01 | Discharge: 2023-02-01 | Disposition: A | Discharge status: Home / Self Care | Payer: Medicare Other | Source: Ambulatory Visit | Attending: Cardiology | Admitting: Cardiology

## 2023-02-01 VITALS — BP 122/70 | HR 62 | Ht 65.0 in | Wt 170.0 lb

## 2023-02-01 DIAGNOSIS — Z48812 Encounter for surgical aftercare following surgery on the circulatory system: Secondary | ICD-10-CM | POA: Diagnosis not present

## 2023-02-01 DIAGNOSIS — Z955 Presence of coronary angioplasty implant and graft: Secondary | ICD-10-CM | POA: Insufficient documentation

## 2023-02-01 NOTE — Progress Notes (Signed)
Cardiac Rehab Medication Review   Does the patient  feel that his/her medications are working for him/her?  yes  Has the patient been experiencing any side effects to the medications prescribed?  no  Does the patient measure his/her own blood pressure or blood glucose at home?  Checks blood pressure when she feels she needs, has low BP.  Does the patient have any problems obtaining medications due to transportation or finances?   no  Understanding of regimen: excellent Understanding of indications: excellent Potential of compliance: excellent    Comments: Reviewed medication list with patient.    Cristy Hilts 02/01/2023 10:37 AM

## 2023-02-01 NOTE — Progress Notes (Signed)
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 more body fat. Cardiovascular exercise helps improve heart health, metabolism, hormonal balance, blood sugar control, and recovery from fatigue. Resistance training improves strength, endurance, balance, coordination, reaction time, metabolism, and muscle mass. Flexibility exercise improves circulation, posture, and balance. Seek guidance from your physician and exercise physiologist before implementing an exercise routine and learn your capabilities and proper form for all exercise.  Introduction to Yoga  Clinical staff conducted group or individual video education with verbal and written material and guidebook.  Patient learns about yoga, a discipline of the coming together of mind, breath, and body. The benefits of yoga include improved flexibility,  improved range of motion, better posture and core strength, increased lung function, weight loss, and positive self-image. Yoga's heart health benefits include lowered blood pressure, healthier heart rate, decreased cholesterol and triglyceride levels, improved immune function, and reduced stress. Seek guidance from your physician and exercise physiologist before implementing an exercise routine and learn your capabilities and proper form for all exercise.  Medical   Aging: Enhancing Your Quality of Life  Clinical staff conducted group or individual video education with verbal and written material and guidebook.  Patient learns key strategies and recommendations to stay in good physical health and enhance quality of life, such as prevention strategies, having an advocate, securing a Health Care Proxy and Power of Attorney, and keeping a list of medications and system for tracking them. It also discusses how to avoid risk for bone loss.  Biology of Weight Control  Clinical staff conducted group or individual video education with verbal and written material and guidebook.  Patient learns that weight gain occurs because we consume more calories than we burn (eating more, moving less). Even if your body weight is normal, you may have higher ratios of fat compared to muscle mass. Too much body fat puts you at increased risk for cardiovascular disease, heart attack, stroke, type 2 diabetes, and obesity-related cancers. In addition to exercise, following the Pritikin Eating Plan can help reduce your risk.  Decoding Lab Results  Clinical staff conducted group or individual video education with verbal and written material and guidebook.  Patient learns that lab test reflects one measurement whose values change over time and are influenced by many factors, including medication, stress, sleep, exercise, food, hydration, pre-existing medical conditions, and more. It is recommended to use the knowledge from this  video to become more involved with your lab results and evaluate your numbers to speak with your doctor.   Diseases  of Our Time - Overview  Clinical staff conducted group or individual video education with verbal and written material and guidebook.  Patient learns that according to the CDC, 50% to 70% of chronic diseases (such as obesity, type 2 diabetes, elevated lipids, hypertension, and heart disease) are avoidable through lifestyle improvements including healthier food choices, listening to satiety cues, and increased physical activity.  Sleep Disorders Clinical staff conducted group or individual video education with verbal and written material and guidebook.  Patient learns how good quality and duration of sleep are important to overall health and well-being. Patient also learns about sleep disorders and how they impact health along with recommendations to address them, including discussing with a physician.  Nutrition  Dining Out - Part 2 Clinical staff conducted group or individual video education with verbal and written material and guidebook.  Patient learns how to plan ahead and communicate in order to maximize their dining experience in a healthy and nutritious manner. Included are recommended food choices based on the type of restaurant the patient is visiting.   Fueling a Banker conducted group or individual video education with verbal and written material and guidebook.  There is a strong connection between our food choices and our health. Diseases like obesity and type 2 diabetes are very prevalent and are in large-part due to lifestyle choices. The Pritikin Eating Plan provides plenty of food and hunger-curbing satisfaction. It is easy to follow, affordable, and helps reduce health risks.  Menu Workshop  Clinical staff conducted group or individual video education with verbal and written material and guidebook.  Patient learns that restaurant meals can  sabotage health goals because they are often packed with calories, fat, sodium, and sugar. Recommendations include strategies to plan ahead and to communicate with the manager, chef, or server to help order a healthier meal.  Planning Your Eating Strategy  Clinical staff conducted group or individual video education with verbal and written material and guidebook.  Patient learns about the Pritikin Eating Plan and its benefit of reducing the risk of disease. The Pritikin Eating Plan does not focus on calories. Instead, it emphasizes high-quality, nutrient-rich foods. By knowing the characteristics of the foods, we choose, we can determine their calorie density and make informed decisions.  Targeting Your Nutrition Priorities  Clinical staff conducted group or individual video education with verbal and written material and guidebook.  Patient learns that lifestyle habits have a tremendous impact on disease risk and progression. This video provides eating and physical activity recommendations based on your personal health goals, such as reducing LDL cholesterol, losing weight, preventing or controlling type 2 diabetes, and reducing high blood pressure.  Vitamins and Minerals  Clinical staff conducted group or individual video education with verbal and written material and guidebook.  Patient learns different ways to obtain key vitamins and minerals, including through a recommended healthy diet. It is important to discuss all supplements you take with your doctor.   Healthy Mind-Set    Smoking Cessation  Clinical staff conducted group or individual video education with verbal and written material and guidebook.  Patient learns that cigarette smoking and tobacco addiction pose a serious health risk which affects millions of people. Stopping smoking will significantly reduce the risk of heart disease, lung disease, and many forms of cancer. Recommended strategies for quitting are covered, including  working with your doctor to develop a successful plan.  Culinary   Becoming a Set designer conducted group or individual  video education with verbal and written material and guidebook.  Patient learns that cooking at home can be healthy, cost-effective, quick, and puts them in control. Keys to cooking healthy recipes will include looking at your recipe, assessing your equipment needs, planning ahead, making it simple, choosing cost-effective seasonal ingredients, and limiting the use of added fats, salts, and sugars.  Cooking - Breakfast and Snacks  Clinical staff conducted group or individual video education with verbal and written material and guidebook.  Patient learns how important breakfast is to satiety and nutrition through the entire day. Recommendations include key foods to eat during breakfast to help stabilize blood sugar levels and to prevent overeating at meals later in the day. Planning ahead is also a key component.  Cooking - Educational psychologist conducted group or individual video education with verbal and written material and guidebook.  Patient learns eating strategies to improve overall health, including an approach to cook more at home. Recommendations include thinking of animal protein as a side on your plate rather than center stage and focusing instead on lower calorie dense options like vegetables, fruits, whole grains, and plant-based proteins, such as beans. Making sauces in large quantities to freeze for later and leaving the skin on your vegetables are also recommended to maximize your experience.  Cooking - Healthy Salads and Dressing Clinical staff conducted group or individual video education with verbal and written material and guidebook.  Patient learns that vegetables, fruits, whole grains, and legumes are the foundations of the Pritikin Eating Plan. Recommendations include how to incorporate each of these in flavorful and healthy  salads, and how to create homemade salad dressings. Proper handling of ingredients is also covered. Cooking - Soups and [employer] - Soups and Desserts Clinical staff conducted group or individual video education with verbal and written material and guidebook.  Patient learns that Pritikin soups and desserts make for easy, nutritious, and delicious snacks and meal components that are low in sodium, fat, sugar, and calorie density, while high in vitamins, minerals, and filling fiber. Recommendations include simple and healthy ideas for soups and desserts.   Overview     The Pritikin Solution Program Overview Clinical staff conducted group or individual video education with verbal and written material and guidebook.  Patient learns that the results of the Pritikin Program have been documented in more than 100 articles published in peer-reviewed journals, and the benefits include reducing risk factors for (and, in some cases, even reversing) high cholesterol, high blood pressure, type 2 diabetes, obesity, and more! An overview of the three key pillars of the Pritikin Program will be covered: eating well, doing regular exercise, and having a healthy mind-set.  WORKSHOPS  Exercise: Exercise Basics: Building Your Action Plan Clinical staff led group instruction and group discussion with PowerPoint presentation and patient guidebook. To enhance the learning environment the use of posters, models and videos may be added. At the conclusion of this workshop, patients will comprehend the difference between physical activity and exercise, as well as the benefits of incorporating both, into their routine. Patients will understand the FITT (Frequency, Intensity, Time, and Type) principle and how to use it to build an exercise action plan. In addition, safety concerns and other considerations for exercise and cardiac rehab will be addressed by the presenter. The purpose of this lesson is to promote a  comprehensive and effective weekly exercise routine in order to improve patients' overall level of fitness.   Managing Heart Disease: Your  Path to a Healthier Heart Clinical staff led group instruction and group discussion with PowerPoint presentation and patient guidebook. To enhance the learning environment the use of posters, models and videos may be added.At the conclusion of this workshop, patients will understand the anatomy and physiology of the heart. Additionally, they will understand how Pritikin's three pillars impact the risk factors, the progression, and the management of heart disease.  The purpose of this lesson is to provide a high-level overview of the heart, heart disease, and how the Pritikin lifestyle positively impacts risk factors.  Exercise Biomechanics Clinical staff led group instruction and group discussion with PowerPoint presentation and patient guidebook. To enhance the learning environment the use of posters, models and videos may be added. Patients will learn how the structural parts of their bodies function and how these functions impact their daily activities, movement, and exercise. Patients will learn how to promote a neutral spine, learn how to manage pain, and identify ways to improve their physical movement in order to promote healthy living. The purpose of this lesson is to expose patients to common physical limitations that impact physical activity. Participants will learn practical ways to adapt and manage aches and pains, and to minimize their effect on regular exercise. Patients will learn how to maintain good posture while sitting, walking, and lifting.  Balance Training and Fall Prevention  Clinical staff led group instruction and group discussion with PowerPoint presentation and patient guidebook. To enhance the learning environment the use of posters, models and videos may be added. At the conclusion of this workshop, patients will understand the  importance of their sensorimotor skills (vision, proprioception, and the vestibular system) in maintaining their ability to balance as they age. Patients will apply a variety of balancing exercises that are appropriate for their current level of function. Patients will understand the common causes for poor balance, possible solutions to these problems, and ways to modify their physical environment in order to minimize their fall risk. The purpose of this lesson is to teach patients about the importance of maintaining balance as they age and ways to minimize their risk of falling.  WORKSHOPS   Nutrition:  Fueling a Ship broker led group instruction and group discussion with PowerPoint presentation and patient guidebook. To enhance the learning environment the use of posters, models and videos may be added. Patients will review the foundational principles of the Pritikin Eating Plan and understand what constitutes a serving size in each of the food groups. Patients will also learn Pritikin-friendly foods that are better choices when away from home and review make-ahead meal and snack options. Calorie density will be reviewed and applied to three nutrition priorities: weight maintenance, weight loss, and weight gain. The purpose of this lesson is to reinforce (in a group setting) the key concepts around what patients are recommended to eat and how to apply these guidelines when away from home by planning and selecting Pritikin-friendly options. Patients will understand how calorie density may be adjusted for different weight management goals.  Mindful Eating  Clinical staff led group instruction and group discussion with PowerPoint presentation and patient guidebook. To enhance the learning environment the use of posters, models and videos may be added. Patients will briefly review the concepts of the Pritikin Eating Plan and the importance of low-calorie dense foods. The concept of mindful  eating will be introduced as well as the importance of paying attention to internal hunger signals. Triggers for non-hunger eating and techniques for dealing with  triggers will be explored. The purpose of this lesson is to provide patients with the opportunity to review the basic principles of the Pritikin Eating Plan, discuss the value of eating mindfully and how to measure internal cues of hunger and fullness using the Hunger Scale. Patients will also discuss reasons for non-hunger eating and learn strategies to use for controlling emotional eating.  Targeting Your Nutrition Priorities Clinical staff led group instruction and group discussion with PowerPoint presentation and patient guidebook. To enhance the learning environment the use of posters, models and videos may be added. Patients will learn how to determine their genetic susceptibility to disease by reviewing their family history. Patients will gain insight into the importance of diet as part of an overall healthy lifestyle in mitigating the impact of genetics and other environmental insults. The purpose of this lesson is to provide patients with the opportunity to assess their personal nutrition priorities by looking at their family history, their own health history and current risk factors. Patients will also be able to discuss ways of prioritizing and modifying the Pritikin Eating Plan for their highest risk areas  Menu  Clinical staff led group instruction and group discussion with PowerPoint presentation and patient guidebook. To enhance the learning environment the use of posters, models and videos may be added. Using menus brought in from [employer], or printed from [employer], patients will apply the Pritikin dining out guidelines that were presented in the [employer] video. Patients will also be able to practice these guidelines in a variety of provided scenarios. The purpose of this lesson is to provide  patients with the opportunity to practice hands-on learning of the Pritikin Dining Out guidelines with actual menus and practice scenarios.  Label Reading Clinical staff led group instruction and group discussion with PowerPoint presentation and patient guidebook. To enhance the learning environment the use of posters, models and videos may be added. Patients will review and discuss the Pritikin label reading guidelines presented in Pritikin's Label Reading Educational series video. Using fool labels brought in from local grocery stores and markets, patients will apply the label reading guidelines and determine if the packaged food meet the Pritikin guidelines. The purpose of this lesson is to provide patients with the opportunity to review, discuss, and practice hands-on learning of the Pritikin Label Reading guidelines with actual packaged food labels. Cooking School  Pritikin's [employer] are designed to teach patients ways to prepare quick, simple, and affordable recipes at home. The importance of nutrition's role in chronic disease risk reduction is reflected in its emphasis in the overall Pritikin program. By learning how to prepare essential core Pritikin Eating Plan recipes, patients will increase control over what they eat; be able to customize the flavor of foods without the use of added salt, sugar, or fat; and improve the quality of the food they consume. By learning a set of core recipes which are easily assembled, quickly prepared, and affordable, patients are more likely to prepare more healthy foods at home. These workshops focus on convenient breakfasts, simple entres, side dishes, and desserts which can be prepared with minimal effort and are consistent with nutrition recommendations for cardiovascular risk reduction. Cooking [employer] are taught by a Armed forces logistics/support/administrative officer (RD) who has been trained by the [employer]. The chef or RD has a clear  understanding of the importance of minimizing - if not completely eliminating - added fat, sugar, and sodium in recipes. Throughout the series  of [employer], patients will learn about healthy ingredients and efficient methods of cooking to build confidence in their capability to prepare    Cooking School weekly topics:  Adding Flavor- Sodium-Free  Fast and Healthy Breakfasts  Powerhouse Plant-Based Proteins  Satisfying Salads and Dressings  Simple Sides and Sauces  International Cuisine-Spotlight on the [employer] Zones  Delicious Desserts  Savory Soups  [employer] - Meals in a Astronomer Appetizers and Snacks  Comforting Weekend Breakfasts  One-Pot Wonders   Fast Evening Meals  Landscape architect Your Pritikin Plate  WORKSHOPS   Healthy Mindset (Psychosocial):  Focused Goals, Sustainable Changes Clinical staff led group instruction and group discussion with PowerPoint presentation and patient guidebook. To enhance the learning environment the use of posters, models and videos may be added. Patients will be able to apply effective goal setting strategies to establish at least one personal goal, and then take consistent, meaningful action toward that goal. They will learn to identify common barriers to achieving personal goals and develop strategies to overcome them. Patients will also gain an understanding of how our mind-set can impact our ability to achieve goals and the importance of cultivating a positive and growth-oriented mind-set. The purpose of this lesson is to provide patients with a deeper understanding of how to set and achieve personal goals, as well as the tools and strategies needed to overcome common obstacles which may arise along the way.  From Head to Heart: The Power of a Healthy Outlook  Clinical staff led group instruction and group discussion with PowerPoint presentation and patient guidebook. To enhance the learning  environment the use of posters, models and videos may be added. Patients will be able to recognize and describe the impact of emotions and mood on physical health. They will discover the importance of self-care and explore self-care practices which may work for them. Patients will also learn how to utilize the 4 C's to cultivate a healthier outlook and better manage stress and challenges. The purpose of this lesson is to demonstrate to patients how a healthy outlook is an essential part of maintaining good health, especially as they continue their cardiac rehab journey.  Healthy Sleep for a Healthy Heart Clinical staff led group instruction and group discussion with PowerPoint presentation and patient guidebook. To enhance the learning environment the use of posters, models and videos may be added. At the conclusion of this workshop, patients will be able to demonstrate knowledge of the importance of sleep to overall health, well-being, and quality of life. They will understand the symptoms of, and treatments for, common sleep disorders. Patients will also be able to identify daytime and nighttime behaviors which impact sleep, and they will be able to apply these tools to help manage sleep-related challenges. The purpose of this lesson is to provide patients with a general overview of sleep and outline the importance of quality sleep. Patients will learn about a few of the most common sleep disorders. Patients will also be introduced to the concept of "sleep hygiene," and discover ways to self-manage certain sleeping problems through simple daily behavior changes. Finally, the workshop will motivate patients by clarifying the links between quality sleep and their goals of heart-healthy living.   Recognizing and Reducing Stress Clinical staff led group instruction and group discussion with PowerPoint presentation and patient guidebook. To enhance the learning environment the use of posters, models and videos  may be added. At the conclusion of this workshop, patients will be  able to understand the types of stress reactions, differentiate between acute and chronic stress, and recognize the impact that chronic stress has on their health. They will also be able to apply different coping mechanisms, such as reframing negative self-talk. Patients will have the opportunity to practice a variety of stress management techniques, such as deep abdominal breathing, progressive muscle relaxation, and/or guided imagery.  The purpose of this lesson is to educate patients on the role of stress in their lives and to provide healthy techniques for coping with it.  Learning Barriers/Preferences:  Learning Barriers/Preferences - 02/01/23 1059       Learning Barriers/Preferences   Learning Barriers None    Learning Preferences Skilled Demonstration             Education Topics:  Knowledge Questionnaire Score:  Knowledge Questionnaire Score - 02/01/23 1101       Knowledge Questionnaire Score   Pre Score 19/24             Core Components/Risk Factors/Patient Goals at Admission:  Personal Goals and Risk Factors at Admission - 02/01/23 1058       Core Components/Risk Factors/Patient Goals on Admission   Lipids Yes    Intervention Provide education and support for participant on nutrition & aerobic/resistive exercise along with prescribed medications to achieve LDL 70mg , HDL >40mg .    Expected Outcomes Short Term: Participant states understanding of desired cholesterol values and is compliant with medications prescribed. Participant is following exercise prescription and nutrition guidelines.;Long Term: Cholesterol controlled with medications as prescribed, with individualized exercise RX and with personalized nutrition plan. Value goals: LDL < 70mg , HDL > 40 mg.    Stress Yes    Intervention Offer individual and/or small group education and counseling on adjustment to heart disease, stress management and  health-related lifestyle change. Teach and support self-help strategies.;Refer participants experiencing significant psychosocial distress to appropriate mental health specialists for further evaluation and treatment. When possible, include family members and significant others in education/counseling sessions.    Expected Outcomes Short Term: Participant demonstrates changes in health-related behavior, relaxation and other stress management skills, ability to obtain effective social support, and compliance with psychotropic medications if prescribed.;Long Term: Emotional wellbeing is indicated by absence of clinically significant psychosocial distress or social isolation.             Core Components/Risk Factors/Patient Goals Review:    Core Components/Risk Factors/Patient Goals at Discharge (Final Review):    ITP Comments:  ITP Comments     Row Name 02/01/23 1024           ITP Comments Medical Director- Dr. Armanda Magic, MD. Introduction to the [facility] / Intensive Cardiac Rehab. Initial orientation packet reviewed with the patient today.                Comments: Participant attended orientation for the cardiac rehabilitation program on  02/01/2023  to perform initial intake and exercise walk test. Patient introduced to the [facility] education and orientation packet was reviewed. Completed 6-minute walk test, measurements, initial ITP, and exercise prescription. Vital signs stable. Telemetry-normal sinus rhythm, rare PVC/PAC, asymptomatic.   Service time was from 1024 to 1203. Artist Pais, MS, ACSM CEP 02/01/23 1540

## 2023-02-07 ENCOUNTER — Encounter (HOSPITAL_COMMUNITY)
Admission: RE | Admit: 2023-02-07 | Discharge: 2023-02-07 | Disposition: A | Discharge status: Home / Self Care | Payer: Medicare Other | Source: Ambulatory Visit | Attending: Cardiology | Admitting: Cardiology

## 2023-02-07 DIAGNOSIS — Z955 Presence of coronary angioplasty implant and graft: Secondary | ICD-10-CM | POA: Diagnosis not present

## 2023-02-07 DIAGNOSIS — Z48812 Encounter for surgical aftercare following surgery on the circulatory system: Secondary | ICD-10-CM | POA: Diagnosis not present

## 2023-02-07 NOTE — Progress Notes (Signed)
Daily Session Note  Patient Details  Name: [name] MRN: [id number] Date of Birth: Jul 20, 1955 Referring Provider:   Flowsheet Row INTENSIVE CARDIAC REHAB ORIENT from 02/01/2023 in [facility]  Referring Provider Arrie Senate, MD Kateri Mc)  Quintella Reichert, MD (coverage)]       Encounter Date: 02/07/2023  Check In:  Session Check In - 02/07/23 0905       Check-In   Supervising physician immediately available to respond to emergencies [facility] MD immediately available    Physician(s) Jari Favre, PA-C    Location [facility]    Staff Present Cristy Hilts, MS, ACSM-CEP, Exercise Physiologist; Gerre Scull, RN, Marton Redwood, MS, ACSM-CEP, CCRP, Exercise Physiologist;Carlette Les Pou, RN, BSN;Johnny Porter, MS, Exercise Physiologist    Virtual Visit No    Medication changes reported     No    Fall or balance concerns reported    No    Tobacco Cessation No Change    Warm-up and Cool-down Performed as group-led instruction    Resistance Training Performed Yes    VAD Patient? No    PAD/SET Patient? No      Pain Assessment   Currently in Pain? No/denies    Pain Score 0-No pain    Pain Onset More than a month ago    Multiple Pain Sites No             Capillary Blood Glucose: No results found. However, due to the size of the patient record, not all encounters were searched. Please check Results Review for a complete set of results.    Social History   Tobacco Use  Smoking Status Never  Smokeless Tobacco Never    Goals Met:  Exercise tolerated well No report of concerns or symptoms today Strength training completed today  Goals Unmet:  Not Applicable  Comments: Original: Pt started cardiac rehab today. Pt tolerated light exercise without difficulty. VSS, telemetry-Sinus Rhythm. Medication list reconciled. Pt denies barriers to medication compliance. PSYCHOSOCIAL ASSESSMENT:   PHQ-2/PHQ-9 score 0/2. Pt exhibits positive coping skills, hopeful outlook with supportive family. Pt states she is concerned about her husband who has osteomyelitis and wound vac with a non-healing wound. No psychosocial needs identified at this time, no psychosocial interventions necessary. Pt gardening, reading, cooking and exercising. [name]'s goals for cardiac rehab are to increase her strength and stamina and get back to her exercise routine. Pt oriented to exercise equipment and routine. Pt verbalized understanding.    Dr. Armanda Magic is Medical Director for Cardiac Rehab at [facility].

## 2023-02-09 ENCOUNTER — Encounter (HOSPITAL_COMMUNITY)
Admission: RE | Admit: 2023-02-09 | Discharge: 2023-02-09 | Disposition: A | Discharge status: Home / Self Care | Payer: Medicare Other | Source: Ambulatory Visit | Attending: Cardiology | Admitting: Cardiology

## 2023-02-09 DIAGNOSIS — Z955 Presence of coronary angioplasty implant and graft: Secondary | ICD-10-CM | POA: Diagnosis not present

## 2023-02-09 DIAGNOSIS — Z48812 Encounter for surgical aftercare following surgery on the circulatory system: Secondary | ICD-10-CM | POA: Diagnosis not present

## 2023-02-09 NOTE — Progress Notes (Deleted)
Cardiology Office Note:  .   Date:  02/09/2023  ID:  [name], DOB [birth year], MRN [id number] PCP: Bradd Canary, MD  [facility] Providers Cardiologist:  Rollene Rotunda, MD   History of Present Illness: .   [name] is a 67 y.o. female with a past medical history of anomalous SVC, aortic calcification, cardiomyopathy secondary to chemotherapy, nonobstructive CAD. Patient is followed by Dr. Antoine Poche and presents today for a 6 month follow up appointment  Patient previously had a cardiac catheterization in 2012 that revealed 50% LAD stenosis, EF 40-45% with some cavity dilation, abnormal connection of superior vena cava to her coronary sinus. This was also confirmed with echocardiogram and there was no evidence of ASD. Her reduced EF/nonischemic cardiomyopathy was felt to be related to history of chemotherapy for breast cancer. Later, patient had a right heart catheterization in 2016 that showed normal right heart pressures. Echocardiogram in 2018 showed EF  55-60%, no regional wall motion abnormalities, grade I DD. Carotid ultasounds in 01/2017 showed mild stenosis in bilateral internal carotid arteries (unchanged on repeat studies in 2020)   Echocardiogram in 04/2022 showed EF 50-55%, no regional wall motion abnormalities, normal RV function. Patient underwent ETT in 07/2022 that was a low risk study without electrocardiographic evidence of ischemia. Patient was last seen by Dr. Antoine Poche on 08/23/22. At that appointment, it was noted that patient had been seeing pulmonary for evaluation of dyspnea. She had PFTs completed and the only abnormality was [facility]. Pulmonary told patient that it may be heart failure, but her recent echo in 04/2022 had been stable. She did not have edema, PND, orthopnea, fever, or chills. She was given a brief course of lasix to see if there was improvement.   Nonischemic Cardiomyopathy  - EF was previously 40-45% in 2012. However, had been stable  at 55-60% since at least 2018  - Most recent echocardiogram in 04/2022 showed EF 50-55%, no wall motion abnormalities, normal RV function  - Patient Euvolemic on exam *** - On lasix ?***   Anomalous SVC  - Noted on cardiac catheterization in 2012- patient's abnormal connection of SVC to coronary sinus  - RHC in 2016 demonstrated no evidence of shunting. Per Dr. Jenene Slicker notes, no further testing suggested   Nonobstructive CAD  - Catheterization in 2012 showed 50% LAD stenosis - ETT in 07/2022 was a low risk study without electrocardiographic evidence of ischemia  - ASA? Cholesterol?   Aortic Calcification   HLD  - Lipid panel from 10/2022 showed LDL 144  - Patient has been intolerant of statins in the past  - Referral to lipid clinic?     ROS: ***  Studies Reviewed: .        *** Risk Assessment/Calculations:   {Does this patient have ATRIAL FIBRILLATION?[phone]} No BP recorded.  {Refresh Note OR Click here to enter BP  :1}***       Physical Exam:   VS:  There were no vitals taken for this visit.   Wt Readings from Last 3 Encounters:  02/01/23 169 lb 15.6 oz (77.1 kg)  01/10/23 165 lb 9.6 oz (75.1 kg)  10/28/22 162 lb 6.4 oz (73.7 kg)    GEN: Well nourished, well developed in no acute distress NECK: No JVD; No carotid bruits CARDIAC: ***RRR, no murmurs, rubs, gallops RESPIRATORY:  Clear to auscultation without rales, wheezing or rhonchi  ABDOMEN: Soft, non-tender, non-distended EXTREMITIES:  No edema; No deformity   ASSESSMENT AND PLAN: .   ***    {  Are you ordering a CV Procedure (e.g. stress test, cath, DCCV, TEE, etc)?   Press F2        :[id number]}  Dispo: ***  Signed, Jonita Albee, PA-C

## 2023-02-11 ENCOUNTER — Encounter (HOSPITAL_COMMUNITY): Admission: RE | Admit: 2023-02-11 | Payer: Medicare Other | Source: Ambulatory Visit

## 2023-02-11 DIAGNOSIS — Z955 Presence of coronary angioplasty implant and graft: Secondary | ICD-10-CM

## 2023-02-11 DIAGNOSIS — Z48812 Encounter for surgical aftercare following surgery on the circulatory system: Secondary | ICD-10-CM | POA: Diagnosis not present

## 2023-02-14 ENCOUNTER — Encounter (HOSPITAL_COMMUNITY)
Admission: RE | Admit: 2023-02-14 | Discharge: 2023-02-14 | Disposition: A | Discharge status: Home / Self Care | Payer: Medicare Other | Source: Ambulatory Visit | Attending: Cardiology | Admitting: Cardiology

## 2023-02-14 DIAGNOSIS — Z955 Presence of coronary angioplasty implant and graft: Secondary | ICD-10-CM

## 2023-02-14 DIAGNOSIS — Z48812 Encounter for surgical aftercare following surgery on the circulatory system: Secondary | ICD-10-CM | POA: Diagnosis not present

## 2023-02-16 ENCOUNTER — Encounter (HOSPITAL_COMMUNITY)
Admission: RE | Admit: 2023-02-16 | Discharge: 2023-02-16 | Disposition: A | Discharge status: Home / Self Care | Payer: Medicare Other | Source: Ambulatory Visit | Attending: Cardiology | Admitting: Cardiology

## 2023-02-16 DIAGNOSIS — Z48812 Encounter for surgical aftercare following surgery on the circulatory system: Secondary | ICD-10-CM | POA: Diagnosis not present

## 2023-02-16 DIAGNOSIS — Z955 Presence of coronary angioplasty implant and graft: Secondary | ICD-10-CM

## 2023-02-18 ENCOUNTER — Other Ambulatory Visit: Payer: Self-pay | Admitting: Family Medicine

## 2023-02-18 ENCOUNTER — Encounter (HOSPITAL_COMMUNITY): Payer: Medicare Other

## 2023-02-18 ENCOUNTER — Other Ambulatory Visit (HOSPITAL_COMMUNITY): Payer: Self-pay | Admitting: Obstetrics & Gynecology

## 2023-02-18 DIAGNOSIS — Z853 Personal history of malignant neoplasm of breast: Secondary | ICD-10-CM

## 2023-02-21 ENCOUNTER — Ambulatory Visit (INDEPENDENT_AMBULATORY_CARE_PROVIDER_SITE_OTHER): Payer: Medicare Other

## 2023-02-21 ENCOUNTER — Encounter (HOSPITAL_COMMUNITY): Payer: Medicare Other

## 2023-02-21 ENCOUNTER — Ambulatory Visit: Payer: Medicare Other | Admitting: Cardiology

## 2023-02-21 ENCOUNTER — Ambulatory Visit: Payer: Medicare Other | Admitting: Internal Medicine

## 2023-02-21 ENCOUNTER — Encounter: Payer: Self-pay | Admitting: Internal Medicine

## 2023-02-21 VITALS — BP 122/72 | HR 82 | Ht 65.5 in | Wt 170.0 lb

## 2023-02-21 DIAGNOSIS — J22 Unspecified acute lower respiratory infection: Secondary | ICD-10-CM | POA: Diagnosis not present

## 2023-02-21 DIAGNOSIS — R053 Chronic cough: Secondary | ICD-10-CM

## 2023-02-21 DIAGNOSIS — R059 Cough, unspecified: Secondary | ICD-10-CM | POA: Diagnosis not present

## 2023-02-21 MED ORDER — DOXYCYCLINE HYCLATE 100 MG PO TABS: 100.0000 mg | ORAL_TABLET | Freq: Two times a day (BID) | ORAL | 0 refills | Status: DC

## 2023-02-21 NOTE — Patient Instructions (Addendum)
I recommend nasal saline lavage for your upper respiratory symptoms and nasal congestion. You can get nasal saline over the counter or use a neti-pot, just make sure using boiled/distilled water.   For chest pain you can try salon-pas patches over the counter, warm compresses, and continue tylenol as you are doing.   I am prescribing an antibiotic to your pharmacy. Take this for 5 days with food.   Follow up with [place] in 2 weeks if you're not feeling better.
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
## 2023-02-22 ENCOUNTER — Telehealth (HOSPITAL_COMMUNITY): Payer: Self-pay | Admitting: *Deleted

## 2023-02-22 NOTE — Telephone Encounter (Signed)
Left message to call cardiac rehab.Maria Walden Whitaker RN BSN  

## 2023-02-22 NOTE — Progress Notes (Signed)
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 Time 0815   Class Stop Time 0904   Class Time Calculation (min) 49 min    Row Name 02/16/23 1000     Education   Cardiac Education Topics Pritikin   Secondary school teacher School   Educator Dietitian   Weekly Topic Adding Flavor - Sodium-Free   Instruction Review Code 1- Verbalizes Understanding   Class Start Time [phone]   Class Stop Time 0845   Class Time Calculation (min) 31 min            Core Videos: Exercise    Move It!  Clinical staff conducted  group or individual video education with verbal and written material and guidebook.  Patient learns the recommended Pritikin exercise program. Exercise with the goal of living a long, healthy life. Some of the health benefits of exercise include controlled diabetes, healthier blood pressure levels, improved cholesterol levels, improved heart and lung capacity, improved sleep, and better body composition. Everyone should speak with their doctor before starting or changing an exercise routine.  Biomechanical Limitations Clinical staff conducted group or individual video education with verbal and written material and guidebook.  Patient learns how biomechanical limitations can impact exercise and how we can mitigate and possibly overcome limitations to have an impactful and balanced exercise routine.  Body Composition Clinical staff conducted group or individual video education with verbal and written material and guidebook.  Patient learns that body composition (ratio of muscle mass to fat mass) is a key component to assessing overall fitness, rather than body weight alone. Increased fat mass, especially visceral belly fat, can put [place] at increased risk for metabolic syndrome, type 2 diabetes, heart disease, and even death. It is recommended to combine diet and exercise (cardiovascular and resistance training) to improve your body composition. Seek guidance from your physician and exercise physiologist before implementing an exercise routine.  Exercise Action Plan Clinical staff conducted group or individual video education with verbal and written material and guidebook.  Patient learns the recommended strategies to achieve and enjoy long-term exercise adherence, including variety, self-motivation, self-efficacy, and positive decision making. Benefits of exercise include fitness, good health, weight management, more energy, better sleep, less stress, and overall well-being.  Medical   Heart Disease Risk  Reduction Clinical staff conducted group or individual video education with verbal and written material and guidebook.  Patient learns our heart is our most vital organ as it circulates oxygen, nutrients, white blood cells, and hormones throughout the entire body, and carries waste away. Data supports a plant-based eating plan like the Pritikin Program for its effectiveness in slowing progression of and reversing heart disease. The video provides a number of recommendations to address heart disease.   Metabolic Syndrome and Belly Fat  Clinical staff conducted group or individual video education with verbal and written material and guidebook.  Patient learns what metabolic syndrome is, how it leads to heart disease, and how one can reverse it and  keep it from coming back. You have metabolic syndrome if you have 3 of the following 5 criteria: abdominal obesity, high blood pressure, high triglycerides, low HDL cholesterol, and high blood sugar.  Hypertension and Heart Disease Clinical staff conducted group or individual video education with verbal and written material and guidebook.  Patient learns that high blood pressure, or hypertension, is very common in the [place]. Hypertension is largely due to excessive salt intake, but other important risk factors include being overweight, physical inactivity, drinking too much alcohol, smoking, and not eating enough potassium from fruits and vegetables. High blood pressure is a leading risk factor for heart attack, stroke, congestive heart failure, dementia, kidney failure, and premature death. Long-term effects of excessive salt intake include stiffening of the arteries and thickening of heart muscle and organ damage. Recommendations include ways to reduce hypertension and the risk of heart disease.  Diseases of Our Time - Focusing on Diabetes Clinical staff conducted group or individual video education with verbal and written material and guidebook.   Patient learns why the best way to stop diseases of our time is prevention, through food and other lifestyle changes. Medicine (such as prescription pills and surgeries) is often only a Band-Aid on the problem, not a long-term solution. Most common diseases of our time include obesity, type 2 diabetes, hypertension, heart disease, and cancer. The Pritikin Program is recommended and has been proven to help reduce, reverse, and/or prevent the damaging effects of metabolic syndrome.  Nutrition   Overview of the Pritikin Eating Plan  Clinical staff conducted group or individual video education with verbal and written material and guidebook.  Patient learns about the Pritikin Eating Plan for disease risk reduction. The Pritikin Eating Plan emphasizes a wide variety of unrefined, minimally-processed carbohydrates, like fruits, vegetables, whole grains, and legumes. Go, Caution, and Stop food choices are explained. Plant-based and lean animal proteins are emphasized. Rationale provided for low sodium intake for blood pressure control, low added sugars for blood sugar stabilization, and low added fats and oils for coronary artery disease risk reduction and weight management.  Calorie Density  Clinical staff conducted group or individual video education with verbal and written material and guidebook.  Patient learns about calorie density and how it impacts the Pritikin Eating Plan. Knowing the characteristics of the food you choose will help you decide whether those foods will lead to weight gain or weight loss, and whether you want to consume more or less of them. Weight loss is usually a side effect of the Pritikin Eating Plan because of its focus on low calorie-dense foods.  Label Reading  Clinical staff conducted group or individual video education with verbal and written material and guidebook.  Patient learns about the Pritikin recommended label reading guidelines and corresponding recommendations  regarding calorie density, added sugars, sodium content, and whole grains.  Dining Out - Part 1  Clinical staff conducted group or individual video education with verbal and written material and guidebook.  Patient learns that restaurant meals can be sabotaging because they can be so high in calories, fat, sodium, and/or sugar. Patient learns recommended strategies on how to positively address this and avoid unhealthy pitfalls.  Facts on Fats  Clinical staff conducted group or individual video education with verbal and written material and guidebook.  Patient learns that lifestyle modifications can be just as effective, if not more so, as many medications for lowering your risk of heart disease. A Pritikin lifestyle can help to reduce your risk of  inflammation and atherosclerosis (cholesterol build-up, or plaque, in the artery walls). Lifestyle interventions such as dietary choices and physical activity address the cause of atherosclerosis. A review of the types of fats and their impact on blood cholesterol levels, along with dietary recommendations to reduce fat intake is also included.  Nutrition Action Plan  Clinical staff conducted group or individual video education with verbal and written material and guidebook.  Patient learns how to incorporate Pritikin recommendations into their lifestyle. Recommendations include planning and keeping personal health goals in mind as an important part of their success.  Healthy Mind-Set    Healthy Minds, Bodies, Hearts  Clinical staff conducted group or individual video education with verbal and written material and guidebook.  Patient learns how to identify when they are stressed. Video will discuss the impact of that stress, as well as the many benefits of stress management. Patient will also be introduced to stress management techniques. The way we think, act, and feel has an impact on our hearts.  How Our Thoughts Can Heal Our Hearts  Clinical staff  conducted group or individual video education with verbal and written material and guidebook.  Patient learns that negative thoughts can cause depression and anxiety. This can result in negative lifestyle behavior and serious health problems. Cognitive behavioral therapy is an effective method to help control our thoughts in order to change and improve our emotional outlook.  Additional Videos:  Exercise    Improving Performance  Clinical staff conducted group or individual video education with verbal and written material and guidebook.  Patient learns to use a non-linear approach by alternating intensity levels and lengths of time spent exercising to help burn more calories and lose more body fat. Cardiovascular exercise helps improve heart health, metabolism, hormonal balance, blood sugar control, and recovery from fatigue. Resistance training improves strength, endurance, balance, coordination, reaction time, metabolism, and muscle mass. Flexibility exercise improves circulation, posture, and balance. Seek guidance from your physician and exercise physiologist before implementing an exercise routine and learn your capabilities and proper form for all exercise.  Introduction to Yoga  Clinical staff conducted group or individual video education with verbal and written material and guidebook.  Patient learns about yoga, a discipline of the coming together of mind, breath, and body. The benefits of yoga include improved flexibility, improved range of motion, better posture and core strength, increased lung function, weight loss, and positive self-image. Yoga's heart health benefits include lowered blood pressure, healthier heart rate, decreased cholesterol and triglyceride levels, improved immune function, and reduced stress. Seek guidance from your physician and exercise physiologist before implementing an exercise routine and learn your capabilities and proper form for all exercise.  Medical   Aging:  Enhancing Your Quality of Life  Clinical staff conducted group or individual video education with verbal and written material and guidebook.  Patient learns key strategies and recommendations to stay in good physical health and enhance quality of life, such as prevention strategies, having an advocate, securing a Health Care Proxy and Power of Attorney, and keeping a list of medications and system for tracking them. It also discusses how to avoid risk for bone loss.  Biology of Weight Control  Clinical staff conducted group or individual video education with verbal and written material and guidebook.  Patient learns that weight gain occurs because we consume more calories than we burn (eating more, moving less). Even if your body weight is normal, you may have higher ratios of fat compared to muscle mass. Too  much body fat puts you at increased risk for cardiovascular disease, heart attack, stroke, type 2 diabetes, and obesity-related cancers. In addition to exercise, following the Pritikin Eating Plan can help reduce your risk.  Decoding Lab Results  Clinical staff conducted group or individual video education with verbal and written material and guidebook.  Patient learns that lab test reflects one measurement whose values change over time and are influenced by many factors, including medication, stress, sleep, exercise, food, hydration, pre-existing medical conditions, and more. It is recommended to use the knowledge from this video to become more involved with your lab results and evaluate your numbers to speak with your doctor.   Diseases of Our Time - Overview  Clinical staff conducted group or individual video education with verbal and written material and guidebook.  Patient learns that according to the CDC, 50% to 70% of chronic diseases (such as obesity, type 2 diabetes, elevated lipids, hypertension, and heart disease) are avoidable through lifestyle improvements including healthier food  choices, listening to satiety cues, and increased physical activity.  Sleep Disorders Clinical staff conducted group or individual video education with verbal and written material and guidebook.  Patient learns how good quality and duration of sleep are important to overall health and well-being. Patient also learns about sleep disorders and how they impact health along with recommendations to address them, including discussing with a physician.  Nutrition  Dining Out - Part 2 Clinical staff conducted group or individual video education with verbal and written material and guidebook.  Patient learns how to plan ahead and communicate in order to maximize their dining experience in a healthy and nutritious manner. Included are recommended food choices based on the type of restaurant the patient is visiting.   Fueling a Banker conducted group or individual video education with verbal and written material and guidebook.  There is a strong connection between our food choices and our health. Diseases like obesity and type 2 diabetes are very prevalent and are in large-part due to lifestyle choices. The Pritikin Eating Plan provides plenty of food and hunger-curbing satisfaction. It is easy to follow, affordable, and helps reduce health risks.  Menu Workshop  Clinical staff conducted group or individual video education with verbal and written material and guidebook.  Patient learns that restaurant meals can sabotage health goals because they are often packed with calories, fat, sodium, and sugar. Recommendations include strategies to plan ahead and to communicate with the manager, chef, or server to help order a healthier meal.  Planning Your Eating Strategy  Clinical staff conducted group or individual video education with verbal and written material and guidebook.  Patient learns about the Pritikin Eating Plan and its benefit of reducing the risk of disease. The Pritikin Eating  Plan does not focus on calories. Instead, it emphasizes high-quality, nutrient-rich foods. By knowing the characteristics of the foods, we choose, we can determine their calorie density and make informed decisions.  Targeting Your Nutrition Priorities  Clinical staff conducted group or individual video education with verbal and written material and guidebook.  Patient learns that lifestyle habits have a tremendous impact on disease risk and progression. This video provides eating and physical activity recommendations based on your personal health goals, such as reducing LDL cholesterol, losing weight, preventing or controlling type 2 diabetes, and reducing high blood pressure.  Vitamins and Minerals  Clinical staff conducted group or individual video education with verbal and written material and guidebook.  Patient learns different ways  to obtain key vitamins and minerals, including through a recommended healthy diet. It is important to discuss all supplements you take with your doctor.   Healthy Mind-Set    Smoking Cessation  Clinical staff conducted group or individual video education with verbal and written material and guidebook.  Patient learns that cigarette smoking and tobacco addiction pose a serious health risk which affects millions of people. Stopping smoking will significantly reduce the risk of heart disease, lung disease, and many forms of cancer. Recommended strategies for quitting are covered, including working with your doctor to develop a successful plan.  Culinary   Becoming a Set designer conducted group or individual video education with verbal and written material and guidebook.  Patient learns that cooking at home can be healthy, cost-effective, quick, and puts them in control. Keys to cooking healthy recipes will include looking at your recipe, assessing your equipment needs, planning ahead, making it simple, choosing cost-effective seasonal ingredients,  and limiting the use of added fats, salts, and sugars.  Cooking - Breakfast and Snacks  Clinical staff conducted group or individual video education with verbal and written material and guidebook.  Patient learns how important breakfast is to satiety and nutrition through the entire day. Recommendations include key foods to eat during breakfast to help stabilize blood sugar levels and to prevent overeating at meals later in the day. Planning ahead is also a key component.  Cooking - Educational psychologist conducted group or individual video education with verbal and written material and guidebook.  Patient learns eating strategies to improve overall health, including an approach to cook more at home. Recommendations include thinking of animal protein as a side on your plate rather than center stage and focusing instead on lower calorie dense options like vegetables, fruits, whole grains, and plant-based proteins, such as beans. Making sauces in large quantities to freeze for later and leaving the skin on your vegetables are also recommended to maximize your experience.  Cooking - Healthy Salads and Dressing Clinical staff conducted group or individual video education with verbal and written material and guidebook.  Patient learns that vegetables, fruits, whole grains, and legumes are the foundations of the Pritikin Eating Plan. Recommendations include how to incorporate each of these in flavorful and healthy salads, and how to create homemade salad dressings. Proper handling of ingredients is also covered. Cooking - Soups and [employer] - Soups and Desserts Clinical staff conducted group or individual video education with verbal and written material and guidebook.  Patient learns that Pritikin soups and desserts make for easy, nutritious, and delicious snacks and meal components that are low in sodium, fat, sugar, and calorie density, while high in vitamins, minerals, and filling  fiber. Recommendations include simple and healthy ideas for soups and desserts.   Overview     The Pritikin Solution Program Overview Clinical staff conducted group or individual video education with verbal and written material and guidebook.  Patient learns that the results of the Pritikin Program have been documented in more than 100 articles published in peer-reviewed journals, and the benefits include reducing risk factors for (and, in some cases, even reversing) high cholesterol, high blood pressure, type 2 diabetes, obesity, and more! An overview of the three key pillars of the Pritikin Program will be covered: eating well, doing regular exercise, and having a healthy mind-set.  WORKSHOPS  Exercise: Exercise Basics: Building Your Action Plan Clinical staff led group instruction and group discussion with PowerPoint  presentation and patient guidebook. To enhance the learning environment the use of posters, models and videos may be added. At the conclusion of this workshop, patients will comprehend the difference between physical activity and exercise, as well as the benefits of incorporating both, into their routine. Patients will understand the FITT (Frequency, Intensity, Time, and Type) principle and how to use it to build an exercise action plan. In addition, safety concerns and other considerations for exercise and cardiac rehab will be addressed by the presenter. The purpose of this lesson is to promote a comprehensive and effective weekly exercise routine in order to improve patients' overall level of fitness.   Managing Heart Disease: Your Path to a Healthier Heart Clinical staff led group instruction and group discussion with PowerPoint presentation and patient guidebook. To enhance the learning environment the use of posters, models and videos may be added.At the conclusion of this workshop, patients will understand the anatomy and physiology of the heart. Additionally, they will  understand how Pritikin's three pillars impact the risk factors, the progression, and the management of heart disease.  The purpose of this lesson is to provide a high-level overview of the heart, heart disease, and how the Pritikin lifestyle positively impacts risk factors.  Exercise Biomechanics Clinical staff led group instruction and group discussion with PowerPoint presentation and patient guidebook. To enhance the learning environment the use of posters, models and videos may be added. Patients will learn how the structural parts of their bodies function and how these functions impact their daily activities, movement, and exercise. Patients will learn how to promote a neutral spine, learn how to manage pain, and identify ways to improve their physical movement in order to promote healthy living. The purpose of this lesson is to expose patients to common physical limitations that impact physical activity. Participants will learn practical ways to adapt and manage aches and pains, and to minimize their effect on regular exercise. Patients will learn how to maintain good posture while sitting, walking, and lifting.  Balance Training and Fall Prevention  Clinical staff led group instruction and group discussion with PowerPoint presentation and patient guidebook. To enhance the learning environment the use of posters, models and videos may be added. At the conclusion of this workshop, patients will understand the importance of their sensorimotor skills (vision, proprioception, and the vestibular system) in maintaining their ability to balance as they age. Patients will apply a variety of balancing exercises that are appropriate for their current level of function. Patients will understand the common causes for poor balance, possible solutions to these problems, and ways to modify their physical environment in order to minimize their fall risk. The purpose of this lesson is to teach patients  about the importance of maintaining balance as they age and ways to minimize their risk of falling.  WORKSHOPS   Nutrition:  Fueling a Ship broker led group instruction and group discussion with PowerPoint presentation and patient guidebook. To enhance the learning environment the use of posters, models and videos may be added. Patients will review the foundational principles of the Pritikin Eating Plan and understand what constitutes a serving size in each of the food groups. Patients will also learn Pritikin-friendly foods that are better choices when away from home and review make-ahead meal and snack options. Calorie density will be reviewed and applied to three nutrition priorities: weight maintenance, weight loss, and weight gain. The purpose of this lesson is to reinforce (in a group setting) the key concepts around  what patients are recommended to eat and how to apply these guidelines when away from home by planning and selecting Pritikin-friendly options. Patients will understand how calorie density may be adjusted for different weight management goals.  Mindful Eating  Clinical staff led group instruction and group discussion with PowerPoint presentation and patient guidebook. To enhance the learning environment the use of posters, models and videos may be added. Patients will briefly review the concepts of the Pritikin Eating Plan and the importance of low-calorie dense foods. The concept of mindful eating will be introduced as well as the importance of paying attention to internal hunger signals. Triggers for non-hunger eating and techniques for dealing with triggers will be explored. The purpose of this lesson is to provide patients with the opportunity to review the basic principles of the Pritikin Eating Plan, discuss the value of eating mindfully and how to measure internal cues of hunger and fullness using the Hunger Scale. Patients will also discuss reasons for non-hunger  eating and learn strategies to use for controlling emotional eating.  Targeting Your Nutrition Priorities Clinical staff led group instruction and group discussion with PowerPoint presentation and patient guidebook. To enhance the learning environment the use of posters, models and videos may be added. Patients will learn how to determine their genetic susceptibility to disease by reviewing their family history. Patients will gain insight into the importance of diet as part of an overall healthy lifestyle in mitigating the impact of genetics and other environmental insults. The purpose of this lesson is to provide patients with the opportunity to assess their personal nutrition priorities by looking at their family history, their own health history and current risk factors. Patients will also be able to discuss ways of prioritizing and modifying the [facility] Eating Plan for their highest risk areas  Menu  Clinical staff led group instruction and group discussion with PowerPoint presentation and patient guidebook. To enhance the learning environment the use of posters, models and videos may be added. Using menus brought in from [employer], or printed from [employer], patients will apply the [facility] dining out guidelines that were presented in the [employer] video. Patients will also be able to practice these guidelines in a variety of provided scenarios. The purpose of this lesson is to provide patients with the opportunity to practice hands-on learning of the [facility] Dining Out guidelines with actual menus and practice scenarios.  Label Reading Clinical staff led group instruction and group discussion with PowerPoint presentation and patient guidebook. To enhance the learning environment the use of posters, models and videos may be added. Patients will review and discuss the [facility] label reading guidelines presented in [employer]'s Label Reading Educational series video.  Using fool labels brought in from local grocery stores and markets, patients will apply the label reading guidelines and determine if the packaged food meet the [facility] guidelines. The purpose of this lesson is to provide patients with the opportunity to review, discuss, and practice hands-on learning of the [facility] Label Reading guidelines with actual packaged food labels. Cooking School  [facility]'s [employer] are designed to teach patients ways to prepare quick, simple, and affordable recipes at home. The importance of nutrition's role in chronic disease risk reduction is reflected in its emphasis in the overall [facility] program. By learning how to prepare essential core [facility] Eating Plan recipes, patients will increase control over what they eat; be able to customize the flavor of foods without the use of added salt, sugar, or  fat; and improve the quality of the food they consume. By learning a set of core recipes which are easily assembled, quickly prepared, and affordable, patients are more likely to prepare more healthy foods at home. These workshops focus on convenient breakfasts, simple entres, side dishes, and desserts which can be prepared with minimal effort and are consistent with nutrition recommendations for cardiovascular risk reduction. Cooking [employer] are taught by a Armed forces logistics/support/administrative officer (RD) who has been trained by the [employer]. The chef or RD has a clear understanding of the importance of minimizing - if not completely eliminating - added fat, sugar, and sodium in recipes. Throughout the series of Cooking School Workshop sessions, patients will learn about healthy ingredients and efficient methods of cooking to build confidence in their capability to prepare    Cooking School weekly topics:  Adding Flavor- Sodium-Free  Fast and Healthy Breakfasts  Powerhouse Plant-Based Proteins  Satisfying Salads and Dressings  Simple Sides and  Sauces  International Cuisine-Spotlight on the [employer] Zones  Delicious Desserts  Savory Soups  [employer] - Meals in a Astronomer Appetizers and Snacks  Comforting Weekend Breakfasts  One-Pot Wonders   Fast Evening Meals  Landscape architect Your Prit[facility]in Plate  WORKSHOPS   Healthy Mindset (Psychosocial):  Focused Goals, Sustainable Changes Clinical staff led group instruction and group discussion with PowerPoint presentation and patient guidebook. To enhance the learning environment the use of posters, models and videos may be added. Patients will be able to apply effective goal setting strategies to establish at least one personal goal, and then take consistent, meaningful action toward that goal. They will learn to identify common barriers to achieving personal goals and develop strategies to overcome them. Patients will also gain an understanding of how our mind-set can impact our ability to achieve goals and the importance of cultivating a positive and growth-oriented mind-set. The purpose of this lesson is to provide patients with a deeper understanding of how to set and achieve personal goals, as well as the tools and strategies needed to overcome common obstacles which may arise along the way.  From Head to Heart: The Power of a Healthy Outlook  Clinical staff led group instruction and group discussion with PowerPoint presentation and patient guidebook. To enhance the learning environment the use of posters, models and videos may be added. Patients will be able to recognize and describe the impact of emotions and mood on physical health. They will discover the importance of self-care and explore self-care practices which may work for them. Patients will also learn how to utilize the 4 C's to cultivate a healthier outlook and better manage stress and challenges. The purpose of this lesson is to demonstrate to patients how a healthy outlook is an essential part of  maintaining good health, especially as they continue their cardiac rehab journey.  Healthy Sleep for a Healthy Heart Clinical staff led group instruction and group discussion with PowerPoint presentation and patient guidebook. To enhance the learning environment the use of posters, models and videos may be added. At the conclusion of this workshop, patients will be able to demonstrate knowledge of the importance of sleep to overall health, well-being, and quality of life. They will understand the symptoms of, and treatments for, common sleep disorders. Patients will also be able to identify daytime and nighttime behaviors which impact sleep, and they will be able to apply these tools to help manage sleep-related challenges. The purpose of this lesson  is to provide patients with a general overview of sleep and outline the importance of quality sleep. Patients will learn about a few of the most common sleep disorders. Patients will also be introduced to the concept of "sleep hygiene," and discover ways to self-manage certain sleeping problems through simple daily behavior changes. Finally, the workshop will motivate patients by clarifying the links between quality sleep and their goals of heart-healthy living.   Recognizing and Reducing Stress Clinical staff led group instruction and group discussion with PowerPoint presentation and patient guidebook. To enhance the learning environment the use of posters, models and videos may be added. At the conclusion of this workshop, patients will be able to understand the types of stress reactions, differentiate between acute and chronic stress, and recognize the impact that chronic stress has on their health. They will also be able to apply different coping mechanisms, such as reframing negative self-talk. Patients will have the opportunity to practice a variety of stress management techniques, such as deep abdominal breathing, progressive muscle relaxation, and/or  guided imagery.  The purpose of this lesson is to educate patients on the role of stress in their lives and to provide healthy techniques for coping with it.  Learning Barriers/Preferences:  Learning Barriers/Preferences - 02/01/23 1059       Learning Barriers/Preferences   Learning Barriers None    Learning Preferences Skilled Demonstration             Education Topics:  Knowledge Questionnaire Score:  Knowledge Questionnaire Score - 02/01/23 1101       Knowledge Questionnaire Score   Pre Score 19/24             Core Components/Risk Factors/Patient Goals at Admission:  Personal Goals and Risk Factors at Admission - 02/01/23 1058       Core Components/Risk Factors/Patient Goals on Admission   Lipids Yes    Intervention Provide education and support for participant on nutrition & aerobic/resistive exercise along with prescribed medications to achieve LDL 70mg , HDL >40mg .    Expected Outcomes Short Term: Participant states understanding of desired cholesterol values and is compliant with medications prescribed. Participant is following exercise prescription and nutrition guidelines.;Long Term: Cholesterol controlled with medications as prescribed, with individualized exercise RX and with personalized nutrition plan. Value goals: LDL < 70mg , HDL > 40 mg.    Stress Yes    Intervention Offer individual and/or small group education and counseling on adjustment to heart disease, stress management and health-related lifestyle change. Teach and support self-help strategies.;Refer participants experiencing significant psychosocial distress to appropriate mental health specialists for further evaluation and treatment. When possible, include family members and significant others in education/counseling sessions.    Expected Outcomes Short Term: Participant demonstrates changes in health-related behavior, relaxation and other stress management skills, ability to obtain effective social  support, and compliance with psychotropic medications if prescribed.;Long Term: Emotional wellbeing is indicated by absence of clinically significant psychosocial distress or social isolation.             Core Components/Risk Factors/Patient Goals Review:   Goals and Risk Factor Review     Row Name 02/22/23 1052             Core Components/Risk Factors/Patient Goals Review   Personal Goals Review Weight Management/Obesity;Lipids;Stress       Review Deirdre is off to a good start to exercise at cardiac rehab. Vital signs have been stable. Sirine is currently out with an URI       Expected  Outcomes San will continue to participate in cardiac rehab for exercise, nutrition and lifestyle modifications                Core Components/Risk Factors/Patient Goals at Discharge (Final Review):   Goals and Risk Factor Review - 02/22/23 1052       Core Components/Risk Factors/Patient Goals Review   Personal Goals Review Weight Management/Obesity;Lipids;Stress    Review Traneisha is off to a good start to exercise at cardiac rehab. Vital signs have been stable. Priyal is currently out with an URI    Expected Outcomes Laura-Lee will continue to participate in cardiac rehab for exercise, nutrition and lifestyle modifications             ITP Comments:  ITP Comments     Row Name 02/01/23 1024 02/22/23 1030         ITP Comments Medical Director- Dr. Armanda Magic, MD. Introduction to the [facility] Education Program / Intensive Cardiac Rehab. Initial orientation packet reviewed with the patient today. 30 Day ITP Review. Vashtie is off to a good start to exercise at cardiac rehab. Alec is currently abesnt from cardiac rehab due to a recent URI.               Comments: See ITP comments

## 2023-02-23 ENCOUNTER — Encounter (HOSPITAL_COMMUNITY): Payer: Medicare Other

## 2023-02-25 ENCOUNTER — Other Ambulatory Visit: Payer: Self-pay | Admitting: Family Medicine

## 2023-02-25 ENCOUNTER — Encounter (HOSPITAL_COMMUNITY): Payer: Medicare Other

## 2023-03-01 DIAGNOSIS — K080 Exfoliation of teeth due to systemic causes: Secondary | ICD-10-CM | POA: Diagnosis not present

## 2023-03-02 ENCOUNTER — Encounter (HOSPITAL_COMMUNITY)
Admission: RE | Admit: 2023-03-02 | Discharge: 2023-03-02 | Disposition: A | Discharge status: Home / Self Care | Payer: Medicare Other | Source: Ambulatory Visit | Attending: Cardiology | Admitting: Cardiology

## 2023-03-02 DIAGNOSIS — Z48812 Encounter for surgical aftercare following surgery on the circulatory system: Secondary | ICD-10-CM | POA: Insufficient documentation

## 2023-03-02 DIAGNOSIS — Z955 Presence of coronary angioplasty implant and graft: Secondary | ICD-10-CM | POA: Diagnosis not present

## 2023-03-04 ENCOUNTER — Encounter (HOSPITAL_COMMUNITY)
Admission: RE | Admit: 2023-03-04 | Discharge: 2023-03-04 | Disposition: A | Discharge status: Home / Self Care | Payer: Medicare Other | Source: Ambulatory Visit | Attending: Cardiology | Admitting: Cardiology

## 2023-03-04 DIAGNOSIS — Z955 Presence of coronary angioplasty implant and graft: Secondary | ICD-10-CM | POA: Diagnosis not present

## 2023-03-04 DIAGNOSIS — Z48812 Encounter for surgical aftercare following surgery on the circulatory system: Secondary | ICD-10-CM | POA: Diagnosis not present

## 2023-03-07 ENCOUNTER — Encounter (HOSPITAL_COMMUNITY)
Admission: RE | Admit: 2023-03-07 | Discharge: 2023-03-07 | Disposition: A | Discharge status: Home / Self Care | Payer: Medicare Other | Source: Ambulatory Visit | Attending: Cardiology

## 2023-03-07 DIAGNOSIS — Z48812 Encounter for surgical aftercare following surgery on the circulatory system: Secondary | ICD-10-CM | POA: Diagnosis not present

## 2023-03-07 DIAGNOSIS — Z955 Presence of coronary angioplasty implant and graft: Secondary | ICD-10-CM | POA: Diagnosis not present

## 2023-03-09 ENCOUNTER — Encounter (HOSPITAL_COMMUNITY): Payer: Medicare Other

## 2023-03-11 ENCOUNTER — Encounter (HOSPITAL_COMMUNITY)
Admission: RE | Admit: 2023-03-11 | Discharge: 2023-03-11 | Disposition: A | Discharge status: Home / Self Care | Payer: Medicare Other | Source: Ambulatory Visit | Attending: Cardiology | Admitting: Cardiology

## 2023-03-11 DIAGNOSIS — Z955 Presence of coronary angioplasty implant and graft: Secondary | ICD-10-CM

## 2023-03-11 DIAGNOSIS — Z48812 Encounter for surgical aftercare following surgery on the circulatory system: Secondary | ICD-10-CM | POA: Diagnosis not present

## 2023-03-14 ENCOUNTER — Encounter (HOSPITAL_COMMUNITY)
Admission: RE | Admit: 2023-03-14 | Discharge: 2023-03-14 | Disposition: A | Discharge status: Home / Self Care | Payer: Medicare Other | Source: Ambulatory Visit | Attending: Cardiology | Admitting: Cardiology

## 2023-03-14 DIAGNOSIS — Z48812 Encounter for surgical aftercare following surgery on the circulatory system: Secondary | ICD-10-CM | POA: Diagnosis not present

## 2023-03-14 DIAGNOSIS — Z955 Presence of coronary angioplasty implant and graft: Secondary | ICD-10-CM | POA: Diagnosis not present

## 2023-03-16 ENCOUNTER — Encounter (HOSPITAL_COMMUNITY)
Admission: RE | Admit: 2023-03-16 | Discharge: 2023-03-16 | Disposition: A | Discharge status: Home / Self Care | Payer: Medicare Other | Source: Ambulatory Visit | Attending: Cardiology

## 2023-03-16 DIAGNOSIS — Z955 Presence of coronary angioplasty implant and graft: Secondary | ICD-10-CM | POA: Diagnosis not present

## 2023-03-16 DIAGNOSIS — Z48812 Encounter for surgical aftercare following surgery on the circulatory system: Secondary | ICD-10-CM | POA: Diagnosis not present

## 2023-03-18 ENCOUNTER — Encounter (HOSPITAL_COMMUNITY)
Admission: RE | Admit: 2023-03-18 | Discharge: 2023-03-18 | Disposition: A | Discharge status: Home / Self Care | Payer: Medicare Other | Source: Ambulatory Visit | Attending: Cardiology | Admitting: Cardiology

## 2023-03-18 DIAGNOSIS — Z48812 Encounter for surgical aftercare following surgery on the circulatory system: Secondary | ICD-10-CM | POA: Diagnosis not present

## 2023-03-18 DIAGNOSIS — Z955 Presence of coronary angioplasty implant and graft: Secondary | ICD-10-CM

## 2023-03-21 ENCOUNTER — Encounter (HOSPITAL_COMMUNITY)
Admission: RE | Admit: 2023-03-21 | Discharge: 2023-03-21 | Disposition: A | Discharge status: Home / Self Care | Payer: Medicare Other | Source: Ambulatory Visit | Attending: Cardiology | Admitting: Cardiology

## 2023-03-21 DIAGNOSIS — Z48812 Encounter for surgical aftercare following surgery on the circulatory system: Secondary | ICD-10-CM | POA: Diagnosis not present

## 2023-03-21 DIAGNOSIS — Z955 Presence of coronary angioplasty implant and graft: Secondary | ICD-10-CM | POA: Diagnosis not present

## 2023-03-22 NOTE — Progress Notes (Signed)
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               Nutrition Goals Discharge (Final Nutrition Goals Re-Evaluation):  Nutrition Goals Re-Evaluation - 03/11/23 1021       Goals   Current Weight 167 lb 12.3 oz (76.1 kg)    Comment no new labs; most recent labs total cholesterol 214, LDL 144 (lipitor is listed as an allergy), A1c 5.9    Expected Outcome Goals in progress. [name] continues to attend the [facility] education and nutrition series reguarly. She has improved understanding of benefits of increased fiber intake, saturated fat recommendations, and reading food labels for sodium/sugar. She reports prioritizing high fiber foods and lean proteins in her diet. She reports no nutrition questions/concerns at this time. She is down 2.2# since starting with our program. Patient will benefit from participation in intensive cardiac rehab for nutrition, exercise, and lifestyle modification.              Psychosocial: Target Goals: Acknowledge presence or absence of significant depression and/or stress, maximize coping skills, provide positive support system. Participant is able to verbalize types and ability to use techniques and skills  needed for reducing stress and depression.  Initial Review & Psychosocial Screening:  Initial Psych Review & Screening - 02/01/23 1108       Initial Review   Current issues with History of Depression;Current Stress Concerns    Source of Stress Concerns Family    Comments [name] takes care of her husband with diabetes.      Family Dynamics   Good Support System? 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 She talks to her preacher and a friend for support when needed.      Barriers   Psychosocial barriers to participate in program The patient should benefit from training in stress management and relaxation.      Screening Interventions   Interventions Encouraged to exercise;Provide feedback about the scores to participant    Expected Outcomes Short Term goal: Utilizing psychosocial counselor, staff and physician to assist with identification of specific Stressors or current issues interfering with healing process. Setting desired goal for each stressor or current issue identified.;Long Term Goal: Stressors or current issues are controlled or eliminated.;Short Term goal: Identification and review with participant of any Quality of Life or Depression concerns found by scoring the questionnaire.;Long Term goal: The participant improves quality of Life and PHQ9 Scores as seen by post scores and/or verbalization of changes             Quality of Life Scores:  Quality of Life - 02/01/23 1536       Quality of Life   Select Quality of Life      Quality of Life Scores   Health/Function Pre 21.3 %    Socioeconomic Pre 24.2 %    Psych/Spiritual Pre 22.93 %    Family Pre 20.1 %    GLOBAL Pre 21.84 %            Scores of  19 and below usually indicate a poorer quality of life in these areas.  A difference of  2-3 points is a clinically meaningful difference.  A difference of 2-3 points in the total score of the Quality of Life Index has been associated with significant improvement in overall quality of life, self-image, physical symptoms, and general health in studies assessing change in quality of life.  PHQ-9: Review Flowsheet  More data exists      02/01/2023 01/10/2023 10/28/2022 07/26/2022 07/21/2022  Depression screen PHQ 2/9  Decreased Interest 0 0 0 0 0  Down, Depressed, Hopeless 0 0 0 1 0  PHQ - 2 Score 0 0 0 1 0  Altered sleeping 1 - 0 0 -  Tired, decreased energy 1 - 0 0 -  Change in appetite 0 - 0 0 -  Feeling bad or failure about yourself  0 - 0 0 -  Trouble concentrating 0 - 0 0 -  Moving slowly or fidgety/restless 0 - 0 0 -  Suicidal thoughts 0 - 0 0 -  PHQ-9 Score 2 - 0 1 -  Difficult doing work/chores Somewhat difficult - Not difficult at all Not difficult at all -    Details           Interpretation of Total Score  Total Score Depression Severity:  1-4 = Minimal depression, 5-9 = Mild depression, 10-14 = Moderate depression, 15-19 = Moderately severe depression, 20-27 = Severe depression   Psychosocial Evaluation and Intervention:   Psychosocial Re-Evaluation:  Psychosocial Re-Evaluation     Row Name 02/22/23 1040 03/22/23 1058  Psychosocial Re-Evaluation   Current issues with History of Depression;Current Stress Concerns History of Depression;Current Stress Concerns      Comments [name] has not voiced any increased concerns or stressors during exercise at cardiac rehab. [name] is currently out witha URI. [name] has not voiced any increased concerns or stressors during exercise at cardiac rehab.      Expected Outcomes [name] will have decreased or controlled stress upon completion of cardiac rehab [name] will have decreased or controlled stress upon completion of cardiac  rehab      Interventions Stress management education;Encouraged to attend Cardiac Rehabilitation for the exercise;Relaxation education Stress management education;Encouraged to attend Cardiac Rehabilitation for the exercise;Relaxation education      Continue Psychosocial Services  No Follow up required No Follow up required        Initial Review   Source of Stress Concerns Family Family      Comments Will continue to monitor and offer support as needed Will continue to monitor and offer support as needed               Psychosocial Discharge (Final Psychosocial Re-Evaluation):  Psychosocial Re-Evaluation - 03/22/23 1058       Psychosocial Re-Evaluation   Current issues with History of Depression;Current Stress Concerns    Comments [name] has not voiced any increased concerns or stressors during exercise at cardiac rehab.    Expected Outcomes [name] will have decreased or controlled stress upon completion of cardiac rehab    Interventions Stress management education;Encouraged to attend Cardiac Rehabilitation for the exercise;Relaxation education    Continue Psychosocial Services  No Follow up required      Initial Review   Source of Stress Concerns Family    Comments Will continue to monitor and offer support as needed             Vocational Rehabilitation: Provide vocational rehab assistance to qualifying candidates.   Vocational Rehab Evaluation & Intervention:  Vocational Rehab - 02/01/23 1059       Initial Vocational Rehab Evaluation & Intervention   Assessment shows need for Vocational Rehabilitation No   Retired            Education: Education Goals: Education classes will be provided on a weekly basis, covering required topics. Participant will state understanding/return demonstration of topics presented.    Education     Row Name 02/09/23 1000     Education   Cardiac Education Topics Pritikin   Secondary school teacher School   Educator  Dietitian   Weekly Topic Powerhouse Plant-Based Proteins   Instruction Review Code 1- Verbalizes Understanding   Class Start Time 0815   Class Stop Time 0856   Class Time Calculation (min) 41 min    Row Name 02/11/23 0900     Education   Cardiac Education Topics Pritikin   Select Core Videos     Core Videos   Educator Exercise Physiologist   Select General Education   General Education Hypertension and Heart Disease   Instruction Review Code 1- Verbalizes Understanding   Class Start Time 0810   Class Stop Time 0850   Class Time Calculation (min) 40 min    Row Name 02/14/23 0900     Education   Cardiac Education Topics Pritikin   Geographical information systems officer Psychosocial   Psychosocial Workshop From Head to Heart: The Power of a Social research officer, government  Review Code 1- Verbalizes Understanding   Class Start Time 0815   Class Stop Time 0904   Class Time Calculation (min) 49 min    Row Name 02/16/23 1000     Education   Cardiac Education Topics Pritikin   Secondary school teacher School   Educator Dietitian   Weekly Topic Adding Flavor - Sodium-Free   Instruction Review Code 1- Verbalizes Understanding   Class Start Time [phone]   Class Stop Time 0845   Class Time Calculation (min) 31 min    Row Name 03/02/23 1000     Education   Cardiac Education Topics Pritikin   Secondary school teacher School   Educator Dietitian   Weekly Topic Personalizing Your Pritikin Plate   Instruction Review Code 1- Verbalizes Understanding   Class Start Time 0815   Class Stop Time 0848   Class Time Calculation (min) 33 min    Row Name 03/04/23 0900     Education   Cardiac Education Topics Pritikin   Select Workshops     Workshops   Educator Exercise Physiologist   Select Exercise   Exercise Workshop Location manager and Fall Prevention   Instruction Review Code 1- Verbalizes Understanding   Class Start  Time [phone]   Class Stop Time 0856   Class Time Calculation (min) 45 min    Row Name 03/07/23 0900     Education   Cardiac Education Topics Pritikin   Glass blower/designer Nutrition   Nutrition Workshop Label Reading   Instruction Review Code 1- Verbalizes Understanding   Class Start Time 0815   Class Stop Time 0904   Class Time Calculation (min) 49 min    Row Name 03/11/23 1000     Education   Cardiac Education Topics Pritikin   Nurse, children's   Educator Dietitian   Select Nutrition   Nutrition Other  Label Reading   Instruction Review Code 1- Verbalizes Understanding   Class Start Time 0815   Class Stop Time 0900   Class Time Calculation (min) 45 min    Row Name 03/14/23 1100     Education   Cardiac Education Topics Pritikin   Select Workshops     Workshops   Educator Exercise Physiologist   Select Psychosocial   Psychosocial Workshop Recognizing and Reducing Stress   Instruction Review Code 1- Verbalizes Understanding   Class Start Time 0815   Class Stop Time 0900   Class Time Calculation (min) 45 min    Row Name 03/16/23 1000     Education   Cardiac Education Topics Pritikin   Secondary school teacher School   Educator Dietitian   Weekly Topic Tasty Appetizers and Snacks   Instruction Review Code 1- Verbalizes Understanding   Class Start Time 0815   Class Stop Time 0900   Class Time Calculation (min) 45 min    Row Name 03/18/23 1300     Education   Cardiac Education Topics Pritikin   Select Core Videos     Core Videos   Educator Exercise Physiologist   Select Nutrition   Nutrition Calorie Density   Instruction Review Code 1- Verbalizes Understanding   Class Start Time [phone]   Class Stop Time 0900   Class Time Calculation (min) 44 min    Row Name 03/21/23 0900  Education   Cardiac Education Topics Pritikin   Arts administrator Exercise   Exercise Workshop Exercise Basics: Diplomatic Services operational officer   Instruction Review Code 1- Verbalizes Understanding   Class Start Time 0813   Class Stop Time 0900   Class Time Calculation (min) 47 min            Core Videos: Exercise    Move It!  Clinical staff conducted group or individual video education with verbal and written material and guidebook.  Patient learns the recommended Pritikin exercise program. Exercise with the goal of living a long, healthy life. Some of the health benefits of exercise include controlled diabetes, healthier blood pressure levels, improved cholesterol levels, improved heart and lung capacity, improved sleep, and better body composition. Everyone should speak with their doctor before starting or changing an exercise routine.  Biomechanical Limitations Clinical staff conducted group or individual video education with verbal and written material and guidebook.  Patient learns how biomechanical limitations can impact exercise and how we can mitigate and possibly overcome limitations to have an impactful and balanced exercise routine.  Body Composition Clinical staff conducted group or individual video education with verbal and written material and guidebook.  Patient learns that body composition (ratio of muscle mass to fat mass) is a key component to assessing overall fitness, rather than body weight alone. Increased fat mass, especially visceral belly fat, can put [place] at increased risk for metabolic syndrome, type 2 diabetes, heart disease, and even death. It is recommended to combine diet and exercise (cardiovascular and resistance training) to improve your body composition. Seek guidance from your physician and exercise physiologist before implementing an exercise routine.  Exercise Action Plan Clinical staff conducted group or individual video education with verbal and written material and guidebook.  Patient learns the  recommended strategies to achieve and enjoy long-term exercise adherence, including variety, self-motivation, self-efficacy, and positive decision making. Benefits of exercise include fitness, good health, weight management, more energy, better sleep, less stress, and overall well-being.  Medical   Heart Disease Risk Reduction Clinical staff conducted group or individual video education with verbal and written material and guidebook.  Patient learns our heart is our most vital organ as it circulates oxygen, nutrients, white blood cells, and hormones throughout the entire body, and carries waste away. Data supports a plant-based eating plan like the Pritikin Program for its effectiveness in slowing progression of and reversing heart disease. The video provides a number of recommendations to address heart disease.   Metabolic Syndrome and Belly Fat  Clinical staff conducted group or individual video education with verbal and written material and guidebook.  Patient learns what metabolic syndrome is, how it leads to heart disease, and how one can reverse it and keep it from coming back. You have metabolic syndrome if you have 3 of the following 5 criteria: abdominal obesity, high blood pressure, high triglycerides, low HDL cholesterol, and high blood sugar.  Hypertension and Heart Disease Clinical staff conducted group or individual video education with verbal and written material and guidebook.  Patient learns that high blood pressure, or hypertension, is very common in the [place]. Hypertension is largely due to excessive salt intake, but other important risk factors include being overweight, physical inactivity, drinking too much alcohol, smoking, and not eating enough potassium from fruits and vegetables. High blood pressure is a leading risk factor for heart attack, stroke, congestive heart failure,  dementia, kidney failure, and premature death. Long-term effects of excessive salt intake include  stiffening of the arteries and thickening of heart muscle and organ damage. Recommendations include ways to reduce hypertension and the risk of heart disease.  Diseases of Our Time - Focusing on Diabetes Clinical staff conducted group or individual video education with verbal and written material and guidebook.  Patient learns why the best way to stop diseases of our time is prevention, through food and other lifestyle changes. Medicine (such as prescription pills and surgeries) is often only a Band-Aid on the problem, not a long-term solution. Most common diseases of our time include obesity, type 2 diabetes, hypertension, heart disease, and cancer. The Pritikin Program is recommended and has been proven to help reduce, reverse, and/or prevent the damaging effects of metabolic syndrome.  Nutrition   Overview of the Pritikin Eating Plan  Clinical staff conducted group or individual video education with verbal and written material and guidebook.  Patient learns about the Pritikin Eating Plan for disease risk reduction. The Pritikin Eating Plan emphasizes a wide variety of unrefined, minimally-processed carbohydrates, like fruits, vegetables, whole grains, and legumes. Go, Caution, and Stop food choices are explained. Plant-based and lean animal proteins are emphasized. Rationale provided for low sodium intake for blood pressure control, low added sugars for blood sugar stabilization, and low added fats and oils for coronary artery disease risk reduction and weight management.  Calorie Density  Clinical staff conducted group or individual video education with verbal and written material and guidebook.  Patient learns about calorie density and how it impacts the Pritikin Eating Plan. Knowing the characteristics of the food you choose will help you decide whether those foods will lead to weight gain or weight loss, and whether you want to consume more or less of them. Weight loss is usually a side effect of  the Pritikin Eating Plan because of its focus on low calorie-dense foods.  Label Reading  Clinical staff conducted group or individual video education with verbal and written material and guidebook.  Patient learns about the Pritikin recommended label reading guidelines and corresponding recommendations regarding calorie density, added sugars, sodium content, and whole grains.  Dining Out - Part 1  Clinical staff conducted group or individual video education with verbal and written material and guidebook.  Patient learns that restaurant meals can be sabotaging because they can be so high in calories, fat, sodium, and/or sugar. Patient learns recommended strategies on how to positively address this and avoid unhealthy pitfalls.  Facts on Fats  Clinical staff conducted group or individual video education with verbal and written material and guidebook.  Patient learns that lifestyle modifications can be just as effective, if not more so, as many medications for lowering your risk of heart disease. A Pritikin lifestyle can help to reduce your risk of inflammation and atherosclerosis (cholesterol build-up, or plaque, in the artery walls). Lifestyle interventions such as dietary choices and physical activity address the cause of atherosclerosis. A review of the types of fats and their impact on blood cholesterol levels, along with dietary recommendations to reduce fat intake is also included.  Nutrition Action Plan  Clinical staff conducted group or individual video education with verbal and written material and guidebook.  Patient learns how to incorporate Pritikin recommendations into their lifestyle. Recommendations include planning and keeping personal health goals in mind as an important part of their success.  Healthy Mind-Set    Healthy Minds, Bodies, Hearts  Clinical staff conducted group or individual  video education with verbal and written material and guidebook.  Patient learns how to  identify when they are stressed. Video will discuss the impact of that stress, as well as the many benefits of stress management. Patient will also be introduced to stress management techniques. The way we think, act, and feel has an impact on our hearts.  How Our Thoughts Can Heal Our Hearts  Clinical staff conducted group or individual video education with verbal and written material and guidebook.  Patient learns that negative thoughts can cause depression and anxiety. This can result in negative lifestyle behavior and serious health problems. Cognitive behavioral therapy is an effective method to help control our thoughts in order to change and improve our emotional outlook.  Additional Videos:  Exercise    Improving Performance  Clinical staff conducted group or individual video education with verbal and written material and guidebook.  Patient learns to use a non-linear approach by alternating intensity levels and lengths of time spent exercising to help burn more calories and lose more body fat. Cardiovascular exercise helps improve heart health, metabolism, hormonal balance, blood sugar control, and recovery from fatigue. Resistance training improves strength, endurance, balance, coordination, reaction time, metabolism, and muscle mass. Flexibility exercise improves circulation, posture, and balance. Seek guidance from your physician and exercise physiologist before implementing an exercise routine and learn your capabilities and proper form for all exercise.  Introduction to Yoga  Clinical staff conducted group or individual video education with verbal and written material and guidebook.  Patient learns about yoga, a discipline of the coming together of mind, breath, and body. The benefits of yoga include improved flexibility, improved range of motion, better posture and core strength, increased lung function, weight loss, and positive self-image. Yoga's heart health benefits include lowered  blood pressure, healthier heart rate, decreased cholesterol and triglyceride levels, improved immune function, and reduced stress. Seek guidance from your physician and exercise physiologist before implementing an exercise routine and learn your capabilities and proper form for all exercise.  Medical   Aging: Enhancing Your Quality of Life  Clinical staff conducted group or individual video education with verbal and written material and guidebook.  Patient learns key strategies and recommendations to stay in good physical health and enhance quality of life, such as prevention strategies, having an advocate, securing a Health Care Proxy and Power of Attorney, and keeping a list of medications and system for tracking them. It also discusses how to avoid risk for bone loss.  Biology of Weight Control  Clinical staff conducted group or individual video education with verbal and written material and guidebook.  Patient learns that weight gain occurs because we consume more calories than we burn (eating more, moving less). Even if your body weight is normal, you may have higher ratios of fat compared to muscle mass. Too much body fat puts you at increased risk for cardiovascular disease, heart attack, stroke, type 2 diabetes, and obesity-related cancers. In addition to exercise, following the Pritikin Eating Plan can help reduce your risk.  Decoding Lab Results  Clinical staff conducted group or individual video education with verbal and written material and guidebook.  Patient learns that lab test reflects one measurement whose values change over time and are influenced by many factors, including medication, stress, sleep, exercise, food, hydration, pre-existing medical conditions, and more. It is recommended to use the knowledge from this video to become more involved with your lab results and evaluate your numbers to speak with your doctor.  Diseases of Our Time - Overview  Clinical staff conducted  group or individual video education with verbal and written material and guidebook.  Patient learns that according to the CDC, 50% to 70% of chronic diseases (such as obesity, type 2 diabetes, elevated lipids, hypertension, and heart disease) are avoidable through lifestyle improvements including healthier food choices, listening to satiety cues, and increased physical activity.  Sleep Disorders Clinical staff conducted group or individual video education with verbal and written material and guidebook.  Patient learns how good quality and duration of sleep are important to overall health and well-being. Patient also learns about sleep disorders and how they impact health along with recommendations to address them, including discussing with a physician.  Nutrition  Dining Out - Part 2 Clinical staff conducted group or individual video education with verbal and written material and guidebook.  Patient learns how to plan ahead and communicate in order to maximize their dining experience in a healthy and nutritious manner. Included are recommended food choices based on the type of restaurant the patient is visiting.   Fueling a Banker conducted group or individual video education with verbal and written material and guidebook.  There is a strong connection between our food choices and our health. Diseases like obesity and type 2 diabetes are very prevalent and are in large-part due to lifestyle choices. The Pritikin Eating Plan provides plenty of food and hunger-curbing satisfaction. It is easy to follow, affordable, and helps reduce health risks.  Menu Workshop  Clinical staff conducted group or individual video education with verbal and written material and guidebook.  Patient learns that restaurant meals can sabotage health goals because they are often packed with calories, fat, sodium, and sugar. Recommendations include strategies to plan ahead and to communicate with the  manager, chef, or server to help order a healthier meal.  Planning Your Eating Strategy  Clinical staff conducted group or individual video education with verbal and written material and guidebook.  Patient learns about the Pritikin Eating Plan and its benefit of reducing the risk of disease. The Pritikin Eating Plan does not focus on calories. Instead, it emphasizes high-quality, nutrient-rich foods. By knowing the characteristics of the foods, we choose, we can determine their calorie density and make informed decisions.  Targeting Your Nutrition Priorities  Clinical staff conducted group or individual video education with verbal and written material and guidebook.  Patient learns that lifestyle habits have a tremendous impact on disease risk and progression. This video provides eating and physical activity recommendations based on your personal health goals, such as reducing LDL cholesterol, losing weight, preventing or controlling type 2 diabetes, and reducing high blood pressure.  Vitamins and Minerals  Clinical staff conducted group or individual video education with verbal and written material and guidebook.  Patient learns different ways to obtain key vitamins and minerals, including through a recommended healthy diet. It is important to discuss all supplements you take with your doctor.   Healthy Mind-Set    Smoking Cessation  Clinical staff conducted group or individual video education with verbal and written material and guidebook.  Patient learns that cigarette smoking and tobacco addiction pose a serious health risk which affects millions of people. Stopping smoking will significantly reduce the risk of heart disease, lung disease, and many forms of cancer. Recommended strategies for quitting are covered, including working with your doctor to develop a successful plan.  Culinary   Becoming a Set designer conducted group or  individual video education with verbal and  written material and guidebook.  Patient learns that cooking at home can be healthy, cost-effective, quick, and puts them in control. Keys to cooking healthy recipes will include looking at your recipe, assessing your equipment needs, planning ahead, making it simple, choosing cost-effective seasonal ingredients, and limiting the use of added fats, salts, and sugars.  Cooking - Breakfast and Snacks  Clinical staff conducted group or individual video education with verbal and written material and guidebook.  Patient learns how important breakfast is to satiety and nutrition through the entire day. Recommendations include key foods to eat during breakfast to help stabilize blood sugar levels and to prevent overeating at meals later in the day. Planning ahead is also a key component.  Cooking - Educational psychologist conducted group or individual video education with verbal and written material and guidebook.  Patient learns eating strategies to improve overall health, including an approach to cook more at home. Recommendations include thinking of animal protein as a side on your plate rather than center stage and focusing instead on lower calorie dense options like vegetables, fruits, whole grains, and plant-based proteins, such as beans. Making sauces in large quantities to freeze for later and leaving the skin on your vegetables are also recommended to maximize your experience.  Cooking - Healthy Salads and Dressing Clinical staff conducted group or individual video education with verbal and written material and guidebook.  Patient learns that vegetables, fruits, whole grains, and legumes are the foundations of the Pritikin Eating Plan. Recommendations include how to incorporate each of these in flavorful and healthy salads, and how to create homemade salad dressings. Proper handling of ingredients is also covered. Cooking - Soups and [employer] - Soups and Desserts Clinical staff  conducted group or individual video education with verbal and written material and guidebook.  Patient learns that Pritikin soups and desserts make for easy, nutritious, and delicious snacks and meal components that are low in sodium, fat, sugar, and calorie density, while high in vitamins, minerals, and filling fiber. Recommendations include simple and healthy ideas for soups and desserts.   Overview     The Pritikin Solution Program Overview Clinical staff conducted group or individual video education with verbal and written material and guidebook.  Patient learns that the results of the Pritikin Program have been documented in more than 100 articles published in peer-reviewed journals, and the benefits include reducing risk factors for (and, in some cases, even reversing) high cholesterol, high blood pressure, type 2 diabetes, obesity, and more! An overview of the three key pillars of the Pritikin Program will be covered: eating well, doing regular exercise, and having a healthy mind-set.  WORKSHOPS  Exercise: Exercise Basics: Building Your Action Plan Clinical staff led group instruction and group discussion with PowerPoint presentation and patient guidebook. To enhance the learning environment the use of posters, models and videos may be added. At the conclusion of this workshop, patients will comprehend the difference between physical activity and exercise, as well as the benefits of incorporating both, into their routine. Patients will understand the FITT (Frequency, Intensity, Time, and Type) principle and how to use it to build an exercise action plan. In addition, safety concerns and other considerations for exercise and cardiac rehab will be addressed by the presenter. The purpose of this lesson is to promote a comprehensive and effective weekly exercise routine in order to improve patients' overall level of fitness.   Managing Heart Disease:  Your Path to a Healthier Heart Clinical  staff led group instruction and group discussion with PowerPoint presentation and patient guidebook. To enhance the learning environment the use of posters, models and videos may be added.At the conclusion of this workshop, patients will understand the anatomy and physiology of the heart. Additionally, they will understand how Pritikin's three pillars impact the risk factors, the progression, and the management of heart disease.  The purpose of this lesson is to provide a high-level overview of the heart, heart disease, and how the Pritikin lifestyle positively impacts risk factors.  Exercise Biomechanics Clinical staff led group instruction and group discussion with PowerPoint presentation and patient guidebook. To enhance the learning environment the use of posters, models and videos may be added. Patients will learn how the structural parts of their bodies function and how these functions impact their daily activities, movement, and exercise. Patients will learn how to promote a neutral spine, learn how to manage pain, and identify ways to improve their physical movement in order to promote healthy living. The purpose of this lesson is to expose patients to common physical limitations that impact physical activity. Participants will learn practical ways to adapt and manage aches and pains, and to minimize their effect on regular exercise. Patients will learn how to maintain good posture while sitting, walking, and lifting.  Balance Training and Fall Prevention  Clinical staff led group instruction and group discussion with PowerPoint presentation and patient guidebook. To enhance the learning environment the use of posters, models and videos may be added. At the conclusion of this workshop, patients will understand the importance of their sensorimotor skills (vision, proprioception, and the vestibular system) in maintaining their ability to balance as they age. Patients will apply a variety of  balancing exercises that are appropriate for their current level of function. Patients will understand the common causes for poor balance, possible solutions to these problems, and ways to modify their physical environment in order to minimize their fall risk. The purpose of this lesson is to teach patients about the importance of maintaining balance as they age and ways to minimize their risk of falling.  WORKSHOPS   Nutrition:  Fueling a Ship broker led group instruction and group discussion with PowerPoint presentation and patient guidebook. To enhance the learning environment the use of posters, models and videos may be added. Patients will review the foundational principles of the Pritikin Eating Plan and understand what constitutes a serving size in each of the food groups. Patients will also learn Pritikin-friendly foods that are better choices when away from home and review make-ahead meal and snack options. Calorie density will be reviewed and applied to three nutrition priorities: weight maintenance, weight loss, and weight gain. The purpose of this lesson is to reinforce (in a group setting) the key concepts around what patients are recommended to eat and how to apply these guidelines when away from home by planning and selecting Pritikin-friendly options. Patients will understand how calorie density may be adjusted for different weight management goals.  Mindful Eating  Clinical staff led group instruction and group discussion with PowerPoint presentation and patient guidebook. To enhance the learning environment the use of posters, models and videos may be added. Patients will briefly review the concepts of the Pritikin Eating Plan and the importance of low-calorie dense foods. The concept of mindful eating will be introduced as well as the importance of paying attention to internal hunger signals. Triggers for non-hunger eating and techniques for dealing  with triggers will be  explored. The purpose of this lesson is to provide patients with the opportunity to review the basic principles of the Pritikin Eating Plan, discuss the value of eating mindfully and how to measure internal cues of hunger and fullness using the Hunger Scale. Patients will also discuss reasons for non-hunger eating and learn strategies to use for controlling emotional eating.  Targeting Your Nutrition Priorities Clinical staff led group instruction and group discussion with PowerPoint presentation and patient guidebook. To enhance the learning environment the use of posters, models and videos may be added. Patients will learn how to determine their genetic susceptibility to disease by reviewing their family history. Patients will gain insight into the importance of diet as part of an overall healthy lifestyle in mitigating the impact of genetics and other environmental insults. The purpose of this lesson is to provide patients with the opportunity to assess their personal nutrition priorities by looking at their family history, their own health history and current risk factors. Patients will also be able to discuss ways of prioritizing and modifying the Pritikin Eating Plan for their highest risk areas  Menu  Clinical staff led group instruction and group discussion with PowerPoint presentation and patient guidebook. To enhance the learning environment the use of posters, models and videos may be added. Using menus brought in from [employer], or printed from [employer], patients will apply the Pritikin dining out guidelines that were presented in the [employer] video. Patients will also be able to practice these guidelines in a variety of provided scenarios. The purpose of this lesson is to provide patients with the opportunity to practice hands-on learning of the Pritikin Dining Out guidelines with actual menus and practice scenarios.  Label Reading Clinical staff led group  instruction and group discussion with PowerPoint presentation and patient guidebook. To enhance the learning environment the use of posters, models and videos may be added. Patients will review and discuss the Pritikin label reading guidelines presented in Pritikin's Label Reading Educational series video. Using fool labels brought in from local grocery stores and markets, patients will apply the label reading guidelines and determine if the packaged food meet the Pritikin guidelines. The purpose of this lesson is to provide patients with the opportunity to review, discuss, and practice hands-on learning of the Pritikin Label Reading guidelines with actual packaged food labels. Cooking School  Pritikin's [employer] are designed to teach patients ways to prepare quick, simple, and affordable recipes at home. The importance of nutrition's role in chronic disease risk reduction is reflected in its emphasis in the overall Pritikin program. By learning how to prepare essential core Pritikin Eating Plan recipes, patients will increase control over what they eat; be able to customize the flavor of foods without the use of added salt, sugar, or fat; and improve the quality of the food they consume. By learning a set of core recipes which are easily assembled, quickly prepared, and affordable, patients are more likely to prepare more healthy foods at home. These workshops focus on convenient breakfasts, simple entres, side dishes, and desserts which can be prepared with minimal effort and are consistent with nutrition recommendations for cardiovascular risk reduction. Cooking [employer] are taught by a Armed forces logistics/support/administrative officer (RD) who has been trained by the [employer]. The chef or RD has a clear understanding of the importance of minimizing - if not completely eliminating - added fat, sugar, and sodium in recipes. Throughout the  series of Cooking School Workshop sessions, patients  will learn about healthy ingredients and efficient methods of cooking to build confidence in their capability to prepare    Cooking School weekly topics:  Adding Flavor- Sodium-Free  Fast and Healthy Breakfasts  Powerhouse Plant-Based Proteins  Satisfying Salads and Dressings  Simple Sides and Sauces  International Cuisine-Spotlight on the [employer] Zones  Delicious Desserts  Savory Soups  [employer] - Meals in a Astronomer Appetizers and Snacks  Comforting Weekend Breakfasts  One-Pot Wonders   Fast Evening Meals  Landscape architect Your Pritikin Plate  WORKSHOPS   Healthy Mindset (Psychosocial):  Focused Goals, Sustainable Changes Clinical staff led group instruction and group discussion with PowerPoint presentation and patient guidebook. To enhance the learning environment the use of posters, models and videos may be added. Patients will be able to apply effective goal setting strategies to establish at least one personal goal, and then take consistent, meaningful action toward that goal. They will learn to identify common barriers to achieving personal goals and develop strategies to overcome them. Patients will also gain an understanding of how our mind-set can impact our ability to achieve goals and the importance of cultivating a positive and growth-oriented mind-set. The purpose of this lesson is to provide patients with a deeper understanding of how to set and achieve personal goals, as well as the tools and strategies needed to overcome common obstacles which may arise along the way.  From Head to Heart: The Power of a Healthy Outlook  Clinical staff led group instruction and group discussion with PowerPoint presentation and patient guidebook. To enhance the learning environment the use of posters, models and videos may be added. Patients will be able to recognize and describe the impact of emotions and mood on physical health. They will discover the importance  of self-care and explore self-care practices which may work for them. Patients will also learn how to utilize the 4 C's to cultivate a healthier outlook and better manage stress and challenges. The purpose of this lesson is to demonstrate to patients how a healthy outlook is an essential part of maintaining good health, especially as they continue their cardiac rehab journey.  Healthy Sleep for a Healthy Heart Clinical staff led group instruction and group discussion with PowerPoint presentation and patient guidebook. To enhance the learning environment the use of posters, models and videos may be added. At the conclusion of this workshop, patients will be able to demonstrate knowledge of the importance of sleep to overall health, well-being, and quality of life. They will understand the symptoms of, and treatments for, common sleep disorders. Patients will also be able to identify daytime and nighttime behaviors which impact sleep, and they will be able to apply these tools to help manage sleep-related challenges. The purpose of this lesson is to provide patients with a general overview of sleep and outline the importance of quality sleep. Patients will learn about a few of the most common sleep disorders. Patients will also be introduced to the concept of "sleep hygiene," and discover ways to self-manage certain sleeping problems through simple daily behavior changes. Finally, the workshop will motivate patients by clarifying the links between quality sleep and their goals of heart-healthy living.   Recognizing and Reducing Stress Clinical staff led group instruction and group discussion with PowerPoint presentation and patient guidebook. To enhance the learning environment the use of posters, models and videos may be added. At the conclusion of this workshop, patients will  be able to understand the types of stress reactions, differentiate between acute and chronic stress, and recognize the impact that  chronic stress has on their health. They will also be able to apply different coping mechanisms, such as reframing negative self-talk. Patients will have the opportunity to practice a variety of stress management techniques, such as deep abdominal breathing, progressive muscle relaxation, and/or guided imagery.  The purpose of this lesson is to educate patients on the role of stress in their lives and to provide healthy techniques for coping with it.  Learning Barriers/Preferences:  Learning Barriers/Preferences - 02/01/23 1059       Learning Barriers/Preferences   Learning Barriers None    Learning Preferences Skilled Demonstration             Education Topics:  Knowledge Questionnaire Score:  Knowledge Questionnaire Score - 02/01/23 1101       Knowledge Questionnaire Score   Pre Score 19/24             Core Components/Risk Factors/Patient Goals at Admission:  Personal Goals and Risk Factors at Admission - 02/01/23 1058       Core Components/Risk Factors/Patient Goals on Admission   Lipids Yes    Intervention Provide education and support for participant on nutrition & aerobic/resistive exercise along with prescribed medications to achieve LDL 70mg , HDL >40mg .    Expected Outcomes Short Term: Participant states understanding of desired cholesterol values and is compliant with medications prescribed. Participant is following exercise prescription and nutrition guidelines.;Long Term: Cholesterol controlled with medications as prescribed, with individualized exercise RX and with personalized nutrition plan. Value goals: LDL < 70mg , HDL > 40 mg.    Stress Yes    Intervention Offer individual and/or small group education and counseling on adjustment to heart disease, stress management and health-related lifestyle change. Teach and support self-help strategies.;Refer participants experiencing significant psychosocial distress to appropriate mental health specialists for further  evaluation and treatment. When possible, include family members and significant others in education/counseling sessions.    Expected Outcomes Short Term: Participant demonstrates changes in health-related behavior, relaxation and other stress management skills, ability to obtain effective social support, and compliance with psychotropic medications if prescribed.;Long Term: Emotional wellbeing is indicated by absence of clinically significant psychosocial distress or social isolation.             Core Components/Risk Factors/Patient Goals Review:   Goals and Risk Factor Review     Row Name 02/22/23 1052 03/22/23 1059           Core Components/Risk Factors/Patient Goals Review   Personal Goals Review Weight Management/Obesity;Lipids;Stress Weight Management/Obesity;Lipids;Stress      Review Anylah is off to a good start to exercise at cardiac rehab. Vital signs have been stable. [name] is currently out with an URI [name] is doing well with exercise at cardiac rehab. Vital signs have been stable.      Expected Outcomes [name] will continue to participate in cardiac rehab for exercise, nutrition and lifestyle modifications [name] will continue to participate in cardiac rehab for exercise, nutrition and lifestyle modifications               Core Components/Risk Factors/Patient Goals at Discharge (Final Review):   Goals and Risk Factor Review - 03/22/23 1059       Core Components/Risk Factors/Patient Goals Review   Personal Goals Review Weight Management/Obesity;Lipids;Stress    Review [name] is doing well with exercise at cardiac rehab. Vital signs have been stable.    Expected  Outcomes [name] will continue to participate in cardiac rehab for exercise, nutrition and lifestyle modifications             ITP Comments:  ITP Comments     Row Name 02/01/23 1024 02/22/23 1030 03/22/23 1056       ITP Comments Medical Director- Dr. Armanda Magic, MD. Introduction to the Pritikin Education  Program / Intensive Cardiac Rehab. Initial orientation packet reviewed with the patient today. 30 Day ITP Review. Yeni is off to a good start to exercise at cardiac rehab. Onyinye is currently abesnt from cardiac rehab due to a recent URI. 30 Day ITP Review. Myria has good attendance and participaiton in  cardiac rehab              Comments: see ITP comments.Thayer Headings RN BSN

## 2023-03-23 ENCOUNTER — Encounter (HOSPITAL_COMMUNITY): Payer: Medicare Other

## 2023-03-23 ENCOUNTER — Other Ambulatory Visit: Payer: Self-pay | Admitting: Obstetrics & Gynecology

## 2023-03-23 DIAGNOSIS — M25532 Pain in left wrist: Secondary | ICD-10-CM | POA: Diagnosis not present

## 2023-03-23 DIAGNOSIS — M79645 Pain in left finger(s): Secondary | ICD-10-CM | POA: Diagnosis not present

## 2023-03-23 DIAGNOSIS — M1811 Unilateral primary osteoarthritis of first carpometacarpal joint, right hand: Secondary | ICD-10-CM | POA: Diagnosis not present

## 2023-03-23 DIAGNOSIS — Z1231 Encounter for screening mammogram for malignant neoplasm of breast: Secondary | ICD-10-CM

## 2023-03-25 ENCOUNTER — Encounter (HOSPITAL_COMMUNITY): Payer: Medicare Other

## 2023-03-25 NOTE — Progress Notes (Signed)
5.3 METS.  Experienced dyspnea.   ECHOCARDIOGRAM  ECHOCARDIOGRAM COMPLETE 05/12/2022  Narrative ECHOCARDIOGRAM  REPORT    Patient Name:   [name] Date of Exam: 05/12/2022 Medical Rec #:  [id number]           Height:       65.0 in Accession #:    [id number]          Weight:       157.0 lb Date of Birth:  Oct 31, 1955           BSA:          1.785 m Patient Age:    67 years            BP:           112/72 mmHg Patient Gender: F                   HR:           83 bpm. Exam Location:  [address]  Procedure: 2D Echo, 3D Echo, Cardiac Doppler, Color Doppler and Strain Analysis  Indications:    R06.00 Dyspnea  History:        Patient has prior history of Echocardiogram examinations, most recent 12/31/2021. CAD, Signs/Symptoms:Dyspnea; Risk Factors:Dyslipidemia. Non-Ischemic Cardiomyopathy (prior EF 50-55%) secondary to Chemotherapy. Right Breast Cancer with Lumpectomy, Chemotherapy and Radiation (1995), Left SVC.  Sonographer:    Farrel Conners RDCS Referring Phys: Noemi Chapel  IMPRESSIONS   1. Left ventricular ejection fraction, by estimation, is 50 to 55%. Left ventricular ejection fraction by 3D volume is 51 %. The left ventricle has low normal function. The left ventricle has no regional wall motion abnormalities. Left ventricular diastolic parameters were normal. The average left ventricular global longitudinal strain is -17.1 %. 2. Right ventricular systolic function is normal. The right ventricular size is normal. There is normal pulmonary artery systolic pressure. The estimated right ventricular systolic pressure is 24.7 mmHg. 3. The mitral valve is normal in structure. Trivial mitral valve regurgitation. No evidence of mitral stenosis. 4. The aortic valve was not well visualized. Aortic valve regurgitation is not visualized. No aortic stenosis is present. 5. The inferior vena cava is normal in size with greater than 50% respiratory variability, suggesting right atrial pressure of 3 mmHg.  FINDINGS Left Ventricle: Left ventricular ejection fraction, by estimation, is 50 to  55%. Left ventricular ejection fraction by 3D volume is 51 %. The left ventricle has low normal function. The left ventricle has no regional wall motion abnormalities. The average left ventricular global longitudinal strain is -17.1 %. The left ventricular internal cavity size was normal in size. There is no left ventricular hypertrophy. Left ventricular diastolic parameters were normal.  Right Ventricle: The right ventricular size is normal. No increase in right ventricular wall thickness. Right ventricular systolic function is normal. There is normal pulmonary artery systolic pressure. The tricuspid regurgitant velocity is 2.33 m/s, and with an assumed right atrial pressure of 3 mmHg, the estimated right ventricular systolic pressure is 24.7 mmHg.  Left Atrium: Left atrial size was normal in size.  Right Atrium: Right atrial size was normal in size.  Pericardium: There is no evidence of pericardial effusion.  Mitral Valve: The mitral valve is normal in structure. Trivial mitral valve regurgitation. No evidence of mitral valve stenosis.  Tricuspid Valve: The tricuspid valve is normal in structure. Tricuspid valve regurgitation is trivial.  Aortic Valve: The aortic valve was not well visualized. Aortic valve regurgitation  5.3 METS.  Experienced dyspnea.   ECHOCARDIOGRAM  ECHOCARDIOGRAM COMPLETE 05/12/2022  Narrative ECHOCARDIOGRAM  REPORT    Patient Name:   [name] Date of Exam: 05/12/2022 Medical Rec #:  [id number]           Height:       65.0 in Accession #:    [id number]          Weight:       157.0 lb Date of Birth:  Oct 31, 1955           BSA:          1.785 m Patient Age:    67 years            BP:           112/72 mmHg Patient Gender: F                   HR:           83 bpm. Exam Location:  [address]  Procedure: 2D Echo, 3D Echo, Cardiac Doppler, Color Doppler and Strain Analysis  Indications:    R06.00 Dyspnea  History:        Patient has prior history of Echocardiogram examinations, most recent 12/31/2021. CAD, Signs/Symptoms:Dyspnea; Risk Factors:Dyslipidemia. Non-Ischemic Cardiomyopathy (prior EF 50-55%) secondary to Chemotherapy. Right Breast Cancer with Lumpectomy, Chemotherapy and Radiation (1995), Left SVC.  Sonographer:    Farrel Conners RDCS Referring Phys: Noemi Chapel  IMPRESSIONS   1. Left ventricular ejection fraction, by estimation, is 50 to 55%. Left ventricular ejection fraction by 3D volume is 51 %. The left ventricle has low normal function. The left ventricle has no regional wall motion abnormalities. Left ventricular diastolic parameters were normal. The average left ventricular global longitudinal strain is -17.1 %. 2. Right ventricular systolic function is normal. The right ventricular size is normal. There is normal pulmonary artery systolic pressure. The estimated right ventricular systolic pressure is 24.7 mmHg. 3. The mitral valve is normal in structure. Trivial mitral valve regurgitation. No evidence of mitral stenosis. 4. The aortic valve was not well visualized. Aortic valve regurgitation is not visualized. No aortic stenosis is present. 5. The inferior vena cava is normal in size with greater than 50% respiratory variability, suggesting right atrial pressure of 3 mmHg.  FINDINGS Left Ventricle: Left ventricular ejection fraction, by estimation, is 50 to  55%. Left ventricular ejection fraction by 3D volume is 51 %. The left ventricle has low normal function. The left ventricle has no regional wall motion abnormalities. The average left ventricular global longitudinal strain is -17.1 %. The left ventricular internal cavity size was normal in size. There is no left ventricular hypertrophy. Left ventricular diastolic parameters were normal.  Right Ventricle: The right ventricular size is normal. No increase in right ventricular wall thickness. Right ventricular systolic function is normal. There is normal pulmonary artery systolic pressure. The tricuspid regurgitant velocity is 2.33 m/s, and with an assumed right atrial pressure of 3 mmHg, the estimated right ventricular systolic pressure is 24.7 mmHg.  Left Atrium: Left atrial size was normal in size.  Right Atrium: Right atrial size was normal in size.  Pericardium: There is no evidence of pericardial effusion.  Mitral Valve: The mitral valve is normal in structure. Trivial mitral valve regurgitation. No evidence of mitral valve stenosis.  Tricuspid Valve: The tricuspid valve is normal in structure. Tricuspid valve regurgitation is trivial.  Aortic Valve: The aortic valve was not well visualized. Aortic valve regurgitation  Cardiology Office Note:  .   Date:  04/04/2023  ID:  [name], DOB 23-Oct-1955, MRN [id number] PCP: Bradd Canary, MD  [facility] Providers Cardiologist:  Rollene Rotunda, MD }   History of Present Illness: .   [name] is a 67 y.o. female with a past medical history of nonobstructive CAD, nonischemic cardiomyopathy, anomalous SVC, GERD, history of breast cancer. Patient is followed by Dr. Antoine Poche and presents today for a follow up of nonischemic cardiomyopathy. Cardiomyopathy is thought to be related to chemotherapy for past breast cancer.   Patient previously underwent echocardiogram in 11/2010 that showed EF 45-50%, no regional wall motion abnormalities, grade I DD. Underwent cardiac catheterization on 01/11/11 that showed 50% LAD stenosis. 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 This was confirmed by echo, did not appear to have an ASD associated with this.   Later underwent echocardiogram 04/2015 that showed EF 50-55%, no regional wall motion abnormalities, grade I DD. Underwent RHC on 05/08/15 that showed normal right heart pressures, normal oximetry sampling, normal cardiac output.   Most recent echocardiogram from 05/12/22 showed EF 50-55%, no regional wall motion abnormalities, normal RV function, normal pulmonary artery systolic pressure. Exercise tolerance test from 08/25/22 showed no ST deviation, low risk exercise treadmill test without evidence of ischemia.   Patient was last seen by Dr. Antoine Poche on 08/23/22. At that time, patient Complained of some shortness of breath. Denied edema, PND, orthopnea. She was started on a trial for 40 mg daily   Appears that more recently, patient has been followed by [facility]. Underwent echocardiogram on 10/20/22 that showed normal LV systolic function, normal LA pressures, normal diastolic function, normal RV function. Underwent cardiac catheterization on 11/10/22 that  showed a small dominant RCA without angiographic disease, normal appearing circumflex, 80% proximal LAD narrowing  that was hemodynamically significant. Underwent successful IVUS guided PCI of the LAD with DES.   Today, patient reports that since her PCI, she has been doing very well from a cardiac perspective. She has been participating in cardiac rehab, and feels like she is getting a lot of benefit from the classes. She participated in the heart and vascular walk this past weekend and was able to walk 4 miles, some of which were up hill. Denies chest pain, shortness of breath. She denies palpitations, dizziness. Denies ankle edema. She is not on any cholesterol medications, but has started taking red yeast rice supplements. She wants to try to manage her cholesterol with diet and exercise prior to starting cholesterol medications as she did not tolerate statins in the past. She follows with [facility], but would like to continue to follow with our practice as well. Her cardiologist at [facility] wants her to remain in contact with her local cardiologist as she lives in [place].   ROS: Denies chest pain, palpitations, shortness of breath, dizziness, ankle edema, syncope, near syncope   Studies Reviewed: .   Cardiac Studies & Procedures   CARDIAC CATHETERIZATION  CARDIAC CATHETERIZATION 05/08/2015  Narrative  Normal right heart pressures  Normal oximetry sampling  Normal cardiac output  Coronary calcification, mild    Recommendations:  Exercise treadmill test/myocardial perfusion imaging if not already performed.   STRESS TESTS  EXERCISE TOLERANCE TEST (ETT) 08/25/2022  Narrative   No ST deviation was noted.   Low risk exercise treadmill test with no electrocardiographic evidence of ischemia.  Reduced exercise effort, 4 minutes and 8 seconds achieving  Cardiology Office Note:  .   Date:  04/04/2023  ID:  [name], DOB 23-Oct-1955, MRN [id number] PCP: Bradd Canary, MD  [facility] Providers Cardiologist:  Rollene Rotunda, MD }   History of Present Illness: .   [name] is a 67 y.o. female with a past medical history of nonobstructive CAD, nonischemic cardiomyopathy, anomalous SVC, GERD, history of breast cancer. Patient is followed by Dr. Antoine Poche and presents today for a follow up of nonischemic cardiomyopathy. Cardiomyopathy is thought to be related to chemotherapy for past breast cancer.   Patient previously underwent echocardiogram in 11/2010 that showed EF 45-50%, no regional wall motion abnormalities, grade I DD. Underwent cardiac catheterization on 01/11/11 that showed 50% LAD stenosis. 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 This was confirmed by echo, did not appear to have an ASD associated with this.   Later underwent echocardiogram 04/2015 that showed EF 50-55%, no regional wall motion abnormalities, grade I DD. Underwent RHC on 05/08/15 that showed normal right heart pressures, normal oximetry sampling, normal cardiac output.   Most recent echocardiogram from 05/12/22 showed EF 50-55%, no regional wall motion abnormalities, normal RV function, normal pulmonary artery systolic pressure. Exercise tolerance test from 08/25/22 showed no ST deviation, low risk exercise treadmill test without evidence of ischemia.   Patient was last seen by Dr. Antoine Poche on 08/23/22. At that time, patient Complained of some shortness of breath. Denied edema, PND, orthopnea. She was started on a trial for 40 mg daily   Appears that more recently, patient has been followed by [facility]. Underwent echocardiogram on 10/20/22 that showed normal LV systolic function, normal LA pressures, normal diastolic function, normal RV function. Underwent cardiac catheterization on 11/10/22 that  showed a small dominant RCA without angiographic disease, normal appearing circumflex, 80% proximal LAD narrowing  that was hemodynamically significant. Underwent successful IVUS guided PCI of the LAD with DES.   Today, patient reports that since her PCI, she has been doing very well from a cardiac perspective. She has been participating in cardiac rehab, and feels like she is getting a lot of benefit from the classes. She participated in the heart and vascular walk this past weekend and was able to walk 4 miles, some of which were up hill. Denies chest pain, shortness of breath. She denies palpitations, dizziness. Denies ankle edema. She is not on any cholesterol medications, but has started taking red yeast rice supplements. She wants to try to manage her cholesterol with diet and exercise prior to starting cholesterol medications as she did not tolerate statins in the past. She follows with [facility], but would like to continue to follow with our practice as well. Her cardiologist at [facility] wants her to remain in contact with her local cardiologist as she lives in [place].   ROS: Denies chest pain, palpitations, shortness of breath, dizziness, ankle edema, syncope, near syncope   Studies Reviewed: .   Cardiac Studies & Procedures   CARDIAC CATHETERIZATION  CARDIAC CATHETERIZATION 05/08/2015  Narrative  Normal right heart pressures  Normal oximetry sampling  Normal cardiac output  Coronary calcification, mild    Recommendations:  Exercise treadmill test/myocardial perfusion imaging if not already performed.   STRESS TESTS  EXERCISE TOLERANCE TEST (ETT) 08/25/2022  Narrative   No ST deviation was noted.   Low risk exercise treadmill test with no electrocardiographic evidence of ischemia.  Reduced exercise effort, 4 minutes and 8 seconds achieving

## 2023-03-28 ENCOUNTER — Encounter (HOSPITAL_COMMUNITY)
Admission: RE | Admit: 2023-03-28 | Discharge: 2023-03-28 | Disposition: A | Discharge status: Home / Self Care | Payer: Medicare Other | Source: Ambulatory Visit | Attending: Cardiology | Admitting: Cardiology

## 2023-03-28 DIAGNOSIS — Z955 Presence of coronary angioplasty implant and graft: Secondary | ICD-10-CM | POA: Diagnosis not present

## 2023-03-28 DIAGNOSIS — Z48812 Encounter for surgical aftercare following surgery on the circulatory system: Secondary | ICD-10-CM | POA: Diagnosis not present

## 2023-03-30 ENCOUNTER — Encounter (HOSPITAL_COMMUNITY)
Admission: RE | Admit: 2023-03-30 | Discharge: 2023-03-30 | Disposition: A | Discharge status: Home / Self Care | Payer: Medicare Other | Source: Ambulatory Visit | Attending: Cardiology | Admitting: Cardiology

## 2023-03-30 DIAGNOSIS — Z955 Presence of coronary angioplasty implant and graft: Secondary | ICD-10-CM | POA: Insufficient documentation

## 2023-03-30 DIAGNOSIS — C4359 Malignant melanoma of other part of trunk: Secondary | ICD-10-CM | POA: Diagnosis not present

## 2023-04-01 ENCOUNTER — Encounter (HOSPITAL_COMMUNITY)
Admission: RE | Admit: 2023-04-01 | Discharge: 2023-04-01 | Disposition: A | Discharge status: Home / Self Care | Payer: Medicare Other | Source: Ambulatory Visit | Attending: Cardiology

## 2023-04-01 DIAGNOSIS — Z955 Presence of coronary angioplasty implant and graft: Secondary | ICD-10-CM

## 2023-04-04 ENCOUNTER — Encounter (HOSPITAL_COMMUNITY)
Admission: RE | Admit: 2023-04-04 | Discharge: 2023-04-04 | Disposition: A | Discharge status: Home / Self Care | Payer: Medicare Other | Source: Ambulatory Visit | Attending: Cardiology

## 2023-04-04 ENCOUNTER — Ambulatory Visit: Payer: Medicare Other | Attending: Cardiology | Admitting: Cardiology

## 2023-04-04 ENCOUNTER — Encounter: Payer: Self-pay | Admitting: Cardiology

## 2023-04-04 VITALS — BP 130/72 | HR 93 | Ht 65.5 in | Wt 169.0 lb

## 2023-04-04 DIAGNOSIS — E785 Hyperlipidemia, unspecified: Secondary | ICD-10-CM | POA: Diagnosis not present

## 2023-04-04 DIAGNOSIS — I2510 Atherosclerotic heart disease of native coronary artery without angina pectoris: Secondary | ICD-10-CM | POA: Diagnosis not present

## 2023-04-04 DIAGNOSIS — Q249 Congenital malformation of heart, unspecified: Secondary | ICD-10-CM | POA: Diagnosis not present

## 2023-04-04 DIAGNOSIS — I428 Other cardiomyopathies: Secondary | ICD-10-CM | POA: Diagnosis not present

## 2023-04-04 DIAGNOSIS — Z955 Presence of coronary angioplasty implant and graft: Secondary | ICD-10-CM | POA: Diagnosis not present

## 2023-04-04 NOTE — Patient Instructions (Signed)
Medication Instructions:  No Changes *If you need a refill on your cardiac medications before your next appointment, please call your pharmacy*   Lab Work: No Labs   Testing/Procedures: No Testing   Follow-Up: At [facility], you and your health needs are our priority.  As part of our continuing mission to provide you with exceptional heart care, we have created designated Provider Care Teams.  These Care Teams include your primary Cardiologist (physician) and Advanced Practice Providers (APPs -  Physician Assistants and Nurse Practitioners) who all work together to provide you with the care you need, when you need it.  We recommend signing up for the patient portal called "MyChart".  Sign up information is provided on this After Visit Summary.  MyChart is used to connect with patients for Virtual Visits (Telemedicine).  Patients are able to view lab/test results, encounter notes, upcoming appointments, etc.  Non-urgent messages can be sent to your provider as well.   To learn more about what you can do with MyChart, go to [email or web address].    Your next appointment:   6 month(s)  Provider:   Rollene Rotunda, MD

## 2023-04-06 ENCOUNTER — Encounter (HOSPITAL_COMMUNITY)
Admission: RE | Admit: 2023-04-06 | Discharge: 2023-04-06 | Disposition: A | Discharge status: Home / Self Care | Payer: Medicare Other | Source: Ambulatory Visit | Attending: Cardiology

## 2023-04-06 DIAGNOSIS — Z955 Presence of coronary angioplasty implant and graft: Secondary | ICD-10-CM

## 2023-04-08 ENCOUNTER — Encounter (HOSPITAL_COMMUNITY)
Admission: RE | Admit: 2023-04-08 | Discharge: 2023-04-08 | Disposition: A | Discharge status: Home / Self Care | Payer: Medicare Other | Source: Ambulatory Visit | Attending: Cardiology | Admitting: Cardiology

## 2023-04-08 DIAGNOSIS — Z955 Presence of coronary angioplasty implant and graft: Secondary | ICD-10-CM | POA: Diagnosis not present

## 2023-04-11 ENCOUNTER — Encounter (HOSPITAL_COMMUNITY)
Admission: RE | Admit: 2023-04-11 | Discharge: 2023-04-11 | Disposition: A | Discharge status: Home / Self Care | Payer: Medicare Other | Source: Ambulatory Visit | Attending: Cardiology | Admitting: Cardiology

## 2023-04-11 DIAGNOSIS — Z955 Presence of coronary angioplasty implant and graft: Secondary | ICD-10-CM

## 2023-04-13 ENCOUNTER — Encounter (HOSPITAL_COMMUNITY)
Admission: RE | Admit: 2023-04-13 | Discharge: 2023-04-13 | Disposition: A | Discharge status: Home / Self Care | Payer: Medicare Other | Source: Ambulatory Visit | Attending: Cardiology

## 2023-04-13 DIAGNOSIS — Z955 Presence of coronary angioplasty implant and graft: Secondary | ICD-10-CM | POA: Diagnosis not present

## 2023-04-14 NOTE — Progress Notes (Signed)
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 11.6 oz (78.8 kg)     Comment total cholesterol 214, LDL 144 (lipitor is listed as an allergy), A1c 5.9 no new labs; most recent labs total cholesterol 214, LDL 144 (lipitor is listed as an allergy), A1c 5.9 no new labs; most recent labs total cholesterol 214, LDL 144 (lipitor and crestor are listed as an allergy), A1c 5.9     Expected Outcome Anaija has medical history of cancer; she reports that during this treatment she received significant nutrition education. She reports prioritizing high fiber foods and lean proteins. She reports no nutrition questions/concerns at this time. Patient will benefit from participation in intensive cardiac rehab for nutrition, exercise, and lifestyle modification. Goals in progress. Lajune continues to  attend the Pritikin education and nutrition series reguarly. She has improved understanding of benefits of increased fiber intake, saturated fat recommendations, and reading food labels for sodium/sugar. She reports prioritizing high fiber foods and lean proteins in her diet. She reports no nutrition questions/concerns at this time. She is down 2.2# since starting with  our program. Patient will benefit from participation in intensive cardiac rehab for nutrition, exercise, and lifestyle modification. Goals in action. Lonetta continues to attend the [employer]in education and nutrition series reguarly. She has improved understanding of benefits of increased fiber intake, saturated fat recommendations, and reading food labels for sodium/sugar. She reports prioritizing high fiber foods and lean proteins in her diet. She is unable to take statin medications; did discuss Repatha and Leqvio at cardiology follow-up on 04/04/23 but she declined at this time. She will have updated labs at PCP appointment at the end of this month. She is up 3.7# since starting with our program. Patient will benefit from participation in intensive cardiac rehab for nutrition, exercise, and lifestyle modification.              Nutrition Goals Discharge (Final Nutrition Goals Re-Evaluation):  Nutrition Goals Re-Evaluation - 04/08/23 1151       Goals   Current Weight 173 lb 11.6 oz (78.8 kg)    Comment no new labs; most recent labs total cholesterol 214, LDL 144 (lipitor and crestor are listed as an allergy), A1c 5.9    Expected Outcome Goals in action. Amaja continues to attend the [employer] education and nutrition series reguarly. She has improved understanding of benefits of increased fiber intake, saturated fat recommendations, and reading food labels for sodium/sugar. She reports prioritizing high fiber foods and lean proteins in her diet. She is unable to take statin medications; did discuss Repatha and Leqvio at cardiology follow-up  on 04/04/23 but she declined at this time. She will have updated labs at PCP appointment at the end of this month. She is up 3.7# since starting with our program. Patient will benefit from participation in intensive cardiac rehab for nutrition, exercise, and lifestyle modification.             Psychosocial: Target Goals: Acknowledge presence or absence of significant depression and/or stress, maximize coping skills, provide positive support system. Participant is able to verbalize types and ability to use techniques and skills needed for reducing stress and depression.  Initial Review & Psychosocial Screening:  Initial Psych Review & Screening - 02/01/23 1108       Initial Review   Current issues with History of Depression;Current Stress Concerns    Source of Stress Concerns Family    Comments Naylea takes care of her husband with diabetes.      Family Dynamics   Good Support System? 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 She talks to her preacher and a friend for support when needed.      Barriers   Psychosocial barriers to participate in program The patient should benefit from training in stress management and relaxation.      Screening Interventions   Interventions Encouraged to exercise;Provide feedback about the scores to participant    Expected Outcomes Short Term goal: Utilizing psychosocial counselor, staff and physician to assist with identification of specific Stressors or current issues interfering with healing process. Setting desired goal for each stressor or current issue identified.;Long Term Goal: Stressors or current issues are controlled or eliminated.;Short Term goal: Identification and review with participant of any Quality of Life or Depression concerns found by scoring the questionnaire.;Long Term goal: The participant improves quality of Life and PHQ9 Scores as seen by post scores and/or verbalization of changes             Quality of Life  Scores:  Quality of Life - 02/01/23 1536  Quality of Life   Select Quality of Life      Quality of Life Scores   Health/Function Pre 21.3 %    Socioeconomic Pre 24.2 %    Psych/Spiritual Pre 22.93 %    Family Pre 20.1 %    GLOBAL Pre 21.84 %            Scores of 19 and below usually indicate a poorer quality of life in these areas.  A difference of  2-3 points is a clinically meaningful difference.  A difference of 2-3 points in the total score of the Quality of Life Index has been associated with significant improvement in overall quality of life, self-image, physical symptoms, and general health in studies assessing change in quality of life.  PHQ-9: Review Flowsheet  More data exists      02/01/2023 01/10/2023 10/28/2022 07/26/2022 07/21/2022  Depression screen PHQ 2/9  Decreased Interest 0 0 0 0 0  Down, Depressed, Hopeless 0 0 0 1 0  PHQ - 2 Score 0 0 0 1 0  Altered sleeping 1 - 0 0 -  Tired, decreased energy 1 - 0 0 -  Change in appetite 0 - 0 0 -  Feeling bad or failure about yourself  0 - 0 0 -  Trouble concentrating 0 - 0 0 -  Moving slowly or fidgety/restless 0 - 0 0 -  Suicidal thoughts 0 - 0 0 -  PHQ-9 Score 2 - 0 1 -  Difficult doing work/chores Somewhat difficult - Not difficult at all Not difficult at all -    Details           Interpretation of Total Score  Total Score Depression Severity:  1-4 = Minimal depression, 5-9 = Mild depression, 10-14 = Moderate depression, 15-19 = Moderately severe depression, 20-27 = Severe depression   Psychosocial Evaluation and Intervention:   Psychosocial Re-Evaluation:  Psychosocial Re-Evaluation     Row Name 02/22/23 1040 03/22/23 1058 04/14/23 1438         Psychosocial Re-Evaluation   Current issues with History of Depression;Current Stress Concerns History of Depression;Current Stress Concerns History of Depression;Current Stress Concerns     Comments Armilla has not voiced any increased concerns or  stressors during exercise at cardiac rehab. Raylea is currently out witha URI. Laikyn has not voiced any increased concerns or stressors during exercise at cardiac rehab. Soledad has not voiced any increased concerns or stressors during exercise at cardiac rehab. Shawni will complete cardiac rehab ain November.     Expected Outcomes Carmell will have decreased or controlled stress upon completion of cardiac rehab Betania will have decreased or controlled stress upon completion of cardiac rehab Kaleen will have decreased or controlled stress upon completion of cardiac rehab     Interventions Stress management education;Encouraged to attend Cardiac Rehabilitation for the exercise;Relaxation education Stress management education;Encouraged to attend Cardiac Rehabilitation for the exercise;Relaxation education Stress management education;Encouraged to attend Cardiac Rehabilitation for the exercise;Relaxation education     Continue Psychosocial Services  No Follow up required No Follow up required No Follow up required       Initial Review   Source of Stress Concerns Family Family Family     Comments Will continue to monitor and offer support as needed Will continue to monitor and offer support as needed Will continue to monitor and offer support as needed              Psychosocial Discharge (Final Psychosocial Re-Evaluation):  Psychosocial  Re-Evaluation - 04/14/23 1438       Psychosocial Re-Evaluation   Current issues with History of Depression;Current Stress Concerns    Comments Maribeth has not voiced any increased concerns or stressors during exercise at cardiac rehab. Walterine will complete cardiac rehab ain November.    Expected Outcomes Charese will have decreased or controlled stress upon completion of cardiac rehab    Interventions Stress management education;Encouraged to attend Cardiac Rehabilitation for the exercise;Relaxation education    Continue Psychosocial Services  No Follow up required       Initial Review   Source of Stress Concerns Family    Comments Will continue to monitor and offer support as needed             Vocational Rehabilitation: Provide vocational rehab assistance to qualifying candidates.   Vocational Rehab Evaluation & Intervention:  Vocational Rehab - 02/01/23 1059       Initial Vocational Rehab Evaluation & Intervention   Assessment shows need for Vocational Rehabilitation No   Retired            Education: Education Goals: Education classes will be provided on a weekly basis, covering required topics. Participant will state understanding/return demonstration of topics presented.    Education     Row Name 02/09/23 1000     Education   Cardiac Education Topics Pritikin   Secondary school teacher School   Educator Dietitian   Weekly Topic Powerhouse Plant-Based Proteins   Instruction Review Code 1- Verbalizes Understanding   Class Start Time 0815   Class Stop Time 0856   Class Time Calculation (min) 41 min    Row Name 02/11/23 0900     Education   Cardiac Education Topics Pritikin   Select Core Videos     Core Videos   Educator Exercise Physiologist   Select General Education   General Education Hypertension and Heart Disease   Instruction Review Code 1- Verbalizes Understanding   Class Start Time 0810   Class Stop Time 0850   Class Time Calculation (min) 40 min    Row Name 02/14/23 0900     Education   Cardiac Education Topics Pritikin   Geographical information systems officer Psychosocial   Psychosocial Workshop From Head to Heart: The Power of a Healthy Outlook   Instruction Review Code 1- Verbalizes Understanding   Class Start Time 0815   Class Stop Time 0904   Class Time Calculation (min) 49 min    Row Name 02/16/23 1000     Education   Cardiac Education Topics Pritikin   Secondary school teacher School   Educator Dietitian   Weekly Topic Adding  Flavor - Sodium-Free   Instruction Review Code 1- Verbalizes Understanding   Class Start Time [phone]   Class Stop Time 0845   Class Time Calculation (min) 31 min    Row Name 03/02/23 1000     Education   Cardiac Education Topics Pritikin   Secondary school teacher School   Educator Dietitian   Weekly Topic Personalizing Your Pritikin Plate   Instruction Review Code 1- Verbalizes Understanding   Class Start Time 0815   Class Stop Time 0848   Class Time Calculation (min) 33 min    Row Name 03/04/23 0900     Education   Cardiac Education Topics Pritikin   [employer]  Workshops   Biomedical scientist Exercise   Exercise Workshop Location manager and Fall Prevention   Instruction Review Code 1- Verbalizes Understanding   Class Start Time [phone]   Class Stop Time 0856   Class Time Calculation (min) 45 min    Row Name 03/07/23 0900     Education   Cardiac Education Topics Pritikin   Glass blower/designer Nutrition   Nutrition Workshop Label Reading   Instruction Review Code 1- Verbalizes Understanding   Class Start Time 0815   Class Stop Time 0904   Class Time Calculation (min) 49 min    Row Name 03/11/23 1000     Education   Cardiac Education Topics Pritikin   Nurse, children's   Educator Dietitian   Select Nutrition   Nutrition Other  Label Reading   Instruction Review Code 1- Verbalizes Understanding   Class Start Time 0815   Class Stop Time 0900   Class Time Calculation (min) 45 min    Row Name 03/14/23 1100     Education   Cardiac Education Topics Pritikin   Select Workshops     Workshops   Educator Exercise Physiologist   Select Psychosocial   Psychosocial Workshop Recognizing and Reducing Stress   Instruction Review Code 1- Verbalizes Understanding   Class Start Time 0815   Class Stop Time 0900   Class Time Calculation (min) 45 min    Row Name  03/16/23 1000     Education   Cardiac Education Topics Pritikin   Orthoptist   Educator Dietitian   Weekly Topic Tasty Appetizers and Snacks   Instruction Review Code 1- Verbalizes Understanding   Class Start Time 0815   Class Stop Time 0900   Class Time Calculation (min) 45 min    Row Name 03/18/23 1300     Education   Cardiac Education Topics Pritikin   Nurse, children's Exercise Physiologist   Select Nutrition   Nutrition Calorie Density   Instruction Review Code 1- Verbalizes Understanding   Class Start Time [phone]   Class Stop Time 0900   Class Time Calculation (min) 44 min    Row Name 03/21/23 0900     Education   Cardiac Education Topics Pritikin   Immunologist Exercise Physiologist   Select Exercise   Exercise Workshop Exercise Basics: Building Your Action Plan   Instruction Review Code 1- Verbalizes Understanding   Class Start Time 0813   Class Stop Time 0900   Class Time Calculation (min) 47 min    Row Name 03/28/23 1000     Education   Cardiac Education Topics Pritikin   Glass blower/designer Nutrition   Nutrition Workshop Targeting Your Nutrition Priorities   Instruction Review Code 1- Verbalizes Understanding   Class Start Time 0815   Class Stop Time 0900   Class Time Calculation (min) 45 min    Row Name 03/30/23 0900     Education   Cardiac Education Topics Pritikin   Set designer Dietitian   Weekly Topic One-Pot Wonders   Instruction Review Code 1- Verbalizes Understanding   Class Start Time [phone]  Class Stop Time 0855   Class Time Calculation (min) 40 min    Row Name 04/01/23 0900     Education   Cardiac Education Topics Pritikin   Select Core Videos     Core Videos   Educator Exercise Physiologist   Select General Education   General Education Hypertension  and Heart Disease   Instruction Review Code 1- Verbalizes Understanding   Class Start Time 0818   Class Stop Time 0850   Class Time Calculation (min) 32 min    Row Name 04/04/23 1000     Education   Cardiac Education Topics Pritikin   Select Core Videos     Core Videos   Educator Dietitian   Select Nutrition   Nutrition Dining Out - Part 1   Instruction Review Code 1- Verbalizes Understanding   Class Start Time 0815   Class Stop Time 0855   Class Time Calculation (min) 40 min    Row Name 04/06/23 1100     Education   Cardiac Education Topics Pritikin   Customer service manager   Weekly Topic Comforting Weekend Breakfasts   Instruction Review Code 1- Verbalizes Understanding   Class Start Time 0815   Class Stop Time 0900   Class Time Calculation (min) 45 min    Row Name 04/08/23 1100     Education   Cardiac Education Topics Pritikin   Select Workshops     Workshops   Educator Exercise Physiologist   Select Psychosocial   Psychosocial Workshop Focused Goals, Sustainable Changes   Instruction Review Code 1- Verbalizes Understanding   Class Start Time [phone]   Class Stop Time 0848   Class Time Calculation (min) 37 min    Row Name 04/11/23 1200     Education   Cardiac Education Topics Pritikin   Select Core Videos     Core Videos   Educator Exercise Physiologist   Select Exercise Education   Exercise Education Biomechanial Limitations   Instruction Review Code 1- Verbalizes Understanding   Class Start Time 0815   Class Stop Time 0855   Class Time Calculation (min) 40 min    Row Name 04/13/23 0800     Education   Cardiac Education Topics Pritikin   Select Core Videos     Core Videos   Educator Exercise Physiologist   Select Nutrition   Nutrition Vitamins and Minerals   Instruction Review Code 1- Verbalizes Understanding   Class Start Time [phone]   Class Stop Time 0902   Class Time Calculation (min) 48 min             Core Videos: Exercise    Move It!  Clinical staff conducted group or individual video education with verbal and written material and guidebook.  Patient learns the recommended Pritikin exercise program. Exercise with the goal of living a long, healthy life. Some of the health benefits of exercise include controlled diabetes, healthier blood pressure levels, improved cholesterol levels, improved heart and lung capacity, improved sleep, and better body composition. Everyone should speak with their doctor before starting or changing an exercise routine.  Biomechanical Limitations Clinical staff conducted group or individual video education with verbal and written material and guidebook.  Patient learns how biomechanical limitations can impact exercise and how we can mitigate and possibly overcome limitations to have an impactful and balanced exercise routine.  Body Composition Clinical staff conducted group or individual video education with verbal and written  material and guidebook.  Patient learns that body composition (ratio of muscle mass to fat mass) is a key component to assessing overall fitness, rather than body weight alone. Increased fat mass, especially visceral belly fat, can put [place] at increased risk for metabolic syndrome, type 2 diabetes, heart disease, and even death. It is recommended to combine diet and exercise (cardiovascular and resistance training) to improve your body composition. Seek guidance from your physician and exercise physiologist before implementing an exercise routine.  Exercise Action Plan Clinical staff conducted group or individual video education with verbal and written material and guidebook.  Patient learns the recommended strategies to achieve and enjoy long-term exercise adherence, including variety, self-motivation, self-efficacy, and positive decision making. Benefits of exercise include fitness, good health, weight management, more energy, better  sleep, less stress, and overall well-being.  Medical   Heart Disease Risk Reduction Clinical staff conducted group or individual video education with verbal and written material and guidebook.  Patient learns our heart is our most vital organ as it circulates oxygen, nutrients, white blood cells, and hormones throughout the entire body, and carries waste away. Data supports a plant-based eating plan like the Pritikin Program for its effectiveness in slowing progression of and reversing heart disease. The video provides a number of recommendations to address heart disease.   Metabolic Syndrome and Belly Fat  Clinical staff conducted group or individual video education with verbal and written material and guidebook.  Patient learns what metabolic syndrome is, how it leads to heart disease, and how one can reverse it and keep it from coming back. You have metabolic syndrome if you have 3 of the following 5 criteria: abdominal obesity, high blood pressure, high triglycerides, low HDL cholesterol, and high blood sugar.  Hypertension and Heart Disease Clinical staff conducted group or individual video education with verbal and written material and guidebook.  Patient learns that high blood pressure, or hypertension, is very common in the [place]. Hypertension is largely due to excessive salt intake, but other important risk factors include being overweight, physical inactivity, drinking too much alcohol, smoking, and not eating enough potassium from fruits and vegetables. High blood pressure is a leading risk factor for heart attack, stroke, congestive heart failure, dementia, kidney failure, and premature death. Long-term effects of excessive salt intake include stiffening of the arteries and thickening of heart muscle and organ damage. Recommendations include ways to reduce hypertension and the risk of heart disease.  Diseases of Our Time - Focusing on Diabetes Clinical staff conducted group or  individual video education with verbal and written material and guidebook.  Patient learns why the best way to stop diseases of our time is prevention, through food and other lifestyle changes. Medicine (such as prescription pills and surgeries) is often only a Band-Aid on the problem, not a long-term solution. Most common diseases of our time include obesity, type 2 diabetes, hypertension, heart disease, and cancer. The Pritikin Program is recommended and has been proven to help reduce, reverse, and/or prevent the damaging effects of metabolic syndrome.  Nutrition   Overview of the Pritikin Eating Plan  Clinical staff conducted group or individual video education with verbal and written material and guidebook.  Patient learns about the Pritikin Eating Plan for disease risk reduction. The Pritikin Eating Plan emphasizes a wide variety of unrefined, minimally-processed carbohydrates, like fruits, vegetables, whole grains, and legumes. Go, Caution, and Stop food choices are explained. Plant-based and lean animal proteins are emphasized. Rationale provided for low sodium intake  for blood pressure control, low added sugars for blood sugar stabilization, and low added fats and oils for coronary artery disease risk reduction and weight management.  Calorie Density  Clinical staff conducted group or individual video education with verbal and written material and guidebook.  Patient learns about calorie density and how it impacts the Pritikin Eating Plan. Knowing the characteristics of the food you choose will help you decide whether those foods will lead to weight gain or weight loss, and whether you want to consume more or less of them. Weight loss is usually a side effect of the Pritikin Eating Plan because of its focus on low calorie-dense foods.  Label Reading  Clinical staff conducted group or individual video education with verbal and written material and guidebook.  Patient learns about the Pritikin  recommended label reading guidelines and corresponding recommendations regarding calorie density, added sugars, sodium content, and whole grains.  Dining Out - Part 1  Clinical staff conducted group or individual video education with verbal and written material and guidebook.  Patient learns that restaurant meals can be sabotaging because they can be so high in calories, fat, sodium, and/or sugar. Patient learns recommended strategies on how to positively address this and avoid unhealthy pitfalls.  Facts on Fats  Clinical staff conducted group or individual video education with verbal and written material and guidebook.  Patient learns that lifestyle modifications can be just as effective, if not more so, as many medications for lowering your risk of heart disease. A Pritikin lifestyle can help to reduce your risk of inflammation and atherosclerosis (cholesterol build-up, or plaque, in the artery walls). Lifestyle interventions such as dietary choices and physical activity address the cause of atherosclerosis. A review of the types of fats and their impact on blood cholesterol levels, along with dietary recommendations to reduce fat intake is also included.  Nutrition Action Plan  Clinical staff conducted group or individual video education with verbal and written material and guidebook.  Patient learns how to incorporate Pritikin recommendations into their lifestyle. Recommendations include planning and keeping personal health goals in mind as an important part of their success.  Healthy Mind-Set    Healthy Minds, Bodies, Hearts  Clinical staff conducted group or individual video education with verbal and written material and guidebook.  Patient learns how to identify when they are stressed. Video will discuss the impact of that stress, as well as the many benefits of stress management. Patient will also be introduced to stress management techniques. The way we think, act, and feel has an impact on  our hearts.  How Our Thoughts Can Heal Our Hearts  Clinical staff conducted group or individual video education with verbal and written material and guidebook.  Patient learns that negative thoughts can cause depression and anxiety. This can result in negative lifestyle behavior and serious health problems. Cognitive behavioral therapy is an effective method to help control our thoughts in order to change and improve our emotional outlook.  Additional Videos:  Exercise    Improving Performance  Clinical staff conducted group or individual video education with verbal and written material and guidebook.  Patient learns to use a non-linear approach by alternating intensity levels and lengths of time spent exercising to help burn more calories and lose more body fat. Cardiovascular exercise helps improve heart health, metabolism, hormonal balance, blood sugar control, and recovery from fatigue. Resistance training improves strength, endurance, balance, coordination, reaction time, metabolism, and muscle mass. Flexibility exercise improves circulation, posture, and balance. Seek  guidance from your physician and exercise physiologist before implementing an exercise routine and learn your capabilities and proper form for all exercise.  Introduction to Yoga  Clinical staff conducted group or individual video education with verbal and written material and guidebook.  Patient learns about yoga, a discipline of the coming together of mind, breath, and body. The benefits of yoga include improved flexibility, improved range of motion, better posture and core strength, increased lung function, weight loss, and positive self-image. Yoga's heart health benefits include lowered blood pressure, healthier heart rate, decreased cholesterol and triglyceride levels, improved immune function, and reduced stress. Seek guidance from your physician and exercise physiologist before implementing an exercise routine and learn  your capabilities and proper form for all exercise.  Medical   Aging: Enhancing Your Quality of Life  Clinical staff conducted group or individual video education with verbal and written material and guidebook.  Patient learns key strategies and recommendations to stay in good physical health and enhance quality of life, such as prevention strategies, having an advocate, securing a Health Care Proxy and Power of Attorney, and keeping a list of medications and system for tracking them. It also discusses how to avoid risk for bone loss.  Biology of Weight Control  Clinical staff conducted group or individual video education with verbal and written material and guidebook.  Patient learns that weight gain occurs because we consume more calories than we burn (eating more, moving less). Even if your body weight is normal, you may have higher ratios of fat compared to muscle mass. Too much body fat puts you at increased risk for cardiovascular disease, heart attack, stroke, type 2 diabetes, and obesity-related cancers. In addition to exercise, following the Pritikin Eating Plan can help reduce your risk.  Decoding Lab Results  Clinical staff conducted group or individual video education with verbal and written material and guidebook.  Patient learns that lab test reflects one measurement whose values change over time and are influenced by many factors, including medication, stress, sleep, exercise, food, hydration, pre-existing medical conditions, and more. It is recommended to use the knowledge from this video to become more involved with your lab results and evaluate your numbers to speak with your doctor.   Diseases of Our Time - Overview  Clinical staff conducted group or individual video education with verbal and written material and guidebook.  Patient learns that according to the CDC, 50% to 70% of chronic diseases (such as obesity, type 2 diabetes, elevated lipids, hypertension, and heart disease)  are avoidable through lifestyle improvements including healthier food choices, listening to satiety cues, and increased physical activity.  Sleep Disorders Clinical staff conducted group or individual video education with verbal and written material and guidebook.  Patient learns how good quality and duration of sleep are important to overall health and well-being. Patient also learns about sleep disorders and how they impact health along with recommendations to address them, including discussing with a physician.  Nutrition  Dining Out - Part 2 Clinical staff conducted group or individual video education with verbal and written material and guidebook.  Patient learns how to plan ahead and communicate in order to maximize their dining experience in a healthy and nutritious manner. Included are recommended food choices based on the type of restaurant the patient is visiting.   Fueling a Banker conducted group or individual video education with verbal and written material and guidebook.  There is a strong connection between our food choices and our  health. Diseases like obesity and type 2 diabetes are very prevalent and are in large-part due to lifestyle choices. The Pritikin Eating Plan provides plenty of food and hunger-curbing satisfaction. It is easy to follow, affordable, and helps reduce health risks.  Menu Workshop  Clinical staff conducted group or individual video education with verbal and written material and guidebook.  Patient learns that restaurant meals can sabotage health goals because they are often packed with calories, fat, sodium, and sugar. Recommendations include strategies to plan ahead and to communicate with the manager, chef, or server to help order a healthier meal.  Planning Your Eating Strategy  Clinical staff conducted group or individual video education with verbal and written material and guidebook.  Patient learns about the Pritikin Eating Plan  and its benefit of reducing the risk of disease. The Pritikin Eating Plan does not focus on calories. Instead, it emphasizes high-quality, nutrient-rich foods. By knowing the characteristics of the foods, we choose, we can determine their calorie density and make informed decisions.  Targeting Your Nutrition Priorities  Clinical staff conducted group or individual video education with verbal and written material and guidebook.  Patient learns that lifestyle habits have a tremendous impact on disease risk and progression. This video provides eating and physical activity recommendations based on your personal health goals, such as reducing LDL cholesterol, losing weight, preventing or controlling type 2 diabetes, and reducing high blood pressure.  Vitamins and Minerals  Clinical staff conducted group or individual video education with verbal and written material and guidebook.  Patient learns different ways to obtain key vitamins and minerals, including through a recommended healthy diet. It is important to discuss all supplements you take with your doctor.   Healthy Mind-Set    Smoking Cessation  Clinical staff conducted group or individual video education with verbal and written material and guidebook.  Patient learns that cigarette smoking and tobacco addiction pose a serious health risk which affects millions of people. Stopping smoking will significantly reduce the risk of heart disease, lung disease, and many forms of cancer. Recommended strategies for quitting are covered, including working with your doctor to develop a successful plan.  Culinary   Becoming a Set designer conducted group or individual video education with verbal and written material and guidebook.  Patient learns that cooking at home can be healthy, cost-effective, quick, and puts them in control. Keys to cooking healthy recipes will include looking at your recipe, assessing your equipment needs, planning  ahead, making it simple, choosing cost-effective seasonal ingredients, and limiting the use of added fats, salts, and sugars.  Cooking - Breakfast and Snacks  Clinical staff conducted group or individual video education with verbal and written material and guidebook.  Patient learns how important breakfast is to satiety and nutrition through the entire day. Recommendations include key foods to eat during breakfast to help stabilize blood sugar levels and to prevent overeating at meals later in the day. Planning ahead is also a key component.  Cooking - Educational psychologist conducted group or individual video education with verbal and written material and guidebook.  Patient learns eating strategies to improve overall health, including an approach to cook more at home. Recommendations include thinking of animal protein as a side on your plate rather than center stage and focusing instead on lower calorie dense options like vegetables, fruits, whole grains, and plant-based proteins, such as beans. Making sauces in large quantities to freeze for later and leaving the skin  on your vegetables are also recommended to maximize your experience.  Cooking - Healthy Salads and Dressing Clinical staff conducted group or individual video education with verbal and written material and guidebook.  Patient learns that vegetables, fruits, whole grains, and legumes are the foundations of the Pritikin Eating Plan. Recommendations include how to incorporate each of these in flavorful and healthy salads, and how to create homemade salad dressings. Proper handling of ingredients is also covered. Cooking - Soups and [employer] - Soups and Desserts Clinical staff conducted group or individual video education with verbal and written material and guidebook.  Patient learns that Pritikin soups and desserts make for easy, nutritious, and delicious snacks and meal components that are low in sodium, fat, sugar,  and calorie density, while high in vitamins, minerals, and filling fiber. Recommendations include simple and healthy ideas for soups and desserts.   Overview     The Pritikin Solution Program Overview Clinical staff conducted group or individual video education with verbal and written material and guidebook.  Patient learns that the results of the Pritikin Program have been documented in more than 100 articles published in peer-reviewed journals, and the benefits include reducing risk factors for (and, in some cases, even reversing) high cholesterol, high blood pressure, type 2 diabetes, obesity, and more! An overview of the three key pillars of the Pritikin Program will be covered: eating well, doing regular exercise, and having a healthy mind-set.  WORKSHOPS  Exercise: Exercise Basics: Building Your Action Plan Clinical staff led group instruction and group discussion with PowerPoint presentation and patient guidebook. To enhance the learning environment the use of posters, models and videos may be added. At the conclusion of this workshop, patients will comprehend the difference between physical activity and exercise, as well as the benefits of incorporating both, into their routine. Patients will understand the FITT (Frequency, Intensity, Time, and Type) principle and how to use it to build an exercise action plan. In addition, safety concerns and other considerations for exercise and cardiac rehab will be addressed by the presenter. The purpose of this lesson is to promote a comprehensive and effective weekly exercise routine in order to improve patients' overall level of fitness.   Managing Heart Disease: Your Path to a Healthier Heart Clinical staff led group instruction and group discussion with PowerPoint presentation and patient guidebook. To enhance the learning environment the use of posters, models and videos may be added.At the conclusion of this workshop, patients will understand  the anatomy and physiology of the heart. Additionally, they will understand how Pritikin's three pillars impact the risk factors, the progression, and the management of heart disease.  The purpose of this lesson is to provide a high-level overview of the heart, heart disease, and how the Pritikin lifestyle positively impacts risk factors.  Exercise Biomechanics Clinical staff led group instruction and group discussion with PowerPoint presentation and patient guidebook. To enhance the learning environment the use of posters, models and videos may be added. Patients will learn how the structural parts of their bodies function and how these functions impact their daily activities, movement, and exercise. Patients will learn how to promote a neutral spine, learn how to manage pain, and identify ways to improve their physical movement in order to promote healthy living. The purpose of this lesson is to expose patients to common physical limitations that impact physical activity. Participants will learn practical ways to adapt and manage aches and pains, and to minimize their effect on regular exercise. Patients  will learn how to maintain good posture while sitting, walking, and lifting.  Balance Training and Fall Prevention  Clinical staff led group instruction and group discussion with PowerPoint presentation and patient guidebook. To enhance the learning environment the use of posters, models and videos may be added. At the conclusion of this workshop, patients will understand the importance of their sensorimotor skills (vision, proprioception, and the vestibular system) in maintaining their ability to balance as they age. Patients will apply a variety of balancing exercises that are appropriate for their current level of function. Patients will understand the common causes for poor balance, possible solutions to these problems, and ways to modify their physical environment in order to minimize  their fall risk. The purpose of this lesson is to teach patients about the importance of maintaining balance as they age and ways to minimize their risk of falling.  WORKSHOPS   Nutrition:  Fueling a Ship broker led group instruction and group discussion with PowerPoint presentation and patient guidebook. To enhance the learning environment the use of posters, models and videos may be added. Patients will review the foundational principles of the Pritikin Eating Plan and understand what constitutes a serving size in each of the food groups. Patients will also learn Pritikin-friendly foods that are better choices when away from home and review make-ahead meal and snack options. Calorie density will be reviewed and applied to three nutrition priorities: weight maintenance, weight loss, and weight gain. The purpose of this lesson is to reinforce (in a group setting) the key concepts around what patients are recommended to eat and how to apply these guidelines when away from home by planning and selecting Pritikin-friendly options. Patients will understand how calorie density may be adjusted for different weight management goals.  Mindful Eating  Clinical staff led group instruction and group discussion with PowerPoint presentation and patient guidebook. To enhance the learning environment the use of posters, models and videos may be added. Patients will briefly review the concepts of the Pritikin Eating Plan and the importance of low-calorie dense foods. The concept of mindful eating will be introduced as well as the importance of paying attention to internal hunger signals. Triggers for non-hunger eating and techniques for dealing with triggers will be explored. The purpose of this lesson is to provide patients with the opportunity to review the basic principles of the Pritikin Eating Plan, discuss the value of eating mindfully and how to measure internal cues of hunger and fullness using the  Hunger Scale. Patients will also discuss reasons for non-hunger eating and learn strategies to use for controlling emotional eating.  Targeting Your Nutrition Priorities Clinical staff led group instruction and group discussion with PowerPoint presentation and patient guidebook. To enhance the learning environment the use of posters, models and videos may be added. Patients will learn how to determine their genetic susceptibility to disease by reviewing their family history. Patients will gain insight into the importance of diet as part of an overall healthy lifestyle in mitigating the impact of genetics and other environmental insults. The purpose of this lesson is to provide patients with the opportunity to assess their personal nutrition priorities by looking at their family history, their own health history and current risk factors. Patients will also be able to discuss ways of prioritizing and modifying the Pritikin Eating Plan for their highest risk areas  Menu  Clinical staff led group instruction and group discussion with PowerPoint presentation and patient guidebook. To enhance the learning environment the  use of posters, models and videos may be added. Using menus brought in from [employer], or printed from [employer], patients will apply the Pritikin dining out guidelines that were presented in the [employer] video. Patients will also be able to practice these guidelines in a variety of provided scenarios. The purpose of this lesson is to provide patients with the opportunity to practice hands-on learning of the Pritikin Dining Out guidelines with actual menus and practice scenarios.  Label Reading Clinical staff led group instruction and group discussion with PowerPoint presentation and patient guidebook. To enhance the learning environment the use of posters, models and videos may be added. Patients will review and discuss the Pritikin label reading guidelines  presented in Pritikin's Label Reading Educational series video. Using fool labels brought in from local grocery stores and markets, patients will apply the label reading guidelines and determine if the packaged food meet the Pritikin guidelines. The purpose of this lesson is to provide patients with the opportunity to review, discuss, and practice hands-on learning of the Pritikin Label Reading guidelines with actual packaged food labels. Cooking School  Pritikin's [employer] are designed to teach patients ways to prepare quick, simple, and affordable recipes at home. The importance of nutrition's role in chronic disease risk reduction is reflected in its emphasis in the overall Pritikin program. By learning how to prepare essential core Pritikin Eating Plan recipes, patients will increase control over what they eat; be able to customize the flavor of foods without the use of added salt, sugar, or fat; and improve the quality of the food they consume. By learning a set of core recipes which are easily assembled, quickly prepared, and affordable, patients are more likely to prepare more healthy foods at home. These workshops focus on convenient breakfasts, simple entres, side dishes, and desserts which can be prepared with minimal effort and are consistent with nutrition recommendations for cardiovascular risk reduction. Cooking [employer] are taught by a Armed forces logistics/support/administrative officer (RD) who has been trained by the [employer]. The chef or RD has a clear understanding of the importance of minimizing - if not completely eliminating - added fat, sugar, and sodium in recipes. Throughout the series of Cooking School Workshop sessions, patients will learn about healthy ingredients and efficient methods of cooking to build confidence in their capability to prepare    Cooking School weekly topics:  Adding Flavor- Sodium-Free  Fast and Healthy Breakfasts  Powerhouse Plant-Based  Proteins  Satisfying Salads and Dressings  Simple Sides and Sauces  International Cuisine-Spotlight on the [employer] Zones  Delicious Desserts  Savory Soups  [employer] - Meals in a Astronomer Appetizers and Snacks  Comforting Weekend Breakfasts  One-Pot Wonders   Fast Evening Meals  Landscape architect Your Pritikin Plate  WORKSHOPS   Healthy Mindset (Psychosocial):  Focused Goals, Sustainable Changes Clinical staff led group instruction and group discussion with PowerPoint presentation and patient guidebook. To enhance the learning environment the use of posters, models and videos may be added. Patients will be able to apply effective goal setting strategies to establish at least one personal goal, and then take consistent, meaningful action toward that goal. They will learn to identify common barriers to achieving personal goals and develop strategies to overcome them. Patients will also gain an understanding of how our mind-set can impact our ability to achieve goals and the importance of cultivating a positive and growth-oriented mind-set. The purpose of this  lesson is to provide patients with a deeper understanding of how to set and achieve personal goals, as well as the tools and strategies needed to overcome common obstacles which may arise along the way.  From Head to Heart: The Power of a Healthy Outlook  Clinical staff led group instruction and group discussion with PowerPoint presentation and patient guidebook. To enhance the learning environment the use of posters, models and videos may be added. Patients will be able to recognize and describe the impact of emotions and mood on physical health. They will discover the importance of self-care and explore self-care practices which may work for them. Patients will also learn how to utilize the 4 C's to cultivate a healthier outlook and better manage stress and challenges. The purpose of this lesson is to demonstrate  to patients how a healthy outlook is an essential part of maintaining good health, especially as they continue their cardiac rehab journey.  Healthy Sleep for a Healthy Heart Clinical staff led group instruction and group discussion with PowerPoint presentation and patient guidebook. To enhance the learning environment the use of posters, models and videos may be added. At the conclusion of this workshop, patients will be able to demonstrate knowledge of the importance of sleep to overall health, well-being, and quality of life. They will understand the symptoms of, and treatments for, common sleep disorders. Patients will also be able to identify daytime and nighttime behaviors which impact sleep, and they will be able to apply these tools to help manage sleep-related challenges. The purpose of this lesson is to provide patients with a general overview of sleep and outline the importance of quality sleep. Patients will learn about a few of the most common sleep disorders. Patients will also be introduced to the concept of "sleep hygiene," and discover ways to self-manage certain sleeping problems through simple daily behavior changes. Finally, the workshop will motivate patients by clarifying the links between quality sleep and their goals of heart-healthy living.   Recognizing and Reducing Stress Clinical staff led group instruction and group discussion with PowerPoint presentation and patient guidebook. To enhance the learning environment the use of posters, models and videos may be added. At the conclusion of this workshop, patients will be able to understand the types of stress reactions, differentiate between acute and chronic stress, and recognize the impact that chronic stress has on their health. They will also be able to apply different coping mechanisms, such as reframing negative self-talk. Patients will have the opportunity to practice a variety of stress management techniques, such as deep  abdominal breathing, progressive muscle relaxation, and/or guided imagery.  The purpose of this lesson is to educate patients on the role of stress in their lives and to provide healthy techniques for coping with it.  Learning Barriers/Preferences:  Learning Barriers/Preferences - 02/01/23 1059       Learning Barriers/Preferences   Learning Barriers None    Learning Preferences Skilled Demonstration             Education Topics:  Knowledge Questionnaire Score:  Knowledge Questionnaire Score - 02/01/23 1101       Knowledge Questionnaire Score   Pre Score 19/24             Core Components/Risk Factors/Patient Goals at Admission:  Personal Goals and Risk Factors at Admission - 02/01/23 1058       Core Components/Risk Factors/Patient Goals on Admission   Lipids Yes    Intervention Provide education and support for participant on  nutrition & aerobic/resistive exercise along with prescribed medications to achieve LDL 70mg , HDL >40mg .    Expected Outcomes Short Term: Participant states understanding of desired cholesterol values and is compliant with medications prescribed. Participant is following exercise prescription and nutrition guidelines.;Long Term: Cholesterol controlled with medications as prescribed, with individualized exercise RX and with personalized nutrition plan. Value goals: LDL < 70mg , HDL > 40 mg.    Stress Yes    Intervention Offer individual and/or small group education and counseling on adjustment to heart disease, stress management and health-related lifestyle change. Teach and support self-help strategies.;Refer participants experiencing significant psychosocial distress to appropriate mental health specialists for further evaluation and treatment. When possible, include family members and significant others in education/counseling sessions.    Expected Outcomes Short Term: Participant demonstrates changes in health-related behavior, relaxation and other  stress management skills, ability to obtain effective social support, and compliance with psychotropic medications if prescribed.;Long Term: Emotional wellbeing is indicated by absence of clinically significant psychosocial distress or social isolation.             Core Components/Risk Factors/Patient Goals Review:   Goals and Risk Factor Review     Row Name 02/22/23 1052 03/22/23 1059 04/14/23 1439         Core Components/Risk Factors/Patient Goals Review   Personal Goals Review Weight Management/Obesity;Lipids;Stress Weight Management/Obesity;Lipids;Stress Weight Management/Obesity;Lipids;Stress     Review Rainn is off to a good start to exercise at cardiac rehab. Vital signs have been stable. Marlia is currently out with an URI Brenlee is doing well with exercise at cardiac rehab. Vital signs have been stable. Irma continues to do well with exercise at cardiac rehab. Vital signs have been stable. Corean has been increasing her workloads     Expected Outcomes Mellody will continue to participate in cardiac rehab for exercise, nutrition and lifestyle modifications Semaya will continue to participate in cardiac rehab for exercise, nutrition and lifestyle modifications Kadidja will continue to participate in cardiac rehab for exercise, nutrition and lifestyle modifications              Core Components/Risk Factors/Patient Goals at Discharge (Final Review):   Goals and Risk Factor Review - 04/14/23 1439       Core Components/Risk Factors/Patient Goals Review   Personal Goals Review Weight Management/Obesity;Lipids;Stress    Review Mayelin continues to do well with exercise at cardiac rehab. Vital signs have been stable. Enedelia has been increasing her workloads    Expected Outcomes Kaelly will continue to participate in cardiac rehab for exercise, nutrition and lifestyle modifications             ITP Comments:  ITP Comments     Row Name 02/01/23 1024 02/22/23 1030 03/22/23 1056 04/14/23  1436     ITP Comments Medical Director- Dr. Armanda Magic, MD. Introduction to the [facility] / Intensive Cardiac Rehab. Initial orientation packet reviewed with the patient today. 30 Day ITP Review. Itzamara is off to a good start to exercise at cardiac rehab. Portia is currently abesnt from cardiac rehab due to a recent URI. 30 Day ITP Review. Docie has good attendance and participaiton in  cardiac rehab 30 Day ITP Review. Jazelynn continues to have  good attendance and participaiton in cardiac rehab. Timiya will complete cardiac rehab in the beginning of November.             Comments: See ITP comments

## 2023-04-15 ENCOUNTER — Encounter (HOSPITAL_COMMUNITY)
Admission: RE | Admit: 2023-04-15 | Discharge: 2023-04-15 | Disposition: A | Discharge status: Home / Self Care | Payer: Medicare Other | Source: Ambulatory Visit | Attending: Cardiology | Admitting: Cardiology

## 2023-04-15 DIAGNOSIS — Z955 Presence of coronary angioplasty implant and graft: Secondary | ICD-10-CM

## 2023-04-17 NOTE — Assessment & Plan Note (Deleted)
Encouraged to get adequate exercise, calcium and vitamin d intake 

## 2023-04-17 NOTE — Assessment & Plan Note (Deleted)
hgba1c acceptable, minimize simple carbs. Increase exercise as tolerated.  

## 2023-04-17 NOTE — Assessment & Plan Note (Deleted)
Encourage heart healthy diet such as MIND or DASH diet, increase exercise, avoid trans fats, simple carbohydrates and processed foods, consider a krill or fish or flaxseed oil cap daily.  °

## 2023-04-17 NOTE — Assessment & Plan Note (Deleted)
Hydrate and monitor 

## 2023-04-17 NOTE — Assessment & Plan Note (Deleted)
On Levothyroxine, continue to monitor 

## 2023-04-18 ENCOUNTER — Telehealth: Payer: Self-pay | Admitting: Family Medicine

## 2023-04-18 ENCOUNTER — Encounter (HOSPITAL_COMMUNITY)
Admission: RE | Admit: 2023-04-18 | Discharge: 2023-04-18 | Disposition: A | Discharge status: Home / Self Care | Payer: Medicare Other | Source: Ambulatory Visit | Attending: Cardiology

## 2023-04-18 ENCOUNTER — Ambulatory Visit: Payer: Medicare Other | Admitting: Family Medicine

## 2023-04-18 DIAGNOSIS — N189 Chronic kidney disease, unspecified: Secondary | ICD-10-CM

## 2023-04-18 DIAGNOSIS — Z955 Presence of coronary angioplasty implant and graft: Secondary | ICD-10-CM | POA: Diagnosis not present

## 2023-04-18 DIAGNOSIS — R739 Hyperglycemia, unspecified: Secondary | ICD-10-CM

## 2023-04-18 DIAGNOSIS — R252 Cramp and spasm: Secondary | ICD-10-CM

## 2023-04-18 DIAGNOSIS — E039 Hypothyroidism, unspecified: Secondary | ICD-10-CM

## 2023-04-18 DIAGNOSIS — M8580 Other specified disorders of bone density and structure, unspecified site: Secondary | ICD-10-CM

## 2023-04-18 DIAGNOSIS — E785 Hyperlipidemia, unspecified: Secondary | ICD-10-CM

## 2023-04-18 NOTE — Telephone Encounter (Signed)
FYI- Pt called to reschedule appt for this afternoon due to her blood pressure being low. Pt reports bp is 90/60. Offered to transfer patient to triage but she declined and said she just wanted to rest.

## 2023-04-20 ENCOUNTER — Encounter (HOSPITAL_COMMUNITY)
Admission: RE | Admit: 2023-04-20 | Discharge: 2023-04-20 | Disposition: A | Discharge status: Home / Self Care | Payer: Medicare Other | Source: Ambulatory Visit | Attending: Cardiology | Admitting: Cardiology

## 2023-04-20 DIAGNOSIS — Z955 Presence of coronary angioplasty implant and graft: Secondary | ICD-10-CM

## 2023-04-21 ENCOUNTER — Ambulatory Visit: Payer: Medicare Other | Admitting: Physician Assistant

## 2023-04-22 ENCOUNTER — Encounter (HOSPITAL_COMMUNITY)
Admission: RE | Admit: 2023-04-22 | Discharge: 2023-04-22 | Disposition: A | Discharge status: Home / Self Care | Payer: Medicare Other | Source: Ambulatory Visit | Attending: Cardiology

## 2023-04-22 DIAGNOSIS — Z955 Presence of coronary angioplasty implant and graft: Secondary | ICD-10-CM

## 2023-04-25 ENCOUNTER — Encounter (HOSPITAL_COMMUNITY)
Admission: RE | Admit: 2023-04-25 | Discharge: 2023-04-25 | Disposition: A | Discharge status: Home / Self Care | Payer: Medicare Other | Source: Ambulatory Visit | Attending: Cardiology

## 2023-04-25 DIAGNOSIS — Z955 Presence of coronary angioplasty implant and graft: Secondary | ICD-10-CM | POA: Diagnosis not present

## 2023-04-26 DIAGNOSIS — I428 Other cardiomyopathies: Secondary | ICD-10-CM | POA: Diagnosis not present

## 2023-04-26 DIAGNOSIS — E7800 Pure hypercholesterolemia, unspecified: Secondary | ICD-10-CM | POA: Diagnosis not present

## 2023-04-26 DIAGNOSIS — Z789 Other specified health status: Secondary | ICD-10-CM | POA: Diagnosis not present

## 2023-04-26 DIAGNOSIS — I2510 Atherosclerotic heart disease of native coronary artery without angina pectoris: Secondary | ICD-10-CM | POA: Diagnosis not present

## 2023-04-27 ENCOUNTER — Encounter (HOSPITAL_COMMUNITY)
Admission: RE | Admit: 2023-04-27 | Discharge: 2023-04-27 | Disposition: A | Discharge status: Home / Self Care | Payer: Medicare Other | Source: Ambulatory Visit | Attending: Cardiology | Admitting: Cardiology

## 2023-04-27 DIAGNOSIS — Z955 Presence of coronary angioplasty implant and graft: Secondary | ICD-10-CM

## 2023-04-27 NOTE — Assessment & Plan Note (Signed)
Does not tolerate statins, failed Atorvasatatin and Rosuvastatin. She is encouraged to f/u with cardiology and their pharmacy to try a different approach to cholesterol management

## 2023-04-27 NOTE — Assessment & Plan Note (Signed)
Supplement and monitor 

## 2023-04-27 NOTE — Assessment & Plan Note (Signed)
Hydrate and monitor 

## 2023-04-27 NOTE — Assessment & Plan Note (Signed)
hgba1c acceptable, minimize simple carbs. Increase exercise as tolerated.  

## 2023-04-27 NOTE — Assessment & Plan Note (Signed)
On Levothyroxine, continue to monitor 

## 2023-04-27 NOTE — Assessment & Plan Note (Signed)
Asymptomatic, continue to treat risk factors for atherosclerosis

## 2023-04-28 ENCOUNTER — Ambulatory Visit (INDEPENDENT_AMBULATORY_CARE_PROVIDER_SITE_OTHER): Payer: Medicare Other | Admitting: Family Medicine

## 2023-04-28 ENCOUNTER — Encounter: Payer: Self-pay | Admitting: Family Medicine

## 2023-04-28 VITALS — BP 132/80 | HR 72 | Temp 98.3°F | Resp 16 | Ht 65.0 in | Wt 174.8 lb

## 2023-04-28 DIAGNOSIS — E559 Vitamin D deficiency, unspecified: Secondary | ICD-10-CM

## 2023-04-28 DIAGNOSIS — T466X5A Adverse effect of antihyperlipidemic and antiarteriosclerotic drugs, initial encounter: Secondary | ICD-10-CM

## 2023-04-28 DIAGNOSIS — I700 Atherosclerosis of aorta: Secondary | ICD-10-CM | POA: Diagnosis not present

## 2023-04-28 DIAGNOSIS — E039 Hypothyroidism, unspecified: Secondary | ICD-10-CM | POA: Diagnosis not present

## 2023-04-28 DIAGNOSIS — D582 Other hemoglobinopathies: Secondary | ICD-10-CM

## 2023-04-28 DIAGNOSIS — R252 Cramp and spasm: Secondary | ICD-10-CM

## 2023-04-28 DIAGNOSIS — G720 Drug-induced myopathy: Secondary | ICD-10-CM

## 2023-04-28 DIAGNOSIS — R739 Hyperglycemia, unspecified: Secondary | ICD-10-CM | POA: Diagnosis not present

## 2023-04-28 DIAGNOSIS — E785 Hyperlipidemia, unspecified: Secondary | ICD-10-CM | POA: Diagnosis not present

## 2023-04-28 DIAGNOSIS — N189 Chronic kidney disease, unspecified: Secondary | ICD-10-CM

## 2023-04-28 LAB — TSH: TSH: 1.6 u[IU]/mL (ref 0.35–5.50)

## 2023-04-28 LAB — LIPID PANEL
Cholesterol: 216 mg/dL — ABNORMAL HIGH (ref 0–200)
HDL: 60.3 mg/dL (ref >39.00)
LDL Cholesterol: 133 mg/dL — ABNORMAL HIGH (ref 0–99)
NonHDL: 155.63
Total CHOL/HDL Ratio: 4
Triglycerides: 111 mg/dL (ref 0.0–149.0)
VLDL: 22.2 mg/dL (ref 0.0–40.0)

## 2023-04-28 LAB — CBC WITH DIFFERENTIAL/PLATELET
Basophils Absolute: 0.1 10*3/uL (ref 0.0–0.1)
Basophils Relative: 1.1 % (ref 0.0–3.0)
Eosinophils Absolute: 0.1 10*3/uL (ref 0.0–0.7)
Eosinophils Relative: 1.7 % (ref 0.0–5.0)
HCT: 41.7 % (ref 36.0–46.0)
Hemoglobin: 13.4 g/dL (ref 12.0–15.0)
Lymphocytes Relative: 32.9 % (ref 12.0–46.0)
Lymphs Abs: 2.4 10*3/uL (ref 0.7–4.0)
MCHC: 32.2 g/dL (ref 30.0–36.0)
MCV: 96.1 fL (ref 78.0–100.0)
Monocytes Absolute: 0.6 10*3/uL (ref 0.1–1.0)
Monocytes Relative: 8.3 % (ref 3.0–12.0)
Neutro Abs: 4 10*3/uL (ref 1.4–7.7)
Neutrophils Relative %: 56 % (ref 43.0–77.0)
Platelets: 267 10*3/uL (ref 150.0–400.0)
RBC: 4.34 Mil/uL (ref 3.87–5.11)
RDW: 14.4 % (ref 11.5–15.5)
WBC: 7.2 10*3/uL (ref 4.0–10.5)

## 2023-04-28 LAB — COMPREHENSIVE METABOLIC PANEL
ALT: 17 U/L (ref 0–35)
AST: 22 U/L (ref 0–37)
Albumin: 4 g/dL (ref 3.5–5.2)
Alkaline Phosphatase: 91 U/L (ref 39–117)
BUN: 18 mg/dL (ref 6–23)
CO2: 27 meq/L (ref 19–32)
Calcium: 9 mg/dL (ref 8.4–10.5)
Chloride: 105 meq/L (ref 96–112)
Creatinine, Ser: 0.91 mg/dL (ref 0.40–1.20)
GFR: 65.93 mL/min (ref >60.00)
Glucose, Bld: 91 mg/dL (ref 70–99)
Potassium: 4.4 meq/L (ref 3.5–5.1)
Sodium: 141 meq/L (ref 135–145)
Total Bilirubin: 0.5 mg/dL (ref 0.2–1.2)
Total Protein: 6.4 g/dL (ref 6.0–8.3)

## 2023-04-28 LAB — MAGNESIUM: Magnesium: 2 mg/dL (ref 1.5–2.5)

## 2023-04-28 LAB — HEMOGLOBIN A1C: Hgb A1c MFr Bld: 6 % (ref 4.6–6.5)

## 2023-04-28 MED ORDER — NEOMYCIN-POLYMYXIN-HC 3.5-10000-1 OT SOLN: 3.0000 [drp] | Freq: Three times a day (TID) | OTIC | 0 refills | Status: DC

## 2023-04-28 MED ORDER — TRIAMCINOLONE ACETONIDE 0.1 % EX CREA: 1.0000 | TOPICAL_CREAM | Freq: Two times a day (BID) | CUTANEOUS | 1 refills | Status: AC

## 2023-04-28 NOTE — Patient Instructions (Signed)
Prevnar 20 at pharmacy  Tetanus was due in 2012 definitely take if injured  Covid and Flu boosters annually  RSV, Respiratory Syncitial Virus Vaccine, Arexvy vaccine at pharmacy  Shingrix is the new shingles shot, 2 shots over 2-6 months, confirm coverage with insurance and document, then can return here for shots with nurse appt or at pharmacy
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
## 2023-04-29 ENCOUNTER — Encounter (HOSPITAL_COMMUNITY)
Admission: RE | Admit: 2023-04-29 | Discharge: 2023-04-29 | Disposition: A | Discharge status: Home / Self Care | Payer: Medicare Other | Source: Ambulatory Visit | Attending: Cardiology | Admitting: Cardiology

## 2023-04-29 DIAGNOSIS — Z955 Presence of coronary angioplasty implant and graft: Secondary | ICD-10-CM | POA: Diagnosis not present

## 2023-04-29 DIAGNOSIS — Z48812 Encounter for surgical aftercare following surgery on the circulatory system: Secondary | ICD-10-CM | POA: Diagnosis not present

## 2023-05-02 ENCOUNTER — Encounter (HOSPITAL_COMMUNITY)
Admission: RE | Admit: 2023-05-02 | Discharge: 2023-05-02 | Discharge status: Home / Self Care | Payer: Medicare Other | Source: Ambulatory Visit | Attending: Cardiology

## 2023-05-02 DIAGNOSIS — Z955 Presence of coronary angioplasty implant and graft: Secondary | ICD-10-CM | POA: Diagnosis not present

## 2023-05-02 DIAGNOSIS — Z48812 Encounter for surgical aftercare following surgery on the circulatory system: Secondary | ICD-10-CM | POA: Diagnosis not present

## 2023-05-04 ENCOUNTER — Encounter (HOSPITAL_COMMUNITY)
Admission: RE | Admit: 2023-05-04 | Discharge: 2023-05-04 | Disposition: A | Discharge status: Home / Self Care | Payer: Medicare Other | Source: Ambulatory Visit | Attending: Cardiology | Admitting: Cardiology

## 2023-05-04 DIAGNOSIS — Z48812 Encounter for surgical aftercare following surgery on the circulatory system: Secondary | ICD-10-CM | POA: Diagnosis not present

## 2023-05-04 DIAGNOSIS — Z955 Presence of coronary angioplasty implant and graft: Secondary | ICD-10-CM

## 2023-05-06 ENCOUNTER — Encounter (HOSPITAL_COMMUNITY)
Admission: RE | Admit: 2023-05-06 | Discharge: 2023-05-06 | Disposition: A | Discharge status: Home / Self Care | Payer: Medicare Other | Source: Ambulatory Visit | Attending: Cardiology

## 2023-05-06 DIAGNOSIS — Z48812 Encounter for surgical aftercare following surgery on the circulatory system: Secondary | ICD-10-CM | POA: Diagnosis not present

## 2023-05-06 DIAGNOSIS — Z955 Presence of coronary angioplasty implant and graft: Secondary | ICD-10-CM | POA: Diagnosis not present

## 2023-05-09 ENCOUNTER — Encounter (HOSPITAL_COMMUNITY): Payer: Medicare Other

## 2023-05-11 ENCOUNTER — Encounter (HOSPITAL_COMMUNITY)
Admission: RE | Admit: 2023-05-11 | Discharge: 2023-05-11 | Disposition: A | Discharge status: Home / Self Care | Payer: Medicare Other | Source: Ambulatory Visit | Attending: Cardiology | Admitting: Cardiology

## 2023-05-11 VITALS — Ht 65.0 in | Wt 173.1 lb

## 2023-05-11 DIAGNOSIS — Z955 Presence of coronary angioplasty implant and graft: Secondary | ICD-10-CM | POA: Diagnosis not present

## 2023-05-11 DIAGNOSIS — Z48812 Encounter for surgical aftercare following surgery on the circulatory system: Secondary | ICD-10-CM | POA: Diagnosis not present

## 2023-05-13 ENCOUNTER — Encounter (HOSPITAL_COMMUNITY)
Admission: RE | Admit: 2023-05-13 | Discharge: 2023-05-13 | Disposition: A | Discharge status: Home / Self Care | Payer: Medicare Other | Source: Ambulatory Visit | Attending: Cardiology | Admitting: Cardiology

## 2023-05-13 DIAGNOSIS — Z955 Presence of coronary angioplasty implant and graft: Secondary | ICD-10-CM | POA: Diagnosis not present

## 2023-05-13 DIAGNOSIS — Z48812 Encounter for surgical aftercare following surgery on the circulatory system: Secondary | ICD-10-CM | POA: Diagnosis not present

## 2023-05-13 NOTE — Progress Notes (Signed)
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 modification. Goals in progress. Minervia continues to attend the [facility] education and nutrition series reguarly. She has improved understanding of benefits of increased fiber intake, saturated fat recommendations, and reading food labels for sodium/sugar. She reports prioritizing high fiber foods and lean proteins in her diet. She reports no nutrition questions/concerns at this time. She is down 2.2# since starting with our program. Patient will benefit from participation in intensive cardiac rehab for nutrition, exercise, and lifestyle modification. Goals in action. Elzena continues to attend the [facility] education and nutrition series reguarly. She has improved understanding  of benefits of increased fiber intake, saturated fat recommendations, and reading food labels for sodium/sugar. She reports prioritizing high fiber foods and lean proteins in her diet. She is unable to take statin medications; did discuss Repatha and Leqvio  at cardiology follow-up on 04/04/23 but she declined at this time. She will have updated labs at PCP appointment at the end of this month. She is up 3.7# since starting with our program. Patient will benefit from participation in intensive cardiac rehab for nutrition, exercise, and lifestyle modification. Goals in action. Cesily continues to attend the [facility] education and nutrition series reguarly. She has improved understanding of benefits of increased fiber intake, saturated fat recommendations, and reading food labels for sodium/sugar. She reports prioritizing high fiber foods and lean proteins in her diet. She is unable to take statin medications; did discuss Repatha and Leqvio at cardiology follow-up on 04/04/23 but she declined at this time. Cholesterol and LDL remain elevated. Her A1c remains in a prediabetic range. She has maintained her weight since starting with our program. Patient will benefit from participation in intensive cardiac rehab for nutrition, exercise, and lifestyle modification.             Nutrition Goals Re-Evaluation:  Nutrition Goals Re-Evaluation     Row Name 02/07/23 0944 03/11/23 1021 04/08/23 1151 05/09/23 1123       Goals   Current Weight 169 lb 15.6 oz (77.1 kg) 167 lb 12.3 oz (76.1 kg) 173 lb 11.6 oz (78.8 kg) 171 lb 8.3 oz (77.8 kg)    Comment total cholesterol 214, LDL 144 (lipitor is listed as an allergy), A1c 5.9 no new labs; most recent labs total cholesterol 214, LDL 144 (lipitor is listed as an allergy), A1c 5.9 no new labs; most recent labs total cholesterol 214, LDL 144 (lipitor and crestor are listed as an allergy), A1c 5.9 A1c 6.0, cholesterol 216, LDL 133    Expected Outcome Modean has medical history  of cancer; she reports that during this treatment she received significant nutrition education. She reports prioritizing high fiber foods and lean proteins. She reports no nutrition questions/concerns at this time. Patient will benefit from participation in intensive cardiac rehab for nutrition, exercise, and lifestyle modification. Goals in progress. Laliah continues to attend the [employer] education and nutrition series reguarly. She has improved understanding of benefits of increased fiber intake, saturated fat recommendations, and reading food labels for sodium/sugar. She reports prioritizing high fiber foods and lean proteins in her diet. She reports no nutrition questions/concerns at this time. She is down 2.2# since starting with our program. Patient will benefit from participation in intensive cardiac rehab for nutrition, exercise, and lifestyle modification. Goals in action. Quatisha continues to attend the [employer] education and nutrition series reguarly. She has improved understanding of benefits of increased fiber intake, saturated fat recommendations, and reading food labels for sodium/sugar. She reports prioritizing high fiber foods and lean proteins in her diet. She is unable to take statin medications; did discuss Repatha and Leqvio at cardiology follow-up on 04/04/23 but she declined at this time. She will have updated labs at PCP appointment at the end of this month. She is up 3.7# since starting with our program. Patient will benefit from participation in intensive cardiac rehab for nutrition, exercise, and lifestyle modification. Goals in action. Placida continues to attend the [facility] education and nutrition series reguarly. She has improved understanding of benefits of increased fiber intake, saturated fat recommendations, and reading food labels for sodium/sugar. She reports prioritizing high fiber foods and lean proteins in her diet. She is unable to take statin medications; did discuss Repatha and  Leqvio at cardiology follow-up on 04/04/23 but she declined at this time. Cholesterol and LDL remain elevated. Her A1c remains  in a prediabetic range. She has maintained her weight since starting with our program. Patient will benefit from participation in intensive cardiac rehab for nutrition, exercise, and lifestyle modification.             Nutrition Goals Discharge (Final Nutrition Goals Re-Evaluation):  Nutrition Goals Re-Evaluation - 05/09/23 1123       Goals   Current Weight 171 lb 8.3 oz (77.8 kg)    Comment A1c 6.0, cholesterol 216, LDL 133    Expected Outcome Goals in action. Tywana continues to attend the [facility] education and nutrition series reguarly. She has improved understanding of benefits of increased fiber intake, saturated fat recommendations, and reading food labels for sodium/sugar. She reports prioritizing high fiber foods and lean proteins in her diet. She is unable to take statin medications; did discuss Repatha and Leqvio at cardiology follow-up on 04/04/23 but she declined at this time. Cholesterol and LDL remain elevated. Her A1c remains in a prediabetic range. She has maintained her weight since starting with our program. Patient will benefit from participation in intensive cardiac rehab for nutrition, exercise, and lifestyle modification.             Psychosocial: Target Goals: Acknowledge presence or absence of significant depression and/or stress, maximize coping skills, provide positive support system. Participant is able to verbalize types and ability to use techniques and skills needed for reducing stress and depression.  Initial Review & Psychosocial Screening:  Initial Psych Review & Screening - 02/01/23 1108       Initial Review   Current issues with History of Depression;Current Stress Concerns    Source of Stress Concerns Family    Comments Adanna takes care of her husband with diabetes.      Family Dynamics   Good Support System? 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 She talks to her preacher and a friend for support when needed.      Barriers   Psychosocial barriers to participate in program The patient should benefit from training in stress management and relaxation.      Screening Interventions   Interventions Encouraged to exercise;Provide feedback about the scores to participant    Expected Outcomes Short Term goal: Utilizing psychosocial counselor, staff and physician to assist with identification of specific Stressors or current issues interfering with healing process. Setting desired goal for each stressor or current issue identified.;Long Term Goal: Stressors or current issues are controlled or eliminated.;Short Term goal: Identification and review with participant of any Quality of Life or Depression concerns found by scoring the questionnaire.;Long Term goal: The participant improves quality of Life and PHQ9 Scores as seen by post scores and/or verbalization of changes             Quality of Life Scores:  Quality of Life - 05/12/23 0959       Quality of Life Scores   Health/Function Pre 21.3 %    Health/Function Post 23.53 %    Health/Function % Change 10.47 %    Socioeconomic Pre 24.2 %    Socioeconomic Post 26.36 %    Socioeconomic % Change  8.93 %    Psych/Spiritual Pre 22.93 %    Psych/Spiritual Post 24.64 %    Psych/Spiritual % Change 7.46 %    Family Pre 20.1 %    Family Post 23.5 %    Family % Change 16.92 %    GLOBAL Pre 21.84 %    GLOBAL Post 24.34 %  GLOBAL % Change 11.45 %            Scores of 19 and below usually indicate a poorer quality of life in these areas.  A difference of  2-3 points is a clinically meaningful difference.  A difference of 2-3 points in the total score of the Quality of Life Index has been associated with significant improvement in overall quality of life, self-image, physical symptoms, and general health in studies assessing change in quality  of life.  PHQ-9: Review Flowsheet  More data exists      02/01/2023 01/10/2023 10/28/2022 07/26/2022 07/21/2022  Depression screen PHQ 2/9  Decreased Interest 0 0 0 0 0  Down, Depressed, Hopeless 0 0 0 1 0  PHQ - 2 Score 0 0 0 1 0  Altered sleeping 1 - 0 0 -  Tired, decreased energy 1 - 0 0 -  Change in appetite 0 - 0 0 -  Feeling bad or failure about yourself  0 - 0 0 -  Trouble concentrating 0 - 0 0 -  Moving slowly or fidgety/restless 0 - 0 0 -  Suicidal thoughts 0 - 0 0 -  PHQ-9 Score 2 - 0 1 -  Difficult doing work/chores Somewhat difficult - Not difficult at all Not difficult at all -    Details           Interpretation of Total Score  Total Score Depression Severity:  1-4 = Minimal depression, 5-9 = Mild depression, 10-14 = Moderate depression, 15-19 = Moderately severe depression, 20-27 = Severe depression   Psychosocial Evaluation and Intervention:   Psychosocial Re-Evaluation:  Psychosocial Re-Evaluation     Row Name 02/22/23 1040 03/22/23 1058 04/14/23 1438 05/13/23 1114       Psychosocial Re-Evaluation   Current issues with History of Depression;Current Stress Concerns History of Depression;Current Stress Concerns History of Depression;Current Stress Concerns History of Depression;Current Stress Concerns    Comments Maydelle has not voiced any increased concerns or stressors during exercise at cardiac rehab. Omya is currently out witha URI. Katrell has not voiced any increased concerns or stressors during exercise at cardiac rehab. Gertha has not voiced any increased concerns or stressors during exercise at cardiac rehab. Radhika will complete cardiac rehab ain November. Vennela has not voiced any increased concerns or stressors during exercise at cardiac rehab. Nakema will complete cardiac rehab  November 20th.    Expected Outcomes Carlisa will have decreased or controlled stress upon completion of cardiac rehab Christelle will have decreased or controlled stress upon completion of  cardiac rehab Amy-Lee will have decreased or controlled stress upon completion of cardiac rehab Dannia will have decreased or controlled stress upon completion of cardiac rehab    Interventions Stress management education;Encouraged to attend Cardiac Rehabilitation for the exercise;Relaxation education Stress management education;Encouraged to attend Cardiac Rehabilitation for the exercise;Relaxation education Stress management education;Encouraged to attend Cardiac Rehabilitation for the exercise;Relaxation education Stress management education;Encouraged to attend Cardiac Rehabilitation for the exercise;Relaxation education    Continue Psychosocial Services  No Follow up required No Follow up required No Follow up required No Follow up required      Initial Review   Source of Stress Concerns Family Family Family Family    Comments Will continue to monitor and offer support as needed Will continue to monitor and offer support as needed Will continue to monitor and offer support as needed Will continue to monitor and offer support as needed  Psychosocial Discharge (Final Psychosocial Re-Evaluation):  Psychosocial Re-Evaluation - 05/13/23 1114       Psychosocial Re-Evaluation   Current issues with History of Depression;Current Stress Concerns    Comments Margherita has not voiced any increased concerns or stressors during exercise at cardiac rehab. Kenyarda will complete cardiac rehab  November 20th.    Expected Outcomes Eva will have decreased or controlled stress upon completion of cardiac rehab    Interventions Stress management education;Encouraged to attend Cardiac Rehabilitation for the exercise;Relaxation education    Continue Psychosocial Services  No Follow up required      Initial Review   Source of Stress Concerns Family    Comments Will continue to monitor and offer support as needed             Vocational Rehabilitation: Provide vocational rehab assistance to  qualifying candidates.   Vocational Rehab Evaluation & Intervention:  Vocational Rehab - 02/01/23 1059       Initial Vocational Rehab Evaluation & Intervention   Assessment shows need for Vocational Rehabilitation No   Retired            Education: Education Goals: Education classes will be provided on a weekly basis, covering required topics. Participant will state understanding/return demonstration of topics presented.    Education     Row Name 02/09/23 1000     Education   Cardiac Education Topics Pritikin   Secondary school teacher School   Educator Dietitian   Weekly Topic Powerhouse Plant-Based Proteins   Instruction Review Code 1- Verbalizes Understanding   Class Start Time 0815   Class Stop Time 0856   Class Time Calculation (min) 41 min    Row Name 02/11/23 0900     Education   Cardiac Education Topics P[facility]ikin   Select Core Videos     Core Videos   Educator Exercise Physiologist   Select General Education   General Education Hypertension and Heart Disease   Instruction Review Code 1- Verbalizes Understanding   Class Start Time 0810   Class Stop Time 0850   Class Time Calculation (min) 40 min    Row Name 02/14/23 0900     Education   Cardiac Education Topics Pritikin   Geographical information systems officer Psychosocial   Psychosocial Workshop From Head to Heart: The Power of a Healthy Outlook   Instruction Review Code 1- Verbalizes Understanding   Class Start Time 0815   Class Stop Time 0904   Class Time Calculation (min) 49 min    Row Name 02/16/23 1000     Education   Cardiac Education Topics Pritikin   Secondary school teacher School   Educator Dietitian   Weekly Topic Adding Flavor - Sodium-Free   Instruction Review Code 1- Verbalizes Understanding   Class Start Time [phone]   Class Stop Time 0845   Class Time Calculation (min) 31 min    Row Name 03/02/23 1000     Education    Cardiac Education Topics Pritikin   Secondary school teacher School   Educator Dietitian   Weekly Topic Personalizing Your Pritikin Plate   Instruction Review Code 1- Verbalizes Understanding   Class Start Time 0815   Class Stop Time 0848   Class Time Calculation (min) 33 min    Row Name 03/04/23 0900     Education   Cardiac Education Topics  Pritikin   Geographical information systems officer Exercise   Exercise Workshop Location manager and Fall Prevention   Instruction Review Code 1- Verbalizes Understanding   Class Start Time [phone]   Class Stop Time 0856   Class Time Calculation (min) 45 min    Row Name 03/07/23 0900     Education   Cardiac Education Topics Pritikin   Glass blower/designer Nutrition   Nutrition Workshop Label Reading   Instruction Review Code 1- Verbalizes Understanding   Class Start Time 0815   Class Stop Time 0904   Class Time Calculation (min) 49 min    Row Name 03/11/23 1000     Education   Cardiac Education Topics Pritikin   Nurse, children's   Educator Dietitian   Select Nutrition   Nutrition Other  Label Reading   Instruction Review Code 1- Verbalizes Understanding   Class Start Time 0815   Class Stop Time 0900   Class Time Calculation (min) 45 min    Row Name 03/14/23 1100     Education   Cardiac Education Topics Pritikin   Select Workshops     Workshops   Educator Exercise Physiologist   Select Psychosocial   Psychosocial Workshop Recognizing and Reducing Stress   Instruction Review Code 1- Verbalizes Understanding   Class Start Time 0815   Class Stop Time 0900   Class Time Calculation (min) 45 min    Row Name 03/16/23 1000     Education   Cardiac Education Topics Pritikin   Orthoptist   Educator Dietitian   Weekly Topic Tasty Appetizers and Snacks   Instruction Review Code 1-  Verbalizes Understanding   Class Start Time 0815   Class Stop Time 0900   Class Time Calculation (min) 45 min    Row Name 03/18/23 1300     Education   Cardiac Education Topics Pritikin   Nurse, children's Exercise Physiologist   Select Nutrition   Nutrition Calorie Density   Instruction Review Code 1- Verbalizes Understanding   Class Start Time [phone]   Class Stop Time 0900   Class Time Calculation (min) 44 min    Row Name 03/21/23 0900     Education   Cardiac Education Topics Pritikin   Immunologist Exercise Physiologist   Select Exercise   Exercise Workshop Exercise Basics: Building Your Action Plan   Instruction Review Code 1- Verbalizes Understanding   Class Start Time 0813   Class Stop Time 0900   Class Time Calculation (min) 47 min    Row Name 03/28/23 1000     Education   Cardiac Education Topics Pritikin   Glass blower/designer Nutrition   Nutrition Workshop Targeting Your Nutrition Priorities   Instruction Review Code 1- Verbalizes Understanding   Class Start Time 0815   Class Stop Time 0900   Class Time Calculation (min) 45 min    Row Name 03/30/23 0900     Education   Cardiac Education Topics Pritikin   Set designer Dietitian   Weekly Topic One-Pot Wonders   Instruction Review Code 1-  Verbalizes Understanding   Class Start Time 0815   Class Stop Time 0855   Class Time Calculation (min) 40 min    Row Name 04/01/23 0900     Education   Cardiac Education Topics Pritikin   Select Core Videos     Core Videos   Educator Exercise Physiologist   Select General Education   General Education Hypertension and Heart Disease   Instruction Review Code 1- Verbalizes Understanding   Class Start Time 0818   Class Stop Time 0850   Class Time Calculation (min) 32 min    Row Name 04/04/23 1000     Education    Cardiac Education Topics Pritikin   Select Core Videos     Core Videos   Educator Dietitian   Select Nutrition   Nutrition Dining Out - Part 1   Instruction Review Code 1- Verbalizes Understanding   Class Start Time 0815   Class Stop Time 0855   Class Time Calculation (min) 40 min    Row Name 04/06/23 1100     Education   Cardiac Education Topics Pritikin   Customer service manager   Weekly Topic Comforting Weekend Breakfasts   Instruction Review Code 1- Verbalizes Understanding   Class Start Time 0815   Class Stop Time 0900   Class Time Calculation (min) 45 min    Row Name 04/08/23 1100     Education   Cardiac Education Topics Pritikin   Select Workshops     Workshops   Educator Exercise Physiologist   Select Psychosocial   Psychosocial Workshop Focused Goals, Sustainable Changes   Instruction Review Code 1- Verbalizes Understanding   Class Start Time [phone]   Class Stop Time 0848   Class Time Calculation (min) 37 min    Row Name 04/11/23 1200     Education   Cardiac Education Topics Pritikin   Psychologist, forensic Exercise Education   Exercise Education Biomechanial Limitations   Instruction Review Code 1- Verbalizes Understanding   Class Start Time 0815   Class Stop Time 0855   Class Time Calculation (min) 40 min    Row Name 04/13/23 0800     Education   Cardiac Education Topics Pritikin   Select Core Videos     Core Videos   Educator Exercise Physiologist   Select Nutrition   Nutrition Vitamins and Minerals   Instruction Review Code 1- Verbalizes Understanding   Class Start Time [phone]   Class Stop Time 0902   Class Time Calculation (min) 48 min    Row Name 04/15/23 0900     Education   Cardiac Education Topics Pritikin   Secondary school teacher School   Educator Dietitian   Weekly Topic Fast Evening Meals   Instruction Review Code 1-  Verbalizes Understanding   Class Start Time 0815   Class Stop Time 0850   Class Time Calculation (min) 35 min    Row Name 04/18/23 0900     Education   Cardiac Education Topics Pritikin   Select Workshops     Workshops   Educator Dietitian   Select Nutrition   Nutrition Workshop Label Reading   Instruction Review Code 1- Verbalizes Understanding   Class Start Time 0815   Class Stop Time 0902   Class Time Calculation (min) 47 min    Row Name 04/20/23  0900     Education   Cardiac Education Topics Pritikin   Customer service manager   Weekly Topic International Cuisine- Spotlight on the [facility] Zones   Instruction Review Code 1- Verbalizes Understanding   Class Start Time 0815   Class Stop Time [phone]   Class Time Calculation (min) 37 min    Row Name 04/22/23 0900     Education   Cardiac Education Topics Pritikin   Select Core Videos     Core Videos   Educator Exercise Physiologist   Select Exercise Education   Exercise Education Improving Performance   Instruction Review Code 1- Verbalizes Understanding   Class Start Time 0815   Class Stop Time [phone]   Class Time Calculation (min) 37 min    Row Name 04/27/23 0900     Education   Cardiac Education Topics Pritikin   Orthoptist   Educator Dietitian   Weekly Topic Simple Sides and Sauces   Instruction Review Code 1- Verbalizes Understanding   Class Start Time 0815   Class Stop Time 0900   Class Time Calculation (min) 45 min    Row Name 04/29/23 0900     Education   Cardiac Education Topics Pritikin   Select Core Videos     Core Videos   Educator Exercise Physiologist   Select Psychosocial   Psychosocial How Our Thoughts Can Heal Our Hearts   Instruction Review Code 1- Verbalizes Understanding   Class Start Time 0813   Class Stop Time 0850   Class Time Calculation (min) 37 min    Row Name 05/02/23 0900     Education   Cardiac  Education Topics Pritikin   Select Workshops     Workshops   Educator Exercise Physiologist   Select Exercise   Exercise Workshop Managing Heart Disease: Your Path to a Healthier Heart   Instruction Review Code 1- Verbalizes Understanding   Class Start Time [phone]   Class Stop Time 0848   Class Time Calculation (min) 37 min    Row Name 05/04/23 0900     Education   Cardiac Education Topics Pritikin   Secondary school teacher School   Educator Dietitian   Weekly Topic Powerhouse Plant-Based Proteins   Instruction Review Code 1- Verbalizes Understanding   Class Start Time 0815   Class Stop Time 0858   Class Time Calculation (min) 43 min    Row Name 05/06/23 0800     Education   Cardiac Education Topics Pritikin   Select Core Videos     Core Videos   Educator Exercise Physiologist   Select General Education   General Education Hypertension and Heart Disease   Instruction Review Code 1- Verbalizes Understanding   Class Start Time 0815   Class Stop Time 0903   Class Time Calculation (min) 48 min    Row Name 05/11/23 0900     Education   Cardiac Education Topics Pritikin   Secondary school teacher School   Educator Dietitian   Weekly Topic Adding Flavor - Sodium-Free   Instruction Review Code 1- Verbalizes Understanding   Class Start Time 0815   Class Stop Time 0850   Class Time Calculation (min) 35 min    Row Name 05/13/23 0800     Education   Cardiac Education Topics Pritikin   Select Core Videos  Core Videos   Educator Exercise Industrial/product designer Education   General Education Heart Disease Risk Reduction   Instruction Review Code 1- Verbalizes Understanding   Class Start Time [phone]   Class Stop Time 0858   Class Time Calculation (min) 42 min            Core Videos: Exercise    Move It!  Clinical staff conducted group or individual video education with verbal and written material and guidebook.  Patient learns  the recommended Pritikin exercise program. Exercise with the goal of living a long, healthy life. Some of the health benefits of exercise include controlled diabetes, healthier blood pressure levels, improved cholesterol levels, improved heart and lung capacity, improved sleep, and better body composition. Everyone should speak with their doctor before starting or changing an exercise routine.  Biomechanical Limitations Clinical staff conducted group or individual video education with verbal and written material and guidebook.  Patient learns how biomechanical limitations can impact exercise and how we can mitigate and possibly overcome limitations to have an impactful and balanced exercise routine.  Body Composition Clinical staff conducted group or individual video education with verbal and written material and guidebook.  Patient learns that body composition (ratio of muscle mass to fat mass) is a key component to assessing overall fitness, rather than body weight alone. Increased fat mass, especially visceral belly fat, can put [place] at increased risk for metabolic syndrome, type 2 diabetes, heart disease, and even death. It is recommended to combine diet and exercise (cardiovascular and resistance training) to improve your body composition. Seek guidance from your physician and exercise physiologist before implementing an exercise routine.  Exercise Action Plan Clinical staff conducted group or individual video education with verbal and written material and guidebook.  Patient learns the recommended strategies to achieve and enjoy long-term exercise adherence, including variety, self-motivation, self-efficacy, and positive decision making. Benefits of exercise include fitness, good health, weight management, more energy, better sleep, less stress, and overall well-being.  Medical   Heart Disease Risk Reduction Clinical staff conducted group or individual video education with verbal and written  material and guidebook.  Patient learns our heart is our most vital organ as it circulates oxygen, nutrients, white blood cells, and hormones throughout the entire body, and carries waste away. Data supports a plant-based eating plan like the Pritikin Program for its effectiveness in slowing progression of and reversing heart disease. The video provides a number of recommendations to address heart disease.   Metabolic Syndrome and Belly Fat  Clinical staff conducted group or individual video education with verbal and written material and guidebook.  Patient learns what metabolic syndrome is, how it leads to heart disease, and how one can reverse it and keep it from coming back. You have metabolic syndrome if you have 3 of the following 5 criteria: abdominal obesity, high blood pressure, high triglycerides, low HDL cholesterol, and high blood sugar.  Hypertension and Heart Disease Clinical staff conducted group or individual video education with verbal and written material and guidebook.  Patient learns that high blood pressure, or hypertension, is very common in the [place]. Hypertension is largely due to excessive salt intake, but other important risk factors include being overweight, physical inactivity, drinking too much alcohol, smoking, and not eating enough potassium from fruits and vegetables. High blood pressure is a leading risk factor for heart attack, stroke, congestive heart failure, dementia, kidney failure, and premature death. Long-term effects of excessive salt intake include stiffening of  the arteries and thickening of heart muscle and organ damage. Recommendations include ways to reduce hypertension and the risk of heart disease.  Diseases of Our Time - Focusing on Diabetes Clinical staff conducted group or individual video education with verbal and written material and guidebook.  Patient learns why the best way to stop diseases of our time is prevention, through food and other  lifestyle changes. Medicine (such as prescription pills and surgeries) is often only a Band-Aid on the problem, not a long-term solution. Most common diseases of our time include obesity, type 2 diabetes, hypertension, heart disease, and cancer. The Pritikin Program is recommended and has been proven to help reduce, reverse, and/or prevent the damaging effects of metabolic syndrome.  Nutrition   Overview of the Pritikin Eating Plan  Clinical staff conducted group or individual video education with verbal and written material and guidebook.  Patient learns about the Pritikin Eating Plan for disease risk reduction. The Pritikin Eating Plan emphasizes a wide variety of unrefined, minimally-processed carbohydrates, like fruits, vegetables, whole grains, and legumes. Go, Caution, and Stop food choices are explained. Plant-based and lean animal proteins are emphasized. Rationale provided for low sodium intake for blood pressure control, low added sugars for blood sugar stabilization, and low added fats and oils for coronary artery disease risk reduction and weight management.  Calorie Density  Clinical staff conducted group or individual video education with verbal and written material and guidebook.  Patient learns about calorie density and how it impacts the Pritikin Eating Plan. Knowing the characteristics of the food you choose will help you decide whether those foods will lead to weight gain or weight loss, and whether you want to consume more or less of them. Weight loss is usually a side effect of the Pritikin Eating Plan because of its focus on low calorie-dense foods.  Label Reading  Clinical staff conducted group or individual video education with verbal and written material and guidebook.  Patient learns about the Pritikin recommended label reading guidelines and corresponding recommendations regarding calorie density, added sugars, sodium content, and whole grains.  Dining Out - Part 1   Clinical staff conducted group or individual video education with verbal and written material and guidebook.  Patient learns that restaurant meals can be sabotaging because they can be so high in calories, fat, sodium, and/or sugar. Patient learns recommended strategies on how to positively address this and avoid unhealthy pitfalls.  Facts on Fats  Clinical staff conducted group or individual video education with verbal and written material and guidebook.  Patient learns that lifestyle modifications can be just as effective, if not more so, as many medications for lowering your risk of heart disease. A Pritikin lifestyle can help to reduce your risk of inflammation and atherosclerosis (cholesterol build-up, or plaque, in the artery walls). Lifestyle interventions such as dietary choices and physical activity address the cause of atherosclerosis. A review of the types of fats and their impact on blood cholesterol levels, along with dietary recommendations to reduce fat intake is also included.  Nutrition Action Plan  Clinical staff conducted group or individual video education with verbal and written material and guidebook.  Patient learns how to incorporate Pritikin recommendations into their lifestyle. Recommendations include planning and keeping personal health goals in mind as an important part of their success.  Healthy Mind-Set    Healthy Minds, Bodies, Hearts  Clinical staff conducted group or individual video education with verbal and written material and guidebook.  Patient learns how to identify  when they are stressed. Video will discuss the impact of that stress, as well as the many benefits of stress management. Patient will also be introduced to stress management techniques. The way we think, act, and feel has an impact on our hearts.  How Our Thoughts Can Heal Our Hearts  Clinical staff conducted group or individual video education with verbal and written material and guidebook.   Patient learns that negative thoughts can cause depression and anxiety. This can result in negative lifestyle behavior and serious health problems. Cognitive behavioral therapy is an effective method to help control our thoughts in order to change and improve our emotional outlook.  Additional Videos:  Exercise    Improving Performance  Clinical staff conducted group or individual video education with verbal and written material and guidebook.  Patient learns to use a non-linear approach by alternating intensity levels and lengths of time spent exercising to help burn more calories and lose more body fat. Cardiovascular exercise helps improve heart health, metabolism, hormonal balance, blood sugar control, and recovery from fatigue. Resistance training improves strength, endurance, balance, coordination, reaction time, metabolism, and muscle mass. Flexibility exercise improves circulation, posture, and balance. Seek guidance from your physician and exercise physiologist before implementing an exercise routine and learn your capabilities and proper form for all exercise.  Introduction to Yoga  Clinical staff conducted group or individual video education with verbal and written material and guidebook.  Patient learns about yoga, a discipline of the coming together of mind, breath, and body. The benefits of yoga include improved flexibility, improved range of motion, better posture and core strength, increased lung function, weight loss, and positive self-image. Yoga's heart health benefits include lowered blood pressure, healthier heart rate, decreased cholesterol and triglyceride levels, improved immune function, and reduced stress. Seek guidance from your physician and exercise physiologist before implementing an exercise routine and learn your capabilities and proper form for all exercise.  Medical   Aging: Enhancing Your Quality of Life  Clinical staff conducted group or individual video education  with verbal and written material and guidebook.  Patient learns key strategies and recommendations to stay in good physical health and enhance quality of life, such as prevention strategies, having an advocate, securing a Health Care Proxy and Power of Attorney, and keeping a list of medications and system for tracking them. It also discusses how to avoid risk for bone loss.  Biology of Weight Control  Clinical staff conducted group or individual video education with verbal and written material and guidebook.  Patient learns that weight gain occurs because we consume more calories than we burn (eating more, moving less). Even if your body weight is normal, you may have higher ratios of fat compared to muscle mass. Too much body fat puts you at increased risk for cardiovascular disease, heart attack, stroke, type 2 diabetes, and obesity-related cancers. In addition to exercise, following the Pritikin Eating Plan can help reduce your risk.  Decoding Lab Results  Clinical staff conducted group or individual video education with verbal and written material and guidebook.  Patient learns that lab test reflects one measurement whose values change over time and are influenced by many factors, including medication, stress, sleep, exercise, food, hydration, pre-existing medical conditions, and more. It is recommended to use the knowledge from this video to become more involved with your lab results and evaluate your numbers to speak with your doctor.   Diseases of Our Time - Overview  Clinical staff conducted group or individual video education  with verbal and written material and guidebook.  Patient learns that according to the CDC, 50% to 70% of chronic diseases (such as obesity, type 2 diabetes, elevated lipids, hypertension, and heart disease) are avoidable through lifestyle improvements including healthier food choices, listening to satiety cues, and increased physical activity.  Sleep  Disorders Clinical staff conducted group or individual video education with verbal and written material and guidebook.  Patient learns how good quality and duration of sleep are important to overall health and well-being. Patient also learns about sleep disorders and how they impact health along with recommendations to address them, including discussing with a physician.  Nutrition  Dining Out - Part 2 Clinical staff conducted group or individual video education with verbal and written material and guidebook.  Patient learns how to plan ahead and communicate in order to maximize their dining experience in a healthy and nutritious manner. Included are recommended food choices based on the type of restaurant the patient is visiting.   Fueling a Banker conducted group or individual video education with verbal and written material and guidebook.  There is a strong connection between our food choices and our health. Diseases like obesity and type 2 diabetes are very prevalent and are in large-part due to lifestyle choices. The Pritikin Eating Plan provides plenty of food and hunger-curbing satisfaction. It is easy to follow, affordable, and helps reduce health risks.  Menu Workshop  Clinical staff conducted group or individual video education with verbal and written material and guidebook.  Patient learns that restaurant meals can sabotage health goals because they are often packed with calories, fat, sodium, and sugar. Recommendations include strategies to plan ahead and to communicate with the manager, chef, or server to help order a healthier meal.  Planning Your Eating Strategy  Clinical staff conducted group or individual video education with verbal and written material and guidebook.  Patient learns about the Pritikin Eating Plan and its benefit of reducing the risk of disease. The Pritikin Eating Plan does not focus on calories. Instead, it emphasizes high-quality,  nutrient-rich foods. By knowing the characteristics of the foods, we choose, we can determine their calorie density and make informed decisions.  Targeting Your Nutrition Priorities  Clinical staff conducted group or individual video education with verbal and written material and guidebook.  Patient learns that lifestyle habits have a tremendous impact on disease risk and progression. This video provides eating and physical activity recommendations based on your personal health goals, such as reducing LDL cholesterol, losing weight, preventing or controlling type 2 diabetes, and reducing high blood pressure.  Vitamins and Minerals  Clinical staff conducted group or individual video education with verbal and written material and guidebook.  Patient learns different ways to obtain key vitamins and minerals, including through a recommended healthy diet. It is important to discuss all supplements you take with your doctor.   Healthy Mind-Set    Smoking Cessation  Clinical staff conducted group or individual video education with verbal and written material and guidebook.  Patient learns that cigarette smoking and tobacco addiction pose a serious health risk which affects millions of people. Stopping smoking will significantly reduce the risk of heart disease, lung disease, and many forms of cancer. Recommended strategies for quitting are covered, including working with your doctor to develop a successful plan.  Culinary   Becoming a Set designer conducted group or individual video education with verbal and written material and guidebook.  Patient learns that cooking  at home can be healthy, cost-effective, quick, and puts them in control. Keys to cooking healthy recipes will include looking at your recipe, assessing your equipment needs, planning ahead, making it simple, choosing cost-effective seasonal ingredients, and limiting the use of added fats, salts, and sugars.  Cooking -  Breakfast and Snacks  Clinical staff conducted group or individual video education with verbal and written material and guidebook.  Patient learns how important breakfast is to satiety and nutrition through the entire day. Recommendations include key foods to eat during breakfast to help stabilize blood sugar levels and to prevent overeating at meals later in the day. Planning ahead is also a key component.  Cooking - Educational psychologist conducted group or individual video education with verbal and written material and guidebook.  Patient learns eating strategies to improve overall health, including an approach to cook more at home. Recommendations include thinking of animal protein as a side on your plate rather than center stage and focusing instead on lower calorie dense options like vegetables, fruits, whole grains, and plant-based proteins, such as beans. Making sauces in large quantities to freeze for later and leaving the skin on your vegetables are also recommended to maximize your experience.  Cooking - Healthy Salads and Dressing Clinical staff conducted group or individual video education with verbal and written material and guidebook.  Patient learns that vegetables, fruits, whole grains, and legumes are the foundations of the Pritikin Eating Plan. Recommendations include how to incorporate each of these in flavorful and healthy salads, and how to create homemade salad dressings. Proper handling of ingredients is also covered. Cooking - Soups and [employer] - Soups and Desserts Clinical staff conducted group or individual video education with verbal and written material and guidebook.  Patient learns that Pritikin soups and desserts make for easy, nutritious, and delicious snacks and meal components that are low in sodium, fat, sugar, and calorie density, while high in vitamins, minerals, and filling fiber. Recommendations include simple and healthy ideas for soups and  desserts.   Overview     The Pritikin Solution Program Overview Clinical staff conducted group or individual video education with verbal and written material and guidebook.  Patient learns that the results of the Pritikin Program have been documented in more than 100 articles published in peer-reviewed journals, and the benefits include reducing risk factors for (and, in some cases, even reversing) high cholesterol, high blood pressure, type 2 diabetes, obesity, and more! An overview of the three key pillars of the Pritikin Program will be covered: eating well, doing regular exercise, and having a healthy mind-set.  WORKSHOPS  Exercise: Exercise Basics: Building Your Action Plan Clinical staff led group instruction and group discussion with PowerPoint presentation and patient guidebook. To enhance the learning environment the use of posters, models and videos may be added. At the conclusion of this workshop, patients will comprehend the difference between physical activity and exercise, as well as the benefits of incorporating both, into their routine. Patients will understand the FITT (Frequency, Intensity, Time, and Type) principle and how to use it to build an exercise action plan. In addition, safety concerns and other considerations for exercise and cardiac rehab will be addressed by the presenter. The purpose of this lesson is to promote a comprehensive and effective weekly exercise routine in order to improve patients' overall level of fitness.   Managing Heart Disease: Your Path to a Healthier Heart Clinical staff led group instruction and group discussion with  PowerPoint presentation and patient guidebook. To enhance the learning environment the use of posters, models and videos may be added.At the conclusion of this workshop, patients will understand the anatomy and physiology of the heart. Additionally, they will understand how Pritikin's three pillars impact the risk factors, the  progression, and the management of heart disease.  The purpose of this lesson is to provide a high-level overview of the heart, heart disease, and how the Pritikin lifestyle positively impacts risk factors.  Exercise Biomechanics Clinical staff led group instruction and group discussion with PowerPoint presentation and patient guidebook. To enhance the learning environment the use of posters, models and videos may be added. Patients will learn how the structural parts of their bodies function and how these functions impact their daily activities, movement, and exercise. Patients will learn how to promote a neutral spine, learn how to manage pain, and identify ways to improve their physical movement in order to promote healthy living. The purpose of this lesson is to expose patients to common physical limitations that impact physical activity. Participants will learn practical ways to adapt and manage aches and pains, and to minimize their effect on regular exercise. Patients will learn how to maintain good posture while sitting, walking, and lifting.  Balance Training and Fall Prevention  Clinical staff led group instruction and group discussion with PowerPoint presentation and patient guidebook. To enhance the learning environment the use of posters, models and videos may be added. At the conclusion of this workshop, patients will understand the importance of their sensorimotor skills (vision, proprioception, and the vestibular system) in maintaining their ability to balance as they age. Patients will apply a variety of balancing exercises that are appropriate for their current level of function. Patients will understand the common causes for poor balance, possible solutions to these problems, and ways to modify their physical environment in order to minimize their fall risk. The purpose of this lesson is to teach patients about the importance of maintaining balance as they age and ways to  minimize their risk of falling.  WORKSHOPS   Nutrition:  Fueling a Ship broker led group instruction and group discussion with PowerPoint presentation and patient guidebook. To enhance the learning environment the use of posters, models and videos may be added. Patients will review the foundational principles of the Pritikin Eating Plan and understand what constitutes a serving size in each of the food groups. Patients will also learn Pritikin-friendly foods that are better choices when away from home and review make-ahead meal and snack options. Calorie density will be reviewed and applied to three nutrition priorities: weight maintenance, weight loss, and weight gain. The purpose of this lesson is to reinforce (in a group setting) the key concepts around what patients are recommended to eat and how to apply these guidelines when away from home by planning and selecting Pritikin-friendly options. Patients will understand how calorie density may be adjusted for different weight management goals.  Mindful Eating  Clinical staff led group instruction and group discussion with PowerPoint presentation and patient guidebook. To enhance the learning environment the use of posters, models and videos may be added. Patients will briefly review the concepts of the Pritikin Eating Plan and the importance of low-calorie dense foods. The concept of mindful eating will be introduced as well as the importance of paying attention to internal hunger signals. Triggers for non-hunger eating and techniques for dealing with triggers will be explored. The purpose of this lesson is to provide patients with  the opportunity to review the basic principles of the Pritikin Eating Plan, discuss the value of eating mindfully and how to measure internal cues of hunger and fullness using the Hunger Scale. Patients will also discuss reasons for non-hunger eating and learn strategies to use for controlling emotional  eating.  Targeting Your Nutrition Priorities Clinical staff led group instruction and group discussion with PowerPoint presentation and patient guidebook. To enhance the learning environment the use of posters, models and videos may be added. Patients will learn how to determine their genetic susceptibility to disease by reviewing their family history. Patients will gain insight into the importance of diet as part of an overall healthy lifestyle in mitigating the impact of genetics and other environmental insults. The purpose of this lesson is to provide patients with the opportunity to assess their personal nutrition priorities by looking at their family history, their own health history and current risk factors. Patients will also be able to discuss ways of prioritizing and modifying the Pritikin Eating Plan for their highest risk areas  Menu  Clinical staff led group instruction and group discussion with PowerPoint presentation and patient guidebook. To enhance the learning environment the use of posters, models and videos may be added. Using menus brought in from [employer], or printed from [employer], patients will apply the Pritikin dining out guidelines that were presented in the [employer] video. Patients will also be able to practice these guidelines in a variety of provided scenarios. The purpose of this lesson is to provide patients with the opportunity to practice hands-on learning of the Pritikin Dining Out guidelines with actual menus and practice scenarios.  Label Reading Clinical staff led group instruction and group discussion with PowerPoint presentation and patient guidebook. To enhance the learning environment the use of posters, models and videos may be added. Patients will review and discuss the [facility] label reading guidelines presented in [employer]'s Label Reading Educational series video. Using fool labels brought in from local grocery stores and  markets, patients will apply the label reading guidelines and determine if the packaged food meet the P[facility]in guidelines. The purpose of this lesson is to provide patients with the opportunity to review, discuss, and practice hands-on learning of the [facility] Label Reading guidelines with actual packaged food labels. Cooking School  [employer]'s [employer] are designed to teach patients ways to prepare quick, simple, and affordable recipes at home. The importance of nutrition's role in chronic disease risk reduction is reflected in its emphasis in the overall [facility] program. By learning how to prepare essential core [employer]in Eating Plan recipes, patients will increase control over what they eat; be able to customize the flavor of foods without the use of added salt, sugar, or fat; and improve the quality of the food they consume. By learning a set of core recipes which are easily assembled, quickly prepared, and affordable, patients are more likely to prepare more healthy foods at home. These workshops focus on convenient breakfasts, simple entres, side dishes, and desserts which can be prepared with minimal effort and are consistent with nutrition recommendations for cardiovascular risk reduction. Cooking [employer] are taught by a Armed forces logistics/support/administrative officer (RD) who has been trained by the [employer]. The chef or RD has a clear understanding of the importance of minimizing - if not completely eliminating - added fat, sugar, and sodium in recipes. Throughout the series of Cooking School Workshop sessions, patients will learn about healthy ingredients and efficient methods  of cooking to build confidence in their capability to prepare    Cooking School weekly topics:  Adding Flavor- Sodium-Free  Fast and Healthy Breakfasts  Powerhouse Plant-Based Proteins  Satisfying Salads and Dressings  Simple Sides and Sauces  International Cuisine-Spotlight on the [employer]  Zones  Delicious Desserts  Savory Soups  [employer] - Meals in a Snap  Tasty Appetizers and Snacks  Comforting Weekend Breakfasts  One-Pot Wonders   Fast Evening Meals  Landscape architect Your Pritikin Plate  WORKSHOPS   Healthy Mindset (Psychosocial):  Focused Goals, Sustainable Changes Clinical staff led group instruction and group discussion with PowerPoint presentation and patient guidebook. To enhance the learning environment the use of posters, models and videos may be added. Patients will be able to apply effective goal setting strategies to establish at least one personal goal, and then take consistent, meaningful action toward that goal. They will learn to identify common barriers to achieving personal goals and develop strategies to overcome them. Patients will also gain an understanding of how our mind-set can impact our ability to achieve goals and the importance of cultivating a positive and growth-oriented mind-set. The purpose of this lesson is to provide patients with a deeper understanding of how to set and achieve personal goals, as well as the tools and strategies needed to overcome common obstacles which may arise along the way.  From Head to Heart: The Power of a Healthy Outlook  Clinical staff led group instruction and group discussion with PowerPoint presentation and patient guidebook. To enhance the learning environment the use of posters, models and videos may be added. Patients will be able to recognize and describe the impact of emotions and mood on physical health. They will discover the importance of self-care and explore self-care practices which may work for them. Patients will also learn how to utilize the 4 C's to cultivate a healthier outlook and better manage stress and challenges. The purpose of this lesson is to demonstrate to patients how a healthy outlook is an essential part of maintaining good health, especially as they continue their  cardiac rehab journey.  Healthy Sleep for a Healthy Heart Clinical staff led group instruction and group discussion with PowerPoint presentation and patient guidebook. To enhance the learning environment the use of posters, models and videos may be added. At the conclusion of this workshop, patients will be able to demonstrate knowledge of the importance of sleep to overall health, well-being, and quality of life. They will understand the symptoms of, and treatments for, common sleep disorders. Patients will also be able to identify daytime and nighttime behaviors which impact sleep, and they will be able to apply these tools to help manage sleep-related challenges. The purpose of this lesson is to provide patients with a general overview of sleep and outline the importance of quality sleep. Patients will learn about a few of the most common sleep disorders. Patients will also be introduced to the concept of "sleep hygiene," and discover ways to self-manage certain sleeping problems through simple daily behavior changes. Finally, the workshop will motivate patients by clarifying the links between quality sleep and their goals of heart-healthy living.   Recognizing and Reducing Stress Clinical staff led group instruction and group discussion with PowerPoint presentation and patient guidebook. To enhance the learning environment the use of posters, models and videos may be added. At the conclusion of this workshop, patients will be able to understand the types of stress reactions, differentiate between acute and chronic stress,  and recognize the impact that chronic stress has on their health. They will also be able to apply different coping mechanisms, such as reframing negative self-talk. Patients will have the opportunity to practice a variety of stress management techniques, such as deep abdominal breathing, progressive muscle relaxation, and/or guided imagery.  The purpose of this lesson is to educate  patients on the role of stress in their lives and to provide healthy techniques for coping with it.  Learning Barriers/Preferences:  Learning Barriers/Preferences - 02/01/23 1059       Learning Barriers/Preferences   Learning Barriers None    Learning Preferences Skilled Demonstration             Education Topics:  Knowledge Questionnaire Score:  Knowledge Questionnaire Score - 05/12/23 0959       Knowledge Questionnaire Score   Post Score 22/24             Core Components/Risk Factors/Patient Goals at Admission:  Personal Goals and Risk Factors at Admission - 02/01/23 1058       Core Components/Risk Factors/Patient Goals on Admission   Lipids Yes    Intervention Provide education and support for participant on nutrition & aerobic/resistive exercise along with prescribed medications to achieve LDL 70mg , HDL >40mg .    Expected Outcomes Short Term: Participant states understanding of desired cholesterol values and is compliant with medications prescribed. Participant is following exercise prescription and nutrition guidelines.;Long Term: Cholesterol controlled with medications as prescribed, with individualized exercise RX and with personalized nutrition plan. Value goals: LDL < 70mg , HDL > 40 mg.    Stress Yes    Intervention Offer individual and/or small group education and counseling on adjustment to heart disease, stress management and health-related lifestyle change. Teach and support self-help strategies.;Refer participants experiencing significant psychosocial distress to appropriate mental health specialists for further evaluation and treatment. When possible, include family members and significant others in education/counseling sessions.    Expected Outcomes Short Term: Participant demonstrates changes in health-related behavior, relaxation and other stress management skills, ability to obtain effective social support, and compliance with psychotropic medications if  prescribed.;Long Term: Emotional wellbeing is indicated by absence of clinically significant psychosocial distress or social isolation.             Core Components/Risk Factors/Patient Goals Review:   Goals and Risk Factor Review     Row Name 02/22/23 1052 03/22/23 1059 04/14/23 1439 05/13/23 1115       Core Components/Risk Factors/Patient Goals Review   Personal Goals Review Weight Management/Obesity;Lipids;Stress Weight Management/Obesity;Lipids;Stress Weight Management/Obesity;Lipids;Stress Weight Management/Obesity;Lipids;Stress    Review Nickola is off to a good start to exercise at cardiac rehab. Vital signs have been stable. Katlan is currently out with an URI Shaindel is doing well with exercise at cardiac rehab. Vital signs have been stable. Myriah continues to do well with exercise at cardiac rehab. Vital signs have been stable. Ameyah has been increasing her workloads Ghislaine continues to do well with exercise at cardiac rehab. Vital signs have been stable. Dorthie has been increasing her workloads. Asyah will complete cardiac rehab on 05/18/23    Expected Outcomes Mckenize will continue to participate in cardiac rehab for exercise, nutrition and lifestyle modifications Jitzel will continue to participate in cardiac rehab for exercise, nutrition and lifestyle modifications Alescia will continue to participate in cardiac rehab for exercise, nutrition and lifestyle modifications Ceren will continue to participate in cardiac rehab for exercise, nutrition and lifestyle modifications  Core Components/Risk Factors/Patient Goals at Discharge (Final Review):   Goals and Risk Factor Review - 05/13/23 1115       Core Components/Risk Factors/Patient Goals Review   Personal Goals Review Weight Management/Obesity;Lipids;Stress    Review Ma continues to do well with exercise at cardiac rehab. Vital signs have been stable. Jatziri has been increasing her workloads. Anabelle will complete cardiac  rehab on 05/18/23    Expected Outcomes Donald will continue to participate in cardiac rehab for exercise, nutrition and lifestyle modifications             ITP Comments:  ITP Comments     Row Name 02/01/23 1024 02/22/23 1030 03/22/23 1056 04/14/23 1436 05/13/23 1113   ITP Comments Medical Director- Dr. Armanda Magic, MD. Introduction to the [facility] Education Program / Intensive Cardiac Rehab. Initial orientation packet reviewed with the patient today. 30 Day ITP Review. Brent is off to a good start to exercise at cardiac rehab. Maryclaire is currently abesnt from cardiac rehab due to a recent URI. 30 Day ITP Review. Kamyra has good attendance and participaiton in  cardiac rehab 30 Day ITP Review. Casundra continues to have  good attendance and participaiton in cardiac rehab. Kenlynn will complete cardiac rehab in the beginning of November. 30 Day ITP Review. Danny continues to have  good attendance and participaiton in cardiac rehab. Sharnese will complete cardiac rehab on 05/18/23..            Comments: See ITP comments.Thayer Headings RN BSN

## 2023-05-16 ENCOUNTER — Encounter (HOSPITAL_COMMUNITY)
Admission: RE | Admit: 2023-05-16 | Discharge: 2023-05-16 | Disposition: A | Discharge status: Home / Self Care | Payer: Medicare Other | Source: Ambulatory Visit | Attending: Cardiology | Admitting: Cardiology

## 2023-05-16 DIAGNOSIS — Z955 Presence of coronary angioplasty implant and graft: Secondary | ICD-10-CM | POA: Diagnosis not present

## 2023-05-16 DIAGNOSIS — Z48812 Encounter for surgical aftercare following surgery on the circulatory system: Secondary | ICD-10-CM | POA: Diagnosis not present

## 2023-05-18 ENCOUNTER — Other Ambulatory Visit: Payer: Self-pay | Admitting: Obstetrics & Gynecology

## 2023-05-18 ENCOUNTER — Encounter (HOSPITAL_COMMUNITY)
Admission: RE | Admit: 2023-05-18 | Discharge: 2023-05-18 | Disposition: A | Discharge status: Home / Self Care | Payer: Medicare Other | Source: Ambulatory Visit | Attending: Cardiology | Admitting: Cardiology

## 2023-05-18 DIAGNOSIS — R9230 Dense breasts, unspecified: Secondary | ICD-10-CM

## 2023-05-18 DIAGNOSIS — Z01419 Encounter for gynecological examination (general) (routine) without abnormal findings: Secondary | ICD-10-CM | POA: Diagnosis not present

## 2023-05-18 DIAGNOSIS — M8580 Other specified disorders of bone density and structure, unspecified site: Secondary | ICD-10-CM

## 2023-05-18 DIAGNOSIS — Z955 Presence of coronary angioplasty implant and graft: Secondary | ICD-10-CM

## 2023-05-18 DIAGNOSIS — Z48812 Encounter for surgical aftercare following surgery on the circulatory system: Secondary | ICD-10-CM | POA: Diagnosis not present

## 2023-05-18 NOTE — Progress Notes (Signed)
Discharge Progress Report  Patient Details  Name: [name] MRN: [id number] Date of Birth: [birth year] Referring Provider:   Flowsheet Row INTENSIVE CARDIAC REHAB ORIENT from 02/01/2023 in [facility]  Referring Provider Arrie Senate, MD ([facility])  Mayford Knife, Cornelious Bryant, MD (coverage)]        Number of Visits: 72  Reason for Discharge:  Patient reached a stable level of exercise. Patient independent in their exercise. Patient has met program and personal goals.  Smoking History:  Social History   Tobacco Use  Smoking Status Never  Smokeless Tobacco Never    Diagnosis:  11/10/22 DES LAD  ADL UCSD:   Initial Exercise Prescription:  Initial Exercise Prescription - 02/01/23 1500       Date of Initial Exercise RX and Referring Provider   Date 02/01/23    Referring Provider Arrie Senate, MD ([facility])   Quintella Reichert, MD (coverage)   Expected Discharge Date 05/02/23      NuStep   Level 1    SPM 75    Minutes 30    METs 2.2      Prescription Details   Frequency (times per week) 3    Duration Progress to 30 minutes of continuous aerobic without signs/symptoms of physical distress      Intensity   THRR 40-80% of Max Heartrate 62-124    Ratings of Perceived Exertion 11-13    Perceived Dyspnea 0-4      Progression   Progression Continue to progress workloads to maintain intensity without signs/symptoms of physical distress.      Resistance Training   Training Prescription Yes    Weight 2 lbs    Reps 10-15             Discharge Exercise Prescription (Final Exercise Prescription Changes):  Exercise Prescription Changes - 05/18/23 1500       Response to Exercise   Blood Pressure (Admit) 122/72    Blood Pressure (Exit) 116/68    Heart Rate (Admit) 75 bpm    Heart Rate (Exercise) 121 bpm    Heart Rate (Exit) 84 bpm    Rating of Perceived Exertion (Exercise) 13    Symptoms None     Comments Pt graduated from the cRP2 program today    Duration Continue with 30 min of aerobic exercise without signs/symptoms of physical distress.    Intensity THRR unchanged      Progression   Progression Continue to progress workloads to maintain intensity without signs/symptoms of physical distress.    Average METs 3.1      Resistance Training   Training Prescription No    Weight No weights on wednesdays      Interval Training   Interval Training No      Recumbant Bike   Level 4    RPM 54    Watts 42    Minutes 15    METs 3.4      Track   Laps 13    Minutes 15    METs 2.66      Home Exercise Plan   Plans to continue exercise at Home (comment)    Frequency Add 2 additional days to program exercise sessions.    Initial Home Exercises Provided 03/21/23             Functional Capacity:  6 Minute Walk     Row Name 02/01/23 1136 05/11/23 0900       6 Minute  Walk   Phase Initial Discharge    Distance 1104 feet 1395 feet    Distance % Change -- 26.36 %    Distance Feet Change -- 291 ft    Walk Time 6 minutes 6 minutes    # of Rest Breaks 0 0    MPH 2.09 2.64    METS 2.93 3.23    RPE 9 11    Perceived Dyspnea  0 0    VO2 Peak 10.25 11.3    Symptoms Yes (comment) No    Comments Chronic right knee pain at end of walk test, which she rates 2-3 out of 10 on the pain scale. --    Resting HR 62 bpm 89 bpm    Resting BP 122/70 106/78    Resting Oxygen Saturation  97 % --    Exercise Oxygen Saturation  during 6 min walk 97 % --    Max Ex. HR 98 bpm 111 bpm    Max Ex. BP 158/60 124/68    2 Minute Post BP 120/70 --             Psychological, QOL, Others - Outcomes: PHQ 2/9:    05/18/2023    9:36 AM 02/01/2023    3:20 PM 01/10/2023    1:53 PM 10/28/2022   11:26 AM 07/26/2022    2:31 PM  Depression screen PHQ 2/9  Decreased Interest 0 0 0 0 0  Down, Depressed, Hopeless 0 0 0 0 1  PHQ - 2 Score 0 0 0 0 1  Altered sleeping 1 1  0 0  Tired, decreased energy  0 1  0 0  Change in appetite 1 0  0 0  Feeling bad or failure about yourself  0 0  0 0  Trouble concentrating 0 0  0 0  Moving slowly or fidgety/restless 0 0  0 0  Suicidal thoughts 0 0  0 0  PHQ-9 Score 2 2  0 1  Difficult doing work/chores Not difficult at all Somewhat difficult  Not difficult at all Not difficult at all    Quality of Life:  Quality of Life - 05/12/23 0959       Quality of Life Scores   Health/Function Pre 21.3 %    Health/Function Post 23.53 %    Health/Function % Change 10.47 %    Socioeconomic Pre 24.2 %    Socioeconomic Post 26.36 %    Socioeconomic % Change  8.93 %    Psych/Spiritual Pre 22.93 %    Psych/Spiritual Post 24.64 %    Psych/Spiritual % Change 7.46 %    Family Pre 20.1 %    Family Post 23.5 %    Family % Change 16.92 %    GLOBAL Pre 21.84 %    GLOBAL Post 24.34 %    GLOBAL % Change 11.45 %             Personal Goals: Goals established at orientation with interventions provided to work toward goal.  Personal Goals and Risk Factors at Admission - 02/01/23 1058       Core Components/Risk Factors/Patient Goals on Admission   Lipids Yes    Intervention Provide education and support for participant on nutrition & aerobic/resistive exercise along with prescribed medications to achieve LDL 70mg , HDL >40mg .    Expected Outcomes Short Term: Participant states understanding of desired cholesterol values and is compliant with medications prescribed. Participant is following exercise prescription and nutrition guidelines.;Long Term: Cholesterol controlled with  medications as prescribed, with individualized exercise RX and with personalized nutrition plan. Value goals: LDL < 70mg , HDL > 40 mg.    Stress Yes    Intervention Offer individual and/or small group education and counseling on adjustment to heart disease, stress management and health-related lifestyle change. Teach and support self-help strategies.;Refer participants experiencing  significant psychosocial distress to appropriate mental health specialists for further evaluation and treatment. When possible, include family members and significant others in education/counseling sessions.    Expected Outcomes Short Term: Participant demonstrates changes in health-related behavior, relaxation and other stress management skills, ability to obtain effective social support, and compliance with psychotropic medications if prescribed.;Long Term: Emotional wellbeing is indicated by absence of clinically significant psychosocial distress or social isolation.              Personal Goals Discharge:  Goals and Risk Factor Review     Row Name 02/22/23 1052 03/22/23 1059 04/14/23 1439 05/13/23 1115       Core Components/Risk Factors/Patient Goals Review   Personal Goals Review Weight Management/Obesity;Lipids;Stress Weight Management/Obesity;Lipids;Stress Weight Management/Obesity;Lipids;Stress Weight Management/Obesity;Lipids;Stress    Review Rogelio is off to a good start to exercise at cardiac rehab. Vital signs have been stable. Jaiona is currently out with an URI Akaia is doing well with exercise at cardiac rehab. Vital signs have been stable. Maezie continues to do well with exercise at cardiac rehab. Vital signs have been stable. Jatana has been increasing her workloads Shelonda continues to do well with exercise at cardiac rehab. Vital signs have been stable. Sheralee has been increasing her workloads. Aanshi will complete cardiac rehab on 05/18/23    Expected Outcomes Theadora will continue to participate in cardiac rehab for exercise, nutrition and lifestyle modifications Jurnei will continue to participate in cardiac rehab for exercise, nutrition and lifestyle modifications Calista will continue to participate in cardiac rehab for exercise, nutrition and lifestyle modifications Sharyl will continue to participate in cardiac rehab for exercise, nutrition and lifestyle modifications              Exercise Goals and Review:  Exercise Goals     Row Name 02/01/23 1058             Exercise Goals   Increase Physical Activity Yes       Intervention Provide advice, education, support and counseling about physical activity/exercise needs.;Develop an individualized exercise prescription for aerobic and resistive training based on initial evaluation findings, risk stratification, comorbidities and participant's personal goals.       Expected Outcomes Short Term: Attend rehab on a regular basis to increase amount of physical activity.;Long Term: Add in home exercise to make exercise part of routine and to increase amount of physical activity.;Long Term: Exercising regularly at least 3-5 days a week.       Increase Strength and Stamina Yes       Intervention Provide advice, education, support and counseling about physical activity/exercise needs.;Develop an individualized exercise prescription for aerobic and resistive training based on initial evaluation findings, risk stratification, comorbidities and participant's personal goals.       Expected Outcomes Short Term: Increase workloads from initial exercise prescription for resistance, speed, and METs.;Short Term: Perform resistance training exercises routinely during rehab and add in resistance training at home;Long Term: Improve cardiorespiratory fitness, muscular endurance and strength as measured by increased METs and functional capacity ( )       Able to understand and use rate of perceived exertion (RPE) scale Yes  Intervention Provide education and explanation on how to use RPE scale       Expected Outcomes Short Term: Able to use RPE daily in rehab to express subjective intensity level;Long Term:  Able to use RPE to guide intensity level when exercising independently       Knowledge and understanding of Target Heart Rate Range (THRR) Yes       Intervention Provide education and explanation of THRR including how the numbers were  predicted and where they are located for reference       Expected Outcomes Short Term: Able to state/look up THRR;Long Term: Able to use THRR to govern intensity when exercising independently;Short Term: Able to use daily as guideline for intensity in rehab       Able to check pulse independently Yes       Intervention Provide education and demonstration on how to check pulse in carotid and radial arteries.;Review the importance of being able to check your own pulse for safety during independent exercise       Expected Outcomes Short Term: Able to explain why pulse checking is important during independent exercise;Long Term: Able to check pulse independently and accurately       Understanding of Exercise Prescription Yes       Intervention Provide education, explanation, and written materials on patient's individual exercise prescription       Expected Outcomes Short Term: Able to explain program exercise prescription;Long Term: Able to explain home exercise prescription to exercise independently                Exercise Goals Re-Evaluation:  Exercise Goals Re-Evaluation     Row Name 02/07/23 1424 03/02/23 1137 03/16/23 1436 03/21/23 1050 04/27/23 1359     Exercise Goal Re-Evaluation   Exercise Goals Review Increase Physical Activity;Understanding of Exercise Prescription;Increase Strength and Stamina;Knowledge and understanding of Target Heart Rate Range (THRR);Able to understand and use rate of perceived exertion (RPE) scale Increase Physical Activity;Understanding of Exercise Prescription;Increase Strength and Stamina;Knowledge and understanding of Target Heart Rate Range (THRR);Able to understand and use rate of perceived exertion (RPE) scale Increase Physical Activity;Understanding of Exercise Prescription;Increase Strength and Stamina;Knowledge and understanding of Target Heart Rate Range (THRR);Able to check pulse independently;Able to understand and use rate of perceived exertion (RPE)  scale Increase Physical Activity;Understanding of Exercise Prescription;Increase Strength and Stamina;Knowledge and understanding of Target Heart Rate Range (THRR);Able to check pulse independently;Able to understand and use rate of perceived exertion (RPE) scale Increase Physical Activity;Understanding of Exercise Prescription;Increase Strength and Stamina;Knowledge and understanding of Target Heart Rate Range (THRR);Able to check pulse independently;Able to understand and use rate of perceived exertion (RPE) scale   Comments Pt' first day in the CRP2 program. Pt understands the Exercise Rx, RPE scale, and THRR. Reviewed METs and goals. Pt voices progress on her goals of imrpoved conditioning and getting back into regular exercise rouitne. Reviewed METs and goals today. Pt is tolerating an avg MET level of 2.95 which is improvement from last review. Pt feels good about her progress and her current exercise routine, but her goal is to resume swimming. Discussed how to check pulse at home since she didn't know how. Discussed home ExRx with pt. Pt is currenty walking outside/ inside 5-7 days/wk for 20 min/day. I encouraged Khylie to increase her time to 30 min/day. She agreed with my recommendations. Pt also mentioned wanting to get back into swimming/ water aerobics. I encouraged to do so and discussed safetly guidelines. Also discussed safetly guidelines  for exercising outside. Marg voiced understanding. Reviewed METs and goals. Peak METs to date are 4.4. Pt is back into a regualr exercise routine, which is a goal. Pt also feels that her overall conditioning is improving. Pt is walking at home 30 min/day.   Expected Outcomes Will continue to monitor patient and progress exercise workloads as tolerated. Will continue to monitor patient and progress exercise workloads as tolerated. Will continue to progress as tolerated. Will continue to progress as tolerated. Will continue to progress as tolerated.    Row Name  05/18/23 1507             Exercise Goal Re-Evaluation   Exercise Goals Review Increase Physical Activity;Understanding of Exercise Prescription;Increase Strength and Stamina;Knowledge and understanding of Target Heart Rate Range (THRR);Able to check pulse independently;Able to understand and use rate of perceived exertion (RPE) scale       Comments Pt graduated from the CRP2 program today.  Peak METs to date are 5.1. Pt is back into a regular exercise routine, which was a goal. Pt also feels her overall conditioning has improved Pt is walking at home 30 min/day. Pt plans to join the [employer] to continue her exercise.       Expected Outcomes Pt will continue to exercise at home/gym.                Nutrition & Weight - Outcomes:  Pre Biometrics - 02/01/23 1024       Pre Biometrics   Waist Circumference 40 inches    Hip Circumference 46.25 inches    Waist to Hip Ratio 0.86 %    Triceps Skinfold 18 mm    % Body Fat 38.4 %    Grip Strength 20 kg    Flexibility --   Not performed, right hip replacement   Single Leg Stand 2.75 seconds             Post Biometrics - 05/11/23 0910        Post  Biometrics   Height 5\' 5"  (1.651 m)    Weight 78.5 kg    Waist Circumference 40.25 inches    Hip Circumference 47 inches    Waist to Hip Ratio 0.86 %    BMI (Calculated) 28.8    Triceps Skinfold 18 mm    % Body Fat 39.9 %    Grip Strength 24 kg    Flexibility --   not done, rt hip replacement   Single Leg Stand 4.5 seconds             Nutrition:  Nutrition Therapy & Goals - 05/09/23 1123       Nutrition Therapy   Diet Heart Healthy Diet      Personal Nutrition Goals   Nutrition Goal Patient to identify strategies for reducing cardiovascular risk by attending the [facility] education and nutrition series weekly.   goal in action.   Personal Goal #2 Patient to improve diet quality by using the plate method as a guide for meal planning to include lean protein/plant  protein, fruits, vegetables, whole grains, nonfat dairy as part of a well-balanced diet.   goal in action.   Comments Goals in action. Eriqa continues to attend the [facility] education and nutrition series reguarly. She has improved understanding of benefits of increased fiber intake, saturated fat recommendations, and reading food labels for sodium/sugar. She reports prioritizing high fiber foods and lean proteins in her diet. She is unable to take statin medications; did discuss Repatha and  Leqvio at cardiology follow-up on 04/04/23 but she declined at this time. Cholesterol and LDL remain elevated. Her A1c remains in a prediabetic range. She has maintained her weight since starting with our program.  Patient will benefit from participation in intensive cardiac rehab for nutrition, exercise, and lifestyle modification.      Intervention Plan   Intervention Prescribe, educate and counsel regarding individualized specific dietary modifications aiming towards targeted core components such as weight, hypertension, lipid management, diabetes, heart failure and other comorbidities.;Nutrition handout(s) given to patient.    Expected Outcomes Short Term Goal: Understand basic principles of dietary content, such as calories, fat, sodium, cholesterol and nutrients.;Long Term Goal: Adherence to prescribed nutrition plan.             Nutrition Discharge:  Nutrition Assessments - 05/18/23 0931       Rate Your Plate Scores   Pre Score 79    Post Score 81             Education Questionnaire Score:  Knowledge Questionnaire Score - 05/12/23 0959       Knowledge Questionnaire Score   Post Score 22/24             Goals reviewed with patient; copy given to patient.Pt graduates from  Intensive/Traditional cardiac rehab program on 05/18/23.  with completion of  36 exercise and  36 education sessions. Pt maintained good attendance and progressed nicely during their participation in rehab as  evidenced by increased MET level. Alayah increased her distance on her post exercise walk test by 291 feet.  Medication list reconciled. Repeat  PHQ score- 2 .  Pt has made significant lifestyle changes and should be commended for their success.  Olamae achieved their goals during cardiac rehab. Cameo reports that she has more strength and endurance.  Pt plans to continue exercise at [employer] We are proud of Quinnlyn's progress!Thayer Headings RN BSN

## 2023-06-01 ENCOUNTER — Ambulatory Visit: Payer: Medicare Other

## 2023-06-13 ENCOUNTER — Other Ambulatory Visit: Payer: Self-pay | Admitting: Family Medicine

## 2023-06-14 ENCOUNTER — Ambulatory Visit
Admission: RE | Admit: 2023-06-14 | Discharge: 2023-06-14 | Disposition: A | Discharge status: Home / Self Care | Payer: Medicare Other | Source: Ambulatory Visit | Attending: Obstetrics & Gynecology | Admitting: Obstetrics & Gynecology

## 2023-06-14 DIAGNOSIS — Z1231 Encounter for screening mammogram for malignant neoplasm of breast: Secondary | ICD-10-CM | POA: Diagnosis not present

## 2023-06-21 ENCOUNTER — Ambulatory Visit: Payer: Medicare Other | Admitting: Family Medicine

## 2023-07-27 DIAGNOSIS — M13842 Other specified arthritis, left hand: Secondary | ICD-10-CM | POA: Diagnosis not present

## 2023-07-28 ENCOUNTER — Encounter: Payer: Self-pay | Admitting: Family Medicine

## 2023-07-28 ENCOUNTER — Other Ambulatory Visit: Payer: Self-pay | Admitting: Family Medicine

## 2023-07-28 MED ORDER — DICYCLOMINE HCL 20 MG PO TABS: 20.0000 mg | ORAL_TABLET | Freq: Two times a day (BID) | ORAL | 1 refills | Status: AC

## 2023-07-29 ENCOUNTER — Telehealth: Payer: Self-pay

## 2023-07-29 NOTE — Telephone Encounter (Signed)
PA initiated via Covermymeds; KEY: [id number]. Awaiting determination.

## 2023-07-29 NOTE — Telephone Encounter (Signed)
Further questions received via fax. Completed and faxed back to [facility]

## 2023-07-29 NOTE — Telephone Encounter (Signed)
PA approved.   Joana Reamer T   07/29/2023 11:47 AM  Crystal "[employer] [place]" [phone] Option 5  Called in regards to the patients medication, states that the dicyclomine (BENTYL) 20 MG tablet was approved by the insurance and the effective date is today, 07/29/23 and it is good for a year from today.

## 2023-08-16 DIAGNOSIS — D225 Melanocytic nevi of trunk: Secondary | ICD-10-CM | POA: Diagnosis not present

## 2023-08-16 DIAGNOSIS — Z1283 Encounter for screening for malignant neoplasm of skin: Secondary | ICD-10-CM | POA: Diagnosis not present

## 2023-08-16 DIAGNOSIS — Z08 Encounter for follow-up examination after completed treatment for malignant neoplasm: Secondary | ICD-10-CM | POA: Diagnosis not present

## 2023-08-16 DIAGNOSIS — Z85820 Personal history of malignant melanoma of skin: Secondary | ICD-10-CM | POA: Diagnosis not present

## 2023-08-25 ENCOUNTER — Other Ambulatory Visit: Payer: Self-pay | Admitting: Family Medicine

## 2023-08-28 NOTE — Assessment & Plan Note (Signed)
 [id number] acceptable, minimize simple carbs. Increase exercise as tolerated.

## 2023-08-28 NOTE — Assessment & Plan Note (Signed)
 Encourage heart healthy diet such as MIND or DASH diet, increase exercise, avoid trans fats, simple carbohydrates and processed foods, consider a krill or fish or flaxseed oil cap daily.

## 2023-08-28 NOTE — Assessment & Plan Note (Signed)
 Hydrate and monitor

## 2023-08-28 NOTE — Assessment & Plan Note (Signed)
 Supplement and monitor

## 2023-08-28 NOTE — Assessment & Plan Note (Signed)
 Patient has tried and been unable to tolerate multiple statins including Simvastatin, Atorvastatin, Rosuvastatin

## 2023-08-28 NOTE — Assessment & Plan Note (Signed)
 Has finished cardiac rehab and is managing risk factors well

## 2023-08-28 NOTE — Assessment & Plan Note (Signed)
 Improving slowly continue to follow with pulmonology and cardiology

## 2023-08-29 ENCOUNTER — Ambulatory Visit: Payer: Medicare Other | Admitting: Family Medicine

## 2023-08-29 ENCOUNTER — Ambulatory Visit (HOSPITAL_BASED_OUTPATIENT_CLINIC_OR_DEPARTMENT_OTHER)
Admission: RE | Admit: 2023-08-29 | Discharge: 2023-08-29 | Disposition: A | Discharge status: Home / Self Care | Source: Ambulatory Visit | Attending: Family Medicine | Admitting: Family Medicine

## 2023-08-29 VITALS — BP 148/71 | HR 86 | Temp 98.3°F | Resp 18 | Ht 65.0 in | Wt 170.2 lb

## 2023-08-29 DIAGNOSIS — R0609 Other forms of dyspnea: Secondary | ICD-10-CM

## 2023-08-29 DIAGNOSIS — N189 Chronic kidney disease, unspecified: Secondary | ICD-10-CM | POA: Diagnosis not present

## 2023-08-29 DIAGNOSIS — R1012 Left upper quadrant pain: Secondary | ICD-10-CM

## 2023-08-29 DIAGNOSIS — E785 Hyperlipidemia, unspecified: Secondary | ICD-10-CM

## 2023-08-29 DIAGNOSIS — E559 Vitamin D deficiency, unspecified: Secondary | ICD-10-CM | POA: Diagnosis not present

## 2023-08-29 DIAGNOSIS — I700 Atherosclerosis of aorta: Secondary | ICD-10-CM

## 2023-08-29 DIAGNOSIS — Z96641 Presence of right artificial hip joint: Secondary | ICD-10-CM | POA: Diagnosis not present

## 2023-08-29 DIAGNOSIS — R739 Hyperglycemia, unspecified: Secondary | ICD-10-CM | POA: Diagnosis not present

## 2023-08-29 DIAGNOSIS — T466X5A Adverse effect of antihyperlipidemic and antiarteriosclerotic drugs, initial encounter: Secondary | ICD-10-CM

## 2023-08-29 DIAGNOSIS — G720 Drug-induced myopathy: Secondary | ICD-10-CM

## 2023-08-29 LAB — TSH: TSH: 1.96 u[IU]/mL (ref 0.35–5.50)

## 2023-08-29 LAB — CBC WITH DIFFERENTIAL/PLATELET
Basophils Absolute: 0.1 10*3/uL (ref 0.0–0.1)
Basophils Relative: 0.9 % (ref 0.0–3.0)
Eosinophils Absolute: 0 10*3/uL (ref 0.0–0.7)
Eosinophils Relative: 0.5 % (ref 0.0–5.0)
HCT: 46.5 % — ABNORMAL HIGH (ref 36.0–46.0)
Hemoglobin: 15.3 g/dL — ABNORMAL HIGH (ref 12.0–15.0)
Lymphocytes Relative: 21 % (ref 12.0–46.0)
Lymphs Abs: 1.7 10*3/uL (ref 0.7–4.0)
MCHC: 32.8 g/dL (ref 30.0–36.0)
MCV: 96.8 fl (ref 78.0–100.0)
Monocytes Absolute: 0.6 10*3/uL (ref 0.1–1.0)
Monocytes Relative: 7.4 % (ref 3.0–12.0)
Neutro Abs: 5.8 10*3/uL (ref 1.4–7.7)
Neutrophils Relative %: 70.2 % (ref 43.0–77.0)
Platelets: 260 10*3/uL (ref 150.0–400.0)
RBC: 4.81 Mil/uL (ref 3.87–5.11)
RDW: 14 % (ref 11.5–15.5)
WBC: 8.3 10*3/uL (ref 4.0–10.5)

## 2023-08-29 LAB — VITAMIN D 25 HYDROXY (VIT D DEFICIENCY, FRACTURES): VITD: 32.24 ng/mL (ref 30.00–100.00)

## 2023-08-29 LAB — HEMOGLOBIN A1C: Hgb A1c MFr Bld: 6.1 % (ref 4.6–6.5)

## 2023-08-29 NOTE — Patient Instructions (Signed)
 Diet for Irritable Bowel Syndrome When you have irritable bowel syndrome (IBS), it is very important to follow the eating habits that are best for your condition. IBS may cause various symptoms, such as pain in the abdomen, constipation, or diarrhea. Choosing the right foods can help to ease the discomfort from these symptoms. Work with your health care provider and dietitian to find the eating plan that will help to control your symptoms. What are tips for following this plan?  Keep a food diary. This will help you identify foods that cause symptoms. Write down: What you eat and when you eat it. What symptoms you have. When symptoms occur in relation to your meals, such as "pain in abdomen 2 hours after dinner." Eat your meals slowly and in a relaxed setting. Aim to eat 5-6 small meals per day. Do not skip meals. Drink enough fluid to keep your urine pale yellow. Ask your health care provider if you should take an over-the-counter probiotic to help restore healthy bacteria in your gut (digestive tract). Probiotics are foods that contain good bacteria and yeasts. Your dietitian may have specific dietary recommendations for you based on your symptoms. Your dietitian may recommend that you: Avoid foods that cause symptoms. Talk with your dietitian about other ways to get the same nutrients that are in those problem foods. Avoid foods with gluten. Gluten is a protein that is found in rye, wheat, and barley. Eat more foods that contain soluble fiber. Examples of foods with high soluble fiber include oats, seeds, and certain fruits and vegetables. Take a fiber supplement if told by your dietitian. Reduce or avoid certain foods called FODMAPs. These are foods that contain sugars that are hard for some people to digest. Ask your health care provider which foods to avoid. What foods should I avoid? The following are some foods and drinks that may make your symptoms worse: Fatty foods, such as french  fries. Foods that contain gluten, such as pasta and cereal. Dairy products, such as milk, cheese, and ice cream. Spicy foods. Alcohol. Products with caffeine, such as coffee, tea, or chocolate. Carbonated drinks, such as soda. Foods that are high in FODMAPs. These include certain fruits and vegetables. Products with sweeteners such as honey, high fructose corn syrup, sorbitol, and mannitol. The items listed above may not be a complete list of foods and beverages you should avoid. Contact a dietitian for more information. What foods are good sources of fiber? Your health care provider or dietitian may recommend that you eat more foods that contain fiber. Fiber can help to reduce constipation and other IBS symptoms. Add foods with fiber to your diet a little at a time so your body can get used to them. Too much fiber at one time might cause gas and swelling of your abdomen. The following are some foods that are good sources of fiber: Berries, such as raspberries, strawberries, and blueberries. Tomatoes. Carrots. Brown rice. Oats. Seeds, such as chia and pumpkin seeds. The items listed above may not be a complete list of recommended sources of fiber. Contact your dietitian for more options. Where to find more information International Foundation for Functional Gastrointestinal Disorders: aboutibs.[employer] of Diabetes and Digestive and Kidney Diseases: [employer]Sync.si Summary When you have irritable bowel syndrome (IBS), it is very important to follow the eating habits that are best for your condition. IBS may cause various symptoms, such as pain in the abdomen, constipation, or diarrhea. Choosing the right foods can help to ease the  discomfort that comes from symptoms. Your health care provider or dietitian may recommend that you eat more foods that contain fiber. Keep a food diary. This will help you identify foods that cause symptoms. This information is not intended to replace  advice given to you by your health care provider. Make sure you discuss any questions you have with your health care provider. Document Revised: 05/26/2021 Document Reviewed: 05/26/2021 Elsevier Patient Education  2024 [employer].
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
## 2023-08-30 ENCOUNTER — Encounter: Payer: Self-pay | Admitting: Family Medicine

## 2023-08-30 ENCOUNTER — Ambulatory Visit: Payer: Medicare Other | Admitting: Family Medicine

## 2023-08-30 LAB — LIPID PANEL
Cholesterol: 213 mg/dL — ABNORMAL HIGH (ref 0–200)
HDL: 62.9 mg/dL (ref >39.00)
LDL Cholesterol: 133 mg/dL — ABNORMAL HIGH (ref 0–99)
NonHDL: 150.28
Total CHOL/HDL Ratio: 3
Triglycerides: 85 mg/dL (ref 0.0–149.0)
VLDL: 17 mg/dL (ref 0.0–40.0)

## 2023-08-30 LAB — COMPREHENSIVE METABOLIC PANEL
ALT: 19 U/L (ref 0–35)
AST: 22 U/L (ref 0–37)
Albumin: 4.3 g/dL (ref 3.5–5.2)
Alkaline Phosphatase: 103 U/L (ref 39–117)
BUN: 21 mg/dL (ref 6–23)
CO2: 27 meq/L (ref 19–32)
Calcium: 9.4 mg/dL (ref 8.4–10.5)
Chloride: 102 meq/L (ref 96–112)
Creatinine, Ser: 0.91 mg/dL (ref 0.40–1.20)
GFR: 65.78 mL/min (ref >60.00)
Glucose, Bld: 90 mg/dL (ref 70–99)
Potassium: 4.1 meq/L (ref 3.5–5.1)
Sodium: 140 meq/L (ref 135–145)
Total Bilirubin: 0.6 mg/dL (ref 0.2–1.2)
Total Protein: 7 g/dL (ref 6.0–8.3)

## 2023-08-30 LAB — AMYLASE: Amylase: 41 U/L (ref 27–131)

## 2023-08-30 LAB — LIPASE: Lipase: 16 U/L (ref 11.0–59.0)

## 2023-09-05 ENCOUNTER — Ambulatory Visit (INDEPENDENT_AMBULATORY_CARE_PROVIDER_SITE_OTHER): Admitting: Physician Assistant

## 2023-09-05 ENCOUNTER — Encounter: Payer: Self-pay | Admitting: Physician Assistant

## 2023-09-05 VITALS — BP 129/78 | HR 73 | Ht 65.0 in | Wt 173.2 lb

## 2023-09-05 DIAGNOSIS — N3000 Acute cystitis without hematuria: Secondary | ICD-10-CM | POA: Diagnosis not present

## 2023-09-05 DIAGNOSIS — R300 Dysuria: Secondary | ICD-10-CM

## 2023-09-05 LAB — POC URINALSYSI DIPSTICK (AUTOMATED)
Bilirubin, UA: NEGATIVE
Blood, UA: NEGATIVE
Glucose, UA: NEGATIVE
Ketones, UA: NEGATIVE
Nitrite, UA: NEGATIVE
Protein, UA: NEGATIVE
Spec Grav, UA: <=1.005 — AB (ref 1.010–1.025)
Urobilinogen, UA: 0.2 U/dL
pH, UA: 5 (ref 5.0–8.0)

## 2023-09-05 MED ORDER — NITROFURANTOIN MONOHYD MACRO 100 MG PO CAPS: 100.0000 mg | ORAL_CAPSULE | Freq: Two times a day (BID) | ORAL | 0 refills | Status: DC

## 2023-09-05 MED ORDER — PHENAZOPYRIDINE HCL 100 MG PO TABS: 100.0000 mg | ORAL_TABLET | Freq: Three times a day (TID) | ORAL | 0 refills | Status: DC | PRN

## 2023-09-05 NOTE — Progress Notes (Signed)
 Established patient visit   Patient: [name]   DOB: 1955-12-29   68 y.o. Female  MRN: [id number] Visit Date: 09/05/2023  Today's healthcare provider: Alfredia Ferguson, PA-C   Cc. Abdominal pressure, hematuria  Subjective     Pt reports lower abdominal pressure, urinary frequency, nausea, hematuria x 1 episode, and right sided lower back pain x 3 days. Denies hematuria today.  Medications: Outpatient Medications Prior to Visit  Medication Sig   albuterol (VENTOLIN HFA) 108 (90 Base) MCG/ACT inhaler Inhale 2 puffs into the lungs every 6 (six) hours as needed for wheezing or shortness of breath.   Ascorbic Acid (VITAMIN C) 1000 MG tablet Take 1,000 mg by mouth daily.    aspirin EC 81 MG tablet Take 81 mg by mouth daily. Swallow whole.   azelastine (ASTELIN) 0.1 % nasal spray Place 2 sprays into both nostrils 2 (two) times daily. Use in each nostril as directed   buPROPion (WELLBUTRIN XL) 150 MG 24 hr tablet Take 1 tablet (150 mg total) by mouth daily.   Cholecalciferol (VITAMIN D) 50 MCG (2000 UT) tablet Take 2,000 Units by mouth daily.   clopidogrel (PLAVIX) 75 MG tablet Take 75 mg by mouth daily.   dicyclomine (BENTYL) 20 MG tablet Take 1 tablet (20 mg total) by mouth 2 (two) times daily.   fluticasone (FLONASE) 50 MCG/ACT nasal spray Place 2 sprays into both nostrils daily.   furosemide (LASIX) 40 MG tablet Take 1 tablet (40 mg total) by mouth daily as needed.   hydrOXYzine (ATARAX) 10 MG tablet TAKE 1 TABLET BY MOUTH THREE TIMES A DAY AS NEEDED   levothyroxine (SYNTHROID) 75 MCG tablet TAKE 1 TABLET BY MOUTH DAILY BEFORE BREAKFAST   loratadine (CLARITIN) 10 MG tablet Take 10 mg by mouth daily.   neomycin-polymyxin-hydrocortisone (CORTISPORIN) OTIC solution Place 3 drops into both ears 3 (three) times daily.   nitroGLYCERIN (NITROSTAT) 0.4 MG SL tablet Place 1 tablet (0.4 mg total) under the tongue every 5 (five) minutes as needed for chest pain.   ondansetron  (ZOFRAN-ODT) 8 MG disintegrating tablet DISSOLVE ONE TABLET BY MOUTH EVERY 8 HOURS AS NEEDED FOR NAUSEA/ VOMITING   traZODone (DESYREL) 50 MG tablet Take 0.5-1 tablets (25-50 mg total) by mouth at bedtime as needed for sleep.   triamcinolone cream (KENALOG) 0.1 % Apply 1 Application topically 2 (two) times daily.   vitamin E 180 MG (400 UNITS) capsule Take by mouth.   Vitamins-Lipotropics (BALANCED B-50) TABS Take by mouth.   No facility-administered medications prior to visit.    Review of Systems  Constitutional:  Negative for fatigue and fever.  Respiratory:  Negative for cough and shortness of breath.   Cardiovascular:  Negative for chest pain and leg swelling.  Gastrointestinal:  Negative for abdominal pain.  Genitourinary:  Positive for frequency, hematuria and urgency.  Neurological:  Negative for dizziness and headaches.       Objective    BP 129/78   Pulse 73   Ht 5\' 5"  (1.651 m)   Wt 173 lb 3.2 oz (78.6 kg)   BMI 28.82 kg/m    Physical Exam Constitutional:      General: She is awake.     Appearance: She is well-developed.  HENT:     Head: Normocephalic.  Eyes:     Conjunctiva/sclera: Conjunctivae normal.  Cardiovascular:     Rate and Rhythm: Normal rate and regular rhythm.     Heart sounds: Normal heart sounds.  Pulmonary:  Effort: Pulmonary effort is normal.     Breath sounds: Normal breath sounds.  Abdominal:     Tenderness: There is no abdominal tenderness. There is no right CVA tenderness or left CVA tenderness.  Skin:    General: Skin is warm.  Neurological:     Mental Status: She is alert and oriented to person, place, and time.  Psychiatric:        Attention and Perception: Attention normal.        Mood and Affect: Mood normal.        Speech: Speech normal.        Behavior: Behavior is cooperative.      Results for orders placed or performed in visit on 09/05/23  POCT Urinalysis Dipstick (Automated)  Result Value Ref Range   Color, UA  yellow    Clarity, UA clear    Glucose, UA Negative Negative   Bilirubin, UA neg    Ketones, UA neg    Spec Grav, UA <=1.005 (A) 1.010 - 1.025   Blood, UA neg    pH, UA 5.0 5.0 - 8.0   Protein, UA Negative Negative   Urobilinogen, UA 0.2 0.2 or 1.0 E.U./dL   Nitrite, UA neg    Leukocytes, UA Trace (A) Negative    Assessment & Plan    Dysuria -     POCT Urinalysis Dipstick (Automated)  Acute cystitis without hematuria -     Nitrofurantoin Monohyd Macro; Take 1 capsule (100 mg total) by mouth 2 (two) times daily.  Dispense: 10 capsule; Refill: 0 -     Phenazopyridine HCl; Take 1 tablet (100 mg total) by mouth 3 (three) times daily as needed for pain.  Dispense: 15 tablet; Refill: 0 -     Urine Culture   UA with trace leuks-- given sxs will tx, reviewed cmp kidney fx appropriate, rx macrobid bid x 5 days Sending for culture.  Pt also deals w/ chronic constipation, advised mirlax and stool softeners  No follow-ups on file.       Alfredia Ferguson, PA-C  [facility] at [facility] [phone] (phone) [phone] (fax)  [facility]

## 2023-09-06 LAB — URINE CULTURE
MICRO NUMBER:: 16180646
Result:: NO GROWTH
SPECIMEN QUALITY:: ADEQUATE

## 2023-09-07 ENCOUNTER — Encounter: Payer: Self-pay | Admitting: Physician Assistant

## 2023-09-08 ENCOUNTER — Encounter: Payer: Self-pay | Admitting: Family Medicine

## 2023-09-08 DIAGNOSIS — K080 Exfoliation of teeth due to systemic causes: Secondary | ICD-10-CM | POA: Diagnosis not present

## 2023-09-13 ENCOUNTER — Other Ambulatory Visit: Payer: Self-pay | Admitting: Family Medicine

## 2023-09-16 ENCOUNTER — Encounter: Payer: Self-pay | Admitting: Obstetrics & Gynecology

## 2023-09-19 ENCOUNTER — Other Ambulatory Visit: Payer: Medicare Other

## 2023-10-05 ENCOUNTER — Ambulatory Visit: Payer: Self-pay

## 2023-10-05 DIAGNOSIS — S61452A Open bite of left hand, initial encounter: Secondary | ICD-10-CM | POA: Diagnosis not present

## 2023-10-05 DIAGNOSIS — Z23 Encounter for immunization: Secondary | ICD-10-CM | POA: Diagnosis not present

## 2023-10-05 DIAGNOSIS — W5501XA Bitten by cat, initial encounter: Secondary | ICD-10-CM | POA: Diagnosis not present

## 2023-10-05 NOTE — Telephone Encounter (Signed)
 This RN made first attempt to triage patient. No answer, unable to leave a message due to full VMB. Routing for additional attempts.   Copied from CRM [id number]. Topic: General - Other >> Oct 05, 2023  1:55 PM Godfrey Pick wrote: Reason for CRM: patient would like a call back to advise her further about a cat bite that was itching and burning.Marland Kitchen area has been clean twice and re-bandage. Patient would like to know if she needs to see a provider or needs a shot or need to do anything further. [phone].

## 2023-10-05 NOTE — Telephone Encounter (Signed)
 This RN made third and final attempt to triage patient. No answer, left a message. Routing to office for follow-up.

## 2023-10-05 NOTE — Telephone Encounter (Signed)
 Noted.

## 2023-10-05 NOTE — Telephone Encounter (Signed)
 2nd attempt left VM

## 2023-10-05 NOTE — Telephone Encounter (Signed)
 Per chart review, patient's last tetanus shot was in 2012. This information was given to patient who is on her way to Urgent Care.   Copied from CRM [id number]. Topic: Clinical - Red Word Triage >> Oct 05, 2023  4:42 PM Higinio Roger wrote: Red Word that prompted transfer to Nurse Triage: Patient was bit by a cat and wanted to know if her vaccine fro tetanus was expired Reason for Disposition  Health Information question, no triage required and triager able to answer question  Answer Assessment - Initial Assessment Questions 1. REASON FOR CALL or QUESTION: "What is your reason for calling today?" or "How can I best help you?" or "What question do you have that I can help answer?"     Patient calling to see when her last tetanus shot was.  Protocols used: Information Only Call - No Triage-A-AH

## 2023-10-05 NOTE — Telephone Encounter (Signed)
  Chief Complaint: Cat Bite Symptoms: bite to patient's left hand in between the thumb and pointer finger Frequency: occurred this morning Pertinent Negatives: Patient denies fever Disposition: [] ED /[x] Urgent Care (no appt availability in office) / [] Appointment(In office/virtual)/ []  [facility]/ [] Home Care/ [] Refused Recommended Disposition /[] [place] [facility]/ []  Follow-up with PCP Additional Notes: Patient was called three times today by previous RNs after she called with concerns of a cat bite that occurred today. Patient called back and spoke with NT.  states the cat who is an inside cat was startled by patient when she touched the animals paw. Patient endorses the cat has been stressed as they have been moving. Patient endorses a bite to her left hand in between the thumb and pointer finger "the webbing." No bleeding currently. Patient couldn't describe the bite well but did state it bleed for awhile as she is on blood thinners. Patient has a history of a hip replacement and a cardiac stent. Per protocol, patient is recommended to Urgent Care for evaluation. Patient verbalized understanding of plan and all questions answered.   Reason for Disposition  [1] Puncture wound (hole through the skin) AND [2] from a cat bite (or deep claw puncture wound)  Answer Assessment - Initial Assessment Questions 1. ANIMAL: "What type of animal caused the bite?" "Is the injury from a bite or a claw?" If the animal is a dog or a cat, ask: "Was it a pet or a stray?" "Was it acting ill or behaving strangely?"     Cat bite-pet, inside cat- 2. LOCATION: "Where is the bite located?"      Located in the web of left hand in between the thumb and pointer finger-centered more in the hand 3. SIZE: "How big is the bite?" "What does it look like?"      Patient unsure of how to describe it  4. ONSET: "When did the bite happen?" (Minutes or hours ago)      About 10:30 AM today 5. CIRCUMSTANCES: "Tell  me how this happened."      Cat was startled when patient touched the paw.  6. TETANUS: "When was your last tetanus booster?"     Patient may be overdue.  7. RABIES VACCINE: For dog or cat bites, ask: "Do you know if the pet is vaccinated against rabies?"  (e.g., yes, no, overdue for rabies shot, unknown)     Vaccinated on everything  Protocols used: Animal Bite-A-AH

## 2023-10-21 ENCOUNTER — Ambulatory Visit
Admission: RE | Admit: 2023-10-21 | Discharge: 2023-10-21 | Disposition: A | Discharge status: Home / Self Care | Source: Ambulatory Visit | Attending: Obstetrics & Gynecology | Admitting: Obstetrics & Gynecology

## 2023-10-21 DIAGNOSIS — Z853 Personal history of malignant neoplasm of breast: Secondary | ICD-10-CM | POA: Diagnosis not present

## 2023-10-21 DIAGNOSIS — N644 Mastodynia: Secondary | ICD-10-CM | POA: Diagnosis not present

## 2023-10-21 DIAGNOSIS — R9230 Dense breasts, unspecified: Secondary | ICD-10-CM

## 2023-10-21 MED ORDER — GADOPICLENOL 0.5 MMOL/ML IV SOLN
7.5000 mL | Freq: Once | INTRAVENOUS | Status: AC | PRN
  Administered 2023-10-21: 7.5 mL via INTRAVENOUS

## 2023-11-01 ENCOUNTER — Encounter: Payer: Self-pay | Admitting: Family Medicine

## 2023-11-02 ENCOUNTER — Other Ambulatory Visit: Payer: Self-pay | Admitting: Family Medicine

## 2023-11-02 MED ORDER — ALPRAZOLAM 0.5 MG PO TABS
0.2500 mg | ORAL_TABLET | Freq: Two times a day (BID) | ORAL | 1 refills | Status: DC | PRN
Start: 2023-11-02 — End: 2024-03-13

## 2023-11-29 DIAGNOSIS — H52203 Unspecified astigmatism, bilateral: Secondary | ICD-10-CM | POA: Diagnosis not present

## 2023-12-12 ENCOUNTER — Other Ambulatory Visit: Payer: Self-pay | Admitting: Nurse Practitioner

## 2023-12-12 DIAGNOSIS — R0602 Shortness of breath: Secondary | ICD-10-CM

## 2023-12-12 NOTE — Assessment & Plan Note (Signed)
 Supplement and monitor

## 2023-12-12 NOTE — Assessment & Plan Note (Signed)
 [id number] acceptable, minimize simple carbs. Increase exercise as tolerated.

## 2023-12-12 NOTE — Assessment & Plan Note (Signed)
 Hydrate and monitor

## 2023-12-12 NOTE — Assessment & Plan Note (Signed)
 Encourage heart healthy diet such as MIND or DASH diet, increase exercise, avoid trans fats, simple carbohydrates and processed foods, consider a krill or fish or flaxseed oil cap daily.

## 2023-12-13 ENCOUNTER — Telehealth (INDEPENDENT_AMBULATORY_CARE_PROVIDER_SITE_OTHER): Admitting: Family Medicine

## 2023-12-13 ENCOUNTER — Encounter: Payer: Self-pay | Admitting: Family Medicine

## 2023-12-13 VITALS — BP 130/62 | HR 82 | Ht 65.0 in | Wt 164.0 lb

## 2023-12-13 DIAGNOSIS — R252 Cramp and spasm: Secondary | ICD-10-CM

## 2023-12-13 DIAGNOSIS — E785 Hyperlipidemia, unspecified: Secondary | ICD-10-CM

## 2023-12-13 DIAGNOSIS — E559 Vitamin D deficiency, unspecified: Secondary | ICD-10-CM

## 2023-12-13 DIAGNOSIS — R739 Hyperglycemia, unspecified: Secondary | ICD-10-CM | POA: Diagnosis not present

## 2023-12-13 MED ORDER — VALACYCLOVIR HCL 1 G PO TABS: 1000.0000 mg | ORAL_TABLET | Freq: Three times a day (TID) | ORAL | 0 refills | Status: AC
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
## 2023-12-19 ENCOUNTER — Other Ambulatory Visit (INDEPENDENT_AMBULATORY_CARE_PROVIDER_SITE_OTHER)

## 2023-12-19 ENCOUNTER — Ambulatory Visit: Payer: Self-pay | Admitting: Family Medicine

## 2023-12-19 DIAGNOSIS — E785 Hyperlipidemia, unspecified: Secondary | ICD-10-CM | POA: Diagnosis not present

## 2023-12-19 DIAGNOSIS — R739 Hyperglycemia, unspecified: Secondary | ICD-10-CM

## 2023-12-19 DIAGNOSIS — E559 Vitamin D deficiency, unspecified: Secondary | ICD-10-CM | POA: Diagnosis not present

## 2023-12-19 LAB — CBC WITH DIFFERENTIAL/PLATELET
Basophils Absolute: 0 10*3/uL (ref 0.0–0.1)
Basophils Relative: 0.4 % (ref 0.0–3.0)
Eosinophils Absolute: 0.1 10*3/uL (ref 0.0–0.7)
Eosinophils Relative: 1.6 % (ref 0.0–5.0)
HCT: 41.1 % (ref 36.0–46.0)
Hemoglobin: 13.7 g/dL (ref 12.0–15.0)
Lymphocytes Relative: 25.9 % (ref 12.0–46.0)
Lymphs Abs: 1.9 10*3/uL (ref 0.7–4.0)
MCHC: 33.4 g/dL (ref 30.0–36.0)
MCV: 96.6 fl (ref 78.0–100.0)
Monocytes Absolute: 0.6 10*3/uL (ref 0.1–1.0)
Monocytes Relative: 8.6 % (ref 3.0–12.0)
Neutro Abs: 4.6 10*3/uL (ref 1.4–7.7)
Neutrophils Relative %: 63.5 % (ref 43.0–77.0)
Platelets: 268 10*3/uL (ref 150.0–400.0)
RBC: 4.26 Mil/uL (ref 3.87–5.11)
RDW: 13.2 % (ref 11.5–15.5)
WBC: 7.2 10*3/uL (ref 4.0–10.5)

## 2023-12-19 LAB — TSH: TSH: 0.95 u[IU]/mL (ref 0.35–5.50)

## 2023-12-19 LAB — LIPID PANEL
Cholesterol: 201 mg/dL — ABNORMAL HIGH (ref 0–200)
HDL: 49.4 mg/dL (ref >39.00)
LDL Cholesterol: 136 mg/dL — ABNORMAL HIGH (ref 0–99)
NonHDL: 152.09
Total CHOL/HDL Ratio: 4
Triglycerides: 81 mg/dL (ref 0.0–149.0)
VLDL: 16.2 mg/dL (ref 0.0–40.0)

## 2023-12-19 LAB — COMPREHENSIVE METABOLIC PANEL WITH GFR
ALT: 19 U/L (ref 0–35)
AST: 26 U/L (ref 0–37)
Albumin: 4 g/dL (ref 3.5–5.2)
Alkaline Phosphatase: 88 U/L (ref 39–117)
BUN: 23 mg/dL (ref 6–23)
CO2: 31 meq/L (ref 19–32)
Calcium: 8.9 mg/dL (ref 8.4–10.5)
Chloride: 99 meq/L (ref 96–112)
Creatinine, Ser: 0.88 mg/dL (ref 0.40–1.20)
GFR: 68.33 mL/min (ref >60.00)
Glucose, Bld: 97 mg/dL (ref 70–99)
Potassium: 4.2 meq/L (ref 3.5–5.1)
Sodium: 136 meq/L (ref 135–145)
Total Bilirubin: 0.4 mg/dL (ref 0.2–1.2)
Total Protein: 6.4 g/dL (ref 6.0–8.3)

## 2023-12-19 LAB — HEMOGLOBIN A1C: Hgb A1c MFr Bld: 5.9 % (ref 4.6–6.5)

## 2023-12-19 LAB — VITAMIN D 25 HYDROXY (VIT D DEFICIENCY, FRACTURES): VITD: 32.33 ng/mL (ref 30.00–100.00)

## 2023-12-22 DIAGNOSIS — H2512 Age-related nuclear cataract, left eye: Secondary | ICD-10-CM | POA: Diagnosis not present

## 2023-12-22 DIAGNOSIS — H25812 Combined forms of age-related cataract, left eye: Secondary | ICD-10-CM | POA: Diagnosis not present

## 2023-12-22 HISTORY — PX: CATARACT EXTRACTION: SUR2

## 2023-12-28 NOTE — Progress Notes (Unsigned)
 Cardiology Office Note:   Date:  12/29/2023  ID:  Tahjae Durr, DOB 05-Jan-1956, MRN [id number] PCP: Domenica Harlene LABOR, MD  [facility] Providers Cardiologist:  Lynwood Schilling, MD {  History of Present Illness:   [name] is a 68 y.o. female who presents for follow up of  nonischemic cardiomyopathy. This is thought to be related to chemotherapy in the past for breast cancer. She has had a heart catheterization in 2012. This demonstrated 50% LAD stenosis. Her EF was about 40-45% with some cavity dilatation. 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 This was confiremd by echo  There did not appear to be an ASD associated with this. She has normal right heart function and pressures. Her most recent echo in 2018 demonstrated an EF that was about 50 - 55 percent.  She did have a CT of the chest which demonstrated some aortic calcification and some coronary calcification.  EF was  low normal and not different than an old echo in 2021.    This was unchanged in Nov 2023.    In February of last year she was having some shortness of breath.  I sent her for stress testing and this was low risk with no evidence of ischemia although she only exercised for 4 minutes and 8 seconds.  She apparently had progressive symptoms and went to [facility].  I did review these records.  In May she was found to have 80% proximal LAD stenosis.  She had treatment with a Synergy stent..  She was to start Repatha but did not start this.  She denies any new cardiovascular symptoms.  She is working out at [employer]. The patient denies any new symptoms such as chest discomfort, neck or arm discomfort. There has been no new shortness of breath, PND or orthopnea. There have been no reported palpitations, presyncope or syncope.    ROS: As stated in the HPI and negative for all other systems.  Studies Reviewed:    EKG:   EKG Interpretation Date/Time:  Thursday December 29 2023  12:30:18 EDT Ventricular Rate:  69 PR Interval:  132 QRS Duration:  82 QT Interval:  404 QTC Calculation: 432 R Axis:   -7  Text Interpretation: Normal sinus rhythm Normal ECG When compared with ECG of 02-Nov-2017 18:58, No significant change since last tracing Confirmed by Schilling Lynwood ([id number]) on 12/29/2023 12:48:22 PM     Risk Assessment/Calculations:              Physical Exam:   VS:  BP 112/70   Pulse 69   Ht 5' 5 (1.651 m)   Wt 166 lb 9.6 oz (75.6 kg)   SpO2 98%   BMI 27.72 kg/m    Wt Readings from Last 3 Encounters:  12/29/23 166 lb 9.6 oz (75.6 kg)  12/13/23 164 lb (74.4 kg)  09/05/23 173 lb 3.2 oz (78.6 kg)     GEN: Well nourished, well developed in no acute distress NECK: No JVD; No carotid bruits CARDIAC: RRR, no murmurs, rubs, gallops RESPIRATORY:  Clear to auscultation without rales, wheezing or rhonchi  ABDOMEN: Soft, non-tender, non-distended EXTREMITIES:  No edema; No deformity   ASSESSMENT AND PLAN:   CAD:   She is having no ongoing symptoms.  She needs aggressive risk reduction.   See below.  She can be on Plavix alone.   ANOMALOUS SVC:     No further imaging was indicated or management.  NONISCHEMIC  CM: In April at [facility] her EF was 57%.  Global longitudinal strain was -17.5.  There were no significant valvular abnormalities.  She has had no new symptoms.  No change in therapy.    AORTIC CALCIFICATION:     This will be addressed with risk reduction as below.   DYSLIPIDEMIA:  LDL was 136.  We had a long discussion about PCSK9 and inclisiran.  She is going to consider and likely will start a PCSK9 after our conversation.     Follow up with me in 1 year  Signed, Lynwood Schilling, MD

## 2023-12-29 ENCOUNTER — Ambulatory Visit: Attending: Cardiology | Admitting: Cardiology

## 2023-12-29 ENCOUNTER — Encounter: Payer: Self-pay | Admitting: Cardiology

## 2023-12-29 VITALS — BP 112/70 | HR 69 | Ht 65.0 in | Wt 166.6 lb

## 2023-12-29 DIAGNOSIS — E785 Hyperlipidemia, unspecified: Secondary | ICD-10-CM

## 2023-12-29 DIAGNOSIS — I2510 Atherosclerotic heart disease of native coronary artery without angina pectoris: Secondary | ICD-10-CM

## 2023-12-29 DIAGNOSIS — Q249 Congenital malformation of heart, unspecified: Secondary | ICD-10-CM

## 2023-12-29 DIAGNOSIS — I420 Dilated cardiomyopathy: Secondary | ICD-10-CM | POA: Diagnosis not present

## 2023-12-29 NOTE — Patient Instructions (Signed)
 Medication Instructions:  Your physician recommends that you continue on your current medications as directed. Please refer to the Current Medication list given to you today.  *If you need a refill on your cardiac medications before your next appointment, please call your pharmacy*  Lab Work: NONE If you have labs (blood work) drawn today and your tests are completely normal, you will receive your results only by: MyChart Message (if you have MyChart) OR A paper copy in the mail If you have any lab test that is abnormal or we need to change your treatment, we will call you to review the results.  Testing/Procedures: NONE  Follow-Up: At [facility], you and your health needs are our priority.  As part of our continuing mission to provide you with exceptional heart care, our providers are all part of one team.  This team includes your primary Cardiologist (physician) and Advanced Practice Providers or APPs (Physician Assistants and Nurse Practitioners) who all work together to provide you with the care you need, when you need it.  Your next appointment:   1 year(s)  Provider:   [place], MD  We recommend signing up for the patient portal called MyChart.  Sign up information is provided on this After Visit Summary.  MyChart is used to connect with patients for Virtual Visits (Telemedicine).  Patients are able to view lab/test results, encounter notes, upcoming appointments, etc.  Non-urgent messages can be sent to your provider as well.   To learn more about what you can do with MyChart, go to [email or web address].   Other Instructions Inclisiran Injection What is this medication? INCLISIRAN (in kli SIR an) treats high cholesterol. It works by decreasing bad cholesterol (such as LDL) in your blood. Changes to diet and exercise are often combined with this medication. This medicine may be used for other purposes; ask your health care provider or pharmacist if you have  questions. COMMON BRAND NAME(S): LEQVIO What should I tell my care team before I take this medication? They need to know if you have any of these conditions: An unusual or allergic reaction to inclisiran, other medications, foods, dyes, or preservatives Pregnant or trying to get pregnant Breast-feeding How should I use this medication? This medication is injected under the skin. It is given by your care team in a hospital or clinic setting. Talk to your care team about the use of this medication in children. Special care may be needed. Overdosage: If you think you have taken too much of this medicine contact a poison control center or emergency room at once. NOTE: This medicine is only for you. Do not share this medicine with others. What if I miss a dose? Keep appointments for follow-up doses. It is important not to miss your dose. Call your care team if you are unable to keep an appointment. What may interact with this medication? Interactions are not expected. This list may not describe all possible interactions. Give your health care provider a list of all the medicines, herbs, non-prescription drugs, or dietary supplements you use. Also tell them if you smoke, drink alcohol, or use illegal drugs. Some items may interact with your medicine. What should I watch for while using this medication? Visit your care team for regular checks on your progress. Tell your care team if your symptoms do not start to get better or if they get worse. You may need blood work while you are taking this medication. What side effects may I notice from receiving this medication?  Side effects that you should report to your care team as soon as possible: Allergic reactions--skin rash, itching, hives, swelling of the face, lips, tongue, or throat Side effects that usually do not require medical attention (report these to your care team if they continue or are bothersome): Joint pain Pain, redness, or irritation  at injection site This list may not describe all possible side effects. Call your doctor for medical advice about side effects. You may report side effects to FDA at 1-800-FDA-1088. Where should I keep my medication? This medication is given in a hospital or clinic. It will not be stored at home. NOTE: This sheet is a summary. It may not cover all possible information. If you have questions about this medicine, talk to your doctor, pharmacist, or health care provider.  2024 Elsevier/Gold Standard (2023-05-27 00:00:00)

## 2024-01-05 ENCOUNTER — Ambulatory Visit (HOSPITAL_BASED_OUTPATIENT_CLINIC_OR_DEPARTMENT_OTHER)
Admission: RE | Admit: 2024-01-05 | Discharge: 2024-01-05 | Disposition: A | Discharge status: Home / Self Care | Source: Ambulatory Visit | Attending: Obstetrics & Gynecology | Admitting: Obstetrics & Gynecology

## 2024-01-05 DIAGNOSIS — Z780 Asymptomatic menopausal state: Secondary | ICD-10-CM | POA: Diagnosis not present

## 2024-01-05 DIAGNOSIS — Z13820 Encounter for screening for osteoporosis: Secondary | ICD-10-CM | POA: Insufficient documentation

## 2024-01-05 DIAGNOSIS — M8589 Other specified disorders of bone density and structure, multiple sites: Secondary | ICD-10-CM | POA: Diagnosis not present

## 2024-01-05 DIAGNOSIS — M8580 Other specified disorders of bone density and structure, unspecified site: Secondary | ICD-10-CM

## 2024-01-06 ENCOUNTER — Other Ambulatory Visit: Payer: Medicare Other

## 2024-01-09 ENCOUNTER — Ambulatory Visit (INDEPENDENT_AMBULATORY_CARE_PROVIDER_SITE_OTHER): Admitting: *Deleted

## 2024-01-09 ENCOUNTER — Telehealth: Payer: Self-pay | Admitting: *Deleted

## 2024-01-09 VITALS — Ht 65.0 in | Wt 163.0 lb

## 2024-01-09 DIAGNOSIS — Z0000 Encounter for general adult medical examination without abnormal findings: Secondary | ICD-10-CM

## 2024-01-09 NOTE — Telephone Encounter (Signed)
 FYI: Pt had AWV today.  She reported nausea after she starts eating her meals.  Reported her current weight was 163. She states she forces herself to eat. She said she had an xray towards the end of last year because of these symptoms. Symptoms have not changed but the have not gotten better.  She will discuss at OV in December unless symptoms worsen / loses weight then will call for earlier appt.

## 2024-01-09 NOTE — Patient Instructions (Signed)
 [name] , Thank you for taking time out of your busy schedule to complete your Annual Wellness Visit with me. I enjoyed our conversation and look forward to speaking with you again next year. I, as well as your care team,  appreciate your ongoing commitment to your health goals. Please review the following plan we discussed and let me know if I can assist you in the future. Your Game plan/ To Do List    Follow up Visits: Next Medicare AWV with our clinical staff: 01/10/25 9am    Next Office Visit with your provider: 06/11/24 9:40am  Clinician Recommendations:  Aim for 30 minutes of exercise or brisk walking, 6-8 glasses of water , and 5 servings of fruits and vegetables each day.       This is a list of the screening recommended for you and due dates:  Health Maintenance  Topic Date Due   Medicare Annual Wellness Visit  07/22/2023   DTaP/Tdap/Td vaccine (3 - Tdap) 04/27/2024*   Zoster (Shingles) Vaccine (1 of 2) 06/27/2024*   Pneumococcal Vaccine for age over 63 (4 of 4 - PCV20 or PCV21) 06/27/2025*   Flu Shot  01/27/2024   Mammogram  10/20/2025   Colon Cancer Screening  04/08/2029   DEXA scan (bone density measurement)  Completed   Hepatitis C Screening  Completed   Hepatitis B Vaccine  Aged Out   HPV Vaccine  Aged Out   Meningitis B Vaccine  Aged Out   COVID-19 Vaccine  Discontinued  *Topic was postponed. The date shown is not the original due date.    Advanced directives: (Copy Requested) Please bring a copy of your health care power of attorney and living will to the office to be added to your chart at your convenience. You can mail to [facility] [address] [place], [place] [address] or email to [email or web address] Advance Care Planning is important because it:  [x]  Makes sure you receive the medical care that is consistent with your values, goals, and preferences  [x]  It provides guidance to your family and loved ones and reduces their  decisional burden about whether or not they are making the right decisions based on your wishes.  Follow the link provided in your after visit summary or read over the paperwork we have mailed to you to help you started getting your Advance Directives in place. If you need assistance in completing these, please reach out to us  so that we can help you!  See attachments for Preventive Care and Fall Prevention Tips.
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
## 2024-01-19 DIAGNOSIS — H2511 Age-related nuclear cataract, right eye: Secondary | ICD-10-CM | POA: Diagnosis not present

## 2024-01-31 DIAGNOSIS — I428 Other cardiomyopathies: Secondary | ICD-10-CM | POA: Diagnosis not present

## 2024-01-31 DIAGNOSIS — Z95820 Peripheral vascular angioplasty status with implants and grafts: Secondary | ICD-10-CM | POA: Diagnosis not present

## 2024-01-31 DIAGNOSIS — R0609 Other forms of dyspnea: Secondary | ICD-10-CM | POA: Diagnosis not present

## 2024-01-31 DIAGNOSIS — I739 Peripheral vascular disease, unspecified: Secondary | ICD-10-CM | POA: Diagnosis not present

## 2024-01-31 NOTE — Progress Notes (Signed)
 Patient Active Problem List  Diagnosis  . CAD (coronary artery disease)  . Non-ischemic cardiomyopathy (CMS/HHS-HCC)  . Persistent left SVC (superior vena cava) (HHS-HCC)  . Palpitations  . Hypothyroid  . Depression  . Anxiety  . PVD (peripheral vascular disease) ()  . Breast cancer (CMS/HHS-HCC)  . Dyspnea  . S/P angioplasty with stent  . Statin intolerance    HPI  Cardiology Office Note:   Date:  12/29/2023  ID:  [name], DOB [birth year], MRN [id number] PCP: Domenica Harlene LABOR, MD  [facility] Providers Cardiologist:  Lynwood Schilling, MD {   History of Present Illness:   [name] is a 68 y.o. female who presents for follow up of  nonischemic cardiomyopathy. This is thought to be related to chemotherapy in the past for breast cancer. She has had a heart catheterization in 2012. This demonstrated 50% LAD stenosis. Her EF was about 40-45% with some cavity dilatation. 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 This was confiremd by echo  There did not appear to be an ASD associated with this. She has normal right heart function and pressures. Her most recent echo in 2018 demonstrated an EF that was about 50 - 55 percent.  She did have a CT of the chest which demonstrated some aortic calcification and some coronary calcification.  EF was  low normal and not different than an old echo in 2021.    This was unchanged in Nov 2023.     In February of last year she was having some shortness of breath.  I sent her for stress testing and this was low risk with no evidence of ischemia although she only exercised for 4 minutes and 8 seconds.  She apparently had progressive symptoms and went to [facility].  I did review these records.  In May she was found to have 80% proximal LAD stenosis.  She had treatment with a Synergy stent..  She was to start Repatha but did not start this.  She denies any new cardiovascular symptoms.  She is working  out at [employer]. The patient denies any new symptoms such as chest discomfort, neck or arm discomfort. There has been no new shortness of breath, PND or orthopnea. There have been no reported palpitations, presyncope or syncope.       CAD:  She is having no ongoing symptoms.  She needs aggressive risk reduction.   See below.  She can be on Plavix alone.   ANOMALOUS SVC: No further imaging was indicated or management.   NONISCHEMIC CM: In April at [facility] her EF was 57%.  Global longitudinal strain was -17.5.  There were no significant valvular abnormalities.  She has had no new symptoms.  No change in therapy.     AORTIC CALCIFICATION: This will be addressed with risk reduction as below.   DYSLIPIDEMIA:  LDL was 136.  We had a long discussion about PCSK9 and inclisiran.  She is going to consider and likely will start a PCSK9 after our conversation.   Lynwood Schilling, MD    [name]  is a 68 y.o. female with a PMH of persistent left SVC draining to coronary sinus, NICM 2/2 chemotherapy for breast cancer with recovered LVEF, HLD and melanoma with CAD and recent PCI to LAD who presents for evaluation and management of the following problems:   1. Coronary artery disease involving native coronary artery of native heart without angina pectoris   2.  Non-ischemic cardiomyopathy (CMS/HHS-HCC)   3. Statin intolerance   4. Pure hypercholesterolemia       CLINICAL SUMMARY: Interval Histories copied/summarized from previous clinic notes for reference:   11/10/2022 Cath hosp d/c Anticipatory Guidance (key med changes, results pending, future labs, IV therapies):  - s/p successful IVUS guided PCI of LAD with DES x 1 - Overnight observation given significant right groin hematoma and ecchymosis (stable at discharge) - DAPT: ASA + Plavix - Referred to Cardiac Rehab - Follow up with primary cardiologist as scheduled - Prescribed SL NTG for PRN use - Discussed concomitant use of clopidogrel and current  SSRI (fluoxetine ) with patient - she is amenable to reaching out to her prescribing provider (Dr. Carleton) to discuss alternative therapy   01/20/2023 Gloria)  [name] presents today for f/u  accompanied by her husband.  She feels she should have been doing better by now but she was not able to start cardiac rehab due to her husband being hospitalized with osteomyelitis.  She is supposed to start cardiac rehab next week, she is looking forward to it.  She had attempted right radial access but due to spasm then right femoral access.  No issues with right radial access.  Right femoral site has been sore but no noticeable hematoma or bump to palpation.  She has a history of myalgias to Lipitor, crestor , and a third statin that she is unsure the name of.  She used to help her sister do Repatha.  She has some ongoing occasional chest pain and shortness of breath that is random in occurrence.  She is concerned about her reduced DLCO and is establishing with pulmonary next week.  No lower extremity swelling.  She is tolerating DAPT well.  She is not taking Lasix  regularly.  She has NTG with her and has not needed to use it.  She also saw Dr. Dixon for her persistent left SVC draining to her CS, no intervention needed.  EF is recovered.   INTERVAL HISTORY: 04/26/2023 Gloria)  [name] presents today for follow-up accompanied by her husband.  She is almost done with cardiac rehab.  She has been enjoying it.  She unfortunately did not start Repatha as she would like to try to lower her cholesterol through diet and exercise.  She has been following a diet with lean protein and vegetables.  She has been walking outside of cardiac rehab.  She has felt like she has had more leg swelling recently and has been taking Lasix  more regularly.  Does not feel like she has had more salt in her diet.  She had 1 day where she felt more short of breath and this was associated with increased leg swelling.  Denies any chest  pain.  She denies any orthopnea or PND.  No dizziness.   CAD- Hx of CAD with PCI to pLAD. Doing well today, finishing up at cardiac rehab. Continues on DAPT and tolerating.  Unfortunately she is not on any lipid-lowering therapy.  She is statin intolerant.  Last visit looked into PCSK9i.  She has not started out of concern for side effects and not wanting to add another medication.  We will recheck lipids today.  We had a discussion on ezetimibe  versus PCSK9i.  She will consider.  Continue excellent exercise. NICM- Hx of NICM after chemo for breast CA. EF has now recovered. Normal in April of this year.  She notes increasing edema at home but I did not see any evidence of overt  edema on exam today.  She can continue using as needed Lasix .  Update BNP today.  History of Present Illness Ms. Nieland presents today for follow up. She has a history of PCI to the pLAD and went to cardiac rehab. However, her symptoms of dyspnea on exertion have returned. She is finding that household tasks that would typically not bother her, are now bothersome. She states mild exertional activities cause her to end her task because of difficulty breathing. When she was at cardiac rehab, she did not have these symptoms. Her symptoms abate with rest but has used her nitroglycerin  a time or two. She has also felt racing heart during the episodes of shortness of breath with mild exertion.   Current Outpatient Medications  Medication Sig Dispense Refill  . albuterol  (PROVENTIL  HFA) 90 mcg/actuation inhaler Inhale 2 inhalations into the lungs every 6 (six) hours as needed for Wheezing.    SABRA ascorbic acid (VITAMIN C) 1000 MG tablet Take 1,000 mg by mouth once daily.    . aspirin  81 MG chewable tablet Take 81 mg by mouth once daily    . b complex vitamins tablet Take 1 tablet by mouth daily.    . buPROPion  (WELLBUTRIN  XL) 300 MG XL tablet Take 150 mg by mouth once daily    . CALCIUM  CITRATE/VITAMIN D3 (CALCIUM  CITRATE + D ORAL)  Take 1 tablet by mouth once daily. 600mg  Calcium / D3 600 IU    . CHOLECALCIFEROL, VITAMIN D3, (VITAMIN D3 ORAL) Take 2,500 Units by mouth once daily.    . dicyclomine  (BENTYL ) 20 mg tablet Take 1 tablet by mouth 2 (two) times daily    . evolocumab 140 mg/mL PnIj Inject 140 mg subcutaneously every 14 (fourteen) days 6 mL 3  . fluticasone  (FLONASE ) 50 mcg/actuation nasal spray Place 2 sprays into both nostrils once daily.    . FUROsemide  (LASIX ) 40 MG tablet Take 40 mg by mouth daily as needed.    . hydrOXYzine  HCL (ATARAX ) 10 MG tablet Take 10 mg by mouth 3 (three) times daily as needed for Anxiety    . levothyroxine  (SYNTHROID , LEVOTHROID) 75 MCG tablet Take 75 mcg by mouth daily. Take on an empty stomach with a glass of water  at least 30-60 minutes before breakfast.    . loratadine  (CLARITIN ) 10 mg tablet Take 10 mg by mouth once daily.    . multivitamin capsule Take 1 capsule by mouth daily.    . nitroGLYcerin  (NITROSTAT ) 0.4 MG SL tablet Place 1 tablet (0.4 mg total) under the tongue every 5 (five) minutes as needed May take up to 3 doses. 25 tablet 11  . ondansetron  (ZOFRAN ) 8 MG tablet Take 8 mg by mouth every 8 (eight) hours as needed for Nausea.    . prednisoLONE acetate (PRED FORTE) 1 % ophthalmic suspension Apply 1 drop to eye    . tobramycin (TOBREX) 0.3 % ophthalmic solution     . traZODone  (DESYREL ) 50 MG tablet Take 50 mg by mouth at bedtime as needed for Sleep    . vitamin E 400 UNIT capsule Take 400 Units by mouth daily.    SABRA ZINC GLUCONATE ORAL Take 1 tablet by mouth once daily.     No current facility-administered medications for this visit.    Allergies  Allergen Reactions  . Lamictal [Lamotrigine] Other (See Comments)    Stevens-Johnson syndrome   . Morphine Anaphylaxis  . Codeine Unknown  . Cymbalta [Duloxetine] Other (See Comments)    abdominal pain  .  Rosuvastatin  Muscle Pain    myalgia  . Zoloft [Sertraline] Headache  . Atorvastatin  Muscle Pain    myalgias  .  Avelox  [Moxifloxacin ] Other (See Comments)    Muscle aches, slurred speech  . Neomycin -Polymyxin B-Dexameth Itching    Itching all over  . Rocephin  [Ceftriaxone ] Itching    Past Medical History:  Diagnosis Date  . Allergic rhinitis   . Anxiety   . Cancer (CMS/HHS-HCC) 1994   right breast  . Coronary artery disease   . Depression   . Dyspnea   . Edema of lower extremity   . Fatigue   . Migraines   . Neuropathy   . Thyroid  disease    hypo  . Weakness of right hand     Past Surgical History:  Procedure Laterality Date  . MASTECTOMY PARTIAL Right 1994   cancer  . transplant, bone marrow   1/95  . ARTHROSCOPY KNEE Right   . CARDIAC CATHETERIZATION    . COMBINED HYSTEROSCOPY DIAGNOSTIC / D&C    . right axillary node dissection      Social History   Socioeconomic History  . Marital status: Married  Tobacco Use  . Smoking status: Never  . Smokeless tobacco: Never  Vaping Use  . Vaping status: Never Used  Substance and Sexual Activity  . Alcohol use: No    Comment: OCCA\RARE  . Drug use: No  . Sexual activity: Yes   Social Drivers of Corporate investment banker Strain: Low Risk  (01/09/2024)   Received from [facility]   Overall Financial Resource Strain (CARDIA)   . How hard is it for you to pay for the very basics like food, housing, medical care, and heating?: Not hard at all  Food Insecurity: No Food Insecurity (01/09/2024)   Received from [facility]   Hunger Vital Sign   . Within the past 12 months, you worried that your food would run out before you got the money to buy more.: Never true   . Within the past 12 months, the food you bought just didn't last and you didn't have money to get more.: Never true  Transportation Needs: No Transportation Needs (01/09/2024)   Received from [facility] - Transportation   . In the past 12 months, has lack of transportation kept you from medical appointments or from getting medications?: No   . In the past 12  months, has lack of transportation kept you from meetings, work, or from getting things needed for daily living?: No  Physical Activity: Sufficiently Active (01/09/2024)   Received from [facility]   Exercise Vital Sign   . On average, how many days per week do you engage in moderate to strenuous exercise (like a brisk walk)?: 5 days   . On average, how many minutes do you engage in exercise at this level?: 90 min  Stress: No Stress Concern Present (01/09/2024)   Received from [facility] - Occupational Stress Questionnaire   . Do you feel stress - tense, restless, nervous, or anxious, or unable to sleep at night because your mind is troubled all the time - these days?: Only a little  Social Connections: Socially Integrated (01/09/2024)   Received from [facility]   Social Connection and Isolation Panel   . In a typical week, how many times do you talk on the phone with family, friends, or neighbors?: More than three times a week   . How  often do you get together with friends or relatives?: Three times a week   . How often do you attend church or religious services?: More than 4 times per year   . Do you belong to any clubs or organizations such as church groups, unions, fraternal or athletic groups, or school groups?: Yes   . How often do you attend meetings of the clubs or organizations you belong to?: More than 4 times per year   . Are you married, widowed, divorced, separated, never married, or living with a partner?: Married  Housing Stability: Unknown (01/31/2024)   Housing Stability Vital Sign   . Homeless in the Last Year: No    Family History  Problem Relation Name Age of Onset  . COPD Mother    . Heart failure Mother    . Stroke Father    . Myocardial Infarction (Heart attack) Father    . High blood pressure (Hypertension) Sister    . Stroke Sister    . Obesity Sister    . COPD Sister       REVIEW OF SYSTEMS: 16point ROS  negative   PHYSICAL EXAMINATION:   Wt Readings from Last 3 Encounters:  01/31/24 75.4 kg (166 lb 3.2 oz)  04/26/23 78.7 kg (173 lb 9.6 oz)  01/24/23 77.6 kg (171 lb)   Temp Readings from Last 3 Encounters:  11/11/22 36.8 C (98.2 F) (Oral)  10/08/22 36.3 C (97.4 F) (Oral)  11/27/13 36.7 C (98.1 F) (Oral)   BP Readings from Last 3 Encounters:  01/31/24 122/74  04/26/23 118/70  01/24/23 136/57   Pulse Readings from Last 3 Encounters:  01/31/24 77  04/26/23 85  01/24/23 56     GENERAL APPEARANCE: Well developed, well nourished, alert and cooperative, and appears to be in no acute distress.  HEAD: normocephalic.  EYES: PERRL, EOMI. vision is grossly intact.  EARS: External auditory canals and tympanic membranes clear, hearing grossly intact.  NOSE: No nasal discharge.  THROAT: Oral cavity and pharynx normal. No inflammation, swelling, exudate, or lesions. Teeth and gingiva in good general condition.  NECK: Neck supple, non-tender without lymphadenopathy, masses or thyromegaly.  CARDIAC: Normal S1 and S2. No S3, S4 or murmurs. Rhythm is regular. There is no peripheral edema, cyanosis or pallor. Extremities are warm and well perfused. Capillary refill is less than 2 seconds. No carotid bruits.  LUNGS: Clear to auscultation and percussion without rales, rhonchi, wheezing or diminished breath sounds.  ABDOMEN: Positive bowel sounds. Soft, nondistended, nontender. No guarding or rebound. No masses.  MUSKULOSKELETAL: Adequately aligned spine. ROM intact spine and extremities. No joint erythema or tenderness. Normal muscular development. Normal gait.  BACK: Examination of the spine reveals normal gait and posture, no spinal deformity, symmetry of spinal muscles, without tenderness, decreased range of motion or muscular spasm.  EXTREMITIES: No significant deformity or joint abnormality. No edema. Peripheral pulses intact. No varicosities.  LOWER EXTREMITY: Examination of  both feet reveals all toes to be normal in size and symmetry, normal range of motion, normal sensation with distal capillary filling of less than 2 seconds without tenderness, swelling, discoloration, nodules, weakness or deformity;   NEUROLOGICAL: CN II-XII intact.   SKIN: Skin normal color, texture and turgor with no lesions or eruptions.  PSYCHIATRIC: The mental examination revealed the patient was oriented to person, place, and time. The patient was able to demonstrate good judgement and reason, without hallucinations, abnormal affect or abnormal behaviors during the examination. Patient is not suicidal.  Pertinent  Laboratories EKG -   No results found for: LABA1C, BMP, MG, CBC, TROPONINI, BNP, EKG   Results for orders placed during the hospital encounter of 11/10/22  CATH CORONARY ANGIOGRAPHY W LHC  Narrative Impressions:  1. Small, but dominant RCA with no angiographic disease. 2. Normal appearing circumflex. 3. 80% proximal LAD narrowing with iFR of 0.89 (CTA FFR was 0.88 4. Successful IVUS guided PCI of the LAD with a 2.5 x 15 Synergy DES post dilated on the proximal end with a 3 mm [place] balloon. 5. There was significant spasm of the right radial artery with a guide catheter prompting default to the right femoral artery.   State: Pre-PCI Vessel: Proximal LAD   LESION: Proximal LAD 80% STENTS: FRONTIER ONYX 2.50X15MM;   Max inflation: 14 atm; ANTI-THROMBOTIC THERAPY; (List meds)  IVUS used to optimize PCI; State: Pre-PCI Vessel: Proximal LAD   LESION: Proximal LAD 80% STENTS: FRONTIER ONYX 2.50X15MM;   Max inflation: 14 atm; ANTI-THROMBOTIC THERAPY; (List meds)  IVUS used to optimize PCI; State: Pre-PCI Vessel: Proximal LAD    Recommendations:  1. Sheath out from the right radial with TR band protocol. 2. Femoral sheath secure to skin with removal by manual compression once the ACT is less than 200 sec. 3. Return to outpatient holding in  stable condition. 4. Load with plavix 600 mg in the cath lab. 5. DAPT for the next 3 to 6 months if possible.    Labs: Lab Results  Component Value Date   CHOLTOTAL 232 04/26/2023   CHOLTOTAL 190 12/15/2011    Lab Results  Component Value Date   LDLCALC 143 04/26/2023   LDLCALC 128 12/15/2011    Lab Results  Component Value Date   HDL 57 04/26/2023   HDL 52 12/15/2011    Lab Results  Component Value Date   TRIG 158 04/26/2023   TRIG 54 12/15/2011    Lab Results  Component Value Date   HDL 57 04/26/2023   HDL 52 12/15/2011   Lab Results  Component Value Date   LDLCALC 143 04/26/2023   LDLCALC 128 12/15/2011   Lab Results  Component Value Date   TRIG 158 04/26/2023   TRIG 54 12/15/2011   No results found for: CHOLHDL    No results found for: HGBA1C, HBA1C       ASSESSMENT AND PLAN:   Assessment & Plan Arhianna Ebey  is a 68 y.o. female with a PMH of persistent left SVC draining to coronary sinus, NICM 2/2 chemotherapy for breast cancer with recovered LVEF, HLD and melanoma with CAD and recent PCI to LAD who presents for evaluation and management of recurrent and shortness of breath with exertion. Given known recent pLAD  s/p cardiac rehab, now with worsening shortness  of breath with exertion and ntg use, will proceed with LHC.   Tachycardia will be evaluated with monitor and echo for evaluation of EF     1. S/P angioplasty with stent -     CATH coronary angiography (coronary arteries only); Future -     CARD holter monitoring from 3 to 7 days - interpretation; Future -     CARD holter monitoring from 3 to 7 days - hook up; Future -     Echo complete; Future  2. Non-ischemic cardiomyopathy (CMS/HHS-HCC) -     CATH coronary angiography (coronary arteries only); Future -     CARD holter monitoring from 3 to 7 days - interpretation; Future -     CARD holter  monitoring from 3 to 7 days - hook up; Future -     Echo complete; Future  3.  Persistent left SVC (superior vena cava) (HHS-HCC) -     CATH coronary angiography (coronary arteries only); Future -     CARD holter monitoring from 3 to 7 days - interpretation; Future -     CARD holter monitoring from 3 to 7 days - hook up; Future -     Echo complete; Future  4. Palpitations -     CATH coronary angiography (coronary arteries only); Future -     CARD holter monitoring from 3 to 7 days - interpretation; Future -     CARD holter monitoring from 3 to 7 days - hook up; Future -     Echo complete; Future  5. PVD (peripheral vascular disease) () -     CATH coronary angiography (coronary arteries only); Future -     CARD holter monitoring from 3 to 7 days - interpretation; Future -     CARD holter monitoring from 3 to 7 days - hook up; Future -     Echo complete; Future  6. Dyspnea on exertion -     CATH coronary angiography (coronary arteries only); Future -     CARD holter monitoring from 3 to 7 days - interpretation; Future -     CARD holter monitoring from 3 to 7 days - hook up; Future -     Echo complete; Future  7. Hypothyroidism due to acquired atrophy of thyroid  -     CATH coronary angiography (coronary arteries only); Future -     CARD holter monitoring from 3 to 7 days - interpretation; Future -     CARD holter monitoring from 3 to 7 days - hook up; Future -     Echo complete; Future  8. Statin intolerance -     CATH coronary angiography (coronary arteries only); Future -     CARD holter monitoring from 3 to 7 days - interpretation; Future -     CARD holter monitoring from 3 to 7 days - hook up; Future -     Echo complete; Future -     evolocumab 140 mg/mL PnIj; Inject 140 mg subcutaneously every 14 (fourteen) days  Dispense: 6 mL; Refill: 3  9. Current mild episode of major depressive disorder without prior episode () -     CATH coronary angiography (coronary arteries only); Future -     CARD holter monitoring from 3 to 7 days - interpretation; Future -      CARD holter monitoring from 3 to 7 days - hook up; Future -     Echo complete; Future  10. Anxiety -     CATH coronary angiography (coronary arteries only); Future -     CARD holter monitoring from 3 to 7 days - interpretation; Future -     CARD holter monitoring from 3 to 7 days - hook up; Future -     Echo complete; Future  11. Angina pectoris () -     CATH coronary angiography (coronary arteries only); Future -     CARD holter monitoring from 3 to 7 days - interpretation; Future -     CARD holter monitoring from 3 to 7 days - hook up; Future -     Echo complete; Future  12. DOE (dyspnea on exertion) -     CATH coronary angiography (coronary arteries only); Future -  CARD holter monitoring from 3 to 7 days - interpretation; Future -     CARD holter monitoring from 3 to 7 days - hook up; Future -     Echo complete; Future  13. Coronary artery disease involving native coronary artery of native heart, unspecified whether angina present -     evolocumab 140 mg/mL PnIj; Inject 140 mg subcutaneously every 14 (fourteen) days  Dispense: 6 mL; Refill: 3     Attestation Statement:   I personally performed the service. (TP)  BRANDY PATTERSON, MD    I spent a total of 40 minutes in both face-to-face and non-face-to-face activities for this visit on the date of this encounter.   Leotis Gander, MD  Answers submitted by the patient for this visit: Recent Medical Symptoms (Submitted on 01/31/2024) Night sweats: No Appetite Loss: Yes Weight loss: No Fever: No Daytime sleepiness: Yes visual change: Yes Hearing loss: No Sore throat: No Hoarseness: No Cough: No Hemoptysis (Coughing up blood): No Shortness of breath: Yes Chest pain: Yes Tachycardia (heart racing): Yes leg pain: Yes Nausea: Yes Vomiting: No Diarrhea: No Constipation: Yes Abdominal pain: Yes Bowel habits change: No Melena (Black tar-like stool): No Dysuria (Pain with urination): No Frequency (The  need to urinate many times a day): No Hesitancy (Difficulty in beginning the flow of urine): No Joint pain: Yes Joint swelling: No Myalgias (Muscle pain): No Rash: No Pigment changes/discoloration: No Memory loss: No Syncope (Fainting or passing out): No Extremity weakness: No Paresthesias (Pins and Needles): Yes Loss of balance: No

## 2024-02-08 DIAGNOSIS — R002 Palpitations: Secondary | ICD-10-CM | POA: Diagnosis not present

## 2024-02-08 DIAGNOSIS — Q261 Persistent left superior vena cava: Secondary | ICD-10-CM | POA: Diagnosis not present

## 2024-02-08 DIAGNOSIS — I428 Other cardiomyopathies: Secondary | ICD-10-CM | POA: Diagnosis not present

## 2024-02-08 DIAGNOSIS — I739 Peripheral vascular disease, unspecified: Secondary | ICD-10-CM | POA: Diagnosis not present

## 2024-02-08 NOTE — Progress Notes (Signed)
 Cardiac Catheterization Pre-Procedure  I have reviewed the medical and surgical history, family history, review of systems, current medications, allergies and sensitivities, physical examination, laboratory and diagnostic data.   [name] is a 68 y.o. female with a PMHX of persistent left SVC draining to coronary sinus, CAD s/p PCI to LAD (10/2022), NICM 2/2 chemotherapy for breast cancer with recovered LVEF, HLD and melanoma, who presents for coronary angiography in setting of progressive SOB.  LHC 11/10/2022: Coronary arteries Dominance: right iFR/DFR State Vessel Segment Stenosis iFR/DFR Baseline Proximal LAD 0.89 Hemodynamics (mm Hg) State: Baseline Ao/BP (asc Ao): 142/67 Mean: 99 mmHg Interventions Lesion: Proximal LAD 80% to normal  Labs:  Latest Reference Range & Units 02/10/24 07:23  Hemoglobin, Arterial 12.0 - 15.5 g/dL 85.6  Hematocrit, Arterial 35.0 - 45.0 % 43.0  Sodium, Arterial 135 - 145 mmol/L 136  Potassium, Arterial 3.5 - 5.0 mmol/L 4.0  POC Cr 1.0 Plts 12/19/23: 268 INR 11/10/22: 1.2  EKG: ordered Plan for procedure: cardiac catheterization, coronary angiogram  Alternative options: no catheterization The procedures are indicated to evaluate and/or treat (diagnosis): SOB  Plan for moderate sedation: Moderate sedation indicated to provide moderate sedation and analgesia during the invasive procedure. Adverse experiences with sedation / analgesia reviewed. The patient is an appropriate candidate for moderate sedation. Planned sedation may include: Diphenhydramine  (Benadryl ), Midazolam  (Versed ), Hydromorphone  (Dilaudid ) and Fentanyl  (Sublimaze )  ASA Classification: III - Severe disease  Mallampati Classification: deferred  Plan for blood product transfusion: transfusion not anticipated  PHYSICAL EXAM: BP 128/60   Pulse 74   Temp 36.8 C (98.2 F) (Oral)   Resp 15   Ht 165.1 cm (5' 5)   Wt 73.7 kg (162 lb 7.7 oz)   SpO2 98%   BMI 27.04 kg/m  Body mass  index is 27.04 kg/m. General appearance: alert, cooperative, pleasant, in no acute distress Head: Normocephalic, atraumatic Heart: regular rate and rhythm, without murmur Lungs: clear to auscultation, without rales or wheeze Abdomen: soft, nondistended, nontender Ext: no edema in LE bilaterally, 2+ femorals no bruit, 2+ DP/PTs  The patient was interviewed and examined, and the available chart reviewed.  The indications, procedure, risk, potential complications (as listed on the respective consent forms), and alternatives for both the procedure and for moderate sedation were explained and discussed.  The patient understands and has signed the informed consent for the procedure and the informed consent for moderate sedation.  Plan for post-procedure care needs: Outpatient holding  TIFFANY BARBARANN MASSA, NP 02/10/2024

## 2024-02-10 DIAGNOSIS — Q261 Persistent left superior vena cava: Secondary | ICD-10-CM | POA: Diagnosis not present

## 2024-02-10 DIAGNOSIS — Z853 Personal history of malignant neoplasm of breast: Secondary | ICD-10-CM | POA: Diagnosis not present

## 2024-02-10 DIAGNOSIS — I427 Cardiomyopathy due to drug and external agent: Secondary | ICD-10-CM | POA: Diagnosis not present

## 2024-02-10 DIAGNOSIS — I25118 Atherosclerotic heart disease of native coronary artery with other forms of angina pectoris: Secondary | ICD-10-CM | POA: Diagnosis not present

## 2024-02-10 DIAGNOSIS — E785 Hyperlipidemia, unspecified: Secondary | ICD-10-CM | POA: Diagnosis not present

## 2024-02-10 DIAGNOSIS — Z9861 Coronary angioplasty status: Secondary | ICD-10-CM | POA: Diagnosis not present

## 2024-02-10 DIAGNOSIS — I428 Other cardiomyopathies: Secondary | ICD-10-CM | POA: Diagnosis not present

## 2024-02-21 ENCOUNTER — Other Ambulatory Visit: Payer: Self-pay | Admitting: Family Medicine

## 2024-03-13 ENCOUNTER — Other Ambulatory Visit: Payer: Self-pay | Admitting: Family Medicine

## 2024-03-13 NOTE — Telephone Encounter (Signed)
 Requesting: alprazolam  0.5mg   Contract: None UDS: None Last Visit: 12/13/23 Next Visit: 06/11/24 Last Refill: 11/02/23 #30 and 1RF   Please Advise

## 2024-03-20 DIAGNOSIS — K080 Exfoliation of teeth due to systemic causes: Secondary | ICD-10-CM | POA: Diagnosis not present

## 2024-03-28 DIAGNOSIS — C4359 Malignant melanoma of other part of trunk: Secondary | ICD-10-CM | POA: Diagnosis not present

## 2024-04-03 DIAGNOSIS — L218 Other seborrheic dermatitis: Secondary | ICD-10-CM | POA: Diagnosis not present

## 2024-04-03 DIAGNOSIS — L820 Inflamed seborrheic keratosis: Secondary | ICD-10-CM | POA: Diagnosis not present

## 2024-04-11 DIAGNOSIS — G8929 Other chronic pain: Secondary | ICD-10-CM | POA: Diagnosis not present

## 2024-04-11 DIAGNOSIS — G90521 Complex regional pain syndrome I of right lower limb: Secondary | ICD-10-CM | POA: Diagnosis not present

## 2024-04-11 DIAGNOSIS — Z96641 Presence of right artificial hip joint: Secondary | ICD-10-CM | POA: Diagnosis not present

## 2024-04-11 DIAGNOSIS — M7061 Trochanteric bursitis, right hip: Secondary | ICD-10-CM | POA: Diagnosis not present

## 2024-04-11 DIAGNOSIS — M1711 Unilateral primary osteoarthritis, right knee: Secondary | ICD-10-CM | POA: Diagnosis not present

## 2024-04-12 ENCOUNTER — Other Ambulatory Visit: Payer: Self-pay | Admitting: Family Medicine

## 2024-04-24 DIAGNOSIS — M7918 Myalgia, other site: Secondary | ICD-10-CM | POA: Diagnosis not present

## 2024-05-02 ENCOUNTER — Other Ambulatory Visit: Payer: Self-pay | Admitting: Nurse Practitioner

## 2024-05-02 DIAGNOSIS — R109 Unspecified abdominal pain: Secondary | ICD-10-CM

## 2024-05-02 DIAGNOSIS — Z860101 Personal history of adenomatous and serrated colon polyps: Secondary | ICD-10-CM | POA: Diagnosis not present

## 2024-05-02 DIAGNOSIS — R634 Abnormal weight loss: Secondary | ICD-10-CM

## 2024-05-02 DIAGNOSIS — R110 Nausea: Secondary | ICD-10-CM

## 2024-05-02 DIAGNOSIS — K5900 Constipation, unspecified: Secondary | ICD-10-CM | POA: Diagnosis not present

## 2024-05-08 ENCOUNTER — Ambulatory Visit
Admission: RE | Admit: 2024-05-08 | Discharge: 2024-05-08 | Disposition: A | Discharge status: Home / Self Care | Source: Ambulatory Visit | Attending: Nurse Practitioner | Admitting: Nurse Practitioner

## 2024-05-08 DIAGNOSIS — R1032 Left lower quadrant pain: Secondary | ICD-10-CM | POA: Diagnosis not present

## 2024-05-08 DIAGNOSIS — R634 Abnormal weight loss: Secondary | ICD-10-CM

## 2024-05-08 DIAGNOSIS — R109 Unspecified abdominal pain: Secondary | ICD-10-CM

## 2024-05-08 DIAGNOSIS — R110 Nausea: Secondary | ICD-10-CM

## 2024-05-08 MED ORDER — IOPAMIDOL (ISOVUE-300) INJECTION 61%
100.0000 mL | Freq: Once | INTRAVENOUS | Status: AC | PRN
  Administered 2024-05-08: 100 mL via INTRAVENOUS

## 2024-05-14 ENCOUNTER — Other Ambulatory Visit: Payer: Self-pay | Admitting: Gastroenterology

## 2024-05-28 ENCOUNTER — Other Ambulatory Visit: Payer: Self-pay | Admitting: Family Medicine

## 2024-05-28 ENCOUNTER — Other Ambulatory Visit: Payer: Self-pay

## 2024-05-28 MED ORDER — LEVOTHYROXINE SODIUM 75 MCG PO TABS: 75.0000 ug | ORAL_TABLET | Freq: Every day | ORAL | 1 refills | Status: DC

## 2024-05-29 NOTE — Telephone Encounter (Unsigned)
 Copied from CRM #[id number]. Topic: Clinical - Medication Question >> May 29, 2024  1:54 PM Drema MATSU wrote: Reason for CRM: Charmaine with [employer] is needing a manufacture change for levothyroxine  (SYNTHROID ) 75 MCG tablet. She stated that they are going from [employer] to [employer].

## 2024-05-29 NOTE — Telephone Encounter (Addendum)
 Rx sent yesterday x2 with pharmacy note- okay to change manufacturer, however I have LM on provider line (HT closed for lunch currently) with verbal okay to change manufacturer.      Disp Refills Start End   levothyroxine  (SYNTHROID ) 75 MCG tablet 90 tablet 1 05/28/2024 --   Sig - Route: Take 1 tablet (75 mcg total) by mouth daily before breakfast. - Oral   Sent to pharmacy as: levothyroxine  (SYNTHROID ) 75 MCG tablet   Notes to Pharmacy: Okay to change manufacturer if needed. See other prescription sent earlier today w/ same instructions...   E-Prescribing Status: Receipt confirmed by pharmacy (05/28/2024 11:59 AM EST)

## 2024-06-04 DIAGNOSIS — K080 Exfoliation of teeth due to systemic causes: Secondary | ICD-10-CM | POA: Diagnosis not present

## 2024-06-10 ENCOUNTER — Encounter (HOSPITAL_COMMUNITY): Payer: Self-pay | Admitting: Gastroenterology

## 2024-06-10 NOTE — Anesthesia Preprocedure Evaluation (Signed)
 Anesthesia Evaluation  Patient identified by MRN, date of birth, ID band Patient awake    Reviewed: Allergy & Precautions, NPO status , Patient's Chart, lab work & pertinent test results  History of Anesthesia Complications Negative for: history of anesthetic complications  Airway Mallampati: II  TM Distance: >3 FB Neck ROM: Full    Dental  (+) Dental Advisory Given   Pulmonary neg shortness of breath, asthma , neg sleep apnea, neg COPD, neg recent URI   Pulmonary exam normal breath sounds clear to auscultation       Cardiovascular (-) hypertension(-) angina + CAD (s/p PCI to LAD (10/2022)) and + Cardiac Stents  (-) CABG (-) dysrhythmias  Rhythm:Regular Rate:Normal  Persistent SVC  Low-risk stress test 08/25/2022  TTE 05/12/2022: IMPRESSIONS    1. Left ventricular ejection fraction, by estimation, is 50 to 55%. Left  ventricular ejection fraction by 3D volume is 51 %. The left ventricle has  low normal function. The left ventricle has no regional wall motion  abnormalities. Left ventricular  diastolic parameters were normal. The average left ventricular global  longitudinal strain is -17.1 %.   2. Right ventricular systolic function is normal. The right ventricular  size is normal. There is normal pulmonary artery systolic pressure. The  estimated right ventricular systolic pressure is 24.7 mmHg.   3. The mitral valve is normal in structure. Trivial mitral valve  regurgitation. No evidence of mitral stenosis.   4. The aortic valve was not well visualized. Aortic valve regurgitation  is not visualized. No aortic stenosis is present.   5. The inferior vena cava is normal in size with greater than 50%  respiratory variability, suggesting right atrial pressure of 3 mmHg.   LHC 02/10/2024: Impressions:   1. Very small, normal appearing RCA.  2. Normal appearing circumflex.  3. Nicely patent stent in the proximal LAD with a  20% narrowing in the mid LAD.  4. LVEDP 5 mmHg    RHC 05/08/2015:  Normal right heart pressures  Normal oximetry sampling  Normal cardiac output  Coronary calcification, mild    Neuro/Psych neg Seizures PSYCHIATRIC DISORDERS (PTSD) Anxiety Depression    Tremor    GI/Hepatic Neg liver ROS, hiatal hernia, Bowel prep,GERD  ,,  Endo/Other  Hypothyroidism    Renal/GU Renal InsufficiencyRenal disease     Musculoskeletal  (+) Arthritis , Osteoarthritis,    Abdominal   Peds  Hematology negative hematology ROS (+)   Anesthesia Other Findings H/o bone marrow transplant 1995  Last Plavix: stopped in September  Reproductive/Obstetrics H/o breast cancer                              Anesthesia Physical Anesthesia Plan  ASA: 3  Anesthesia Plan: MAC   Post-op Pain Management: Minimal or no pain anticipated   Induction: Intravenous  PONV Risk Score and Plan: 2 and Propofol  infusion, TIVA and Treatment may vary due to age or medical condition  Airway Management Planned: Natural Airway and Nasal Cannula  Additional Equipment:   Intra-op Plan:   Post-operative Plan:   Informed Consent: I have reviewed the patients History and Physical, chart, labs and discussed the procedure including the risks, benefits and alternatives for the proposed anesthesia with the patient or authorized representative who has indicated his/her understanding and acceptance.     Dental advisory given  Plan Discussed with: CRNA and Anesthesiologist  Anesthesia Plan Comments: (Discussed with patient risks of MAC including,  but not limited to, minor pain or discomfort, hearing people in the room, and possible need for backup general anesthesia. Risks for general anesthesia also discussed including, but not limited to, sore throat, hoarse voice, chipped/damaged teeth, injury to vocal cords, nausea and vomiting, allergic reactions, lung infection, heart attack, stroke, and  death. All questions answered. )         Anesthesia Quick Evaluation

## 2024-06-11 ENCOUNTER — Ambulatory Visit (HOSPITAL_COMMUNITY): Payer: Self-pay | Admitting: Anesthesiology

## 2024-06-11 ENCOUNTER — Encounter (HOSPITAL_COMMUNITY): Admission: RE | Disposition: A | Payer: Self-pay | Source: Home / Self Care | Attending: Gastroenterology

## 2024-06-11 ENCOUNTER — Ambulatory Visit: Admitting: Family Medicine

## 2024-06-11 ENCOUNTER — Encounter (HOSPITAL_COMMUNITY): Payer: Self-pay | Admitting: Gastroenterology

## 2024-06-11 ENCOUNTER — Ambulatory Visit (HOSPITAL_COMMUNITY)
Admission: RE | Admit: 2024-06-11 | Discharge: 2024-06-11 | Disposition: A | Attending: Gastroenterology | Admitting: Gastroenterology

## 2024-06-11 ENCOUNTER — Other Ambulatory Visit: Payer: Self-pay

## 2024-06-11 DIAGNOSIS — M1990 Unspecified osteoarthritis, unspecified site: Secondary | ICD-10-CM | POA: Insufficient documentation

## 2024-06-11 DIAGNOSIS — D120 Benign neoplasm of cecum: Secondary | ICD-10-CM

## 2024-06-11 DIAGNOSIS — K222 Esophageal obstruction: Secondary | ICD-10-CM

## 2024-06-11 DIAGNOSIS — K449 Diaphragmatic hernia without obstruction or gangrene: Secondary | ICD-10-CM | POA: Diagnosis not present

## 2024-06-11 DIAGNOSIS — Z955 Presence of coronary angioplasty implant and graft: Secondary | ICD-10-CM | POA: Insufficient documentation

## 2024-06-11 DIAGNOSIS — I2510 Atherosclerotic heart disease of native coronary artery without angina pectoris: Secondary | ICD-10-CM | POA: Diagnosis not present

## 2024-06-11 DIAGNOSIS — E039 Hypothyroidism, unspecified: Secondary | ICD-10-CM

## 2024-06-11 DIAGNOSIS — Z1211 Encounter for screening for malignant neoplasm of colon: Secondary | ICD-10-CM | POA: Insufficient documentation

## 2024-06-11 DIAGNOSIS — K644 Residual hemorrhoidal skin tags: Secondary | ICD-10-CM | POA: Diagnosis not present

## 2024-06-11 DIAGNOSIS — K635 Polyp of colon: Secondary | ICD-10-CM | POA: Insufficient documentation

## 2024-06-11 DIAGNOSIS — K219 Gastro-esophageal reflux disease without esophagitis: Secondary | ICD-10-CM | POA: Insufficient documentation

## 2024-06-11 DIAGNOSIS — Z952 Presence of prosthetic heart valve: Secondary | ICD-10-CM | POA: Insufficient documentation

## 2024-06-11 DIAGNOSIS — R1310 Dysphagia, unspecified: Secondary | ICD-10-CM | POA: Insufficient documentation

## 2024-06-11 DIAGNOSIS — K648 Other hemorrhoids: Secondary | ICD-10-CM | POA: Insufficient documentation

## 2024-06-11 DIAGNOSIS — Z853 Personal history of malignant neoplasm of breast: Secondary | ICD-10-CM | POA: Insufficient documentation

## 2024-06-11 DIAGNOSIS — K5730 Diverticulosis of large intestine without perforation or abscess without bleeding: Secondary | ICD-10-CM | POA: Diagnosis not present

## 2024-06-11 HISTORY — PX: POLYPECTOMY: SHX149

## 2024-06-11 HISTORY — PX: COLONOSCOPY: SHX5424

## 2024-06-11 HISTORY — PX: SAVORY DILATION: SHX5439

## 2024-06-11 HISTORY — PX: ESOPHAGOGASTRODUODENOSCOPY: SHX5428

## 2024-06-11 SURGERY — COLONOSCOPY
Anesthesia: Monitor Anesthesia Care

## 2024-06-11 MED ORDER — SODIUM CHLORIDE 0.9 % IV SOLN: INTRAVENOUS | Status: DC

## 2024-06-11 MED ORDER — PROPOFOL 10 MG/ML IV BOLUS
INTRAVENOUS | Status: DC | PRN
  Administered 2024-06-11 (×2): 50 mg via INTRAVENOUS
  Administered 2024-06-11 (×3): 20 mg via INTRAVENOUS
  Administered 2024-06-11: 09:00:00 10 mg via INTRAVENOUS

## 2024-06-11 MED ORDER — PROPOFOL 500 MG/50ML IV EMUL
INTRAVENOUS | Status: DC | PRN
  Administered 2024-06-11: 09:00:00 100 ug/kg/min via INTRAVENOUS
  Administered 2024-06-11: 09:00:00 150 ug/kg/min via INTRAVENOUS

## 2024-06-11 MED ORDER — LIDOCAINE 2% (20 MG/ML) 5 ML SYRINGE
INTRAMUSCULAR | Status: DC | PRN
  Administered 2024-06-11: 09:00:00 80 mg via INTRAVENOUS

## 2024-06-11 NOTE — Op Note (Signed)
 [facility] Patient Name: [name] Procedure Date : 06/11/2024 MRN: [id number] Attending MD: Oliva Boots , MD, [id number] Date of Birth: 1956/03/07 CSN: [id number] Age: 68 Admit Type: Outpatient Procedure:                Colonoscopy Indications:              High risk colon cancer surveillance: Personal                            history of colonic polyps, Last colonoscopy:                            October 2020 Providers:                Oliva Boots, MD, Gregoria Pierce, RN, Janie                            Billups, Technician Referring MD:              Medicines:                Monitored Anesthesia Care Complications:            No immediate complications. Estimated Blood Loss:     Estimated blood loss: none. Procedure:                Pre-Anesthesia Assessment:                           - Prior to the procedure, a History and Physical                            was performed, and patient medications and                            allergies were reviewed. The patient's tolerance of                            previous anesthesia was also reviewed. The risks                            and benefits of the procedure and the sedation                            options and risks were discussed with the patient.                            All questions were answered, and informed consent                            was obtained. Prior Anticoagulants: The patient has                            taken no anticoagulant or antiplatelet agents                            except for aspirin . ASA Grade  Assessment: III - A                            patient with severe systemic disease. After                            reviewing the risks and benefits, the patient was                            deemed in satisfactory condition to undergo the                            procedure.                           After obtaining informed consent, the colonoscope                             was passed under direct vision. Throughout the                            procedure, the patient's blood pressure, pulse, and                            oxygen saturations were monitored continuously. The                            [id number]-HQ190L ([id number]) Olympus colonoscope was                            introduced through the anus and advanced to the the                            cecum, identified by appendiceal orifice and                            ileocecal valve. The colonoscopy was performed                            without difficulty. The patient tolerated the                            procedure well. The quality of the bowel                            preparation was adequate. The ileocecal valve,                            appendiceal orifice, and rectum were photographed. Scope In: 9:36:22 AM Scope Out: 9:54:50 AM Scope Withdrawal Time: 0 hours 11 minutes 55 seconds  Total Procedure Duration: 0 hours 18 minutes 28 seconds  Findings:      External and internal hemorrhoids were found during retroflexion, during       perianal exam and during digital exam. The hemorrhoids were small.      A  few small-mouthed diverticula were found in the sigmoid colon and       ascending colon.      A diminutive polyp was found in the cecum. The polyp was semi-sessile.       Biopsies were taken with a cold forceps for histology. Unfortunately we       thought we took pictures of the cecum and the cecal polyp as well as       some of the diverticuli but they did not save on the computer      The exam was otherwise without abnormality on direct and retroflexion       views. Impression:               - External and internal hemorrhoids.                           - Diverticulosis in the sigmoid colon and in the                            ascending colon.                           - One diminutive polyp in the cecum. Biopsied.                           - The examination was otherwise normal on  direct                            and retroflexion views. Recommendation:           - Patient has a contact number available for                            emergencies. The signs and symptoms of potential                            delayed complications were discussed with the                            patient. Return to normal activities tomorrow.                            Written discharge instructions were provided to the                            patient.                           - Soft diet today.                           - Continue present medications.                           - Await pathology results.                           - Repeat colonoscopy in 5 years for surveillance  based on pathology results.                           - Return to GI office PRN.                           - Telephone GI clinic for pathology results in 1                            week.                           - Telephone GI clinic if symptomatic PRN. Procedure Code(s):        --- Professional ---                           [id number], Colonoscopy, flexible; with biopsy, single                            or multiple Diagnosis Code(s):        --- Professional ---                           Z86.010, Personal history of colonic polyps                           D12.0, Benign neoplasm of cecum CPT copyright 2022 American Medical Association. All rights reserved. The codes documented in this report are preliminary and upon coder review may  be revised to meet current compliance requirements. Oliva Boots, MD 06/11/2024 10:15:13 AM This report has been signed electronically. Number of Addenda: 0

## 2024-06-11 NOTE — Discharge Instructions (Signed)

## 2024-06-11 NOTE — Op Note (Signed)
 [facility] Patient Name: [name] Procedure Date : 06/11/2024 MRN: [id number] Attending MD: Oliva Boots , MD, [id number] Date of Birth: 05/26/1956 CSN: [id number] Age: 68 Admit Type: Outpatient Procedure:                Upper GI endoscopy Indications:              Dysphagia Providers:                Oliva Boots, MD, Gregoria Pierce, RN, Janie                            Billups, Technician Referring MD:              Medicines:                Monitored Anesthesia Care Complications:            No immediate complications. Estimated Blood Loss:     Estimated blood loss: none. Procedure:                Pre-Anesthesia Assessment:                           - Prior to the procedure, a History and Physical                            was performed, and patient medications and                            allergies were reviewed. The patient's tolerance of                            previous anesthesia was also reviewed. The risks                            and benefits of the procedure and the sedation                            options and risks were discussed with the patient.                            All questions were answered, and informed consent                            was obtained. Prior Anticoagulants: The patient has                            taken no anticoagulant or antiplatelet agents                            except for aspirin . ASA Grade Assessment: III - A                            patient with severe systemic disease. After                            reviewing  the risks and benefits, the patient was                            deemed in satisfactory condition to undergo the                            procedure.                           After obtaining informed consent, the endoscope was                            passed under direct vision. Throughout the                            procedure, the patient's blood pressure, pulse, and                             oxygen saturations were monitored continuously. The                            [id number]-[id number] ([id number]) Olympus endoscope was introduced                            through the mouth, and advanced to the third part                            of duodenum. The upper GI endoscopy was                            accomplished without difficulty. The patient                            tolerated the procedure well. Scope In: Scope Out: Findings:      A small hiatal hernia was present.      No stenoses were found. The stenoses were traversed. A guidewire was       placed and the scope was withdrawn. Dilation was performed with a Savary       dilator with no resistance at 15,16 mm. And no blood on the 15 mm       dilator and only 1 tiny drop on the 16      The entire examined stomach was normal.      The duodenal bulb, first portion of the duodenum, second portion of the       duodenum and third portion of the duodenum were normal.      The cardia and gastric fundus were normal on retroflexion. Impression:               - Small hiatal hernia.                           - Esophageal No. Dilated.                           - Normal stomach.                           -  Normal duodenal bulb, first portion of the                            duodenum, second portion of the duodenum and third                            portion of the duodenum.                           - No specimens collected. Recommendation:           - Patient has a contact number available for                            emergencies. The signs and symptoms of potential                            delayed complications were discussed with the                            patient. Return to normal activities tomorrow.                            Written discharge instructions were provided to the                            patient.                           - Soft diet today.                           - Continue present medications. If  swallowing no                            better in 1 to 2 weeks take over-the-counter                            Prilosec once a day and follow-up in 1 month                           - Await pathology results. From colonoscopy done                            today to follow-up                           - Return to GI clinic PRN.                           - Telephone GI clinic for pathology results in 1                            week.                           -  Telephone GI clinic if symptomatic PRN. Procedure Code(s):        --- Professional ---                           [phone], Esophagogastroduodenoscopy, flexible,                            transoral; with insertion of guide wire followed by                            passage of dilator(s) through esophagus over guide                            wire Diagnosis Code(s):        --- Professional ---                           K44.9, Diaphragmatic hernia without obstruction or                            gangrene                           K22.2, Esophageal obstruction                           R13.10, Dysphagia, unspecified CPT copyright 2022 American Medical Association. All rights reserved. The codes documented in this report are preliminary and upon coder review may  be revised to meet current compliance requirements. Oliva Boots, MD 06/11/2024 10:11:15 AM This report has been signed electronically. Number of Addenda: 0

## 2024-06-11 NOTE — Progress Notes (Signed)
 [name] 8:59 AM  Subjective: Patient doing well overall and did have a stent placed last year and is just on aspirin  for that and does have some dysphagia which has been helped by dilation in the past and some vague left-sided discomfort but no other complaints and drank all her prep and we rediscussed the procedure and answered all of her questions  Objective: Vital signs stable afebrile no acute distress exam please see preassessment evaluation  Assessment: Dysphagia and history of colon polyps  Plan: Okay to proceed with colonoscopy and endoscopy with anesthesia assistance  [facility]  office [phone] After 5PM or if no answer call [phone]

## 2024-06-11 NOTE — Anesthesia Procedure Notes (Signed)
 Procedure Name: MAC Date/Time: 06/11/2024 9:14 AM  Performed by: Erlene Powell POUR, CRNAPre-anesthesia Checklist: Patient identified, Emergency Drugs available, Suction available, Patient being monitored and Timeout performed Patient Re-evaluated:Patient Re-evaluated prior to induction Oxygen Delivery Method: Nasal cannula

## 2024-06-11 NOTE — Transfer of Care (Signed)
 Immediate Anesthesia Transfer of Care Note  Patient: [name]  Procedure(s) Performed: COLONOSCOPY EGD (ESOPHAGOGASTRODUODENOSCOPY) EGD, WITH DILATION USING SAVARY-GILLIARD DILATOR OVER GUIDEWIRE POLYPECTOMY, INTESTINE  Patient Location: Endoscopy Unit  Anesthesia Type:MAC  Level of Consciousness: awake and drowsy  Airway & Oxygen Therapy: Patient Spontanous Breathing and Patient connected to nasal cannula oxygen  Post-op Assessment: Report given to RN, Post -op Vital signs reviewed and stable, and Patient moving all extremities  Post vital signs: Reviewed and stable  Last Vitals:  Vitals Value Taken Time  BP 143/82 06/11/24 10:00  Temp    Pulse 81 06/11/24 10:01  Resp 23 06/11/24 10:01  SpO2 99 % 06/11/24 10:01  Vitals shown include unfiled device data.  Last Pain:  Vitals:   06/11/24 0757  TempSrc: Temporal  PainSc: 0-No pain         Complications: No notable events documented.

## 2024-06-11 NOTE — Anesthesia Postprocedure Evaluation (Signed)
 Anesthesia Post Note  Patient: [name]  Procedure(s) Performed: COLONOSCOPY EGD (ESOPHAGOGASTRODUODENOSCOPY) EGD, WITH DILATION USING SAVARY-GILLIARD DILATOR OVER GUIDEWIRE POLYPECTOMY, INTESTINE     Patient location during evaluation: PACU Anesthesia Type: MAC Level of consciousness: awake Pain management: pain level controlled Vital Signs Assessment: post-procedure vital signs reviewed and stable Respiratory status: spontaneous breathing, nonlabored ventilation and respiratory function stable Cardiovascular status: stable and blood pressure returned to baseline Postop Assessment: no apparent nausea or vomiting Anesthetic complications: no   No notable events documented.  Last Vitals:  Vitals:   06/11/24 1010 06/11/24 1020  BP: 136/79 135/78  Pulse: 78 78  Resp: (!) 27 16  Temp:    SpO2: 92% 94%    Last Pain:  Vitals:   06/11/24 1000  TempSrc: Temporal  PainSc: 0-No pain                 Delon Aisha Arch

## 2024-06-12 DIAGNOSIS — H0014 Chalazion left upper eyelid: Secondary | ICD-10-CM | POA: Diagnosis not present

## 2024-06-13 ENCOUNTER — Encounter (HOSPITAL_COMMUNITY): Payer: Self-pay | Admitting: Gastroenterology

## 2024-06-13 LAB — SURGICAL PATHOLOGY

## 2024-06-18 ENCOUNTER — Encounter: Payer: Self-pay | Admitting: Family Medicine

## 2024-06-18 MED ORDER — FUROSEMIDE 40 MG PO TABS: 40.0000 mg | ORAL_TABLET | Freq: Every day | ORAL | 0 refills | Status: AC | PRN

## 2024-06-18 NOTE — Addendum Note (Signed)
 Addended by: Ailine Hefferan D on: 06/18/2024 01:46 PM   Modules accepted: Orders
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
## 2024-06-24 NOTE — Assessment & Plan Note (Signed)
 On Levothyroxine, continue to monitor

## 2024-06-24 NOTE — Assessment & Plan Note (Signed)
 Encouraged to get adequate exercise, calcium and vitamin d intake

## 2024-06-24 NOTE — Assessment & Plan Note (Signed)
 Hydrate and monitor

## 2024-06-24 NOTE — Assessment & Plan Note (Signed)
 [id number] acceptable, minimize simple carbs. Increase exercise as tolerated.

## 2024-06-24 NOTE — Assessment & Plan Note (Signed)
 Encourage heart healthy diet such as MIND or DASH diet, increase exercise, avoid trans fats, simple carbohydrates and processed foods, consider a krill or fish or flaxseed oil cap daily.

## 2024-06-24 NOTE — Assessment & Plan Note (Signed)
 Supplement and monitor

## 2024-06-24 NOTE — Assessment & Plan Note (Signed)
 Patient has tried and been unable to tolerate multiple statins including Simvastatin, Atorvastatin, Rosuvastatin

## 2024-06-25 ENCOUNTER — Ambulatory Visit: Admitting: Family Medicine

## 2024-06-25 ENCOUNTER — Encounter: Payer: Self-pay | Admitting: Family Medicine

## 2024-06-25 VITALS — BP 116/74 | HR 68 | Temp 97.8°F | Resp 16 | Ht 65.0 in | Wt 164.2 lb

## 2024-06-25 DIAGNOSIS — E559 Vitamin D deficiency, unspecified: Secondary | ICD-10-CM

## 2024-06-25 DIAGNOSIS — R739 Hyperglycemia, unspecified: Secondary | ICD-10-CM | POA: Diagnosis not present

## 2024-06-25 DIAGNOSIS — M8580 Other specified disorders of bone density and structure, unspecified site: Secondary | ICD-10-CM | POA: Diagnosis not present

## 2024-06-25 DIAGNOSIS — G720 Drug-induced myopathy: Secondary | ICD-10-CM | POA: Diagnosis not present

## 2024-06-25 DIAGNOSIS — E785 Hyperlipidemia, unspecified: Secondary | ICD-10-CM | POA: Diagnosis not present

## 2024-06-25 DIAGNOSIS — R252 Cramp and spasm: Secondary | ICD-10-CM | POA: Diagnosis not present

## 2024-06-25 DIAGNOSIS — E039 Hypothyroidism, unspecified: Secondary | ICD-10-CM

## 2024-06-25 DIAGNOSIS — T466X5A Adverse effect of antihyperlipidemic and antiarteriosclerotic drugs, initial encounter: Secondary | ICD-10-CM

## 2024-06-25 MED ORDER — CLOBETASOL PROPIONATE 0.05 % EX CREA: 1.0000 | TOPICAL_CREAM | Freq: Two times a day (BID) | CUTANEOUS | 1 refills | Status: AC | PRN

## 2024-06-25 NOTE — Patient Instructions (Addendum)
 CBTinsomnia APP at [employer] compression [employer]  Insomnia Insomnia is a sleep disorder that makes it difficult to fall asleep or stay asleep. Insomnia can cause fatigue, low energy, difficulty concentrating, mood swings, and poor performance at work or school. There are three different ways to classify insomnia: Difficulty falling asleep. Difficulty staying asleep. Waking up too early in the morning. Any type of insomnia can be long-term (chronic) or short-term (acute). Both are common. Short-term insomnia usually lasts for 3 months or less. Chronic insomnia occurs at least three times a week for longer than 3 months. What are the causes? Insomnia may be caused by another condition, situation, or substance, such as: Having certain mental health conditions, such as anxiety and depression. Using caffeine, alcohol, tobacco, or drugs. Having gastrointestinal conditions, such as gastroesophageal reflux disease (GERD). Having certain medical conditions. These include: Asthma. Alzheimer's disease. Stroke. Chronic pain. An overactive thyroid  gland (hyperthyroidism). Other sleep disorders, such as restless legs syndrome and sleep apnea. Menopause. Sometimes, the cause of insomnia may not be known. What increases the risk? Risk factors for insomnia include: Gender. Females are affected more often than males. Age. Insomnia is more common as people get older. Stress and certain medical and mental health conditions. Lack of exercise. Having an irregular work schedule. This may include working night shifts and traveling between different time zones. What are the signs or symptoms? If you have insomnia, the main symptom is having trouble falling asleep or having trouble staying asleep. This may lead to other symptoms, such as: Feeling tired or having low energy. Feeling nervous about going to sleep. Not feeling rested in the morning. Having trouble concentrating. Feeling  irritable, anxious, or depressed. How is this diagnosed? This condition may be diagnosed based on: Your symptoms and medical history. Your health care provider may ask about: Your sleep habits. Any medical conditions you have. Your mental health. A physical exam. How is this treated? Treatment for insomnia depends on the cause. Treatment may focus on treating an underlying condition that is causing the insomnia. Treatment may also include: Medicines to help you sleep. Counseling or therapy. Lifestyle adjustments to help you sleep better. Follow these instructions at home: Eating and drinking  Limit or avoid alcohol, caffeinated beverages, and products that contain nicotine and tobacco, especially close to bedtime. These can disrupt your sleep. Do not eat a large meal or eat spicy foods right before bedtime. This can lead to digestive discomfort that can make it hard for you to sleep. Sleep habits  Keep a sleep diary to help you and your health care provider figure out what could be causing your insomnia. Write down: When you sleep. When you wake up during the night. How well you sleep and how rested you feel the next day. Any side effects of medicines you are taking. What you eat and drink. Make your bedroom a dark, comfortable place where it is easy to fall asleep. Put up shades or blackout curtains to block light from outside. Use a white noise machine to block noise. Keep the temperature cool. Limit screen use before bedtime. This includes: Not watching TV. Not using your smartphone, tablet, or computer. Stick to a routine that includes going to bed and waking up at the same times every day and night. This can help you fall asleep faster. Consider making a quiet activity, such as reading, part of your nighttime routine. Try to avoid taking naps during the day so that you sleep better at night.  Get out of bed if you are still awake after 15 minutes of trying to sleep. Keep the  lights down, but try reading or doing a quiet activity. When you feel sleepy, go back to bed. General instructions Take over-the-counter and prescription medicines only as told by your health care provider. Exercise regularly as told by your health care provider. However, avoid exercising in the hours right before bedtime. Use relaxation techniques to manage stress. Ask your health care provider to suggest some techniques that may work well for you. These may include: Breathing exercises. Routines to release muscle tension. Visualizing peaceful scenes. Make sure that you drive carefully. Do not drive if you feel very sleepy. Keep all follow-up visits. This is important. Contact a health care provider if: You are tired throughout the day. You have trouble in your daily routine due to sleepiness. You continue to have sleep problems, or your sleep problems get worse. Get help right away if: You have thoughts about hurting yourself or someone else. Get help right away if you feel like you may hurt yourself or others, or have thoughts about taking your own life. Go to your nearest emergency room or: Call 911. Call the National Suicide Prevention Lifeline at 680-309-3028 or 988. This is open 24 hours a day. Text the Crisis Text Line at (682) 673-8117. Summary Insomnia is a sleep disorder that makes it difficult to fall asleep or stay asleep. Insomnia can be long-term (chronic) or short-term (acute). Treatment for insomnia depends on the cause. Treatment may focus on treating an underlying condition that is causing the insomnia. Keep a sleep diary to help you and your health care provider figure out what could be causing your insomnia. This information is not intended to replace advice given to you by your health care provider. Make sure you discuss any questions you have with your health care provider. Document Revised: 05/25/2021 Document Reviewed: 05/25/2021 Elsevier Patient Education  2024  [employer].

## 2024-06-26 ENCOUNTER — Encounter: Payer: Self-pay | Admitting: Family Medicine

## 2024-06-26 ENCOUNTER — Ambulatory Visit: Payer: Self-pay | Admitting: Family Medicine

## 2024-06-26 LAB — COMPREHENSIVE METABOLIC PANEL WITH GFR
ALT: 24 U/L (ref 3–35)
AST: 31 U/L (ref 5–37)
Albumin: 4.3 g/dL (ref 3.5–5.2)
Alkaline Phosphatase: 81 U/L (ref 39–117)
BUN: 19 mg/dL (ref 6–23)
CO2: 30 meq/L (ref 19–32)
Calcium: 9 mg/dL (ref 8.4–10.5)
Chloride: 101 meq/L (ref 96–112)
Creatinine, Ser: 1.03 mg/dL (ref 0.40–1.20)
GFR: 56.36 mL/min — ABNORMAL LOW
Glucose, Bld: 83 mg/dL (ref 70–99)
Potassium: 4.2 meq/L (ref 3.5–5.1)
Sodium: 141 meq/L (ref 135–145)
Total Bilirubin: 0.4 mg/dL (ref 0.2–1.2)
Total Protein: 6.8 g/dL (ref 6.0–8.3)

## 2024-06-26 LAB — CBC WITH DIFFERENTIAL/PLATELET
Basophils Absolute: 0.1 K/uL (ref 0.0–0.1)
Basophils Relative: 1.2 % (ref 0.0–3.0)
Eosinophils Absolute: 0.1 K/uL (ref 0.0–0.7)
Eosinophils Relative: 1.9 % (ref 0.0–5.0)
HCT: 42.2 % (ref 36.0–46.0)
Hemoglobin: 14.2 g/dL (ref 12.0–15.0)
Lymphocytes Relative: 28.9 % (ref 12.0–46.0)
Lymphs Abs: 2.2 K/uL (ref 0.7–4.0)
MCHC: 33.7 g/dL (ref 30.0–36.0)
MCV: 96.3 fl (ref 78.0–100.0)
Monocytes Absolute: 0.6 K/uL (ref 0.1–1.0)
Monocytes Relative: 8 % (ref 3.0–12.0)
Neutro Abs: 4.6 K/uL (ref 1.4–7.7)
Neutrophils Relative %: 60 % (ref 43.0–77.0)
Platelets: 277 K/uL (ref 150.0–400.0)
RBC: 4.38 Mil/uL (ref 3.87–5.11)
RDW: 13.7 % (ref 11.5–15.5)
WBC: 7.7 K/uL (ref 4.0–10.5)

## 2024-06-26 LAB — LIPID PANEL
Cholesterol: 225 mg/dL — ABNORMAL HIGH (ref 28–200)
HDL: 61 mg/dL
LDL Cholesterol: 142 mg/dL — ABNORMAL HIGH (ref 10–99)
NonHDL: 163.96
Total CHOL/HDL Ratio: 4
Triglycerides: 108 mg/dL (ref 10.0–149.0)
VLDL: 21.6 mg/dL (ref 0.0–40.0)

## 2024-06-26 LAB — HEMOGLOBIN A1C: Hgb A1c MFr Bld: 5.9 % (ref 4.6–6.5)

## 2024-06-26 LAB — TSH: TSH: 1.06 u[IU]/mL (ref 0.35–5.50)

## 2024-06-26 LAB — VITAMIN D 25 HYDROXY (VIT D DEFICIENCY, FRACTURES): VITD: 27.95 ng/mL — ABNORMAL LOW (ref 30.00–100.00)

## 2024-06-27 NOTE — Progress Notes (Signed)
 Patient reviewed via MyChart.

## 2024-07-04 ENCOUNTER — Encounter: Payer: Self-pay | Admitting: Family Medicine

## 2024-07-04 ENCOUNTER — Other Ambulatory Visit: Payer: Self-pay | Admitting: Family Medicine

## 2024-07-04 MED ORDER — FLUOXETINE HCL 10 MG PO TABS: 5.0000 mg | ORAL_TABLET | Freq: Every day | ORAL | 3 refills | Status: AC

## 2024-07-04 MED ORDER — ALPRAZOLAM 0.5 MG PO TABS: 0.2500 mg | ORAL_TABLET | Freq: Two times a day (BID) | ORAL | 1 refills | Status: AC | PRN

## 2024-07-17 ENCOUNTER — Other Ambulatory Visit: Payer: Self-pay | Admitting: Family Medicine

## 2024-07-17 DIAGNOSIS — Z1231 Encounter for screening mammogram for malignant neoplasm of breast: Secondary | ICD-10-CM

## 2024-07-19 ENCOUNTER — Ambulatory Visit
Admission: RE | Admit: 2024-07-19 | Discharge: 2024-07-19 | Disposition: A | Discharge status: Home / Self Care | Source: Ambulatory Visit | Attending: Family Medicine | Admitting: Family Medicine

## 2024-07-19 DIAGNOSIS — Z1231 Encounter for screening mammogram for malignant neoplasm of breast: Secondary | ICD-10-CM

## 2024-07-27 ENCOUNTER — Telehealth: Admitting: Family Medicine

## 2024-07-27 ENCOUNTER — Encounter: Payer: Self-pay | Admitting: Family Medicine

## 2024-07-27 DIAGNOSIS — J0100 Acute maxillary sinusitis, unspecified: Secondary | ICD-10-CM | POA: Diagnosis not present

## 2024-07-27 MED ORDER — AMOXICILLIN-POT CLAVULANATE 875-125 MG PO TABS: 1.0000 | ORAL_TABLET | Freq: Two times a day (BID) | ORAL | 0 refills | Status: DC

## 2024-07-27 MED ORDER — PREDNISONE 20 MG PO TABS: 40.0000 mg | ORAL_TABLET | Freq: Every day | ORAL | 0 refills | Status: AC
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
## 2024-07-31 ENCOUNTER — Other Ambulatory Visit: Payer: Self-pay | Admitting: Family Medicine

## 2024-07-31 DIAGNOSIS — R928 Other abnormal and inconclusive findings on diagnostic imaging of breast: Secondary | ICD-10-CM

## 2024-08-01 ENCOUNTER — Inpatient Hospital Stay: Admission: RE | Admit: 2024-08-01 | Discharge: 2024-08-01 | Attending: Family Medicine | Admitting: Family Medicine

## 2024-08-01 ENCOUNTER — Other Ambulatory Visit: Payer: Self-pay | Admitting: Family Medicine

## 2024-08-01 ENCOUNTER — Encounter: Payer: Self-pay | Admitting: Family Medicine

## 2024-08-01 DIAGNOSIS — R928 Other abnormal and inconclusive findings on diagnostic imaging of breast: Secondary | ICD-10-CM

## 2024-08-01 DIAGNOSIS — N6489 Other specified disorders of breast: Secondary | ICD-10-CM

## 2024-08-02 ENCOUNTER — Other Ambulatory Visit: Payer: Self-pay | Admitting: Family Medicine

## 2024-08-02 ENCOUNTER — Encounter: Payer: Self-pay | Admitting: Family Medicine

## 2024-08-02 DIAGNOSIS — J329 Chronic sinusitis, unspecified: Secondary | ICD-10-CM

## 2024-08-02 DIAGNOSIS — J301 Allergic rhinitis due to pollen: Secondary | ICD-10-CM

## 2024-08-02 MED ORDER — AZELASTINE HCL 0.1 % NA SOLN: 2.0000 | Freq: Two times a day (BID) | NASAL | 2 refills | Status: AC

## 2024-08-02 MED ORDER — FLUTICASONE PROPIONATE 50 MCG/ACT NA SUSP: 2.0000 | Freq: Every day | NASAL | 2 refills | Status: AC

## 2024-08-02 MED ORDER — DOXYCYCLINE HYCLATE 100 MG PO TABS: 100.0000 mg | ORAL_TABLET | Freq: Two times a day (BID) | ORAL | 0 refills | Status: AC

## 2024-08-06 ENCOUNTER — Encounter

## 2024-08-27 ENCOUNTER — Ambulatory Visit: Admitting: Family Medicine

## 2025-01-09 ENCOUNTER — Ambulatory Visit

## 2025-01-24 ENCOUNTER — Encounter: Admitting: Family Medicine
